# Patient Record
Sex: Female | Born: 1940
Health system: Southern US, Community
[De-identification: ages and names within clinical notes are randomized; demographics above are authoritative.]

## PROBLEM LIST (undated history)

## (undated) DIAGNOSIS — D649 Anemia, unspecified: Secondary | ICD-10-CM

## (undated) DIAGNOSIS — H532 Diplopia: Secondary | ICD-10-CM

## (undated) DIAGNOSIS — G20A1 Parkinson's disease without dyskinesia, without mention of fluctuations: Secondary | ICD-10-CM

## (undated) DIAGNOSIS — E785 Hyperlipidemia, unspecified: Secondary | ICD-10-CM

## (undated) DIAGNOSIS — D472 Monoclonal gammopathy: Secondary | ICD-10-CM

## (undated) DIAGNOSIS — C4490 Unspecified malignant neoplasm of skin, unspecified: Secondary | ICD-10-CM

## (undated) DIAGNOSIS — I341 Nonrheumatic mitral (valve) prolapse: Secondary | ICD-10-CM

## (undated) DIAGNOSIS — I720 Aneurysm of carotid artery: Secondary | ICD-10-CM

## (undated) DIAGNOSIS — T7840XA Allergy, unspecified, initial encounter: Secondary | ICD-10-CM

## (undated) DIAGNOSIS — M1990 Unspecified osteoarthritis, unspecified site: Secondary | ICD-10-CM

## (undated) DIAGNOSIS — C50919 Malignant neoplasm of unspecified site of unspecified female breast: Secondary | ICD-10-CM

## (undated) DIAGNOSIS — H547 Unspecified visual loss: Secondary | ICD-10-CM

## (undated) DIAGNOSIS — G20 Parkinson's disease: Secondary | ICD-10-CM

## (undated) HISTORY — DX: Unspecified osteoarthritis, unspecified site: M19.90

## (undated) HISTORY — DX: Anemia, unspecified: D64.9

## (undated) HISTORY — DX: Diplopia: H53.2

## (undated) HISTORY — DX: Malignant neoplasm of unspecified site of unspecified female breast: C50.919

## (undated) HISTORY — DX: Monoclonal gammopathy: D47.2

## (undated) HISTORY — DX: Aneurysm of carotid artery: I72.0

## (undated) HISTORY — PX: UPPER GASTROINTESTINAL ENDOSCOPY: SHX188

## (undated) HISTORY — PX: COLONOSCOPY: SHX174

## (undated) HISTORY — DX: Allergy, unspecified, initial encounter: T78.40XA

## (undated) HISTORY — DX: Parkinson's disease: G20

## (undated) HISTORY — DX: Parkinson's disease without dyskinesia, without mention of fluctuations: G20.A1

## (undated) HISTORY — DX: Nonrheumatic mitral (valve) prolapse: I34.1

## (undated) HISTORY — DX: Hyperlipidemia, unspecified: E78.5

## (undated) HISTORY — PX: HEMORRHOID SURGERY: SHX153

## (undated) HISTORY — DX: Unspecified malignant neoplasm of skin, unspecified: C44.90

## (undated) HISTORY — DX: Unspecified visual loss: H54.7

## (undated) HISTORY — PX: TOTAL ABDOMINAL HYSTERECTOMY W/ BILATERAL SALPINGOOPHORECTOMY: SHX83

## (undated) HISTORY — PX: SHOULDER SURGERY: SHX246

---

## 1997-06-07 ENCOUNTER — Ambulatory Visit (HOSPITAL_COMMUNITY): Admission: RE | Admit: 1997-06-07 | Discharge: 1997-06-07 | Payer: Self-pay | Admitting: Neurosurgery

## 1999-05-20 ENCOUNTER — Encounter: Payer: Self-pay | Admitting: Gastroenterology

## 1999-05-20 ENCOUNTER — Ambulatory Visit (HOSPITAL_COMMUNITY): Admission: RE | Admit: 1999-05-20 | Discharge: 1999-05-20 | Payer: Self-pay | Admitting: Obstetrics & Gynecology

## 1999-06-24 ENCOUNTER — Other Ambulatory Visit: Admission: RE | Admit: 1999-06-24 | Discharge: 1999-06-24 | Payer: Self-pay | Admitting: Obstetrics and Gynecology

## 1999-07-01 ENCOUNTER — Encounter: Payer: Self-pay | Admitting: Gastroenterology

## 1999-07-01 ENCOUNTER — Other Ambulatory Visit: Admission: RE | Admit: 1999-07-01 | Discharge: 1999-07-01 | Payer: Self-pay | Admitting: Gastroenterology

## 1999-07-01 ENCOUNTER — Encounter (INDEPENDENT_AMBULATORY_CARE_PROVIDER_SITE_OTHER): Payer: Self-pay | Admitting: Specialist

## 1999-08-03 ENCOUNTER — Encounter: Payer: Self-pay | Admitting: Obstetrics and Gynecology

## 1999-08-03 ENCOUNTER — Ambulatory Visit (HOSPITAL_COMMUNITY): Admission: RE | Admit: 1999-08-03 | Discharge: 1999-08-03 | Payer: Self-pay | Admitting: Obstetrics and Gynecology

## 1999-10-12 ENCOUNTER — Ambulatory Visit (HOSPITAL_COMMUNITY): Admission: RE | Admit: 1999-10-12 | Discharge: 1999-10-12 | Payer: Self-pay | Admitting: Obstetrics and Gynecology

## 1999-10-12 ENCOUNTER — Encounter: Payer: Self-pay | Admitting: Obstetrics and Gynecology

## 1999-11-27 ENCOUNTER — Encounter: Payer: Self-pay | Admitting: Obstetrics and Gynecology

## 1999-12-02 ENCOUNTER — Inpatient Hospital Stay (HOSPITAL_COMMUNITY): Admission: RE | Admit: 1999-12-02 | Discharge: 1999-12-06 | Payer: Self-pay | Admitting: Obstetrics and Gynecology

## 1999-12-02 ENCOUNTER — Encounter (INDEPENDENT_AMBULATORY_CARE_PROVIDER_SITE_OTHER): Payer: Self-pay | Admitting: Specialist

## 2000-01-26 HISTORY — PX: APPENDECTOMY: SHX54

## 2000-01-26 HISTORY — PX: SALPINGOOPHORECTOMY: SHX82

## 2003-01-26 HISTORY — PX: CARDIAC CATHETERIZATION: SHX172

## 2003-01-29 ENCOUNTER — Inpatient Hospital Stay (HOSPITAL_BASED_OUTPATIENT_CLINIC_OR_DEPARTMENT_OTHER): Admission: RE | Admit: 2003-01-29 | Discharge: 2003-01-29 | Payer: Self-pay | Admitting: *Deleted

## 2004-07-07 ENCOUNTER — Ambulatory Visit: Payer: Self-pay | Admitting: Internal Medicine

## 2004-07-14 ENCOUNTER — Encounter: Payer: Self-pay | Admitting: Internal Medicine

## 2004-07-14 ENCOUNTER — Ambulatory Visit: Payer: Self-pay | Admitting: Internal Medicine

## 2004-08-28 ENCOUNTER — Ambulatory Visit: Payer: Self-pay | Admitting: Internal Medicine

## 2004-08-31 ENCOUNTER — Ambulatory Visit: Payer: Self-pay | Admitting: Internal Medicine

## 2004-09-15 ENCOUNTER — Ambulatory Visit: Payer: Self-pay | Admitting: Internal Medicine

## 2004-09-24 ENCOUNTER — Ambulatory Visit: Payer: Self-pay | Admitting: Internal Medicine

## 2004-10-08 ENCOUNTER — Ambulatory Visit (HOSPITAL_COMMUNITY): Admission: RE | Admit: 2004-10-08 | Discharge: 2004-10-08 | Payer: Self-pay | Admitting: Internal Medicine

## 2004-11-13 ENCOUNTER — Encounter (INDEPENDENT_AMBULATORY_CARE_PROVIDER_SITE_OTHER): Payer: Self-pay | Admitting: Specialist

## 2004-11-13 ENCOUNTER — Ambulatory Visit (HOSPITAL_COMMUNITY): Admission: RE | Admit: 2004-11-13 | Discharge: 2004-11-13 | Payer: Self-pay | Admitting: Internal Medicine

## 2004-11-13 ENCOUNTER — Ambulatory Visit: Payer: Self-pay | Admitting: Internal Medicine

## 2004-11-17 ENCOUNTER — Ambulatory Visit: Payer: Self-pay | Admitting: Internal Medicine

## 2004-11-25 ENCOUNTER — Ambulatory Visit: Payer: Self-pay | Admitting: Internal Medicine

## 2005-02-16 ENCOUNTER — Ambulatory Visit: Payer: Self-pay | Admitting: Internal Medicine

## 2005-06-11 ENCOUNTER — Ambulatory Visit: Payer: Self-pay | Admitting: Internal Medicine

## 2005-06-15 LAB — CBC WITH DIFFERENTIAL/PLATELET
BASO%: 0.6 % (ref 0.0–2.0)
Basophils Absolute: 0 10*3/uL (ref 0.0–0.1)
EOS%: 5.8 % (ref 0.0–7.0)
Eosinophils Absolute: 0.4 10*3/uL (ref 0.0–0.5)
HCT: 37.4 % (ref 34.8–46.6)
HGB: 12.7 g/dL (ref 11.6–15.9)
LYMPH%: 41.6 % (ref 14.0–48.0)
MCH: 33.3 pg (ref 26.0–34.0)
MCHC: 34.1 g/dL (ref 32.0–36.0)
MCV: 97.8 fL (ref 81.0–101.0)
MONO#: 0.3 10*3/uL (ref 0.1–0.9)
MONO%: 4.1 % (ref 0.0–13.0)
NEUT#: 3.4 10*3/uL (ref 1.5–6.5)
NEUT%: 47.9 % (ref 39.6–76.8)
Platelets: 230 10*3/uL (ref 145–400)
RBC: 3.82 10*6/uL (ref 3.70–5.32)
RDW: 13.7 % (ref 11.3–14.5)
WBC: 7.1 10*3/uL (ref 3.9–10.0)
lymph#: 3 10*3/uL (ref 0.9–3.3)

## 2005-06-16 LAB — COMPREHENSIVE METABOLIC PANEL
ALT: 11 U/L (ref 0–40)
AST: 24 U/L (ref 0–37)
Albumin: 4.2 g/dL (ref 3.5–5.2)
Alkaline Phosphatase: 68 U/L (ref 39–117)
BUN: 9 mg/dL (ref 6–23)
CO2: 28 mEq/L (ref 19–32)
Calcium: 9.6 mg/dL (ref 8.4–10.5)
Chloride: 103 mEq/L (ref 96–112)
Creatinine, Ser: 0.6 mg/dL (ref 0.4–1.2)
Glucose, Bld: 90 mg/dL (ref 70–99)
Potassium: 3.9 mEq/L (ref 3.5–5.3)
Sodium: 139 mEq/L (ref 135–145)
Total Bilirubin: 0.5 mg/dL (ref 0.3–1.2)
Total Protein: 7.8 g/dL (ref 6.0–8.3)

## 2005-06-16 LAB — IGG, IGA, IGM
IgA: 52 mg/dL — ABNORMAL LOW (ref 68–378)
IgG (Immunoglobin G), Serum: 1940 mg/dL — ABNORMAL HIGH (ref 694–1618)
IgM, Serum: 64 mg/dL (ref 60–263)

## 2005-06-16 LAB — BETA 2 MICROGLOBULIN, SERUM: Beta-2 Microglobulin: 1.69 mg/L (ref 1.01–1.73)

## 2005-06-16 LAB — LACTATE DEHYDROGENASE: LDH: 134 U/L (ref 94–250)

## 2005-09-09 ENCOUNTER — Ambulatory Visit: Payer: Self-pay | Admitting: Internal Medicine

## 2005-09-14 LAB — CBC WITH DIFFERENTIAL/PLATELET
BASO%: 1.5 % (ref 0.0–2.0)
Basophils Absolute: 0.1 10*3/uL (ref 0.0–0.1)
EOS%: 7.6 % — ABNORMAL HIGH (ref 0.0–7.0)
Eosinophils Absolute: 0.5 10*3/uL (ref 0.0–0.5)
HCT: 35.7 % (ref 34.8–46.6)
HGB: 12.5 g/dL (ref 11.6–15.9)
LYMPH%: 41.3 % (ref 14.0–48.0)
MCH: 33.7 pg (ref 26.0–34.0)
MCHC: 34.9 g/dL (ref 32.0–36.0)
MCV: 96.5 fL (ref 81.0–101.0)
MONO#: 0.4 10*3/uL (ref 0.1–0.9)
MONO%: 6.1 % (ref 0.0–13.0)
NEUT#: 2.7 10*3/uL (ref 1.5–6.5)
NEUT%: 43.5 % (ref 39.6–76.8)
Platelets: 233 10*3/uL (ref 145–400)
RBC: 3.7 10*6/uL (ref 3.70–5.32)
RDW: 11.4 % (ref 11.3–14.5)
WBC: 6.3 10*3/uL (ref 3.9–10.0)
lymph#: 2.6 10*3/uL (ref 0.9–3.3)

## 2005-09-15 LAB — COMPREHENSIVE METABOLIC PANEL
ALT: 13 U/L (ref 0–40)
AST: 21 U/L (ref 0–37)
Albumin: 3.9 g/dL (ref 3.5–5.2)
Alkaline Phosphatase: 66 U/L (ref 39–117)
BUN: 7 mg/dL (ref 6–23)
CO2: 28 mEq/L (ref 19–32)
Calcium: 9.6 mg/dL (ref 8.4–10.5)
Chloride: 107 mEq/L (ref 96–112)
Creatinine, Ser: 0.64 mg/dL (ref 0.40–1.20)
Glucose, Bld: 93 mg/dL (ref 70–99)
Potassium: 3.7 mEq/L (ref 3.5–5.3)
Sodium: 139 mEq/L (ref 135–145)
Total Bilirubin: 0.4 mg/dL (ref 0.3–1.2)
Total Protein: 7.5 g/dL (ref 6.0–8.3)

## 2005-09-15 LAB — BETA 2 MICROGLOBULIN, SERUM: Beta-2 Microglobulin: 1.16 mg/L (ref 1.01–1.73)

## 2005-09-15 LAB — IGG, IGA, IGM
IgA: 51 mg/dL — ABNORMAL LOW (ref 68–378)
IgG (Immunoglobin G), Serum: 1840 mg/dL — ABNORMAL HIGH (ref 694–1618)
IgM, Serum: 55 mg/dL — ABNORMAL LOW (ref 60–263)

## 2005-09-15 LAB — LACTATE DEHYDROGENASE: LDH: 134 U/L (ref 94–250)

## 2006-03-14 ENCOUNTER — Ambulatory Visit: Payer: Self-pay | Admitting: Internal Medicine

## 2006-03-17 LAB — CBC WITH DIFFERENTIAL/PLATELET
BASO%: 1.2 % (ref 0.0–2.0)
Basophils Absolute: 0.1 10*3/uL (ref 0.0–0.1)
EOS%: 6 % (ref 0.0–7.0)
Eosinophils Absolute: 0.4 10*3/uL (ref 0.0–0.5)
HCT: 39.9 % (ref 34.8–46.6)
HGB: 13.7 g/dL (ref 11.6–15.9)
LYMPH%: 42.9 % (ref 14.0–48.0)
MCH: 33 pg (ref 26.0–34.0)
MCHC: 34.3 g/dL (ref 32.0–36.0)
MCV: 96.3 fL (ref 81.0–101.0)
MONO#: 0.4 10*3/uL (ref 0.1–0.9)
MONO%: 5.8 % (ref 0.0–13.0)
NEUT#: 2.7 10*3/uL (ref 1.5–6.5)
NEUT%: 44.1 % (ref 39.6–76.8)
Platelets: 260 10*3/uL (ref 145–400)
RBC: 4.14 10*6/uL (ref 3.70–5.32)
RDW: 13.4 % (ref 11.3–14.5)
WBC: 6.2 10*3/uL (ref 3.9–10.0)
lymph#: 2.7 10*3/uL (ref 0.9–3.3)

## 2006-03-18 LAB — COMPREHENSIVE METABOLIC PANEL
ALT: 11 U/L (ref 0–35)
AST: 22 U/L (ref 0–37)
Albumin: 4.4 g/dL (ref 3.5–5.2)
Alkaline Phosphatase: 72 U/L (ref 39–117)
BUN: 6 mg/dL (ref 6–23)
CO2: 28 mEq/L (ref 19–32)
Calcium: 9.7 mg/dL (ref 8.4–10.5)
Chloride: 101 mEq/L (ref 96–112)
Creatinine, Ser: 0.8 mg/dL (ref 0.40–1.20)
Glucose, Bld: 91 mg/dL (ref 70–99)
Potassium: 3.9 mEq/L (ref 3.5–5.3)
Sodium: 140 mEq/L (ref 135–145)
Total Bilirubin: 0.6 mg/dL (ref 0.3–1.2)
Total Protein: 8.2 g/dL (ref 6.0–8.3)

## 2006-03-18 LAB — IGG, IGA, IGM
IgA: 68 mg/dL (ref 68–378)
IgG (Immunoglobin G), Serum: 2260 mg/dL — ABNORMAL HIGH (ref 694–1618)
IgM, Serum: 66 mg/dL (ref 60–263)

## 2006-03-18 LAB — LACTATE DEHYDROGENASE: LDH: 136 U/L (ref 94–250)

## 2006-03-18 LAB — BETA 2 MICROGLOBULIN, SERUM: Beta-2 Microglobulin: 1.55 mg/L (ref 1.01–1.73)

## 2006-04-22 ENCOUNTER — Ambulatory Visit: Payer: Self-pay | Admitting: Internal Medicine

## 2006-04-28 ENCOUNTER — Emergency Department (HOSPITAL_COMMUNITY): Admission: EM | Admit: 2006-04-28 | Discharge: 2006-04-28 | Payer: Self-pay | Admitting: Emergency Medicine

## 2006-05-01 ENCOUNTER — Emergency Department (HOSPITAL_COMMUNITY): Admission: EM | Admit: 2006-05-01 | Discharge: 2006-05-01 | Payer: Self-pay | Admitting: Emergency Medicine

## 2006-05-11 ENCOUNTER — Ambulatory Visit: Payer: Self-pay | Admitting: Internal Medicine

## 2006-05-11 LAB — CONVERTED CEMR LAB
Hgb A1c MFr Bld: 6 % (ref 4.6–6.0)
TSH: 1.46 microintl units/mL (ref 0.35–5.50)

## 2006-07-11 ENCOUNTER — Ambulatory Visit: Payer: Self-pay | Admitting: Internal Medicine

## 2006-07-13 LAB — CBC WITH DIFFERENTIAL/PLATELET
BASO%: 1.1 % (ref 0.0–2.0)
Basophils Absolute: 0.1 10*3/uL (ref 0.0–0.1)
EOS%: 6.3 % (ref 0.0–7.0)
Eosinophils Absolute: 0.4 10*3/uL (ref 0.0–0.5)
HCT: 36.5 % (ref 34.8–46.6)
HGB: 12.7 g/dL (ref 11.6–15.9)
LYMPH%: 42.8 % (ref 14.0–48.0)
MCH: 33.4 pg (ref 26.0–34.0)
MCHC: 34.8 g/dL (ref 32.0–36.0)
MCV: 96.1 fL (ref 81.0–101.0)
MONO#: 0.3 10*3/uL (ref 0.1–0.9)
MONO%: 5.1 % (ref 0.0–13.0)
NEUT#: 2.8 10*3/uL (ref 1.5–6.5)
NEUT%: 44.7 % (ref 39.6–76.8)
Platelets: 251 10*3/uL (ref 145–400)
RBC: 3.8 10*6/uL (ref 3.70–5.32)
RDW: 14.1 % (ref 11.3–14.5)
WBC: 6.2 10*3/uL (ref 3.9–10.0)
lymph#: 2.6 10*3/uL (ref 0.9–3.3)

## 2006-07-14 LAB — IGG, IGA, IGM
IgA: 65 mg/dL — ABNORMAL LOW (ref 68–378)
IgG (Immunoglobin G), Serum: 2010 mg/dL — ABNORMAL HIGH (ref 694–1618)
IgM, Serum: 59 mg/dL — ABNORMAL LOW (ref 60–263)

## 2006-07-14 LAB — COMPREHENSIVE METABOLIC PANEL
ALT: 15 U/L (ref 0–35)
AST: 22 U/L (ref 0–37)
Albumin: 4.1 g/dL (ref 3.5–5.2)
Alkaline Phosphatase: 82 U/L (ref 39–117)
BUN: 8 mg/dL (ref 6–23)
CO2: 25 mEq/L (ref 19–32)
Calcium: 9.5 mg/dL (ref 8.4–10.5)
Chloride: 104 mEq/L (ref 96–112)
Creatinine, Ser: 0.71 mg/dL (ref 0.40–1.20)
Glucose, Bld: 87 mg/dL (ref 70–99)
Potassium: 4.1 mEq/L (ref 3.5–5.3)
Sodium: 141 mEq/L (ref 135–145)
Total Bilirubin: 0.5 mg/dL (ref 0.3–1.2)
Total Protein: 7.8 g/dL (ref 6.0–8.3)

## 2006-07-14 LAB — LACTATE DEHYDROGENASE: LDH: 142 U/L (ref 94–250)

## 2006-07-14 LAB — BETA 2 MICROGLOBULIN, SERUM: Beta-2 Microglobulin: 1.21 mg/L (ref 1.01–1.73)

## 2006-07-20 ENCOUNTER — Ambulatory Visit (HOSPITAL_COMMUNITY): Admission: RE | Admit: 2006-07-20 | Discharge: 2006-07-20 | Payer: Self-pay | Admitting: Internal Medicine

## 2006-08-26 ENCOUNTER — Ambulatory Visit: Payer: Self-pay | Admitting: Internal Medicine

## 2006-08-26 LAB — CONVERTED CEMR LAB
BUN: 4 mg/dL — ABNORMAL LOW (ref 6–23)
Creatinine, Ser: 0.7 mg/dL (ref 0.4–1.2)

## 2006-09-02 ENCOUNTER — Ambulatory Visit: Payer: Self-pay | Admitting: Cardiology

## 2006-09-08 ENCOUNTER — Encounter (INDEPENDENT_AMBULATORY_CARE_PROVIDER_SITE_OTHER): Payer: Self-pay | Admitting: *Deleted

## 2006-09-23 ENCOUNTER — Ambulatory Visit: Payer: Self-pay | Admitting: Internal Medicine

## 2006-09-23 ENCOUNTER — Telehealth (INDEPENDENT_AMBULATORY_CARE_PROVIDER_SITE_OTHER): Payer: Self-pay | Admitting: *Deleted

## 2006-10-11 ENCOUNTER — Ambulatory Visit: Payer: Self-pay | Admitting: Internal Medicine

## 2006-10-13 LAB — CBC WITH DIFFERENTIAL/PLATELET
BASO%: 1.3 % (ref 0.0–2.0)
Basophils Absolute: 0.1 10*3/uL (ref 0.0–0.1)
EOS%: 2.7 % (ref 0.0–7.0)
Eosinophils Absolute: 0.1 10*3/uL (ref 0.0–0.5)
HCT: 33.5 % — ABNORMAL LOW (ref 34.8–46.6)
HGB: 11.5 g/dL — ABNORMAL LOW (ref 11.6–15.9)
LYMPH%: 47.7 % (ref 14.0–48.0)
MCH: 32.9 pg (ref 26.0–34.0)
MCHC: 34.2 g/dL (ref 32.0–36.0)
MCV: 96.3 fL (ref 81.0–101.0)
MONO#: 0.2 10*3/uL (ref 0.1–0.9)
MONO%: 4.5 % (ref 0.0–13.0)
NEUT#: 1.9 10*3/uL (ref 1.5–6.5)
NEUT%: 43.8 % (ref 39.6–76.8)
Platelets: 272 10*3/uL (ref 145–400)
RBC: 3.48 10*6/uL — ABNORMAL LOW (ref 3.70–5.32)
RDW: 14.2 % (ref 11.3–14.5)
WBC: 4.4 10*3/uL (ref 3.9–10.0)
lymph#: 2.1 10*3/uL (ref 0.9–3.3)

## 2006-10-18 LAB — COMPREHENSIVE METABOLIC PANEL
ALT: 11 U/L (ref 0–35)
AST: 26 U/L (ref 0–37)
Albumin: 3.8 g/dL (ref 3.5–5.2)
Alkaline Phosphatase: 81 U/L (ref 39–117)
BUN: 4 mg/dL — ABNORMAL LOW (ref 6–23)
CO2: 25 mEq/L (ref 19–32)
Calcium: 8.9 mg/dL (ref 8.4–10.5)
Chloride: 106 mEq/L (ref 96–112)
Creatinine, Ser: 0.67 mg/dL (ref 0.40–1.20)
Glucose, Bld: 107 mg/dL — ABNORMAL HIGH (ref 70–99)
Potassium: 3.7 mEq/L (ref 3.5–5.3)
Sodium: 140 mEq/L (ref 135–145)
Total Bilirubin: 0.5 mg/dL (ref 0.3–1.2)
Total Protein: 7.3 g/dL (ref 6.0–8.3)

## 2006-10-18 LAB — LACTATE DEHYDROGENASE: LDH: 149 U/L (ref 94–250)

## 2006-10-18 LAB — IGG, IGA, IGM
IgA: 55 mg/dL — ABNORMAL LOW (ref 68–378)
IgG (Immunoglobin G), Serum: 2000 mg/dL — ABNORMAL HIGH (ref 694–1618)
IgM, Serum: 60 mg/dL (ref 60–263)

## 2006-10-18 LAB — KAPPA/LAMBDA LIGHT CHAINS
Kappa free light chain: 3.28 mg/dL — ABNORMAL HIGH (ref 0.33–1.94)
Lambda Free Lght Chn: <0.8 mg/dL (ref 0.57–2.63)

## 2006-10-18 LAB — BETA 2 MICROGLOBULIN, SERUM: Beta-2 Microglobulin: 1.33 mg/L (ref 1.01–1.73)

## 2006-10-20 ENCOUNTER — Encounter: Payer: Self-pay | Admitting: Internal Medicine

## 2007-01-09 ENCOUNTER — Telehealth: Payer: Self-pay | Admitting: Internal Medicine

## 2007-01-10 ENCOUNTER — Ambulatory Visit: Payer: Self-pay | Admitting: Internal Medicine

## 2007-01-10 LAB — CONVERTED CEMR LAB
Bilirubin Urine: NEGATIVE
Glucose, Urine, Semiquant: NEGATIVE
Ketones, urine, test strip: NEGATIVE
Nitrite: NEGATIVE
Protein, U semiquant: NEGATIVE
Specific Gravity, Urine: 1.02
Urobilinogen, UA: NEGATIVE
WBC Urine, dipstick: NEGATIVE
pH: 6.5

## 2007-01-12 ENCOUNTER — Encounter: Payer: Self-pay | Admitting: Internal Medicine

## 2007-01-13 ENCOUNTER — Ambulatory Visit: Payer: Self-pay | Admitting: Internal Medicine

## 2007-01-17 ENCOUNTER — Telehealth (INDEPENDENT_AMBULATORY_CARE_PROVIDER_SITE_OTHER): Payer: Self-pay | Admitting: *Deleted

## 2007-01-31 LAB — CBC WITH DIFFERENTIAL/PLATELET
BASO%: 1.4 % (ref 0.0–2.0)
Basophils Absolute: 0.1 10*3/uL (ref 0.0–0.1)
EOS%: 4.7 % (ref 0.0–7.0)
Eosinophils Absolute: 0.3 10*3/uL (ref 0.0–0.5)
HCT: 36.1 % (ref 34.8–46.6)
HGB: 12.4 g/dL (ref 11.6–15.9)
LYMPH%: 46.2 % (ref 14.0–48.0)
MCH: 32.7 pg (ref 26.0–34.0)
MCHC: 34.2 g/dL (ref 32.0–36.0)
MCV: 95.5 fL (ref 81.0–101.0)
MONO#: 0.3 10*3/uL (ref 0.1–0.9)
MONO%: 5.7 % (ref 0.0–13.0)
NEUT#: 2.2 10*3/uL (ref 1.5–6.5)
NEUT%: 42 % (ref 39.6–76.8)
Platelets: 268 10*3/uL (ref 145–400)
RBC: 3.79 10*6/uL (ref 3.70–5.32)
RDW: 14.1 % (ref 11.3–14.5)
WBC: 5.3 10*3/uL (ref 3.9–10.0)
lymph#: 2.5 10*3/uL (ref 0.9–3.3)

## 2007-02-02 LAB — COMPREHENSIVE METABOLIC PANEL
ALT: 13 U/L (ref 0–35)
AST: 21 U/L (ref 0–37)
Albumin: 4.1 g/dL (ref 3.5–5.2)
Alkaline Phosphatase: 87 U/L (ref 39–117)
BUN: 10 mg/dL (ref 6–23)
CO2: 27 mEq/L (ref 19–32)
Calcium: 9.4 mg/dL (ref 8.4–10.5)
Chloride: 104 mEq/L (ref 96–112)
Creatinine, Ser: 0.71 mg/dL (ref 0.40–1.20)
Glucose, Bld: 95 mg/dL (ref 70–99)
Potassium: 4.1 mEq/L (ref 3.5–5.3)
Sodium: 143 mEq/L (ref 135–145)
Total Bilirubin: 0.5 mg/dL (ref 0.3–1.2)
Total Protein: 7.9 g/dL (ref 6.0–8.3)

## 2007-02-02 LAB — KAPPA/LAMBDA LIGHT CHAINS
Kappa free light chain: 2.88 mg/dL — ABNORMAL HIGH (ref 0.33–1.94)
Kappa:Lambda Ratio: 2.36 — ABNORMAL HIGH (ref 0.26–1.65)
Lambda Free Lght Chn: 1.22 mg/dL (ref 0.57–2.63)

## 2007-02-02 LAB — IMMUNOFIXATION ELECTROPHORESIS
IgA: 59 mg/dL — ABNORMAL LOW (ref 68–378)
IgG (Immunoglobin G), Serum: 2130 mg/dL — ABNORMAL HIGH (ref 694–1618)
IgM, Serum: 65 mg/dL (ref 60–263)
Total Protein, Serum Electrophoresis: 7.9 g/dL (ref 6.0–8.3)

## 2007-02-02 LAB — BETA 2 MICROGLOBULIN, SERUM: Beta-2 Microglobulin: 1.4 mg/L (ref 1.01–1.73)

## 2007-02-02 LAB — LACTATE DEHYDROGENASE: LDH: 149 U/L (ref 94–250)

## 2007-02-07 ENCOUNTER — Ambulatory Visit (HOSPITAL_COMMUNITY): Admission: RE | Admit: 2007-02-07 | Discharge: 2007-02-07 | Payer: Self-pay | Admitting: Internal Medicine

## 2007-04-27 ENCOUNTER — Ambulatory Visit: Payer: Self-pay | Admitting: Internal Medicine

## 2007-05-01 LAB — CBC WITH DIFFERENTIAL/PLATELET
BASO%: 0.3 % (ref 0.0–2.0)
Basophils Absolute: 0 10*3/uL (ref 0.0–0.1)
EOS%: 3.9 % (ref 0.0–7.0)
Eosinophils Absolute: 0.2 10*3/uL (ref 0.0–0.5)
HCT: 36.5 % (ref 34.8–46.6)
HGB: 12.8 g/dL (ref 11.6–15.9)
LYMPH%: 41.4 % (ref 14.0–48.0)
MCH: 33.4 pg (ref 26.0–34.0)
MCHC: 35 g/dL (ref 32.0–36.0)
MCV: 95.2 fL (ref 81.0–101.0)
MONO#: 0.2 10*3/uL (ref 0.1–0.9)
MONO%: 4.8 % (ref 0.0–13.0)
NEUT#: 2.6 10*3/uL (ref 1.5–6.5)
NEUT%: 49.6 % (ref 39.6–76.8)
Platelets: 224 10*3/uL (ref 145–400)
RBC: 3.84 10*6/uL (ref 3.70–5.32)
RDW: 13.9 % (ref 11.3–14.5)
WBC: 5.2 10*3/uL (ref 3.9–10.0)
lymph#: 2.1 10*3/uL (ref 0.9–3.3)

## 2007-05-02 LAB — COMPREHENSIVE METABOLIC PANEL
ALT: 8 U/L (ref 0–35)
AST: 16 U/L (ref 0–37)
Albumin: 4.3 g/dL (ref 3.5–5.2)
Alkaline Phosphatase: 61 U/L (ref 39–117)
BUN: 9 mg/dL (ref 6–23)
CO2: 25 mEq/L (ref 19–32)
Calcium: 9.1 mg/dL (ref 8.4–10.5)
Chloride: 106 mEq/L (ref 96–112)
Creatinine, Ser: 0.74 mg/dL (ref 0.40–1.20)
Glucose, Bld: 89 mg/dL (ref 70–99)
Potassium: 3.6 mEq/L (ref 3.5–5.3)
Sodium: 143 mEq/L (ref 135–145)
Total Bilirubin: 0.5 mg/dL (ref 0.3–1.2)
Total Protein: 7.5 g/dL (ref 6.0–8.3)

## 2007-05-02 LAB — KAPPA/LAMBDA LIGHT CHAINS
Kappa free light chain: 3.07 mg/dL — ABNORMAL HIGH (ref 0.33–1.94)
Kappa:Lambda Ratio: 2.77 — ABNORMAL HIGH (ref 0.26–1.65)
Lambda Free Lght Chn: 1.11 mg/dL (ref 0.57–2.63)

## 2007-05-02 LAB — LACTATE DEHYDROGENASE: LDH: 133 U/L (ref 94–250)

## 2007-05-02 LAB — IGG, IGA, IGM
IgA: 48 mg/dL — ABNORMAL LOW (ref 68–378)
IgG (Immunoglobin G), Serum: 2080 mg/dL — ABNORMAL HIGH (ref 694–1618)
IgM, Serum: 57 mg/dL — ABNORMAL LOW (ref 60–263)

## 2007-05-02 LAB — BETA 2 MICROGLOBULIN, SERUM: Beta-2 Microglobulin: 1.37 mg/L (ref 1.01–1.73)

## 2007-07-10 ENCOUNTER — Ambulatory Visit: Payer: Self-pay | Admitting: Internal Medicine

## 2007-07-10 DIAGNOSIS — D472 Monoclonal gammopathy: Secondary | ICD-10-CM | POA: Insufficient documentation

## 2007-07-10 DIAGNOSIS — R5383 Other fatigue: Secondary | ICD-10-CM

## 2007-07-10 DIAGNOSIS — R5381 Other malaise: Secondary | ICD-10-CM | POA: Insufficient documentation

## 2007-07-10 DIAGNOSIS — E049 Nontoxic goiter, unspecified: Secondary | ICD-10-CM | POA: Insufficient documentation

## 2007-07-10 DIAGNOSIS — E785 Hyperlipidemia, unspecified: Secondary | ICD-10-CM | POA: Insufficient documentation

## 2007-07-10 DIAGNOSIS — D649 Anemia, unspecified: Secondary | ICD-10-CM | POA: Insufficient documentation

## 2007-07-10 DIAGNOSIS — J069 Acute upper respiratory infection, unspecified: Secondary | ICD-10-CM | POA: Insufficient documentation

## 2007-07-12 ENCOUNTER — Ambulatory Visit: Payer: Self-pay | Admitting: Internal Medicine

## 2007-07-13 ENCOUNTER — Encounter: Admission: RE | Admit: 2007-07-13 | Discharge: 2007-07-13 | Payer: Self-pay | Admitting: Internal Medicine

## 2007-07-25 ENCOUNTER — Encounter (INDEPENDENT_AMBULATORY_CARE_PROVIDER_SITE_OTHER): Payer: Self-pay | Admitting: *Deleted

## 2007-07-25 ENCOUNTER — Telehealth (INDEPENDENT_AMBULATORY_CARE_PROVIDER_SITE_OTHER): Payer: Self-pay | Admitting: *Deleted

## 2007-07-25 LAB — CONVERTED CEMR LAB
ALT: 17 units/L (ref 0–35)
AST: 25 units/L (ref 0–37)
Albumin: 3.6 g/dL (ref 3.5–5.2)
Alkaline Phosphatase: 67 units/L (ref 39–117)
BUN: 7 mg/dL (ref 6–23)
Basophils Absolute: 0 10*3/uL (ref 0.0–0.1)
Basophils Relative: 0.6 % (ref 0.0–1.0)
Bilirubin, Direct: 0.1 mg/dL (ref 0.0–0.3)
CO2: 32 meq/L (ref 19–32)
Calcium: 9 mg/dL (ref 8.4–10.5)
Chloride: 108 meq/L (ref 96–112)
Cholesterol: 196 mg/dL (ref 0–200)
Creatinine, Ser: 0.7 mg/dL (ref 0.4–1.2)
Eosinophils Absolute: 0.3 10*3/uL (ref 0.0–0.7)
Eosinophils Relative: 4.9 % (ref 0.0–5.0)
Free T4: 0.7 ng/dL (ref 0.6–1.6)
GFR calc Af Amer: 108 mL/min
GFR calc non Af Amer: 89 mL/min
Glucose, Bld: 98 mg/dL (ref 70–99)
HCT: 38.1 % (ref 36.0–46.0)
HDL: 69.2 mg/dL (ref >39.0)
Hemoglobin: 13 g/dL (ref 12.0–15.0)
LDL Cholesterol: 113 mg/dL — ABNORMAL HIGH (ref 0–99)
Lymphocytes Relative: 33.8 % (ref 12.0–46.0)
MCHC: 34.2 g/dL (ref 30.0–36.0)
MCV: 99.4 fL (ref 78.0–100.0)
Monocytes Absolute: 0.3 10*3/uL (ref 0.1–1.0)
Monocytes Relative: 5 % (ref 3.0–12.0)
Neutro Abs: 3.3 10*3/uL (ref 1.4–7.7)
Neutrophils Relative %: 55.7 % (ref 43.0–77.0)
Platelets: 271 10*3/uL (ref 150–400)
Potassium: 4.1 meq/L (ref 3.5–5.1)
RBC: 3.83 M/uL — ABNORMAL LOW (ref 3.87–5.11)
RDW: 13.1 % (ref 11.5–14.6)
Sodium: 143 meq/L (ref 135–145)
TSH: 6.98 microintl units/mL — ABNORMAL HIGH (ref 0.35–5.50)
Total Bilirubin: 0.8 mg/dL (ref 0.3–1.2)
Total CHOL/HDL Ratio: 2.8
Total Protein: 7.2 g/dL (ref 6.0–8.3)
Triglycerides: 70 mg/dL (ref 0–149)
VLDL: 14 mg/dL (ref 0–40)
WBC: 5.9 10*3/uL (ref 4.5–10.5)

## 2007-07-26 ENCOUNTER — Ambulatory Visit: Payer: Self-pay | Admitting: Internal Medicine

## 2007-07-31 LAB — CBC WITH DIFFERENTIAL/PLATELET
BASO%: 1.8 % (ref 0.0–2.0)
Basophils Absolute: 0.1 10*3/uL (ref 0.0–0.1)
EOS%: 3.8 % (ref 0.0–7.0)
Eosinophils Absolute: 0.3 10*3/uL (ref 0.0–0.5)
HCT: 37.1 % (ref 34.8–46.6)
HGB: 12.8 g/dL (ref 11.6–15.9)
LYMPH%: 37.2 % (ref 14.0–48.0)
MCH: 33.3 pg (ref 26.0–34.0)
MCHC: 34.4 g/dL (ref 32.0–36.0)
MCV: 96.9 fL (ref 81.0–101.0)
MONO#: 0.4 10*3/uL (ref 0.1–0.9)
MONO%: 5.3 % (ref 0.0–13.0)
NEUT#: 3.5 10*3/uL (ref 1.5–6.5)
NEUT%: 51.9 % (ref 39.6–76.8)
Platelets: 254 10*3/uL (ref 145–400)
RBC: 3.83 10*6/uL (ref 3.70–5.32)
RDW: 12.8 % (ref 11.3–14.5)
WBC: 6.8 10*3/uL (ref 3.9–10.0)
lymph#: 2.5 10*3/uL (ref 0.9–3.3)

## 2007-08-01 LAB — COMPREHENSIVE METABOLIC PANEL
ALT: 12 U/L (ref 0–35)
AST: 18 U/L (ref 0–37)
Albumin: 4.2 g/dL (ref 3.5–5.2)
Alkaline Phosphatase: 75 U/L (ref 39–117)
BUN: 7 mg/dL (ref 6–23)
CO2: 26 mEq/L (ref 19–32)
Calcium: 9.3 mg/dL (ref 8.4–10.5)
Chloride: 106 mEq/L (ref 96–112)
Creatinine, Ser: 0.67 mg/dL (ref 0.40–1.20)
Glucose, Bld: 106 mg/dL — ABNORMAL HIGH (ref 70–99)
Potassium: 3.7 mEq/L (ref 3.5–5.3)
Sodium: 142 mEq/L (ref 135–145)
Total Bilirubin: 0.5 mg/dL (ref 0.3–1.2)
Total Protein: 7.6 g/dL (ref 6.0–8.3)

## 2007-08-01 LAB — KAPPA/LAMBDA LIGHT CHAINS
Kappa free light chain: 2.96 mg/dL — ABNORMAL HIGH (ref 0.33–1.94)
Kappa:Lambda Ratio: 3.4 — ABNORMAL HIGH (ref 0.26–1.65)
Lambda Free Lght Chn: 0.87 mg/dL (ref 0.57–2.63)

## 2007-08-01 LAB — IGG, IGA, IGM
IgA: 55 mg/dL — ABNORMAL LOW (ref 68–378)
IgG (Immunoglobin G), Serum: 2120 mg/dL — ABNORMAL HIGH (ref 694–1618)
IgM, Serum: 65 mg/dL (ref 60–263)

## 2007-08-01 LAB — BETA 2 MICROGLOBULIN, SERUM: Beta-2 Microglobulin: 1.39 mg/L (ref 1.01–1.73)

## 2007-08-07 ENCOUNTER — Encounter: Payer: Self-pay | Admitting: Internal Medicine

## 2007-12-06 ENCOUNTER — Ambulatory Visit: Payer: Self-pay | Admitting: Internal Medicine

## 2007-12-08 LAB — CBC WITH DIFFERENTIAL/PLATELET
BASO%: 0.7 % (ref 0.0–2.0)
Basophils Absolute: 0 10*3/uL (ref 0.0–0.1)
EOS%: 4.6 % (ref 0.0–7.0)
Eosinophils Absolute: 0.2 10*3/uL (ref 0.0–0.5)
HCT: 37.9 % (ref 34.8–46.6)
HGB: 12.9 g/dL (ref 11.6–15.9)
LYMPH%: 38.7 % (ref 14.0–48.0)
MCH: 33.1 pg (ref 26.0–34.0)
MCHC: 34 g/dL (ref 32.0–36.0)
MCV: 97.5 fL (ref 81.0–101.0)
MONO#: 0.3 10*3/uL (ref 0.1–0.9)
MONO%: 6.2 % (ref 0.0–13.0)
NEUT#: 2.4 10*3/uL (ref 1.5–6.5)
NEUT%: 49.8 % (ref 39.6–76.8)
Platelets: 238 10*3/uL (ref 145–400)
RBC: 3.89 10*6/uL (ref 3.70–5.32)
RDW: 13.7 % (ref 11.3–14.5)
WBC: 4.9 10*3/uL (ref 3.9–10.0)
lymph#: 1.9 10*3/uL (ref 0.9–3.3)

## 2007-12-11 LAB — LACTATE DEHYDROGENASE: LDH: 139 U/L (ref 94–250)

## 2007-12-11 LAB — COMPREHENSIVE METABOLIC PANEL
ALT: 10 U/L (ref 0–35)
AST: 19 U/L (ref 0–37)
Albumin: 4.1 g/dL (ref 3.5–5.2)
Alkaline Phosphatase: 70 U/L (ref 39–117)
BUN: 10 mg/dL (ref 6–23)
CO2: 20 mEq/L (ref 19–32)
Calcium: 9.3 mg/dL (ref 8.4–10.5)
Chloride: 106 mEq/L (ref 96–112)
Creatinine, Ser: 0.59 mg/dL (ref 0.40–1.20)
Glucose, Bld: 100 mg/dL — ABNORMAL HIGH (ref 70–99)
Potassium: 4 mEq/L (ref 3.5–5.3)
Sodium: 141 mEq/L (ref 135–145)
Total Bilirubin: 0.6 mg/dL (ref 0.3–1.2)
Total Protein: 7.2 g/dL (ref 6.0–8.3)

## 2007-12-11 LAB — IGG, IGA, IGM
IgA: 58 mg/dL — ABNORMAL LOW (ref 68–378)
IgG (Immunoglobin G), Serum: 1920 mg/dL — ABNORMAL HIGH (ref 694–1618)
IgM, Serum: 55 mg/dL — ABNORMAL LOW (ref 60–263)

## 2007-12-11 LAB — KAPPA/LAMBDA LIGHT CHAINS
Kappa free light chain: 6.32 mg/dL — ABNORMAL HIGH (ref 0.33–1.94)
Kappa:Lambda Ratio: 6.38 — ABNORMAL HIGH (ref 0.26–1.65)
Lambda Free Lght Chn: 0.99 mg/dL (ref 0.57–2.63)

## 2007-12-11 LAB — BETA 2 MICROGLOBULIN, SERUM: Beta-2 Microglobulin: 1.14 mg/L (ref 1.01–1.73)

## 2007-12-15 ENCOUNTER — Encounter: Payer: Self-pay | Admitting: Internal Medicine

## 2008-04-01 ENCOUNTER — Ambulatory Visit: Payer: Self-pay | Admitting: Internal Medicine

## 2008-04-05 LAB — CBC WITH DIFFERENTIAL/PLATELET
BASO%: 0.6 % (ref 0.0–2.0)
Basophils Absolute: 0 10*3/uL (ref 0.0–0.1)
EOS%: 2.4 % (ref 0.0–7.0)
Eosinophils Absolute: 0.1 10*3/uL (ref 0.0–0.5)
HCT: 38.7 % (ref 34.8–46.6)
HGB: 13 g/dL (ref 11.6–15.9)
LYMPH%: 49.4 % (ref 14.0–49.7)
MCH: 32.9 pg (ref 25.1–34.0)
MCHC: 33.7 g/dL (ref 31.5–36.0)
MCV: 97.5 fL (ref 79.5–101.0)
MONO#: 0.4 10*3/uL (ref 0.1–0.9)
MONO%: 10.3 % (ref 0.0–14.0)
NEUT#: 1.5 10*3/uL (ref 1.5–6.5)
NEUT%: 37.3 % — ABNORMAL LOW (ref 38.4–76.8)
Platelets: 288 10*3/uL (ref 145–400)
RBC: 3.96 10*6/uL (ref 3.70–5.45)
RDW: 14.3 % (ref 11.2–14.5)
WBC: 4.1 10*3/uL (ref 3.9–10.3)
lymph#: 2 10*3/uL (ref 0.9–3.3)

## 2008-04-08 LAB — KAPPA/LAMBDA LIGHT CHAINS
Kappa free light chain: 4.66 mg/dL — ABNORMAL HIGH (ref 0.33–1.94)
Kappa:Lambda Ratio: 5.97 — ABNORMAL HIGH (ref 0.26–1.65)
Lambda Free Lght Chn: 0.78 mg/dL (ref 0.57–2.63)

## 2008-04-08 LAB — COMPREHENSIVE METABOLIC PANEL
ALT: 8 U/L (ref 0–35)
AST: 14 U/L (ref 0–37)
Albumin: 3.9 g/dL (ref 3.5–5.2)
Alkaline Phosphatase: 95 U/L (ref 39–117)
BUN: 7 mg/dL (ref 6–23)
CO2: 27 mEq/L (ref 19–32)
Calcium: 9.1 mg/dL (ref 8.4–10.5)
Chloride: 107 mEq/L (ref 96–112)
Creatinine, Ser: 0.73 mg/dL (ref 0.40–1.20)
Glucose, Bld: 88 mg/dL (ref 70–99)
Potassium: 4.6 mEq/L (ref 3.5–5.3)
Sodium: 142 mEq/L (ref 135–145)
Total Bilirubin: 0.3 mg/dL (ref 0.3–1.2)
Total Protein: 7.5 g/dL (ref 6.0–8.3)

## 2008-04-08 LAB — IGG, IGA, IGM
IgA: 60 mg/dL — ABNORMAL LOW (ref 68–378)
IgG (Immunoglobin G), Serum: 1880 mg/dL — ABNORMAL HIGH (ref 694–1618)
IgM, Serum: 61 mg/dL (ref 60–263)

## 2008-04-08 LAB — BETA 2 MICROGLOBULIN, SERUM: Beta-2 Microglobulin: 2.27 mg/L — ABNORMAL HIGH (ref 1.01–1.73)

## 2008-04-08 LAB — LACTATE DEHYDROGENASE: LDH: 120 U/L (ref 94–250)

## 2008-05-01 ENCOUNTER — Encounter: Payer: Self-pay | Admitting: Internal Medicine

## 2008-06-28 ENCOUNTER — Ambulatory Visit: Payer: Self-pay | Admitting: Internal Medicine

## 2008-06-28 DIAGNOSIS — R1319 Other dysphagia: Secondary | ICD-10-CM | POA: Insufficient documentation

## 2008-06-28 DIAGNOSIS — R143 Flatulence: Secondary | ICD-10-CM

## 2008-06-28 DIAGNOSIS — R142 Eructation: Secondary | ICD-10-CM

## 2008-06-28 DIAGNOSIS — R350 Frequency of micturition: Secondary | ICD-10-CM | POA: Insufficient documentation

## 2008-06-28 DIAGNOSIS — I951 Orthostatic hypotension: Secondary | ICD-10-CM | POA: Insufficient documentation

## 2008-06-28 DIAGNOSIS — R141 Gas pain: Secondary | ICD-10-CM | POA: Insufficient documentation

## 2008-06-28 DIAGNOSIS — R42 Dizziness and giddiness: Secondary | ICD-10-CM | POA: Insufficient documentation

## 2008-06-28 DIAGNOSIS — R1031 Right lower quadrant pain: Secondary | ICD-10-CM | POA: Insufficient documentation

## 2008-07-02 ENCOUNTER — Telehealth (INDEPENDENT_AMBULATORY_CARE_PROVIDER_SITE_OTHER): Payer: Self-pay | Admitting: *Deleted

## 2008-07-03 LAB — CONVERTED CEMR LAB
Basophils Absolute: 0 10*3/uL (ref 0.0–0.1)
Basophils Relative: 0.6 % (ref 0.0–3.0)
Eosinophils Absolute: 0.3 10*3/uL (ref 0.0–0.7)
Eosinophils Relative: 4.4 % (ref 0.0–5.0)
HCT: 37.6 % (ref 36.0–46.0)
Hemoglobin: 12.9 g/dL (ref 12.0–15.0)
Lymphocytes Relative: 42.1 % (ref 12.0–46.0)
Lymphs Abs: 2.5 10*3/uL (ref 0.7–4.0)
MCHC: 34.2 g/dL (ref 30.0–36.0)
MCV: 98.1 fL (ref 78.0–100.0)
Monocytes Absolute: 0.3 10*3/uL (ref 0.1–1.0)
Monocytes Relative: 5.2 % (ref 3.0–12.0)
Neutro Abs: 2.8 10*3/uL (ref 1.4–7.7)
Neutrophils Relative %: 47.7 % (ref 43.0–77.0)
Platelets: 237 10*3/uL (ref 150.0–400.0)
RBC: 3.84 M/uL — ABNORMAL LOW (ref 3.87–5.11)
RDW: 13.4 % (ref 11.5–14.6)
WBC: 5.9 10*3/uL (ref 4.5–10.5)

## 2008-07-04 ENCOUNTER — Encounter: Payer: Self-pay | Admitting: Internal Medicine

## 2008-07-09 ENCOUNTER — Ambulatory Visit: Payer: Self-pay | Admitting: Internal Medicine

## 2008-07-09 LAB — CONVERTED CEMR LAB
OCCULT 1: NEGATIVE
OCCULT 2: NEGATIVE
OCCULT 3: NEGATIVE

## 2008-08-01 ENCOUNTER — Ambulatory Visit: Payer: Self-pay | Admitting: Gastroenterology

## 2008-08-01 DIAGNOSIS — K219 Gastro-esophageal reflux disease without esophagitis: Secondary | ICD-10-CM | POA: Insufficient documentation

## 2008-08-01 DIAGNOSIS — R1310 Dysphagia, unspecified: Secondary | ICD-10-CM | POA: Insufficient documentation

## 2008-08-08 ENCOUNTER — Ambulatory Visit: Payer: Self-pay | Admitting: Gastroenterology

## 2008-09-13 ENCOUNTER — Ambulatory Visit: Payer: Self-pay | Admitting: Gastroenterology

## 2008-10-23 ENCOUNTER — Ambulatory Visit: Payer: Self-pay | Admitting: Internal Medicine

## 2008-10-25 LAB — CBC WITH DIFFERENTIAL/PLATELET
BASO%: 0.6 % (ref 0.0–2.0)
Basophils Absolute: 0 10*3/uL (ref 0.0–0.1)
EOS%: 4.7 % (ref 0.0–7.0)
Eosinophils Absolute: 0.3 10*3/uL (ref 0.0–0.5)
HCT: 36.7 % (ref 34.8–46.6)
HGB: 12.5 g/dL (ref 11.6–15.9)
LYMPH%: 46.6 % (ref 14.0–49.7)
MCH: 33.5 pg (ref 25.1–34.0)
MCHC: 34 g/dL (ref 31.5–36.0)
MCV: 98.4 fL (ref 79.5–101.0)
MONO#: 0.3 10*3/uL (ref 0.1–0.9)
MONO%: 5.3 % (ref 0.0–14.0)
NEUT#: 2.4 10*3/uL (ref 1.5–6.5)
NEUT%: 42.8 % (ref 38.4–76.8)
Platelets: 210 10*3/uL (ref 145–400)
RBC: 3.73 10*6/uL (ref 3.70–5.45)
RDW: 14.2 % (ref 11.2–14.5)
WBC: 5.7 10*3/uL (ref 3.9–10.3)
lymph#: 2.6 10*3/uL (ref 0.9–3.3)

## 2008-10-28 LAB — IGG, IGA, IGM
IgA: 53 mg/dL — ABNORMAL LOW (ref 68–378)
IgG (Immunoglobin G), Serum: 1810 mg/dL — ABNORMAL HIGH (ref 694–1618)
IgM, Serum: 51 mg/dL — ABNORMAL LOW (ref 60–263)

## 2008-10-28 LAB — COMPREHENSIVE METABOLIC PANEL
ALT: 10 U/L (ref 0–35)
AST: 17 U/L (ref 0–37)
Albumin: 3.8 g/dL (ref 3.5–5.2)
Alkaline Phosphatase: 67 U/L (ref 39–117)
BUN: 8 mg/dL (ref 6–23)
CO2: 25 mEq/L (ref 19–32)
Calcium: 8.9 mg/dL (ref 8.4–10.5)
Chloride: 107 mEq/L (ref 96–112)
Creatinine, Ser: 0.74 mg/dL (ref 0.40–1.20)
Glucose, Bld: 94 mg/dL (ref 70–99)
Potassium: 4.4 mEq/L (ref 3.5–5.3)
Sodium: 141 mEq/L (ref 135–145)
Total Bilirubin: 0.5 mg/dL (ref 0.3–1.2)
Total Protein: 6.9 g/dL (ref 6.0–8.3)

## 2008-10-28 LAB — LACTATE DEHYDROGENASE: LDH: 130 U/L (ref 94–250)

## 2008-10-28 LAB — KAPPA/LAMBDA LIGHT CHAINS
Kappa free light chain: 2.54 mg/dL — ABNORMAL HIGH (ref 0.33–1.94)
Kappa:Lambda Ratio: 4.98 — ABNORMAL HIGH (ref 0.26–1.65)
Lambda Free Lght Chn: 0.51 mg/dL — ABNORMAL LOW (ref 0.57–2.63)

## 2008-10-28 LAB — BETA 2 MICROGLOBULIN, SERUM: Beta-2 Microglobulin: 1.36 mg/L (ref 1.01–1.73)

## 2008-10-30 ENCOUNTER — Encounter: Payer: Self-pay | Admitting: Internal Medicine

## 2009-02-21 ENCOUNTER — Ambulatory Visit: Payer: Self-pay | Admitting: Internal Medicine

## 2009-04-25 ENCOUNTER — Ambulatory Visit: Payer: Self-pay | Admitting: Internal Medicine

## 2009-04-25 LAB — CBC WITH DIFFERENTIAL/PLATELET
BASO%: 0.6 % (ref 0.0–2.0)
Basophils Absolute: 0 10*3/uL (ref 0.0–0.1)
EOS%: 4.6 % (ref 0.0–7.0)
Eosinophils Absolute: 0.3 10*3/uL (ref 0.0–0.5)
HCT: 36.6 % (ref 34.8–46.6)
HGB: 12.5 g/dL (ref 11.6–15.9)
LYMPH%: 31.2 % (ref 14.0–49.7)
MCH: 33.9 pg (ref 25.1–34.0)
MCHC: 34 g/dL (ref 31.5–36.0)
MCV: 99.7 fL (ref 79.5–101.0)
MONO#: 0.4 10*3/uL (ref 0.1–0.9)
MONO%: 5.8 % (ref 0.0–14.0)
NEUT#: 4.2 10*3/uL (ref 1.5–6.5)
NEUT%: 57.8 % (ref 38.4–76.8)
Platelets: 248 10*3/uL (ref 145–400)
RBC: 3.68 10*6/uL — ABNORMAL LOW (ref 3.70–5.45)
RDW: 14.6 % — ABNORMAL HIGH (ref 11.2–14.5)
WBC: 7.2 10*3/uL (ref 3.9–10.3)
lymph#: 2.2 10*3/uL (ref 0.9–3.3)

## 2009-04-28 LAB — BETA 2 MICROGLOBULIN, SERUM: Beta-2 Microglobulin: 1.29 mg/L (ref 1.01–1.73)

## 2009-04-28 LAB — COMPREHENSIVE METABOLIC PANEL
ALT: 27 U/L (ref 0–35)
AST: 41 U/L — ABNORMAL HIGH (ref 0–37)
Albumin: 3.9 g/dL (ref 3.5–5.2)
Alkaline Phosphatase: 65 U/L (ref 39–117)
BUN: 5 mg/dL — ABNORMAL LOW (ref 6–23)
CO2: 22 mEq/L (ref 19–32)
Calcium: 9.3 mg/dL (ref 8.4–10.5)
Chloride: 106 mEq/L (ref 96–112)
Creatinine, Ser: 0.69 mg/dL (ref 0.40–1.20)
Glucose, Bld: 98 mg/dL (ref 70–99)
Potassium: 4.1 mEq/L (ref 3.5–5.3)
Sodium: 140 mEq/L (ref 135–145)
Total Bilirubin: 0.4 mg/dL (ref 0.3–1.2)
Total Protein: 7.1 g/dL (ref 6.0–8.3)

## 2009-04-28 LAB — IGG, IGA, IGM
IgA: 44 mg/dL — ABNORMAL LOW (ref 68–378)
IgG (Immunoglobin G), Serum: 1600 mg/dL (ref 694–1618)
IgM, Serum: 48 mg/dL — ABNORMAL LOW (ref 60–263)

## 2009-04-28 LAB — KAPPA/LAMBDA LIGHT CHAINS
Kappa free light chain: 3.22 mg/dL — ABNORMAL HIGH (ref 0.33–1.94)
Kappa:Lambda Ratio: 2.95 — ABNORMAL HIGH (ref 0.26–1.65)
Lambda Free Lght Chn: 1.09 mg/dL (ref 0.57–2.63)

## 2009-04-28 LAB — LACTATE DEHYDROGENASE: LDH: 152 U/L (ref 94–250)

## 2009-04-30 ENCOUNTER — Encounter: Payer: Self-pay | Admitting: Internal Medicine

## 2009-09-08 ENCOUNTER — Ambulatory Visit: Payer: Self-pay | Admitting: Internal Medicine

## 2009-09-08 DIAGNOSIS — R0609 Other forms of dyspnea: Secondary | ICD-10-CM | POA: Insufficient documentation

## 2009-09-08 DIAGNOSIS — R0989 Other specified symptoms and signs involving the circulatory and respiratory systems: Secondary | ICD-10-CM

## 2009-09-10 LAB — CONVERTED CEMR LAB
Basophils Absolute: 0 10*3/uL (ref 0.0–0.1)
Basophils Relative: 0.8 % (ref 0.0–3.0)
Eosinophils Absolute: 0.1 10*3/uL (ref 0.0–0.7)
Eosinophils Relative: 3.2 % (ref 0.0–5.0)
HCT: 37.2 % (ref 36.0–46.0)
Hemoglobin: 12.4 g/dL (ref 12.0–15.0)
Lymphocytes Relative: 55.3 % — ABNORMAL HIGH (ref 12.0–46.0)
Lymphs Abs: 2.4 10*3/uL (ref 0.7–4.0)
MCHC: 33.5 g/dL (ref 30.0–36.0)
MCV: 100 fL (ref 78.0–100.0)
Monocytes Absolute: 0.2 10*3/uL (ref 0.1–1.0)
Monocytes Relative: 4.4 % (ref 3.0–12.0)
Neutro Abs: 1.6 10*3/uL (ref 1.4–7.7)
Neutrophils Relative %: 36.3 % — ABNORMAL LOW (ref 43.0–77.0)
Platelets: 239 10*3/uL (ref 150.0–400.0)
RBC: 3.72 M/uL — ABNORMAL LOW (ref 3.87–5.11)
RDW: 14.6 % (ref 11.5–14.6)
WBC: 4.4 10*3/uL — ABNORMAL LOW (ref 4.5–10.5)

## 2009-09-18 ENCOUNTER — Encounter (INDEPENDENT_AMBULATORY_CARE_PROVIDER_SITE_OTHER): Payer: Self-pay | Admitting: *Deleted

## 2009-09-18 ENCOUNTER — Ambulatory Visit (HOSPITAL_COMMUNITY): Admission: RE | Admit: 2009-09-18 | Discharge: 2009-09-18 | Payer: Self-pay | Admitting: Internal Medicine

## 2009-10-01 ENCOUNTER — Encounter (INDEPENDENT_AMBULATORY_CARE_PROVIDER_SITE_OTHER): Payer: Self-pay | Admitting: *Deleted

## 2009-10-01 ENCOUNTER — Telehealth: Payer: Self-pay | Admitting: Gastroenterology

## 2009-10-02 ENCOUNTER — Ambulatory Visit: Payer: Self-pay | Admitting: Gastroenterology

## 2009-10-07 ENCOUNTER — Ambulatory Visit: Payer: Self-pay | Admitting: Gastroenterology

## 2009-10-15 ENCOUNTER — Telehealth (INDEPENDENT_AMBULATORY_CARE_PROVIDER_SITE_OTHER): Payer: Self-pay | Admitting: *Deleted

## 2009-10-22 ENCOUNTER — Ambulatory Visit: Payer: Self-pay | Admitting: Internal Medicine

## 2009-10-22 ENCOUNTER — Ambulatory Visit: Payer: Self-pay | Admitting: Gastroenterology

## 2009-10-22 LAB — CBC WITH DIFFERENTIAL/PLATELET
BASO%: 0.7 % (ref 0.0–2.0)
Basophils Absolute: 0 10*3/uL (ref 0.0–0.1)
EOS%: 3.8 % (ref 0.0–7.0)
Eosinophils Absolute: 0.2 10*3/uL (ref 0.0–0.5)
HCT: 36.4 % (ref 34.8–46.6)
HGB: 12.5 g/dL (ref 11.6–15.9)
LYMPH%: 40.2 % (ref 14.0–49.7)
MCH: 33.7 pg (ref 25.1–34.0)
MCHC: 34.3 g/dL (ref 31.5–36.0)
MCV: 98.3 fL (ref 79.5–101.0)
MONO#: 0.3 10*3/uL (ref 0.1–0.9)
MONO%: 5.9 % (ref 0.0–14.0)
NEUT#: 2.8 10*3/uL (ref 1.5–6.5)
NEUT%: 49.4 % (ref 38.4–76.8)
Platelets: 261 10*3/uL (ref 145–400)
RBC: 3.7 10*6/uL (ref 3.70–5.45)
RDW: 13.7 % (ref 11.2–14.5)
WBC: 5.7 10*3/uL (ref 3.9–10.3)
lymph#: 2.3 10*3/uL (ref 0.9–3.3)

## 2009-10-23 LAB — COMPREHENSIVE METABOLIC PANEL
ALT: 13 U/L (ref 0–35)
AST: 24 U/L (ref 0–37)
Albumin: 3.8 g/dL (ref 3.5–5.2)
Alkaline Phosphatase: 58 U/L (ref 39–117)
BUN: 7 mg/dL (ref 6–23)
CO2: 27 mEq/L (ref 19–32)
Calcium: 9.1 mg/dL (ref 8.4–10.5)
Chloride: 103 mEq/L (ref 96–112)
Creatinine, Ser: 0.84 mg/dL (ref 0.40–1.20)
Glucose, Bld: 89 mg/dL (ref 70–99)
Potassium: 4.5 mEq/L (ref 3.5–5.3)
Sodium: 139 mEq/L (ref 135–145)
Total Bilirubin: 0.4 mg/dL (ref 0.3–1.2)
Total Protein: 6.8 g/dL (ref 6.0–8.3)

## 2009-10-23 LAB — BETA 2 MICROGLOBULIN, SERUM: Beta-2 Microglobulin: 1.53 mg/L (ref 1.01–1.73)

## 2009-10-23 LAB — LACTATE DEHYDROGENASE: LDH: 124 U/L (ref 94–250)

## 2009-10-23 LAB — KAPPA/LAMBDA LIGHT CHAINS
Kappa free light chain: 3.25 mg/dL — ABNORMAL HIGH (ref 0.33–1.94)
Kappa:Lambda Ratio: 4.17 — ABNORMAL HIGH (ref 0.26–1.65)
Lambda Free Lght Chn: 0.78 mg/dL (ref 0.57–2.63)

## 2009-10-23 LAB — IGG, IGA, IGM
IgA: 44 mg/dL — ABNORMAL LOW (ref 68–378)
IgG (Immunoglobin G), Serum: 1520 mg/dL (ref 694–1618)
IgM, Serum: 45 mg/dL — ABNORMAL LOW (ref 60–263)

## 2009-10-24 ENCOUNTER — Telehealth: Payer: Self-pay | Admitting: Gastroenterology

## 2009-10-29 ENCOUNTER — Encounter: Payer: Self-pay | Admitting: Internal Medicine

## 2009-11-03 ENCOUNTER — Ambulatory Visit: Payer: Self-pay | Admitting: Gastroenterology

## 2010-01-14 ENCOUNTER — Encounter: Payer: Self-pay | Admitting: Internal Medicine

## 2010-01-28 ENCOUNTER — Encounter: Payer: Self-pay | Admitting: Internal Medicine

## 2010-02-09 ENCOUNTER — Encounter: Payer: Self-pay | Admitting: Internal Medicine

## 2010-02-11 ENCOUNTER — Other Ambulatory Visit: Payer: Self-pay

## 2010-02-11 ENCOUNTER — Encounter: Payer: Self-pay | Admitting: Internal Medicine

## 2010-02-19 ENCOUNTER — Encounter: Payer: Self-pay | Admitting: Internal Medicine

## 2010-02-20 ENCOUNTER — Encounter
Admission: RE | Admit: 2010-02-20 | Discharge: 2010-02-20 | Payer: Self-pay | Source: Home / Self Care | Attending: Radiology | Admitting: Radiology

## 2010-02-24 ENCOUNTER — Other Ambulatory Visit (HOSPITAL_COMMUNITY): Payer: Self-pay | Admitting: *Deleted

## 2010-02-24 ENCOUNTER — Other Ambulatory Visit (HOSPITAL_COMMUNITY): Payer: Self-pay | Admitting: General Surgery

## 2010-02-24 DIAGNOSIS — C50919 Malignant neoplasm of unspecified site of unspecified female breast: Secondary | ICD-10-CM

## 2010-02-24 NOTE — Letter (Signed)
Summary: [facility] Instructions  [facility]  [address] [place], [place] [address]   Phone: [phone]  Fax: [phone]       [name]    05-07-1940    MRN: [id number]        Procedure Day /Date:MONDAY 11/03/2009     Arrival Time:2:30PM     Procedure Time:3:30PM     Location of Procedure:                    X  [facility] (4th Floor)                       PREPARATION FOR COLONOSCOPY WITH MOVIPREP   Starting 5 days prior to your procedure 10/29/2009 do not eat nuts, seeds, popcorn, corn, beans, peas,  salads, or any raw vegetables.  Do not take any fiber supplements (e.g. Metamucil, Citrucel, and Benefiber).  THE DAY BEFORE YOUR PROCEDURE         DATE: 11/02/2009  DAY: SUNDAY  1.  Drink clear liquids the entire day-NO SOLID FOOD  2.  Do not drink anything colored red or purple.  Avoid juices with pulp.  No orange juice.  3.  Drink at least 64 oz. (8 glasses) of fluid/clear liquids during the day to prevent dehydration and help the prep work efficiently.  CLEAR LIQUIDS INCLUDE: Water Jello Ice Popsicles Tea (sugar ok, no milk/cream) Powdered fruit flavored drinks Coffee (sugar ok, no milk/cream) Gatorade Juice: apple, white grape, white cranberry  Lemonade Clear bullion, consomm, broth Carbonated beverages (any kind) Strained chicken noodle soup Hard Candy                             4.  In the morning, mix first dose of MoviPrep solution:    Empty 1 Pouch A and 1 Pouch B into the disposable container    Add lukewarm drinking water to the top line of the container. Mix to dissolve    Refrigerate (mixed solution should be used within 24 hrs)  5.  Begin drinking the prep at 5:00 p.m. The MoviPrep container is divided by 4 marks.   Every 15 minutes drink the solution down to the next mark (approximately 8 oz) until the full liter is complete.   6.  Follow completed prep with 16 oz of clear liquid of your choice (Nothing red  or purple).  Continue to drink clear liquids until bedtime.  7.  Before going to bed, mix second dose of MoviPrep solution:    Empty 1 Pouch A and 1 Pouch B into the disposable container    Add lukewarm drinking water to the top line of the container. Mix to dissolve    Refrigerate  THE DAY OF YOUR PROCEDURE      DATE:11/03/2009 DAY: MONDAY  Beginning at10:30AM (5 hours before procedure):         1. Every 15 minutes, drink the solution down to the next mark (approx 8 oz) until the full liter is complete.  2. Follow completed prep with 16 oz. of clear liquid of your choice.    3. You may drink clear liquids until 1:30PM(2 HOURS BEFORE PROCEDURE).   MEDICATION INSTRUCTIONS  Unless otherwise instructed, you should take regular prescription medications with a small sip of water   as early as possible the morning of your procedure.        OTHER INSTRUCTIONS  You will need a responsible adult at least 69 years of age to accompany you and drive you home.   This person must remain in the waiting room during your procedure.  Wear loose fitting clothing that is easily removed.  Leave jewelry and other valuables at home.  However, you may wish to bring a book to read or  an iPod/MP3 player to listen to music as you wait for your procedure to start.  Remove all body piercing jewelry and leave at home.  Total time from sign-in until discharge is approximately 2-3 hours.  You should go home directly after your procedure and rest.  You can resume normal activities the  day after your procedure.  The day of your procedure you should not:   Drive   Make legal decisions   Operate machinery   Drink alcohol   Return to work  You will receive specific instructions about eating, activities and medications before you leave.    The above instructions have been reviewed and explained to me by   _______________________    I fully understand and can verbalize these  instructions _____________________________ Date _________

## 2010-02-24 NOTE — Letter (Signed)
[facility]   Imported By: Lanelle Bal 05/23/2009 11:44:11  _____________________________________________________________________  External Attachment:    Type:   Image     Comment:   External Document

## 2010-02-24 NOTE — Letter (Signed)
Summary: [facility]  [facility]   Imported By: Lanelle Bal 11/15/2008 10:57:04  _____________________________________________________________________  External Attachment:    Type:   Image     Comment:   External Document

## 2010-02-24 NOTE — Progress Notes (Signed)
Summary: [facility]  Phone Note Outgoing Call   Call placed by: Doristine Devoid,  January 17, 2007 2:47 PM Call placed to: Patient Details for Reason: DISCUSS URINE CX Summary of Call: INFORMED PT URINE CULTURE NEG. BUT IF HAVING SX MED CAN BE CALLED IN TO PHARMACY  Follow-up for Phone Call        pt uses [facility], [phone] ..................................................................Marland KitchenCharolette Child  January 17, 2007 4:20 PM  Additional Follow-up for Phone Call Additional follow up Details #1::        dr. hopper  scripited nitrofurantoin monohyd macro 100mg   faxed over the rx to the pharmacy ..................................................................Marland KitchenCharolette Child  January 17, 2007 4:22 PM

## 2010-02-24 NOTE — Progress Notes (Signed)
Summary: congested,unable to urinate-hopper-left msg to call  Phone Note Call from Patient Call back at [phone]   Caller: Patient Summary of Call: patient had a temp last 3 nights 102, 100,101 - congested - also unable to urinate   Initial call taken by: Okey Regal Spring,  January 09, 2007 9:44 AM  Follow-up for Phone Call        left message on machine if unable to urinate at needs to go to urgent care  Follow-up by: Doristine Devoid,  January 09, 2007 10:42 AM  Additional Follow-up for Phone Call Additional follow up Details #1::        SPOKE WITH PATIENT, HIGHLY RECOMMENDED UC, PATIENT SAID SHE WORKS NEAR [address] AND SHE WILL GO TO URGENT CARE TODAY. I DID APOLOGIZE THAT WE HAVE NO AVALIABLE APPOINTMENTS TODAY WITH NONE OF THE DR.'S HERE.  Additional Follow-up by: Shonna Chock,  January 09, 2007 11:30 AM    Additional Follow-up for Phone Call Additional follow up Details #2::    noted; please follow up as she has myeloma Follow-up by: Marga Melnick MD,  January 09, 2007 1:56 PM  Additional Follow-up for Phone Call Additional follow up Details #3:: Details for Additional Follow-up Action Taken: CALLED PATIENT TO MAKE SURE SHE WENT TO URGENT CARE, PATIENT DID NOT GO, SHE DIDNT FEEL LIKE GOING THERE AND WAITING FOREVER. PATIENT WOULD LIKE TO BE SEEN TOMORROW. PLACED PATIENT IN A 4PM SLOT, PATIENT AWARE ANY CHANGE IN SX (SMALL OR BIG) EMERGENCY ROOM. Additional Follow-up by: Shonna Chock,  January 09, 2007 4:54 PM

## 2010-02-24 NOTE — Miscellaneous (Signed)
Summary: [facility] previsit  Clinical Lists Changes 

## 2010-02-24 NOTE — Procedures (Signed)
Summary: Upper Endoscopy  Patient: [name] Note: All result statuses are Final unless otherwise noted.  Tests: (1) Upper Endoscopy (EGD)   EGD Upper Endoscopy       DONE     [facility]     [address]     [place], [place]  [address]           ENDOSCOPY PROCEDURE REPORT           PATIENT:  [name], [name]  MR#:  [id number]     BIRTHDATE:  1940/02/05, 69 yrs. old  GENDER:  female           ENDOSCOPIST:  Barbette Hair. Arlyce Dice, MD     Referred by:           PROCEDURE DATE:  10/07/2009     PROCEDURE:  EGD, diagnostic, Maloney Dilation of Esophagus     ASA CLASS:  Class II     INDICATIONS:  dysphagia           MEDICATIONS:   Fentanyl 25 mcg IV, Versed 4 mg IV, glycopyrrolate     (Robinal) 0.2 mg IV, 0.6cc simethancone 0.6 cc PO     TOPICAL ANESTHETIC:  Exactacain Spray           DESCRIPTION OF PROCEDURE:   After the risks benefits and     alternatives of the procedure were thoroughly explained, informed     consent was obtained.  The LB GIF-H180 [id number] endoscope was     introduced through the mouth and advanced to the third portion of     the duodenum, without limitations.  The instrument was slowly     withdrawn as the mucosa was fully examined.     <<PROCEDUREIMAGES>>           A stricture was found at the gastroesophageal junction (see     image1). Early esophageal stricture Dilation with maloney dilator     18mm Minimal resistance; no heme  Otherwise the examination was     normal.    Retroflexed views revealed no abnormalities.    The     scope was then withdrawn from the patient and the procedure     completed.           COMPLICATIONS:  None           ENDOSCOPIC IMPRESSION:     1) Stricture at the gastroesophageal junction     2) Otherwise normal examination     RECOMMENDATIONS:     1) dilatations PRN     2) Call office next 2-3 days to schedule an office appointment     for 1 month           REPEAT EXAM:  No        ______________________________     Barbette Hair. Arlyce Dice, MD           CC:  Pecola Lawless, MD           n.     Rosalie Doctor:   Barbette Hair. Torrian Canion at 10/07/2009 01:59 PM           Carolan Clines, [id number]  Note: An exclamation mark (!) indicates a result that was not dispersed into the flowsheet. Document Creation Date: 10/07/2009 1:59 PM _______________________________________________________________________  (1) Order result status: Final Collection or observation date-time: 10/07/2009 13:53 Requested date-time:  Receipt date-time:  Reported date-time:  Referring Physician:   Ordering Physician: Melvia Heaps [id number]) Specimen Source:  Source: EndoProS  Filler Order Number: [id number] Lab site:

## 2010-02-24 NOTE — Consult Note (Signed)
Summary: [facility]   Imported By: Lanelle Bal 07/11/2008 15:09:28  _____________________________________________________________________  External Attachment:    Type:   Image     Comment:   External Document

## 2010-02-24 NOTE — Assessment & Plan Note (Signed)
Summary: SINUS PAIN,TEETH HURTS/CDJ   Vital Signs:  Patient Profile:   70 Years Old Female Weight:      125 pounds Temp:     97.6 degrees F oral Pulse rate:   72 / minute Pulse rhythm:   regular BP sitting:   120 / 70  (left arm)  Pt. in pain?   yes    Location:   head    Intensity:   10    Type:       aching  Vitals Entered By: Wendall Stade (September 23, 2006 5:25 PM)                  Chief Complaint:  sinus pain and pressure.  History of Present Illness: head congestion, teeth feel like they hurt, throat better today no fever, chills or sweats, but has sinus pain and pressure for two weeks  Current Allergies (reviewed today): ! PCN Updated/Current Medications (including changes made in today's visit):  TRAMADOL HCL 50 MG  TABS (TRAMADOL HCL) 1-2 q 4-6 hrs prn BACTRIM DS 800-160 MG  TABS (SULFAMETHOXAZOLE-TRIMETHOPRIM) 1 two times a day with 8 0z water      Review of Systems  General      Denies chills, fever, sweats, and weight loss.      always tired & cold  Eyes      Denies discharge, eye irritation, eye pain, red eye, and vision loss-1 eye.  ENT      See HPI      Complains of earache, hoarseness, nasal congestion, and sinus pressure.      Denies difficulty swallowing and ear discharge.      ST better with Chloraseptic  Resp      Complains of cough.      Denies shortness of breath, sputum productive, and wheezing.   Physical Exam  General:     alert, well-developed, and well-nourished.   Eyes:     Asymmetry with EOM(chronic)pupils equal, pupils round, pupils reactive to light, corneas and lenses clear, and no injection.   Ears:     External ear exam shows no significant lesions or deformities.  Otoscopic examination reveals clear canals, tympanic membranes are intact bilaterally without bulging, retraction, inflammation or discharge. Hearing is grossly normal bilaterally. Nose:     Boggy bilat; hyponasal speech Mouth:     Oral mucosa and  oropharynx without lesions or exudates.  Teeth in good repair. Lungs:     Normal respiratory effort, chest expands symmetrically. Lungs are clear to auscultation, no crackles or wheezes. Cervical Nodes:     No lymphadenopathy noted Axillary Nodes:     No palpable lymphadenopathy    Impression & Recommendations:  Problem # 1:  FRONTAL SINUSITIS (ICD-473.1)  Her updated medication list for this problem includes:    Bactrim Ds 800-160 Mg Tabs (Sulfamethoxazole-trimethoprim) .[name]... 1 two times a day with 8 0z water   Complete Medication List: 1)  Tramadol Hcl 50 Mg Tabs (Tramadol hcl) .[name].. 1-2 q 4-6 hrs prn 2)  Bactrim Ds 800-160 Mg Tabs (Sulfamethoxazole-trimethoprim) .[name].. 1 two times a day with 8 0z water   Patient Instructions: 1)  Drink as much fluid as you can tolerate for the next few days. Neti pot once daily as needed  & plain Mucinex.    Prescriptions: BACTRIM DS 800-160 MG  TABS (SULFAMETHOXAZOLE-TRIMETHOPRIM) 1 two times a day with 8 0z water  #20 x 0   Entered and Authorized by:   Marga Melnick MD  Signed by:   Marga Melnick MD on 09/23/2006   Method used:   Print then Give to Patient   RxID:   [id number]

## 2010-02-24 NOTE — Letter (Signed)
Summary: EGD Instructions  [facility]  [address] [place], [place] [address]   Phone: [phone]  Fax: [phone]       [name]    06-29-1940    MRN: [id number]       Procedure Day [name]:  [name]  10/07/09     Arrival Time:  12:30PM     Procedure Time:  1:30PM     Location of Procedure:                    [facility] (4th Floor)    PREPARATION FOR ENDOSCOPY   On 10/07/09 THE DAY OF THE PROCEDURE:  1.   No solid foods, milk or milk products are allowed after midnight the night before your procedure.  2.   Do not drink anything colored red or purple.  Avoid juices with pulp.  No orange juice.  3.  You may drink clear liquids until 11:30AM, which is 2 hours before your procedure.                                                                                                CLEAR LIQUIDS INCLUDE: Water Jello Ice Popsicles Tea (sugar ok, no milk/cream) Powdered fruit flavored drinks Coffee (sugar ok, no milk/cream) Gatorade Juice: apple, white grape, white cranberry  Lemonade Clear bullion, consomm, broth Carbonated beverages (any kind) Strained chicken noodle soup Hard Candy   MEDICATION INSTRUCTIONS  Unless otherwise instructed, you should take regular prescription medications with a small sip of water as early as possible the morning of your procedure.            OTHER INSTRUCTIONS  You will need a responsible adult at least 69 years of age to accompany you and drive you home.   This person must remain in the waiting room during your procedure.  Wear loose fitting clothing that is easily removed.  Leave jewelry and other valuables at home.  However, you may wish to bring a book to read or an iPod/MP3 player to listen to music as you wait for your procedure to start.  Remove all body piercing jewelry and leave at home.  Total time from sign-in until discharge is approximately 2-3 hours.  You should go  home directly after your procedure and rest.  You can resume normal activities the day after your procedure.  The day of your procedure you should not:   Drive   Make legal decisions   Operate machinery   Drink alcohol   Return to work  You will receive specific instructions about eating, activities and medications before you leave.    The above instructions have been reviewed and explained to me by   Karl Bales RN  October 02, 2009 3:33 PM    I fully understand and can verbalize these instructions _____________________________ Date _________

## 2010-02-24 NOTE — Procedures (Signed)
Summary: Colonoscopy  Patient: [name] Note: All result statuses are Final unless otherwise noted.  Tests: (1) Colonoscopy (COL)   COL Colonoscopy           DONE     [facility]     [address]     [place], [place]  [address]           COLONOSCOPY PROCEDURE REPORT           PATIENT:  [name], [name]  MR#:  [id number]     BIRTHDATE:  July 11, 1940, 69 yrs. old  GENDER:  female           ENDOSCOPIST:  Megan Hair. Arlyce Dice, MD     Referred by:           PROCEDURE DATE:  11/03/2009     PROCEDURE:  Diagnostic Colonoscopy     ASA CLASS:  Class II     INDICATIONS:  1) Routine Risk Screening           MEDICATIONS:   Fentanyl 75 mcg IV, Versed 7 mg IV           DESCRIPTION OF PROCEDURE:   After the risks benefits and     alternatives of the procedure were thoroughly explained, informed     consent was obtained.  Digital rectal exam was performed and     revealed no abnormalities.   The LB 180AL [id number] endoscope was     introduced through the anus and advanced to the cecum, which was     identified by the ileocecal valve, without limitations.  The     quality of the prep was adequate, using MiraLax.  The instrument     was then slowly withdrawn as the colon was fully examined.     <<PROCEDUREIMAGES>>           FINDINGS:  A normal appearing cecum, ileocecal valve, and     appendiceal orifice were identified. The ascending, hepatic     flexure, transverse, splenic flexure, descending, sigmoid colon,     and rectum appeared unremarkable (see image2, image3, image5,     image6, image7, image10, image12, image14, image15, and image16).     Retroflexed views in the rectum revealed no abnormalities.    The     time to cecum =  3.25  minutes. The scope was then withdrawn (time     =  7.0  min) from the patient and the procedure completed.           COMPLICATIONS:  None           ENDOSCOPIC IMPRESSION:     1) Normal colon     RECOMMENDATIONS:     1) Continue current  colorectal screening recommendations for     "routine risk" patients with a repeat colonoscopy in 10 years.           REPEAT EXAM:  In 10 year(s) for Colonoscopy.           ______________________________     Megan Hair. Arlyce Dice, MD           CC: Megan Lawless, MD           n.     [name]:   [name] at 11/03/2009 04:33 PM           [name], [id number]  Note: An exclamation mark (!) indicates a result that was not dispersed into the flowsheet. Document Creation Date: 11/03/2009 4:33 PM  _______________________________________________________________________  (1) Order result status: Final Collection or observation date-time: 11/03/2009 16:28 Requested date-time:  Receipt date-time:  Reported date-time:  Referring Physician:   Ordering Physician: [name] [phone]) Specimen Source:  Source: Launa Grill Order Number: [id number] Lab site:   Appended Document: Colonoscopy    Clinical Lists Changes  Observations: Added new observation of COLONNXTDUE: 10/2019 (11/03/2009 16:45)

## 2010-02-24 NOTE — Progress Notes (Signed)
Summary: result of lab & [place]- dr hopper  Phone Note Call from Patient Call back at Home Phone [phone] Call back at [phone] cell   Caller: Patient Summary of Call: Patient had labs & [place] would like result Initial call taken by: Okey Regal Spring,  July 25, 2007 9:17 AM  Follow-up for Phone Call        spoke with patient aware of labs and per hop at this time no prescription will be sent of tsh is not resolved in 3 months........Marland KitchenDoristine Devoid  July 25, 2007 12:01 PM

## 2010-02-24 NOTE — Procedures (Signed)
Summary: [facility]   Imported By: Sherian Rein 08/09/2008 07:10:22  _____________________________________________________________________  External Attachment:    Type:   Image     Comment:   External Document

## 2010-02-24 NOTE — Procedures (Signed)
Summary: WITH DILATION/RS DX DYSPHAGIA-rs--NO PCR   EGD  Procedure date:  08/08/2008  Findings:      Location: [facility]   ENDOSCOPY PROCEDURE REPORT  PATIENT:  [name], [name]  MR#:  [id number] BIRTHDATE:   1941/01/24, 67 yrs. old   GENDER:   female  ENDOSCOPIST:   Barbette Hair. Arlyce Dice, MD Referred by: Marga Melnick, M.D.  PROCEDURE DATE:  08/08/2008 PROCEDURE:  EGD, diagnostic, Maloney Dilation of Esophagus ASA CLASS:   Class II INDICATIONS: dysphagia   MEDICATIONS:    Fentanyl 50 mcg IV, Versed 6 mg IV, glycopyrrolate (Robinal) 0.2 mg IV, 0.6cc simethancone 0.6 cc PO TOPICAL ANESTHETIC:   Exactacain Spray  DESCRIPTION OF PROCEDURE:   After the risks benefits and alternatives of the procedure were thoroughly explained, informed consent was obtained.  The LB GIF-H180 [id number] endoscope was introduced through the mouth and advanced to the third portion of the duodenum, without limitations.  The instrument was slowly withdrawn as the mucosa was fully examined. <<PROCEDUREIMAGES>>  <<OLD IMAGES>>  A stricture was found at the gastroesophageal junction (see image3 and image1). Dilation with maloney dilator 18mm Moderate resistance; no heme  Otherwise the examination was normal.    Retroflexed views revealed no abnormalities.    The scope was then withdrawn from the patient and the procedure completed.  COMPLICATIONS:   None  ENDOSCOPIC IMPRESSION:  1) Stricture at the gastroesophageal junction - s/p dilitation  2) Otherwise normal examination RECOMMENDATIONS:  1) Call office next 2-3 days to schedule an office appointment for 1 month  REPEAT EXAM:   No   _______________________________ Barbette Hair. Arlyce Dice, MD    CC:

## 2010-02-24 NOTE — Progress Notes (Signed)
Summary: REV Scheduled  ---- Converted from flag ---- ---- 09/29/2009 4:12 PM, Louis Meckel MD wrote:  i will get her scheduled for an OV.  I don't see the report. Would you kindly sendnit to me? ---- 09/19/2009 4:06 PM, Marga Melnick MD wrote: Please review OV & swallowing study. "Esophageal w/u " recommended by Speech Therapy. Can you get her scheduled to see you after you return 09/30/2009? Thanks, Hopp ------------------------------  Phone Note Outgoing Call   Call placed by: Laureen Ochs LPN,  October 01, 2009 10:15 AM Call placed to: Patient Summary of Call: Pt. will  see Dr.Kasra Melvin on 10-06-09 at 9:30am. Pt. instructed to call back as needed.  Initial call taken by: Laureen Ochs LPN,  October 01, 2009 10:19 AM     Appended Document: REV Scheduled Update from Dr.Averill Pons-Schedule for an Endo/Dil. then an REV.  Message left for patient to callback.  Appended Document: REV Scheduled Pt. is scheduled for a pre-visit on 10-02-09 at 4pm, Endo/Dil. in [facility] on 10-07-09 at 1:30pm and an REV with Dr.Amberleigh Gerken on 10-22-09 at 2:30pm. Her husband is in the hospital, so pt. may callback to reschedule these appointments. Pt. instructed to call back as needed.

## 2010-02-24 NOTE — Progress Notes (Signed)
Summary: PT IS HAVING SINUS  Phone Note Call from Patient Call back at Work Phone [phone] Call back at [phone]   Caller: Patient Reason for Call: Acute Illness, Talk to Nurse Summary of Call: DR HOPPER.Marland KitchenPT IS HAVING ALOT OF SINUS,RUNNY NOSE,PRESSURE IN HEAD AND HER CHEEKS AND TEETH ARE HURTING. WHAT CAN PT TAKE OVER THE COUNTER Initial call taken by: Job Founds,  September 23, 2006 11:11 AM  Follow-up for Phone Call        CALL PT MADE RECOMMENDATIONS SOME SHE HAD ALREADY TRIED AS WELL AS THE NETTI POT AND NOTHING IS HELPING HAS BEEN DEALING WITH SINCE LAST WEEK Follow-up by: Doristine Devoid,  September 23, 2006 11:44 AM

## 2010-02-24 NOTE — Letter (Signed)
Summary: Results Follow up Letter  [facility] [place]  [address] [place], [place] [address]   Phone: [phone]  Fax: [phone]    09/08/2006 MRN: [id number]  ALLICE GARRO [address] [place], [place]  [address]  Dear [name],  The following are the results of your recent test(s):  Test         Result    Pap Smear:        Normal _____  Not Normal _____ Comments: ______________________________________________________ Cholesterol: LDL(Bad cholesterol):         Your goal is less than:         HDL (Good cholesterol):       Your goal is more than: Comments:  ______________________________________________________ Mammogram:        Normal _____  Not Normal _____ Comments:  ___________________________________________________________________ Hemoccult:        Normal _____  Not normal _______ Comments:    _____________________________________________________________________ Other Tests:  Please see attached results and comments   We routinely do not discuss normal results over the telephone.  If you desire a copy of the results, or you have any questions about this information we can discuss them at your next office visit.   Sincerely,

## 2010-02-24 NOTE — Letter (Signed)
Summary: [facility]  [facility]   Imported By: Lanelle Bal 11/27/2009 12:45:46  _____________________________________________________________________  External Attachment:    Type:   Image     Comment:   External Document

## 2010-02-24 NOTE — Letter (Signed)
Summary: [facility]  [facility]   Imported By: Lanelle Bal 05/29/2008 11:02:57  _____________________________________________________________________  External Attachment:    Type:   Image     Comment:   External Document
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
## 2010-02-24 NOTE — Assessment & Plan Note (Signed)
Summary: throat issue/kn   Vital Signs:  Patient profile:   70 year old female Weight:      131 pounds BMI:     21.88 O2 Sat:      99 % on Room air Temp:     98.6 degrees F oral Pulse rate:   84 / minute Resp:     16 per minute BP sitting:   122 / 80  (left arm) Cuff size:   large  Vitals Entered By: Shonna Chock CMA (September 08, 2009 2:51 PM)  O2 Flow:  Room air CC: Breathing issues x 3 weeks, shortness of breath   Primary Care Provider:  Marga Melnick, MD  CC:  Breathing issues x 3 weeks and shortness of breath.  History of Present Illness:       This is a 70 year old female who presents with "I can't breathe" manifested by fullness in her throat.  There is no history of COPD, asthma, ASCVD, CHF, valvular heart disease, arrhythmias, renal failure, and thyroid disease.  She has PMH of dysphagia;S/P esophagus dilation( record 07/2008 , Dr Arlyce Dice reviewed) .Now  " some foods like  apples & bread  & all  drinks except water cause (sensation) a balloon blowing up". When she lies down " a door (in my throat) closes off". The patient notes no  other precipitating events or environmental factors. Specially exercise will not cause these symptoms.  Patient notes dyspnea with  thses  throat sensations. Cough is  minimal  & described as non-productive .   Current Medications (verified): 1)  Tramadol Hcl 50 Mg  Tabs (Tramadol Hcl) .[name].. 1-2 Q 4-6 Hrs Prn 2)  Hyomax-Sl 0.125 Mg Subl (Hyoscyamine Sulfate) .... Take 2 Tablets Sublingual Every 4 Hours As Needed  Allergies: 1)  ! Pcn  Past History:  Past Surgical History: Hysterectomy & BSO for pain,prolapse ; G3 P2 BSO for benign growths Hemorrhoidectomy Appendectomy Cath 2005:neg Colonoscopy negative   Review of Systems ENT:  Complains of hoarseness. CV:  Denies difficulty breathing at night, difficulty breathing while lying down, and swelling of hands; Occasional pedal edema. GI:  Denies bloody stools, dark tarry stools, and  indigestion. Allergy:  Denies itching eyes and sneezing.  Physical Exam  General:  in no acute distress; alert,appropriate and cooperative throughout examination Eyes:  No corneal or conjunctival inflammation notedNo icterus Mouth:  Oral mucosa and oropharynx without lesions or exudates.  Teeth in good repair. No pharyngeal erythema.   Neck:  No deformities, masses, or tenderness noted.No UAO with hyperventilation Lungs:  Normal respiratory effort, chest expands symmetrically. Lungs are clear to auscultation, no crackles or wheezes. Heart:  normal rate, regular rhythm, no gallop, no rub, no JVD, no HJR, and grade 1 /6 systolic murmur.   Abdomen:  Bowel sounds positive,abdomen soft and non-tender without masses, organomegaly or hernias noted. Pulses:  R and L carotid pulses are full and equal bilaterally Extremities:  No clubbing, cyanosis, edema. Neurologic:  alert & oriented X3.   Skin:  Intact without suspicious lesions or rashes. No jaundice Cervical Nodes:  No lymphadenopathy noted Axillary Nodes:  No palpable lymphadenopathy Psych:  memory intact for recent and remote, normally interactive, and good eye contact.     Impression & Recommendations:  Problem # 1:  DYSPHAGIA UNSPECIFIED (ICD-787.20)  Orders: Venipuncture (81191) TLB-CBC Platelet - w/Differential (85025-CBCD)  Problem # 2:  DYSPNEA/SHORTNESS OF BREATH (ICD-786.09)  Pseudo dyspnea due to Globus sensation  Orders: Venipuncture (47829) TLB-CBC Platelet - w/Differential (  85025-CBCD) Radiology Referral (Radiology)  Complete Medication List: 1)  Tramadol Hcl 50 Mg Tabs (Tramadol hcl) .[name].. 1-2 q 4-6 hrs prn 2)  Hyomax-sl 0.125 Mg Subl (Hyoscyamine sulfate) .... Take 2 tablets sublingual every 4 hours as needed 3)  Omeprazole 20 Mg Cpdr (Omeprazole) .[name].. 1 two times a day pre meals  Patient Instructions: 1)  Avoid foods high in acid (tomatoes, citrus juices, spicy foods). Avoid eating within two hours of lying down  or before exercising. Do not over eat; try smaller more frequent meals. Elevate head of bed twelve inches when sleeping.  Complete stool cards Prescriptions: OMEPRAZOLE 20 MG CPDR (OMEPRAZOLE) 1 two times a day pre meals  #60 x 1   Entered and Authorized by:   Marga Melnick MD   Signed by:   Marga Melnick MD on 09/08/2009   Method used:   Print then Give to Patient   RxID:   [id number]   Appended Document: throat issue/kn

## 2010-02-24 NOTE — Letter (Signed)
Summary: [facility]  [facility]   Imported By: Lanelle Bal 02/19/2008 10:23:03  _____________________________________________________________________  External Attachment:    Type:   Image     Comment:   External Document

## 2010-02-24 NOTE — Consult Note (Signed)
Summary: [facility]CER CENTER/PROGRESS NOTE  CANCER CENTER/PROGRESS NOTE   Imported By: Freddy Jaksch 12/08/2006 14:35:15  _____________________________________________________________________  External Attachment:    Type:   Image     Comment:   INTER

## 2010-02-24 NOTE — Progress Notes (Signed)
Summary: **Lab Results**  Phone Note Outgoing Call Call back at [facility] Phone [phone]   Call placed by: Shonna Chock,  July 02, 2008 10:25 AM Summary of Call: Left message on machine for patient to return call when avaliable, Reason for call:  Blood counts are normal ;no anemia present. Please see Dr Ambrose Mantle because of the abdominal pain , bloating & frequent urination. If all ovarian tissue has been removed; a  Gastroenterolgy evaluation of the abdominal pain will be needed. Don't forget to do the calf exercises before getting out of bed as your BP drops when you stand up.   PATIENT CALLED BACK, PATIENT OK'D ALL INFORMATION. PATIENT WILL SEE DR.HENLEY ON THURSDAY AND THEN F/U WITH [place] TO DECIED ON GI REFERRAL. PATIENT SAID SHE IS ABOUT 99% SURE ALL OVARIAN TISSUE REMOVED BUT WILL F/U AFTER SEEING DR.HENLEY./Chrae Malloy  July 02, 2008 11:17 AM

## 2010-02-24 NOTE — Assessment & Plan Note (Signed)
Summary: rlq pain, dysphagia.[name]   History of Present Illness Visit Type: consult  Primary GI MD: Melvia Heaps MD [facility] Primary Provider: Marga Melnick, MD Requesting Provider: Marga Melnick, MD Chief Complaint: Pt states that she was having RLQ pain but its better. Pt states that she is just having dysphagia with both solids and liquids with nausea [name] is a pleasant 70 year old white female referred the request of Dr. Alwyn Ren for evaluation of dysphagia.  She was complaining of intermittent sharp right lower quadrant pain at the time of this visit.  Painhas subsided.  She occasionally gets similar pain in her upper abdomen and on the left side.  They last up to 10 minutes.  She suffers from chronic constipation and requires a laxative to have a bowel movement.  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  She denies melena or hematochezia.  She does complain of dysphagia to both solids and liquids though she denies pyrosis.  Weight has been stable.  She is also complaining of postprandial bloating and frequent nausea.   GI Review of Systems    Reports bloating, chest pain, dysphagia with liquids, dysphagia with solids, and  nausea.      Denies abdominal pain, acid reflux, belching, heartburn, loss of appetite, vomiting, vomiting blood, weight loss, and  weight gain.      Reports constipation and  irritable bowel syndrome.     Denies anal fissure, black tarry stools, change in bowel habit, diarrhea, diverticulosis, fecal incontinence, heme positive stool, hemorrhoids, jaundice, light color stool, liver problems, rectal bleeding, and  rectal pain.    Current Medications (verified): 1)  Tramadol Hcl 50 Mg  Tabs (Tramadol Hcl) .Megan Kitchen.. 1-2 Q 4-6 Hrs Prn  Allergies (verified): 1)  ! Pcn  Past History:  Past Medical History: Reviewed history from 07/10/2007 and no changes required. Hyperlipidemia ; Vertical diplopia ; MVP Anemia-NOS, PMH,  Dr Arbutus Ped Monoclonal gammopathy;Dr "  Past Surgical History: Reviewed history from 07/10/2007 and no changes required. Hysterectomy & BSO for pain,prolapse ; G3 P2 BSO for benign growths Hemorrhoidectomy Appendectomy Cath 2005:neg Colonoscopy neg  Family History: Family History of Heart Disease: Mother and brother Family History of Diabetes: Mother and brother  No FH of Colon Cancer:  Social History: Occupation: [employer] Married Patient has never smoked.  Alcohol Use - no Illicit Drug Use - no Patient does not get regular exercise.  Daily Caffeine Use: 3 cups of coffee daily  Smoking Status:  never Drug Use:  no Does Patient Exercise:  no  Review of Systems       The patient complains of allergy/sinus, arthritis/joint pain, back pain, cough, muscle pains/cramps, shortness of breath, sleeping problems, and sore throat.  The patient denies anemia, anxiety-new, blood in urine, breast changes/lumps, change in vision, confusion, coughing up blood, depression-new, fainting, fatigue, fever, headaches-new, hearing problems, heart murmur, heart rhythm changes, itching, menstrual pain, night sweats, nosebleeds, pregnancy symptoms, skin rash, swelling of feet/legs, thirst - excessive , urination - excessive , urination changes/pain, urine leakage, vision changes, and voice change.         All other systems were reviewed and were negative   Vital Signs:  Patient profile:   70 year old female Height:      65 inches Weight:      125 pounds BMI:     20.88 BSA:     1.62 Pulse rate:   76 / minute Pulse rhythm:   regular BP sitting:  112 / 72  (left arm) Cuff size:   regular  Vitals Entered By: Ok Anis CMA (August 01, 2008 4:17 PM)  Physical Exam  Additional Exam:  She is a healthy-appearing female  skin: anicteric HEENT: normocephalic; PEERLA; no nasal or pharyngeal abnormalities neck: supple nodes: no cervical lymphadenopathy chest: clear to ausculatation and  percussion heart: no murmurs, gallops, or rubs abd: soft, nontender; BS normoactive; no abdominal masses, tenderness, organomegaly rectal: deferred ext: no cynanosis, clubbing, edema skeletal: no deformities neuro: oriented x 3; no focal abnormalities    Impression & Recommendations:  Problem # 1:  ABDOMINAL PAIN, RIGHT LOWER QUADRANT (ICD-789.03) Assessment Improved Symptoms are nonspecific and probably represent spasm.  Recommendations #1 hyomax p.r.n. #2 followup colonoscopy' one year (last colo 2001)  Patient was instructed to contact me if abdominal pain recurs and worsens  Problem # 2:  DYSPHAGIA UNSPECIFIED (ICD-787.20)  rule out esophageal stricture vs. motility disorder  Recommendations #1 upper endoscopy with dilatation as indicated #2 begin AcipHex 20 mg daily  Orders: EGD (EGD)  Problem # 3:  DYSPEPSIA&OTHER SPEC DISORDERS FUNCTION STOMACH (ICD-536.8) The patient likely has nonulcer dyspepsia which could be responsible for her nausea and bloating . Esophageal  reflux may also be an active factor.  Medications #1 begin AcipHex 20 mg daily  Patient Instructions: 1)  Conscious Sedation brochure given.  2)  Upper Endoscopy with Dilatation brochure given.  3)  Pick up your new rx's from your pharmacy today 4)  The medication list was reviewed and reconciled.  All changed / newly prescribed medications were explained.  A complete medication list was provided to the patient / caregiver. Prescriptions: HYOMAX-SL 0.125 MG SUBL (HYOSCYAMINE SULFATE) take 2 tablets sublingual every 4 hours as needed  #15 x 2   Entered and Authorized by:   Louis Meckel MD   Signed by:   Louis Meckel MD on 08/01/2008   Method used:   Electronically to        [facility] * (retail)       [address]       [place], [place]  [address]       Ph: [phone]       Fax: [phone]   RxID:   [id number] ACIPHEX 20 MG TBEC (RABEPRAZOLE SODIUM) take one tab daily one  half hour before records  #30 x 2   Entered and Authorized by:   Louis Meckel MD   Signed by:   Louis Meckel MD on 08/01/2008   Method used:   Electronically to        [facility] [address] * (retail)       [address]       [place], [place]  [address]       Ph: [phone]       Fax: [phone]   RxID:   [id number]

## 2010-02-24 NOTE — Assessment & Plan Note (Signed)
Summary: POSSIBLE UTI, TEMP AND CONGESTION/SCM   Vital Signs:  Patient Profile:   70 Years Old Female Weight:      124.50 pounds Temp:     97.6 degrees F oral Pulse rate:   72 / minute Pulse rhythm:   regular Resp:     17 per minute BP sitting:   120 / 72  (left arm) Cuff size:   large  Pt. in pain?   no  Vitals Entered By: Wendall Stade (January 10, 2007 4:56 PM)                  Chief Complaint:  sick for one month and URI symptoms.  History of Present Illness: sinus problems and infection for one month, then last week started coughing, larygitis, and having hesitancy , elevated temp, and headache  URI Symptoms      This is a 70 year old woman who presents with URI symptoms.  Diffuse headache.  The patient reports nasal congestion, sore throat, dry cough, and earache, but denies clear nasal discharge, purulent nasal discharge, productive cough, and sick contacts.  Associated symptoms include fever, fever of 100.5-103 degrees, use of an antipyretic, and response to antipyretic.  The patient denies stiff neck, dyspnea, wheezing, rash, vomiting, and diarrhea.  The patient also reports sneezing and headache.  The patient denies itchy watery eyes, itchy throat, seasonal symptoms, response to antihistamine, muscle aches, and severe fatigue.  The patient denies the following risk factors for Strep sinusitis: double sickening, tooth pain, Strep exposure, and tender adenopathy.    Current Allergies (reviewed today): ! PCN     Review of Systems  Eyes      Denies discharge, eye pain, red eye, and vision loss-1 eye.  ENT      Bilat facial pain ; nasal purulence  GU      Complains of urinary hesitancy.      Took Nyquil ; unsure whether this caused hesitancy   Physical Exam  General:     Well-developed,well-nourished,in no acute distress; alert,appropriate and cooperative throughout examination;uncomfortable-appearing.   Eyes:     No corneal or conjunctival inflammation  noted. EOMI. Perrla.  Vision grossly decreased OD (chronic) Ears:     L TM dull Nose:     Setum dislocated Mouth:     Mild erythema Lungs:     Normal respiratory effort, chest expands symmetrically. Lungs : minimal rhonchi; dry cough Cervical Nodes:     No lymphadenopathy noted Axillary Nodes:     No palpable lymphadenopathy    Impression & Recommendations:  Problem # 1:  BRONCHITIS-ACUTE (ICD-466.0)  The following medications were removed from the medication list:    Bactrim Ds 800-160 Mg Tabs (Sulfamethoxazole-trimethoprim) .[name]... 1 two times a day with 8 0z water  Her updated medication list for this problem includes:    Promethazine-codeine 6.25-10 Mg/16ml Syrp (Promethazine-codeine) .[name]... 1 -2 tsp q 4-6 hrs prn    Clarithromycin 500 Mg Tb24 (Clarithromycin) .[name]... 2 once daily with food   Complete Medication List: 1)  Tramadol Hcl 50 Mg Tabs (Tramadol hcl) .[name].. 1-2 q 4-6 hrs prn 2)  Promethazine-codeine 6.25-10 Mg/51ml Syrp (Promethazine-codeine) .[name].. 1 -2 tsp q 4-6 hrs prn 3)  Clarithromycin 500 Mg Tb24 (Clarithromycin) .... 2 once daily with food  Other Orders: T-Culture, Urine ([id number]) UA Dipstick w/o Micro ([address]11)   Patient Instructions: 1)  Drink as much fluid as you can tolerate for the next few days.    Prescriptions: CLARITHROMYCIN 500 MG  TB24 (CLARITHROMYCIN) 2 once daily with food  #20 x 0   Entered and Authorized by:   Marga Melnick MD   Signed by:   Marga Melnick MD on 01/10/2007   Method used:   Print then Give to Patient   RxID:   [id number] PROMETHAZINE-CODEINE 6.25-10 MG/5ML  SYRP (PROMETHAZINE-CODEINE) 1 -2 tsp q 4-6 hrs prn  #240cc x 0   Entered and Authorized by:   Marga Melnick MD   Signed by:   Marga Melnick MD on 01/10/2007   Method used:   Print then Give to Patient   RxID:   [id number]  ] Laboratory Results   Urine Tests  Date/Time Recieved: January 10, 2007 5:11 PM   Routine Urinalysis   Color: lt.  yellow Glucose: negative   (Normal Range: Negative) Bilirubin: negative   (Normal Range: Negative) Ketone: negative   (Normal Range: Negative) Spec. Gravity: 1.020   (Normal Range: 1.003-1.035) Blood: moderate   (Normal Range: Negative) pH: 6.5   (Normal Range: 5.0-8.0) Protein: negative   (Normal Range: Negative) Urobilinogen: negative   (Normal Range: 0-1) Nitrite: negative   (Normal Range: Negative) Leukocyte Esterace: negative   (Normal Range: Negative)    Comments: sent for culture 599.0

## 2010-02-24 NOTE — Assessment & Plan Note (Signed)
Summary: fatigue/cbs   Vital Signs:  Patient Profile:   70 Years Old Female Weight:      131.75 pounds Pulse rate:   60 / minute Resp:     14 per minute BP sitting:   120 / 70  Vitals Entered By: Kandice Hams (July 10, 2007 4:20 PM)                 Chief Complaint:  pt c/o fatigued has sinus issues 3 wks ago now just tired gives out easily took otc meds which she thought cleared it up.  History of Present Illness: URI symptoms essentially; no facial apin, purulence, or fever. Fatigue for 3 weeks , same period as URI but not improving. PMH reviewed : monoclonal gammopathy followed by Dr Arbutus Ped; ? significance. See pertinent ROS.    Current Allergies: ! PCN  Past Medical History:    Hyperlipidemia ; Vertical diplopia ; MVP    Anemia-NOS, PMH, Dr Arbutus Ped    Monoclonal gammopathy;Dr "  Past Surgical History:    Hysterectomy & BSO for pain,prolapse ; G3 P2    BSO for benign growths    Hemorrhoidectomy    Appendectomy    Cath 2005:neg    Colonoscopy neg     Review of Systems  General      See HPI      Denies chills, fever, sweats, and weight loss.  Eyes      Denies discharge, eye pain, and red eye.      Chronic diplopoia; seen @ [facility]      Denies ear discharge, earache, nasal congestion, and sinus pressure.  CV      Occa palpitations  Resp      Denies cough, shortness of breath, and sputum productive.  GI      Denies bloody stools, constipation, dark tarry stools, and diarrhea.  GU      Denies discharge, dysuria, and hematuria.  MS      Denies joint pain, joint redness, and joint swelling.  Derm      Denies dryness and hair loss.      Nails fragile  Psych      Denies anxiety, depression, easily angered, easily tearful, and irritability.  Endo      Denies cold intolerance, excessive hunger, excessive thirst, excessive urination, heat intolerance, polyuria, and weight change.   Physical Exam  General:  Well-developed,well-nourished,in no acute distress; alert,appropriate and cooperative throughout examination Ears:     External ear exam shows no significant lesions or deformities.  Otoscopic examination reveals clear canals, tympanic membranes are intact bilaterally without bulging, retraction, inflammation or discharge. Hearing is grossly normal bilaterally. Nose:     External nasal examination shows no deformity or inflammation. Nasal mucosa are pink and moist without lesions or exudates. Mouth:     Oral mucosa and oropharynx without lesions or exudates.  Teeth in good repair. Neck:     No deformities, masses, or tenderness noted.Thyroid surface  irregular ; R lobe > L Lungs:     Normal respiratory effort, chest expands symmetrically. Lungs are clear to auscultation, no crackles or wheezes. Heart:     normal rate, regular rhythm, no gallop, no rub, no JVD, no HJR, and grade 1 /6 systolic murmur.   Abdomen:     Bowel sounds positive,abdomen soft and non-tender without masses, organomegaly or hernias noted. Skin:     Intact without suspicious lesions or rashes Cervical Nodes:     No lymphadenopathy noted Axillary  Nodes:     No palpable lymphadenopathy Psych:     memory intact for recent and remote, normally interactive, good eye contact, not anxious appearing, and not depressed appearing.      Impression & Recommendations:  Problem # 1:  FATIGUE (ICD-780.79)  Problem # 2:  URI (ICD-465.9) Resolving Her updated medication list for this problem includes:    Promethazine-codeine 6.25-10 Mg/75ml Syrp (Promethazine-codeine) .[name]... 1 -2 tsp q 4-6 hrs prn   Problem # 3:  GOITER, UNSPECIFIED (ICD-240.9)  Orders: Radiology Referral (Radiology)   Problem # 4:  HYPERLIPIDEMIA (ICD-272.4)  Problem # 5:  MONOCLONAL GAMMOPATHY (ICD-273.1)  Complete Medication List: 1)  Tramadol Hcl 50 Mg Tabs (Tramadol hcl) .[name].. 1-2 q 4-6 hrs prn 2)  Promethazine-codeine 6.25-10 Mg/26ml Syrp  (Promethazine-codeine) .[name].. 1 -2 tsp q 4-6 hrs prn 3)  Clarithromycin 500 Mg Tb24 (Clarithromycin) .... 2 once daily with food 4)  Nitrofurantoin Monohyd Macro 100 Mg Caps (Nitrofurantoin monohyd macro) .[name].. 1 bid   Patient Instructions: 1)   Codes for labs : 780.79, 272.4, 240.9 @ [address] 07/12/07.[name] Complete stool cards.BMP prior to visit, ICD-9: 2)  Hepatic Panel prior to visit, ICD-9: 3)  TSH, free T4 prior to visit, ICD-9: 4)  CBC w/ Diff prior to visit, ICD-9: 5)  Lipid Panel prior to visit, ICD-9:   ]

## 2010-02-24 NOTE — Letter (Signed)
Summary: Results Follow up Letter  [facility] [place]  [address] [place], [place] [address]   Phone: [phone]  Fax: [phone]    07/25/2007 MRN: [id number]  [name] [address] [place], [place]  [address]  Dear [name],  The following are the results of your recent test(s):  Test         Result    Pap Smear:        Normal _____  Not Normal _____ Comments: ______________________________________________________ Cholesterol: LDL(Bad cholesterol):         Your goal is less than:         HDL (Good cholesterol):       Your goal is more than: Comments:  ______________________________________________________ Mammogram:        Normal _____  Not Normal _____ Comments:  ___________________________________________________________________ Hemoccult:        Normal _____  Not normal _______ Comments:    _____________________________________________________________________ Other Tests:    We routinely do not discuss normal results over the telephone.  If you desire a copy of the results, or you have any questions about this information we can discuss them at your next office visit. See labs and results  Sincerely,

## 2010-02-24 NOTE — Assessment & Plan Note (Signed)
Summary: EGD F/U.Marland KitchenMarland KitchenAS.   History of Present Illness Visit Type: Follow-up Visit Primary GI MD: Melvia Heaps MD [facility] Primary Provider: Marga Melnick, MD Requesting Provider: Marga Melnick, MD Chief Complaint: follow up after EGD, pt states she is feeling well, she has no GI complaints History of Present Illness:   Ms. [name] has returned following upper endoscopy with dilatation.  She no longer has dysphagia.  She is able to sleep in a recumbent position without the sensation of chest or throat tightness.  Her rare lower bowel pain has responded well to hyomax.   GI Review of Systems      Denies abdominal pain, acid reflux, belching, bloating, chest pain, dysphagia with liquids, dysphagia with solids, heartburn, loss of appetite, nausea, vomiting, vomiting blood, weight loss, and  weight gain.        Denies anal fissure, black tarry stools, change in bowel habit, constipation, diarrhea, diverticulosis, fecal incontinence, heme positive stool, hemorrhoids, irritable bowel syndrome, jaundice, light color stool, liver problems, rectal bleeding, and  rectal pain.    Current Medications (verified): 1)  Tramadol Hcl 50 Mg  Tabs (Tramadol Hcl) .[name].. 1-2 Q 4-6 Hrs Prn 2)  Hyomax-Sl 0.125 Mg Subl (Hyoscyamine Sulfate) .... Take 2 Tablets Sublingual Every 4 Hours As Needed  Allergies (verified): 1)  ! Pcn  Past History:  Past Medical History: Reviewed history from 07/10/2007 and no changes required. Hyperlipidemia ; Vertical diplopia ; MVP Anemia-NOS, PMH, Dr Arbutus Ped Monoclonal gammopathy;Dr "  Past Surgical History: Reviewed history from 07/10/2007 and no changes required. Hysterectomy & BSO for pain,prolapse ; G3 P2 BSO for benign growths Hemorrhoidectomy Appendectomy Cath 2005:neg Colonoscopy neg  Family History: Reviewed history from 08/01/2008 and no changes required. Family History of Heart Disease: Mother and brother Family History of Diabetes: Mother and brother  No FH  of Colon Cancer:  Social History: Reviewed history from 08/01/2008 and no changes required. 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  Alcohol Use - no Illicit Drug Use - no Patient does not get regular exercise.  Daily Caffeine Use: 3 cups of coffee daily   Review of Systems  The patient denies allergy/sinus, anemia, anxiety-new, arthritis/joint pain, back pain, blood in urine, breast changes/lumps, confusion, cough, coughing up blood, depression-new, fainting, fatigue, fever, headaches-new, hearing problems, heart murmur, heart rhythm changes, itching, menstrual pain, muscle pains/cramps, night sweats, nosebleeds, pregnancy symptoms, shortness of breath, skin rash, sleeping problems, sore throat, swelling of feet/legs, swollen lymph glands, thirst - excessive, urination - excessive, urination changes/pain, urine leakage, vision changes, and voice change.    Vital Signs:  Patient profile:   70 year old female Height:      65 inches Weight:      127 pounds BMI:     21.21 Pulse rate:   80 / minute Pulse rhythm:   regular BP sitting:   110 / 70  (left arm) Cuff size:   regular  Vitals Entered By: Francee Piccolo CMA [facility]) (September 13, 2008 3:22 PM)   Impression & Recommendations:  Problem # 1:  DYSPHAGIA UNSPECIFIED (ICD-787.20) Assessment Improved Plan repeat dilatation p.r.n.  Problem # 2:  ABDOMINAL PAIN, RIGHT LOWER QUADRANT (ICD-789.03) Assessment: Improved plan to continue hyomax p.r.n.

## 2010-02-24 NOTE — Progress Notes (Signed)
Summary: Moviprep sent to [name]  Phone Note Call from Patient Call back at [facility] Phone [phone]   Call For: Dr Arlyce Dice Reason for Call: Refill Medication Summary of Call: Says she was not asked which pharmacy she uses and has not used [facility] in a long time. Would like her Moviprep sent to [facility] in [place]. Initial call taken by: Leanor Kail [facility],  October 24, 2009 10:50 AM  Follow-up for Phone Call        Sent to [facility].Marland KitchenMarland KitchenMarland KitchenSee office note Follow-up by: Merri Ray CMA [facility]),  October 24, 2009 10:54 AM

## 2010-02-24 NOTE — Assessment & Plan Note (Signed)
Summary: DYSPHAGIA, ABNORMAL SWALLOWING STUDY   F/U ENDO/DIL.        D...   History of Present Illness Visit Type: Follow-up Visit Primary GI MD: Melvia Heaps MD [facility] Primary Provider: Marga Melnick, MD Requesting Provider: n/a Chief Complaint: f/u EGD, abn swallowing study, dysphagia-- swallowing is better sill feels like "something in throat" History of Present Illness:   [name] has returned following upper endoscopy with dilatation of an esophageal stricture.  She reports a sensation  of a  foreign object in the back of her throat with swallowing  but this has not interfered with her swallowing.   GI Review of Systems      Denies abdominal pain, acid reflux, belching, bloating, chest pain, dysphagia with liquids, dysphagia with solids, heartburn, loss of appetite, nausea, vomiting, vomiting blood, weight loss, and  weight gain.        Denies anal fissure, black tarry stools, change in bowel habit, constipation, diarrhea, diverticulosis, fecal incontinence, heme positive stool, hemorrhoids, irritable bowel syndrome, jaundice, light color stool, liver problems, rectal bleeding, and  rectal pain.    Current Medications (verified): 1)  Tramadol Hcl 50 Mg  Tabs (Tramadol Hcl) .[name].. 1-2 Q 4-6 Hrs Prn 2)  Hyomax-Sl 0.125 Mg Subl (Hyoscyamine Sulfate) .... Take 2 Tablets Sublingual Every 4 Hours As Needed 3)  Omeprazole 20 Mg Cpdr (Omeprazole) .[name].. 1 Two Times A Day Pre Meals 4)  Nitrostat 0.3 Mg Subl (Nitroglycerin) .... Prn 5)  Aspirin 81 Mg Chew (Aspirin)  Allergies (verified): 1)  ! Pcn  Past History:  Past Medical History: Hyperlipidemia ; Vertical diplopia ; MVP Anemia-NOS, PMH, Dr Arbutus Ped Monoclonal gammopathy;Dr " Esophageal Stricture  Past Surgical History: Reviewed history from 09/08/2009 and no changes required. Hysterectomy & BSO for pain,prolapse ; G3 P2 BSO for benign growths Hemorrhoidectomy Appendectomy Cath 2005:neg Colonoscopy negative   Family  History: Reviewed history from 08/01/2008 and no changes required. Family History of Heart Disease: Mother and brother Family History of Diabetes: Mother and brother  No FH of Colon Cancer:  Social History: Reviewed history from 08/01/2008 and no changes required. 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  Alcohol Use - no Illicit Drug Use - no Patient does not get regular exercise.  Daily Caffeine Use: 3 cups of coffee daily   Review of Systems  The patient denies allergy/sinus, anemia, anxiety-new, arthritis/joint pain, back pain, blood in urine, breast changes/lumps, change in vision, confusion, cough, coughing up blood, depression-new, fainting, fatigue, fever, headaches-new, hearing problems, heart murmur, heart rhythm changes, itching, menstrual pain, muscle pains/cramps, night sweats, nosebleeds, pregnancy symptoms, shortness of breath, skin rash, sleeping problems, sore throat, swelling of feet/legs, swollen lymph glands, thirst - excessive , urination - excessive , urination changes/pain, urine leakage, vision changes, and voice change.    Vital Signs:  Patient profile:   70 year old female Height:      65 inches Weight:      133 pounds BMI:     22.21 Pulse rate:   78 / minute Pulse rhythm:   regular BP sitting:   94 / 54  (left arm)  Vitals Entered By: Chales Abrahams CMA [facility] Dull) (October 22, 2009 2:34 PM)   Impression & Recommendations:  Problem # 1:  DYSPHAGIA UNSPECIFIED (ICD-787.20) Assessment Improved  Plan repeat dilatation as needed  Orders: Colonoscopy (Colon)  Problem # 2:  SCREENING, COLON CANCER (ICD-V76.51)  Last colonoscopy was 2001.  She is due for a followup screening exam.  Orders: Colonoscopy (Colon)  Patient Instructions: 1)  Copy sent to : Marga Melnick, MD 2)  Colonoscopy and Flexible Sigmoidoscopy brochure given.  3)  Conscious Sedation brochure given.  4)  Your Colonoscopy is scheduled on 11/03/2009 at 3:30pm 5)  The  medication list was reviewed and reconciled.  All changed / newly prescribed medications were explained.  A complete medication list was provided to the patient / caregiver. Prescriptions: MOVIPREP 100 GM  SOLR (PEG-KCL-NACL-NASULF-NA ASC-C) As per prep instructions.  #1 x 0   Entered by:   Merri Ray CMA (AAMA)   Authorized by:   Louis Meckel MD   Signed by:   Merri Ray CMA (AAMA) on 10/24/2009   Method used:   Electronically to        [id number]* (retail)       [address]       [place], [place]  [address]       Ph: [phone]       Fax: [phone]   RxID:   [id number] MOVIPREP 100 GM  SOLR (PEG-KCL-NACL-NASULF-NA ASC-C) As per prep instructions.  #1 x 0   Entered by:   Merri Ray CMA (AAMA)   Authorized by:   Louis Meckel MD   Signed by:   Merri Ray CMA (AAMA) on 10/22/2009   Method used:   Electronically to        [facility] [address] * (retail)       [address]       [place], [place]  [address]       Ph: [phone]       Fax: [phone]   RxID:   [id number]  One on [name] sent in Error
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
## 2010-02-24 NOTE — Progress Notes (Signed)
Summary: refill  Phone Note Refill Request Message from:  Fax from Pharmacy  Refills Requested: Medication #1:  TRAMADOL HCL 50 MG  TABS 1-2 q 4-6 hrs prn sams Octavio Manns - [phone]  Initial call taken by: Okey Regal [place],  October 15, 2009 1:26 PM  Follow-up for Phone Call        Called in rx to the pharmacy Follow-up by: Shonna Chock CMA,  October 15, 2009 2:52 PM

## 2010-02-25 HISTORY — PX: BREAST LUMPECTOMY: SHX2

## 2010-02-26 NOTE — Op Note (Signed)
Summary: Order for Breast Biopsy  Order for Breast Biopsy   Imported By: Maryln Gottron 02/18/2010 14:32:34  _____________________________________________________________________  External Attachment:    Type:   Image     Comment:   External Document

## 2010-02-26 NOTE — Op Note (Signed)
Summary: US Guided Core Biopsy & Digital Mammogram/[facility]  US Guided Core Biopsy & Digital Mammogram/[facility]   Imported By: Lanelle Bal 02/20/2010 14:24:13  _____________________________________________________________________  External Attachment:    Type:   Image     Comment:   External Document

## 2010-02-27 NOTE — Letter (Signed)
Summary: [facility]  [facility]   Imported By: Lanelle Bal 09/20/2007 09:53:20  _____________________________________________________________________  External Attachment:    Type:   Image     Comment:   External Document

## 2010-03-03 ENCOUNTER — Encounter (HOSPITAL_COMMUNITY)
Admission: RE | Admit: 2010-03-03 | Discharge: 2010-03-03 | Disposition: A | Discharge status: Home / Self Care | Payer: Medicare Other | Source: Ambulatory Visit | Attending: General Surgery | Admitting: General Surgery

## 2010-03-03 ENCOUNTER — Other Ambulatory Visit (HOSPITAL_COMMUNITY): Payer: Self-pay | Admitting: General Surgery

## 2010-03-03 ENCOUNTER — Ambulatory Visit (HOSPITAL_COMMUNITY)
Admission: RE | Admit: 2010-03-03 | Discharge: 2010-03-03 | Disposition: A | Discharge status: Home / Self Care | Payer: Medicare Other | Source: Ambulatory Visit | Attending: General Surgery | Admitting: General Surgery

## 2010-03-03 DIAGNOSIS — C50911 Malignant neoplasm of unspecified site of right female breast: Secondary | ICD-10-CM

## 2010-03-03 DIAGNOSIS — Z01818 Encounter for other preprocedural examination: Secondary | ICD-10-CM | POA: Insufficient documentation

## 2010-03-03 LAB — SURGICAL PCR SCREEN: MRSA, PCR: NEGATIVE

## 2010-03-03 LAB — COMPREHENSIVE METABOLIC PANEL
ALT: 17 U/L (ref 0–35)
AST: 30 U/L (ref 0–37)
Albumin: 4.2 g/dL (ref 3.5–5.2)
Alkaline Phosphatase: 57 U/L (ref 39–117)
BUN: 8 mg/dL (ref 6–23)
CO2: 30 mEq/L (ref 19–32)
Calcium: 9.9 mg/dL (ref 8.4–10.5)
Chloride: 104 mEq/L (ref 96–112)
Creatinine, Ser: 0.83 mg/dL (ref 0.4–1.2)
GFR calc non Af Amer: >60 mL/min (ref >60)
Glucose, Bld: 88 mg/dL (ref 70–99)
Potassium: 4.1 mEq/L (ref 3.5–5.1)
Sodium: 142 mEq/L (ref 135–145)
Total Bilirubin: 0.7 mg/dL (ref 0.3–1.2)
Total Protein: 7.8 g/dL (ref 6.0–8.3)

## 2010-03-03 LAB — DIFFERENTIAL
Basophils Absolute: 0.1 10*3/uL (ref 0.0–0.1)
Basophils Relative: 1 % (ref 0–1)
Eosinophils Absolute: 0.5 10*3/uL (ref 0.0–0.7)
Eosinophils Relative: 7 % — ABNORMAL HIGH (ref 0–5)
Lymphocytes Relative: 38 % (ref 12–46)
Lymphs Abs: 3 10*3/uL (ref 0.7–4.0)
Monocytes Absolute: 0.5 10*3/uL (ref 0.1–1.0)
Monocytes Relative: 6 % (ref 3–12)
Neutro Abs: 3.8 10*3/uL (ref 1.7–7.7)
Neutrophils Relative %: 48 % (ref 43–77)

## 2010-03-03 LAB — CBC
HCT: 40.5 % (ref 36.0–46.0)
Hemoglobin: 13.4 g/dL (ref 12.0–15.0)
MCH: 32.2 pg (ref 26.0–34.0)
MCHC: 33.1 g/dL (ref 30.0–36.0)
MCV: 97.4 fL (ref 78.0–100.0)
Platelets: 263 10*3/uL (ref 150–400)
RBC: 4.16 MIL/uL (ref 3.87–5.11)
RDW: 13.9 % (ref 11.5–15.5)
WBC: 8 10*3/uL (ref 4.0–10.5)

## 2010-03-03 LAB — CANCER ANTIGEN 27.29: CA 27.29: 19 U/mL (ref 0–39)

## 2010-03-04 NOTE — Op Note (Signed)
Summary: Addendum to US Guided Biopsy & Digital Mammogram/[facility]  Addendum to US Guided Biopsy & Digital Mammogram/[facility]   Imported By: Lanelle Bal 02/25/2010 10:55:46  _____________________________________________________________________  External Attachment:    Type:   Image     Comment:   External Document

## 2010-03-05 ENCOUNTER — Ambulatory Visit (HOSPITAL_COMMUNITY)
Admission: RE | Admit: 2010-03-05 | Discharge: 2010-03-05 | Disposition: A | Discharge status: Home / Self Care | Payer: Medicare Other | Source: Ambulatory Visit | Attending: General Surgery | Admitting: General Surgery

## 2010-03-05 ENCOUNTER — Observation Stay (HOSPITAL_COMMUNITY)
Admission: RE | Admit: 2010-03-05 | Discharge: 2010-03-05 | Disposition: A | Discharge status: Home / Self Care | Payer: Medicare Other | Source: Ambulatory Visit | Attending: General Surgery | Admitting: General Surgery

## 2010-03-05 ENCOUNTER — Other Ambulatory Visit: Payer: Self-pay | Admitting: General Surgery

## 2010-03-05 ENCOUNTER — Encounter: Payer: Self-pay | Admitting: Internal Medicine

## 2010-03-05 DIAGNOSIS — Z880 Allergy status to penicillin: Secondary | ICD-10-CM | POA: Insufficient documentation

## 2010-03-05 DIAGNOSIS — C50919 Malignant neoplasm of unspecified site of unspecified female breast: Secondary | ICD-10-CM

## 2010-03-05 DIAGNOSIS — Z01810 Encounter for preprocedural cardiovascular examination: Secondary | ICD-10-CM | POA: Insufficient documentation

## 2010-03-05 DIAGNOSIS — Z01818 Encounter for other preprocedural examination: Secondary | ICD-10-CM | POA: Insufficient documentation

## 2010-03-05 MED ORDER — TECHNETIUM TC 99M SULFUR COLLOID FILTERED
1.0000 | Freq: Once | INTRAVENOUS | Status: AC | PRN
  Administered 2010-03-05: 1 via INTRADERMAL

## 2010-03-08 NOTE — Op Note (Signed)
NAMEJESSIC, [name]             ACCOUNT NO.:  [id number]  MEDICAL RECORD NO.:  [id number]           PATIENT TYPE:  I  LOCATION:  2550                         FACILITY:  [facility]  PHYSICIAN:  Juanetta Gosling, MDDATE OF BIRTH:  1940/05/12  DATE OF PROCEDURE:  03/05/2010 DATE OF DISCHARGE:                              OPERATIVE REPORT   PREOPERATIVE DIAGNOSIS:  Clinical stage I right breast cancer.  POSTOPERATIVE DIAGNOSIS:  Clinical stage I right breast cancer.  PROCEDURE: 1. Right breast wire-guided lumpectomy. 2. Injection of methylene blue dye for sentinel node identification. 3. Right axillary sentinel node biopsy.  SURGEON:  Juanetta Gosling, MD  ASSISTANT:  None.  ANESTHESIA:  General.  SUPERVISING ANESTHESIOLOGIST:  Quita Skye. Krista Blue, MD  SPECIMENS: 1. Right breast tissue marked with paint kit. 2. Right axillary sentinel node x1.  SPECIMEN:  There were two nodes in this packet that counts for 683.  Disposition of specimens to Pathology.  ESTIMATED BLOOD LOSS:  Minimal.  DRAINS:  None.  COMPLICATIONS:  None.  DISPOSITION:  To recovery room in stable condition.  INDICATIONS:  Ms. [name] is a 70 year old female who underwent a screening mammogram in December with an abnormality noted in the right breast with the mass measuring about 8 mm.  This showed up on ultrasound as well as an MRI looking like it was about 1 cm in size with no other abnormalities.  Pathology showed this to be invasive ductal carcinoma, ER/PR positive and HER2 was not amplified.  We discussed all of her options, and we decided on performing breast conservation therapy with a sentinel node biopsy.  PROCEDURE:  After informed consent was obtained, the patient was first taken to the breast center where she had a wire placed by the mammograms available for my review when she came for her operation.  She was injected with technetium in the standard periareolar fashion. Sequential  compression devices were placed on lower extremities prior to induction of anesthesia.  She was administered 1 g of intravenous vancomycin prior to beginning due to a PENICILLIN allergy.  She was then placed and general anesthesia without complication.  Her right breast and axilla were then prepped and draped in standard sterile surgical fashion.  Surgical time-out was then performed.  I made a curvilinear incision in the upper midportion of her breast.  I used cautery to remove the wire and the surrounding tissue all the way down to the pectoralis muscle.  This included the pectoralis fascia. Hemostasis was obtained.  I placed three clips deep and one clip in each cardinal position around this area, and packed this with a sponge.  A Faxitron mammogram was taken.  It appeared the mass and the clip were right in the middle of the specimen.  This was then sent to Radiology who confirmed this as well.  I then made an incision in her axilla and located two hot and blue nodes with the counts being 683, the background count was 5, and there were no other blue dye noted.  I passed these off the table as a specimen.  Hemostasis was observed.  I closed the axillary  fascia with 2-0 Vicryl, the dermis with 3-0 Vicryl, skin with the 4-0 Monocryl in subcuticular fashion.  I closed the breast tissue, and I mobilized a little bit and brought the deep tissue together with a 2-0 Vicryl, the subcutaneous tissue and dermis with 3-0 Vicryl sutures, and the skin with 4-0 Monocryl.  I placed Dermabond overlying her incision due to her difficulty with Steri-Strips in the past.  A breast binder was also placed.  She tolerated this well, was extubated in the operating room, and transferred to the recovery room in stable condition.     Juanetta Gosling, MD     MCW/MEDQ  D:  03/05/2010  T:  03/06/2010  Job:  [id number]  cc:   Titus Dubin. Alwyn Ren, MD,FACP,FCCP Lajuana Matte, MD  Electronically Signed  by Emelia Loron MD on 03/07/2010 03:12:30 PM

## 2010-03-11 ENCOUNTER — Ambulatory Visit: Payer: Medicare Other | Attending: Radiation Oncology | Admitting: Radiation Oncology

## 2010-03-11 ENCOUNTER — Encounter: Payer: Self-pay | Admitting: Internal Medicine

## 2010-03-11 DIAGNOSIS — C50919 Malignant neoplasm of unspecified site of unspecified female breast: Secondary | ICD-10-CM | POA: Insufficient documentation

## 2010-03-11 DIAGNOSIS — Z9079 Acquired absence of other genital organ(s): Secondary | ICD-10-CM | POA: Insufficient documentation

## 2010-03-11 DIAGNOSIS — R5383 Other fatigue: Secondary | ICD-10-CM | POA: Insufficient documentation

## 2010-03-11 DIAGNOSIS — Y842 Radiological procedure and radiotherapy as the cause of abnormal reaction of the patient, or of later complication, without mention of misadventure at the time of the procedure: Secondary | ICD-10-CM | POA: Insufficient documentation

## 2010-03-11 DIAGNOSIS — Z170 Estrogen receptor positive status [ER+]: Secondary | ICD-10-CM | POA: Insufficient documentation

## 2010-03-11 DIAGNOSIS — Z510 Encounter for antineoplastic radiation therapy: Secondary | ICD-10-CM | POA: Insufficient documentation

## 2010-03-11 DIAGNOSIS — L589 Radiodermatitis, unspecified: Secondary | ICD-10-CM | POA: Insufficient documentation

## 2010-03-11 DIAGNOSIS — Z79899 Other long term (current) drug therapy: Secondary | ICD-10-CM | POA: Insufficient documentation

## 2010-03-11 DIAGNOSIS — D472 Monoclonal gammopathy: Secondary | ICD-10-CM | POA: Insufficient documentation

## 2010-03-11 DIAGNOSIS — Z90710 Acquired absence of both cervix and uterus: Secondary | ICD-10-CM | POA: Insufficient documentation

## 2010-03-11 DIAGNOSIS — R5381 Other malaise: Secondary | ICD-10-CM | POA: Insufficient documentation

## 2010-03-12 NOTE — Letter (Signed)
Summary: [facility]   Imported By: Maryln Gottron 03/06/2010 10:17:38  _____________________________________________________________________  External Attachment:    Type:   Image     Comment:   External Document

## 2010-03-18 NOTE — Op Note (Signed)
Summary: Wire Localization and biopsy/[facility]  Wire Localization and biopsy/[facility]   Imported By: Maryln Gottron 03/11/2010 14:56:40  _____________________________________________________________________  External Attachment:    Type:   Image     Comment:   External Document

## 2010-03-24 ENCOUNTER — Encounter: Payer: Self-pay | Admitting: Internal Medicine

## 2010-03-24 NOTE — Letter (Signed)
Summary: [facility]  [facility]   Imported By: Maryln Gottron 03/17/2010 15:33:50  _____________________________________________________________________  External Attachment:    Type:   Image     Comment:   External Document

## 2010-03-30 ENCOUNTER — Other Ambulatory Visit: Payer: Self-pay | Admitting: Oncology

## 2010-03-30 ENCOUNTER — Encounter (HOSPITAL_BASED_OUTPATIENT_CLINIC_OR_DEPARTMENT_OTHER): Payer: Medicare Other | Admitting: Oncology

## 2010-03-30 DIAGNOSIS — C50419 Malignant neoplasm of upper-outer quadrant of unspecified female breast: Secondary | ICD-10-CM

## 2010-03-30 DIAGNOSIS — C50919 Malignant neoplasm of unspecified site of unspecified female breast: Secondary | ICD-10-CM

## 2010-03-30 DIAGNOSIS — D472 Monoclonal gammopathy: Secondary | ICD-10-CM

## 2010-03-30 LAB — COMPREHENSIVE METABOLIC PANEL
ALT: 15 U/L (ref 0–35)
AST: 23 U/L (ref 0–37)
Albumin: 3.8 g/dL (ref 3.5–5.2)
Alkaline Phosphatase: 81 U/L (ref 39–117)
BUN: 6 mg/dL (ref 6–23)
CO2: 29 mEq/L (ref 19–32)
Calcium: 9.3 mg/dL (ref 8.4–10.5)
Chloride: 102 mEq/L (ref 96–112)
Creatinine, Ser: 0.68 mg/dL (ref 0.40–1.20)
Glucose, Bld: 94 mg/dL (ref 70–99)
Potassium: 3.6 mEq/L (ref 3.5–5.3)
Sodium: 140 mEq/L (ref 135–145)
Total Bilirubin: 1 mg/dL (ref 0.3–1.2)
Total Protein: 8 g/dL (ref 6.0–8.3)

## 2010-03-30 LAB — CBC WITH DIFFERENTIAL/PLATELET
BASO%: 0.4 % (ref 0.0–2.0)
Basophils Absolute: 0 10*3/uL (ref 0.0–0.1)
EOS%: 7.1 % — ABNORMAL HIGH (ref 0.0–7.0)
Eosinophils Absolute: 0.5 10*3/uL (ref 0.0–0.5)
HCT: 38.1 % (ref 34.8–46.6)
HGB: 12.8 g/dL (ref 11.6–15.9)
LYMPH%: 26.1 % (ref 14.0–49.7)
MCH: 33 pg (ref 25.1–34.0)
MCHC: 33.7 g/dL (ref 31.5–36.0)
MCV: 97.8 fL (ref 79.5–101.0)
MONO#: 0.2 10*3/uL (ref 0.1–0.9)
MONO%: 3.6 % (ref 0.0–14.0)
NEUT#: 4.3 10*3/uL (ref 1.5–6.5)
NEUT%: 62.8 % (ref 38.4–76.8)
Platelets: 345 10*3/uL (ref 145–400)
RBC: 3.89 10*6/uL (ref 3.70–5.45)
RDW: 13.9 % (ref 11.2–14.5)
WBC: 6.8 10*3/uL (ref 3.9–10.3)
lymph#: 1.8 10*3/uL (ref 0.9–3.3)

## 2010-03-30 LAB — VITAMIN D 25 HYDROXY (VIT D DEFICIENCY, FRACTURES): Vit D, 25-Hydroxy: 37 ng/mL (ref 30–89)

## 2010-03-30 LAB — CANCER ANTIGEN 27.29: CA 27.29: 17 U/mL (ref 0–39)

## 2010-03-30 LAB — LACTATE DEHYDROGENASE: LDH: 134 U/L (ref 94–250)

## 2010-03-31 ENCOUNTER — Other Ambulatory Visit: Payer: Self-pay | Admitting: Oncology

## 2010-03-31 DIAGNOSIS — C50919 Malignant neoplasm of unspecified site of unspecified female breast: Secondary | ICD-10-CM

## 2010-04-03 ENCOUNTER — Encounter: Payer: Self-pay | Admitting: Oncology

## 2010-04-08 ENCOUNTER — Other Ambulatory Visit: Payer: Medicare Other

## 2010-04-08 ENCOUNTER — Encounter: Payer: Medicare Other | Admitting: Internal Medicine

## 2010-04-10 ENCOUNTER — Ambulatory Visit
Admission: RE | Admit: 2010-04-10 | Discharge: 2010-04-10 | Disposition: A | Discharge status: Home / Self Care | Payer: Medicare Other | Source: Ambulatory Visit | Attending: Oncology | Admitting: Oncology

## 2010-04-10 DIAGNOSIS — C50919 Malignant neoplasm of unspecified site of unspecified female breast: Secondary | ICD-10-CM

## 2010-04-14 NOTE — Letter (Signed)
Summary: Dr. Dwain Sarna visit note  Dr. Dwain Sarna visit note   Imported By: Kassie Mends 04/09/2010 10:00:53  _____________________________________________________________________  External Attachment:    Type:   Image     Comment:   External Document

## 2010-04-17 ENCOUNTER — Encounter: Payer: Self-pay | Admitting: Cardiovascular Disease

## 2010-04-28 ENCOUNTER — Encounter: Payer: Self-pay | Admitting: Cardiovascular Disease

## 2010-04-28 ENCOUNTER — Ambulatory Visit (INDEPENDENT_AMBULATORY_CARE_PROVIDER_SITE_OTHER): Payer: Medicare Other | Admitting: Cardiovascular Disease

## 2010-04-28 VITALS — BP 115/70 | HR 78 | Resp 14 | Ht 64.0 in | Wt 126.0 lb

## 2010-04-28 DIAGNOSIS — R0989 Other specified symptoms and signs involving the circulatory and respiratory systems: Secondary | ICD-10-CM

## 2010-04-28 DIAGNOSIS — R0600 Dyspnea, unspecified: Secondary | ICD-10-CM

## 2010-04-28 DIAGNOSIS — D472 Monoclonal gammopathy: Secondary | ICD-10-CM

## 2010-04-28 DIAGNOSIS — E785 Hyperlipidemia, unspecified: Secondary | ICD-10-CM

## 2010-04-28 DIAGNOSIS — R0609 Other forms of dyspnea: Secondary | ICD-10-CM

## 2010-04-28 NOTE — Assessment & Plan Note (Signed)
F/U Dr Donnie Coffin  SPEP, UPEP and bone survey per protocol

## 2010-04-28 NOTE — Patient Instructions (Signed)
Your physician recommends that you schedule a follow-up appointment as needed with Dr. Nishan  Your physician has requested that you have an echocardiogram. Echocardiography is a painless test that uses sound waves to create images of your heart. It provides your doctor with information about the size and shape of your heart and how well your heart's chambers and valves are working. This procedure takes approximately one hour. There are no restrictions for this procedure.    

## 2010-04-28 NOTE — Assessment & Plan Note (Signed)
Doubt cardiac etiology.  Check echo.  Normal cardiopulmonary exam

## 2010-04-28 NOTE — Progress Notes (Signed)
70 yo history of multiple myeloma and recent breast CA with right mastectomy.  Dyspnea for over a year.  Sees Dr Alwyn Ren and has history of MVP.  Previous echo's during cancer Rx normal EF.  Previous cath many years ago per patient normal.  Does not recall who she saw.  Has some increased secretions at night and trouble sleeping.  ? PND.  No edema.  Atypical muscular chest pain. Nonexertional intermitant and fleeting.  Currently undergoing XRT Rx for breast CA.  No palpitations, syncope edema.  Nonsmoker with no history of chronic lung disease.  Compliant with meds  ROS: Denies fever, malais, weight loss, blurry vision, decreased visual acuity, cough, sputum, SOB, hemoptysis, pleuritic pain, palpitaitons, heartburn, abdominal pain, melena, lower extremity edema, claudication, or rash.   General: Affect appropriate Healthy:  appears stated age HEENT: normal Neck supple with no adenopathy JVP normal no bruits no thyromegaly Lungs clear with no wheezing and good diaphragmatic motion Heart:  S1/S2 no murmur,rub, gallop or click PMI normal Abdomen: benighn, BS positve, no tenderness, no AAA no bruit.  No HSM or HJR Distal pulses intact with no bruits No edema Neuro non-focal Skin warm and dry No muscular weakness S/P right mastectomy  Current Outpatient Prescriptions  Medication Sig Dispense Refill  . Ascorbic Acid (VITAMIN C) 1000 MG tablet Take 1,000 mg by mouth daily.        [name] aspirin 81 MG tablet Take 81 mg by mouth daily.        [name] BIOTIN 5000 PO 1 tab po qd       . Calcium 1500 MG tablet Take 1,500 mg by mouth.        [name] ibuprofen (ADVIL,MOTRIN) 200 MG tablet Take 200 mg by mouth every 6 (six) hours as needed.        . Magnesium 500 MG CAPS 1 tab po qd       . Multiple Vitamin (MULTIVITAMIN) capsule 2 tabs po qd       . nitroGLYCERIN (NITROSTAT) 0.3 MG SL tablet Place 0.3 mg under the tongue every 5 (five) minutes as needed.        . Omega-3 Fatty Acids (OMEGA 3 PO) 1 tab po qd         . traMADol (ULTRAM) 50 MG tablet Take 50 mg by mouth. Take 1-2 tablets every 4-6 hours as needed       . VITAMIN K PO 1 tab po qd       . DISCONTD: hyoscyamine (LEVSIN SL) 0.125 MG SL tablet Place 0.125 mg under the tongue. Take 2 Tablets Sublingual Every 4 Hours As Needed       . DISCONTD: omeprazole (PRILOSEC) 20 MG capsule Take 20 mg by mouth 2 (two) times daily before a meal.          Allergies  Penicillins  Electrocardiogram:  03/03/10  NSR 75 nonspecific ST/T wave changes  Assessment and Plan

## 2010-04-28 NOTE — Assessment & Plan Note (Signed)
Diet Rx no history of vascular disease

## 2010-05-06 ENCOUNTER — Other Ambulatory Visit: Payer: Self-pay | Admitting: Oncology

## 2010-05-06 ENCOUNTER — Encounter: Payer: Medicare Other | Admitting: Oncology

## 2010-05-06 LAB — RESEARCH LABS

## 2010-05-07 ENCOUNTER — Other Ambulatory Visit (HOSPITAL_COMMUNITY): Payer: Self-pay | Admitting: Cardiovascular Disease

## 2010-05-07 ENCOUNTER — Ambulatory Visit (HOSPITAL_COMMUNITY): Payer: Medicare Other | Attending: Cardiovascular Disease | Admitting: Radiology

## 2010-05-07 DIAGNOSIS — R0600 Dyspnea, unspecified: Secondary | ICD-10-CM

## 2010-05-07 DIAGNOSIS — R0609 Other forms of dyspnea: Secondary | ICD-10-CM | POA: Insufficient documentation

## 2010-05-07 DIAGNOSIS — R0989 Other specified symptoms and signs involving the circulatory and respiratory systems: Secondary | ICD-10-CM | POA: Insufficient documentation

## 2010-05-13 ENCOUNTER — Encounter (HOSPITAL_COMMUNITY)
Admission: RE | Admit: 2010-05-13 | Discharge: 2010-05-13 | Disposition: A | Discharge status: Home / Self Care | Payer: Medicare Other | Source: Ambulatory Visit | Attending: Oncology | Admitting: Oncology

## 2010-05-13 ENCOUNTER — Encounter (HOSPITAL_COMMUNITY): Payer: Self-pay

## 2010-05-13 DIAGNOSIS — C50919 Malignant neoplasm of unspecified site of unspecified female breast: Secondary | ICD-10-CM

## 2010-05-13 MED ORDER — TECHNETIUM TC 99M MEDRONATE IV KIT: 22.4000 | PACK | Freq: Once | INTRAVENOUS | Status: DC | PRN

## 2010-05-14 ENCOUNTER — Ambulatory Visit (HOSPITAL_COMMUNITY): Payer: Medicare Other

## 2010-05-14 ENCOUNTER — Encounter (HOSPITAL_COMMUNITY): Payer: Medicare Other

## 2010-05-21 NOTE — Discharge Summary (Signed)
  NAMEGRIZEL, VESELY             ACCOUNT NO.:  [id number]  MEDICAL RECORD NO.:  [id number]          PATIENT TYPE:  LOCATION:                                 FACILITY:  PHYSICIAN:  Juanetta Gosling, MDDATE OF BIRTH:  Jun 22, 1940  DATE OF ADMISSION: DATE OF DISCHARGE:                              DISCHARGE SUMMARY   ADMISSION DIAGNOSES: 1. Monoclonal gammopathy of undetermined significance. 2. Newly diagnosed breast cancer.  DISCHARGE DIAGNOSES: 1. Monoclonal gammopathy of undetermined significance. 2. Newly diagnosed breast cancer.  PROCEDURES PERFORMED:  On March 05, 2010, right breast wire-guided lumpectomy, injection of blue dye for sentinel node identification, right axillary sentinel node biopsy.  CONSULTS:  None.  HISTORY AND HOSPITAL COURSE:  Ms. [name] is a 69 year old female who underwent a screening mammogram in 2011 with a right breast abnormality. This was biopsied and noted to be an invasive ductal carcinoma.  We discussed all of her options and we elected to proceed with breast conservation therapy.  She underwent a lumpectomy and sentinel node biopsy on March 05, 2010.  She was maintained overnight and was discharged home the following day doing well without any complaints. Her pathology report showed her to have a stage I breast cancer that is a T1b N0, ER/PR positive with a Ki67 of 6%.  There were two sentinel nodes that both were negative.  Her discharge instructions were to follow up with [facility] 2 weeks after surgery.  Her medications upon discharge were Correctol, biotin, vitamin D, vitamin K, vitamin C, magnesium, aspirin 325 mg, calcium carbonate, omega-3, multivitamins, nitroglycerin as needed, tramadol, as well as Percocet as needed.     Juanetta Gosling, MD     MCW/MEDQ  D:  05/19/2010  T:  05/19/2010  Job:  [id number]  Electronically Signed by Emelia Loron MD on 05/21/2010 07:46:09 AM

## 2010-05-28 ENCOUNTER — Encounter (HOSPITAL_BASED_OUTPATIENT_CLINIC_OR_DEPARTMENT_OTHER): Payer: Medicare Other | Admitting: Oncology

## 2010-05-28 ENCOUNTER — Other Ambulatory Visit: Payer: Self-pay | Admitting: Oncology

## 2010-05-28 DIAGNOSIS — D472 Monoclonal gammopathy: Secondary | ICD-10-CM

## 2010-05-28 DIAGNOSIS — E559 Vitamin D deficiency, unspecified: Secondary | ICD-10-CM

## 2010-05-28 LAB — CBC WITH DIFFERENTIAL/PLATELET
BASO%: 0.3 % (ref 0.0–2.0)
Basophils Absolute: 0 10*3/uL (ref 0.0–0.1)
EOS%: 4.6 % (ref 0.0–7.0)
Eosinophils Absolute: 0.2 10*3/uL (ref 0.0–0.5)
HCT: 38.4 % (ref 34.8–46.6)
HGB: 13.1 g/dL (ref 11.6–15.9)
LYMPH%: 22.9 % (ref 14.0–49.7)
MCH: 33.7 pg (ref 25.1–34.0)
MCHC: 34.1 g/dL (ref 31.5–36.0)
MCV: 98.9 fL (ref 79.5–101.0)
MONO#: 0.3 10*3/uL (ref 0.1–0.9)
MONO%: 5.4 % (ref 0.0–14.0)
NEUT#: 3.5 10*3/uL (ref 1.5–6.5)
NEUT%: 66.8 % (ref 38.4–76.8)
Platelets: 207 10*3/uL (ref 145–400)
RBC: 3.88 10*6/uL (ref 3.70–5.45)
RDW: 13.5 % (ref 11.2–14.5)
WBC: 5.3 10*3/uL (ref 3.9–10.3)
lymph#: 1.2 10*3/uL (ref 0.9–3.3)

## 2010-05-29 LAB — COMPREHENSIVE METABOLIC PANEL
ALT: 22 U/L (ref 0–35)
AST: 31 U/L (ref 0–37)
Albumin: 4.2 g/dL (ref 3.5–5.2)
Alkaline Phosphatase: 69 U/L (ref 39–117)
BUN: 7 mg/dL (ref 6–23)
CO2: 26 mEq/L (ref 19–32)
Calcium: 9 mg/dL (ref 8.4–10.5)
Chloride: 106 mEq/L (ref 96–112)
Creatinine, Ser: 0.65 mg/dL (ref 0.40–1.20)
Glucose, Bld: 92 mg/dL (ref 70–99)
Potassium: 4.2 mEq/L (ref 3.5–5.3)
Sodium: 142 mEq/L (ref 135–145)
Total Bilirubin: 0.6 mg/dL (ref 0.3–1.2)
Total Protein: 7.4 g/dL (ref 6.0–8.3)

## 2010-05-29 LAB — VITAMIN D 25 HYDROXY (VIT D DEFICIENCY, FRACTURES): Vit D, 25-Hydroxy: 29 ng/mL — ABNORMAL LOW (ref 30–89)

## 2010-06-05 ENCOUNTER — Encounter (HOSPITAL_BASED_OUTPATIENT_CLINIC_OR_DEPARTMENT_OTHER): Payer: Medicare Other | Admitting: Oncology

## 2010-06-05 ENCOUNTER — Other Ambulatory Visit: Payer: Self-pay | Admitting: Oncology

## 2010-06-05 DIAGNOSIS — D472 Monoclonal gammopathy: Secondary | ICD-10-CM

## 2010-06-05 DIAGNOSIS — E559 Vitamin D deficiency, unspecified: Secondary | ICD-10-CM

## 2010-06-05 DIAGNOSIS — C50419 Malignant neoplasm of upper-outer quadrant of unspecified female breast: Secondary | ICD-10-CM

## 2010-06-08 ENCOUNTER — Ambulatory Visit: Payer: Medicare Other | Attending: Radiation Oncology | Admitting: Radiation Oncology

## 2010-06-08 ENCOUNTER — Encounter (HOSPITAL_BASED_OUTPATIENT_CLINIC_OR_DEPARTMENT_OTHER): Payer: Medicare Other | Admitting: Oncology

## 2010-06-08 DIAGNOSIS — M810 Age-related osteoporosis without current pathological fracture: Secondary | ICD-10-CM

## 2010-06-08 DIAGNOSIS — C50419 Malignant neoplasm of upper-outer quadrant of unspecified female breast: Secondary | ICD-10-CM

## 2010-06-12 NOTE — Procedures (Signed)
[place]  Patient:    [name], [name]                    MRN: [id number] Proc. Date: 05/20/99 Adm. Date:  [id number] Attending:  Judeth Cornfield CC:         Titus Dubin. Alwyn Ren, M.D. [facility]                           Procedure Report  PROCEDURE:  Upper endoscopy.  ENDOSCOPIST:  Barbette Hair. Arlyce Dice, M.D.  INDICATIONS:  The patient is a 69 year old female complaining of intermittent chest pain, described as a knot-like sensation in her chest when she eats.  This occurs with solid foods, and is quite uncomfortable.  It occurs intermittently and not  with every meal.  This test is performed to rule out intrinsic and extrinsic abnormalities of the esophagus.  INFORMED CONSENT:  The patient provided a consent after the risks, benefits, and alternatives were explained.  MEDICATIONS:  Versed 4 mg, fentanyl 50 mcg IV, and cetacaine spray.  DESCRIPTION OF PROCEDURE:  The patient was placed in the left lateral decubitus  position and administered continuous low-flow oxygen and was placed on pulse oximetry.  The Olympus video gastroscope was inserted under direct vision into he oropharynx and esophagus.  FINDINGS: 1. There were no mucosal abnormalities in the esophagus.  There was questionable    minimal narrowing at the GE junction. 2. Normal stomach and duodenum.  PROCEDURE:  With a scope in the distal stomach, a guide wire was placed through the scope and the scope was withdrawn.  Under fluoroscopic guidance 17.0 mm and 18.0 mm Savary dilators were passed, with minimal resistance.  IMPRESSION: 1. Questionable early esophageal stricturing. 2. History of dysphagia.  RECOMMENDATIONS:  A barium swallow if the symptoms continue, with consideration of obtaining a CT of the chest as well. DD:  05/20/99 TD:  05/20/99 Job: [id number]

## 2010-06-12 NOTE — Cardiovascular Report (Signed)
NAME:  [name], [name]                   ACCOUNT NO.:  [id number]   MEDICAL RECORD NO.:  [id number]                   PATIENT TYPE:  OIB   LOCATION:  6501                                 FACILITY:  [facility]   PHYSICIAN:  Carole Binning, M.D. [facility]         DATE OF BIRTH:  1940/08/10   DATE OF PROCEDURE:  01/29/2003  DATE OF DISCHARGE:                              CARDIAC CATHETERIZATION   PROCEDURE PERFORMED:  Left heart catheterization with coronary angiography  and left ventriculography.   INDICATIONS:  [name] is a 70 year old woman with symptoms of exertional  chest pain.  She underwent a stress Cardiolite scan which was notable for  reproduction of her symptoms.  She had nondiagnostic EKG changes.  Perfusion  images were interpreted as revealing atrial thinning, but no definite  ischemia.  Despite this she has continued to have exertional chest pain on a  daily basis.  We therefore opted to proceed with cardiac catheterization as  a definitive study to rule out coronary artery disease.   PROCEDURAL NOTE:  A 4-French sheath was placed in the right femoral artery.  Coronary angiography was performed with 4-French JL4 and JR4 catheters.  Left ventriculography was performed with an angled pigtail catheter.  Contrast was Omnipaque.  There were no complications.   RESULTS:  HEMODYNAMICS:  Left ventricular pressure 118/10.  Aortic pressure 118/62.  There is no  aortic valve gradient.   LEFT VENTRICULOGRAM:  Wall motion is normal.  Ejection fraction estimated at greater than or equal  to 60%.  There is no mitral regurgitation.   CORONARY ARTERIOGRAPHY:  (Codominant)  Left main is normal.   Left anterior descending artery gives rise to a normal sized diagonal  branch.  The LAD is a relatively large vessel curling the apex.  The LAD is  angiographically normal.   Left circumflex is a large, codominant vessel.  It gives rise to a normal  sized obtuse marginal branch and a  normal sized posterolateral branch.  The  left circumflex is angiographically normal.   Right coronary artery is a relatively small codominant vessel.  The right  coronary artery gives rise to a small to normal sized posterior descending  artery.  The right coronary artery is angiographically normal.   IMPRESSION:  1. Normal left ventricular systolic function.  2. Normal coronary arteries.   PLAN:  The patient will be managed medically.  Her chest pain appears to be  noncardiac in etiology.                                               Carole Binning, M.D. [facility]    MWP/MEDQ  D:  01/29/2003  T:  01/29/2003  Job:  [id number]   cc:   Cath Lab   Titus Dubin. Alwyn Ren, M.D. [facility]

## 2010-06-12 NOTE — Assessment & Plan Note (Signed)
[facility]                                 ON-CALL NOTE   BEA, DUREN                      MRN:          [id number]  DATE:05/01/2006                            DOB:          07-Jun-1940    TELEPHONE NUMBER:  [phone].   PRIMARY CARE PHYSICIAN:  Dr. Alwyn Ren.   SUBJECTIVE:  The patient was seen approximately one week by Dr. Alwyn Ren  and was diagnosed with a sinus infection. She was given an antibiotic  and Mucinex. Several days following, she began having some tongue  swelling and shortness of breath. On Thursday, she was seen in the  emergency room where they did x-rays of her neck and was told that  nothing is closing down and she was given a new antibiotic and  instructed to stop the old antibiotic. She was also given a course of  steroids. She states that her difficulty swallowing and shortness of  breath has continued to worsen and is definitely worse at night. She  feels as though something is stuck in her throat. She is speaking in  complete sentences at this point in time and does not sound as though  she is in respiratory distress.   ASSESSMENT AND PLAN:  Recommended going immediately to the emergency  room. She will go via ambulance if she has more shortness of breath, but  will go there straight away.     Kerby Nora, MD  Electronically Signed    AB/MedQ  DD: 05/01/2006  DT: 05/02/2006  Job #: [id number]

## 2010-06-12 NOTE — Procedures (Signed)
[facility]  Patient:    [name], [name]                MRN: [id number] Proc. Date: 12/02/99 Adm. Date:  [id number] Attending:  Malon Kindle CC:         Malachi Pro. Ambrose Mantle, M.D.                           Procedure Report  INDICATIONS FOR PROCEDURE:  Meckels diverticulum.  PROCEDURE:  Small bowel resection and appendectomy.  SURGEON:  Dr. Orson Slick.  DESCRIPTION OF PROCEDURE:  Dr. Ambrose Mantle asked me to come in because he had found an abnormality of the small intestine and thought it was Meckels diverticulum. I scrubbed in and identified diverticulum about 2 feet from the ileocecal valve and agreed that it was likely a Meckels diverticulum. The patient was symptomatic of chronic abdominal pain and I felt it might have something to do with it even though it did not appear inflamed. I made small holes in the mesentery and bowel proximal and distal to the diverticulum and divided the bowel at those locations with cutting stapler and then divided intervening mesentery between clamps and ligated the vessels with 2-0 silks. I approximated the ends of the bowel and performed a side to side anastomosis at the end using the cutting stapler. Hemostasis was good. I closed the remaining defect with a linear stapler but left a generous anastomosis. I closed the mesenteric defect with 2-0 silk. The bowel looked healthy and hemostasis was good. The appendix was unremarkable but because of the patients chronic pain, it was felt reasonable to take it out as well. I ligated the mesoappendix with 2-0 silk and divided it and then ligated the base of the appendix with #0 Vicryl and amputated the appendix. I cauterized the mucosa with electrocautery. I did a pursestring suture in the cecum with 3-0 silk and inverted the appendiceal stump into the cecum. Dr. Ambrose Mantle then continued the operation. DD:  12/02/99 TD:  12/03/99 Job: [id number]

## 2010-06-12 NOTE — Op Note (Signed)
[facility]  Patient:    [name], [name]                MRN: [id number] Proc. Date: 12/02/99 Adm. Date:  [id number] Attending:  Malon Kindle CC:         Malachi Pro. Ambrose Mantle, M.D.   Operative Report  PREOPERATIVE DIAGNOSIS:  Suprapubic pain, rule out interstitial cystitis.  POSTOPERATIVE DIAGNOSIS:  Suprapubic pain, rule out interstitial cystitis.  PROCEDURE:  Cystoscopy, hydraulic distention of bladder, and cold cup bladder biopsy and fulguration.  SURGEON:  Maretta Bees. Vonita Moss, M.D.  ANESTHESIA:  General.  INDICATIONS:  This 70 year old white female is brought to the operating room by Dr. Ambrose Mantle for oophorectomy.  I had seen her preoperatively for evaluation of suprapubic and lower abdominal pain since for about 6-8 months now.  She says it hurts around-the-clock; it does not vary much.  She said it hurts a little bit more when her bladder gets full, but she has no real frequency and only nocturia x 0-1.  She is brought to the OR today for Dr. Andreas Newport surgery and for me to evaluate her bladder.  DESCRIPTION OF PROCEDURE:  The patient is brought to the operating room and placed in lithotomy position, and she was cystoscoped, and the bladder was totally normal.  There were no stones, tumors, or inflammatory agents.  She was filled to 1000 cc and looking back in, she had widespread submucosal petechiae and hemorrhage in all four quadrants of the bladder.  I took bladder biopsies from the base in three areas that were representative hemorrhagic areas.  The biopsy sites were fulgurated with the Bugbee electrode.  The scope was removed and a Foley catheter was inserted and connected to closed drainage.  Dr. Ambrose Mantle then proceeded with his oophorectomy. DD:  12/02/99 TD:  12/02/99 Job: [id number]

## 2010-06-12 NOTE — H&P (Signed)
[facility]  Patient:    [name], [name]                MRN: [id number] Adm. Date:  [id number] Attending:  Malon Kindle                         History and Physical  PRESENT ILLNESS:  [age]-year-old white married female, para 2-0-1-2, admitted to the hospital because of persistent left adnexal mass and persistent lower abdominal pain of uncertain etiology, although probably not related to the ovarian mass.  This patient had an acute intestinal illness in February of 2001, at which time she had nausea, vomiting, diarrhea and blood in her stool.  She had a CT scan and then an ultrasound that showed a cyst in the left ovary.  The entire ovarian mass measured about 4 cm.  Since then, she has had repeat ultrasounds on at least three or four occasions, all showing a similar-size cyst in the left ovary that appears to be a simple cyst.  The patient has continued to have lower abdominal pain.  She has seen Dr. Barbette Hair. Arlyce Dice, who has done colonoscopy and upper endoscopy. Apparently, a colonoscopy at the time of the illness showed a healing proctosigmoiditis but now apparently the bowel study is negative.  The patient took Levbid for a while and did not see any improvement.  CA125 has been normal but because of persistent mass and persistent lower abdominal pain, she was advised to see Dr. Maretta Bees. Vonita Moss, who thought it was possible that she might have interstitial cystitis and he will do a cystoscopy under anesthesia at the time of the surgery.  The patient is admitted now for laparotomy and removal of both tubes and ovaries as well as cystoscopy.  She does not have dyspareunia.  ALLERGIES:  No known drug allergies.  MEDICATIONS:  Lopressor; she also takes a medication for dizziness and one for swelling.  OPERATIONS:  Vaginal hysterectomy in 1978, tubal ligation in 1972, TMJ surgery and hemorrhoidectomy.  ILLNESSES:  The patient  has been in quite good health most of her life.  She has no known heart problems except for mitral valve prolapse.  SOCIAL HISTORY:  Alcohol:  None.  Tobacco:  None.  REVIEW OF SYSTEMS:  The patient does have some dizziness; she also hears a sound in her right ear but has been seen by an ear specialist, Dr. Kristine Garbe. Newman, and no abnormality was found.  FAMILY HISTORY:  Mother died at approximately 24 of heart disease and diabetes.  Father died about 35 of hepatitis.  Three sisters, one with diabetes, and four brothers, one with diabetes.  PHYSICAL EXAMINATION  GENERAL:  Physical examination reveals a well-developed, well-nourished white female weighing 131 pounds.  VITAL SIGNS:  Blood pressure 144/84, pulse of 80.  HEENT:  No cranial abnormalities.  Extraocular movements are intact.  The right eye turns inward.  The pharynx is clear.  NECK:  Supple without thyromegaly.  HEART:  Normal size and sounds.  No murmurs.  LUNGS:  Clear to P&A.  BREASTS:  Soft, without masses.  ABDOMEN:  Soft, flat and nontender.  PELVIC:  The vulva and vagina are clear.  The cuff is clean.  The right adnexa is clear.  There is a 3- to 4-cm cystic mass in the left adnexa.  RECTAL:  Exam has been negative.  ADMITTING IMPRESSION:  Persistent left adnexal mass in a postmenopausal female that  appears to be a simple cyst on ultrasound; however, the patient continues to have lower abdominal pain.  She has been evaluated from a gastrointestinal point of view and there have been no significant gastrointestinal findings recently and she has failed to respond to antispasmodics.  She is to undergo cystoscopy with hydrodistention of the bladder, laparotomy, bilateral salpingo-oophorectomy.  She understands that this surgery may well not have any significant impact on her pain, that the surgery is being done to remove the persistent enlarged left ovary, in spite of the fact that it has a low chance  of malignancy.  She understands there are risks of surgery including, but not limited to, heart attack, stroke, pulmonary embolus, wound disruption, hemorrhage with need for reoperation and/or transfusion, fistula formation, nerve injury, intestinal obstruction; she understands and agrees to proceed. DD:  12/01/99 TD:  12/02/99 Job: [id number]

## 2010-06-12 NOTE — Discharge Summary (Signed)
[facility]  Patient:    [name], [name]                MRN: [id number] Adm. Date:  [id number] Disc. Date: 12/06/99 Attending:  Malon Kindle                           Discharge Summary  DISCHARGE DIAGNOSES: 1. Persistent left ovarian cyst. 2. Abdominal pain. 3. Meckels diverticulum discovered at the time of surgery. 4. Status post bilateral salpingo-oophorectomy. 5. Status post small bowel resection. 6. Status post appendectomy. 7. Status post hydrodistention of the bladder with cystoscopy and bladder    biopsies.  DISCHARGE MEDICATIONS: 1. Motrin 600 mg p.o. every 6 hours p.r.n. 2. Percocet 1-2 tablets p.o. every 4 hours p.r.n.  DISCHARGE FOLLOW-UP:  The patient is to follow up in our office in two weeks for an incision check and with the general surgeons as instructed by them.  HISTORY:  The patient is a 70 year old married female, G3, P2-0-1-2, who is admitted because of persistent left adnexal mass and persistent lower abdominal pain.  The patient had undergone work-up prior to surgery for her abdominal pain from gastroenterologist which was negative as well as by urology to rule out interstitial cystitis.  At the time of laparotomy, the plan was to perform diagnostic cystoscopy as well.  The patient has no known drug allergies.  MEDICATIONS:  Lopressor only.  PAST SURGICAL HISTORY: 1. Vaginal hysterectomy in 1978. 2. Tubal ligation in 1972. 3. TMJ surgery and hemorrhoidectomy.  PAST MEDICAL HISTORY:  Mitral valve prolapse.  SOCIAL HISTORY:  Nonsignificant.  HOSPITAL COURSE:  On admission, blood pressure was normal at 144/84, and she then underwent an exploratory laparotomy bilateral salpingo-oophorectomy for persistent left ovarian cyst which appeared simple in nature at the time of surgery.  Urology also evaluated the patient with cystoscopy and bladder biopsies with hydrodistention for possible treatment of  interstitial cystitis. Incidentally, at the time of surgery, a Meckels diverticulum was discovered. Therefore, general surgery consult was obtained intraoperatively with Dr. Cathey Endow who performed small bowel resection to remove the Meckels diverticulum as well as an appendectomy.  The patient was then admitted for postoperative care and did very well.  She remained afebrile except for some low-grade temperatures to 100 and healed well.  On postoperative day #4, the patient was tolerating a full liquid diet and passing flatus with no nausea and vomiting. She was afebrile with stable vital signs.  Her abdomen was soft and nontender. Her incision was well approximated with Steri-Strips.  Therefore, as cleared by surgery, the patient was discharged home to follow up in two weeks in the office with Dr. Ambrose Mantle and was to remain on a soft diet until further instruction by general surgery. DD:  12/06/99 TD:  12/06/99 Job: [id number]

## 2010-06-12 NOTE — Op Note (Signed)
[facility]  Patient:    [name], [name]                MRN: [id number] Proc. Date: 12/02/99 Adm. Date:  [id number] Attending:  Malon Kindle CC:         Barbette Hair. Arlyce Dice, M.D. [facility]  Zigmund Daniel, M.D.  Maretta Bees. Vonita Moss, M.D.   Operative Report  PREOPERATIVE DIAGNOSES: 1. Persistent abdominal pain. 2. Persistent left ovarian cyst. 3. Possible interstitial cystitis.  POSTOPERATIVE DIAGNOSES: 1. Persistent abdominal pain. 2. Persistent left ovarian cyst. 3. Possible interstitial cystitis. 4. Meckels diverticulum.  OPERATION:  Cystoscopy with hydraulic distention of the bladder, cold cut bladder biopsies by Dr. Vonita Moss, bilateral salpingo-oophorectomy by Dr. Ambrose Mantle, removal of Meckels diverticulum by small bowel resection and appendectomy by Dr. Orson Slick.  SURGEON:  Malachi Pro. Ambrose Mantle, M.D., Maretta Bees. Vonita Moss, M.D., and Zigmund Daniel, M.D.  ASSISTANT:  Alvino Chapel, M.D.  ANESTHESIA:  General.  DESCRIPTION OF PROCEDURE:  The patient was brought to the operating room, placed under satisfactory general anesthesia, and Dr. Vonita Moss did his cystoscopy, hydraulic distention of the bladder, and cold cut bladder biopsies which he dictated separately.  The patient was then placed supine with a Foley catheter in place.  The abdomen was prepped with Betadine solution and draped as a sterile field.  A transverse incision was made and carried in layers through the skin and subcutaneous tissue and fascia.  The fascia was separated from the rectus muscle superiorly and inferiorly.  The rectus muscle was split in the midline, and peritoneum was opened vertically.  I could not get a real good feel of the upper abdomen because of the small incision, but the liver edge felt smooth. both kidneys felt normal.  I did not feel the gallbladder.  Exploration of the pelvis revealed a 4 to 4.5 cm left ovarian cyst that was not  adherent.  The right ovary was small.  Both tubes had been previously ligated.  The rest of the pelvis looked normal.  On entering the abdomen, I did notice a Meckels diverticulum.  I did pelvic washings and saved them or analysis.  I then proceeded to prepare the operative field, and I asked Dr. Orson Slick to come to the operating room.  I went ahead and did a bilateral salpingo-oophorectomy, doubly suture ligating both infundibular pelvic ligaments, clamping, cutting, and suture ligating the remainder of the mesosalpinx and mesovarium bilaterally and doubly suture ligated both of these clamps.  The left ovary was sent for frozen section and was reported as benign.  The washings were discarded.  Dr. Orson Slick entered the operating room, confirmed the Meckels diverticulum advised removal.   He scrubbed in and did a small-bowel resection which he will dictate separately as well as an appendectomy.  After he had confirmed hemostasis, I reconfirmed hemostasis in the pelvis, removed all packs and retractors, closed the abdominal wall using running suture of 0 Vicryl in the peritoneum, interrupted 0 Vicryl in the rectus muscle, running sutures of 0 Vicryl in the rectus fascia, running 3-0 Vicryl in the subcutaneous tissue, and staples on the skin.  The patient seemed to tolerate the procedure well.  Blood loss was no more than 50 cc.  Sponge and needle counts were correct.  The urine was bloody from the time Dr. Vonita Moss did his bladder biopsies, and it would continue to be slightly pink at the end of the procedure.  The patient was returned to recovery in satisfactory condition.  DD:  12/02/99 TD:  12/02/99 Job: [id number]

## 2010-06-12 NOTE — Letter (Signed)
May 11, 2006    Lajuana Matte, MD  [phone] [address]  [place], [place] [address]   RE:  [name], [name]  MRN:  [id number]  /  DOB:  [birth year]   Dear Dr. Arbutus Ped:   Thank you for the detailed reports and assessments you have sent me  concerning [name].   She was seen May 11, 2006 for a physical exam.  In reviewing the  history it became obvious extensive lab testing would be duplicative as  she is scheduled to have labs performed by you in May.   Of concern is history of possible reaction to Augmentin, manifested as  subjective airway compromise, for which she was seen in the ER April  6,2008  She had been seen on April 22, 2006 and placed on Augmentin for  URI.  She had no history of drug allergies prior to this event.   She does have some seasonal rhinoconjunctivitis.  She has no history of  extensive asthma, and she has never smoked.   She was treated aggressively with 10-day course of steroids but  continued to have symptoms of paroxymal nocturnal dyspnea until  approximately April 13.   At this time she continues to have some sore throat.  She is on no  medication.   Her weight is stable at 124; she was afebrile at 97.8, pulse 78 and  regular, respiratory rate 18, blood pressure 124/72.  Nares and otic  canals were patent.  There was no nasal purulence.  There was mild  erythema of the oropharynx.  She had no lymphadenopathy about the head,  neck or axilla. Thyroid is asymmetric without nodules.  She has full  extraocular motion and no evidence of conjunctivitis.  Her chest is  clear with no wheezing or airway compromise.   An S4 versus a click is noted without significant murmur.   All pulses are intact.  She has no edema.  Homan's sign was negative.   She can lie supine without respiratory compromise.  At this time she has  no neck vein distention.   EKG revealed a short PR but no ischemic change.   I reviewed the labs you had scheduled & will only check  lipids and TSH.  These were not part of the panel you have ordered.   I will prescribe Magic Mouthwash for the sore throat which I feel is  subclinical candida related to 10 days of steroids.  She should wear  MedicAlert bracelet or necklace stating that she is allergic to  Augmentin because of the angioedema type picture she had.  She will be  encouraged to keep Benadryl on hand.  Should she have any subsequent  recurrences consideration will be given to an Epipen.    Sincerely,      Titus Dubin. Alwyn Ren, MD,FACP,FCCP  Electronically Signed    [facility]/Med[facility]  DD: 05/11/2006  DT: 05/11/2006  Job #: [id number]

## 2010-06-25 ENCOUNTER — Encounter: Payer: Self-pay | Admitting: Internal Medicine

## 2010-06-29 ENCOUNTER — Encounter (INDEPENDENT_AMBULATORY_CARE_PROVIDER_SITE_OTHER): Payer: Self-pay | Admitting: General Surgery

## 2010-07-13 ENCOUNTER — Ambulatory Visit
Admission: RE | Admit: 2010-07-13 | Discharge: 2010-07-13 | Disposition: A | Discharge status: Home / Self Care | Payer: Medicare Other | Source: Ambulatory Visit | Attending: Radiation Oncology | Admitting: Radiation Oncology

## 2010-09-05 ENCOUNTER — Other Ambulatory Visit: Payer: Self-pay | Admitting: Internal Medicine

## 2010-09-11 ENCOUNTER — Other Ambulatory Visit: Payer: Self-pay | Admitting: Internal Medicine

## 2010-09-11 NOTE — Telephone Encounter (Signed)
Last seen 1 year ago, patient needs to schedule CPX

## 2010-10-21 ENCOUNTER — Encounter (HOSPITAL_BASED_OUTPATIENT_CLINIC_OR_DEPARTMENT_OTHER): Payer: Medicare Other | Admitting: Oncology

## 2010-10-21 ENCOUNTER — Other Ambulatory Visit: Payer: Self-pay | Admitting: Internal Medicine

## 2010-10-21 DIAGNOSIS — D472 Monoclonal gammopathy: Secondary | ICD-10-CM

## 2010-10-21 LAB — CBC WITH DIFFERENTIAL/PLATELET
BASO%: 0.5 % (ref 0.0–2.0)
Basophils Absolute: 0 10*3/uL (ref 0.0–0.1)
EOS%: 4.9 % (ref 0.0–7.0)
Eosinophils Absolute: 0.3 10*3/uL (ref 0.0–0.5)
HCT: 35.7 % (ref 34.8–46.6)
HGB: 12.2 g/dL (ref 11.6–15.9)
LYMPH%: 30.5 % (ref 14.0–49.7)
MCH: 33.5 pg (ref 25.1–34.0)
MCHC: 34.3 g/dL (ref 31.5–36.0)
MCV: 97.8 fL (ref 79.5–101.0)
MONO#: 0.5 10*3/uL (ref 0.1–0.9)
MONO%: 6.7 % (ref 0.0–14.0)
NEUT#: 3.9 10*3/uL (ref 1.5–6.5)
NEUT%: 57.4 % (ref 38.4–76.8)
Platelets: 200 10*3/uL (ref 145–400)
RBC: 3.65 10*6/uL — ABNORMAL LOW (ref 3.70–5.45)
RDW: 14.7 % — ABNORMAL HIGH (ref 11.2–14.5)
WBC: 6.7 10*3/uL (ref 3.9–10.3)
lymph#: 2.1 10*3/uL (ref 0.9–3.3)

## 2010-10-22 LAB — KAPPA/LAMBDA LIGHT CHAINS
Kappa free light chain: 8.78 mg/dL — ABNORMAL HIGH (ref 0.33–1.94)
Kappa:Lambda Ratio: 9.24 — ABNORMAL HIGH (ref 0.26–1.65)
Lambda Free Lght Chn: 0.95 mg/dL (ref 0.57–2.63)

## 2010-10-22 LAB — COMPREHENSIVE METABOLIC PANEL WITH GFR
ALT: 15 U/L (ref 0–35)
AST: 25 U/L (ref 0–37)
Albumin: 3.9 g/dL (ref 3.5–5.2)
Alkaline Phosphatase: 39 U/L (ref 39–117)
BUN: 8 mg/dL (ref 6–23)
CO2: 26 meq/L (ref 19–32)
Calcium: 8.5 mg/dL (ref 8.4–10.5)
Chloride: 101 meq/L (ref 96–112)
Creatinine, Ser: 0.82 mg/dL (ref 0.50–1.10)
Glucose, Bld: 88 mg/dL (ref 70–99)
Potassium: 4.5 meq/L (ref 3.5–5.3)
Sodium: 137 meq/L (ref 135–145)
Total Bilirubin: 0.3 mg/dL (ref 0.3–1.2)
Total Protein: 6.4 g/dL (ref 6.0–8.3)

## 2010-10-22 LAB — IGG, IGA, IGM
IgA: 39 mg/dL — ABNORMAL LOW (ref 69–380)
IgG (Immunoglobin G), Serum: 1570 mg/dL (ref 690–1700)
IgM, Serum: 32 mg/dL — ABNORMAL LOW (ref 52–322)

## 2010-10-22 LAB — BETA 2 MICROGLOBULIN, SERUM: Beta-2 Microglobulin: 1.34 mg/L (ref 1.01–1.73)

## 2010-10-22 LAB — LACTATE DEHYDROGENASE: LDH: 137 U/L (ref 94–250)

## 2010-10-28 ENCOUNTER — Encounter (HOSPITAL_BASED_OUTPATIENT_CLINIC_OR_DEPARTMENT_OTHER): Payer: Medicare Other | Admitting: Oncology

## 2010-10-28 DIAGNOSIS — M810 Age-related osteoporosis without current pathological fracture: Secondary | ICD-10-CM

## 2010-10-28 DIAGNOSIS — E559 Vitamin D deficiency, unspecified: Secondary | ICD-10-CM

## 2010-10-28 DIAGNOSIS — D472 Monoclonal gammopathy: Secondary | ICD-10-CM

## 2010-10-28 DIAGNOSIS — C50419 Malignant neoplasm of upper-outer quadrant of unspecified female breast: Secondary | ICD-10-CM

## 2010-11-02 ENCOUNTER — Other Ambulatory Visit: Payer: Self-pay | Admitting: Oncology

## 2010-11-02 ENCOUNTER — Ambulatory Visit
Admission: RE | Admit: 2010-11-02 | Discharge: 2010-11-02 | Disposition: A | Discharge status: Home / Self Care | Payer: Medicare Other | Source: Ambulatory Visit | Attending: Radiation Oncology | Admitting: Radiation Oncology

## 2010-11-04 LAB — UIFE/LIGHT CHAINS/TP QN, 24-HR UR
Albumin, U: DETECTED
Alpha 1, Urine: DETECTED — AB
Alpha 2, Urine: DETECTED — AB
Beta, Urine: DETECTED — AB
Free Kappa Lt Chains,Ur: 3.68 mg/dL — ABNORMAL HIGH (ref 0.14–2.42)
Free Kappa/Lambda Ratio: 92 ratio — ABNORMAL HIGH (ref 2.04–10.37)
Free Lambda Excretion/Day: 0.8 mg/d
Free Lambda Lt Chains,Ur: 0.04 mg/dL (ref 0.02–0.67)
Free Lt Chn Excr Rate: 73.6 mg/d
Gamma Globulin, Urine: DETECTED — AB
Time: 24 hours
Total Protein, Urine-Ur/day: 84 mg/d (ref 10–140)
Total Protein, Urine: 4.2 mg/dL
Volume, Urine: 2000 mL

## 2010-11-04 LAB — IMMUNOFIXATION ELECTROPHORESIS
IgA: 43 mg/dL — ABNORMAL LOW (ref 69–380)
IgG (Immunoglobin G), Serum: 1600 mg/dL (ref 690–1700)
IgM, Serum: 36 mg/dL — ABNORMAL LOW (ref 52–322)
Total Protein, Serum Electrophoresis: 7.2 g/dL (ref 6.0–8.3)

## 2010-11-06 ENCOUNTER — Other Ambulatory Visit (HOSPITAL_COMMUNITY)
Admission: RE | Admit: 2010-11-06 | Discharge: 2010-11-06 | Disposition: A | Discharge status: Home / Self Care | Payer: Medicare Other | Source: Ambulatory Visit | Attending: Oncology | Admitting: Oncology

## 2010-11-06 ENCOUNTER — Encounter (HOSPITAL_BASED_OUTPATIENT_CLINIC_OR_DEPARTMENT_OTHER): Payer: Medicare Other | Admitting: Oncology

## 2010-11-06 ENCOUNTER — Other Ambulatory Visit: Payer: Self-pay | Admitting: Oncology

## 2010-11-06 DIAGNOSIS — D472 Monoclonal gammopathy: Secondary | ICD-10-CM | POA: Insufficient documentation

## 2010-11-06 DIAGNOSIS — C50419 Malignant neoplasm of upper-outer quadrant of unspecified female breast: Secondary | ICD-10-CM

## 2010-11-06 HISTORY — PX: BONE BIOPSY: SHX375

## 2010-11-06 LAB — CBC
HCT: 40.9 % (ref 36.0–46.0)
Hemoglobin: 13.7 g/dL (ref 12.0–15.0)
MCH: 33.1 pg (ref 26.0–34.0)
MCHC: 33.5 g/dL (ref 30.0–36.0)
MCV: 98.8 fL (ref 78.0–100.0)
Platelets: 264 10*3/uL (ref 150–400)
RBC: 4.14 MIL/uL (ref 3.87–5.11)
RDW: 14 % (ref 11.5–15.5)
WBC: 5.5 10*3/uL (ref 4.0–10.5)

## 2010-11-06 LAB — DIFFERENTIAL
Basophils Absolute: 0 10*3/uL (ref 0.0–0.1)
Basophils Relative: 1 % (ref 0–1)
Eosinophils Absolute: 0.3 10*3/uL (ref 0.0–0.7)
Eosinophils Relative: 5 % (ref 0–5)
Lymphocytes Relative: 40 % (ref 12–46)
Lymphs Abs: 2.2 10*3/uL (ref 0.7–4.0)
Monocytes Absolute: 0.4 10*3/uL (ref 0.1–1.0)
Monocytes Relative: 7 % (ref 3–12)
Neutro Abs: 2.6 10*3/uL (ref 1.7–7.7)
Neutrophils Relative %: 47 % (ref 43–77)

## 2010-11-19 ENCOUNTER — Encounter: Payer: Self-pay | Admitting: *Deleted

## 2010-11-19 DIAGNOSIS — C50919 Malignant neoplasm of unspecified site of unspecified female breast: Secondary | ICD-10-CM

## 2010-11-24 ENCOUNTER — Ambulatory Visit (INDEPENDENT_AMBULATORY_CARE_PROVIDER_SITE_OTHER): Payer: Medicare Other | Admitting: General Surgery

## 2010-11-24 ENCOUNTER — Encounter (INDEPENDENT_AMBULATORY_CARE_PROVIDER_SITE_OTHER): Payer: Self-pay | Admitting: General Surgery

## 2010-11-24 VITALS — BP 110/64 | HR 72 | Temp 97.5°F | Resp 16 | Ht 63.0 in | Wt 132.0 lb

## 2010-11-24 DIAGNOSIS — Z853 Personal history of malignant neoplasm of breast: Secondary | ICD-10-CM

## 2010-11-24 NOTE — Progress Notes (Signed)
Subjective:     Patient ID: [name], female   DOB: 1940/02/01, 70 y.o.   MRN: [id number]  HPI This is a 70 year old female who was treated with breast conservation therapy for a stage I right breast cancer in 2012. Her pathology was an invasive ductal carcinoma that was hormone receptor positive. 2 sentinel nodes were negative. Following surgery she underwent radiation therapy and is now on tamoxifen which she is tolerating well. She had a small fluid collection in her axilla then underwent ultrasound and made it just showed a seroma. She is otherwise without any real complaints and has no evidence of any breast masses, tenderness, or discharge. She comes in today just for a check. She has had apparently a bone aspirate also.  She does have some right arm soreness and tenderness in her axilla.  Review of Systems  HENT: Positive for sore throat.   All other systems reviewed and are negative.       Objective:   Physical Exam  Constitutional: She appears well-developed and well-nourished.  Neck: Neck supple.  Pulmonary/Chest: Right breast exhibits no inverted nipple, no mass, no nipple discharge, no skin change and no tenderness. Left breast exhibits no inverted nipple, no mass, no nipple discharge, no skin change and no tenderness. Breasts are symmetrical.    Lymphadenopathy:    She has no cervical adenopathy.    She has no axillary adenopathy.       Right: No supraclavicular adenopathy present.       Left: No supraclavicular adenopathy present.       Assessment:     History stage I right breast cancer s/p lumpectomy, sentinel node biopsy, xrt now on tamoxifen    Plan:     She is doing well from the standpoint of her breast cancer. She is going to continue her only exams every month. I will plan on seeing her back anteriorly for an exam. She is due to follow up with Dr. Donnie Coffin in December. She is also due to get a mammogram in December which we will followup also. She has  some soreness in her axilla and I recommend physical therapy to her and I gave her prescription to go to the after breast cancer physical therapy class.

## 2010-11-24 NOTE — Patient Instructions (Signed)
Breast Problems and Self-Exam Completing monthly breast exams may pick up problems early and save lives. There can be numerous causes of swelling, tenderness or lumps in the breasts. Some of these causes are:   Fibrocystic breast syndrome (noncancerous lumps). This is the most common cause of lumps in the breast.   Fibroadenoma breast tumors of unknown cause. These are noncancerous (benign) lumps.   Benign fatty tumors (lipomas).   Cancer of the breast.  By doing monthly breast exams, you get to know how your breasts feel and how they can change from month to month. This allows you to notice changes early. It can also offer you some reassurance that your breast health is good.  BREAST SELF-EXAM There are a few points to follow when doing a thorough breast exam. The best time to examine your breasts is 5 to 7 days after your menstrual period is over. During menstruation, the breasts are lumpier, and it may be more difficult to pick up changes. If you do not menstruate, have reached menopause, or had a hysterectomy (uterus removal), examine your breasts the first day of every month. After 3 to 4 months, you will become more familiar with the variations of your breasts and more comfortable with the exam.  Perform your breast exam monthly. Keep a written record with breast changes or normal findings for each breast. This makes it easier to be sure of changes, so you do not need to depend only on memory for size, tenderness, or location. Try to do the exam at the same time each month, and write down where you are in your menstrual cycle, if you are still menstruating.   Look at your breasts. Stand in front of a mirror with your hands clasped behind your head. Tighten your chest muscles and look for asymmetry. This means a difference in shape or contour from one breast to the other, such as puckers, dips or bumps. Also, look for skin changes.   Lean forward with your hands on your hips. Again, look for  symmetry and skin changes.   While showering, soap the breasts. Then, carefully feel the breasts with your fingertips, while holding the other arm (on the side of the breast you are examining) over your head. Do this with each breast, carefully feeling for lumps or changes. Typically, a circular motion with moderate fingertip pressure should be used.   Repeat this exam while lying on your back. Put your arm behind your head and a pillow under your shoulders. Again, use your fingertips to examine both breasts, feeling for lumps and thickening. Begin at the top of your breast, and go clockwise around the whole breast.   At the end of your exam, gently squeeze each nipple to see if there is any drainage of fluids. Look for nipple changes, dimpling, or redness.   Lastly, examine the upper chest and collarbone (clavicle) areas, and in your armpits.  It is not necessary to be alarmed if you find a breast lump. Most of them are not cancerous. However, it is necessary to see your caregiver if a lump is found, in order to have it looked at. Document Released: 01/11/2005 Document Revised: 09/23/2010 Document Reviewed: 04/30/2008 [employer] Patient Information 2012 [employer]. 

## 2010-12-09 ENCOUNTER — Ambulatory Visit (HOSPITAL_BASED_OUTPATIENT_CLINIC_OR_DEPARTMENT_OTHER): Payer: Medicare Other

## 2010-12-09 ENCOUNTER — Other Ambulatory Visit: Payer: Self-pay | Admitting: Oncology

## 2010-12-09 VITALS — BP 105/68 | HR 77 | Temp 97.7°F

## 2010-12-09 DIAGNOSIS — M810 Age-related osteoporosis without current pathological fracture: Secondary | ICD-10-CM

## 2010-12-09 DIAGNOSIS — C50419 Malignant neoplasm of upper-outer quadrant of unspecified female breast: Secondary | ICD-10-CM

## 2010-12-09 DIAGNOSIS — C50919 Malignant neoplasm of unspecified site of unspecified female breast: Secondary | ICD-10-CM

## 2010-12-09 MED ORDER — DENOSUMAB 60 MG/ML ~~LOC~~ SOLN
60.0000 mg | Freq: Once | SUBCUTANEOUS | Status: AC
  Administered 2010-12-09: 60 mg via SUBCUTANEOUS
  Filled 2010-12-09: qty 1

## 2010-12-23 ENCOUNTER — Ambulatory Visit (HOSPITAL_BASED_OUTPATIENT_CLINIC_OR_DEPARTMENT_OTHER): Payer: Medicare Other | Admitting: Oncology

## 2010-12-23 ENCOUNTER — Telehealth: Payer: Self-pay | Admitting: Oncology

## 2010-12-23 VITALS — BP 116/69 | HR 84 | Temp 97.8°F | Ht 65.0 in | Wt 130.3 lb

## 2010-12-23 DIAGNOSIS — E559 Vitamin D deficiency, unspecified: Secondary | ICD-10-CM

## 2010-12-23 DIAGNOSIS — D472 Monoclonal gammopathy: Secondary | ICD-10-CM

## 2010-12-23 DIAGNOSIS — C50919 Malignant neoplasm of unspecified site of unspecified female breast: Secondary | ICD-10-CM

## 2010-12-23 DIAGNOSIS — C50419 Malignant neoplasm of upper-outer quadrant of unspecified female breast: Secondary | ICD-10-CM

## 2010-12-23 DIAGNOSIS — Z1231 Encounter for screening mammogram for malignant neoplasm of breast: Secondary | ICD-10-CM

## 2010-12-23 DIAGNOSIS — M810 Age-related osteoporosis without current pathological fracture: Secondary | ICD-10-CM

## 2010-12-23 NOTE — Progress Notes (Signed)
Hematology and Oncology Follow Up Visit  Anokhi Shannon [id number] 02-24-1940 70 y.o. 12/23/2010 7:09 PM   Principle Diagnosis: Pleasant 70 year old woman history of node-negative breast cancer status post completion of radiation 05/06/2010 currently on tamoxifen as well as prolia. History of monoclonal gammopathy of unknown significance on observation  Interim History:  Shakeela is doing well. She is tolerating tamoxifen without any significant side effects. We did do a followup bone marrow biopsy on her a few months ago which showed 8% plasma cells. This was not significantly different than it has been on this checked about 5 years ago. He is otherwise doing well. She has a followup mammogram next spring  Medications: I have reviewed the patient's current medications.  Allergies:  Allergies  Allergen Reactions  . Penicillins Shortness Of Breath and Itching    Past Medical History, Surgical history, Social history, and Family History were reviewed and updated.  Review of Systems: Constitutional:  Negative for fever, chills, night sweats, anorexia, weight loss, pain. Cardiovascular: no chest pain or dyspnea on exertion Respiratory: no cough, shortness of breath, or wheezing Neurological: no TIA or stroke symptoms Dermatological: negative ENT: negative Skin Gastrointestinal: no abdominal pain, change in bowel habits, or black or bloody stools Genito-Urinary: no dysuria, trouble voiding, or hematuria Hematological and Lymphatic: negative Breast: negative for breast lumps Musculoskeletal: negative Remaining ROS negative.  Physical Exam: Blood pressure 116/69, pulse 84, temperature 97.8 F (36.6 C), height 5\' 5"  (1.651 m), weight 130 lb 4.8 oz (59.104 kg). ECOG: 0 General appearance: alert, cooperative and appears stated age Head: Normocephalic, without obvious abnormality, atraumatic Neck: no adenopathy, no carotid bruit, no JVD, supple, symmetrical, trachea midline and  thyroid not enlarged, symmetric, no tenderness/mass/nodules Lymph nodes: Cervical, supraclavicular, and axillary nodes normal. Cardiac : Normal heart sounds Pulmonary: Normal breath sounds Breasts: Right and left breasts are free of any masses, no nipple retraction or skin changes Abdomen: Soft nontender, no organomegaly or masses Extremities normal Neuro: Normal  Lab Results: Lab Results  Component Value Date   WBC 5.5 11/06/2010   HGB 13.7 11/06/2010   HCT 40.9 11/06/2010   MCV 98.8 11/06/2010   PLT 264 11/06/2010     Chemistry      Component Value Date/Time   NA 137 10/21/2010 1308   NA 137 10/21/2010 1308   NA 137 10/21/2010 1308   NA 137 10/21/2010 1308   K 4.5 10/21/2010 1308   K 4.5 10/21/2010 1308   K 4.5 10/21/2010 1308   K 4.5 10/21/2010 1308   CL 101 10/21/2010 1308   CL 101 10/21/2010 1308   CL 101 10/21/2010 1308   CL 101 10/21/2010 1308   CO2 26 10/21/2010 1308   CO2 26 10/21/2010 1308   CO2 26 10/21/2010 1308   CO2 26 10/21/2010 1308   BUN 8 10/21/2010 1308   BUN 8 10/21/2010 1308   BUN 8 10/21/2010 1308   BUN 8 10/21/2010 1308   CREATININE 0.82 10/21/2010 1308   CREATININE 0.82 10/21/2010 1308   CREATININE 0.82 10/21/2010 1308   CREATININE 0.82 10/21/2010 1308      Component Value Date/Time   CALCIUM 8.5 10/21/2010 1308   CALCIUM 8.5 10/21/2010 1308   CALCIUM 8.5 10/21/2010 1308   CALCIUM 8.5 10/21/2010 1308   ALKPHOS 39 10/21/2010 1308   ALKPHOS 39 10/21/2010 1308   ALKPHOS 39 10/21/2010 1308   ALKPHOS 39 10/21/2010 1308   AST 25 10/21/2010 1308   AST 25 10/21/2010 1308   AST  25 10/21/2010 1308   AST 25 10/21/2010 1308   ALT 15 10/21/2010 1308   ALT 15 10/21/2010 1308   ALT 15 10/21/2010 1308   ALT 15 10/21/2010 1308   BILITOT 0.3 10/21/2010 1308   BILITOT 0.3 10/21/2010 1308   BILITOT 0.3 10/21/2010 1308   BILITOT 0.3 10/21/2010 1308       Radiological Studies: chest X-ray n/a Mammogram pening Bone density n/a  Impression and Plan: Doesn't post menopausal woman  with early stage breast cancer status post lumpectomy, radiation and on tamoxifen secondary to having fairly severe osteoporosis. This is being managed concurrently with prolia. I will see her again in followup in 6 months time.  More than 50% of the visit was spent in patient-related counselling   Pierce Crane, MD 11/28/20127:09 PM

## 2010-12-23 NOTE — Telephone Encounter (Signed)
scheduled pt for mammogram on 01/20/2011 @ [facility]. Gv pt appt for [id number]

## 2010-12-24 NOTE — Progress Notes (Signed)
CC:   Megan Gosling, MD Titus Dubin. Alwyn Ren, MD,FACP,FCCP Billie Lade, Ph.D., M.D.  PROBLEMS: 1. History of monoclonal gammopathy of unknown significance on regular     followup. 2. History of breast cancer, node negative, on tamoxifen. [name] returns for followup, overall doing pretty well.  She was started on tamoxifen a few months ago for management of her breast cancer.  She continues taking her other medications.  These are unchanged.  She is on Prolia her osteoporosis.  She is otherwise doing well.  Of note is that she did complete her irradiation in April. Followup mammogram will be planned for next month.  PHYSICAL EXAMINATION:  General:  Pleasant alert woman, looking stated age.  Vital Signs:  Blood pressure is 122/71, temperature 98.9, pulse 82, respiratory rate 20, weight is 133.5.  Head and Neck Exam: Unremarkable.  There is no palpable adenopathy in the head and neck area.  Trachea midline.  No thyromegaly.  Breasts:  Right and left breasts are free of any masses.  Lumpectomy site is normal.  Both axillae are negative.  Abdomen:  No palpable hepatosplenomegaly.  No inguinal adenopathy.  Extremities:  No peripheral edema.  LABORATORIES: Labs from 10/21/2010 were within normal limits.  Restaging protein studies showed monoclonal IgG heavy chain with associated monoclonal free kappa light chains.  Free kappa light chains are 3.68  I discussed this with [name].  I indicated that it might be appropriate to do a followup bone marrow biopsy, as it has not been done in a number of years.  We will make those arrangements.  I will plan to see her again in followup in 6 months regardless.    ______________________________ Pierce Crane, M.D., F.R.C.P.C. PR/MEDQ  D:  12/24/2010  T:  12/24/2010  Job:  [id number]

## 2011-01-06 ENCOUNTER — Ambulatory Visit (INDEPENDENT_AMBULATORY_CARE_PROVIDER_SITE_OTHER): Payer: Medicare Other

## 2011-01-06 DIAGNOSIS — Z23 Encounter for immunization: Secondary | ICD-10-CM

## 2011-01-15 ENCOUNTER — Other Ambulatory Visit: Payer: Self-pay | Admitting: Internal Medicine

## 2011-01-15 NOTE — Telephone Encounter (Signed)
Please advise      KP 

## 2011-01-17 NOTE — Telephone Encounter (Signed)
OK X1 

## 2011-01-18 NOTE — Telephone Encounter (Signed)
RX sent

## 2011-02-01 ENCOUNTER — Encounter: Payer: Self-pay | Admitting: Internal Medicine

## 2011-03-04 ENCOUNTER — Ambulatory Visit (INDEPENDENT_AMBULATORY_CARE_PROVIDER_SITE_OTHER): Payer: Medicare Other | Admitting: Family Medicine

## 2011-03-04 ENCOUNTER — Encounter: Payer: Self-pay | Admitting: Family Medicine

## 2011-03-04 VITALS — BP 122/80 | HR 90 | Temp 98.2°F | Ht 65.0 in | Wt 133.0 lb

## 2011-03-04 DIAGNOSIS — D472 Monoclonal gammopathy: Secondary | ICD-10-CM

## 2011-03-04 DIAGNOSIS — R220 Localized swelling, mass and lump, head: Secondary | ICD-10-CM

## 2011-03-04 DIAGNOSIS — M810 Age-related osteoporosis without current pathological fracture: Secondary | ICD-10-CM | POA: Diagnosis not present

## 2011-03-04 DIAGNOSIS — Z23 Encounter for immunization: Secondary | ICD-10-CM

## 2011-03-04 DIAGNOSIS — R221 Localized swelling, mass and lump, neck: Secondary | ICD-10-CM | POA: Diagnosis not present

## 2011-03-04 DIAGNOSIS — R1310 Dysphagia, unspecified: Secondary | ICD-10-CM

## 2011-03-04 DIAGNOSIS — C50919 Malignant neoplasm of unspecified site of unspecified female breast: Secondary | ICD-10-CM | POA: Diagnosis not present

## 2011-03-04 NOTE — Patient Instructions (Signed)
Don't change your meds. See Shirlee Limerick about your referral before you leave today. Take care. Glad to see you.

## 2011-03-04 NOTE — Progress Notes (Signed)
Dysphagia.  Prev with stricture, prev improved with dilation.  Now with return feeling like "things get stuck"  L neck mass.  U/s pending.  Not ttp. H/o breast CA.  No erythema.  No trauma.    H/o R breast cancer, s/p lumpectomy and radiation, no chemo.  Mammogram 12/12.  Followed at cancer clinic.  No new complaints.   H/o MGUS, no FCNAVD. Due for PNA shot.    PMH and SH reviewed  ROS: See HPI, otherwise noncontributory.  Meds, vitals, and allergies reviewed.   GEN: nad, alert and oriented HEENT: mucous membranes moist NECK: supple w/o LA but small puffy mass on L side of neck, near clavicle. Nonpulsatile   CV: rrr. PULM: ctab, no inc wob ABD: soft, +bs EXT: no edema SKIN: no acute rash

## 2011-03-05 ENCOUNTER — Ambulatory Visit
Admission: RE | Admit: 2011-03-05 | Discharge: 2011-03-05 | Disposition: A | Discharge status: Home / Self Care | Payer: Medicare Other | Source: Ambulatory Visit | Attending: Family Medicine | Admitting: Family Medicine

## 2011-03-05 DIAGNOSIS — R221 Localized swelling, mass and lump, neck: Secondary | ICD-10-CM | POA: Diagnosis not present

## 2011-03-05 DIAGNOSIS — R220 Localized swelling, mass and lump, head: Secondary | ICD-10-CM | POA: Diagnosis not present

## 2011-03-08 ENCOUNTER — Encounter: Payer: Self-pay | Admitting: Family Medicine

## 2011-03-08 DIAGNOSIS — M810 Age-related osteoporosis without current pathological fracture: Secondary | ICD-10-CM | POA: Insufficient documentation

## 2011-03-08 DIAGNOSIS — R221 Localized swelling, mass and lump, neck: Secondary | ICD-10-CM | POA: Insufficient documentation

## 2011-03-08 NOTE — Assessment & Plan Note (Signed)
Per CA center

## 2011-03-08 NOTE — Assessment & Plan Note (Signed)
Up to date on mammogram, per cancer clinic.

## 2011-03-08 NOTE — Assessment & Plan Note (Signed)
Negative imaging on u/s, fortunately just prominent fatty tissue. Pt notified

## 2011-03-08 NOTE — Assessment & Plan Note (Signed)
Refer to GI 

## 2011-03-15 ENCOUNTER — Other Ambulatory Visit: Payer: Self-pay | Admitting: Internal Medicine

## 2011-03-15 NOTE — Telephone Encounter (Signed)
Patient is no longer a patient of Dr.Hopper's, will forward to Dr.Duncan

## 2011-03-16 NOTE — Telephone Encounter (Signed)
Sent!

## 2011-03-22 ENCOUNTER — Ambulatory Visit (INDEPENDENT_AMBULATORY_CARE_PROVIDER_SITE_OTHER): Payer: Medicare Other | Admitting: Gastroenterology

## 2011-03-22 ENCOUNTER — Encounter: Payer: Self-pay | Admitting: Gastroenterology

## 2011-03-22 VITALS — BP 104/64 | HR 80 | Ht 65.0 in | Wt 132.4 lb

## 2011-03-22 DIAGNOSIS — R1310 Dysphagia, unspecified: Secondary | ICD-10-CM

## 2011-03-22 NOTE — Patient Instructions (Signed)
Your Endoscopy is scheduled on 03/25/2011 at 11:30am on the 4 th floor  Upper GI Endoscopy Upper GI endoscopy means using a flexible scope to look at the esophagus, stomach, and upper small bowel. This is done to make a diagnosis in people with heartburn, abdominal pain, or abnormal bleeding. Sometimes an endoscope is needed to remove foreign bodies or food that become stuck in the esophagus; it can also be used to take biopsy samples. For the best results, do not eat or drink for 8 hours before having your upper endoscopy.  To perform the endoscopy, you will probably be sedated and your throat will be numbed with a special spray. The endoscope is then slowly passed down your throat (this will not interfere with your breathing). An endoscopy exam takes 15 to 30 minutes to complete and there is no real pain. Patients rarely remember much about the procedure. The results of the test may take several days if a biopsy or other test is taken.  You may have a sore throat after an endoscopy exam. Serious complications are very rare. Stick to liquids and soft foods until your pain is better. Do not drive a car or operate any dangerous equipment for at least 24 hours after being sedated. SEEK IMMEDIATE MEDICAL CARE IF:   You have severe throat pain.   You have shortness of breath.   You have bleeding problems.   You have a fever.   You have difficulty recovering from your sedation.  Document Released: 02/19/2004 Document Revised: 09/23/2010 Document Reviewed: 01/14/2008 [facility] Patient Information 2012 [place], [place].

## 2011-03-22 NOTE — Progress Notes (Signed)
History of Present Illness:  [name] has returned again complaining of dysphagia. She has a history of an esophageal stricture for which he has undergone dilatation in the past.  Last endoscopy with dilatation was September, 2011. She also has a sensation of globus.    Review of Systems: Pertinent positive and negative review of systems were noted in the above HPI section. All other review of systems were otherwise negative.    Current Medications, Allergies, Past Medical History, Past Surgical History, Family History and Social History were reviewed in [employer] electronic medical record  Vital signs were reviewed in today's medical record. Physical Exam: General: Well developed , well nourished, no acute distress Head: Normocephalic and atraumatic Eyes:  sclerae anicteric, EOMI Ears: Normal auditory acuity Mouth: No deformity or lesions Lungs: Clear throughout to auscultation Heart: Regular rate and rhythm; no murmurs, rubs or bruits Abdomen: Soft, non tender and non distended. No masses, hepatosplenomegaly or hernias noted. Normal Bowel sounds Rectal:deferred Musculoskeletal: Symmetrical with no gross deformities  Pulses:  Normal pulses noted Extremities: No clubbing, cyanosis, edema or deformities noted Neurological: Alert oriented x 4, grossly nonfocal Psychological:  Alert and cooperative. Normal mood and affect

## 2011-03-22 NOTE — Assessment & Plan Note (Signed)
She has recurring dysphagia likely secondary to a peptic stricture.  Recommendations #1 upper endoscopy with dilatation as indicated

## 2011-03-25 ENCOUNTER — Ambulatory Visit (AMBULATORY_SURGERY_CENTER): Payer: Medicare Other | Admitting: Gastroenterology

## 2011-03-25 ENCOUNTER — Encounter: Payer: Self-pay | Admitting: Gastroenterology

## 2011-03-25 DIAGNOSIS — F411 Generalized anxiety disorder: Secondary | ICD-10-CM | POA: Diagnosis not present

## 2011-03-25 DIAGNOSIS — K222 Esophageal obstruction: Secondary | ICD-10-CM | POA: Diagnosis not present

## 2011-03-25 DIAGNOSIS — R1310 Dysphagia, unspecified: Secondary | ICD-10-CM | POA: Diagnosis not present

## 2011-03-25 MED ORDER — SODIUM CHLORIDE 0.9 % IV SOLN: 500.0000 mL | INTRAVENOUS | Status: DC

## 2011-03-25 NOTE — Patient Instructions (Signed)
Discharge instructions given with verbal understanding. Handouts on stricture and a dilatation diet given. Resume previous medications. YOU HAD AN ENDOSCOPIC PROCEDURE TODAY AT THE [facility]: Refer to the procedure report that was given to you for any specific questions about what was found during the examination.  If the procedure report does not answer your questions, please call your gastroenterologist to clarify.  If you requested that your care partner not be given the details of your procedure findings, then the procedure report has been included in a sealed envelope for you to review at your convenience later.  YOU SHOULD EXPECT: Some feelings of bloating in the abdomen. Passage of more gas than usual.  Walking can help get rid of the air that was put into your GI tract during the procedure and reduce the bloating. If you had a lower endoscopy (such as a colonoscopy or flexible sigmoidoscopy) you may notice spotting of blood in your stool or on the toilet paper. If you underwent a bowel prep for your procedure, then you may not have a normal bowel movement for a few days.  DIET: Your first meal following the procedure should be a light meal and then it is ok to progress to your normal diet.  A half-sandwich or bowl of soup is an example of a good first meal.  Heavy or fried foods are harder to digest and may make you feel nauseous or bloated.  Likewise meals heavy in dairy and vegetables can cause extra gas to form and this can also increase the bloating.  Drink plenty of fluids but you should avoid alcoholic beverages for 24 hours.  ACTIVITY: Your care partner should take you home directly after the procedure.  You should plan to take it easy, moving slowly for the rest of the day.  You can resume normal activity the day after the procedure however you should NOT DRIVE or use heavy machinery for 24 hours (because of the sedation medicines used during the test).    SYMPTOMS TO REPORT  IMMEDIATELY: A gastroenterologist can be reached at any hour.  During normal business hours, 8:30 AM to 5:00 PM Monday through Friday, call [phone].  After hours and on weekends, please call the GI answering service at [phone] who will take a message and have the physician on call contact you.   Following upper endoscopy (EGD)  Vomiting of blood or coffee ground material  New chest pain or pain under the shoulder blades  Painful or persistently difficult swallowing  New shortness of breath  Fever of 100F or higher  Black, tarry-looking stools  FOLLOW UP: If any biopsies were taken you will be contacted by phone or by letter within the next 1-3 weeks.  Call your gastroenterologist if you have not heard about the biopsies in 3 weeks.  Our staff will call the home number listed on your records the next business day following your procedure to check on you and address any questions or concerns that you may have at that time regarding the information given to you following your procedure. This is a courtesy call and so if there is no answer at the home number and we have not heard from you through the emergency physician on call, we will assume that you have returned to your regular daily activities without incident.  SIGNATURES/CONFIDENTIALITY: You and/or your care partner have signed paperwork which will be entered into your electronic medical record.  These signatures attest to the fact that that   the information above on your After Visit Summary has been reviewed and is understood.  Full responsibility of the confidentiality of this discharge information lies with you and/or your care-partner. 

## 2011-03-25 NOTE — Op Note (Signed)
[facility] [address] [place], [place]  [address]  ENDOSCOPY PROCEDURE REPORT  PATIENT:  [name], [name]  MR#:  [id number] BIRTHDATE:  Jun 21, 1940, 70 yrs. old  GENDER:  female  ENDOSCOPIST:  Barbette Hair. Arlyce Dice, MD Referred by:  Crawford Givens, M.D.  PROCEDURE DATE:  03/25/2011 PROCEDURE:  EGD, diagnostic 43235, Maloney Dilation of Esophagus ASA CLASS:  Class II INDICATIONS:  dysphagia  MEDICATIONS:   MAC sedation, administered by CRNA propofol 80mg IV TOPICAL ANESTHETIC:  DESCRIPTION OF PROCEDURE:   After the risks and benefits of the procedure were explained, informed consent was obtained.  The LB GIF-H180 [id number] endoscope was introduced through the mouth and advanced to the third portion of the duodenum.  The instrument was slowly withdrawn as the mucosa was fully examined. <<PROCEDUREIMAGES>>  A stricture was found at the gastroesophageal junction (see image6). EArly stricture Dilation with maloney dilator 18mm Mild resistance; no heme  Otherwise the examination was normal (see image3 and image4).    Retroflexed views revealed no abnormalities.    The scope was then withdrawn from the patient and the procedure completed.  COMPLICATIONS:  None  ENDOSCOPIC IMPRESSION: 1) Stricture at the gastroesophageal junction - s/p maloney dilitation 2) Otherwise normal examination RECOMMENDATIONS: 1) dilatations PRN  ______________________________ Barbette Hair. Arlyce Dice, MD  CC:  n. eSIGNED:   Barbette Hair. Myrtice Lowdermilk at 03/25/2011 11:43 AM  Carolan Clines, [phone]
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
## 2011-03-26 ENCOUNTER — Telehealth: Payer: Self-pay | Admitting: *Deleted

## 2011-03-26 ENCOUNTER — Telehealth: Payer: Self-pay | Admitting: Family Medicine

## 2011-03-26 NOTE — Telephone Encounter (Signed)
  Follow up Call-  Call back number 03/25/2011  Post procedure Call Back phone  # [phone]  Permission to leave phone message Yes   Pt was asleep, spoke with her husband Patient questions:  Do you have a fever, pain , or abdominal swelling? no Pain Score  0 *  Have you tolerated food without any problems? yes  Have you been able to return to your normal activities? yes  Do you have any questions about your discharge instructions: Diet   no Medications  no Follow up visit  no  Do you have questions or concerns about your Care? no  Actions: * If pain score is 4 or above: No action needed, pain <4.

## 2011-03-26 NOTE — Telephone Encounter (Signed)
Spoke with pt and set her up with Dr .Patsy Lager on Monday 03/29/11
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
## 2011-03-26 NOTE — Telephone Encounter (Signed)
I would rec Dr. Patsy Lager.

## 2011-03-29 ENCOUNTER — Encounter: Payer: Self-pay | Admitting: Family Medicine

## 2011-03-29 ENCOUNTER — Ambulatory Visit (INDEPENDENT_AMBULATORY_CARE_PROVIDER_SITE_OTHER): Payer: Medicare Other | Admitting: Family Medicine

## 2011-03-29 VITALS — BP 120/72 | HR 84 | Temp 97.0°F | Ht 65.0 in | Wt 134.4 lb

## 2011-03-29 DIAGNOSIS — B351 Tinea unguium: Secondary | ICD-10-CM | POA: Diagnosis not present

## 2011-03-29 NOTE — Progress Notes (Signed)
  Patient Name: [name] Date of Birth: 12-29-1940 Age: 71 y.o. Medical Record Number: [id number] Gender: female Date of Encounter: 03/29/2011  History of Present Illness:  [name] is a 71 y.o. very pleasant female patient who presents with the following:  Had ? If r medial toenail ingrown. Had been painful, not it is not. No longer red, and not hurting.  Long term nail changes great toes  Past Medical History, Surgical History, Social History, Family History, Problem List, Medications, and Allergies have been reviewed and updated if relevant.  Review of Systems: ROS: GEN: Acute illness details above GI: Tolerating PO intake GU: maintaining adequate hydration and urination Pulm: No SOB Interactive and getting along well at home.  Otherwise, ROS is as per the HPI.   Physical Examination: Filed Vitals:   03/29/11 1519  BP: 120/72  Pulse: 84  Temp: 97 F (36.1 C)  TempSrc: Oral  Height: 5\' 5"  (1.651 m)  Weight: 134 lb 6.4 oz (60.963 kg)  SpO2: 100%    Body mass index is 22.37 kg/(m^2).   GEN: WDWN, NAD, Non-toxic, Alert & Oriented x 3 HEENT: Atraumatic, Normocephalic.  Ears and Nose: No external deformity. EXTR: No clubbing/cyanosis/edema. Fungus, great toes. NT NEURO: Normal gait.  PSYCH: Normally interactive. Conversant. Not depressed or anxious appearing.  Calm demeanor.    Assessment and Plan: 1. Onychomycosis     Not ingrown now or resolving. Soak and work on medial edge  Would not treat toenail fungus - can try vicks vaporub

## 2011-05-10 ENCOUNTER — Encounter: Payer: Self-pay | Admitting: *Deleted

## 2011-05-10 ENCOUNTER — Ambulatory Visit
Admission: RE | Admit: 2011-05-10 | Discharge: 2011-05-10 | Disposition: A | Discharge status: Home / Self Care | Payer: Medicare Other | Source: Ambulatory Visit | Attending: Radiation Oncology | Admitting: Radiation Oncology

## 2011-05-10 ENCOUNTER — Encounter: Payer: Self-pay | Admitting: Radiation Oncology

## 2011-05-10 VITALS — BP 108/68 | HR 85 | Temp 98.0°F | Wt 134.5 lb

## 2011-05-10 DIAGNOSIS — C50919 Malignant neoplasm of unspecified site of unspecified female breast: Secondary | ICD-10-CM

## 2011-05-10 NOTE — Progress Notes (Signed)
05/10/11- [id number] [id number] 12 months post RT visit- The pt was into the cancer center this am to see Dr. Roselind Messier for her 12 months post RT appt.  The pt completed her questionnaires today and then her photos were taken per protocol.  The pt was seen and examined today by Dr. Roselind Messier.  She states that she is continuing to take tamoxifen with minimal side effects.  She reports that she had a basal cell carcinoma removed from her neck.  She said that she will see her dermatologist in 6 months for follow-up.  She states that she only uses [employer] Therapy lotion on her affected breast now.  She is continuing to see Dr. Donnie Coffin and Dr. Dwain Sarna for follow-up regarding her breast cancer.  Dr. Roselind Messier said that he will not have to see her back unless she has any questions or concerns.  The pt was thanked for her participation on this study.  She said that she would be willing to participate in more studies if she is eligible.

## 2011-05-10 NOTE — Progress Notes (Signed)
CC:   Megan Gosling, MD Pierce Crane, M.D., F.R.C.P.C.  DIAGNOSIS:  Stage I right breast cancer.  INTERVAL SINCE RADIATION THERAPY:  1 year.  NARRATIVE:  [name] comes in today for routine followup.  She clinically seems to be doing well at this time.  The patient continues on tamoxifen and seems to be tolerating this reasonably well.  She occasionally will have some mild hot flashes.  The patient denies any consistent pain in the breast area, nipple discharge, or bleeding.  EXAMINATION:  Vital Signs:  The patient's temperature is 98.0, pulse 85, blood pressure is 108/68.  Weight is 134 pounds.  Examination of the neck and supraclavicular region reveals no evidence of adenopathy.  The axillary areas are free of adenopathy.  Examination of the lungs reveals them to be clear.  The heart has a regular rhythm and rate.  Examination of the left breast reveals no mass or nipple discharge.  Examination of the right breast area reveals some induration at the lumpectomy site. There are some mild hyperpigmentation changes and mild edema in the breast.  There is no dominant mass appreciated in the breast, nipple discharge, or bleeding.  IMPRESSION AND PLAN:  Clinically no evidence of disease.  In light of the patient's close followup with Dr. Donnie Coffin and Dr. Dwain Sarna, I have not scheduled [name] for formal followup appointment but would be glad to see her at any time.    ______________________________ Billie Lade, Ph.D., M.D. JDK/MEDQ  D:  05/10/2011  T:  05/10/2011  Job:  [id number]

## 2011-05-10 NOTE — Progress Notes (Signed)
Here for routine follow post completion of radiation of right breast in April of 2012. Patient has some soreness of right breast only on touch. Takes tamoxifen and prolia. Patient currently completing survey per research protocol. Patient had excision of basal cell of left neck since last follow up here.

## 2011-06-15 ENCOUNTER — Ambulatory Visit (HOSPITAL_BASED_OUTPATIENT_CLINIC_OR_DEPARTMENT_OTHER): Payer: Medicare Other | Admitting: Oncology

## 2011-06-15 ENCOUNTER — Other Ambulatory Visit (HOSPITAL_BASED_OUTPATIENT_CLINIC_OR_DEPARTMENT_OTHER): Payer: Medicare Other

## 2011-06-15 VITALS — BP 112/70 | HR 71 | Temp 98.0°F | Ht 65.0 in | Wt 129.8 lb

## 2011-06-15 DIAGNOSIS — Z1231 Encounter for screening mammogram for malignant neoplasm of breast: Secondary | ICD-10-CM

## 2011-06-15 DIAGNOSIS — D472 Monoclonal gammopathy: Secondary | ICD-10-CM | POA: Diagnosis not present

## 2011-06-15 DIAGNOSIS — E559 Vitamin D deficiency, unspecified: Secondary | ICD-10-CM

## 2011-06-15 DIAGNOSIS — C50919 Malignant neoplasm of unspecified site of unspecified female breast: Secondary | ICD-10-CM

## 2011-06-15 DIAGNOSIS — M810 Age-related osteoporosis without current pathological fracture: Secondary | ICD-10-CM | POA: Diagnosis not present

## 2011-06-15 LAB — CBC WITH DIFFERENTIAL/PLATELET
BASO%: 0.5 % (ref 0.0–2.0)
Basophils Absolute: 0 10*3/uL (ref 0.0–0.1)
EOS%: 7.7 % — ABNORMAL HIGH (ref 0.0–7.0)
Eosinophils Absolute: 0.4 10*3/uL (ref 0.0–0.5)
HCT: 37.7 % (ref 34.8–46.6)
HGB: 12.5 g/dL (ref 11.6–15.9)
LYMPH%: 33.5 % (ref 14.0–49.7)
MCH: 33 pg (ref 25.1–34.0)
MCHC: 33.1 g/dL (ref 31.5–36.0)
MCV: 99.8 fL (ref 79.5–101.0)
MONO#: 0.3 10*3/uL (ref 0.1–0.9)
MONO%: 5.4 % (ref 0.0–14.0)
NEUT#: 2.9 10*3/uL (ref 1.5–6.5)
NEUT%: 52.9 % (ref 38.4–76.8)
Platelets: 212 10*3/uL (ref 145–400)
RBC: 3.78 10*6/uL (ref 3.70–5.45)
RDW: 13.8 % (ref 11.2–14.5)
WBC: 5.6 10*3/uL (ref 3.9–10.3)
lymph#: 1.9 10*3/uL (ref 0.9–3.3)
nRBC: 0 % (ref 0–0)

## 2011-06-15 MED ORDER — DENOSUMAB 60 MG/ML ~~LOC~~ SOLN
60.0000 mg | Freq: Once | SUBCUTANEOUS | Status: DC
  Filled 2011-06-15: qty 1

## 2011-06-15 NOTE — Progress Notes (Signed)
Hematology and Oncology Follow Up Visit  [name] [id number] [birth year] 71 y.o. 06/15/2011 5:51 PM   DIAGNOSIS:   Encounter Diagnoses  Name Primary?  . Breast cancer 03/05/10 T1bN0 er/pr+ Yes  . MGUS (monoclonal gammopathy of unknown significance) 2006  . Unspecified vitamin D deficiency   osteoperosis  PAST THERAPY:  Lumpectomy 2/12 xrt 6/12 Tamoxifen  Interim History:  [name] returns for f/u; she is doing well, without specifc complaints. She has had no intercurrent illness or hospitilizations.   Medications: I have reviewed the patient's current medications.  Allergies:  Allergies  Allergen Reactions  . Penicillins Shortness Of Breath and Itching    Past Medical History, Surgical history, Social history, and Family History were reviewed and updated.  Review of Systems: Constitutional:  Negative for fever, chills, night sweats, anorexia, weight loss, pain. Cardiovascular: negative Respiratory: negative Neurological: negative Dermatological: negative ENT: negative Skin Gastrointestinal: no abdominal pain, change in bowel habits, or black or bloody stools Genito-Urinary: negative Hematological and Lymphatic: negative Breast: negative Musculoskeletal: negative Remaining ROS negative.  Physical Exam:  Blood pressure 112/70, pulse 71, temperature 98 F (36.7 C), height 5\' 5"  (1.651 m), weight 129 lb 12.8 oz (58.877 kg).  ECOG: 0   HEENT:  Sclerae anicteric, conjunctivae pink.  Oropharynx clear.  No mucositis or candidiasis.  Nodes:  No cervical, supraclavicular, or axillary lymphadenopathy palpated.  Breast Exam:  Right breast is benign.  No masses, discharge, skin change, or nipple inversion.  Left breast is benign.  No masses, discharge, skin change, or nipple inversion..  Lungs:  Clear to auscultation bilaterally.  No crackles, rhonchi, or wheezes.  Heart:  Regular rate and rhythm.  Abdomen:  Soft, nontender.  Positive bowel sounds.  No organomegaly  or masses palpated.  Musculoskeletal:  No focal spinal tenderness to palpation.  Extremities:  Benign.  No peripheral edema or cyanosis.  Skin:  Benign.  Neuro:  Nonfocal.    Lab Results: Lab Results  Component Value Date   WBC 5.6 06/15/2011   HGB 12.5 06/15/2011   HCT 37.7 06/15/2011   MCV 99.8 06/15/2011   PLT 212 06/15/2011     Chemistry      Component Value Date/Time   NA 142 06/15/2011 1327   K 4.2 06/15/2011 1327   CL 108 06/15/2011 1327   CO2 26 06/15/2011 1327   BUN 9 06/15/2011 1327   CREATININE 0.85 06/15/2011 1327      Component Value Date/Time   CALCIUM 8.9 06/15/2011 1327   ALKPHOS 44 06/15/2011 1327   AST 25 06/15/2011 1327   ALT 14 06/15/2011 1327   BILITOT 0.3 06/15/2011 1327       Radiological Studies:  No results found.   IMPRESSIONS AND PLAN: A 71 y.o. female with   Hx of MGUS, mspike 1.13, IgG 1780, kappa light chains 3.19 Hx of node negative breast cancer and osteoperosis on tamoxifen and prolia.   We will continue to see her in 6 month intervals; she appears to be doing well.  Spent more than half the time coordinating care.    [name] 5/21/20135:51 PM

## 2011-06-17 LAB — COMPREHENSIVE METABOLIC PANEL
ALT: 14 U/L (ref 0–35)
AST: 25 U/L (ref 0–37)
Albumin: 3.7 g/dL (ref 3.5–5.2)
Alkaline Phosphatase: 44 U/L (ref 39–117)
BUN: 9 mg/dL (ref 6–23)
CO2: 26 mEq/L (ref 19–32)
Calcium: 8.9 mg/dL (ref 8.4–10.5)
Chloride: 108 mEq/L (ref 96–112)
Creatinine, Ser: 0.85 mg/dL (ref 0.50–1.10)
Glucose, Bld: 132 mg/dL — ABNORMAL HIGH (ref 70–99)
Potassium: 4.2 mEq/L (ref 3.5–5.3)
Sodium: 142 mEq/L (ref 135–145)
Total Bilirubin: 0.3 mg/dL (ref 0.3–1.2)
Total Protein: 6.7 g/dL (ref 6.0–8.3)

## 2011-06-17 LAB — KAPPA/LAMBDA LIGHT CHAINS
Kappa free light chain: 3.19 mg/dL — ABNORMAL HIGH (ref 0.33–1.94)
Kappa:Lambda Ratio: 2.7 — ABNORMAL HIGH (ref 0.26–1.65)
Lambda Free Lght Chn: 1.18 mg/dL (ref 0.57–2.63)

## 2011-06-17 LAB — PROTEIN ELECTROPHORESIS, SERUM, WITH REFLEX
Albumin ELP: 55 % — ABNORMAL LOW (ref 55.8–66.1)
Alpha-1-Globulin: 4.1 % (ref 2.9–4.9)
Alpha-2-Globulin: 8.3 % (ref 7.1–11.8)
Beta 2: 21.2 % — ABNORMAL HIGH (ref 3.2–6.5)
Beta Globulin: 5.2 % (ref 4.7–7.2)
Gamma Globulin: 6.2 % — ABNORMAL LOW (ref 11.1–18.8)
M-Spike, %: 1.13 g/dL
Total Protein, Serum Electrophoresis: 6.7 g/dL (ref 6.0–8.3)

## 2011-06-17 LAB — IGG, IGA, IGM
IgA: 42 mg/dL — ABNORMAL LOW (ref 69–380)
IgG (Immunoglobin G), Serum: 1780 mg/dL — ABNORMAL HIGH (ref 690–1700)
IgM, Serum: 34 mg/dL — ABNORMAL LOW (ref 52–322)

## 2011-06-17 LAB — IFE INTERPRETATION

## 2011-06-17 LAB — VITAMIN D 25 HYDROXY (VIT D DEFICIENCY, FRACTURES): Vit D, 25-Hydroxy: 35 ng/mL (ref 30–89)

## 2011-06-17 LAB — BETA 2 MICROGLOBULIN, SERUM: Beta-2 Microglobulin: 1.46 mg/L (ref 1.01–1.73)

## 2011-06-25 ENCOUNTER — Other Ambulatory Visit: Payer: Self-pay | Admitting: Oncology

## 2011-06-25 DIAGNOSIS — C50919 Malignant neoplasm of unspecified site of unspecified female breast: Secondary | ICD-10-CM

## 2011-07-14 DIAGNOSIS — Z85828 Personal history of other malignant neoplasm of skin: Secondary | ICD-10-CM | POA: Diagnosis not present

## 2011-07-14 DIAGNOSIS — D235 Other benign neoplasm of skin of trunk: Secondary | ICD-10-CM | POA: Diagnosis not present

## 2011-07-14 DIAGNOSIS — L538 Other specified erythematous conditions: Secondary | ICD-10-CM | POA: Diagnosis not present

## 2011-07-15 ENCOUNTER — Ambulatory Visit (HOSPITAL_BASED_OUTPATIENT_CLINIC_OR_DEPARTMENT_OTHER): Payer: Medicare Other | Admitting: Oncology

## 2011-07-15 VITALS — BP 100/67 | HR 76 | Temp 97.8°F | Ht 65.0 in | Wt 127.0 lb

## 2011-07-15 DIAGNOSIS — C50919 Malignant neoplasm of unspecified site of unspecified female breast: Secondary | ICD-10-CM

## 2011-07-15 DIAGNOSIS — N6459 Other signs and symptoms in breast: Secondary | ICD-10-CM

## 2011-07-15 NOTE — Progress Notes (Signed)
Hematology and Oncology Follow Up Visit  [name] [id number] Jan 11, 1941 71 y.o. 07/15/2011 10:38 AM   Principle Diagnosis: Pleasant 71 year old woman history of node-negative breast cancer status post completion of radiation 05/06/2010 currently on tamoxifen as well as prolia. History of monoclonal gammopathy of unknown significance on observation  Interim History: Patient returns for followup unscheduled visit. She noted diffuse redness of her right breast yesterday. He was somewhat hot. She did not feel fevers. She was at her dermatologist office who is treating her for a rash on her abdomen. She recommended that she see [place] at today. Medications: I have reviewed the patient's current medications.  Allergies:  Allergies  Allergen Reactions  . Penicillins Shortness Of Breath and Itching    Past Medical History, Surgical history, Social history, and Family History were reviewed and updated.  Review of Systems: Constitutional:  Negative for fever, chills, night sweats, anorexia, weight loss, pain. Cardiovascular: no chest pain or dyspnea on exertion Respiratory: no cough, shortness of breath, or wheezing Neurological: no TIA or stroke symptoms Dermatological: negative ENT: negative Skin Gastrointestinal: no abdominal pain, change in bowel habits, or black or bloody stools Genito-Urinary: no dysuria, trouble voiding, or hematuria Hematological and Lymphatic: negative Breast: negative for breast lumps Musculoskeletal: negative Remaining ROS negative.  Physical Exam: Blood pressure 100/67, pulse 76, temperature 97.8 F (36.6 C), height 5\' 5"  (1.651 m), weight 127 lb (57.607 kg). ECOG: 0 General appearance: alert, cooperative and appears stated age Head: Normocephalic, without obvious abnormality, atraumatic Neck: no adenopathy, no carotid bruit, no JVD, supple, symmetrical, trachea midline and thyroid not enlarged, symmetric, no tenderness/mass/nodules Lymph nodes:  Cervical, supraclavicular, and axillary nodes normal. Cardiac : Normal heart sounds Pulmonary: Normal breath sounds Breasts: Right breast has some mild erythema to it and some blanching. There is no tenderness masses or other abnormalities noted. This seems to be dependent as she seems to improve if she lies down. Abdomen: Soft nontender, no organomegaly or masses Extremities normal Neuro: Normal  Lab Results: Lab Results  Component Value Date   WBC 5.6 06/15/2011   HGB 12.5 06/15/2011   HCT 37.7 06/15/2011   MCV 99.8 06/15/2011   PLT 212 06/15/2011     Chemistry      Component Value Date/Time   NA 142 06/15/2011 1327   K 4.2 06/15/2011 1327   CL 108 06/15/2011 1327   CO2 26 06/15/2011 1327   BUN 9 06/15/2011 1327   CREATININE 0.85 06/15/2011 1327      Component Value Date/Time   CALCIUM 8.9 06/15/2011 1327   ALKPHOS 44 06/15/2011 1327   AST 25 06/15/2011 1327   ALT 14 06/15/2011 1327   BILITOT 0.3 06/15/2011 1327       Radiological Studies: chest X-ray n/a Mammogram pening Bone density n/a  Impression and Plan: I'm not fairly suspicious that she has a recurrence of cancer or even cellulitis. I've recommended that she calls next week if this is not improving..  More than 50% of the visit was spent in patient-related counselling   Pierce Crane, MD 6/20/201310:38 AM

## 2011-11-22 ENCOUNTER — Ambulatory Visit (INDEPENDENT_AMBULATORY_CARE_PROVIDER_SITE_OTHER): Payer: Medicare Other | Admitting: General Surgery

## 2011-11-25 ENCOUNTER — Ambulatory Visit (INDEPENDENT_AMBULATORY_CARE_PROVIDER_SITE_OTHER): Payer: Medicare Other | Admitting: General Surgery

## 2011-11-29 ENCOUNTER — Ambulatory Visit (INDEPENDENT_AMBULATORY_CARE_PROVIDER_SITE_OTHER): Payer: Medicare Other | Admitting: General Surgery

## 2011-11-29 ENCOUNTER — Encounter (INDEPENDENT_AMBULATORY_CARE_PROVIDER_SITE_OTHER): Payer: Self-pay | Admitting: General Surgery

## 2011-11-29 VITALS — BP 118/64 | HR 72 | Temp 98.5°F | Resp 16 | Ht 65.0 in | Wt 129.6 lb

## 2011-11-29 DIAGNOSIS — Z853 Personal history of malignant neoplasm of breast: Secondary | ICD-10-CM

## 2011-11-29 NOTE — Progress Notes (Signed)
Subjective:     Patient ID: [name], female   DOB: July 26, 1940, 71 y.o.   MRN: [id number]  HPI This is a 71 year old female who had a stage I right breast cancer that she underwent a lumpectomy and a right axillary sentinel lymph node biopsy. This was hormone receptor positive. She underwent radiation therapy and has since been on tamoxifen. She's been tolerating her tamoxifen well. She has no real complaints referable to her breast right now. Her husband has been ill of late she is open with him. She had a mammogram in December of 2012 that was normal and is due to get the next one in December of 2013.she had some redness of her breast and was seen before I medical oncology. This appears to all of resolved and she does not complain of that right now.  Review of Systems     Objective:   Physical Exam  Vitals reviewed. Constitutional: She appears well-developed and well-nourished.  Pulmonary/Chest: Right breast exhibits no inverted nipple, no mass, no nipple discharge, no skin change and no tenderness. Left breast exhibits no inverted nipple, no mass, no nipple discharge, no skin change and no tenderness. Breasts are symmetrical.    Lymphadenopathy:    She has no cervical adenopathy.    She has no axillary adenopathy.       Right: No supraclavicular adenopathy present.       Left: No supraclavicular adenopathy present.       Assessment:     Stage I right breast cancer status post lumpectomy, sentinel node biopsy, radiation therapy, and now on tamoxifen    Plan:     She has no clinical evidence of recurrence. She is going to get her mammogram in December. I encouraged her monthly self exams. I told her that I would happy to see her annual for an exam significantly she can be seen by medical oncology.

## 2011-11-29 NOTE — Patient Instructions (Signed)

## 2011-12-01 ENCOUNTER — Ambulatory Visit (INDEPENDENT_AMBULATORY_CARE_PROVIDER_SITE_OTHER): Payer: Medicare Other

## 2011-12-01 DIAGNOSIS — Z23 Encounter for immunization: Secondary | ICD-10-CM | POA: Diagnosis not present

## 2011-12-14 ENCOUNTER — Telehealth: Payer: Self-pay | Admitting: *Deleted

## 2011-12-14 NOTE — Telephone Encounter (Signed)
Patient called back and confirmed over the phone the new date and time 

## 2011-12-14 NOTE — Telephone Encounter (Signed)
per the md going to be on vac moved patient appointment to 12-20-2011 starting at 12:30  Left voice message to inform the  Patient of the new date and time

## 2011-12-15 ENCOUNTER — Telehealth: Payer: Self-pay | Admitting: *Deleted

## 2011-12-15 NOTE — Telephone Encounter (Signed)
Patient called confirmed over the phone the new date and time

## 2011-12-16 ENCOUNTER — Ambulatory Visit: Payer: Medicare Other | Admitting: Oncology

## 2011-12-16 ENCOUNTER — Other Ambulatory Visit (HOSPITAL_BASED_OUTPATIENT_CLINIC_OR_DEPARTMENT_OTHER): Payer: Medicare Other | Admitting: Lab

## 2011-12-16 DIAGNOSIS — D472 Monoclonal gammopathy: Secondary | ICD-10-CM | POA: Diagnosis not present

## 2011-12-16 DIAGNOSIS — E559 Vitamin D deficiency, unspecified: Secondary | ICD-10-CM

## 2011-12-16 DIAGNOSIS — C50919 Malignant neoplasm of unspecified site of unspecified female breast: Secondary | ICD-10-CM

## 2011-12-16 DIAGNOSIS — M810 Age-related osteoporosis without current pathological fracture: Secondary | ICD-10-CM | POA: Diagnosis not present

## 2011-12-16 LAB — CBC WITH DIFFERENTIAL/PLATELET
BASO%: 0.5 % (ref 0.0–2.0)
Basophils Absolute: 0 10*3/uL (ref 0.0–0.1)
EOS%: 3.7 % (ref 0.0–7.0)
Eosinophils Absolute: 0.2 10*3/uL (ref 0.0–0.5)
HCT: 35.4 % (ref 34.8–46.6)
HGB: 12.1 g/dL (ref 11.6–15.9)
LYMPH%: 41.3 % (ref 14.0–49.7)
MCH: 34.3 pg — ABNORMAL HIGH (ref 25.1–34.0)
MCHC: 34.1 g/dL (ref 31.5–36.0)
MCV: 100.7 fL (ref 79.5–101.0)
MONO#: 0.3 10*3/uL (ref 0.1–0.9)
MONO%: 6 % (ref 0.0–14.0)
NEUT#: 2.7 10*3/uL (ref 1.5–6.5)
NEUT%: 48.5 % (ref 38.4–76.8)
Platelets: 180 10*3/uL (ref 145–400)
RBC: 3.51 10*6/uL — ABNORMAL LOW (ref 3.70–5.45)
RDW: 13.7 % (ref 11.2–14.5)
WBC: 5.6 10*3/uL (ref 3.9–10.3)
lymph#: 2.3 10*3/uL (ref 0.9–3.3)

## 2011-12-16 LAB — COMPREHENSIVE METABOLIC PANEL (CC13)
ALT: 17 U/L (ref 0–55)
AST: 27 U/L (ref 5–34)
Albumin: 3.5 g/dL (ref 3.5–5.0)
Alkaline Phosphatase: 51 U/L (ref 40–150)
BUN: 10 mg/dL (ref 7.0–26.0)
CO2: 31 mEq/L — ABNORMAL HIGH (ref 22–29)
Calcium: 10.3 mg/dL (ref 8.4–10.4)
Chloride: 106 mEq/L (ref 98–107)
Creatinine: 0.8 mg/dL (ref 0.6–1.1)
Glucose: 84 mg/dl (ref 70–99)
Potassium: 3.8 mEq/L (ref 3.5–5.1)
Sodium: 142 mEq/L (ref 136–145)
Total Bilirubin: 0.29 mg/dL (ref 0.20–1.20)
Total Protein: 7.1 g/dL (ref 6.4–8.3)

## 2011-12-16 LAB — LACTATE DEHYDROGENASE (CC13): LDH: 161 U/L (ref 125–245)

## 2011-12-17 ENCOUNTER — Other Ambulatory Visit: Payer: Medicare Other | Admitting: Lab

## 2011-12-21 LAB — IMMUNOFIXATION ELECTROPHORESIS
IgA: 47 mg/dL — ABNORMAL LOW (ref 69–380)
IgG (Immunoglobin G), Serum: 2100 mg/dL — ABNORMAL HIGH (ref 690–1700)
IgM, Serum: 42 mg/dL — ABNORMAL LOW (ref 52–322)
Total Protein, Serum Electrophoresis: 7.3 g/dL (ref 6.0–8.3)

## 2011-12-21 LAB — KAPPA/LAMBDA LIGHT CHAINS
Kappa free light chain: 15.7 mg/dL — ABNORMAL HIGH (ref 0.33–1.94)
Kappa:Lambda Ratio: 3.95 — ABNORMAL HIGH (ref 0.26–1.65)
Lambda Free Lght Chn: 3.97 mg/dL — ABNORMAL HIGH (ref 0.57–2.63)

## 2011-12-21 LAB — VITAMIN D 25 HYDROXY (VIT D DEFICIENCY, FRACTURES): Vit D, 25-Hydroxy: 59 ng/mL (ref 30–89)

## 2011-12-22 ENCOUNTER — Ambulatory Visit (HOSPITAL_BASED_OUTPATIENT_CLINIC_OR_DEPARTMENT_OTHER): Payer: Medicare Other | Admitting: Oncology

## 2011-12-22 VITALS — BP 101/64 | HR 80 | Temp 98.3°F | Resp 20 | Ht 65.0 in | Wt 126.8 lb

## 2011-12-22 DIAGNOSIS — M810 Age-related osteoporosis without current pathological fracture: Secondary | ICD-10-CM

## 2011-12-22 DIAGNOSIS — R1310 Dysphagia, unspecified: Secondary | ICD-10-CM

## 2011-12-22 DIAGNOSIS — M8580 Other specified disorders of bone density and structure, unspecified site: Secondary | ICD-10-CM

## 2011-12-22 DIAGNOSIS — I951 Orthostatic hypotension: Secondary | ICD-10-CM

## 2011-12-22 DIAGNOSIS — D649 Anemia, unspecified: Secondary | ICD-10-CM

## 2011-12-22 DIAGNOSIS — R221 Localized swelling, mass and lump, neck: Secondary | ICD-10-CM

## 2011-12-22 DIAGNOSIS — E049 Nontoxic goiter, unspecified: Secondary | ICD-10-CM

## 2011-12-22 DIAGNOSIS — D472 Monoclonal gammopathy: Secondary | ICD-10-CM

## 2011-12-22 DIAGNOSIS — K3189 Other diseases of stomach and duodenum: Secondary | ICD-10-CM

## 2011-12-22 DIAGNOSIS — R5383 Other fatigue: Secondary | ICD-10-CM

## 2011-12-22 DIAGNOSIS — C50919 Malignant neoplasm of unspecified site of unspecified female breast: Secondary | ICD-10-CM

## 2011-12-22 DIAGNOSIS — E785 Hyperlipidemia, unspecified: Secondary | ICD-10-CM

## 2011-12-22 DIAGNOSIS — R5381 Other malaise: Secondary | ICD-10-CM

## 2011-12-22 MED ORDER — DENOSUMAB 60 MG/ML ~~LOC~~ SOLN
60.0000 mg | Freq: Once | SUBCUTANEOUS | Status: AC
  Administered 2011-12-22: 60 mg via SUBCUTANEOUS
  Filled 2011-12-22: qty 1

## 2011-12-22 MED ORDER — SODIUM CHLORIDE 0.9 % IV SOLN: Freq: Once | INTRAVENOUS | Status: DC

## 2011-12-22 NOTE — Progress Notes (Signed)
Hematology and Oncology Follow Up Visit  [name] [id number] [birth year] 71 y.o. 12/22/2011 2:59 PM   Principle Diagnosis: Pleasant 72 year old woman history of node-negative breast cancer status post completion of radiation 05/06/2010 currently on tamoxifen as well as prolia. History of monoclonal gammopathy of unknown significance on observation since 2007  Interim History: Patient returns for followup she is doing fairly well. She recently had a protein studies done. She is due for mammogram in December. She has no other complaints. I believe she is due for prolia  today as well.   Medications: I have reviewed the patient's current medications.  Allergies:  Allergies  Allergen Reactions  . Penicillins Shortness Of Breath and Itching    Past Medical History, Surgical history, Social history, and Family History were reviewed and updated.  Review of Systems: Constitutional:  Negative for fever, chills, night sweats, anorexia, weight loss, pain. Cardiovascular: no chest pain or dyspnea on exertion Respiratory: no cough, shortness of breath, or wheezing Neurological: no TIA or stroke symptoms Dermatological: negative ENT: negative Skin Gastrointestinal: no abdominal pain, change in bowel habits, or black or bloody stools Genito-Urinary: no dysuria, trouble voiding, or hematuria Hematological and Lymphatic: negative Breast: negative for breast lumps Musculoskeletal: negative Remaining ROS negative.  Physical Exam: Blood pressure 101/64, pulse 80, temperature 98.3 F (36.8 C), resp. rate 20, height 5\' 5"  (1.651 m), weight 126 lb 12.8 oz (57.516 kg). ECOG: 0 General appearance: alert, cooperative and appears stated age Head: Normocephalic, without obvious abnormality, atraumatic Neck: no adenopathy, no carotid bruit, no JVD, supple, symmetrical, trachea midline and thyroid not enlarged, symmetric, no tenderness/mass/nodules Lymph nodes: Cervical, supraclavicular, and  axillary nodes normal. Cardiac : Normal heart sounds Pulmonary: Normal breath sounds Breasts: Right breast has some mild erythema to it and some blanching. There is no tenderness masses or other abnormalities noted. This seems to be dependent as she seems to improve if she lies down. Abdomen: Soft nontender, no organomegaly or masses Extremities normal Neuro: Normal  Lab Results: Lab Results  Component Value Date   WBC 5.6 12/16/2011   HGB 12.1 12/16/2011   HCT 35.4 12/16/2011   MCV 100.7 12/16/2011   PLT 180 12/16/2011     Chemistry      Component Value Date/Time   NA 142 12/16/2011 1345   NA 142 06/15/2011 1327   K 3.8 12/16/2011 1345   K 4.2 06/15/2011 1327   CL 106 12/16/2011 1345   CL 108 06/15/2011 1327   CO2 31* 12/16/2011 1345   CO2 26 06/15/2011 1327   BUN 10.0 12/16/2011 1345   BUN 9 06/15/2011 1327   CREATININE 0.8 12/16/2011 1345   CREATININE 0.85 06/15/2011 1327      Component Value Date/Time   CALCIUM 10.3 12/16/2011 1345   CALCIUM 8.9 06/15/2011 1327   ALKPHOS 51 12/16/2011 1345   ALKPHOS 44 06/15/2011 1327   AST 27 12/16/2011 1345   AST 25 06/15/2011 1327   ALT 17 12/16/2011 1345   ALT 14 06/15/2011 1327   BILITOT 0.29 12/16/2011 1345   BILITOT 0.3 06/15/2011 1327       Radiological Studies: chest X-ray n/a Mammogram To December 2013 Bone density Due 3/14  Impression and Plan:  Stage I breast cancer due for mammogram December 2013 History of osteoporosis on Prolene due for a repeat bone density March 2014 History of MGUS- . We'll followup on light chain studies in 3 months as her latest value show some increase as compared to her baseline.  More than 50% of the visit was spent in patient-related counselling   Pierce Crane, MD 11/27/20132:59 PM

## 2011-12-27 ENCOUNTER — Telehealth: Payer: Self-pay | Admitting: Oncology

## 2011-12-27 NOTE — Telephone Encounter (Signed)
S/w the pt and she is aware of her [facility] density appt [facility] in [facility] 2014. Pt is already scheduled for her mammogram at the [facility] center in dec

## 2012-01-24 DIAGNOSIS — Z853 Personal history of malignant neoplasm of breast: Secondary | ICD-10-CM | POA: Diagnosis not present

## 2012-02-25 ENCOUNTER — Encounter: Payer: Self-pay | Admitting: Oncology

## 2012-02-25 ENCOUNTER — Telehealth: Payer: Self-pay | Admitting: *Deleted

## 2012-02-25 NOTE — Telephone Encounter (Signed)
Called and spoke with patient to reschedule her appt.  Confirmed appt. With Ramond Craver, NP on 03/21/12 at 145.  Then will become Dr. Welton Flakes.

## 2012-03-21 ENCOUNTER — Telehealth: Payer: Self-pay | Admitting: Oncology

## 2012-03-21 ENCOUNTER — Ambulatory Visit: Payer: Medicare Other | Admitting: Lab

## 2012-03-21 ENCOUNTER — Ambulatory Visit (HOSPITAL_BASED_OUTPATIENT_CLINIC_OR_DEPARTMENT_OTHER): Payer: Medicare Other | Admitting: Nurse Practitioner

## 2012-03-21 VITALS — BP 117/71 | HR 83 | Temp 98.3°F | Resp 20 | Ht 65.0 in | Wt 128.0 lb

## 2012-03-21 DIAGNOSIS — C50911 Malignant neoplasm of unspecified site of right female breast: Secondary | ICD-10-CM

## 2012-03-21 DIAGNOSIS — R1310 Dysphagia, unspecified: Secondary | ICD-10-CM

## 2012-03-21 DIAGNOSIS — D472 Monoclonal gammopathy: Secondary | ICD-10-CM

## 2012-03-21 DIAGNOSIS — R5381 Other malaise: Secondary | ICD-10-CM

## 2012-03-21 DIAGNOSIS — C50919 Malignant neoplasm of unspecified site of unspecified female breast: Secondary | ICD-10-CM

## 2012-03-21 DIAGNOSIS — E049 Nontoxic goiter, unspecified: Secondary | ICD-10-CM

## 2012-03-21 DIAGNOSIS — K3189 Other diseases of stomach and duodenum: Secondary | ICD-10-CM

## 2012-03-21 DIAGNOSIS — Z170 Estrogen receptor positive status [ER+]: Secondary | ICD-10-CM

## 2012-03-21 DIAGNOSIS — M8580 Other specified disorders of bone density and structure, unspecified site: Secondary | ICD-10-CM

## 2012-03-21 DIAGNOSIS — E785 Hyperlipidemia, unspecified: Secondary | ICD-10-CM

## 2012-03-21 DIAGNOSIS — R221 Localized swelling, mass and lump, neck: Secondary | ICD-10-CM

## 2012-03-21 DIAGNOSIS — D649 Anemia, unspecified: Secondary | ICD-10-CM

## 2012-03-21 DIAGNOSIS — I951 Orthostatic hypotension: Secondary | ICD-10-CM

## 2012-03-21 LAB — CBC WITH DIFFERENTIAL/PLATELET
BASO%: 0.8 % (ref 0.0–2.0)
Basophils Absolute: 0 10*3/uL (ref 0.0–0.1)
EOS%: 6.7 % (ref 0.0–7.0)
Eosinophils Absolute: 0.4 10*3/uL (ref 0.0–0.5)
HCT: 36.6 % (ref 34.8–46.6)
HGB: 12.4 g/dL (ref 11.6–15.9)
LYMPH%: 37.7 % (ref 14.0–49.7)
MCH: 33.5 pg (ref 25.1–34.0)
MCHC: 33.8 g/dL (ref 31.5–36.0)
MCV: 99.1 fL (ref 79.5–101.0)
MONO#: 0.3 10*3/uL (ref 0.1–0.9)
MONO%: 5.5 % (ref 0.0–14.0)
NEUT#: 3.1 10*3/uL (ref 1.5–6.5)
NEUT%: 49.3 % (ref 38.4–76.8)
Platelets: 183 10*3/uL (ref 145–400)
RBC: 3.7 10*6/uL (ref 3.70–5.45)
RDW: 14.3 % (ref 11.2–14.5)
WBC: 6.2 10*3/uL (ref 3.9–10.3)
lymph#: 2.3 10*3/uL (ref 0.9–3.3)

## 2012-03-21 LAB — COMPREHENSIVE METABOLIC PANEL (CC13)
ALT: 19 U/L (ref 0–55)
AST: 26 U/L (ref 5–34)
Albumin: 3.3 g/dL — ABNORMAL LOW (ref 3.5–5.0)
Alkaline Phosphatase: 38 U/L — ABNORMAL LOW (ref 40–150)
BUN: 8.8 mg/dL (ref 7.0–26.0)
CO2: 28 mEq/L (ref 22–29)
Calcium: 8.9 mg/dL (ref 8.4–10.4)
Chloride: 107 mEq/L (ref 98–107)
Creatinine: 0.8 mg/dL (ref 0.6–1.1)
Glucose: 91 mg/dl (ref 70–99)
Potassium: 4.1 mEq/L (ref 3.5–5.1)
Sodium: 142 mEq/L (ref 136–145)
Total Bilirubin: 0.3 mg/dL (ref 0.20–1.20)
Total Protein: 6.9 g/dL (ref 6.4–8.3)

## 2012-03-21 NOTE — Telephone Encounter (Signed)
Former [facility] pt reassigned to [facility] and seen by Ramond Craver today. Per 2/25 pof from [facility] pt has been seen by MM for MGUS in the past and needs a f/u appt with him in a month due to rising values. gv pt appt schedule for March w/MM. lmonvm for MM's desk nurse re above.

## 2012-03-21 NOTE — Progress Notes (Signed)
[facility] OFFICE PROGRESS NOTE  Crawford Givens, MD  DIAGNOSIS: hx of monoclonal gammopathy, hx of node-negative Right breast cancer  PAST THERAPY: 1. Biopsy - 02/01/2010. Invasive ductal ca, ER+, PR+, Her2 non amplifed at 1.03 2. RIGHT lumpectomy Feb 2012 with negative sentinel lymph nodes, invasive ductal with DCIS, 0.9cm, ER 99%, PR 100%, Ki67 - 6%, and HER2 neu 1.03 non amplified 3. Status post completion of radiation 05/06/2010 4. Currently on tamoxifen since April 2012 as well as prolia   CURRENT THERAPY: on Tamoxifen & prolia, will remain on Tamoxifen for total of 5 years through 2017  INTERVAL HISTORY: [name] 72 y.o. female returns for routine follow up of her breast cancer. She is doing fine overall. She expressed concern that she would have to "break in another new doctor" after having been under both Dr. Shirline Frees and Dr. Renelda Loma care. She requested that she be allowed to return to seeing Dr. Shirline Frees rather than Dr. Darnelle Catalan or Dr. Welton Flakes. We certainly are willing to accommodate this request.   MEDICAL HISTORY: Past Medical History  Diagnosis Date  . Esophageal Stricture   . Monoclonal gammopathy   . Anemia     Anemia-NOS / PMH, Dr Arbutus Ped  . Hyperlipidemia   . Vertical diplopia   . MVP (mitral valve prolapse)   . Cancer     stage I right breast  . Skin cancer     basal cell L neck  . Allergy     SEASONAL  . Arthritis   . Cataract     BILATERAL  . Osteoporosis     SURGICAL HISTORY:  Past Surgical History  Procedure Laterality Date  . Total abdominal hysterectomy w/ bilateral salpingoophorectomy  72 yrs old    for pain,prolapse ; G3 P2  . Salpingoophorectomy  2002    for benign growths  . Hemorrhoid surgery  1970's  . Appendectomy  2002  . Cardiac catheterization  2005    neg  . Colonoscopy      negative   . Bone biopsy  11/06/10  . Breast lumpectomy  02/2010    right breast lumpectomy and sentinel node biopsy  . Upper  gastrointestinal endoscopy      MEDICATIONS: Current Outpatient Prescriptions  Medication Sig Dispense Refill  . aspirin 81 MG tablet Take 81 mg by mouth daily.        . Calcium 1500 MG tablet Take 1,500 mg by mouth.        . cholecalciferol (VITAMIN D) 1000 UNITS tablet Take 5,000 Units by mouth daily.        . Denosumab (PROLIA [place]) Inject into the skin every 6 (six) months.      [name] ibuprofen (ADVIL,MOTRIN) 200 MG tablet Take 200 mg by mouth every 6 (six) hours as needed.        . Multiple Vitamin (MULTIVITAMIN) capsule 2 tabs po qd       . nitroGLYCERIN (NITROSTAT) 0.3 MG SL tablet Place 0.3 mg under the tongue every 5 (five) minutes as needed.        . Omega-3 Fatty Acids (OMEGA 3 PO) 1 tab po qd       . tamoxifen (NOLVADEX) 20 MG tablet TAKE ONE TABLET BY MOUTH EVERY DAY  30 tablet  5   No current facility-administered medications for this visit.    ALLERGIES:  is allergic to penicillins.  REVIEW OF SYSTEMS: General: denies unexplained weight loss, fatigue, night sweats, fevers, chills HEENT: denies headaches, blurred  vision, dizziness, loss of balance - has bad vision due for treatment of cataracts Cardiac: denies chest pain, or palpitations, occasionally has some chest pressure that is transient Lungs: denies wheezing, shortness of breath or productive cough Abd: denies nausea, vomiting, constipation, diarrhea, blood in stool or urine -admits to chronic indigestion Extremities: denies numbness, tingling, swelling or pain  The rest of the 14-point review of system was negative.   Filed Vitals:   03/21/12 1346  BP: 117/71  Pulse: 83  Temp: 98.3 F (36.8 C)  Resp: 20   Wt Readings from Last 3 Encounters:  03/21/12 128 lb (58.06 kg)  12/22/11 126 lb 12.8 oz (57.516 kg)  11/29/11 129 lb 9.6 oz (58.786 kg)   ECOG Performance status:   PHYSICAL EXAMINATION: 72 yr old white female who appears her stated age  General:  well-nourished in no acute distress.   Eyes:  no  scleral icterus.   ENT:  There were no oropharyngeal lesions.  Neck was without thyromegaly.  Lymphatics:  Negative cervical, supraclavicular, axillary, or inguinal adenopathy.  Respiratory: lungs were clear bilaterally without wheezing or crackles.  Cardiovascular:  Regular rate and rhythm, S1/S2, without murmur, rub or gallop.  There was no pedal edema.   Breast: no nipple retraction, no nipple discharge, no unusual masses or thickening, negative for any palpable abnormality GI:  abdomen was soft, flat, nontender, nondistended, without organomegaly.  Musculoskeletal:  no spinal tenderness of palpation of vertebral spine.   Skin exam was without echymosis, petichae.   Neuro exam was nonfocal.  Cranial nerves II- XII grossly intact Patient was able to get on and off exam table without assistance.  Gait was normal.  Patient was alerted and oriented.  Attention was good.   Language was appropriate.  Mood was normal without depression.  Speech was not pressured.  Thought content was not tangential.         LABORATORY/RADIOLOGY DATA:  Lab Results  Component Value Date   WBC 5.6 12/16/2011   HGB 12.1 12/16/2011   HCT 35.4 12/16/2011   PLT 180 12/16/2011   GLUCOSE 84 12/16/2011   CHOL 196 07/12/2007   TRIG 70 07/12/2007   HDL 69.2 07/12/2007   LDLCALC 113* 07/12/2007   ALT 17 12/16/2011   AST 27 12/16/2011   NA 142 12/16/2011   K 3.8 12/16/2011   CL 106 12/16/2011   CREATININE 0.8 12/16/2011   BUN 10.0 12/16/2011   CO2 31* 12/16/2011   TSH 6.98* 07/12/2007   HGBA1C 6.0 05/11/2006     ASSESSMENT AND PLAN: 1. Biopsy - 02/01/2010. Invasive ductal ca, ER+, PR+, Her2 non amplifed at 1.03 2. RIGHT lumpectomy Feb 2012 with negative sentinel lymph nodes, invasive ductal with DCIS, 0.9cm, ER 99%, PR 100%, Ki67 - 6%, and HER2 neu 1.03 non amplified 3. Status post completion of radiation 05/06/2010 4. Currently on tamoxifen since April 2012 as well as prolia 5. Hx of MGUS, with gradually rising  serum light chains noted.   Stage I right breast cancer status post lumpectomy, sentinel node biopsy, radiation therapy, and now on tamoxifen. She will continue with annual mammograms and complete 5 years of Tamoxifen in 2017.  Per patient's request her care will be transferred back to Dr. Shirline Frees. Her serum light chains have been slowly rising and we have rechecked them today. It is far more likely she will need treatment for this rather than for her breast cancer. She is tolerating the Tamoxifen without difficulty. This patient's case was discussed  with Dr. Darnelle Catalan who agreed with this plan of care. She will be seen back in 1 month by Dr. Shirline Frees to address her MGUS.        Bobbe Medico, AOCNP, NP-C

## 2012-03-23 ENCOUNTER — Other Ambulatory Visit: Payer: Medicare Other | Admitting: Lab

## 2012-03-23 ENCOUNTER — Ambulatory Visit: Payer: Medicare Other | Admitting: Oncology

## 2012-03-23 LAB — PROTEIN ELECTROPHORESIS, SERUM
Albumin ELP: 56.1 % (ref 55.8–66.1)
Alpha-1-Globulin: 3.9 % (ref 2.9–4.9)
Alpha-2-Globulin: 8 % (ref 7.1–11.8)
Beta 2: 20.5 % — ABNORMAL HIGH (ref 3.2–6.5)
Beta Globulin: 5.6 % (ref 4.7–7.2)
Gamma Globulin: 5.9 % — ABNORMAL LOW (ref 11.1–18.8)
M-Spike, %: 1.23 g/dL
Total Protein, Serum Electrophoresis: 6.7 g/dL (ref 6.0–8.3)

## 2012-03-23 LAB — KAPPA/LAMBDA LIGHT CHAINS
Kappa free light chain: 8.46 mg/dL — ABNORMAL HIGH (ref 0.33–1.94)
Kappa:Lambda Ratio: 10.85 — ABNORMAL HIGH (ref 0.26–1.65)
Lambda Free Lght Chn: 0.78 mg/dL (ref 0.57–2.63)

## 2012-04-17 ENCOUNTER — Telehealth: Payer: Self-pay | Admitting: Internal Medicine

## 2012-04-18 ENCOUNTER — Other Ambulatory Visit: Payer: Medicare Other | Admitting: Lab

## 2012-04-18 ENCOUNTER — Ambulatory Visit: Payer: Medicare Other | Admitting: Internal Medicine

## 2012-04-20 ENCOUNTER — Ambulatory Visit
Admission: RE | Admit: 2012-04-20 | Discharge: 2012-04-20 | Disposition: A | Discharge status: Home / Self Care | Payer: Medicare Other | Source: Ambulatory Visit | Attending: Oncology | Admitting: Oncology

## 2012-04-20 ENCOUNTER — Telehealth: Payer: Self-pay | Admitting: Internal Medicine

## 2012-04-20 ENCOUNTER — Ambulatory Visit (HOSPITAL_BASED_OUTPATIENT_CLINIC_OR_DEPARTMENT_OTHER): Payer: Medicare Other | Admitting: Internal Medicine

## 2012-04-20 ENCOUNTER — Encounter: Payer: Self-pay | Admitting: Internal Medicine

## 2012-04-20 ENCOUNTER — Other Ambulatory Visit: Payer: Medicare Other | Admitting: Lab

## 2012-04-20 VITALS — BP 113/68 | HR 77 | Temp 97.6°F | Resp 20 | Ht 65.0 in | Wt 121.5 lb

## 2012-04-20 DIAGNOSIS — D472 Monoclonal gammopathy: Secondary | ICD-10-CM

## 2012-04-20 DIAGNOSIS — R5381 Other malaise: Secondary | ICD-10-CM

## 2012-04-20 DIAGNOSIS — I951 Orthostatic hypotension: Secondary | ICD-10-CM

## 2012-04-20 DIAGNOSIS — C50919 Malignant neoplasm of unspecified site of unspecified female breast: Secondary | ICD-10-CM

## 2012-04-20 DIAGNOSIS — M899 Disorder of bone, unspecified: Secondary | ICD-10-CM | POA: Diagnosis not present

## 2012-04-20 DIAGNOSIS — R221 Localized swelling, mass and lump, neck: Secondary | ICD-10-CM

## 2012-04-20 DIAGNOSIS — M810 Age-related osteoporosis without current pathological fracture: Secondary | ICD-10-CM | POA: Diagnosis not present

## 2012-04-20 DIAGNOSIS — Z170 Estrogen receptor positive status [ER+]: Secondary | ICD-10-CM | POA: Diagnosis not present

## 2012-04-20 DIAGNOSIS — K3189 Other diseases of stomach and duodenum: Secondary | ICD-10-CM

## 2012-04-20 DIAGNOSIS — E049 Nontoxic goiter, unspecified: Secondary | ICD-10-CM

## 2012-04-20 DIAGNOSIS — M8580 Other specified disorders of bone density and structure, unspecified site: Secondary | ICD-10-CM

## 2012-04-20 DIAGNOSIS — M949 Disorder of cartilage, unspecified: Secondary | ICD-10-CM | POA: Diagnosis not present

## 2012-04-20 DIAGNOSIS — E785 Hyperlipidemia, unspecified: Secondary | ICD-10-CM

## 2012-04-20 DIAGNOSIS — C50911 Malignant neoplasm of unspecified site of right female breast: Secondary | ICD-10-CM

## 2012-04-20 DIAGNOSIS — R1310 Dysphagia, unspecified: Secondary | ICD-10-CM

## 2012-04-20 DIAGNOSIS — D649 Anemia, unspecified: Secondary | ICD-10-CM

## 2012-04-20 NOTE — Progress Notes (Signed)
[facility] [phone]   [phone]  OFFICE PROGRESS NOTE  Crawford Givens, MD [address] [place] [address]  DIAGNOSIS:  1) Node-negative breast cancer status post completion of radiation 05/06/2010 currently on tamoxifen as well as prolia. 2) Monoclonal gammopathy of unknown significance on observation since 2007  PRIOR THERAPY: 1) status post right lumpectomy on 03/05/2010 and it showed invasive ductal carcinoma 0.9 CM, Positive for ER/PR and negative for HER-2, with ductal carcinoma in situ present and negative sentinel lymph node biopsies. 2) status post adjuvant radiotherapy completed in April of 2012  CURRENT THERAPY: 1) tamoxifen 20 mg by mouth daily started in 2012. 2) Prolia subcutaneous injection every 6 months  INTERVAL HISTORY: [name] 72 y.o. female returns to the clinic today for followup visit and to reestablish care with me after Dr. Donnie Coffin left the practice. The patient is feeling fine today with no specific complaints. She denied having any significant fatigue or weakness. She has no chest pain, shortness of breath, cough or hemoptysis. She had repeat myeloma panel performed recently and she is here for evaluation and discussion of her lab results. She is tolerating her treatment with tamoxifen fairly well.   MEDICAL HISTORY: Past Medical History  Diagnosis Date  . Esophageal Stricture   . Monoclonal gammopathy   . Anemia     Anemia-NOS / PMH, Dr Arbutus Ped  . Hyperlipidemia   . Vertical diplopia   . MVP (mitral valve prolapse)   . Cancer     stage I right breast  . Skin cancer     basal cell L neck  . Allergy     SEASONAL  . Arthritis   . Cataract     BILATERAL  . Osteoporosis     ALLERGIES:  is allergic to penicillins.  MEDICATIONS:  Current Outpatient Prescriptions  Medication Sig Dispense Refill  . aspirin 81 MG tablet Take 81 mg by mouth daily.        . Calcium 1500 MG tablet Take  1,500 mg by mouth.        . cholecalciferol (VITAMIN D) 1000 UNITS tablet Take 5,000 Units by mouth daily.        . Denosumab (PROLIA [place]) Inject into the skin every 6 (six) months.      [name] ibuprofen (ADVIL,MOTRIN) 200 MG tablet Take 200 mg by mouth every 6 (six) hours as needed.        . Multiple Vitamin (MULTIVITAMIN) capsule 2 tabs po qd       . nitroGLYCERIN (NITROSTAT) 0.3 MG SL tablet Place 0.3 mg under the tongue every 5 (five) minutes as needed.        . Omega-3 Fatty Acids (OMEGA 3 PO) 1 tab po qd       . tamoxifen (NOLVADEX) 20 MG tablet TAKE ONE TABLET BY MOUTH EVERY DAY  30 tablet  5   No current facility-administered medications for this visit.    SURGICAL HISTORY:  Past Surgical History  Procedure Laterality Date  . Total abdominal hysterectomy w/ bilateral salpingoophorectomy  72 yrs old    for pain,prolapse ; G3 P2  . Salpingoophorectomy  2002    for benign growths  . Hemorrhoid surgery  1970's  . Appendectomy  2002  . Cardiac catheterization  2005    neg  . Colonoscopy      negative   . Bone biopsy  11/06/10  . Breast lumpectomy  02/2010  right breast lumpectomy and sentinel node biopsy  . Upper gastrointestinal endoscopy      REVIEW OF SYSTEMS:  A comprehensive review of systems was negative.   PHYSICAL EXAMINATION: General appearance: alert, cooperative and no distress Head: Normocephalic, without obvious abnormality, atraumatic Neck: no adenopathy Resp: clear to auscultation bilaterally Cardio: regular rate and rhythm, S1, S2 normal, no murmur, click, rub or gallop GI: soft, non-tender; bowel sounds normal; no masses,  no organomegaly Extremities: extremities normal, atraumatic, no cyanosis or edema  ECOG PERFORMANCE STATUS: 1 - Symptomatic but completely ambulatory  Blood pressure 113/68, pulse 77, temperature 97.6 F (36.4 C), temperature source Oral, resp. rate 20, height 5\' 5"  (1.651 m), weight 121 lb 8 oz (55.112 kg).  LABORATORY DATA: Lab  Results  Component Value Date   WBC 6.2 03/21/2012   HGB 12.4 03/21/2012   HCT 36.6 03/21/2012   MCV 99.1 03/21/2012   PLT 183 03/21/2012      Chemistry      Component Value Date/Time   NA 142 03/21/2012 1501   NA 142 06/15/2011 1327   K 4.1 03/21/2012 1501   K 4.2 06/15/2011 1327   CL 107 03/21/2012 1501   CL 108 06/15/2011 1327   CO2 28 03/21/2012 1501   CO2 26 06/15/2011 1327   BUN 8.8 03/21/2012 1501   BUN 9 06/15/2011 1327   CREATININE 0.8 03/21/2012 1501   CREATININE 0.85 06/15/2011 1327      Component Value Date/Time   CALCIUM 8.9 03/21/2012 1501   CALCIUM 8.9 06/15/2011 1327   ALKPHOS 38* 03/21/2012 1501   ALKPHOS 44 06/15/2011 1327   AST 26 03/21/2012 1501   AST 25 06/15/2011 1327   ALT 19 03/21/2012 1501   ALT 14 06/15/2011 1327   BILITOT 0.30 03/21/2012 1501   BILITOT 0.3 06/15/2011 1327       RADIOGRAPHIC STUDIES: No results found.  ASSESSMENT: This is a very pleasant 72 years old white female with history of MGUS in addition to the right breast carcinoma currently on tamoxifen. She is doing fine and her lab results showed no evidence for disease progression.   PLAN: I discussed the lab result with the patient today. I recommended for her to continue on observation for the MGUS in addition to tamoxifen 20 mg by mouth daily for the history of right breast carcinoma. She'll also continue on Prolia every 6 months for the osteoporosis. She will come back for followup visit in 6 months with repeat myeloma panel in addition to CA 27.29. She was advised to call immediately if she has any concerning symptoms in the interval.  All questions were answered. The patient knows to call the clinic with any problems, questions or concerns. We can certainly see the patient much sooner if necessary.  I spent 15 minutes counseling the patient face to face. The total time spent in the appointment was 25 minutes.

## 2012-04-20 NOTE — Telephone Encounter (Signed)
Gave appt for September 2014 lab and MD

## 2012-04-21 ENCOUNTER — Other Ambulatory Visit: Payer: Self-pay | Admitting: Oncology

## 2012-04-21 ENCOUNTER — Ambulatory Visit: Payer: Medicare Other | Admitting: Family Medicine

## 2012-04-22 NOTE — Patient Instructions (Signed)
No evidence for disease progression on his recent bloodwork. Followup in 6 months with repeat myeloma

## 2012-04-23 ENCOUNTER — Other Ambulatory Visit: Payer: Self-pay | Admitting: Family Medicine

## 2012-04-23 DIAGNOSIS — E785 Hyperlipidemia, unspecified: Secondary | ICD-10-CM

## 2012-04-23 DIAGNOSIS — E049 Nontoxic goiter, unspecified: Secondary | ICD-10-CM

## 2012-04-24 ENCOUNTER — Other Ambulatory Visit (INDEPENDENT_AMBULATORY_CARE_PROVIDER_SITE_OTHER): Payer: Medicare Other

## 2012-04-24 DIAGNOSIS — E049 Nontoxic goiter, unspecified: Secondary | ICD-10-CM | POA: Diagnosis not present

## 2012-04-24 DIAGNOSIS — E785 Hyperlipidemia, unspecified: Secondary | ICD-10-CM | POA: Diagnosis not present

## 2012-04-24 LAB — TSH: TSH: 2.98 u[IU]/mL (ref 0.35–5.50)

## 2012-04-24 LAB — LIPID PANEL
Cholesterol: 144 mg/dL (ref 0–200)
HDL: 65.9 mg/dL (ref >39.00)
LDL Cholesterol: 71 mg/dL (ref 0–99)
Total CHOL/HDL Ratio: 2
Triglycerides: 34 mg/dL (ref 0.0–149.0)
VLDL: 6.8 mg/dL (ref 0.0–40.0)

## 2012-04-26 DIAGNOSIS — H532 Diplopia: Secondary | ICD-10-CM | POA: Diagnosis not present

## 2012-04-26 DIAGNOSIS — H43399 Other vitreous opacities, unspecified eye: Secondary | ICD-10-CM | POA: Diagnosis not present

## 2012-04-26 DIAGNOSIS — H2510 Age-related nuclear cataract, unspecified eye: Secondary | ICD-10-CM | POA: Diagnosis not present

## 2012-04-26 DIAGNOSIS — H53009 Unspecified amblyopia, unspecified eye: Secondary | ICD-10-CM | POA: Diagnosis not present

## 2012-05-01 ENCOUNTER — Ambulatory Visit (INDEPENDENT_AMBULATORY_CARE_PROVIDER_SITE_OTHER): Payer: Medicare Other | Admitting: Family Medicine

## 2012-05-01 ENCOUNTER — Encounter: Payer: Self-pay | Admitting: Family Medicine

## 2012-05-01 VITALS — BP 122/68 | HR 85 | Temp 98.3°F | Ht 65.0 in | Wt 123.8 lb

## 2012-05-01 DIAGNOSIS — M542 Cervicalgia: Secondary | ICD-10-CM | POA: Diagnosis not present

## 2012-05-01 DIAGNOSIS — Z1331 Encounter for screening for depression: Secondary | ICD-10-CM

## 2012-05-01 DIAGNOSIS — Z0000 Encounter for general adult medical examination without abnormal findings: Secondary | ICD-10-CM | POA: Diagnosis not present

## 2012-05-01 DIAGNOSIS — D472 Monoclonal gammopathy: Secondary | ICD-10-CM

## 2012-05-01 DIAGNOSIS — E049 Nontoxic goiter, unspecified: Secondary | ICD-10-CM

## 2012-05-01 DIAGNOSIS — C50919 Malignant neoplasm of unspecified site of unspecified female breast: Secondary | ICD-10-CM

## 2012-05-01 MED ORDER — TRAMADOL HCL 50 MG PO TABS: 50.0000 mg | ORAL_TABLET | Freq: Every evening | ORAL | Status: DC | PRN
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
## 2012-05-01 NOTE — Patient Instructions (Signed)
Take the tramadol for pain as needed at night and use a heating pad.  Take care.  Glad to see you.

## 2012-05-02 DIAGNOSIS — M542 Cervicalgia: Secondary | ICD-10-CM | POA: Insufficient documentation

## 2012-05-02 DIAGNOSIS — Z0000 Encounter for general adult medical examination without abnormal findings: Secondary | ICD-10-CM | POA: Insufficient documentation

## 2012-05-02 NOTE — Assessment & Plan Note (Signed)
tsh wnl

## 2012-05-02 NOTE — Assessment & Plan Note (Signed)
Longstanding, no need to image today.  Would restart tramadol and stretch, use heat. Fu prn. She agrees.  No weakness in arms, no change in sensation in arms.

## 2012-05-02 NOTE — Assessment & Plan Note (Signed)
Per onc clinic.  

## 2012-05-02 NOTE — Assessment & Plan Note (Signed)
See scanned forms.  Routine anticipatory guidance given to patient.  See health maintenance. Flu encouraged Shingles can be done if okayed by oncology   PNA 2011 Tetanus 2010 Colon 2011 Breast cancer screening up to date Advance directive d/w pt. Would have husband [name] designated if incapacitated.  Cognitive function addressed- see scanned forms- and if abnormal then additional documentation follows.  Cholesterol levels are normal and TSH wnl.

## 2012-05-02 NOTE — Assessment & Plan Note (Signed)
Per onc clinic.

## 2012-06-27 ENCOUNTER — Other Ambulatory Visit: Payer: Self-pay | Admitting: *Deleted

## 2012-06-28 ENCOUNTER — Telehealth: Payer: Self-pay | Admitting: Internal Medicine

## 2012-06-28 NOTE — Telephone Encounter (Signed)
[name] and sched inj for 6.5.14.Marland KitchenMarland Kitchenpt ok and aware

## 2012-06-29 ENCOUNTER — Other Ambulatory Visit: Payer: Self-pay | Admitting: Internal Medicine

## 2012-06-29 ENCOUNTER — Ambulatory Visit (HOSPITAL_BASED_OUTPATIENT_CLINIC_OR_DEPARTMENT_OTHER): Payer: Medicare Other

## 2012-06-29 VITALS — BP 123/73 | HR 78 | Temp 98.0°F

## 2012-06-29 DIAGNOSIS — M810 Age-related osteoporosis without current pathological fracture: Secondary | ICD-10-CM | POA: Diagnosis not present

## 2012-06-29 DIAGNOSIS — C50919 Malignant neoplasm of unspecified site of unspecified female breast: Secondary | ICD-10-CM

## 2012-06-29 MED ORDER — DENOSUMAB 60 MG/ML ~~LOC~~ SOLN
60.0000 mg | Freq: Once | SUBCUTANEOUS | Status: AC
  Administered 2012-06-29: 60 mg via SUBCUTANEOUS
  Filled 2012-06-29: qty 1

## 2012-10-11 ENCOUNTER — Other Ambulatory Visit: Payer: Self-pay | Admitting: *Deleted

## 2012-10-11 MED ORDER — TRAMADOL HCL 50 MG PO TABS: 50.0000 mg | ORAL_TABLET | Freq: Every evening | ORAL | Status: DC | PRN

## 2012-10-11 NOTE — Telephone Encounter (Signed)
Last filled 08/31/12

## 2012-10-11 NOTE — Telephone Encounter (Signed)
Please call in

## 2012-10-12 NOTE — Telephone Encounter (Signed)
Medication phoned to pharmacy.  

## 2012-10-20 ENCOUNTER — Other Ambulatory Visit (HOSPITAL_BASED_OUTPATIENT_CLINIC_OR_DEPARTMENT_OTHER): Payer: Medicare Other

## 2012-10-20 DIAGNOSIS — C50911 Malignant neoplasm of unspecified site of right female breast: Secondary | ICD-10-CM

## 2012-10-20 DIAGNOSIS — D472 Monoclonal gammopathy: Secondary | ICD-10-CM | POA: Diagnosis not present

## 2012-10-20 DIAGNOSIS — C50919 Malignant neoplasm of unspecified site of unspecified female breast: Secondary | ICD-10-CM | POA: Diagnosis not present

## 2012-10-20 LAB — CBC WITH DIFFERENTIAL/PLATELET
BASO%: 0.7 % (ref 0.0–2.0)
Basophils Absolute: 0.1 10*3/uL (ref 0.0–0.1)
EOS%: 3.3 % (ref 0.0–7.0)
Eosinophils Absolute: 0.2 10*3/uL (ref 0.0–0.5)
HCT: 38.4 % (ref 34.8–46.6)
HGB: 13 g/dL (ref 11.6–15.9)
LYMPH%: 20.5 % (ref 14.0–49.7)
MCH: 33.2 pg (ref 25.1–34.0)
MCHC: 33.9 g/dL (ref 31.5–36.0)
MCV: 97.9 fL (ref 79.5–101.0)
MONO#: 0.3 10*3/uL (ref 0.1–0.9)
MONO%: 3.8 % (ref 0.0–14.0)
NEUT#: 5.3 10*3/uL (ref 1.5–6.5)
NEUT%: 71.7 % (ref 38.4–76.8)
Platelets: 195 10*3/uL (ref 145–400)
RBC: 3.92 10*6/uL (ref 3.70–5.45)
RDW: 13.5 % (ref 11.2–14.5)
WBC: 7.4 10*3/uL (ref 3.9–10.3)
lymph#: 1.5 10*3/uL (ref 0.9–3.3)

## 2012-10-20 LAB — LACTATE DEHYDROGENASE (CC13): LDH: 165 U/L (ref 125–245)

## 2012-10-20 LAB — COMPREHENSIVE METABOLIC PANEL (CC13)
ALT: 18 U/L (ref 0–55)
AST: 27 U/L (ref 5–34)
Albumin: 3.4 g/dL — ABNORMAL LOW (ref 3.5–5.0)
Alkaline Phosphatase: 47 U/L (ref 40–150)
BUN: 8.9 mg/dL (ref 7.0–26.0)
CO2: 23 mEq/L (ref 22–29)
Calcium: 9.1 mg/dL (ref 8.4–10.4)
Chloride: 110 mEq/L — ABNORMAL HIGH (ref 98–109)
Creatinine: 0.8 mg/dL (ref 0.6–1.1)
Glucose: 108 mg/dl (ref 70–140)
Potassium: 3.9 mEq/L (ref 3.5–5.1)
Sodium: 144 mEq/L (ref 136–145)
Total Bilirubin: 0.6 mg/dL (ref 0.20–1.20)
Total Protein: 7.5 g/dL (ref 6.4–8.3)

## 2012-10-23 ENCOUNTER — Ambulatory Visit (HOSPITAL_BASED_OUTPATIENT_CLINIC_OR_DEPARTMENT_OTHER): Payer: Medicare Other | Admitting: Internal Medicine

## 2012-10-23 ENCOUNTER — Telehealth: Payer: Self-pay | Admitting: Internal Medicine

## 2012-10-23 ENCOUNTER — Encounter: Payer: Self-pay | Admitting: Internal Medicine

## 2012-10-23 ENCOUNTER — Other Ambulatory Visit: Payer: Medicare Other | Admitting: Lab

## 2012-10-23 VITALS — BP 134/65 | HR 86 | Temp 98.2°F | Resp 20 | Ht 65.0 in | Wt 128.4 lb

## 2012-10-23 DIAGNOSIS — D472 Monoclonal gammopathy: Secondary | ICD-10-CM | POA: Diagnosis not present

## 2012-10-23 DIAGNOSIS — C50911 Malignant neoplasm of unspecified site of right female breast: Secondary | ICD-10-CM

## 2012-10-23 DIAGNOSIS — C50919 Malignant neoplasm of unspecified site of unspecified female breast: Secondary | ICD-10-CM

## 2012-10-23 LAB — IGG, IGA, IGM
IgA: 39 mg/dL — ABNORMAL LOW (ref 69–380)
IgG (Immunoglobin G), Serum: 1690 mg/dL (ref 690–1700)
IgM, Serum: 29 mg/dL — ABNORMAL LOW (ref 52–322)

## 2012-10-23 LAB — CANCER ANTIGEN 27.29: CA 27.29: 22 U/mL (ref 0–39)

## 2012-10-23 LAB — KAPPA/LAMBDA LIGHT CHAINS
Kappa free light chain: 11.5 mg/dL — ABNORMAL HIGH (ref 0.33–1.94)
Kappa:Lambda Ratio: 13.37 — ABNORMAL HIGH (ref 0.26–1.65)
Lambda Free Lght Chn: 0.86 mg/dL (ref 0.57–2.63)

## 2012-10-23 LAB — BETA 2 MICROGLOBULIN, SERUM: Beta-2 Microglobulin: 1.73 mg/L (ref 1.01–1.73)

## 2012-10-23 NOTE — Patient Instructions (Signed)
--   Follow up in 6 months with repeat blood work

## 2012-10-23 NOTE — Progress Notes (Signed)
[facility] [phone]   [phone]  OFFICE PROGRESS NOTE  Crawford Givens, MD [address] [place] [address]  DIAGNOSIS:  1) Node-negative breast cancer status post completion of radiation 05/06/2010 currently on tamoxifen as well as prolia.  2) Monoclonal gammopathy of unknown significance on observation since 2007   PRIOR THERAPY:  1) status post right lumpectomy on 03/05/2010 and it showed invasive ductal carcinoma 0.9 CM, Positive for ER/PR and negative for HER-2, with ductal carcinoma in situ present and negative sentinel lymph node biopsies.  2) status post adjuvant radiotherapy completed in April of 2012   CURRENT THERAPY:  1) tamoxifen 20 mg by mouth daily started in 2012.  2) Prolia subcutaneous injection every 6 months  INTERVAL HISTORY: [name] 72 y.o. female returns to the clinic today for three-month followup visit. The patient is feeling fine today with no specific complaints. She had repeat myeloma panel performed recently and she is here for evaluation and discussion of her lab results. She denied having any significant weight loss or night sweats. The patient has no nausea or vomiting. She denied having any significant chest pain, shortness breath, cough or hemoptysis. She is tolerating her treatment with tamoxifen fairly well.  MEDICAL HISTORY: Past Medical History  Diagnosis Date  . Esophageal Stricture   . Monoclonal gammopathy   . Anemia     Anemia-NOS / PMH, Dr Arbutus Ped  . Hyperlipidemia   . Vertical diplopia   . MVP (mitral valve prolapse)   . Cancer     stage I right breast  . Skin cancer     basal cell L neck  . Allergy     SEASONAL  . Arthritis   . Cataract     BILATERAL  . Osteoporosis     ALLERGIES:  is allergic to penicillins.  MEDICATIONS:  Current Outpatient Prescriptions  Medication Sig Dispense Refill  . aspirin 81 MG tablet Take 81 mg by mouth daily.        [name] b complex  vitamins tablet Take 1 tablet by mouth daily.      . Calcium 1500 MG tablet Take 1,500 mg by mouth.        . cholecalciferol (VITAMIN D) 1000 UNITS tablet Take 5,000 Units by mouth daily.        . Denosumab (PROLIA [place]) Inject into the skin every 6 (six) months.      . Multiple Vitamin (MULTIVITAMIN) capsule 2 tabs po qd       . Omega-3 Fatty Acids (OMEGA 3 PO) 1 tab po qd       . tamoxifen (NOLVADEX) 20 MG tablet TAKE ONE TABLET BY MOUTH EVERY DAY  30 tablet  5  . traMADol (ULTRAM) 50 MG tablet Take 1-2 tablets (50-100 mg total) by mouth at bedtime as needed for pain.  60 tablet  5  . vitamin B-12 (CYANOCOBALAMIN) 500 MCG tablet Take 2500 mcg daily.      [name] ibuprofen (ADVIL,MOTRIN) 200 MG tablet Take 200 mg by mouth every 6 (six) hours as needed.        . nitroGLYCERIN (NITROSTAT) 0.3 MG SL tablet Place 0.3 mg under the tongue every 5 (five) minutes as needed.         No current facility-administered medications for this visit.    SURGICAL HISTORY:  Past Surgical History  Procedure Laterality Date  . Total abdominal hysterectomy w/ bilateral salpingoophorectomy  72 yrs old  for pain,prolapse ; G3 P2  . Salpingoophorectomy  2002    for benign growths  . Hemorrhoid surgery  1970's  . Appendectomy  2002  . Cardiac catheterization  2005    neg  . Colonoscopy      negative   . Bone biopsy  11/06/10  . Breast lumpectomy  02/2010    right breast lumpectomy and sentinel node biopsy  . Upper gastrointestinal endoscopy      REVIEW OF SYSTEMS:  A comprehensive review of systems was negative.   PHYSICAL EXAMINATION: General appearance: alert, cooperative and no distress Head: Normocephalic, without obvious abnormality, atraumatic Neck: no adenopathy, no JVD, supple, symmetrical, trachea midline and thyroid not enlarged, symmetric, no tenderness/mass/nodules Lymph nodes: Cervical, supraclavicular, and axillary nodes normal. Resp: clear to auscultation bilaterally Cardio: regular rate and  rhythm, S1, S2 normal, no murmur, click, rub or gallop GI: soft, non-tender; bowel sounds normal; no masses,  no organomegaly Extremities: extremities normal, atraumatic, no cyanosis or edema  ECOG PERFORMANCE STATUS: 0 - Asymptomatic  Blood pressure 134/65, pulse 86, temperature 98.2 F (36.8 C), temperature source Oral, resp. rate 20, height 5\' 5"  (1.651 m), weight 128 lb 6.4 oz (58.242 kg).  LABORATORY DATA: Lab Results  Component Value Date   WBC 7.4 10/20/2012   HGB 13.0 10/20/2012   HCT 38.4 10/20/2012   MCV 97.9 10/20/2012   PLT 195 10/20/2012      Chemistry      Component Value Date/Time   NA 144 10/20/2012 0936   NA 142 06/15/2011 1327   K 3.9 10/20/2012 0936   K 4.2 06/15/2011 1327   CL 107 03/21/2012 1501   CL 108 06/15/2011 1327   CO2 23 10/20/2012 0936   CO2 26 06/15/2011 1327   BUN 8.9 10/20/2012 0936   BUN 9 06/15/2011 1327   CREATININE 0.8 10/20/2012 0936   CREATININE 0.85 06/15/2011 1327      Component Value Date/Time   CALCIUM 9.1 10/20/2012 0936   CALCIUM 8.9 06/15/2011 1327   ALKPHOS 47 10/20/2012 0936   ALKPHOS 44 06/15/2011 1327   AST 27 10/20/2012 0936   AST 25 06/15/2011 1327   ALT 18 10/20/2012 0936   ALT 14 06/15/2011 1327   BILITOT 0.60 10/20/2012 0936   BILITOT 0.3 06/15/2011 1327       RADIOGRAPHIC STUDIES: No results found.  ASSESSMENT AND PLAN: This is a very pleasant 72 years old white female with history of monoclonal gammopathy of undetermined significance and has been observation since 2007 in addition to diagnosis of node-negative breast cancer in 2012 and currently on treatment with tamoxifen as well as Prolia. The patient is doing fine and she has no evidence for disease progression on his recent bloodwork. I recommended for her to continue on observation for the MGUS in addition to tamoxifen and Prolia for the breast cancer. She would come back for followup visit in 6 months with repeat myeloma panel as well as CA 27.29 and chest x-ray. She was  advised to call immediately if she has any concerning symptoms in the interval.  The patient voices understanding of current disease status and treatment options and is in agreement with the current care plan.  All questions were answered. The patient knows to call the clinic with any problems, questions or concerns. We can certainly see the patient much sooner if necessary.

## 2012-10-23 NOTE — Telephone Encounter (Signed)
gv and printed appt sched and avs for pt for Dec and March 2015  °

## 2012-10-24 ENCOUNTER — Telehealth: Payer: Self-pay

## 2012-10-24 MED ORDER — ZOSTER VACCINE LIVE 19400 UNT/0.65ML ~~LOC~~ SOLR: 0.6500 mL | Freq: Once | SUBCUTANEOUS | Status: DC

## 2012-10-24 NOTE — Telephone Encounter (Signed)
Let me get input from Dr. Arbutus Ped and we'll go from there.  I didn't print the rx for now.  Thanks.

## 2012-10-24 NOTE — Telephone Encounter (Signed)
Patient advised.  Rx left at front desk for pick up. 

## 2012-10-24 NOTE — Telephone Encounter (Signed)
Discussed with Dr. Arbutus Ped.  Okay to proceed with zostavax.  Printed.

## 2012-10-24 NOTE — Telephone Encounter (Signed)
Pt left v/m requesting shingles vaccine order printed for pt to pick up;  Pt wants to get at [facility]. Pt advised she has not gotten permission from oncologist to get shingles vaccine because pt thought Dr Para March would discuss directly with Dr Shirline Frees if OK for pt to get shingles vaccine.(When pt saw Dr Shirline Frees she mentioned the shingles shot to Dr Shirline Frees but did not get confirmation OK to take vaccine.Please advise.

## 2012-10-31 ENCOUNTER — Ambulatory Visit (INDEPENDENT_AMBULATORY_CARE_PROVIDER_SITE_OTHER): Payer: Medicare Other

## 2012-10-31 DIAGNOSIS — Z23 Encounter for immunization: Secondary | ICD-10-CM

## 2012-12-01 ENCOUNTER — Ambulatory Visit (INDEPENDENT_AMBULATORY_CARE_PROVIDER_SITE_OTHER): Payer: Medicare Other | Admitting: General Surgery

## 2012-12-01 ENCOUNTER — Encounter (INDEPENDENT_AMBULATORY_CARE_PROVIDER_SITE_OTHER): Payer: Self-pay | Admitting: General Surgery

## 2012-12-01 VITALS — BP 138/80 | HR 78 | Temp 97.5°F | Resp 18 | Ht 64.0 in | Wt 122.0 lb

## 2012-12-01 DIAGNOSIS — Z853 Personal history of malignant neoplasm of breast: Secondary | ICD-10-CM

## 2012-12-01 NOTE — Progress Notes (Signed)
Subjective:     Patient ID: [name], female   DOB: 01-Oct-1940, 72 y.o.   MRN: [id number]  HPI This is a 72 year old female who had a stage I right breast cancer that she underwent a lumpectomy and a right axillary sentinel lymph node biopsy. This was hormone receptor positive. She underwent radiation therapy and has since been on tamoxifen. She's been tolerating her tamoxifen well. She has no real complaints referable to her breast right now. She had a BI-RADS 2 mammogram last December is due to get her next mammogram in December of 2014. She comes back in today for her annual exam. She has no symptoms referable to her right arm in terms of any lymphedema. She has some soreness at her surgical sites but is able to range this arm very well.   Review of Systems     Objective:   Physical Exam  Constitutional: She appears well-developed.  Pulmonary/Chest: Right breast exhibits no inverted nipple, no mass, no nipple discharge, no skin change and no tenderness. Left breast exhibits no inverted nipple, no mass, no nipple discharge, no skin change and no tenderness.    Lymphadenopathy:    She has no cervical adenopathy.    She has no axillary adenopathy.       Right: No supraclavicular adenopathy present.       Left: No supraclavicular adenopathy present.       Assessment:     History stage I right breast cancer     Plan:     She has no clinical evidence of recurrence. She is going to continue her tamoxifen. She will get her mammogram in December as scheduled. She is going to continue her own self exams. She will follow with medical oncology as planned I will see her back in one year for another exam.

## 2012-12-06 ENCOUNTER — Encounter (INDEPENDENT_AMBULATORY_CARE_PROVIDER_SITE_OTHER): Payer: Self-pay

## 2012-12-29 ENCOUNTER — Ambulatory Visit (HOSPITAL_BASED_OUTPATIENT_CLINIC_OR_DEPARTMENT_OTHER): Payer: Medicare Other

## 2012-12-29 VITALS — BP 116/61 | HR 79 | Temp 98.3°F

## 2012-12-29 DIAGNOSIS — M810 Age-related osteoporosis without current pathological fracture: Secondary | ICD-10-CM

## 2012-12-29 DIAGNOSIS — C50919 Malignant neoplasm of unspecified site of unspecified female breast: Secondary | ICD-10-CM

## 2012-12-29 MED ORDER — DENOSUMAB 60 MG/ML ~~LOC~~ SOLN
60.0000 mg | Freq: Once | SUBCUTANEOUS | Status: AC
  Administered 2012-12-29: 60 mg via SUBCUTANEOUS
  Filled 2012-12-29: qty 1

## 2012-12-29 NOTE — Patient Instructions (Signed)
Denosumab injection  What is this medicine?  DENOSUMAB (den oh sue mab) slows bone breakdown. Prolia is used to treat osteoporosis in women after menopause and in men. Xgeva is used to prevent bone fractures and other bone problems caused by cancer bone metastases. Xgeva is also used to treat giant cell tumor of the bone.  This medicine may be used for other purposes; ask your health care provider or pharmacist if you have questions.  COMMON BRAND NAME(S): Prolia, XGEVA  What should I tell my health care provider before I take this medicine?  They need to know if you have any of these conditions:  -dental disease  -eczema  -infection or history of infections  -kidney disease or on dialysis  -low blood calcium or vitamin D  -malabsorption syndrome  -scheduled to have surgery or tooth extraction  -taking medicine that contains denosumab  -thyroid or parathyroid disease  -an unusual reaction to denosumab, other medicines, foods, dyes, or preservatives  -pregnant or trying to get pregnant  -breast-feeding  How should I use this medicine?  This medicine is for injection under the skin. It is given by a health care professional in a hospital or clinic setting.  If you are getting Prolia, a special MedGuide will be given to you by the pharmacist with each prescription and refill. Be sure to read this information carefully each time.  For Prolia, talk to your pediatrician regarding the use of this medicine in children. Special care may be needed. For Xgeva, talk to your pediatrician regarding the use of this medicine in children. While this drug may be prescribed for children as young as 13 years for selected conditions, precautions do apply.  Overdosage: If you think you've taken too much of this medicine contact a poison control center or emergency room at once.  Overdosage: If you think you have taken too much of this medicine contact a poison control center or emergency room at once.  NOTE: This medicine is only for  you. Do not share this medicine with others.  What if I miss a dose?  It is important not to miss your dose. Call your doctor or health care professional if you are unable to keep an appointment.  What may interact with this medicine?  Do not take this medicine with any of the following medications:  -other medicines containing denosumab  This medicine may also interact with the following medications:  -medicines that suppress the immune system  -medicines that treat cancer  -steroid medicines like prednisone or cortisone  This list may not describe all possible interactions. Give your health care provider a list of all the medicines, herbs, non-prescription drugs, or dietary supplements you use. Also tell them if you smoke, drink alcohol, or use illegal drugs. Some items may interact with your medicine.  What should I watch for while using this medicine?  Visit your doctor or health care professional for regular checks on your progress. Your doctor or health care professional may order blood tests and other tests to see how you are doing.  Call your doctor or health care professional if you get a cold or other infection while receiving this medicine. Do not treat yourself. This medicine may decrease your body's ability to fight infection.  You should make sure you get enough calcium and vitamin D while you are taking this medicine, unless your doctor tells you not to. Discuss the foods you eat and the vitamins you take with your health care professional.    See your dentist regularly. Brush and floss your teeth as directed. Before you have any dental work done, tell your dentist you are receiving this medicine.  Do not become pregnant while taking this medicine or for 5 months after stopping it. Women should inform their doctor if they wish to become pregnant or think they might be pregnant. There is a potential for serious side effects to an unborn child. Talk to your health care professional or pharmacist for more  information.  What side effects may I notice from receiving this medicine?  Side effects that you should report to your doctor or health care professional as soon as possible:  -allergic reactions like skin rash, itching or hives, swelling of the face, lips, or tongue  -breathing problems  -chest pain  -fast, irregular heartbeat  -feeling faint or lightheaded, falls  -fever, chills, or any other sign of infection  -muscle spasms, tightening, or twitches  -numbness or tingling  -skin blisters or bumps, or is dry, peels, or red  -slow healing or unexplained pain in the mouth or jaw  -unusual bleeding or bruising  Side effects that usually do not require medical attention (Report these to your doctor or health care professional if they continue or are bothersome.):  -muscle pain  -stomach upset, gas  This list may not describe all possible side effects. Call your doctor for medical advice about side effects. You may report side effects to FDA at 1-800-FDA-1088.  Where should I keep my medicine?  This medicine is only given in a clinic, doctor's office, or other health care setting and will not be stored at home.  NOTE: This sheet is a summary. It may not cover all possible information. If you have questions about this medicine, talk to your doctor, pharmacist, or health care provider.  © 2014, [employer]. (2011-07-12 12:37:47)

## 2013-01-24 ENCOUNTER — Other Ambulatory Visit: Payer: Self-pay | Admitting: Internal Medicine

## 2013-01-24 DIAGNOSIS — Z853 Personal history of malignant neoplasm of breast: Secondary | ICD-10-CM | POA: Diagnosis not present

## 2013-04-19 ENCOUNTER — Other Ambulatory Visit (HOSPITAL_BASED_OUTPATIENT_CLINIC_OR_DEPARTMENT_OTHER): Payer: Medicare Other

## 2013-04-19 ENCOUNTER — Ambulatory Visit (HOSPITAL_COMMUNITY)
Admission: RE | Admit: 2013-04-19 | Discharge: 2013-04-19 | Disposition: A | Discharge status: Home / Self Care | Payer: Medicare Other | Source: Ambulatory Visit | Attending: Internal Medicine | Admitting: Internal Medicine

## 2013-04-19 DIAGNOSIS — I517 Cardiomegaly: Secondary | ICD-10-CM | POA: Diagnosis not present

## 2013-04-19 DIAGNOSIS — C50911 Malignant neoplasm of unspecified site of right female breast: Secondary | ICD-10-CM

## 2013-04-19 DIAGNOSIS — C50919 Malignant neoplasm of unspecified site of unspecified female breast: Secondary | ICD-10-CM

## 2013-04-19 LAB — CBC WITH DIFFERENTIAL/PLATELET
BASO%: 1 % (ref 0.0–2.0)
Basophils Absolute: 0 10*3/uL (ref 0.0–0.1)
EOS%: 5.6 % (ref 0.0–7.0)
Eosinophils Absolute: 0.3 10*3/uL (ref 0.0–0.5)
HCT: 38.3 % (ref 34.8–46.6)
HGB: 12.6 g/dL (ref 11.6–15.9)
LYMPH%: 28.2 % (ref 14.0–49.7)
MCH: 33.1 pg (ref 25.1–34.0)
MCHC: 32.9 g/dL (ref 31.5–36.0)
MCV: 100.6 fL (ref 79.5–101.0)
MONO#: 0.3 10*3/uL (ref 0.1–0.9)
MONO%: 5.6 % (ref 0.0–14.0)
NEUT#: 3 10*3/uL (ref 1.5–6.5)
NEUT%: 59.6 % (ref 38.4–76.8)
Platelets: 190 10*3/uL (ref 145–400)
RBC: 3.8 10*6/uL (ref 3.70–5.45)
RDW: 13.5 % (ref 11.2–14.5)
WBC: 5 10*3/uL (ref 3.9–10.3)
lymph#: 1.4 10*3/uL (ref 0.9–3.3)

## 2013-04-19 LAB — COMPREHENSIVE METABOLIC PANEL (CC13)
ALT: 14 U/L (ref 0–55)
AST: 23 U/L (ref 5–34)
Albumin: 3.6 g/dL (ref 3.5–5.0)
Alkaline Phosphatase: 43 U/L (ref 40–150)
Anion Gap: 10 mEq/L (ref 3–11)
BUN: 9.7 mg/dL (ref 7.0–26.0)
CO2: 26 mEq/L (ref 22–29)
Calcium: 9.2 mg/dL (ref 8.4–10.4)
Chloride: 109 mEq/L (ref 98–109)
Creatinine: 0.8 mg/dL (ref 0.6–1.1)
Glucose: 97 mg/dl (ref 70–140)
Potassium: 4 mEq/L (ref 3.5–5.1)
Sodium: 145 mEq/L (ref 136–145)
Total Bilirubin: 0.22 mg/dL (ref 0.20–1.20)
Total Protein: 7.1 g/dL (ref 6.4–8.3)

## 2013-04-19 LAB — LACTATE DEHYDROGENASE (CC13): LDH: 176 U/L (ref 125–245)

## 2013-04-23 ENCOUNTER — Ambulatory Visit (HOSPITAL_BASED_OUTPATIENT_CLINIC_OR_DEPARTMENT_OTHER): Payer: Medicare Other

## 2013-04-23 ENCOUNTER — Ambulatory Visit (HOSPITAL_BASED_OUTPATIENT_CLINIC_OR_DEPARTMENT_OTHER): Payer: Medicare Other | Admitting: Internal Medicine

## 2013-04-23 ENCOUNTER — Encounter: Payer: Self-pay | Admitting: Internal Medicine

## 2013-04-23 ENCOUNTER — Telehealth: Payer: Self-pay | Admitting: Internal Medicine

## 2013-04-23 VITALS — BP 114/59 | HR 91 | Temp 98.0°F | Resp 20 | Ht 64.0 in | Wt 123.5 lb

## 2013-04-23 DIAGNOSIS — C50919 Malignant neoplasm of unspecified site of unspecified female breast: Secondary | ICD-10-CM | POA: Diagnosis not present

## 2013-04-23 DIAGNOSIS — Z171 Estrogen receptor negative status [ER-]: Secondary | ICD-10-CM

## 2013-04-23 DIAGNOSIS — M810 Age-related osteoporosis without current pathological fracture: Secondary | ICD-10-CM

## 2013-04-23 DIAGNOSIS — D472 Monoclonal gammopathy: Secondary | ICD-10-CM | POA: Diagnosis not present

## 2013-04-23 LAB — BETA 2 MICROGLOBULIN, SERUM: Beta-2 Microglobulin: 1.69 mg/L (ref <=2.51)

## 2013-04-23 LAB — CANCER ANTIGEN 27.29: CA 27.29: 20 U/mL (ref 0–39)

## 2013-04-23 LAB — IGG, IGA, IGM
IgA: 40 mg/dL — ABNORMAL LOW (ref 69–380)
IgG (Immunoglobin G), Serum: 1680 mg/dL (ref 690–1700)
IgM, Serum: 27 mg/dL — ABNORMAL LOW (ref 52–322)

## 2013-04-23 LAB — KAPPA/LAMBDA LIGHT CHAINS
Kappa free light chain: 12.9 mg/dL — ABNORMAL HIGH (ref 0.33–1.94)
Kappa:Lambda Ratio: 11.83 — ABNORMAL HIGH (ref 0.26–1.65)
Lambda Free Lght Chn: 1.09 mg/dL (ref 0.57–2.63)

## 2013-04-23 MED ORDER — DENOSUMAB 60 MG/ML ~~LOC~~ SOLN
60.0000 mg | Freq: Once | SUBCUTANEOUS | Status: AC
  Administered 2013-04-23: 60 mg via SUBCUTANEOUS
  Filled 2013-04-23: qty 1

## 2013-04-23 NOTE — Progress Notes (Signed)
[facility] [phone]   [phone]  OFFICE PROGRESS NOTE  [name], MD [name] [address]  DIAGNOSIS:  1) Node-negative breast cancer status post completion of radiation 05/06/2010 currently on tamoxifen as well as prolia.  2) Monoclonal gammopathy of unknown significance on observation since 2007   PRIOR THERAPY:  1) status post right lumpectomy on 03/05/2010 and it showed invasive ductal carcinoma 0.9 CM, Positive for ER/PR and negative for HER-2, with ductal carcinoma in situ present and negative sentinel lymph node biopsies.  2) status post adjuvant radiotherapy completed in April of 2012   CURRENT THERAPY:  1) tamoxifen 20 mg by mouth daily started in 2012.  2) Prolia subcutaneous injection every 6 months  INTERVAL HISTORY: [name] 73 y.o. female returns to the clinic today for three-month followup visit. The patient is feeling fine today with no specific complaints. She had repeat myeloma panel performed as well as CA 27.29 recently and she is here for evaluation and discussion of her lab results. She denied having any significant weight loss or night sweats. The patient has no nausea or vomiting. She denied having any significant chest pain, shortness of breath, cough or hemoptysis. She is tolerating her treatment with tamoxifen fairly well.  MEDICAL HISTORY: Past Medical History  Diagnosis Date  . Esophageal Stricture   . Monoclonal gammopathy   . Anemia     Anemia-NOS / PMH, Dr Julien Nordmann  . Hyperlipidemia   . Vertical diplopia   . MVP (mitral valve prolapse)   . Cancer     stage I right breast  . Skin cancer     basal cell L neck  . Allergy     SEASONAL  . Arthritis   . Cataract     BILATERAL  . Osteoporosis     ALLERGIES:  is allergic to penicillins.  MEDICATIONS:  Current Outpatient Prescriptions  Medication Sig Dispense Refill  . aspirin 81 MG tablet Take 81 mg by mouth  daily.        [name] b complex vitamins tablet Take 1 tablet by mouth daily.      . Calcium 1500 MG tablet Take 1,500 mg by mouth.        . cholecalciferol (VITAMIN D) 1000 UNITS tablet Take 5,000 Units by mouth daily.        . Denosumab (PROLIA [place]) Inject into the skin every 6 (six) months.      . Multiple Vitamin (MULTIVITAMIN) capsule 2 tabs po qd       . nitroGLYCERIN (NITROSTAT) 0.3 MG SL tablet Place 0.3 mg under the tongue every 5 (five) minutes as needed.        . Omega-3 Fatty Acids (OMEGA 3 PO) 1 tab po qd       . tamoxifen (NOLVADEX) 20 MG tablet TAKE ONE TABLET BY MOUTH ONCE DAILY  30 tablet  2  . traMADol (ULTRAM) 50 MG tablet Take 1-2 tablets (50-100 mg total) by mouth at bedtime as needed for pain.  60 tablet  5  . vitamin B-12 (CYANOCOBALAMIN) 500 MCG tablet Take 2500 mcg daily.      [name] zoster vaccine live, PF, (ZOSTAVAX) 04888 UNT/0.65ML injection Inject 19,400 Units into the skin once.  1 each  0   No current facility-administered medications for this visit.    SURGICAL HISTORY:  Past Surgical History  Procedure Laterality Date  . Total abdominal hysterectomy w/ bilateral salpingoophorectomy  73 yrs old    for pain,prolapse ; G3 P2  . Salpingoophorectomy  2002    for benign growths  . Hemorrhoid surgery  1970's  . Appendectomy  2002  . Cardiac catheterization  2005    neg  . Colonoscopy      negative   . Bone biopsy  11/06/10  . Breast lumpectomy  02/2010    right breast lumpectomy and sentinel node biopsy  . Upper gastrointestinal endoscopy      REVIEW OF SYSTEMS:  A comprehensive review of systems was negative.   PHYSICAL EXAMINATION: General appearance: alert, cooperative and no distress Head: Normocephalic, without obvious abnormality, atraumatic Neck: no adenopathy, no JVD, supple, symmetrical, trachea midline and thyroid not enlarged, symmetric, no tenderness/mass/nodules Lymph nodes: Cervical, supraclavicular, and axillary nodes normal. Resp: clear to  auscultation bilaterally Cardio: regular rate and rhythm, S1, S2 normal, no murmur, click, rub or gallop GI: soft, non-tender; bowel sounds normal; no masses,  no organomegaly Extremities: extremities normal, atraumatic, no cyanosis or edema  ECOG PERFORMANCE STATUS: 0 - Asymptomatic  Blood pressure 114/59, pulse 91, temperature 98 F (36.7 C), temperature source Oral, resp. rate 20, height 5' 4"  (1.626 m), weight 123 lb 8 oz (56.019 kg), SpO2 97.00%.  LABORATORY DATA: Lab Results  Component Value Date   WBC 5.0 04/19/2013   HGB 12.6 04/19/2013   HCT 38.3 04/19/2013   MCV 100.6 04/19/2013   PLT 190 04/19/2013      Chemistry      Component Value Date/Time   NA 145 04/19/2013 1009   NA 142 06/15/2011 1327   K 4.0 04/19/2013 1009   K 4.2 06/15/2011 1327   CL 107 03/21/2012 1501   CL 108 06/15/2011 1327   CO2 26 04/19/2013 1009   CO2 26 06/15/2011 1327   BUN 9.7 04/19/2013 1009   BUN 9 06/15/2011 1327   CREATININE 0.8 04/19/2013 1009   CREATININE 0.85 06/15/2011 1327      Component Value Date/Time   CALCIUM 9.2 04/19/2013 1009   CALCIUM 8.9 06/15/2011 1327   ALKPHOS 43 04/19/2013 1009   ALKPHOS 44 06/15/2011 1327   AST 23 04/19/2013 1009   AST 25 06/15/2011 1327   ALT 14 04/19/2013 1009   ALT 14 06/15/2011 1327   BILITOT 0.22 04/19/2013 1009   BILITOT 0.3 06/15/2011 1327       RADIOGRAPHIC STUDIES: No results found.  ASSESSMENT AND PLAN: This is a very pleasant 73 years old white female with history of monoclonal gammopathy of undetermined significance and has been observation since 2007 in addition to diagnosis of node-negative breast cancer in 2012 and currently on treatment with tamoxifen as well as Prolia. The patient is doing fine and she has no evidence for disease progression on his recent bloodwork. I recommended for her to continue on observation for the MGUS in addition to tamoxifen and Prolia for the breast cancer. She would come back for followup visit in 6 months with repeat  myeloma panel as well as CA 27.29 and chest x-ray. She was advised to call immediately if she has any concerning symptoms in the interval.  The patient voices understanding of current disease status and treatment options and is in agreement with the current care plan.  All questions were answered. The patient knows to call the clinic with any problems, questions or concerns. We can certainly see the patient much sooner if necessary.  Disclaimer: This note was dictated with voice recognition software. Similar sounding words can  inadvertently be transcribed and may not be corrected upon review.

## 2013-04-23 NOTE — Telephone Encounter (Signed)
gv and printed appt schedand avs for pt for Sept °

## 2013-05-17 ENCOUNTER — Encounter: Payer: Self-pay | Admitting: Family Medicine

## 2013-05-17 ENCOUNTER — Ambulatory Visit (INDEPENDENT_AMBULATORY_CARE_PROVIDER_SITE_OTHER): Payer: Medicare Other | Admitting: Family Medicine

## 2013-05-17 VITALS — BP 116/66 | HR 88 | Temp 97.8°F | Wt 127.8 lb

## 2013-05-17 DIAGNOSIS — R259 Unspecified abnormal involuntary movements: Secondary | ICD-10-CM

## 2013-05-17 DIAGNOSIS — H938X9 Other specified disorders of ear, unspecified ear: Secondary | ICD-10-CM | POA: Diagnosis not present

## 2013-05-17 DIAGNOSIS — R251 Tremor, unspecified: Secondary | ICD-10-CM

## 2013-05-17 NOTE — Patient Instructions (Signed)
Let me check with neurology and we'll be in touch in the meantime.  I would use a little hydrocortisone on your right ear if you have more itching.

## 2013-05-17 NOTE — Progress Notes (Signed)
Pre visit review using our clinic review tool, if applicable. No additional management support is needed unless otherwise documented below in the visit note.  L arm and hand/thumb (but not 2nd-5th digit) movement.  Episoidic.  Not on R side. No in BLE. Going on about 50% of her waking time, unclear if happens when she is asleep. No pain. She can make it stop, ie with gripping. No weakness. Normal sensation in the hands. No trouble swallowing. No voice changes. No CP, not SOB.   R ear itchy in the canal. Feels wet. No L sided sx.   Cousin with parkinson's.   Meds, vitals, and allergies reviewed.   ROS: See HPI.  Otherwise, noncontributory.  GEN: nad, alert and oriented HEENT: mucous membranes moist, TM and canal wnl B NECK: supple w/o LA CV: rrr.  no murmur PULM: ctab, no inc wob ABD: soft, +bs EXT: no edema SKIN: no acute rash CN 2-12 wnl B, S/S/DTR wnl x4 except for slight pronation/supination tremor on L hand at rest and with cerebellar testing. Not noted on the R hand.

## 2013-05-18 DIAGNOSIS — R251 Tremor, unspecified: Secondary | ICD-10-CM | POA: Insufficient documentation

## 2013-05-18 DIAGNOSIS — H938X9 Other specified disorders of ear, unspecified ear: Secondary | ICD-10-CM | POA: Insufficient documentation

## 2013-05-18 NOTE — Assessment & Plan Note (Signed)
I'd like to check with neuro and then notify pt.  She has intermittent but progressive sx.  She agrees, will await return notice from our clinic.

## 2013-05-18 NOTE — Assessment & Plan Note (Signed)
Topical hydrocortisone prn and f/u prn.  Benign exam.

## 2013-05-22 ENCOUNTER — Telehealth: Payer: Self-pay | Admitting: Family Medicine

## 2013-05-22 DIAGNOSIS — R251 Tremor, unspecified: Secondary | ICD-10-CM

## 2013-05-22 NOTE — Telephone Encounter (Signed)
Patient notified

## 2013-05-22 NOTE — Telephone Encounter (Signed)
Please call pt.  I talked to Dr. Carles Collet and she advised getting MRI with and without contrast and then seeing her.  I put in the referral and the orders.  Thanks.

## 2013-06-01 ENCOUNTER — Telehealth: Payer: Self-pay | Admitting: Family Medicine

## 2013-06-01 ENCOUNTER — Ambulatory Visit: Admission: RE | Admit: 2013-06-01 | Payer: Medicare Other | Source: Ambulatory Visit

## 2013-06-01 NOTE — Telephone Encounter (Signed)
Patient was supposed to have the MRI Brain today and while there she was extremely claustrophobic and couldn't go through with the MRI. Please call her with what she do now. Appt with Dr Tat is next Tuesday. Please call patient with what she should do.

## 2013-06-04 ENCOUNTER — Other Ambulatory Visit: Payer: Self-pay | Admitting: Family Medicine

## 2013-06-04 NOTE — Telephone Encounter (Signed)
Last office visit 05/17/2013.  Last refilled on 10/11/2012 for #60 with 5 refills.  Ok to refill?

## 2013-06-05 ENCOUNTER — Encounter: Payer: Self-pay | Admitting: Neurology

## 2013-06-05 ENCOUNTER — Ambulatory Visit (INDEPENDENT_AMBULATORY_CARE_PROVIDER_SITE_OTHER): Payer: Medicare Other | Admitting: Neurology

## 2013-06-05 VITALS — BP 128/70 | HR 80 | Resp 18 | Ht 65.0 in | Wt 128.0 lb

## 2013-06-05 DIAGNOSIS — G20 Parkinson's disease: Secondary | ICD-10-CM | POA: Insufficient documentation

## 2013-06-05 MED ORDER — CARBIDOPA-LEVODOPA 25-100 MG PO TABS: 1.0000 | ORAL_TABLET | Freq: Three times a day (TID) | ORAL | Status: DC

## 2013-06-05 NOTE — Telephone Encounter (Signed)
Rx called to pharmacy

## 2013-06-05 NOTE — Telephone Encounter (Signed)
Please call in.  Thanks.   
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
## 2013-06-05 NOTE — Patient Instructions (Signed)
1. Start Carbidopa Levodopa as follows: 1/2 tab three times a day before meals x 1 wk, then 1/2 in am & noon & 1 in evening for a week, then 1/2 in am &1 at noon &one in evening for a week, then 1 tablet three times a day before meals. 2. You have been referred to [facility]. They will call you directly to schedule an appointment.  Please call [phone] if you do not hear from them.  3. Follow up 21/2 months.

## 2013-06-15 DIAGNOSIS — Z0000 Encounter for general adult medical examination without abnormal findings: Secondary | ICD-10-CM | POA: Diagnosis not present

## 2013-06-15 DIAGNOSIS — Z01419 Encounter for gynecological examination (general) (routine) without abnormal findings: Secondary | ICD-10-CM | POA: Diagnosis not present

## 2013-06-25 ENCOUNTER — Ambulatory Visit: Payer: Medicare Other | Attending: Family Medicine | Admitting: Physical Therapy

## 2013-06-25 DIAGNOSIS — G20A1 Parkinson's disease without dyskinesia, without mention of fluctuations: Secondary | ICD-10-CM | POA: Diagnosis not present

## 2013-06-25 DIAGNOSIS — G20 Parkinson's disease: Secondary | ICD-10-CM | POA: Insufficient documentation

## 2013-06-25 DIAGNOSIS — R269 Unspecified abnormalities of gait and mobility: Secondary | ICD-10-CM | POA: Insufficient documentation

## 2013-06-25 DIAGNOSIS — R293 Abnormal posture: Secondary | ICD-10-CM | POA: Insufficient documentation

## 2013-06-25 DIAGNOSIS — IMO0001 Reserved for inherently not codable concepts without codable children: Secondary | ICD-10-CM | POA: Diagnosis not present

## 2013-06-28 ENCOUNTER — Ambulatory Visit: Payer: Medicare Other | Admitting: Physical Therapy

## 2013-06-28 DIAGNOSIS — IMO0001 Reserved for inherently not codable concepts without codable children: Secondary | ICD-10-CM | POA: Diagnosis not present

## 2013-07-02 ENCOUNTER — Ambulatory Visit: Payer: Medicare Other | Admitting: Physical Therapy

## 2013-07-02 DIAGNOSIS — IMO0001 Reserved for inherently not codable concepts without codable children: Secondary | ICD-10-CM | POA: Diagnosis not present

## 2013-07-04 ENCOUNTER — Ambulatory Visit: Payer: Medicare Other | Admitting: Physical Therapy

## 2013-07-04 DIAGNOSIS — IMO0001 Reserved for inherently not codable concepts without codable children: Secondary | ICD-10-CM | POA: Diagnosis not present

## 2013-07-10 ENCOUNTER — Ambulatory Visit: Payer: Medicare Other | Admitting: Physical Therapy

## 2013-07-10 DIAGNOSIS — IMO0001 Reserved for inherently not codable concepts without codable children: Secondary | ICD-10-CM | POA: Diagnosis not present

## 2013-07-12 ENCOUNTER — Ambulatory Visit: Payer: Medicare Other | Admitting: Physical Therapy

## 2013-07-12 DIAGNOSIS — IMO0001 Reserved for inherently not codable concepts without codable children: Secondary | ICD-10-CM | POA: Diagnosis not present

## 2013-07-16 ENCOUNTER — Ambulatory Visit: Payer: Medicare Other | Admitting: Physical Therapy

## 2013-07-16 DIAGNOSIS — IMO0001 Reserved for inherently not codable concepts without codable children: Secondary | ICD-10-CM | POA: Diagnosis not present

## 2013-07-20 ENCOUNTER — Ambulatory Visit: Payer: Medicare Other | Admitting: Physical Therapy

## 2013-07-20 DIAGNOSIS — IMO0001 Reserved for inherently not codable concepts without codable children: Secondary | ICD-10-CM | POA: Diagnosis not present

## 2013-07-21 ENCOUNTER — Other Ambulatory Visit: Payer: Self-pay | Admitting: Internal Medicine

## 2013-08-17 ENCOUNTER — Encounter: Payer: Self-pay | Admitting: Neurology

## 2013-08-17 ENCOUNTER — Ambulatory Visit (INDEPENDENT_AMBULATORY_CARE_PROVIDER_SITE_OTHER): Payer: Medicare Other | Admitting: Neurology

## 2013-08-17 VITALS — BP 94/52 | HR 80 | Resp 14 | Ht 64.0 in | Wt 129.0 lb

## 2013-08-17 DIAGNOSIS — G20 Parkinson's disease: Secondary | ICD-10-CM

## 2013-08-17 DIAGNOSIS — G20A1 Parkinson's disease without dyskinesia, without mention of fluctuations: Secondary | ICD-10-CM

## 2013-08-17 NOTE — Progress Notes (Signed)
[name] was seen today in the movement disorders clinic for neurologic consultation at the request of [name], MD.  The consultation is for the evaluation of tremor.  The records that were made available to me were reviewed.  No one accompanies the patient.   The first symptom(s) the patient noticed was unilateral hand tremor on the L and this was 4-5 months ago.  She is R hand dominant.  She states that it was intermittent when it started but now it is more constant.  No tremor elsewhere.    Only living relative with PD is a cousin, who is currently being treated at [facility].  08/17/13 update:  Pt states that tremor is improved on levodopa but not sure if it is otherwise helping (but may be).  Takes med at 8am/1pm/5-7pm.  No SE.  Blood pressure has been low but states that it is always low.  No lightheadedness.  Balance is not good, but no falls.  She completed therapy.  Is exercising at home.  She walks an hr a day 6 days per week and when she goes to the gym, she will use the treadmill.  Has signed up for PD exercise class.    Neuroimaging has not previously been performed.  It was supposed to be done 06/01/13 but pt cancelled as she was claustrophobic and couldn't do the scan even though it was in an open unit.   PREVIOUS MEDICATIONS: none to date  ALLERGIES:   Allergies  Allergen Reactions  . Penicillins Shortness Of Breath and Itching    CURRENT MEDICATIONS:  Current Outpatient Prescriptions on File Prior to Visit  Medication Sig Dispense Refill  . aspirin 81 MG tablet Take 81 mg by mouth daily.        [name] b complex vitamins tablet Take 1 tablet by mouth daily.      . Calcium 1500 MG tablet Take 1,500 mg by mouth.        . carbidopa-levodopa (SINEMET IR) 25-100 MG per tablet Take 1 tablet by mouth 3 (three) times daily.  90 tablet  5  . cholecalciferol (VITAMIN D) 1000 UNITS tablet Take 5,000 Units by mouth daily.        . Denosumab (PROLIA [place]) Inject into the skin every 6  (six) months.      . Multiple Vitamin (MULTIVITAMIN) capsule 2 tabs po qd       . Omega-3 Fatty Acids (OMEGA 3 PO) 1 tab po qd       . tamoxifen (NOLVADEX) 20 MG tablet TAKE ONE TABLET BY MOUTH ONCE DAILY  30 tablet  0  . traMADol (ULTRAM) 50 MG tablet TAKE ONE TO TWO TABLETS BY MOUTH AT BEDTIME AS NEEDED FOR PAIN  60 tablet  5  . vitamin B-12 (CYANOCOBALAMIN) 500 MCG tablet Take 2500 mcg daily.      . nitroGLYCERIN (NITROSTAT) 0.3 MG SL tablet Place 0.3 mg under the tongue every 5 (five) minutes as needed.         No current facility-administered medications on file prior to visit.    PAST MEDICAL HISTORY:   Past Medical History  Diagnosis Date  . Esophageal Stricture   . Monoclonal gammopathy   . Anemia     Anemia-NOS / PMH, Dr Julien Nordmann  . Hyperlipidemia   . Vertical diplopia   . MVP (mitral valve prolapse)   . Cancer     stage I right breast  . Skin cancer  basal cell L neck  . Allergy     SEASONAL  . Arthritis   . Cataract     BILATERAL  . Osteoporosis     PAST SURGICAL HISTORY:   Past Surgical History  Procedure Laterality Date  . Total abdominal hysterectomy w/ bilateral salpingoophorectomy  73 yrs old    for pain,prolapse ; G3 P2  . Salpingoophorectomy  2002    for benign growths  . Hemorrhoid surgery  1970's  . Appendectomy  2002  . Cardiac catheterization  2005    neg  . Colonoscopy      negative   . Bone biopsy  11/06/10  . Breast lumpectomy  02/2010    right breast lumpectomy and sentinel node biopsy  . Upper gastrointestinal endoscopy      SOCIAL HISTORY:   History   Social History  . Marital Status: Married    Spouse Name: N/A    Number of Children: 2  . Years of Education: N/A   Occupational History  . Retired    Social History Main Topics  . Smoking status: Never Smoker   . Smokeless tobacco: Never Used  . Alcohol Use: No  . Drug Use: No  . Sexual Activity: Not on file   Other Topics Concern  . Not on file   Social History  Narrative   Occupation: Textile   Married, [phone]   Patient has never smoked.    Alcohol Use - no   Illicit Drug Use - no   Patient does not get regular exercise.    Daily Caffeine Use: 3 cups of coffee daily    FAMILY HISTORY:   Family Status  Relation Status Death Age  . Mother Deceased     heart disease  . Brother Alive     diabetes  . Brother Deceased     heart disease, renal failure  . Father Deceased     hepatitis  . Sister Alive     diabetes  . Brother Deceased     drowned as a teenager  . Brother Alive     diabetes  . Brother Alive     diabetes  . Sister Alive     diabetes, liver disease (alcohol)  . Sister Alive     healthy  . Son Alive     sarcoidosis  . Daughter Alive     healthy  . Cousin Alive     Parkinsons disease    ROS:  A complete 10 system review of systems was obtained and was unremarkable apart from what is mentioned above.  PHYSICAL EXAMINATION:    VITALS:   Filed Vitals:   08/17/13 1043  BP: 94/52  Pulse: 80  Resp: 14  Height: 5\' 4"  (1.626 m)  Weight: 129 lb (58.514 kg)    GEN:  The patient appears stated age and is in NAD. HEENT:  Normocephalic, atraumatic.  The mucous membranes are moist. The superficial temporal arteries are without ropiness or tenderness.   Neurological examination:  Orientation: The patient is alert and oriented x3.  Cranial nerves: There is good facial symmetry. There is facial hypomimia. The speech is fluent and clear. Soft palate rises symmetrically and there is no tongue deviation. Hearing is intact to conversational tone. Sensation: Sensation is intact to light touch throughout Motor: Strength is 5/5 in the bilateral upper and lower extremities.   Shoulder shrug is equal and symmetric.  There is no pronator drift. Deep tendon reflexes: Deep tendon reflexes are 3/4 at  the bilateral biceps, triceps, brachioradialis, patella and achilles. Plantar responses are downgoing bilaterally.  Movement  examination: Tone: There is normal tone in the bilateral upper extremities (improved) Abnormal movements: There is very minor tremor on the L with distraction only Coordination:  There is minor decrease in RAM's on the L with finger taps Gait and Station: The patient has no difficulty arising out of a deep-seated chair without the use of the hands.  The patient's stride length is normal.  The patient has a negative pull test.      ASSESSMENT/PLAN:  1.  Idiopathic Parkinson's disease.  She was dx in May, 2015.    -She is doing well on carbidopa/levodopa 25/100 tid.    -Does have low BP but is asymptommatic.  Will monitor.    -encouraged continue to exercise.  Pt education provided.   2.  Return in about 3 months (around 11/17/2013).

## 2013-08-27 DIAGNOSIS — D235 Other benign neoplasm of skin of trunk: Secondary | ICD-10-CM | POA: Diagnosis not present

## 2013-08-27 DIAGNOSIS — L259 Unspecified contact dermatitis, unspecified cause: Secondary | ICD-10-CM | POA: Diagnosis not present

## 2013-08-27 DIAGNOSIS — Z85828 Personal history of other malignant neoplasm of skin: Secondary | ICD-10-CM | POA: Diagnosis not present

## 2013-08-27 DIAGNOSIS — D485 Neoplasm of uncertain behavior of skin: Secondary | ICD-10-CM | POA: Diagnosis not present

## 2013-08-31 ENCOUNTER — Other Ambulatory Visit: Payer: Self-pay | Admitting: Internal Medicine

## 2013-08-31 DIAGNOSIS — C50919 Malignant neoplasm of unspecified site of unspecified female breast: Secondary | ICD-10-CM

## 2013-09-17 DIAGNOSIS — D485 Neoplasm of uncertain behavior of skin: Secondary | ICD-10-CM | POA: Diagnosis not present

## 2013-09-22 ENCOUNTER — Encounter: Payer: Self-pay | Admitting: Gastroenterology

## 2013-10-04 ENCOUNTER — Other Ambulatory Visit: Payer: Self-pay | Admitting: Internal Medicine

## 2013-10-06 ENCOUNTER — Other Ambulatory Visit: Payer: Self-pay | Admitting: Internal Medicine

## 2013-10-08 ENCOUNTER — Other Ambulatory Visit (HOSPITAL_BASED_OUTPATIENT_CLINIC_OR_DEPARTMENT_OTHER): Payer: Medicare Other

## 2013-10-08 ENCOUNTER — Ambulatory Visit (HOSPITAL_BASED_OUTPATIENT_CLINIC_OR_DEPARTMENT_OTHER): Payer: Medicare Other

## 2013-10-08 VITALS — BP 122/58 | HR 84 | Temp 98.1°F

## 2013-10-08 DIAGNOSIS — C50919 Malignant neoplasm of unspecified site of unspecified female breast: Secondary | ICD-10-CM

## 2013-10-08 DIAGNOSIS — M810 Age-related osteoporosis without current pathological fracture: Secondary | ICD-10-CM | POA: Diagnosis not present

## 2013-10-08 DIAGNOSIS — Z23 Encounter for immunization: Secondary | ICD-10-CM | POA: Diagnosis not present

## 2013-10-08 DIAGNOSIS — D472 Monoclonal gammopathy: Secondary | ICD-10-CM

## 2013-10-08 LAB — CBC WITH DIFFERENTIAL/PLATELET
BASO%: 0.5 % (ref 0.0–2.0)
Basophils Absolute: 0 10*3/uL (ref 0.0–0.1)
EOS%: 3.1 % (ref 0.0–7.0)
Eosinophils Absolute: 0.2 10*3/uL (ref 0.0–0.5)
HCT: 39.5 % (ref 34.8–46.6)
HGB: 12.7 g/dL (ref 11.6–15.9)
LYMPH%: 24.7 % (ref 14.0–49.7)
MCH: 32.7 pg (ref 25.1–34.0)
MCHC: 32.2 g/dL (ref 31.5–36.0)
MCV: 101.5 fL — ABNORMAL HIGH (ref 79.5–101.0)
MONO#: 0.3 10*3/uL (ref 0.1–0.9)
MONO%: 4.3 % (ref 0.0–14.0)
NEUT#: 5 10*3/uL (ref 1.5–6.5)
NEUT%: 67.4 % (ref 38.4–76.8)
Platelets: 246 10*3/uL (ref 145–400)
RBC: 3.89 10*6/uL (ref 3.70–5.45)
RDW: 13.6 % (ref 11.2–14.5)
WBC: 7.5 10*3/uL (ref 3.9–10.3)
lymph#: 1.8 10*3/uL (ref 0.9–3.3)

## 2013-10-08 LAB — COMPREHENSIVE METABOLIC PANEL (CC13)
ALT: 21 U/L (ref 0–55)
AST: 24 U/L (ref 5–34)
Albumin: 3.4 g/dL — ABNORMAL LOW (ref 3.5–5.0)
Alkaline Phosphatase: 45 U/L (ref 40–150)
Anion Gap: 8 mEq/L (ref 3–11)
BUN: 12.1 mg/dL (ref 7.0–26.0)
CO2: 27 mEq/L (ref 22–29)
Calcium: 9.1 mg/dL (ref 8.4–10.4)
Chloride: 108 mEq/L (ref 98–109)
Creatinine: 0.8 mg/dL (ref 0.6–1.1)
Glucose: 99 mg/dl (ref 70–140)
Potassium: 3.7 mEq/L (ref 3.5–5.1)
Sodium: 144 mEq/L (ref 136–145)
Total Bilirubin: 0.39 mg/dL (ref 0.20–1.20)
Total Protein: 7.2 g/dL (ref 6.4–8.3)

## 2013-10-08 LAB — LACTATE DEHYDROGENASE (CC13): LDH: 188 U/L (ref 125–245)

## 2013-10-08 MED ORDER — DENOSUMAB 60 MG/ML ~~LOC~~ SOLN
60.0000 mg | Freq: Once | SUBCUTANEOUS | Status: AC
  Administered 2013-10-08: 60 mg via SUBCUTANEOUS
  Filled 2013-10-08: qty 1

## 2013-10-08 MED ORDER — INFLUENZA VAC SPLIT QUAD 0.5 ML IM SUSY
0.5000 mL | PREFILLED_SYRINGE | Freq: Once | INTRAMUSCULAR | Status: AC
  Administered 2013-10-08: 0.5 mL via INTRAMUSCULAR
  Filled 2013-10-08: qty 0.5

## 2013-10-10 LAB — CANCER ANTIGEN 27.29: CA 27.29: 20 U/mL (ref 0–39)

## 2013-10-10 LAB — KAPPA/LAMBDA LIGHT CHAINS
Kappa free light chain: 9.23 mg/dL — ABNORMAL HIGH (ref 0.33–1.94)
Kappa:Lambda Ratio: 13.98 — ABNORMAL HIGH (ref 0.26–1.65)
Lambda Free Lght Chn: 0.66 mg/dL (ref 0.57–2.63)

## 2013-10-10 LAB — IGG, IGA, IGM
IgA: 41 mg/dL — ABNORMAL LOW (ref 69–380)
IgG (Immunoglobin G), Serum: 1570 mg/dL (ref 690–1700)
IgM, Serum: 25 mg/dL — ABNORMAL LOW (ref 52–322)

## 2013-10-10 LAB — BETA 2 MICROGLOBULIN, SERUM: Beta-2 Microglobulin: 1.88 mg/L (ref <=2.51)

## 2013-10-15 ENCOUNTER — Encounter: Payer: Self-pay | Admitting: Internal Medicine

## 2013-10-15 ENCOUNTER — Ambulatory Visit (HOSPITAL_BASED_OUTPATIENT_CLINIC_OR_DEPARTMENT_OTHER): Payer: Medicare Other | Admitting: Internal Medicine

## 2013-10-15 ENCOUNTER — Telehealth: Payer: Self-pay | Admitting: Internal Medicine

## 2013-10-15 VITALS — BP 120/62 | HR 78 | Temp 98.4°F | Resp 18 | Ht 64.0 in | Wt 130.4 lb

## 2013-10-15 DIAGNOSIS — D472 Monoclonal gammopathy: Secondary | ICD-10-CM | POA: Diagnosis not present

## 2013-10-15 DIAGNOSIS — C50911 Malignant neoplasm of unspecified site of right female breast: Secondary | ICD-10-CM

## 2013-10-15 DIAGNOSIS — C50919 Malignant neoplasm of unspecified site of unspecified female breast: Secondary | ICD-10-CM

## 2013-10-15 NOTE — Progress Notes (Signed)
[place] Telephone:([phone]) [phone]   [phone]  OFFICE PROGRESS NOTE  [name], MD [name] [address]  DIAGNOSIS:  1) Node-negative breast cancer status post completion of radiation 05/06/2010 currently on tamoxifen as well as prolia.  2) Monoclonal gammopathy of unknown significance on observation since 2007   PRIOR THERAPY:  1) status post right lumpectomy on 03/05/2010 and it showed invasive ductal carcinoma 0.9 CM, Positive for ER/PR and negative for HER-2, with ductal carcinoma in situ present and negative sentinel lymph node biopsies.  2) status post adjuvant radiotherapy completed in April of 2012   CURRENT THERAPY:  1) tamoxifen 20 mg by mouth daily started in 2012.  2) Prolia subcutaneous injection every 6 months  INTERVAL HISTORY: [name] 73 y.o. female returns to the clinic today for 6 months followup visit. She is rating her treatment with tamoxifen fairly well. The patient is feeling fine today with no specific complaints. She had repeat myeloma panel performed as well as CA 27.29 recently and she is here for evaluation and discussion of her lab results. She denied having any significant weight loss or night sweats. The patient has no nausea or vomiting. She denied having any significant chest pain, shortness of breath, cough or hemoptysis. She is tolerating her treatment with tamoxifen fairly well.  MEDICAL HISTORY: Past Medical History  Diagnosis Date  . Esophageal Stricture   . Monoclonal gammopathy   . Anemia     Anemia-NOS / PMH, Dr Julien Nordmann  . Hyperlipidemia   . Vertical diplopia   . MVP (mitral valve prolapse)   . Cancer     stage I right breast  . Skin cancer     basal cell L neck  . Allergy     SEASONAL  . Arthritis   . Cataract     BILATERAL  . Osteoporosis     ALLERGIES:  is allergic to penicillins.  MEDICATIONS:  Current Outpatient Prescriptions  Medication Sig Dispense  Refill  . aspirin 81 MG tablet Take 81 mg by mouth daily.        [name] b complex vitamins tablet Take 1 tablet by mouth daily.      . Calcium 1500 MG tablet Take 1,500 mg by mouth.        . carbidopa-levodopa (SINEMET IR) 25-100 MG per tablet Take 1 tablet by mouth 3 (three) times daily.  90 tablet  5  . cholecalciferol (VITAMIN D) 1000 UNITS tablet Take 5,000 Units by mouth daily.        . Denosumab (PROLIA [place]) Inject into the skin every 6 (six) months.      . Multiple Vitamin (MULTIVITAMIN) capsule 2 tabs po qd       . nitroGLYCERIN (NITROSTAT) 0.3 MG SL tablet Place 0.3 mg under the tongue every 5 (five) minutes as needed.        . Omega-3 Fatty Acids (OMEGA 3 PO) 1 tab po qd       . tamoxifen (NOLVADEX) 20 MG tablet TAKE ONE TABLET BY MOUTH ONCE DAILY  30 tablet  0  . traMADol (ULTRAM) 50 MG tablet TAKE ONE TO TWO TABLETS BY MOUTH AT BEDTIME AS NEEDED FOR PAIN  60 tablet  5  . vitamin B-12 (CYANOCOBALAMIN) 500 MCG tablet Take 2500 mcg daily.       No current facility-administered medications for this visit.    SURGICAL HISTORY:  Past Surgical History  Procedure Laterality  Date  . Total abdominal hysterectomy w/ bilateral salpingoophorectomy  73 yrs old    for pain,prolapse ; G3 P2  . Salpingoophorectomy  2002    for benign growths  . Hemorrhoid surgery  1970's  . Appendectomy  2002  . Cardiac catheterization  2005    neg  . Colonoscopy      negative   . Bone biopsy  11/06/10  . Breast lumpectomy  02/2010    right breast lumpectomy and sentinel node biopsy  . Upper gastrointestinal endoscopy      REVIEW OF SYSTEMS:  A comprehensive review of systems was negative.   PHYSICAL EXAMINATION: General appearance: alert, cooperative and no distress Head: Normocephalic, without obvious abnormality, atraumatic Neck: no adenopathy, no JVD, supple, symmetrical, trachea midline and thyroid not enlarged, symmetric, no tenderness/mass/nodules Lymph nodes: Cervical, supraclavicular, and  axillary nodes normal. Resp: clear to auscultation bilaterally Cardio: regular rate and rhythm, S1, S2 normal, no murmur, click, rub or gallop GI: soft, non-tender; bowel sounds normal; no masses,  no organomegaly Extremities: extremities normal, atraumatic, no cyanosis or edema Breast exam showed no palpable masses in the breast or axilla bilaterally.  ECOG PERFORMANCE STATUS: 0 - Asymptomatic  Blood pressure 120/62, pulse 78, temperature 98.4 F (36.9 C), temperature source Oral, resp. rate 18, height 5' 4"  (1.626 m), weight 130 lb 6.4 oz (59.149 kg), SpO2 98.00%.  LABORATORY DATA: Lab Results  Component Value Date   WBC 7.5 10/08/2013   HGB 12.7 10/08/2013   HCT 39.5 10/08/2013   MCV 101.5* 10/08/2013   PLT 246 10/08/2013      Chemistry      Component Value Date/Time   NA 144 10/08/2013 1050   NA 142 06/15/2011 1327   K 3.7 10/08/2013 1050   K 4.2 06/15/2011 1327   CL 107 03/21/2012 1501   CL 108 06/15/2011 1327   CO2 27 10/08/2013 1050   CO2 26 06/15/2011 1327   BUN 12.1 10/08/2013 1050   BUN 9 06/15/2011 1327   CREATININE 0.8 10/08/2013 1050   CREATININE 0.85 06/15/2011 1327      Component Value Date/Time   CALCIUM 9.1 10/08/2013 1050   CALCIUM 8.9 06/15/2011 1327   ALKPHOS 45 10/08/2013 1050   ALKPHOS 44 06/15/2011 1327   AST 24 10/08/2013 1050   AST 25 06/15/2011 1327   ALT 21 10/08/2013 1050   ALT 14 06/15/2011 1327   BILITOT 0.39 10/08/2013 1050   BILITOT 0.3 06/15/2011 1327       RADIOGRAPHIC STUDIES: No results found.  ASSESSMENT AND PLAN: This is a very pleasant 73 years old white female with history of monoclonal gammopathy of undetermined significance and has been observation since 2007 in addition to diagnosis of node-negative breast cancer in 2012 and currently on treatment with tamoxifen as well as Prolia. The patient is doing fine and she has no evidence for disease progression on his recent bloodwork. I recommended for her to continue on observation for the MGUS in  addition to tamoxifen and Prolia for the breast cancer. She will complete 5 years of treatment with tamoxifen before consideration of switch to aromatase inhibitor. Her next mammogram is scheduled for December of 2015. She would come back for followup visit in 6 months with repeat myeloma panel as well as CA 27.29. She was advised to call immediately if she has any concerning symptoms in the interval.  The patient voices understanding of current disease status and treatment options and is in agreement with the current  care plan.  All questions were answered. The patient knows to call the clinic with any problems, questions or concerns. We can certainly see the patient much sooner if necessary.  Disclaimer: This note was dictated with voice recognition software. Similar sounding words can inadvertently be transcribed and may not be corrected upon review.

## 2013-10-15 NOTE — Telephone Encounter (Signed)
gv adn printed appt sched and avs for pt fro March 2016

## 2013-11-06 ENCOUNTER — Other Ambulatory Visit: Payer: Self-pay | Admitting: Internal Medicine

## 2013-11-06 DIAGNOSIS — C50919 Malignant neoplasm of unspecified site of unspecified female breast: Secondary | ICD-10-CM

## 2013-11-20 ENCOUNTER — Ambulatory Visit (INDEPENDENT_AMBULATORY_CARE_PROVIDER_SITE_OTHER): Payer: Medicare Other | Admitting: Neurology

## 2013-11-20 ENCOUNTER — Encounter: Payer: Self-pay | Admitting: Neurology

## 2013-11-20 VITALS — BP 110/58 | HR 88 | Ht 64.0 in | Wt 132.0 lb

## 2013-11-20 DIAGNOSIS — G20 Parkinson's disease: Secondary | ICD-10-CM | POA: Diagnosis not present

## 2013-11-20 DIAGNOSIS — M25511 Pain in right shoulder: Secondary | ICD-10-CM | POA: Diagnosis not present

## 2013-11-20 DIAGNOSIS — G20A1 Parkinson's disease without dyskinesia, without mention of fluctuations: Secondary | ICD-10-CM

## 2013-11-20 NOTE — Progress Notes (Signed)
[name] was seen today in the movement disorders clinic for neurologic consultation at the request of [name], MD.  The consultation is for the evaluation of tremor.  The records that were made available to me were reviewed.  No one accompanies the patient.   The first symptom(s) the patient noticed was unilateral hand tremor on the L and this was 4-5 months ago.  She is R hand dominant.  She states that it was intermittent when it started but now it is more constant.  No tremor elsewhere.    Only living relative with PD is a cousin, who is currently being treated at [facility].  08/17/13 update:  Pt states that tremor is improved on levodopa but not sure if it is otherwise helping (but may be).  Takes med at 8am/1pm/5-7pm.  No SE.  Blood pressure has been low but states that it is always low.  No lightheadedness.  Balance is not good, but no falls.  She completed therapy.  Is exercising at home.  She walks an hr a day 6 days per week and when she goes to the gym, she will use the treadmill.  Has signed up for PD exercise class.   11/20/13 update:  Pt f/u re: PD.  Is on carbidopa/levodopa 25/100 tid.  Is doing well.  No falls.  No lightheadedness.  No hallucinations.  Exercising faithfully.   Is having an aching pain in the shoulder and in the thumb region on the R side but PD primarily affects her on the L.  Some occasional tremor on the L.    Neuroimaging has not previously been performed.  It was supposed to be done 06/01/13 but pt cancelled as she was claustrophobic and couldn't do the scan even though it was in an open unit.   PREVIOUS MEDICATIONS: none to date  ALLERGIES:   Allergies  Allergen Reactions  . Penicillins Shortness Of Breath and Itching    CURRENT MEDICATIONS:  Current Outpatient Prescriptions on File Prior to Visit  Medication Sig Dispense Refill  . aspirin 81 MG tablet Take 81 mg by mouth daily.        [name] b complex vitamins tablet Take 1 tablet by mouth daily.       . Calcium 1500 MG tablet Take 1,500 mg by mouth.        . carbidopa-levodopa (SINEMET IR) 25-100 MG per tablet Take 1 tablet by mouth 3 (three) times daily.  90 tablet  5  . cholecalciferol (VITAMIN D) 1000 UNITS tablet Take 5,000 Units by mouth daily.        . Denosumab (PROLIA [place]) Inject into the skin every 6 (six) months.      . Multiple Vitamin (MULTIVITAMIN) capsule 2 tabs po qd       . nitroGLYCERIN (NITROSTAT) 0.3 MG SL tablet Place 0.3 mg under the tongue every 5 (five) minutes as needed.        . Omega-3 Fatty Acids (OMEGA 3 PO) 1 tab po qd       . tamoxifen (NOLVADEX) 20 MG tablet TAKE ONE TABLET BY MOUTH ONCE DAILY  30 tablet  5  . traMADol (ULTRAM) 50 MG tablet TAKE ONE TO TWO TABLETS BY MOUTH AT BEDTIME AS NEEDED FOR PAIN  60 tablet  5  . vitamin B-12 (CYANOCOBALAMIN) 500 MCG tablet Take 2500 mcg daily.       No current facility-administered medications on file prior to visit.    PAST MEDICAL HISTORY:  Past Medical History  Diagnosis Date  . Esophageal Stricture   . Monoclonal gammopathy   . Anemia     Anemia-NOS / PMH, Dr Julien Nordmann  . Hyperlipidemia   . Vertical diplopia   . MVP (mitral valve prolapse)   . Cancer     stage I right breast  . Skin cancer     basal cell L neck  . Allergy     SEASONAL  . Arthritis   . Cataract     BILATERAL  . Osteoporosis     PAST SURGICAL HISTORY:   Past Surgical History  Procedure Laterality Date  . Total abdominal hysterectomy w/ bilateral salpingoophorectomy  73 yrs old    for pain,prolapse ; G3 P2  . Salpingoophorectomy  2002    for benign growths  . Hemorrhoid surgery  1970's  . Appendectomy  2002  . Cardiac catheterization  2005    neg  . Colonoscopy      negative   . Bone biopsy  11/06/10  . Breast lumpectomy  02/2010    right breast lumpectomy and sentinel node biopsy  . Upper gastrointestinal endoscopy      SOCIAL HISTORY:   History   Social History  . Marital Status: Married    Spouse Name: N/A     Number of Children: 2  . Years of Education: N/A   Occupational History  . Retired    Social History Main Topics  . Smoking status: Never Smoker   . Smokeless tobacco: Never Used  . Alcohol Use: No  . Drug Use: No  . Sexual Activity: Not on file   Other Topics Concern  . Not on file   Social History Narrative   Occupation: Textile   Married, [phone]   Patient has never smoked.    Alcohol Use - no   Illicit Drug Use - no   Patient does not get regular exercise.    Daily Caffeine Use: 3 cups of coffee daily    FAMILY HISTORY:   Family Status  Relation Status Death Age  . Mother Deceased     heart disease  . Brother Alive     diabetes  . Brother Deceased     heart disease, renal failure  . Father Deceased     hepatitis  . Sister Alive     diabetes  . Brother Deceased     drowned as a teenager  . Brother Alive     diabetes  . Brother Alive     diabetes  . Sister Alive     diabetes, liver disease (alcohol)  . Sister Alive     healthy  . Son Alive     sarcoidosis  . Daughter Alive     healthy  . Cousin Alive     Parkinsons disease    ROS:  A complete 10 system review of systems was obtained and was unremarkable apart from what is mentioned above.  PHYSICAL EXAMINATION:    VITALS:   Filed Vitals:   11/20/13 1022  BP: 110/58  Pulse: 88  Height: 5\' 4"  (1.626 m)  Weight: 132 lb (59.875 kg)    GEN:  The patient appears stated age and is in NAD. HEENT:  Normocephalic, atraumatic.  The mucous membranes are moist. The superficial temporal arteries are without ropiness or tenderness.   Neurological examination:  Orientation: The patient is alert and oriented x3.  Cranial nerves: There is good facial symmetry. There is facial hypomimia. The speech is fluent  and clear. Soft palate rises symmetrically and there is no tongue deviation. Hearing is intact to conversational tone. Sensation: Sensation is intact to light touch throughout Motor: Strength is 5/5 in  the bilateral upper and lower extremities.   Shoulder shrug is equal and symmetric.  There is no pronator drift. Deep tendon reflexes: Deep tendon reflexes are 3/4 at the bilateral biceps, triceps, brachioradialis, patella and achilles. Plantar responses are downgoing bilaterally.  Movement examination: Tone: There is normal tone in the bilateral upper extremities (improved) Abnormal movements: There is no tremor noted today Coordination:  There is minor decrease in RAM's on the L with finger taps Gait and Station: The patient has no difficulty arising out of a deep-seated chair without the use of the hands.  The patient's stride length is normal.  The patient has a negative pull test.      ASSESSMENT/PLAN:  1.  Idiopathic Parkinson's disease.  She was dx in May, 2015.    -She is doing well on carbidopa/levodopa 25/100 tid.    -encouraged continue to exercise.  Pt education provided.   2.  R shoulder pain, wrist and thumb pain  -sounds more arthritic and less radicular in nature.  Asked pt to f/u PCP. 3.  Return in about 4 months (around 03/23/2014).

## 2013-12-29 ENCOUNTER — Other Ambulatory Visit: Payer: Self-pay | Admitting: Neurology

## 2013-12-31 NOTE — Telephone Encounter (Signed)
Carbidopa Levodopa 25/100 refill requested. Per last office note- patient to remain on medication. Refill approved and sent to patient's pharmacy.   

## 2014-01-04 ENCOUNTER — Other Ambulatory Visit: Payer: Self-pay | Admitting: *Deleted

## 2014-01-04 NOTE — Telephone Encounter (Signed)
Electronic refill request. Last Filled:    60 tablet 5RF  On Jun 05, 2013  Please advise.

## 2014-01-06 MED ORDER — TRAMADOL HCL 50 MG PO TABS: ORAL_TABLET | ORAL | Status: DC

## 2014-01-06 NOTE — Telephone Encounter (Signed)
please call in.  Thanks.   

## 2014-01-07 NOTE — Telephone Encounter (Signed)
Medication phoned to pharmacy.  

## 2014-01-21 DIAGNOSIS — L820 Inflamed seborrheic keratosis: Secondary | ICD-10-CM | POA: Diagnosis not present

## 2014-01-21 DIAGNOSIS — Z09 Encounter for follow-up examination after completed treatment for conditions other than malignant neoplasm: Secondary | ICD-10-CM | POA: Diagnosis not present

## 2014-01-21 DIAGNOSIS — R233 Spontaneous ecchymoses: Secondary | ICD-10-CM | POA: Diagnosis not present

## 2014-01-21 DIAGNOSIS — Z872 Personal history of diseases of the skin and subcutaneous tissue: Secondary | ICD-10-CM | POA: Diagnosis not present

## 2014-01-24 DIAGNOSIS — Z853 Personal history of malignant neoplasm of breast: Secondary | ICD-10-CM | POA: Diagnosis not present

## 2014-01-28 ENCOUNTER — Encounter: Payer: Self-pay | Admitting: Family Medicine

## 2014-01-29 ENCOUNTER — Encounter: Payer: Self-pay | Admitting: *Deleted

## 2014-02-07 ENCOUNTER — Ambulatory Visit: Payer: Medicare Other

## 2014-02-07 ENCOUNTER — Ambulatory Visit: Payer: Medicare Other | Admitting: Physical Therapy

## 2014-02-07 ENCOUNTER — Ambulatory Visit: Payer: Medicare Other | Admitting: Occupational Therapy

## 2014-03-04 DIAGNOSIS — C50911 Malignant neoplasm of unspecified site of right female breast: Secondary | ICD-10-CM | POA: Diagnosis not present

## 2014-03-25 ENCOUNTER — Ambulatory Visit: Payer: Medicare Other | Admitting: Neurology

## 2014-03-26 ENCOUNTER — Ambulatory Visit: Payer: Medicare Other | Admitting: Neurology

## 2014-04-04 ENCOUNTER — Ambulatory Visit: Payer: Medicare Other | Admitting: Physical Therapy

## 2014-04-08 ENCOUNTER — Other Ambulatory Visit (HOSPITAL_BASED_OUTPATIENT_CLINIC_OR_DEPARTMENT_OTHER): Payer: Medicare Other

## 2014-04-08 ENCOUNTER — Ambulatory Visit (HOSPITAL_BASED_OUTPATIENT_CLINIC_OR_DEPARTMENT_OTHER): Payer: Medicare Other

## 2014-04-08 ENCOUNTER — Other Ambulatory Visit: Payer: Self-pay | Admitting: Internal Medicine

## 2014-04-08 ENCOUNTER — Ambulatory Visit: Payer: Medicare Other

## 2014-04-08 DIAGNOSIS — D472 Monoclonal gammopathy: Secondary | ICD-10-CM

## 2014-04-08 DIAGNOSIS — C50819 Malignant neoplasm of overlapping sites of unspecified female breast: Secondary | ICD-10-CM

## 2014-04-08 DIAGNOSIS — M810 Age-related osteoporosis without current pathological fracture: Secondary | ICD-10-CM | POA: Diagnosis not present

## 2014-04-08 DIAGNOSIS — C50911 Malignant neoplasm of unspecified site of right female breast: Secondary | ICD-10-CM

## 2014-04-08 LAB — COMPREHENSIVE METABOLIC PANEL (CC13)
ALT: 12 U/L (ref 0–55)
AST: 24 U/L (ref 5–34)
Albumin: 3.4 g/dL — ABNORMAL LOW (ref 3.5–5.0)
Alkaline Phosphatase: 33 U/L — ABNORMAL LOW (ref 40–150)
Anion Gap: 11 mEq/L (ref 3–11)
BUN: 8.6 mg/dL (ref 7.0–26.0)
CO2: 27 mEq/L (ref 22–29)
Calcium: 8.8 mg/dL (ref 8.4–10.4)
Chloride: 107 mEq/L (ref 98–109)
Creatinine: 0.8 mg/dL (ref 0.6–1.1)
EGFR: 79 mL/min/{1.73_m2} — ABNORMAL LOW (ref >90)
Glucose: 91 mg/dl (ref 70–140)
Potassium: 3.7 mEq/L (ref 3.5–5.1)
Sodium: 145 mEq/L (ref 136–145)
Total Bilirubin: 0.6 mg/dL (ref 0.20–1.20)
Total Protein: 6.4 g/dL (ref 6.4–8.3)

## 2014-04-08 LAB — CBC WITH DIFFERENTIAL/PLATELET
BASO%: 0.6 % (ref 0.0–2.0)
Basophils Absolute: 0 10*3/uL (ref 0.0–0.1)
EOS%: 4.3 % (ref 0.0–7.0)
Eosinophils Absolute: 0.3 10*3/uL (ref 0.0–0.5)
HCT: 37.4 % (ref 34.8–46.6)
HGB: 12.3 g/dL (ref 11.6–15.9)
LYMPH%: 27.1 % (ref 14.0–49.7)
MCH: 32.9 pg (ref 25.1–34.0)
MCHC: 32.9 g/dL (ref 31.5–36.0)
MCV: 100 fL (ref 79.5–101.0)
MONO#: 0.3 10*3/uL (ref 0.1–0.9)
MONO%: 4.9 % (ref 0.0–14.0)
NEUT#: 4.4 10*3/uL (ref 1.5–6.5)
NEUT%: 63.1 % (ref 38.4–76.8)
Platelets: 154 10*3/uL (ref 145–400)
RBC: 3.74 10*6/uL (ref 3.70–5.45)
RDW: 14.1 % (ref 11.2–14.5)
WBC: 7 10*3/uL (ref 3.9–10.3)
lymph#: 1.9 10*3/uL (ref 0.9–3.3)

## 2014-04-08 LAB — LACTATE DEHYDROGENASE (CC13): LDH: 183 U/L (ref 125–245)

## 2014-04-08 MED ORDER — DENOSUMAB 60 MG/ML ~~LOC~~ SOLN
60.0000 mg | Freq: Once | SUBCUTANEOUS | Status: AC
  Administered 2014-04-08: 60 mg via SUBCUTANEOUS
  Filled 2014-04-08: qty 1

## 2014-04-08 NOTE — Progress Notes (Signed)
[name] husband passed on 03/31/14.

## 2014-04-09 ENCOUNTER — Ambulatory Visit: Payer: Medicare Other | Attending: General Surgery | Admitting: Physical Therapy

## 2014-04-09 DIAGNOSIS — M25611 Stiffness of right shoulder, not elsewhere classified: Secondary | ICD-10-CM | POA: Insufficient documentation

## 2014-04-09 DIAGNOSIS — R52 Pain, unspecified: Secondary | ICD-10-CM | POA: Insufficient documentation

## 2014-04-09 DIAGNOSIS — R29898 Other symptoms and signs involving the musculoskeletal system: Secondary | ICD-10-CM | POA: Insufficient documentation

## 2014-04-09 NOTE — Therapy (Signed)
[place], [name], [address] Phone: [phone]   Fax:  [phone]  Physical Therapy Evaluation  Patient Details  Name: [name] MRN: [id number] Date of Birth: Jun 04, 1940 Referring Provider:  Rolm Bookbinder, MD  Encounter Date: 04/09/2014      PT End of Session - 04/09/14 1751    Visit Number 1   Number of Visits 9   Date for PT Re-Evaluation 05/09/14   PT Start Time 8144   PT Stop Time 1349   PT Time Calculation (min) 44 min   Activity Tolerance Patient tolerated treatment well   Behavior During Therapy --  Patient became tearful at one point; her husband passed away just 9 days ago.      Past Medical History  Diagnosis Date  . Esophageal Stricture   . Monoclonal gammopathy   . Anemia     Anemia-NOS / PMH, Dr Julien Nordmann  . Hyperlipidemia   . Vertical diplopia   . MVP (mitral valve prolapse)   . Cancer     stage I right breast  . Skin cancer     basal cell L neck  . Allergy     SEASONAL  . Arthritis   . Cataract     BILATERAL  . Osteoporosis     Past Surgical History  Procedure Laterality Date  . Total abdominal hysterectomy w/ bilateral salpingoophorectomy  74 yrs old    for pain,prolapse ; G3 P2  . Salpingoophorectomy  2002    for benign growths  . Hemorrhoid surgery  1970's  . Appendectomy  2002  . Cardiac catheterization  2005    neg  . Colonoscopy      negative   . Bone biopsy  11/06/10  . Breast lumpectomy  02/2010    right breast lumpectomy and sentinel node biopsy  . Upper gastrointestinal endoscopy      There were no vitals filed for this visit.  Visit Diagnosis:  Stiffness of joint, shoulder region, right - Plan: PT plan of care cert/re-cert  Weakness of right arm - Plan: PT plan of care cert/re-cert  Pain aggravated by changing postions - Plan: PT plan of care cert/re-cert      Subjective Assessment - 04/09/14 1310    Symptoms Can't use the  right arm like I should; can't hold a cup of coffee, can't raise arm up overhead for sleeping (hurts and wakes her up).  Limited with doing aerobics with right arm movements.    Pertinent History Right breast cancer diagnosed in 2012 with lumpectomy in early 2013; had three lymph nodes removed; had radiation treatment that finished summer 2013.  On tamoxifen.  Reports she was encouraged to come to therapy in 2013 but declined at that time due to the location of therapy clinic.  Diagnosed with Parkinson's last year and did therapy for that at our [facility] and classes..                                                     Patient Stated Goals Have some of the pain go away and have more use of the right arm.   Currently in Pain? Yes  not at rest, but with movement of arm   Pain Score 10-Worst pain ever   Pain Location Axilla   Pain Orientation  Right   Pain Descriptors / Indicators Aching   Pain Onset More than a month ago   Aggravating Factors  at night, with holding something   Pain Relieving Factors rest   Effect of Pain on Daily Activities affects sleep, can't lift things, can't raise arm overhead            [facility] PT Assessment - 04/09/14 0001    Assessment   Medical Diagnosis right breast cancer s/p lumpectomy   Precautions   Precautions Other (comment)  cancer precautions   Restrictions   Weight Bearing Restrictions No   Balance Screen   Has the patient fallen in the past 6 months No   Has the patient had a decrease in activity level because of a fear of falling?  No   Is the patient reluctant to leave their home because of a fear of falling?  No   Home Environment   Living Enviornment Private residence   Living Arrangements Alone  husband just died   Type of [place] to enter   Home Layout One level   Prior [place] Retired   Leisure Used to go to [employer] 5-6 days a week, but not since December until yesterday.   Observation/Other  Assessments   Observations Pt. reports Dr. Donne Hazel noted swelling at right side inferior to axilla   Skin Integrity intact; well-healed incisions at upper breast and right axilla   Other Surveys  Select   ROM / Strength   AROM / PROM / Strength AROM;PROM;Strength   AROM   AROM Assessment Site Shoulder   Right/Left Shoulder Right;Left   Right Shoulder Flexion 118 Degrees  pain   Right Shoulder ABduction 70 Degrees  pain   Right Shoulder Internal Rotation 80 Degrees  in supine   Right Shoulder External Rotation 60 Degrees  pain; in supine   Left Shoulder Flexion 139 Degrees   Left Shoulder ABduction 183 Degrees   Left Shoulder Internal Rotation 79 Degrees   Left Shoulder External Rotation 85 Degrees   PROM   PROM Assessment Site Shoulder   Right/Left Shoulder Right   Right Shoulder Flexion 140 Degrees   Right Shoulder ABduction 89 Degrees   Strength   Overall Strength Deficits   Overall Strength Comments Right shoulder grossly 3-/5, left grossly 5-/5; other UE strength grossly 5/5 bilat.           LYMPHEDEMA/ONCOLOGY QUESTIONNAIRE - 04/09/14 1338    Right Upper Extremity Lymphedema   15 cm Proximal to Olecranon Process 27.6 cm   10 cm Proximal to Olecranon Process 26.5 cm   Olecranon Process 21.7 cm   10 cm Proximal to Ulnar Styloid Process 18.8 cm   Just Proximal to Ulnar Styloid Process 14.7 cm   Across Hand at [employer] 17.1 cm   At [place] of 2nd Digit 5.7 cm   Left Upper Extremity Lymphedema   15 cm Proximal to Olecranon Process 27.2 cm   10 cm Proximal to Olecranon Process 27 cm   Olecranon Process 24.2 cm   10 cm Proximal to Ulnar Styloid Process 19 cm   Just Proximal to Ulnar Styloid Process 15.4 cm   Across Hand at [employer] 17.5 cm   At [place] of 2nd Digit 5.5 cm           Quick Dash - 04/09/14 0001    Open a tight or new jar Severe difficulty   Carry a shopping  bag or briefcase Moderate difficulty   Wash your back Severe difficulty   Use  a knife to cut food No difficulty   Recreational activities in which you take some force or impact through your arm, shoulder, or hand (golf, hammering, tennis) Severe difficulty   During the past week, to what extent has your arm, shoulder or hand problem interfered with your normal social activities with family, friends, neighbors, or groups? Modererately   During the past week, to what extent has your arm, shoulder or hand problem limited your work or other regular daily activities Modererately   Arm, shoulder, or hand pain. Moderate   Tingling (pins and needles) in your arm, shoulder, or hand None   Difficulty Sleeping Moderate difficulty   DASH Score 40.91 %                             Long Term Clinic Goals - April 11, 2014 1757    CC Long Term Goal  #1   Title patient will be independent in home exercise program for right shoulder ROM and UE strengthening   Time 4   Period Weeks   Status New   CC Long Term Goal  #2   Title Right shoulder active flexion to at least 135 degrees for improved overhead reach.   Time 4   Period Weeks   Status New   CC Long Term Goal  #3   Title Right shoulder active abduction to at least 140 degrees for improved ADLs.   Time 4   Period Weeks   Status New   CC Long Term Goal  #4   Title Pt. will report decrease in right shoulder pain with movement of at least 60%.   Time 4   Period Weeks   Status New            Plan - Apr 11, 2014 1754    Clinical Impression Statement Patient two years s/p lumpectomy for right breast cancer with significant limitation in right shoulder ROM, strength, and function, and with pain on movement.   Pt will benefit from skilled therapeutic intervention in order to improve on the following deficits Pain;Decreased range of motion;Impaired UE functional use;Decreased strength   Rehab Potential Excellent   PT Frequency 2x / week   PT Duration 4 weeks   PT Treatment/Interventions Manual techniques;Passive  range of motion;Therapeutic exercise;Patient/family education   PT Next Visit Plan Begin home exercise program instruction and PROM for right shoulder; progress to strengthening as able; myofascial release.   Consulted and Agree with Plan of Care Patient          G-Codes - April 11, 2014 1800    Functional Assessment Tool Used quick dash   Functional Limitation Carrying, moving and handling objects   Carrying, Moving and Handling Objects Current Status [phone]) At least 40 percent but less than 60 percent impaired, limited or restricted   Carrying, Moving and Handling Objects Goal Status ([id number]) At least 1 percent but less than 20 percent impaired, limited or restricted       Problem List Patient Active Problem List   Diagnosis Date Noted  . PD (Parkinson's disease) 06/05/2013  . Tremor 05/18/2013  . Irritation of ear 05/18/2013  . Medicare annual wellness visit, initial 05/02/2012  . Neck pain 05/02/2012  . Osteoporosis 03/08/2011  . Neck mass 03/08/2011  . Breast cancer 11/19/2010  . DYSPEPSIA&OTHER SPEC DISORDERS FUNCTION STOMACH 08/01/2008  . DYSPHAGIA UNSPECIFIED 08/01/2008  .  ORTHOSTATIC HYPOTENSION 06/28/2008  . GOITER, UNSPECIFIED 07/10/2007  . HYPERLIPIDEMIA 07/10/2007  . MONOCLONAL GAMMOPATHY 07/10/2007  . ANEMIA-NOS 07/10/2007  . FATIGUE 07/10/2007    Satoru Milich 04/09/2014, 6:02 PM  [place], [name], [address] Phone: [phone]   Fax:  [place], PT 04/09/2014 6:03 PM

## 2014-04-10 ENCOUNTER — Telehealth: Payer: Self-pay | Admitting: Family Medicine

## 2014-04-10 ENCOUNTER — Encounter: Payer: Self-pay | Admitting: Family Medicine

## 2014-04-10 ENCOUNTER — Encounter: Payer: Self-pay | Admitting: Neurology

## 2014-04-10 ENCOUNTER — Ambulatory Visit (INDEPENDENT_AMBULATORY_CARE_PROVIDER_SITE_OTHER): Payer: Medicare Other | Admitting: Neurology

## 2014-04-10 VITALS — BP 108/62 | HR 92 | Ht 65.0 in | Wt 124.0 lb

## 2014-04-10 DIAGNOSIS — G20 Parkinson's disease: Secondary | ICD-10-CM | POA: Diagnosis not present

## 2014-04-10 LAB — SPEP & IFE WITH QIG
Albumin ELP: 54.7 % — ABNORMAL LOW (ref 55.8–66.1)
Alpha-1-Globulin: 4.2 % (ref 2.9–4.9)
Alpha-2-Globulin: 8.1 % (ref 7.1–11.8)
Beta 2: 20.4 % — ABNORMAL HIGH (ref 3.2–6.5)
Beta Globulin: 5.6 % (ref 4.7–7.2)
Gamma Globulin: 7 % — ABNORMAL LOW (ref 11.1–18.8)
IgA: 34 mg/dL — ABNORMAL LOW (ref 69–380)
IgG (Immunoglobin G), Serum: 1640 mg/dL (ref 690–1700)
IgM, Serum: 25 mg/dL — ABNORMAL LOW (ref 52–322)
M-Spike, %: 0.96 g/dL
Total Protein, Serum Electrophoresis: 6.8 g/dL (ref 6.0–8.3)

## 2014-04-10 LAB — KAPPA/LAMBDA LIGHT CHAINS
Kappa free light chain: 6.87 mg/dL — ABNORMAL HIGH (ref 0.33–1.94)
Kappa:Lambda Ratio: 8.81 — ABNORMAL HIGH (ref 0.26–1.65)
Lambda Free Lght Chn: 0.78 mg/dL (ref 0.57–2.63)

## 2014-04-10 LAB — CANCER ANTIGEN 27.29: CA 27.29: 13 U/mL (ref 0–39)

## 2014-04-10 LAB — BETA 2 MICROGLOBULIN, SERUM: Beta-2 Microglobulin: 1.88 mg/L (ref <=2.51)

## 2014-04-10 NOTE — Progress Notes (Signed)
[name] was seen today in the movement disorders clinic for neurologic consultation at the request of [name], MD.  The consultation is for the evaluation of tremor.  The records that were made available to me were reviewed.  No one accompanies the patient.   The first symptom(s) the patient noticed was unilateral hand tremor on the L and this was 4-5 months ago.  She is R hand dominant.  She states that it was intermittent when it started but now it is more constant.  No tremor elsewhere.    Only living relative with PD is a cousin, who is currently being treated at [facility].  08/17/13 update:  Pt states that tremor is improved on levodopa but not sure if it is otherwise helping (but may be).  Takes med at 8am/1pm/5-7pm.  No SE.  Blood pressure has been low but states that it is always low.  No lightheadedness.  Balance is not good, but no falls.  She completed therapy.  Is exercising at home.  She walks an hr a day 6 days per week and when she goes to the gym, she will use the treadmill.  Has signed up for PD exercise class.   11/20/13 update:  Pt f/u re: PD.  Is on carbidopa/levodopa 25/100 tid.  Is doing well.  No falls.  No lightheadedness.  No hallucinations.  Exercising faithfully.   Is having an aching pain in the shoulder and in the thumb region on the R side but PD primarily affects her on the L.  Some occasional tremor on the L.    04/09/13 update:  Pt is on carbidopa/levodopa 25/100 tid.  She is stable in regards to PD but unfortunately her husband just died 73 days ago.  She just started back exercising with her sister in law.  No SI/HI.  No falls.  No lightheadeness.  Some tremor but no more than usual.  Neuroimaging has not previously been performed.  It was supposed to be done 06/01/13 but pt cancelled as she was claustrophobic and couldn't do the scan even though it was in an open unit.   PREVIOUS MEDICATIONS: none to date  ALLERGIES:   Allergies  Allergen Reactions    . Penicillins Shortness Of Breath and Itching    CURRENT MEDICATIONS:  Current Outpatient Prescriptions on File Prior to Visit  Medication Sig Dispense Refill  . aspirin 81 MG tablet Take 81 mg by mouth daily.      [name] b complex vitamins tablet Take 1 tablet by mouth daily.    . Calcium 1500 MG tablet Take 1,500 mg by mouth.      . carbidopa-levodopa (SINEMET IR) 25-100 MG per tablet TAKE ONE TABLET BY MOUTH THREE TIMES DAILY 90 tablet 5  . cholecalciferol (VITAMIN D) 1000 UNITS tablet Take 5,000 Units by mouth daily.      . Denosumab (PROLIA [place]) Inject into the skin every 6 (six) months.    . Multiple Vitamin (MULTIVITAMIN) capsule 2 tabs po qd     . Omega-3 Fatty Acids (OMEGA 3 PO) 1 tab po qd     . tamoxifen (NOLVADEX) 20 MG tablet TAKE ONE TABLET BY MOUTH ONCE DAILY 30 tablet 5  . traMADol (ULTRAM) 50 MG tablet TAKE ONE TO TWO TABLETS BY MOUTH AT BEDTIME AS NEEDED FOR PAIN 60 tablet 5  . vitamin B-12 (CYANOCOBALAMIN) 500 MCG tablet Take 2500 mcg daily.    . nitroGLYCERIN (NITROSTAT) 0.3 MG SL tablet Place 0.3  mg under the tongue every 5 (five) minutes as needed.       No current facility-administered medications on file prior to visit.    PAST MEDICAL HISTORY:   Past Medical History  Diagnosis Date  . Esophageal Stricture   . Monoclonal gammopathy   . Anemia     Anemia-NOS / PMH, Dr Julien Nordmann  . Hyperlipidemia   . Vertical diplopia   . MVP (mitral valve prolapse)   . Cancer     stage I right breast  . Skin cancer     basal cell L neck  . Allergy     SEASONAL  . Arthritis   . Cataract     BILATERAL  . Osteoporosis     PAST SURGICAL HISTORY:   Past Surgical History  Procedure Laterality Date  . Total abdominal hysterectomy w/ bilateral salpingoophorectomy  74 yrs old    for pain,prolapse ; G3 P2  . Salpingoophorectomy  2002    for benign growths  . Hemorrhoid surgery  1970's  . Appendectomy  2002  . Cardiac catheterization  2005    neg  . Colonoscopy       negative   . Bone biopsy  11/06/10  . Breast lumpectomy  02/2010    right breast lumpectomy and sentinel node biopsy  . Upper gastrointestinal endoscopy      SOCIAL HISTORY:   History   Social History  . Marital Status: Married    Spouse Name: N/A  . Number of Children: 2  . Years of Education: N/A   Occupational History  . Retired    Social History Main Topics  . Smoking status: Never Smoker   . Smokeless tobacco: Never Used  . Alcohol Use: No  . Drug Use: No  . Sexual Activity: Not on file   Other Topics Concern  . Not on file   Social History Narrative   Occupation: Textile   Married, [phone]   Patient has never smoked.    Alcohol Use - no   Illicit Drug Use - no   Patient does not get regular exercise.    Daily Caffeine Use: 3 cups of coffee daily    FAMILY HISTORY:   Family Status  Relation Status Death Age  . Mother Deceased     heart disease  . Brother Alive     diabetes  . Brother Deceased     heart disease, renal failure  . Father Deceased     hepatitis  . Sister Alive     diabetes  . Brother Deceased     drowned as a teenager  . Brother Alive     diabetes  . Brother Alive     diabetes  . Sister Alive     diabetes, liver disease (alcohol)  . Sister Alive     healthy  . Son Alive     sarcoidosis  . Daughter Alive     healthy  . Cousin Alive     Parkinsons disease    ROS:  A complete 10 system review of systems was obtained and was unremarkable apart from what is mentioned above.  PHYSICAL EXAMINATION:    VITALS:   Filed Vitals:   04/10/14 1017  BP: 108/62  Pulse: 92  Height: 5\' 5"  (1.651 m)  Weight: 124 lb (56.246 kg)    GEN:  The patient appears stated age and is in NAD.  Tearful at times HEENT:  Normocephalic, atraumatic.  The mucous membranes are moist. The superficial  temporal arteries are without ropiness or tenderness. CV:  RRR Lungs:  CTAB Neck:  No bruits   Neurological examination:  Orientation: The patient is  alert and oriented x3.  Cranial nerves: There is good facial symmetry. There is facial hypomimia. The speech is fluent and clear. Soft palate rises symmetrically and there is no tongue deviation. Hearing is intact to conversational tone. Sensation: Sensation is intact to light touch throughout Motor: Strength is 5/5 in the bilateral upper and lower extremities.   Shoulder shrug is equal and symmetric.  There is no pronator drift. Deep tendon reflexes: Deep tendon reflexes are 3/4 at the bilateral biceps, triceps, brachioradialis, patella and achilles. Plantar responses are downgoing bilaterally.  Movement examination: Tone: There is normal tone in the bilateral upper extremities (improved) Abnormal movements: There is no tremor noted today Coordination:  There is minor decrease in RAM's on the L with finger taps Gait and Station: The patient has no difficulty arising out of a deep-seated chair without the use of the hands.  The patient's stride length is normal.  The patient has a negative pull test.      ASSESSMENT/PLAN:  1.  Idiopathic Parkinson's disease.  She was dx in May, 2015.    -She is doing well on carbidopa/levodopa 25/100 tid.    -encouraged continue to exercise.  Pt education provided.   2.  Situational depression  -husband just died 10 days ago.  Doesn't want meds and doing really well.  Has family that is supportive in the area. 3.  Return in about 4 months (around 08/10/2014).

## 2014-04-10 NOTE — Telephone Encounter (Signed)
Called and LMOVM for patient.  Her husband had died.  I left message with my condolences.

## 2014-04-11 ENCOUNTER — Ambulatory Visit: Payer: Medicare Other

## 2014-04-11 DIAGNOSIS — R29898 Other symptoms and signs involving the musculoskeletal system: Secondary | ICD-10-CM | POA: Diagnosis not present

## 2014-04-11 DIAGNOSIS — R52 Pain, unspecified: Secondary | ICD-10-CM | POA: Diagnosis not present

## 2014-04-11 DIAGNOSIS — M25611 Stiffness of right shoulder, not elsewhere classified: Secondary | ICD-10-CM | POA: Diagnosis not present

## 2014-04-11 NOTE — Therapy (Signed)
[place], [name], [address] Phone: [phone]   Fax:  [phone]  Physical Therapy Treatment  Patient Details  Name: [name] MRN: [id number] Date of Birth: 12/04/1940 Referring Provider:  Tonia Ghent, MD  Encounter Date: 04/11/2014      PT End of Session - 04/11/14 1432    Visit Number 2   Number of Visits 9   Date for PT Re-Evaluation 05/09/14   PT Start Time 1346   PT Stop Time 1431   PT Time Calculation (min) 45 min      Past Medical History  Diagnosis Date  . Esophageal Stricture   . Monoclonal gammopathy   . Anemia     Anemia-NOS / PMH, Dr Julien Nordmann  . Hyperlipidemia   . Vertical diplopia   . MVP (mitral valve prolapse)   . Cancer     stage I right breast  . Skin cancer     basal cell L neck  . Allergy     SEASONAL  . Arthritis   . Cataract     BILATERAL  . Osteoporosis     Past Surgical History  Procedure Laterality Date  . Total abdominal hysterectomy w/ bilateral salpingoophorectomy  74 yrs old    for pain,prolapse ; G3 P2  . Salpingoophorectomy  2002    for benign growths  . Hemorrhoid surgery  1970's  . Appendectomy  2002  . Cardiac catheterization  2005    neg  . Colonoscopy      negative   . Bone biopsy  11/06/10  . Breast lumpectomy  02/2010    right breast lumpectomy and sentinel node biopsy  . Upper gastrointestinal endoscopy      There were no vitals filed for this visit.  Visit Diagnosis:  Stiffness of joint, shoulder region, right  Weakness of right arm  Pain aggravated by changing postions      Subjective Assessment - 04/11/14 1349    Symptoms My Rt shoulder hurts, havent been doing as much with that arm.   Currently in Pain? Yes   Pain Score 7    Pain Location Shoulder   Pain Orientation Right   Pain Descriptors / Indicators Aching                       [facility] Adult PT Treatment/Exercise - 04/11/14 0001    Shoulder Exercises: ROM/Strengthening   Other ROM/Strengthening Exercises Standing cane exercises into Rt shoulder flexion, abduction, and external rotation all to pts tolernace. 5 reps for 5 second holds.   Manual Therapy   Myofascial Release In supine to Rt chest wall with cross hands technique horizontally and diagnolly during PROM   Passive ROM In Supine to Rt shoulder into flexion, abduction, and external rotation all to pts tolernace.                PT Education - 04/11/14 1407    Education provided Yes   Education Details Cane exercises   Person(s) Educated Patient   Methods Explanation;Demonstration;Verbal cues;Handout   Comprehension Verbalized understanding;Returned demonstration;Need further instruction                [facility] Goals - 04/09/14 1757    CC Long Term Goal  #1   Title patient will be independent in home exercise program for right shoulder ROM and UE strengthening   Time 4   Period Weeks   Status New  CC Long Term Goal  #2   Title Right shoulder active flexion to at least 135 degrees for improved overhead reach.   Time 4   Period Weeks   Status New   CC Long Term Goal  #3   Title Right shoulder active abduction to at least 140 degrees for improved ADLs.   Time 4   Period Weeks   Status New   CC Long Term Goal  #4   Title Pt. will report decrease in right shoulder pain with movement of at least 60%.   Time 4   Period Weeks   Status New            Plan - 04/11/14 1432    Clinical Impression Statement Pt tolerated treatemnt very well today. She had some pain with standing cane exercises but was able to perform with correct technique and verbalized understanding to let pain be her guide. She did well with PROM with no increase in pain. Discussed possibility of pt transferring to [facility] as this is closer to her but for now she would like to stay at this clinic.   Pt will benefit from skilled therapeutic intervention in  order to improve on the following deficits Pain;Decreased range of motion;Impaired UE functional use;Decreased strength   Rehab Potential Excellent   PT Frequency 2x / week   PT Duration 4 weeks   PT Treatment/Interventions Manual techniques;Passive range of motion;Therapeutic exercise;Patient/family education   PT Next Visit Plan Review home exercise program instruction, continue PROM for right shoulder; progress to strengthening as able; myofascial release.   Consulted and Agree with Plan of Care Patient        Problem List Patient Active Problem List   Diagnosis Date Noted  . PD (Parkinson's disease) 06/05/2013  . Tremor 05/18/2013  . Irritation of ear 05/18/2013  . Medicare annual wellness visit, initial 05/02/2012  . Neck pain 05/02/2012  . Osteoporosis 03/08/2011  . Neck mass 03/08/2011  . Breast cancer 11/19/2010  . DYSPEPSIA&OTHER SPEC DISORDERS FUNCTION STOMACH 08/01/2008  . DYSPHAGIA UNSPECIFIED 08/01/2008  . ORTHOSTATIC HYPOTENSION 06/28/2008  . GOITER, UNSPECIFIED 07/10/2007  . HYPERLIPIDEMIA 07/10/2007  . MONOCLONAL GAMMOPATHY 07/10/2007  . ANEMIA-NOS 07/10/2007  . FATIGUE 07/10/2007    Otelia Limes, PTA 04/11/2014, 2:36 PM  [place], [name], [address] Phone: [phone]   Fax:  [phone]

## 2014-04-11 NOTE — Patient Instructions (Signed)
Flexion (Eccentric) - Active-Assist (Cane)   Use unaffected arm to push affected arm forward. Avoid hiking shoulder. Keep palm relaxed. Hold for 5 seconds. Let pain be your guide!! _5-8__ reps per set, _2-3__ sets per day.  Copyright  VHI. All rights reserved.  Cane Exercise: Abduction   Hold cane with right hand over end, palm-up, with other hand palm-down. Move arm out from side and up by pushing with other arm.Do not hike shoulder. Hold __5__ seconds. Repeat __5-8__ times. Do _2-3___ sessions per day.  http://gt2.exer.us/82   Copyright  VHI. All rights reserved.  SHOULDER: External Rotation - Supine (Cane)   Hold cane with both hands. Rotate arm away from body. Keep elbow next to body. _5-8__ reps per set, _2-3__ sets per day. Can add towel to keep elbow at side.  Copyright  VHI. All rights reserved.

## 2014-04-15 ENCOUNTER — Telehealth: Payer: Self-pay | Admitting: Internal Medicine

## 2014-04-15 ENCOUNTER — Ambulatory Visit (HOSPITAL_BASED_OUTPATIENT_CLINIC_OR_DEPARTMENT_OTHER): Payer: Medicare Other | Admitting: Internal Medicine

## 2014-04-15 ENCOUNTER — Encounter: Payer: Self-pay | Admitting: Internal Medicine

## 2014-04-15 ENCOUNTER — Encounter: Payer: Self-pay | Admitting: Physical Therapy

## 2014-04-15 ENCOUNTER — Ambulatory Visit: Payer: Medicare Other | Admitting: Physical Therapy

## 2014-04-15 VITALS — BP 145/69 | HR 84 | Temp 98.4°F | Resp 18 | Ht 65.0 in | Wt 123.6 lb

## 2014-04-15 DIAGNOSIS — C50911 Malignant neoplasm of unspecified site of right female breast: Secondary | ICD-10-CM

## 2014-04-15 DIAGNOSIS — M25611 Stiffness of right shoulder, not elsewhere classified: Secondary | ICD-10-CM | POA: Diagnosis not present

## 2014-04-15 DIAGNOSIS — D472 Monoclonal gammopathy: Secondary | ICD-10-CM | POA: Diagnosis not present

## 2014-04-15 DIAGNOSIS — R29898 Other symptoms and signs involving the musculoskeletal system: Secondary | ICD-10-CM | POA: Diagnosis not present

## 2014-04-15 DIAGNOSIS — R52 Pain, unspecified: Secondary | ICD-10-CM | POA: Diagnosis not present

## 2014-04-15 NOTE — Therapy (Signed)
[place], [name], [address] Phone: [phone]   Fax:  [phone]  Physical Therapy Treatment  Patient Details  Name: [name] MRN: [id number] Date of Birth: Apr 08, 1940 Referring Provider:  Tonia Ghent, MD  Encounter Date: 04/15/2014      PT End of Session - 04/15/14 1515    Visit Number 3   Number of Visits 9   Date for PT Re-Evaluation 05/09/14   PT Start Time 3662   PT Stop Time 1515   PT Time Calculation (min) 43 min   Activity Tolerance Patient tolerated treatment well   Behavior During Therapy Flat affect;WFL for tasks assessed/performed  Due to Parkinson's; also tearful talking about recent loss of her husband      Past Medical History  Diagnosis Date  . Esophageal Stricture   . Monoclonal gammopathy   . Anemia     Anemia-NOS / PMH, Dr Julien Nordmann  . Hyperlipidemia   . Vertical diplopia   . MVP (mitral valve prolapse)   . Cancer     stage I right breast  . Skin cancer     basal cell L neck  . Allergy     SEASONAL  . Arthritis   . Cataract     BILATERAL  . Osteoporosis     Past Surgical History  Procedure Laterality Date  . Total abdominal hysterectomy w/ bilateral salpingoophorectomy  74 yrs old    for pain,prolapse ; G3 P2  . Salpingoophorectomy  2002    for benign growths  . Hemorrhoid surgery  1970's  . Appendectomy  2002  . Cardiac catheterization  2005    neg  . Colonoscopy      negative   . Bone biopsy  11/06/10  . Breast lumpectomy  02/2010    right breast lumpectomy and sentinel node biopsy  . Upper gastrointestinal endoscopy      There were no vitals filed for this visit.  Visit Diagnosis:  Stiffness of joint, shoulder region, right  Pain aggravated by changing postions      Subjective Assessment - 04/15/14 1439    Symptoms My right shoulder hurts every day.  It seems like I can raise my arm a little higer since starting therapy.   Currently in Pain? Yes   Pain Score 4    Pain Location Shoulder   Pain Orientation Right   Pain Descriptors / Indicators Aching   Pain Type Chronic pain                       [facility] Adult PT Treatment/Exercise - 04/15/14 0001    Shoulder Exercises: Pulleys   Flexion 3 minutes   ABduction 3 minutes   ABduction Limitations Limited by pain   Shoulder Exercises: Therapy Ball   Flexion 10 reps   ABduction 10 reps   Shoulder Exercises: ROM/Strengthening   "W" Arms Standing against wall 3x10 second holds to tolerance   Manual Therapy   Myofascial Release In supine, upper medial arm.   Passive ROM In Supine to Rt shoulder into flexion, abduction, and external rotation all to pts tolernace.                PT Education - 04/15/14 1514    Education provided Yes   Education Details Shoulder abduction on wall   Person(s) Educated Patient   Methods Explanation;Demonstration;Handout   Comprehension Verbalized understanding;Returned demonstration  [facility] Goals - 04/09/14 1757    CC Long Term Goal  #1   Title patient will be independent in home exercise program for right shoulder ROM and UE strengthening   Time 4   Period Weeks   Status New   CC Long Term Goal  #2   Title Right shoulder active flexion to at least 135 degrees for improved overhead reach.   Time 4   Period Weeks   Status New   CC Long Term Goal  #3   Title Right shoulder active abduction to at least 140 degrees for improved ADLs.   Time 4   Period Weeks   Status New   CC Long Term Goal  #4   Title Pt. will report decrease in right shoulder pain with movement of at least 60%.   Time 4   Period Weeks   Status New            Plan - 04/15/14 1516    Clinical Impression Statement Pt with good shoulder ROM by end of session.  Pain limited her initially doing exercises but then as ROM increased, pain lessened.   Pt will benefit from skilled therapeutic  intervention in order to improve on the following deficits Pain;Decreased range of motion;Impaired UE functional use;Decreased strength   Rehab Potential Excellent   PT Frequency 2x / week   PT Duration 4 weeks   PT Treatment/Interventions Manual techniques;Passive range of motion;Therapeutic exercise;Patient/family education   PT Next Visit Plan Continue with ROM exercises and PROM.  Remeasure shoulder ROM.   Consulted and Agree with Plan of Care Patient        Problem List Patient Active Problem List   Diagnosis Date Noted  . PD (Parkinson's disease) 06/05/2013  . Tremor 05/18/2013  . Irritation of ear 05/18/2013  . Medicare annual wellness visit, initial 05/02/2012  . Neck pain 05/02/2012  . Osteoporosis 03/08/2011  . Neck mass 03/08/2011  . Breast cancer 11/19/2010  . DYSPEPSIA&OTHER SPEC DISORDERS FUNCTION STOMACH 08/01/2008  . DYSPHAGIA UNSPECIFIED 08/01/2008  . ORTHOSTATIC HYPOTENSION 06/28/2008  . GOITER, UNSPECIFIED 07/10/2007  . HYPERLIPIDEMIA 07/10/2007  . MONOCLONAL GAMMOPATHY 07/10/2007  . ANEMIA-NOS 07/10/2007  . FATIGUE 07/10/2007    Annia Friendly, PT 04/15/2014, 3:19 PM  [place], [name], [address] Phone: [phone]   Fax:  [phone]

## 2014-04-15 NOTE — Patient Instructions (Signed)
Closed Chain: Shoulder Abduction / Adduction - on Wall   One hand on wall, step to side and return. Stepping causes shoulder to abduct and adduct. Step _5__ times, hold 5 seconds, __3_ times per day.  http://ss.exer.us/267   Copyright  VHI. All rights reserved.

## 2014-04-15 NOTE — Telephone Encounter (Signed)
gv and printed appts sched and avs for pt for SEpt.Marland KitchenMarland Kitchen

## 2014-04-15 NOTE — Progress Notes (Signed)
[place] Telephone:([phone]   Fax:(336) [phone]  OFFICE PROGRESS NOTE  [name], MD [name] [address]  DIAGNOSIS:  1) Node-negative breast cancer status post completion of radiation 05/06/2010 currently on tamoxifen as well as prolia.  2) Monoclonal gammopathy of unknown significance on observation since 2007   PRIOR THERAPY:  1) status post right lumpectomy on 03/05/2010 and it showed invasive ductal carcinoma 0.9 CM, Positive for ER/PR and negative for HER-2, with ductal carcinoma in situ present and negative sentinel lymph node biopsies.  2) status post adjuvant radiotherapy completed in April of 2012   CURRENT THERAPY:  1) tamoxifen 20 mg by mouth daily started in 2012.  2) Prolia subcutaneous injection every 6 months.  INTERVAL HISTORY: [name] 74 y.o. female returns to the clinic today for 6 months followup visit. She unfortunately lost her husband recently to her lung cancer. She is tolerating her treatment with tamoxifen fairly well. The patient is feeling fine today with no specific complaints except for right shoulder pain with crackles in that area with movement. She denied having any significant weight loss or night sweats. The patient has no nausea or vomiting. She denied having any significant chest pain, shortness of breath, cough or hemoptysis.  She had repeat myeloma panel performed as well as CA 27.29 recently and she is here for evaluation and discussion of her lab results.   MEDICAL HISTORY: Past Medical History  Diagnosis Date  . Esophageal Stricture   . Monoclonal gammopathy   . Anemia     Anemia-NOS / PMH, Dr Julien Nordmann  . Hyperlipidemia   . Vertical diplopia   . MVP (mitral valve prolapse)   . Cancer     stage I right breast  . Skin cancer     basal cell L neck  . Allergy     SEASONAL  . Arthritis   . Cataract     BILATERAL  . Osteoporosis     ALLERGIES:  is allergic to  penicillins.  MEDICATIONS:  Current Outpatient Prescriptions  Medication Sig Dispense Refill  . aspirin 81 MG tablet Take 81 mg by mouth daily.      [name] b complex vitamins tablet Take 1 tablet by mouth daily.    . Calcium 1500 MG tablet Take 1,500 mg by mouth.      . carbidopa-levodopa (SINEMET IR) 25-100 MG per tablet TAKE ONE TABLET BY MOUTH THREE TIMES DAILY 90 tablet 5  . cholecalciferol (VITAMIN D) 1000 UNITS tablet Take 5,000 Units by mouth daily.      . Denosumab (PROLIA [place]) Inject into the skin every 6 (six) months.    . Multiple Vitamin (MULTIVITAMIN) capsule 2 tabs po qd     . Omega-3 Fatty Acids (OMEGA 3 PO) 1 tab po qd     . tamoxifen (NOLVADEX) 20 MG tablet TAKE ONE TABLET BY MOUTH ONCE DAILY 30 tablet 5  . traMADol (ULTRAM) 50 MG tablet TAKE ONE TO TWO TABLETS BY MOUTH AT BEDTIME AS NEEDED FOR PAIN 60 tablet 5  . vitamin B-12 (CYANOCOBALAMIN) 500 MCG tablet Take 2500 mcg daily.    . nitroGLYCERIN (NITROSTAT) 0.3 MG SL tablet Place 0.3 mg under the tongue every 5 (five) minutes as needed.       No current facility-administered medications for this visit.    SURGICAL HISTORY:  Past Surgical History  Procedure Laterality Date  . Total abdominal hysterectomy w/ bilateral salpingoophorectomy  74 yrs old    for pain,prolapse ; G3 P2  . Salpingoophorectomy  2002    for benign growths  . Hemorrhoid surgery  1970's  . Appendectomy  2002  . Cardiac catheterization  2005    neg  . Colonoscopy      negative   . Bone biopsy  11/06/10  . Breast lumpectomy  02/2010    right breast lumpectomy and sentinel node biopsy  . Upper gastrointestinal endoscopy      REVIEW OF SYSTEMS:  A comprehensive review of systems was negative except for: Musculoskeletal: positive for arthralgias   PHYSICAL EXAMINATION: General appearance: alert, cooperative and no distress Head: Normocephalic, without obvious abnormality, atraumatic Neck: no adenopathy, no JVD, supple, symmetrical, trachea midline  and thyroid not enlarged, symmetric, no tenderness/mass/nodules Lymph nodes: Cervical, supraclavicular, and axillary nodes normal. Resp: clear to auscultation bilaterally Cardio: regular rate and rhythm, S1, S2 normal, no murmur, click, rub or gallop GI: soft, non-tender; bowel sounds normal; no masses,  no organomegaly Extremities: extremities normal, atraumatic, no cyanosis or edema Breast exam showed no palpable masses in the breast or axilla bilaterally.  ECOG PERFORMANCE STATUS: 0 - Asymptomatic  Blood pressure 145/69, pulse 84, temperature 98.4 F (36.9 C), temperature source Oral, resp. rate 18, height 5' 5" (1.651 m), weight 123 lb 9.6 oz (56.065 kg), SpO2 100 %.  LABORATORY DATA: Lab Results  Component Value Date   WBC 7.0 04/08/2014   HGB 12.3 04/08/2014   HCT 37.4 04/08/2014   MCV 100.0 04/08/2014   PLT 154 04/08/2014      Chemistry      Component Value Date/Time   NA 145 04/08/2014 1048   NA 142 06/15/2011 1327   K 3.7 04/08/2014 1048   K 4.2 06/15/2011 1327   CL 107 03/21/2012 1501   CL 108 06/15/2011 1327   CO2 27 04/08/2014 1048   CO2 26 06/15/2011 1327   BUN 8.6 04/08/2014 1048   BUN 9 06/15/2011 1327   CREATININE 0.8 04/08/2014 1048   CREATININE 0.85 06/15/2011 1327      Component Value Date/Time   CALCIUM 8.8 04/08/2014 1048   CALCIUM 8.9 06/15/2011 1327   ALKPHOS 33* 04/08/2014 1048   ALKPHOS 44 06/15/2011 1327   AST 24 04/08/2014 1048   AST 25 06/15/2011 1327   ALT 12 04/08/2014 1048   ALT 14 06/15/2011 1327   BILITOT 0.60 04/08/2014 1048   BILITOT 0.3 06/15/2011 1327       RADIOGRAPHIC STUDIES: No results found.  ASSESSMENT AND PLAN: This is a very pleasant 74 years old white female with history of monoclonal gammopathy of undetermined significance and has been observation since 2007 in addition to diagnosis of node-negative breast cancer in 2012 and currently on treatment with tamoxifen as well as Prolia every 6 months. The patient is  doing fine and she has no evidence for disease progression on his recent bloodwork. For the MGUS, I recommended for her to continue on observation with repeat myeloma panel in 6 months. For the breast cancer, She will complete 5 years of treatment with tamoxifen before consideration of switch to aromatase inhibitor. She would have repeat CA 27.29 in 6 months. The patient will continue with her routine screening mammogram as a scheduled. She was advised to call immediately if she has any concerning symptoms in the interval.  The patient voices understanding of current disease status and treatment options and is in agreement with the current care plan.  All questions were  answered. The patient knows to call the clinic with any problems, questions or concerns. We can certainly see the patient much sooner if necessary.  Disclaimer: This note was dictated with voice recognition software. Similar sounding words can inadvertently be transcribed and may not be corrected upon review.

## 2014-04-16 ENCOUNTER — Ambulatory Visit (INDEPENDENT_AMBULATORY_CARE_PROVIDER_SITE_OTHER)
Admission: RE | Admit: 2014-04-16 | Discharge: 2014-04-16 | Disposition: A | Discharge status: Home / Self Care | Payer: Medicare Other | Source: Ambulatory Visit | Attending: Family Medicine | Admitting: Family Medicine

## 2014-04-16 ENCOUNTER — Ambulatory Visit (INDEPENDENT_AMBULATORY_CARE_PROVIDER_SITE_OTHER): Payer: Medicare Other | Admitting: Family Medicine

## 2014-04-16 ENCOUNTER — Encounter: Payer: Self-pay | Admitting: Family Medicine

## 2014-04-16 VITALS — BP 142/74 | HR 89 | Temp 98.0°F | Wt 123.5 lb

## 2014-04-16 DIAGNOSIS — M25511 Pain in right shoulder: Secondary | ICD-10-CM

## 2014-04-16 NOTE — Patient Instructions (Signed)
Go to the lab on the way out.  We'll contact you with your xray report. Rosaria Ferries will call about your referral. Take tramadol for pain in the meantime.   Work on arm swings to keep your shoulder loose and use the back exercises to work on your posture.  Take care.  Glad to see you.

## 2014-04-16 NOTE — Progress Notes (Signed)
Pre visit review using our clinic review tool, if applicable. No additional management support is needed unless otherwise documented below in the visit note.  Longstanding R shoulder pain.  Pain with ext rotation, limited ROM.  Already in PT for her shoulder, per Dr. Donne Hazel.  Pain reaching up.  No specific initial injury.  No L shoulder sx.   Taking tramadol for pain, with a little relief.    Recently widowed, discussed.  She is getting by.  I offered my condolences and she thanked me.   Meds, vitals, and allergies reviewed.   ROS: See HPI.  Otherwise, noncontributory.  nad Tearful discussing her husband's death, but regains composure.  R shoulder with pain with ROM above lateral. No arm drop.  Pain with ext>int rotation.  Normal grip and sensation.  R scap assist noted, with relief of pain on ext rotation.

## 2014-04-17 ENCOUNTER — Ambulatory Visit: Payer: Medicare Other

## 2014-04-17 DIAGNOSIS — M25611 Stiffness of right shoulder, not elsewhere classified: Secondary | ICD-10-CM

## 2014-04-17 DIAGNOSIS — R52 Pain, unspecified: Secondary | ICD-10-CM

## 2014-04-17 DIAGNOSIS — R29898 Other symptoms and signs involving the musculoskeletal system: Secondary | ICD-10-CM | POA: Diagnosis not present

## 2014-04-17 DIAGNOSIS — M25519 Pain in unspecified shoulder: Secondary | ICD-10-CM | POA: Insufficient documentation

## 2014-04-17 NOTE — Assessment & Plan Note (Signed)
Refer to ortho, d/w pt about anatomy.  She'll work on strengthening exercises to move her scapula posteriorly.  That may help some.  Continue with arm swings to prev frozen shoulder.  D/w pt.  She agrees. See plain films from today.

## 2014-04-17 NOTE — Therapy (Signed)
[place], [name], [address] Phone: [phone]   Fax:  [phone]  Physical Therapy Treatment  Patient Details  Name: [name] MRN: [id number] Date of Birth: 1940/03/22 Referring Provider:  Tonia Ghent, MD  Encounter Date: 04/17/2014      PT End of Session - 04/17/14 1516    Visit Number 4   Number of Visits 9   Date for PT Re-Evaluation 05/09/14   PT Start Time 2035   PT Stop Time 5974   PT Time Calculation (min) 46 min      Past Medical History  Diagnosis Date  . Esophageal Stricture   . Monoclonal gammopathy   . Anemia     Anemia-NOS / PMH, Dr Julien Nordmann  . Hyperlipidemia   . Vertical diplopia   . MVP (mitral valve prolapse)   . Cancer     stage I right breast  . Skin cancer     basal cell L neck  . Allergy     SEASONAL  . Arthritis   . Cataract     BILATERAL  . Osteoporosis     Past Surgical History  Procedure Laterality Date  . Total abdominal hysterectomy w/ bilateral salpingoophorectomy  74 yrs old    for pain,prolapse ; G3 P2  . Salpingoophorectomy  2002    for benign growths  . Hemorrhoid surgery  1970's  . Appendectomy  2002  . Cardiac catheterization  2005    neg  . Colonoscopy      negative   . Bone biopsy  11/06/10  . Breast lumpectomy  02/2010    right breast lumpectomy and sentinel node biopsy  . Upper gastrointestinal endoscopy      There were no vitals filed for this visit.  Visit Diagnosis:  Stiffness of joint, shoulder region, right  Pain aggravated by changing postions  Weakness of right arm      Subjective Assessment - 04/17/14 1430    Symptoms My arm felt good after being stretched last time, not quite as sore today as last time. Saw my primary yesterday, Dr. [name], and he is sending me to an orthopedist ([name]). He thinks something might be wrong with my rotator cuff.    Currently in Pain? Yes   Pain Location Shoulder   Pain Descriptors / Indicators Aching            [facility] PT Assessment - 04/17/14 0001    AROM   Right Shoulder Flexion 126 Degrees  No pain with this today   Right Shoulder ABduction 74 Degrees  Pain                   [facility] Adult PT Treatment/Exercise - 04/17/14 0001    Shoulder Exercises: Standing   External Rotation AROM;Strengthening;Right   Theraband Level (Shoulder External Rotation) Level 2 (Red)   External Rotation Limitations 1 rep, had pain so stopped   Internal Rotation AROM;Strengthening;Right;10 reps   Theraband Level (Shoulder Internal Rotation) Level 2 (Red)   Flexion AROM;Strengthening;Right;10 reps   Theraband Level (Shoulder Flexion) Level 2 (Red)   Extension AROM;Strengthening;Right;10 reps   Theraband Level (Shoulder Extension) Level 2 (Red)   Retraction AROM;Strengthening;Both;10 reps   Theraband Level (Shoulder Retraction) Level 2 (Red)  2 sets   Shoulder Exercises: Pulleys   Flexion 3 minutes   ABduction 2 minutes   ABduction Limitations Pain was improved with this today.    Shoulder Exercises: Therapy Diona Foley  Flexion 10 reps   ABduction 10 reps                PT Education - 04/17/14 1451    Education provided Yes   Education Details Postural and Rt shoulder strength with red theraband   Person(s) Educated Patient   Methods Explanation;Demonstration;Handout   Comprehension Verbalized understanding;Returned demonstration                [facility] Goals - 04/17/14 1518    CC Long Term Goal  #1   Title patient will be independent in home exercise program for right shoulder ROM and UE strengthening   Status On-going   CC Long Term Goal  #2   Title Right shoulder active flexion to at least 135 degrees for improved overhead reach.  116 degrees attained 04/17/14   Status On-going   CC Long Term Goal  #3   Title Right shoulder active abduction to at least 140 degrees for improved ADLs.  Small improvement of 74  degrees 04/17/14   Status On-going   CC Long Term Goal  #4   Title Pt. will report decrease in right shoulder pain with movement of at least 60%.   Status On-going            Plan - 04/17/14 1456    Clinical Impression Statement Pt did well with new exercises for HEP and verbalized understanding not to push into pain if she has any. AROM measurements are improved, flexion>abduction and today pt has been having less pain with PROM.   Pt will benefit from skilled therapeutic intervention in order to improve on the following deficits Pain;Decreased range of motion;Impaired UE functional use;Decreased strength   Rehab Potential Excellent   PT Frequency 2x / week   PT Duration 4 weeks   PT Treatment/Interventions Manual techniques;Passive range of motion;Therapeutic exercise;Patient/family education   PT Next Visit Plan Continue with ROM exercises and PROM.          Problem List Patient Active Problem List   Diagnosis Date Noted  . Shoulder pain 04/17/2014  . PD (Parkinson's disease) 06/05/2013  . Tremor 05/18/2013  . Irritation of ear 05/18/2013  . Medicare annual wellness visit, initial 05/02/2012  . Neck pain 05/02/2012  . Osteoporosis 03/08/2011  . Neck mass 03/08/2011  . Breast cancer 11/19/2010  . DYSPEPSIA&OTHER SPEC DISORDERS FUNCTION STOMACH 08/01/2008  . DYSPHAGIA UNSPECIFIED 08/01/2008  . ORTHOSTATIC HYPOTENSION 06/28/2008  . GOITER, UNSPECIFIED 07/10/2007  . HYPERLIPIDEMIA 07/10/2007  . MONOCLONAL GAMMOPATHY 07/10/2007  . ANEMIA-NOS 07/10/2007  . FATIGUE 07/10/2007    Otelia Limes, PTA 04/17/2014, 3:19 PM  [place], [name], [address] Phone: [phone]   Fax:  [phone]

## 2014-04-17 NOTE — Patient Instructions (Signed)
Scapular Retraction: Bilateral   Facing anchor, pull arms back, bringing shoulder blades together. Repeat _10___ times per set. Do _2-3___ sets per session. Do __2__ sessions per day.   Copyright  VHI. All rights reserved.  Strengthening: Resisted Internal Rotation   Hold tubing in right hand, elbow at side and forearm out. Rotate forearm in across body. Repeat __10__ times per set. Do __1__ sets per session. Do __2-3__ sessions per day    Strengthening: Resisted Flexion   Hold tubing with right arm at side. Pull forward and up. Move shoulder through pain-free range of motion with elbow straight. Repeat __10__ times per set. Do __1__ sets per session. Do __2-3__ sessions per day.  http://orth.exer.us/824   Copyright  VHI. All rights reserved.  Strengthening: Resisted Extension   Hold tubing in right hand, arm forward. Pull arm back, elbow straight. Repeat __10__ times per set. Do _1___ sets per session. Do __2-3__ sessions per day.  http://orth.exer.us/832   Copyright  VHI. All rights reserved.   Also do wall push ups trying to keep elbows close to sides. 10 reps, 2 sets, 2-3 times a day.

## 2014-04-22 ENCOUNTER — Ambulatory Visit: Payer: Medicare Other | Admitting: Physical Therapy

## 2014-04-22 ENCOUNTER — Encounter: Payer: Self-pay | Admitting: Physical Therapy

## 2014-04-22 DIAGNOSIS — M25611 Stiffness of right shoulder, not elsewhere classified: Secondary | ICD-10-CM | POA: Diagnosis not present

## 2014-04-22 DIAGNOSIS — R29898 Other symptoms and signs involving the musculoskeletal system: Secondary | ICD-10-CM | POA: Diagnosis not present

## 2014-04-22 DIAGNOSIS — M25511 Pain in right shoulder: Secondary | ICD-10-CM

## 2014-04-22 DIAGNOSIS — R52 Pain, unspecified: Secondary | ICD-10-CM | POA: Diagnosis not present

## 2014-04-22 NOTE — Therapy (Signed)
[place], [name], [address] Phone: [phone]   Fax:  [phone]  Physical Therapy Treatment  Patient Details  Name: [name] MRN: [id number] Date of Birth: 07/08/1940 Referring Provider:  Tonia Ghent, MD  Encounter Date: 04/22/2014      PT End of Session - 04/22/14 1400    Visit Number 5   Number of Visits 9   Date for PT Re-Evaluation 05/09/14   PT Start Time 1346   PT Stop Time 1430   PT Time Calculation (min) 44 min   Activity Tolerance Patient tolerated treatment well   Behavior During Therapy [facility] for tasks assessed/performed      Past Medical History  Diagnosis Date  . Esophageal Stricture   . Monoclonal gammopathy   . Anemia     Anemia-NOS / PMH, Dr Julien Nordmann  . Hyperlipidemia   . Vertical diplopia   . MVP (mitral valve prolapse)   . Cancer     stage I right breast  . Skin cancer     basal cell L neck  . Allergy     SEASONAL  . Arthritis   . Cataract     BILATERAL  . Osteoporosis     Past Surgical History  Procedure Laterality Date  . Total abdominal hysterectomy w/ bilateral salpingoophorectomy  74 yrs old    for pain,prolapse ; G3 P2  . Salpingoophorectomy  2002    for benign growths  . Hemorrhoid surgery  1970's  . Appendectomy  2002  . Cardiac catheterization  2005    neg  . Colonoscopy      negative   . Bone biopsy  11/06/10  . Breast lumpectomy  02/2010    right breast lumpectomy and sentinel node biopsy  . Upper gastrointestinal endoscopy      There were no vitals filed for this visit.  Visit Diagnosis:  Stiffness of joint, shoulder region, right  Weakness of right arm  Right shoulder pain      Subjective Assessment - 04/22/14 1350    Symptoms Some discomfort in my right (anterior) shoulder but otherwise doing ok   Currently in Pain? Yes   Pain Score 2    Pain Location Shoulder   Pain Orientation Right   Pain Descriptors /  Indicators Aching   Pain Type Chronic pain   Pain Onset More than a month ago                       [facility] Adult PT Treatment/Exercise - 04/22/14 0001    Shoulder Exercises: Pulleys   Flexion 3 minutes   ABduction 3 minutes   ABduction Limitations No c/o pain today   Shoulder Exercises: Therapy Ball   Flexion 10 reps   ABduction 10 reps   Shoulder Exercises: ROM/Strengthening   "W" Arms Standing against wall 3x10 second holds to tolerance   Other ROM/Strengthening Exercises Finger ladder abduction x5 with verbal cues to face forward and avoid compensation   Manual Therapy   Manual Therapy Other (comment)   Myofascial Release In supine, upper medial arm.   Passive ROM In Supine to Rt shoulder into flexion, abduction, and external rotation all to pts tolerance.   Other Manual Therapy Soft tissue work with Biotone to right anterior shoulder in supine to reduce pain                        Long Term  Clinic Goals - 04/17/14 1518    CC Long Term Goal  #1   Title patient will be independent in home exercise program for right shoulder ROM and UE strengthening   Status On-going   CC Long Term Goal  #2   Title Right shoulder active flexion to at least 135 degrees for improved overhead reach.  116 degrees attained 04/17/14   Status On-going   CC Long Term Goal  #3   Title Right shoulder active abduction to at least 140 degrees for improved ADLs.  Small improvement of 74 degrees 04/17/14   Status On-going   CC Long Term Goal  #4   Title Pt. will report decrease in right shoulder pain with movement of at least 60%.   Status On-going            Plan - 04/22/14 1448    Clinical Impression Statement ROM appears to be much improved.  Her pain occurs at various times during ROM but had no complaints today.  She reports feeling like things are improving and questioned the need to see an orthopedist.   Pt will benefit from skilled therapeutic intervention in  order to improve on the following deficits Pain;Decreased range of motion;Impaired UE functional use;Decreased strength   Rehab Potential Excellent   PT Frequency 2x / week   PT Duration 4 weeks   PT Treatment/Interventions Manual techniques;Passive range of motion;Therapeutic exercise;Patient/family education   PT Next Visit Plan Continue with ROM exercises and PROM.  Measure AROM.   Consulted and Agree with Plan of Care Patient        Problem List Patient Active Problem List   Diagnosis Date Noted  . Shoulder pain 04/17/2014  . PD (Parkinson's disease) 06/05/2013  . Tremor 05/18/2013  . Irritation of ear 05/18/2013  . Medicare annual wellness visit, initial 05/02/2012  . Neck pain 05/02/2012  . Osteoporosis 03/08/2011  . Neck mass 03/08/2011  . Breast cancer 11/19/2010  . DYSPEPSIA&OTHER SPEC DISORDERS FUNCTION STOMACH 08/01/2008  . DYSPHAGIA UNSPECIFIED 08/01/2008  . ORTHOSTATIC HYPOTENSION 06/28/2008  . GOITER, UNSPECIFIED 07/10/2007  . HYPERLIPIDEMIA 07/10/2007  . MONOCLONAL GAMMOPATHY 07/10/2007  . ANEMIA-NOS 07/10/2007  . FATIGUE 07/10/2007    Annia Friendly, PT 04/22/2014, 2:50 PM  [place], [name], [address] Phone: [phone]   Fax:  [phone]

## 2014-04-25 ENCOUNTER — Ambulatory Visit: Payer: Medicare Other | Admitting: Physical Therapy

## 2014-04-25 ENCOUNTER — Ambulatory Visit: Payer: Medicare Other

## 2014-04-25 ENCOUNTER — Telehealth: Payer: Self-pay | Admitting: Neurology

## 2014-04-25 ENCOUNTER — Ambulatory Visit: Payer: Medicare Other | Admitting: Occupational Therapy

## 2014-04-25 DIAGNOSIS — R293 Abnormal posture: Secondary | ICD-10-CM

## 2014-04-25 DIAGNOSIS — R490 Dysphonia: Secondary | ICD-10-CM

## 2014-04-25 DIAGNOSIS — G20 Parkinson's disease: Secondary | ICD-10-CM

## 2014-04-25 DIAGNOSIS — R29898 Other symptoms and signs involving the musculoskeletal system: Secondary | ICD-10-CM

## 2014-04-25 DIAGNOSIS — M25511 Pain in right shoulder: Secondary | ICD-10-CM

## 2014-04-25 DIAGNOSIS — R279 Unspecified lack of coordination: Secondary | ICD-10-CM

## 2014-04-25 DIAGNOSIS — R52 Pain, unspecified: Secondary | ICD-10-CM

## 2014-04-25 DIAGNOSIS — M25611 Stiffness of right shoulder, not elsewhere classified: Secondary | ICD-10-CM | POA: Diagnosis not present

## 2014-04-25 NOTE — Therapy (Signed)
[facility] [address] [place], [name], [address] Phone: [phone]   Fax:  [phone]  Patient Details  Name: [name] MRN: [id number] Date of Birth: May 26, 1940 Referring Provider:  Alonza Bogus, D.O.  Encounter Date: 04/25/2014    Speech Therapy Parkinson's Disease Screen  Date: 04-25-14    Decibel Level today: 68dB  (WNL=70-72 dB) with sound level meter 30cm away from pt's mouth.  Pt would benefit from Speech-language eval for dysarthria to address lower than WNL conversational volume. If ordered, this would be scheduled following her cancer rehab in approx 1-2 weeks.  Pt has not experienced difficulty in swallowing warranting objective evaluation.  [facility] 04/25/2014, 10:51 AM  [facility] [address] [place], [name], [address] Phone: [phone]   Fax:  [phone]

## 2014-04-25 NOTE — Telephone Encounter (Signed)
-----   Message from [place], OT sent at 04/25/2014 12:19 PM EDT ----- Regarding: request OT and ST evaluations Dr. Carles Collet,  Pt was seen for OT, PT, ST screens today.  We recommended OT and ST due to decreased voice volume and coordination deficits/difficulty with ADLs.  Please send referrals via epic if you agree.    Thank you!

## 2014-04-25 NOTE — Therapy (Signed)
[facility] [address] [place], [name], [address] Phone: [phone]   Fax:  [phone]  Patient Details  Name: [name] MRN: [id number] Date of Birth: 1940-02-03 Referring Provider:  Tonia Ghent, MD  Encounter Date: 04/25/2014   Occupational Therapy Parkinson's Disease Screen  Physical Performance Test item #4 (donning/doffing jacket):  10.47sec  9-hole peg test:    RUE  25.88sec        LUE  27.31sec  Box & Blocks Test:   RUE  56 blocks        LUE  48 blocks  Change in ability to perform ADLs/IADLs:  difficulty donning bra with difficulty pulling down in the back.  Other Comments:  Seeing CA rehab at [facility]. due to R shoulder pain with hx of breast CA (lumpectomy and radiation) and will be seeing ortho MD soon.  Pt would benefit from occupational therapy evaluation due to  coordination deficits/slowing noted with LUE, difficulty with ADLs, coordination deficits, and to establish PD-specific HEP.  Pt reports that she would like to pursue occupational therapy evaluation once she finishes with CA rehab.  [facility] 04/25/2014, 10:23 AM  [facility] [address] [place], [name], [address] Phone: [phone]   Fax:  [phone]

## 2014-04-25 NOTE — Therapy (Signed)
[facility] [address] [place], [name], [address] Phone: [phone]   Fax:  [phone]  Patient Details  Name: [name] MRN: [id number] Date of Birth: 1940/09/11 Referring Provider:  Tonia Ghent, MD  Encounter Date: 04/25/2014 Physical Therapy Parkinson's Disease Screen   Timed Up and Go test:9.56 sec (9.88 ar previous PT d/c)  10 meter walk test: 3.49 ft/sec (3.7 ft/sec at previous PT d/c)  5 time sit to stand test:15.40 sec (10.66 sec at d/c)   Patient does not require Physical Therapy services at this time.  Recommend Physical Therapy screen in 6-8 months.  PT is not recommending eval at this time, as she has been out of exercise routine, in cancer rehab, and her husband recently passed away.  Pt is transitioning back into exercise routine at this time.  Occupational therapy is recommending evaluation and treatment, so this PT will informally touch base with patient for any PT-specific needs; otherwise, recommend PT screen again in 6-8 months.    Daneisha Surges W. 04/25/2014, 10:36 AM  Mady Haagensen, PT 04/25/2014 10:52 AM Phone: [phone] Fax: [facility] [place] [address] [place], [name], [address] Phone: [phone]   Fax:  [phone]

## 2014-04-25 NOTE — Therapy (Signed)
[place], [name], [address] Phone: [phone]   Fax:  [phone]  Physical Therapy Treatment  Patient Details  Name: [name] MRN: [id number] Date of Birth: 1941/01/23 Referring Provider:  Tonia Ghent, MD  Encounter Date: 04/25/2014      PT End of Session - 04/25/14 1523    Visit Number 6   Number of Visits 9   Date for PT Re-Evaluation 05/09/14   PT Start Time 3094   PT Stop Time 1524   PT Time Calculation (min) 45 min      Past Medical History  Diagnosis Date  . Esophageal Stricture   . Monoclonal gammopathy   . Anemia     Anemia-NOS / PMH, Dr Julien Nordmann  . Hyperlipidemia   . Vertical diplopia   . MVP (mitral valve prolapse)   . Cancer     stage I right breast  . Skin cancer     basal cell L neck  . Allergy     SEASONAL  . Arthritis   . Cataract     BILATERAL  . Osteoporosis     Past Surgical History  Procedure Laterality Date  . Total abdominal hysterectomy w/ bilateral salpingoophorectomy  74 yrs old    for pain,prolapse ; G3 P2  . Salpingoophorectomy  2002    for benign growths  . Hemorrhoid surgery  1970's  . Appendectomy  2002  . Cardiac catheterization  2005    neg  . Colonoscopy      negative   . Bone biopsy  11/06/10  . Breast lumpectomy  02/2010    right breast lumpectomy and sentinel node biopsy  . Upper gastrointestinal endoscopy      There were no vitals filed for this visit.  Visit Diagnosis:  Hypokinetic Parkinsonian dysphonia  Abnormal posture  Lack of coordination  Stiffness of joint, shoulder region, right  Weakness of right arm  Right shoulder pain  Pain aggravated by changing postions      Subjective Assessment - 04/25/14 1441    Symptoms Doing good today, got my husbands death certificate yesterday so thats something I dont have to worry about anymore. My Rt shoulder only hurts a little today on the top and in my axilla.  Saw Amy Gerrit Friends this morning for my Parkinsons screening since seeing her for PT last summer and she said I've only declined with my Parkinsons a little, partly because I stopped working out for [employer] when my husband got sick.She (Amy) also thinks that my shoulder problem could have been coming from Parkinsons, not an injury.     Currently in Pain? Yes   Pain Score 2    Pain Location Shoulder   Pain Orientation Right   Pain Descriptors / Indicators Aching            [facility] PT Assessment - 04/25/14 0001    AROM   Right Shoulder Flexion 149 Degrees   Right Shoulder ABduction 178 Degrees                   [facility] Adult PT Treatment/Exercise - 04/25/14 0001    Shoulder Exercises: Pulleys   Flexion 3 minutes   ABduction 3 minutes   Shoulder Exercises: Therapy Ball   Flexion 10 reps   ABduction 10 reps   Shoulder Exercises: Stretch   Corner Stretch 4 reps;20 seconds   Manual Therapy   Myofascial Release In supine, upper medial arm.   Passive  ROM In Supine to Rt shoulder into flexion, abduction, and external rotation all to pts tolerance.   Other Manual Therapy Soft tissue work with Biotone to right anterior shoulder in supine to reduce pain                        Long Term Clinic Goals - 04/25/14 1529    CC Long Term Goal  #1   Title patient will be independent in home exercise program for right shoulder ROM and UE strengthening   Status On-going   CC Long Term Goal  #2   Title Right shoulder active flexion to at least 135 degrees for improved overhead reach.  149 degrees attained 04/25/14   Status Achieved   CC Long Term Goal  #3   Title Right shoulder active abduction to at least 140 degrees for improved ADLs.  178 degrees attained 04/25/14   Status Achieved   CC Long Term Goal  #4   Title Pt. will report decrease in right shoulder pain with movement of at least 60%.  60-75% reported improvement   Status Achieved            Plan - 04/25/14  1525    Clinical Impression Statement Pts AROMis much improved having met both ROM goals today and her pain goal. Now just need to work on finalizing pts HEP as pain is less of an issue with ROM. Pt was in a much more positive mood today as well. Got her husbands death certificate yesterday and might be resuming PT for Parkinsons at [facility].   Pt will benefit from skilled therapeutic intervention in order to improve on the following deficits Pain;Decreased range of motion;Impaired UE functional use;Decreased strength   Rehab Potential Excellent   PT Frequency 2x / week   PT Treatment/Interventions Manual techniques;Passive range of motion;Therapeutic exercise;Patient/family education   PT Next Visit Plan Continue with ROM exercises and PROM.  Progress HEP and possibly work towards discharge in next few visits as pt is making great progress with goals and will seemingly be resuming her Parkinson PT including OT and [facility]  as well this time.   Consulted and Agree with Plan of Care Patient        Problem List Patient Active Problem List   Diagnosis Date Noted  . Shoulder pain 04/17/2014  . PD (Parkinson's disease) 06/05/2013  . Tremor 05/18/2013  . Irritation of ear 05/18/2013  . Medicare annual wellness visit, initial 05/02/2012  . Neck pain 05/02/2012  . Osteoporosis 03/08/2011  . Neck mass 03/08/2011  . Breast cancer 11/19/2010  . DYSPEPSIA&OTHER SPEC DISORDERS FUNCTION STOMACH 08/01/2008  . DYSPHAGIA UNSPECIFIED 08/01/2008  . ORTHOSTATIC HYPOTENSION 06/28/2008  . GOITER, UNSPECIFIED 07/10/2007  . HYPERLIPIDEMIA 07/10/2007  . MONOCLONAL GAMMOPATHY 07/10/2007  . ANEMIA-NOS 07/10/2007  . FATIGUE 07/10/2007    Otelia Limes, PTA 04/25/2014, 3:32 PM  [place], [name], [address] Phone: [phone]   Fax:  [phone]

## 2014-04-25 NOTE — Telephone Encounter (Signed)
Order sent.

## 2014-04-29 ENCOUNTER — Encounter: Payer: Self-pay | Admitting: Physical Therapy

## 2014-04-29 ENCOUNTER — Ambulatory Visit: Payer: Medicare Other | Attending: General Surgery | Admitting: Physical Therapy

## 2014-04-29 DIAGNOSIS — R29898 Other symptoms and signs involving the musculoskeletal system: Secondary | ICD-10-CM | POA: Diagnosis not present

## 2014-04-29 DIAGNOSIS — R293 Abnormal posture: Secondary | ICD-10-CM

## 2014-04-29 DIAGNOSIS — M25511 Pain in right shoulder: Secondary | ICD-10-CM

## 2014-04-29 DIAGNOSIS — R52 Pain, unspecified: Secondary | ICD-10-CM | POA: Insufficient documentation

## 2014-04-29 DIAGNOSIS — M25611 Stiffness of right shoulder, not elsewhere classified: Secondary | ICD-10-CM

## 2014-04-29 NOTE — Therapy (Signed)
[place], [name], [address] Phone: [phone]   Fax:  [phone]  Physical Therapy Treatment  Patient Details  Name: [name] MRN: [id number] Date of Birth: [birth year] Referring Provider:  Tonia Ghent, MD  Encounter Date: 04/29/2014      PT End of Session - 04/29/14 1319    Visit Number 7   Number of Visits 9   Date for PT Re-Evaluation 05/09/14   PT Start Time 1300   PT Stop Time 1345   PT Time Calculation (min) 45 min   Activity Tolerance Patient tolerated treatment well   Behavior During Therapy [facility] for tasks assessed/performed      Past Medical History  Diagnosis Date  . Esophageal Stricture   . Monoclonal gammopathy   . Anemia     Anemia-NOS / PMH, Dr Julien Nordmann  . Hyperlipidemia   . Vertical diplopia   . MVP (mitral valve prolapse)   . Cancer     stage I right breast  . Skin cancer     basal cell L neck  . Allergy     SEASONAL  . Arthritis   . Cataract     BILATERAL  . Osteoporosis     Past Surgical History  Procedure Laterality Date  . Total abdominal hysterectomy w/ bilateral salpingoophorectomy  74 yrs old    for pain,prolapse ; G3 P2  . Salpingoophorectomy  2002    for benign growths  . Hemorrhoid surgery  1970's  . Appendectomy  2002  . Cardiac catheterization  2005    neg  . Colonoscopy      negative   . Bone biopsy  11/06/10  . Breast lumpectomy  02/2010    right breast lumpectomy and sentinel node biopsy  . Upper gastrointestinal endoscopy      There were no vitals filed for this visit.  Visit Diagnosis:  Abnormal posture  Stiffness of joint, shoulder region, right  Weakness of right arm  Right shoulder pain  Pain aggravated by changing postions      Subjective Assessment - 04/29/14 1304    Subjective Feeling pretty good today.  I had pain in my arm when I woke up this morning but no pain right now.   Currently in Pain? No/denies           [facility] Adult PT Treatment/Exercise - 04/29/14 0001    Shoulder Exercises: Pulleys   Flexion 3 minutes   ABduction 3 minutes   Shoulder Exercises: Therapy Ball   Flexion 10 reps   ABduction 10 reps   Shoulder Exercises: ROM/Strengthening   "W" Arms Standing against wall 3x10 second holds to tolerance   Other ROM/Strengthening Exercises Finger ladder abduction x5 with verbal cues to face forward and avoid compensation   Shoulder Exercises: Stretch   Corner Stretch 4 reps;20 seconds   Manual Therapy   Passive ROM In Supine to Rt shoulder into flexion, abduction, and external rotation all to pts tolerance.   Other Manual Therapy Soft tissue work with Biotone to right anterior shoulder in supine to reduce pain           Long Term Clinic Goals - 04/25/14 1529    CC Long Term Goal  #1   Title patient will be independent in home exercise program for right shoulder ROM and UE strengthening   Status On-going   CC Long Term Goal  #2   Title Right shoulder active flexion to at least 135  degrees for improved overhead reach.  149 degrees attained 04/25/14   Status Achieved   CC Long Term Goal  #3   Title Right shoulder active abduction to at least 140 degrees for improved ADLs.  178 degrees attained 04/25/14   Status Achieved   CC Long Term Goal  #4   Title Pt. will report decrease in right shoulder pain with movement of at least 60%.  60-75% reported improvement   Status Achieved            Plan - 04/29/14 1320    Clinical Impression Statement Patinent continues to make progress with her ROM and pain reduction.  She sees [name] 04/30/14 for a shoulder assessment.  Will continue depending of his findings.   Pt will benefit from skilled therapeutic intervention in order to improve on the following deficits Pain;Decreased range of motion;Impaired UE functional use;Decreased strength   Rehab Potential Excellent   PT Frequency 2x / week   PT Duration 4 weeks   PT  Treatment/Interventions Manual techniques;Passive range of motion;Therapeutic exercise;Patient/family education   PT Next Visit Plan Continue ROM and begin gentle strengthening for right shoulder   Consulted and Agree with Plan of Care Patient        Problem List Patient Active Problem List   Diagnosis Date Noted  . Shoulder pain 04/17/2014  . PD (Parkinson's disease) 06/05/2013  . Tremor 05/18/2013  . Irritation of ear 05/18/2013  . Medicare annual wellness visit, initial 05/02/2012  . Neck pain 05/02/2012  . Osteoporosis 03/08/2011  . Neck mass 03/08/2011  . Breast cancer 11/19/2010  . DYSPEPSIA&OTHER SPEC DISORDERS FUNCTION STOMACH 08/01/2008  . DYSPHAGIA UNSPECIFIED 08/01/2008  . ORTHOSTATIC HYPOTENSION 06/28/2008  . GOITER, UNSPECIFIED 07/10/2007  . HYPERLIPIDEMIA 07/10/2007  . MONOCLONAL GAMMOPATHY 07/10/2007  . ANEMIA-NOS 07/10/2007  . FATIGUE 07/10/2007    Annia Friendly, PT 04/29/2014, 4:39 PM  [place], [name], [address] Phone: [phone]   Fax:  [phone]

## 2014-04-30 DIAGNOSIS — M7541 Impingement syndrome of right shoulder: Secondary | ICD-10-CM | POA: Diagnosis not present

## 2014-05-02 ENCOUNTER — Ambulatory Visit: Payer: Medicare Other | Admitting: Physical Therapy

## 2014-05-02 ENCOUNTER — Encounter: Payer: Self-pay | Admitting: Physical Therapy

## 2014-05-02 DIAGNOSIS — R29898 Other symptoms and signs involving the musculoskeletal system: Secondary | ICD-10-CM | POA: Diagnosis not present

## 2014-05-02 DIAGNOSIS — M25611 Stiffness of right shoulder, not elsewhere classified: Secondary | ICD-10-CM

## 2014-05-02 DIAGNOSIS — R52 Pain, unspecified: Secondary | ICD-10-CM | POA: Diagnosis not present

## 2014-05-02 DIAGNOSIS — M25511 Pain in right shoulder: Secondary | ICD-10-CM

## 2014-05-02 NOTE — Therapy (Signed)
[place], [name], [address] Phone: [phone]   Fax:  [phone]  Physical Therapy Treatment  Patient Details  Name: [name] MRN: [id number] Date of Birth: 21-Dec-1940 Referring Provider:  Dr. Rolm Bookbinder  Encounter Date: 05/02/2014      PT End of Session - 05/02/14 1151    Visit Number 8   Number of Visits 20   Date for PT Re-Evaluation 05/31/14   PT Start Time 1102   PT Stop Time 1150   PT Time Calculation (min) 48 min   Activity Tolerance Patient tolerated treatment well   Behavior During Therapy [facility] for tasks assessed/performed      Past Medical History  Diagnosis Date  . Esophageal Stricture   . Monoclonal gammopathy   . Anemia     Anemia-NOS / PMH, Dr Julien Nordmann  . Hyperlipidemia   . Vertical diplopia   . MVP (mitral valve prolapse)   . Cancer     stage I right breast  . Skin cancer     basal cell L neck  . Allergy     SEASONAL  . Arthritis   . Cataract     BILATERAL  . Osteoporosis     Past Surgical History  Procedure Laterality Date  . Total abdominal hysterectomy w/ bilateral salpingoophorectomy  74 yrs old    for pain,prolapse ; G3 P2  . Salpingoophorectomy  2002    for benign growths  . Hemorrhoid surgery  1970's  . Appendectomy  2002  . Cardiac catheterization  2005    neg  . Colonoscopy      negative   . Bone biopsy  11/06/10  . Breast lumpectomy  02/2010    right breast lumpectomy and sentinel node biopsy  . Upper gastrointestinal endoscopy      There were no vitals filed for this visit.  Visit Diagnosis:  Stiffness of joint, shoulder region, right - Plan: PT plan of care cert/re-cert  Weakness of right arm - Plan: PT plan of care cert/re-cert  Right shoulder pain - Plan: PT plan of care cert/re-cert      Subjective Assessment - 05/02/14 1104    Subjective Saw an orthopedic PA yesterday Olivia Mackie Shuford) at Dr. Susie Cassette office and she  gave me a cortisone shot on top of my (right) shoulder.  It helped my pain that day and today my pain is 2/10.   Pertinent History PA ordered strengthening exercises for right shoulder impingement.   Currently in Pain? Yes   Pain Score 2    Pain Location Shoulder   Pain Orientation Right   Pain Descriptors / Indicators Sharp   Pain Type Chronic pain   Pain Onset More than a month ago   Pain Frequency Intermittent   Aggravating Factors  reaching overhead   Pain Relieving Factors rest                  Quick Dash - 05/02/14 0001    Open a tight or new jar Moderate difficulty   Do heavy household chores (wash walls, wash floors) Mild difficulty   Carry a shopping bag or briefcase Mild difficulty   Wash your back Mild difficulty   Use a knife to cut food No difficulty   Recreational activities in which you take some force or impact through your arm, shoulder, or hand (golf, hammering, tennis) Mild difficulty   During the past week, to what extent has your arm, shoulder or hand  problem interfered with your normal social activities with family, friends, neighbors, or groups? Modererately   During the past week, to what extent has your arm, shoulder or hand problem limited your work or other regular daily activities Modererately   Arm, shoulder, or hand pain. None   Tingling (pins and needles) in your arm, shoulder, or hand Mild   DASH Score 22.73 %             [facility] Adult PT Treatment/Exercise - 05/02/14 0001    Shoulder Exercises: Sidelying   External Rotation Weight (lbs) 1# 2x10 with tactile cues for proper technique   ABduction Weight (lbs) 1# 2x10 monitoring pain   ABduction Limitations     Shoulder Exercises: Standing   Theraband Level (Shoulder External Rotation) Level 1 (Yellow)  Rockwood with verbal cues for technique x10   Theraband Level (Shoulder Internal Rotation) Level 1 (Yellow)  [place]wood with verbal cues for technique x10   Theraband Level (Shoulder  Flexion) Level 1 (Yellow)  [place] with verbal cues for technique x10   Shoulder Flexion Weight (lbs) 1# to 90 degrees x10 with rt UE   Shoulder ABduction Weight (lbs) 1# to 90 degrees x10 right UE   Theraband Level (Shoulder Extension) Level 1 (Yellow)  [place] with verbal cues for technique x10   Shoulder Exercises: Pulleys   Flexion 3 minutes   ABduction 3 minutes   Manual Therapy   Passive ROM In Supine to Rt shoulder into flexion, abduction, and external rotation all to pts tolerance.   Other Manual Therapy Soft tissue work with Biotone to right anterior shoulder in supine to reduce pain                PT Education - 05/02/14 1150    Education Details Rockwood exercises with yellow theraband   Person(s) Educated Patient   Methods Explanation;Demonstration;Handout   Comprehension Verbalized understanding;Returned demonstration                [facility] Goals - 05/02/14 1156    [place] Term Goal  #1   Title patient will be independent in home exercise program for right shoulder ROM and UE strengthening   Time 4   Period Weeks   Status Achieved   CC Long Term Goal  #2   Title Right shoulder active flexion to at least 135 degrees for improved overhead reach.   Time 4   Period Weeks   Status On-going   CC Long Term Goal  #3   Title Right shoulder active abduction to at least 140 degrees for improved ADLs.   Time 4   Period Weeks   Status On-going   CC Long Term Goal  #4   Title Pt. will report decrease in right shoulder pain with movement of at least 60%.   Time 4   Period Weeks   Status On-going            Plan - 05/02/14 1152    Clinical Impression Statement Patient saw an orthopedic PA on 04/30/14 and reports she requested 2x/wk for 4 weeks focused on rotator cuff strengthening in addition to what we are currently doing (order scanned in today).  Pt tolerated rockwood strengthening today as well as weights without c/o increased pain.  She  will benefit from continued PT focused on strengthening and reducing pain.  Renewal completed today and G-code completed.   Pt will benefit from skilled therapeutic intervention in order to improve on the following deficits Decreased  strength;Pain;Impaired UE functional use;Decreased range of motion   Rehab Potential Excellent   Clinical Impairments Affecting Rehab Potential none   PT Frequency 2x / week   PT Duration 4 weeks   PT Treatment/Interventions Manual techniques;Therapeutic exercise;Other (comment);Patient/family education;Electrical Stimulation  Iontophoresis with 4mg /mL dexamethasone   PT Next Visit Plan Continue ROM and begin gentle strengthening for right shoulder and begin ionto if MD signs recert   Consulted and Agree with Plan of Care Patient          G-Codes - May 28, 2014 20-May-1200    Functional Assessment Tool Used quick dash   Functional Limitation Carrying, moving and handling objects   Carrying, Moving and Handling Objects Current Status [phone]) At least 20 percent but less than 40 percent impaired, limited or restricted   Carrying, Moving and Handling Objects Goal Status ([id number]) At least 1 percent but less than 20 percent impaired, limited or restricted      Problem List Patient Active Problem List   Diagnosis Date Noted  . Shoulder pain 04/17/2014  . PD (Parkinson's disease) 06/05/2013  . Tremor 05/18/2013  . Irritation of ear 05/18/2013  . Medicare annual wellness visit, initial 05/02/2012  . Neck pain 05/02/2012  . Osteoporosis 03/08/2011  . Neck mass 03/08/2011  . Breast cancer 11/19/2010  . DYSPEPSIA&OTHER SPEC DISORDERS FUNCTION STOMACH 08/01/2008  . DYSPHAGIA UNSPECIFIED 08/01/2008  . ORTHOSTATIC HYPOTENSION 06/28/2008  . GOITER, UNSPECIFIED 07/10/2007  . HYPERLIPIDEMIA 07/10/2007  . MONOCLONAL GAMMOPATHY 07/10/2007  . ANEMIA-NOS 07/10/2007  . FATIGUE 07/10/2007    Annia Friendly, PT 2014/05/28, 12:11 PM  [place], [name], [address] Phone: [phone]   Fax:  [phone]

## 2014-05-02 NOTE — Patient Instructions (Signed)
   Strengthening: Resisted Internal Rotation   Hold tubing in left hand, elbow at side and forearm out. Rotate forearm in across body. Repeat __10__ times per set. Do __1__ sets per session. Do __1__ sessions per day.  http://orth.exer.us/830   Copyright  VHI. All rights reserved.  Strengthening: Resisted External Rotation   Hold tubing in right hand, elbow at side and forearm across body. Rotate forearm out. Repeat _10___ times per set. Do __1__ sets per session. Do __1__ sessions per day.  http://orth.exer.us/828   Copyright  VHI. All rights reserved.  Strengthening: Resisted Flexion   Hold tubing with left arm at side. Pull forward and up. Move shoulder through pain-free range of motion. Repeat _10___ times per set. Do __1__ sets per session. Do _1___ sessions per day.  http://orth.exer.us/824   Copyright  VHI. All rights reserved.  Strengthening: Resisted Extension   Hold tubing in right hand, arm forward. Pull arm back, elbow straight. Repeat _10___ times per set. Do __1__ sets per session. Do __1__ sessions per day.  http://orth.exer.us/832   Copyright  VHI. All rights reserved.

## 2014-05-07 ENCOUNTER — Ambulatory Visit: Payer: Medicare Other | Admitting: Physical Therapy

## 2014-05-07 DIAGNOSIS — M25611 Stiffness of right shoulder, not elsewhere classified: Secondary | ICD-10-CM

## 2014-05-07 DIAGNOSIS — R29898 Other symptoms and signs involving the musculoskeletal system: Secondary | ICD-10-CM | POA: Diagnosis not present

## 2014-05-07 DIAGNOSIS — M25511 Pain in right shoulder: Secondary | ICD-10-CM

## 2014-05-07 DIAGNOSIS — R52 Pain, unspecified: Secondary | ICD-10-CM | POA: Diagnosis not present

## 2014-05-07 NOTE — Therapy (Signed)
[place], [name], [address] Phone: [phone]   Fax:  [phone]  Physical Therapy Treatment  Patient Details  Name: [name] MRN: [id number] Date of Birth: September 06, 1940 Referring Provider:  Tonia Ghent, MD  Encounter Date: 05/07/2014      PT End of Session - 05/07/14 1655    Visit Number 9   Number of Visits 20   Date for PT Re-Evaluation 05/31/14   PT Start Time 1510   PT Stop Time 1558   PT Time Calculation (min) 48 min   Activity Tolerance Patient limited by pain   Behavior During Therapy [facility] for tasks assessed/performed      Past Medical History  Diagnosis Date  . Esophageal Stricture   . Monoclonal gammopathy   . Anemia     Anemia-NOS / PMH, Dr Julien Nordmann  . Hyperlipidemia   . Vertical diplopia   . MVP (mitral valve prolapse)   . Cancer     stage I right breast  . Skin cancer     basal cell L neck  . Allergy     SEASONAL  . Arthritis   . Cataract     BILATERAL  . Osteoporosis     Past Surgical History  Procedure Laterality Date  . Total abdominal hysterectomy w/ bilateral salpingoophorectomy  74 yrs old    for pain,prolapse ; G3 P2  . Salpingoophorectomy  2002    for benign growths  . Hemorrhoid surgery  1970's  . Appendectomy  2002  . Cardiac catheterization  2005    neg  . Colonoscopy      negative   . Bone biopsy  11/06/10  . Breast lumpectomy  02/2010    right breast lumpectomy and sentinel node biopsy  . Upper gastrointestinal endoscopy      There were no vitals filed for this visit.  Visit Diagnosis:  Stiffness of joint, shoulder region, right  Weakness of right arm  Right shoulder pain      Subjective Assessment - 05/07/14 1513    Subjective shoulder pain increased, but not as bad as it was   Currently in Pain? Yes   Pain Score 4    Pain Location Shoulder   Pain Orientation Right   Pain Descriptors / Indicators Aching  like a  headache   Aggravating Factors  using it   Pain Relieving Factors resting it                       [facility] Adult PT Treatment/Exercise - 05/07/14 0001    Shoulder Exercises: Sidelying   External Rotation Weight (lbs) 2# 2x10 with tactile cues for proper technique   ABduction Weight (lbs) 2# 2x10 monitoring pain   Shoulder Exercises: Standing   Theraband Level (Shoulder External Rotation) Level 1 (Yellow)  [place] with verbal cues for technique x10   Theraband Level (Shoulder Internal Rotation) Level 1 (Yellow)  Rockwood with verbal cues for technique x10   Theraband Level (Shoulder Flexion) Level 1 (Yellow)  [place]wood with verbal cues for technique x10   Shoulder Flexion Weight (lbs) 2# to 90 degrees x10 with rt UE   Shoulder ABduction Weight (lbs) 2# to 60 degrees x10 right UE  limited ROM so not painful   Theraband Level (Shoulder Extension) Level 1 (Yellow)  [place] with verbal cues for technique x10   Shoulder Exercises: ROM/Strengthening   Prot/Ret//Elev/Dep supine protraction 2# x 10 x 2   Manual  Therapy   Myofascial Release crosshands technique in horizontal, vertical, and diagonal directions   Passive ROM In Supine to Rt shoulder into flexion, abduction, and external rotation all to pts tolerance.   Other Manual Therapy supine with arms outstretched, therapist supporting right arm above neutral horizontal abduction to provide stretch without pain for right chest. Soft tissue work to right shoulder for relief of pain.                         [facility] Goals - 05/02/14 1156    CC Long Term Goal  #1   Title patient will be independent in home exercise program for right shoulder ROM and UE strengthening   Time 4   Period Weeks   Status Achieved   CC Long Term Goal  #2   Title Right shoulder active flexion to at least 135 degrees for improved overhead reach.   Time 4   Period Weeks   Status On-going   CC Long Term Goal  #3   Title  Right shoulder active abduction to at least 140 degrees for improved ADLs.   Time 4   Period Weeks   Status On-going   CC Long Term Goal  #4   Title Pt. will report decrease in right shoulder pain with movement of at least 60%.   Time 4   Period Weeks   Status On-going            Plan - 05/07/14 1656    Clinical Impression Statement Continued strengthening today with slight increases; cued patient to avoid exercise that aggravates pain.     Pt will benefit from skilled therapeutic intervention in order to improve on the following deficits Decreased strength;Pain;Impaired UE functional use;Decreased range of motion   PT Treatment/Interventions Manual techniques;Therapeutic exercise   PT Next Visit Plan Check goals.  Continue ROM and progress gentle strengthening for right shoulder and begin ionto if MD signs recert   Consulted and Agree with Plan of Care Patient        Problem List Patient Active Problem List   Diagnosis Date Noted  . Shoulder pain 04/17/2014  . PD (Parkinson's disease) 06/05/2013  . Tremor 05/18/2013  . Irritation of ear 05/18/2013  . Medicare annual wellness visit, initial 05/02/2012  . Neck pain 05/02/2012  . Osteoporosis 03/08/2011  . Neck mass 03/08/2011  . Breast cancer 11/19/2010  . DYSPEPSIA&OTHER SPEC DISORDERS FUNCTION STOMACH 08/01/2008  . DYSPHAGIA UNSPECIFIED 08/01/2008  . ORTHOSTATIC HYPOTENSION 06/28/2008  . GOITER, UNSPECIFIED 07/10/2007  . HYPERLIPIDEMIA 07/10/2007  . MONOCLONAL GAMMOPATHY 07/10/2007  . ANEMIA-NOS 07/10/2007  . FATIGUE 07/10/2007    SALISBURY,DONNA 05/07/2014, 5:00 PM  [place], [name], [address] Phone: [phone]   Fax:  [place], PT 05/07/2014 5:00 PM

## 2014-05-09 ENCOUNTER — Ambulatory Visit: Payer: Medicare Other

## 2014-05-09 DIAGNOSIS — R293 Abnormal posture: Secondary | ICD-10-CM

## 2014-05-09 DIAGNOSIS — M25611 Stiffness of right shoulder, not elsewhere classified: Secondary | ICD-10-CM | POA: Diagnosis not present

## 2014-05-09 DIAGNOSIS — R29898 Other symptoms and signs involving the musculoskeletal system: Secondary | ICD-10-CM

## 2014-05-09 DIAGNOSIS — R52 Pain, unspecified: Secondary | ICD-10-CM

## 2014-05-09 DIAGNOSIS — M25511 Pain in right shoulder: Secondary | ICD-10-CM

## 2014-05-09 DIAGNOSIS — R490 Dysphonia: Secondary | ICD-10-CM

## 2014-05-09 DIAGNOSIS — R279 Unspecified lack of coordination: Secondary | ICD-10-CM

## 2014-05-09 DIAGNOSIS — G20 Parkinson's disease: Secondary | ICD-10-CM

## 2014-05-09 NOTE — Therapy (Signed)
[place], [name], [address] Phone: [phone]   Fax:  [phone]  Physical Therapy Treatment  Patient Details  Name: [name] MRN: [id number] Date of Birth: Dec 12, 1940 Referring Provider:  Rolm Bookbinder, MD  Encounter Date: 05/09/2014      PT End of Session - 05/09/14 1016    Visit Number 10   Number of Visits 20   Date for PT Re-Evaluation 05/31/14   PT Start Time 0936   PT Stop Time 1017   PT Time Calculation (min) 41 min      Past Medical History  Diagnosis Date  . Esophageal Stricture   . Monoclonal gammopathy   . Anemia     Anemia-NOS / PMH, Dr Julien Nordmann  . Hyperlipidemia   . Vertical diplopia   . MVP (mitral valve prolapse)   . Cancer     stage I right breast  . Skin cancer     basal cell L neck  . Allergy     SEASONAL  . Arthritis   . Cataract     BILATERAL  . Osteoporosis     Past Surgical History  Procedure Laterality Date  . Total abdominal hysterectomy w/ bilateral salpingoophorectomy  74 yrs old    for pain,prolapse ; G3 P2  . Salpingoophorectomy  2002    for benign growths  . Hemorrhoid surgery  1970's  . Appendectomy  2002  . Cardiac catheterization  2005    neg  . Colonoscopy      negative   . Bone biopsy  11/06/10  . Breast lumpectomy  02/2010    right breast lumpectomy and sentinel node biopsy  . Upper gastrointestinal endoscopy      There were no vitals filed for this visit.  Visit Diagnosis:  Stiffness of joint, shoulder region, right  Weakness of right arm  Right shoulder pain  Abnormal posture  Pain aggravated by changing postions  Hypokinetic Parkinsonian dysphonia  Lack of coordination      Subjective Assessment - 05/09/14 0942    Subjective My Lt shoulder is a 2/10, always just feels a little achy.   Currently in Pain? Yes   Pain Score 2    Pain Location Shoulder   Pain Orientation Right   Pain Descriptors /  Indicators Aching                       [facility] Adult PT Treatment/Exercise - 05/09/14 0001    Shoulder Exercises: Standing   Shoulder Flexion Weight (lbs) 2# to 90 degrees x10 with bil UE's   Shoulder ABduction Weight (lbs) 2# to 60 degrees x10 right UE  still within pain free ROM   Other Standing Exercises Scaption to 60 degrees with 2 lbs 10 reps, and using chair for Rt UE rows with 2 lbs, demo and tactile cues for technique, no pain reported.   Other Standing Exercises Wall push ups 10 reps, returned correct demo; leaning against wall for bil bicep curls 2 lbs, 2 sets of 10 reps   Iontophoresis   Type of Iontophoresis Dexamethasone   Location Lateral shoulder   Dose 1 mL   Time 4-6 hr wear  Pt instructed regarding precautions and removal   Manual Therapy   Myofascial Release crosshands technique in horizontal, vertical, and diagonal directions   Passive ROM In Supine to Rt shoulder into flexion, abduction, and external rotation all to pts tolerance.   Other Manual Therapy Soft  tissue work to shoulder without lotion today due to ionto patch placement this treatment.                         [facility] Goals - 05/02/14 1156    CC Long Term Goal  #1   Title patient will be independent in home exercise program for right shoulder ROM and UE strengthening   Time 4   Period Weeks   Status Achieved   CC Long Term Goal  #2   Title Right shoulder active flexion to at least 135 degrees for improved overhead reach.   Time 4   Period Weeks   Status On-going   CC Long Term Goal  #3   Title Right shoulder active abduction to at least 140 degrees for improved ADLs.   Time 4   Period Weeks   Status On-going   CC Long Term Goal  #4   Title Pt. will report decrease in right shoulder pain with movement of at least 60%.   Time 4   Period Weeks   Status On-going            Plan - 05/09/14 1017    Clinical Impression Statement Pt has made great  improvements overall with ROM, did not measure today but her PROM has improved steadily since beginning of care and she reports less pain and more ROM with ADLs. Continued and progressed strengthening today continuing to work in pain free ROM. First trial of iontophoresis today, pt instructed in and verbalized understanding or precautions and when to doff patch.   Pt will benefit from skilled therapeutic intervention in order to improve on the following deficits Decreased strength;Pain;Impaired UE functional use;Decreased range of motion   Rehab Potential Excellent   Clinical Impairments Affecting Rehab Potential none   PT Frequency 2x / week   PT Duration 4 weeks   PT Treatment/Interventions Manual techniques;Therapeutic exercise   PT Next Visit Plan Check goals.  Continue ROM and progress gentle strengthening for right shoulder and assess ionto tolerance.   Consulted and Agree with Plan of Care Patient        Problem List Patient Active Problem List   Diagnosis Date Noted  . Shoulder pain 04/17/2014  . PD (Parkinson's disease) 06/05/2013  . Tremor 05/18/2013  . Irritation of ear 05/18/2013  . Medicare annual wellness visit, initial 05/02/2012  . Neck pain 05/02/2012  . Osteoporosis 03/08/2011  . Neck mass 03/08/2011  . Breast cancer 11/19/2010  . DYSPEPSIA&OTHER SPEC DISORDERS FUNCTION STOMACH 08/01/2008  . DYSPHAGIA UNSPECIFIED 08/01/2008  . ORTHOSTATIC HYPOTENSION 06/28/2008  . GOITER, UNSPECIFIED 07/10/2007  . HYPERLIPIDEMIA 07/10/2007  . MONOCLONAL GAMMOPATHY 07/10/2007  . ANEMIA-NOS 07/10/2007  . FATIGUE 07/10/2007    Otelia Limes, PTA 05/09/2014, 10:27 AM  [place], [name], [address] Phone: [phone]   Fax:  [phone]

## 2014-05-14 ENCOUNTER — Ambulatory Visit: Payer: Medicare Other | Admitting: Physical Therapy

## 2014-05-14 DIAGNOSIS — M25611 Stiffness of right shoulder, not elsewhere classified: Secondary | ICD-10-CM

## 2014-05-14 DIAGNOSIS — R52 Pain, unspecified: Secondary | ICD-10-CM | POA: Diagnosis not present

## 2014-05-14 DIAGNOSIS — R29898 Other symptoms and signs involving the musculoskeletal system: Secondary | ICD-10-CM | POA: Diagnosis not present

## 2014-05-14 DIAGNOSIS — M25511 Pain in right shoulder: Secondary | ICD-10-CM

## 2014-05-14 NOTE — Therapy (Signed)
[place], [name], [address] Phone: [phone]   Fax:  [phone]  Physical Therapy Treatment  Patient Details  Name: [name] MRN: [id number] Date of Birth: Aug 13, 1940 Referring Provider:  Tonia Ghent, MD  Encounter Date: 05/14/2014      PT End of Session - 05/14/14 1728    Visit Number 11   Number of Visits 20   Date for PT Re-Evaluation 05/31/14   PT Start Time 4709   PT Stop Time 1437   PT Time Calculation (min) 42 min   Behavior During Therapy [facility] for tasks assessed/performed      Past Medical History  Diagnosis Date  . Esophageal Stricture   . Monoclonal gammopathy   . Anemia     Anemia-NOS / PMH, Dr Julien Nordmann  . Hyperlipidemia   . Vertical diplopia   . MVP (mitral valve prolapse)   . Cancer     stage I right breast  . Skin cancer     basal cell L neck  . Allergy     SEASONAL  . Arthritis   . Cataract     BILATERAL  . Osteoporosis     Past Surgical History  Procedure Laterality Date  . Total abdominal hysterectomy w/ bilateral salpingoophorectomy  74 yrs old    for pain,prolapse ; G3 P2  . Salpingoophorectomy  2002    for benign growths  . Hemorrhoid surgery  1970's  . Appendectomy  2002  . Cardiac catheterization  2005    neg  . Colonoscopy      negative   . Bone biopsy  11/06/10  . Breast lumpectomy  02/2010    right breast lumpectomy and sentinel node biopsy  . Upper gastrointestinal endoscopy      There were no vitals filed for this visit.  Visit Diagnosis:  Stiffness of joint, shoulder region, right  Right shoulder pain      Subjective Assessment - 05/14/14 1356    Subjective Did okay with the iontophoresis; felt some tingling, then felt like it was a little better.  No skin problems.   Currently in Pain? Yes   Pain Score 3    Pain Location Shoulder   Pain Orientation Right   Aggravating Factors  using it   Pain Relieving Factors rest;  ionto?            [facility] PT Assessment - 05/14/14 0001    AROM   Right Shoulder Flexion 129 Degrees  prior to stretching   Right Shoulder ABduction 91 Degrees  prior to stretching   PROM   Right Shoulder Flexion 170 Degrees   Right Shoulder ABduction 155 Degrees                     [facility] Adult PT Treatment/Exercise - 05/14/14 0001    Iontophoresis   Type of Iontophoresis Dexamethasone   Location right subacromial space   Dose 1 ml   Time 4-6 hr wear  Pt instructed regarding precautions and removal   Manual Therapy   Myofascial Release crosshands technique in horizontal, vertical, and diagonal directions; right UE myofascial pulling   Passive ROM In Supine to Rt shoulder into flexion, abduction, and external rotation all to pts tolerance.  Pect minor stretches.                        [facility] Goals - 05/14/14 1403    CC  Long Term Goal  #2   Status On-going   CC Long Term Goal  #3   Status On-going   CC Long Term Goal  #4   Baseline At least 20-30% improved as of 05/14/14.   Status On-going            Plan - 05/14/14 1729    Clinical Impression Statement Patient making gradual gains; good right shoulder PROM today, whereas AROM was less than when last measured two weeks ago (though better than on initial eval).   Pt will benefit from skilled therapeutic intervention in order to improve on the following deficits Decreased strength;Pain;Impaired UE functional use;Decreased range of motion   Rehab Potential Good   PT Frequency 2x / week   PT Duration 4 weeks   PT Treatment/Interventions Manual techniques;Therapeutic exercise;Other (comment)  iontophoresis with 4 mg/ml dexamethasone   PT Next Visit Plan Continue manual therapy and therapeutic exercise; continue iontophoresis.   Consulted and Agree with Plan of Care Patient        Problem List Patient Active Problem List   Diagnosis Date Noted  . Shoulder pain 04/17/2014  .  PD (Parkinson's disease) 06/05/2013  . Tremor 05/18/2013  . Irritation of ear 05/18/2013  . Medicare annual wellness visit, initial 05/02/2012  . Neck pain 05/02/2012  . Osteoporosis 03/08/2011  . Neck mass 03/08/2011  . Breast cancer 11/19/2010  . DYSPEPSIA&OTHER SPEC DISORDERS FUNCTION STOMACH 08/01/2008  . DYSPHAGIA UNSPECIFIED 08/01/2008  . ORTHOSTATIC HYPOTENSION 06/28/2008  . GOITER, UNSPECIFIED 07/10/2007  . HYPERLIPIDEMIA 07/10/2007  . MONOCLONAL GAMMOPATHY 07/10/2007  . ANEMIA-NOS 07/10/2007  . FATIGUE 07/10/2007    SALISBURY,DONNA 05/14/2014, 5:32 PM  [place], [name], [address] Phone: [phone]   Fax:  [place], PT 05/14/2014 5:32 PM

## 2014-05-14 NOTE — Patient Instructions (Signed)

## 2014-05-16 ENCOUNTER — Ambulatory Visit: Payer: Medicare Other | Admitting: Physical Therapy

## 2014-05-16 DIAGNOSIS — R29898 Other symptoms and signs involving the musculoskeletal system: Secondary | ICD-10-CM

## 2014-05-16 DIAGNOSIS — M25611 Stiffness of right shoulder, not elsewhere classified: Secondary | ICD-10-CM | POA: Diagnosis not present

## 2014-05-16 DIAGNOSIS — R490 Dysphonia: Secondary | ICD-10-CM

## 2014-05-16 DIAGNOSIS — R52 Pain, unspecified: Secondary | ICD-10-CM | POA: Diagnosis not present

## 2014-05-16 DIAGNOSIS — G20 Parkinson's disease: Secondary | ICD-10-CM

## 2014-05-16 DIAGNOSIS — R293 Abnormal posture: Secondary | ICD-10-CM

## 2014-05-16 DIAGNOSIS — M25511 Pain in right shoulder: Secondary | ICD-10-CM

## 2014-05-16 NOTE — Therapy (Signed)
[place], [name], [address] Phone: [phone]   Fax:  [phone]  Physical Therapy Treatment  Patient Details  Name: [name] MRN: [id number] Date of Birth: 1940-09-22 Referring Provider:  Tonia Ghent, MD  Encounter Date: 05/16/2014      PT End of Session - 05/16/14 1734    Visit Number 12   Number of Visits 20   Date for PT Re-Evaluation 05/31/14   PT Start Time 3491   PT Stop Time 1600   PT Time Calculation (min) 45 min      Past Medical History  Diagnosis Date  . Esophageal Stricture   . Monoclonal gammopathy   . Anemia     Anemia-NOS / PMH, Dr Julien Nordmann  . Hyperlipidemia   . Vertical diplopia   . MVP (mitral valve prolapse)   . Cancer     stage I right breast  . Skin cancer     basal cell L neck  . Allergy     SEASONAL  . Arthritis   . Cataract     BILATERAL  . Osteoporosis     Past Surgical History  Procedure Laterality Date  . Total abdominal hysterectomy w/ bilateral salpingoophorectomy  74 yrs old    for pain,prolapse ; G3 P2  . Salpingoophorectomy  2002    for benign growths  . Hemorrhoid surgery  1970's  . Appendectomy  2002  . Cardiac catheterization  2005    neg  . Colonoscopy      negative   . Bone biopsy  11/06/10  . Breast lumpectomy  02/2010    right breast lumpectomy and sentinel node biopsy  . Upper gastrointestinal endoscopy      There were no vitals filed for this visit.  Visit Diagnosis:  Stiffness of joint, shoulder region, right  Right shoulder pain  Weakness of right arm  Abnormal posture  Hypokinetic Parkinsonian dysphonia      Subjective Assessment - 05/16/14 1522    Subjective "it still hurts"  c/o pain in right shoulder Iontophoreses is helping a little bit.  "Under arm is better"    Currently in Pain? (p) Yes   Pain Score (p) 3    Pain Location (p) Shoulder   Pain Orientation (p) Right   Pain Descriptors /  Indicators (p) Aching                         [facility] Adult PT Treatment/Exercise - 05/16/14 0001    Lumbar Exercises: Supine   Other Supine Lumbar Exercises meeks spinal decompression exericise   Shoulder Exercises: Supine   Other Supine Exercises attempted theraband flexion and abduction, ext rotation, but pt too painful in shoudler   Shoulder Exercises: Sidelying   External Rotation Weight (lbs) 2# isometrics x 10 reps   Other Sidelying Exercises scapular retractin   Shoulder Exercises: Pulleys   Flexion 3 minutes   ABduction 3 minutes   Iontophoresis   Type of Iontophoresis Dexamethasone   Location right subacromial space   Dose 1 ml   Time 4-6 hr wear  Pt instructed regarding precautions and removal   Manual Therapy   Myofascial Release in left sidelying to lateral chest at area of discomfort   Passive ROM In Supine to Rt shoulder into flexion, abduction, and external rotation all to pts tolerance.  Pect minor stretches.  [facility] Goals - 05/14/14 1403    CC Long Term Goal  #2   Status On-going   CC Long Term Goal  #3   Status On-going   CC Long Term Goal  #4   Baseline At least 20-30% improved as of 05/14/14.   Status On-going            Plan - 05/16/14 1734    Clinical Impression Statement focus on postual and scapular strenthening today  as pt continues to have pain with shoudler AROM coninues with ionophoresis for local inflammation relief   PT Next Visit Plan Continue manual therapy and therapeutic exercise; continue iontophoresis.  empahsis scapular control and retraction and try closed chain strengtheniung for arm        Problem List Patient Active Problem List   Diagnosis Date Noted  . Shoulder pain 04/17/2014  . PD (Parkinson's disease) 06/05/2013  . Tremor 05/18/2013  . Irritation of ear 05/18/2013  . Medicare annual wellness visit, initial 05/02/2012  . Neck pain 05/02/2012  .  Osteoporosis 03/08/2011  . Neck mass 03/08/2011  . Breast cancer 11/19/2010  . DYSPEPSIA&OTHER SPEC DISORDERS FUNCTION STOMACH 08/01/2008  . DYSPHAGIA UNSPECIFIED 08/01/2008  . ORTHOSTATIC HYPOTENSION 06/28/2008  . GOITER, UNSPECIFIED 07/10/2007  . HYPERLIPIDEMIA 07/10/2007  . MONOCLONAL GAMMOPATHY 07/10/2007  . ANEMIA-NOS 07/10/2007  . FATIGUE 07/10/2007   Donato Heinz. Owens Shark, PT    05/16/2014, 5:38 PM  [place], [name], [address] Phone: [phone]   Fax:  [phone]

## 2014-05-21 ENCOUNTER — Ambulatory Visit: Payer: Medicare Other | Admitting: Physical Therapy

## 2014-05-21 DIAGNOSIS — M25611 Stiffness of right shoulder, not elsewhere classified: Secondary | ICD-10-CM | POA: Diagnosis not present

## 2014-05-21 DIAGNOSIS — R29898 Other symptoms and signs involving the musculoskeletal system: Secondary | ICD-10-CM | POA: Diagnosis not present

## 2014-05-21 DIAGNOSIS — M25511 Pain in right shoulder: Secondary | ICD-10-CM

## 2014-05-21 DIAGNOSIS — R52 Pain, unspecified: Secondary | ICD-10-CM | POA: Diagnosis not present

## 2014-05-21 NOTE — Therapy (Signed)
[place], [name], [address] Phone: [phone]   Fax:  [phone]  Physical Therapy Treatment  Patient Details  Name: [name] MRN: [id number] Date of Birth: 03-28-1940 Referring Provider:  Tonia Ghent, MD  Encounter Date: 05/21/2014      PT End of Session - 05/21/14 1754    Visit Number 13   Number of Visits 20   Date for PT Re-Evaluation 05/31/14   PT Start Time 1300   PT Stop Time 1344   PT Time Calculation (min) 44 min   Activity Tolerance Patient tolerated treatment well   Behavior During Therapy [facility] for tasks assessed/performed      Past Medical History  Diagnosis Date  . Esophageal Stricture   . Monoclonal gammopathy   . Anemia     Anemia-NOS / PMH, Dr Julien Nordmann  . Hyperlipidemia   . Vertical diplopia   . MVP (mitral valve prolapse)   . Cancer     stage I right breast  . Skin cancer     basal cell L neck  . Allergy     SEASONAL  . Arthritis   . Cataract     BILATERAL  . Osteoporosis     Past Surgical History  Procedure Laterality Date  . Total abdominal hysterectomy w/ bilateral salpingoophorectomy  74 yrs old    for pain,prolapse ; G3 P2  . Salpingoophorectomy  2002    for benign growths  . Hemorrhoid surgery  1970's  . Appendectomy  2002  . Cardiac catheterization  2005    neg  . Colonoscopy      negative   . Bone biopsy  11/06/10  . Breast lumpectomy  02/2010    right breast lumpectomy and sentinel node biopsy  . Upper gastrointestinal endoscopy      There were no vitals filed for this visit.  Visit Diagnosis:  Stiffness of joint, shoulder region, right  Right shoulder pain      Subjective Assessment - 05/21/14 1305    Subjective Has been dealing with transferring titles out of her late husband's name; nothing new with her health.   Currently in Pain? Yes   Pain Score 2    Pain Location Shoulder   Pain Orientation Right   Pain  Descriptors / Indicators Other (Comment)  "not too bad; just a little hurtin'"   Aggravating Factors  taking right arm up and back, like with putting a towel around to dry off   Pain Relieving Factors intophoresis or something                         [facility] Adult PT Treatment/Exercise - 05/21/14 0001    Shoulder Exercises: Supine   Other Supine Exercises supine over towel roll, active bilat. horizontal abduction x 5, slowly   Other Supine Exercises supine hooklying horizontal abduction prolonged stretch x 1 minute   Shoulder Exercises: Sidelying   External Rotation AROM;Right;10 reps   External Rotation Weight (lbs) 2# limited ROM (no pain)   ABduction AROM;10 reps  partial, painfree ROM only   Iontophoresis   Type of Iontophoresis Dexamethasone   Location right subacromial space   Dose 1 ml   Time 4-6 hr wear  Pt instructed regarding precautions and removal   Manual Therapy   Myofascial Release In left sidelying, crosshands technique to right side in vertical and diagonal there; soft tissue mobilization of left side.  Passive ROM Supine over towel roll, guided bilat. shoulder horizontal abduction stretch with stroking of right pect insertion   Other Manual Therapy Gentle right scapular mobilization in left sidelying, with emphasis on protraction and depression.                        [facility] Goals - 05/14/14 1403    CC Long Term Goal  #2   Status On-going   CC Long Term Goal  #3   Status On-going   CC Long Term Goal  #4   Baseline At least 20-30% improved as of 05/14/14.   Status On-going            Plan - 05/21/14 1755    Clinical Impression Statement Did well today and seems to be making gradual progress with decreased pain.   Pt will benefit from skilled therapeutic intervention in order to improve on the following deficits Decreased strength;Pain;Impaired UE functional use;Decreased range of motion   Rehab Potential Good    PT Frequency 2x / week   PT Duration 4 weeks   PT Treatment/Interventions Manual techniques;Therapeutic exercise;Other (comment)   PT Next Visit Plan Reasses ROM and goals.  Continue iontophoresis, manual therapy and therapeutic exercise.   Consulted and Agree with Plan of Care Patient        Problem List Patient Active Problem List   Diagnosis Date Noted  . Shoulder pain 04/17/2014  . PD (Parkinson's disease) 06/05/2013  . Tremor 05/18/2013  . Irritation of ear 05/18/2013  . Medicare annual wellness visit, initial 05/02/2012  . Neck pain 05/02/2012  . Osteoporosis 03/08/2011  . Neck mass 03/08/2011  . Breast cancer 11/19/2010  . DYSPEPSIA&OTHER SPEC DISORDERS FUNCTION STOMACH 08/01/2008  . DYSPHAGIA UNSPECIFIED 08/01/2008  . ORTHOSTATIC HYPOTENSION 06/28/2008  . GOITER, UNSPECIFIED 07/10/2007  . HYPERLIPIDEMIA 07/10/2007  . MONOCLONAL GAMMOPATHY 07/10/2007  . ANEMIA-NOS 07/10/2007  . FATIGUE 07/10/2007    SALISBURY,DONNA 05/21/2014, 5:58 PM  [place], [name], [address] Phone: [phone]   Fax:  [place], PT 05/21/2014 5:59 PM

## 2014-05-23 ENCOUNTER — Ambulatory Visit: Payer: Medicare Other | Admitting: Physical Therapy

## 2014-05-23 DIAGNOSIS — M25511 Pain in right shoulder: Secondary | ICD-10-CM

## 2014-05-23 DIAGNOSIS — R52 Pain, unspecified: Secondary | ICD-10-CM | POA: Diagnosis not present

## 2014-05-23 DIAGNOSIS — R29898 Other symptoms and signs involving the musculoskeletal system: Secondary | ICD-10-CM | POA: Diagnosis not present

## 2014-05-23 DIAGNOSIS — R279 Unspecified lack of coordination: Secondary | ICD-10-CM

## 2014-05-23 DIAGNOSIS — M25611 Stiffness of right shoulder, not elsewhere classified: Secondary | ICD-10-CM | POA: Diagnosis not present

## 2014-05-23 DIAGNOSIS — G20 Parkinson's disease: Secondary | ICD-10-CM

## 2014-05-23 DIAGNOSIS — R490 Dysphonia: Secondary | ICD-10-CM

## 2014-05-23 DIAGNOSIS — R293 Abnormal posture: Secondary | ICD-10-CM

## 2014-05-23 NOTE — Therapy (Signed)
[place], [name], [address] Phone: [phone]   Fax:  [phone]  Physical Therapy Treatment  Patient Details  Name: [name] MRN: [id number] Date of Birth: 05-20-1940 Referring Provider:  Rolm Bookbinder, MD  Encounter Date: 05/23/2014    Past Medical History  Diagnosis Date  . Esophageal Stricture   . Monoclonal gammopathy   . Anemia     Anemia-NOS / PMH, Dr Julien Nordmann  . Hyperlipidemia   . Vertical diplopia   . MVP (mitral valve prolapse)   . Cancer     stage I right breast  . Skin cancer     basal cell L neck  . Allergy     SEASONAL  . Arthritis   . Cataract     BILATERAL  . Osteoporosis     Past Surgical History  Procedure Laterality Date  . Total abdominal hysterectomy w/ bilateral salpingoophorectomy  74 yrs old    for pain,prolapse ; G3 P2  . Salpingoophorectomy  2002    for benign growths  . Hemorrhoid surgery  1970's  . Appendectomy  2002  . Cardiac catheterization  2005    neg  . Colonoscopy      negative   . Bone biopsy  11/06/10  . Breast lumpectomy  02/2010    right breast lumpectomy and sentinel node biopsy  . Upper gastrointestinal endoscopy      There were no vitals filed for this visit.  Visit Diagnosis:  Stiffness of joint, shoulder region, right  Right shoulder pain  Abnormal posture  Hypokinetic Parkinsonian dysphonia  Pain aggravated by changing postions  Lack of coordination      Subjective Assessment - 05/23/14 1301    Subjective "It's better today"     Currently in Pain? Yes   Pain Score 1    Pain Location Shoulder   Pain Orientation Right   Pain Descriptors / Indicators Aching   Pain Type Chronic pain   Pain Onset More than a month ago   Pain Frequency Intermittent   Aggravating Factors  putting hand in small of the back    Pain Relieving Factors somethings helping.  she takes tramadol  everynight for her neck pain    Effect of Pain on Daily Activities cant' put her bra on, can't use her towel to dry her back. had difficulty opening tight jars                         [facility] Adult PT Treatment/Exercise - 05/23/14 0001    Lumbar Exercises: Aerobic   Stationary Bike 5 mn  HrR 92 O2 sat 94   Lumbar Exercises: Seated   Other Seated Lumbar Exercises seated chop and lift 10 reps in each dirction    Lumbar Exercises: Supine   Other Supine Lumbar Exercises meeks spinal decompression exericise   Other Supine Lumbar Exercises lower trunk rotation    Knee/Hip Exercises: Standing   Lateral Step Up Left;10 reps;3 sets;Step Height: 6"   Wall Squat 10 reps  wall slides    Shoulder Exercises: Supine   External Rotation Strengthening;10 reps;Theraband   Theraband Level (Shoulder External Rotation) Level 3 (Green)   Flexion Strengthening;Both;Theraband;10 reps  narrow and wide grip   Theraband Level (Shoulder Flexion) Level 3 (Green)   ABduction Strengthening;10 reps;Theraband   Theraband Level (Shoulder ABduction) Level 3 (Green)   Shoulder Exercises: Standing   Extension 5 reps;Right  3 sets of 5 with  left hip extension   Retraction Both;Strengthening;10 reps;Theraband   Theraband Level (Shoulder Retraction) Level 2 (Red)   Shoulder Exercises: Therapy Ball   Other Therapy Ball Exercises closed chain weight bearring with small circles within painless range   Iontophoresis   Type of Iontophoresis Dexamethasone   Location right subacromial space   Dose 1 ml   Time 4-6 hr wear  Pt instructed regarding precautions and removal                        [facility] Goals - 05/23/14 1353    CC Long Term Goal  #2   Title Right shoulder active flexion to at least 135 degrees for improved overhead reach.   Status On-going   CC Long Term Goal  #3   Title Right shoulder active abduction to at least 140 degrees for improved ADLs.   Status On-going   CC Long Term Goal  #4   Title  Pt. will report decrease in right shoulder pain with movement of at least 60%.   Status On-going            Plan - 05/23/14 1702    Clinical Impression Statement Making progess with less pain and increased ability to do exercise today.  upgraded program to standing arm and core work and added recumbant bike   PT Next Visit Plan Reasses ROM and goals.  Continue iontophoresis, manual therapy and therapeutic exercise         Problem List Patient Active Problem List   Diagnosis Date Noted  . Shoulder pain 04/17/2014  . PD (Parkinson's disease) 06/05/2013  . Tremor 05/18/2013  . Irritation of ear 05/18/2013  . Medicare annual wellness visit, initial 05/02/2012  . Neck pain 05/02/2012  . Osteoporosis 03/08/2011  . Neck mass 03/08/2011  . Breast cancer 11/19/2010  . DYSPEPSIA&OTHER SPEC DISORDERS FUNCTION STOMACH 08/01/2008  . DYSPHAGIA UNSPECIFIED 08/01/2008  . ORTHOSTATIC HYPOTENSION 06/28/2008  . GOITER, UNSPECIFIED 07/10/2007  . HYPERLIPIDEMIA 07/10/2007  . MONOCLONAL GAMMOPATHY 07/10/2007  . ANEMIA-NOS 07/10/2007  . FATIGUE 07/10/2007   Donato Heinz. Owens Shark, PT  05/23/2014, 5:05 PM  [place], [name], [address] Phone: [phone]   Fax:  [phone]

## 2014-05-23 NOTE — Patient Instructions (Signed)
Over Head Pull: Narrow Grip     [place] [phone]   On back, knees bent, feet flat, band across thighs, elbows straight but relaxed. Pull hands apart (start). Keeping elbows straight, bring arms up and over head, hands toward floor. Keep pull steady on band. Hold momentarily. Return slowly, keeping pull steady, back to start. Repeat _10__ times. Band color _green_____   Side Pull: Double Arm   On back, knees bent, feet flat. Arms perpendicular to body, shoulder level, elbows straight but relaxed. Pull arms out to sides, elbows straight. Resistance band comes across collarbones, hands toward floor. Hold momentarily. Slowly return to starting position. Repeat 10___ times. Band color _green____   Sash   On back, knees bent, feet flat, left hand on left hip, right hand above left. Pull right arm DIAGONALLY (hip to shoulder) across chest. Bring right arm along head toward floor. Hold momentarily. Slowly return to starting position. Repeat _10__ times. Do with left arm. Band color _green_____   Shoulder Rotation: Double Arm   On back, knees bent, feet flat, elbows tucked at sides, bent 90, hands palms up. Pull hands apart and down toward floor, keeping elbows near sides. Hold momentarily. Slowly return to starting position. Repeat _10__ times. Band color _green_____

## 2014-05-28 ENCOUNTER — Ambulatory Visit: Payer: Medicare Other | Attending: General Surgery

## 2014-05-28 ENCOUNTER — Ambulatory Visit: Payer: Medicare Other | Admitting: Physical Therapy

## 2014-05-28 DIAGNOSIS — R279 Unspecified lack of coordination: Secondary | ICD-10-CM

## 2014-05-28 DIAGNOSIS — M25611 Stiffness of right shoulder, not elsewhere classified: Secondary | ICD-10-CM | POA: Insufficient documentation

## 2014-05-28 DIAGNOSIS — R29898 Other symptoms and signs involving the musculoskeletal system: Secondary | ICD-10-CM | POA: Diagnosis not present

## 2014-05-28 DIAGNOSIS — R52 Pain, unspecified: Secondary | ICD-10-CM | POA: Diagnosis not present

## 2014-05-28 DIAGNOSIS — G20 Parkinson's disease: Secondary | ICD-10-CM

## 2014-05-28 DIAGNOSIS — R293 Abnormal posture: Secondary | ICD-10-CM

## 2014-05-28 DIAGNOSIS — R490 Dysphonia: Secondary | ICD-10-CM

## 2014-05-28 DIAGNOSIS — M25511 Pain in right shoulder: Secondary | ICD-10-CM

## 2014-05-28 NOTE — Therapy (Signed)
[place], [name], [address] Phone: [phone]   Fax:  [phone]  Physical Therapy Treatment  Patient Details  Name: [name] MRN: [id number] Date of Birth: May 29, 1940 Referring Provider:  Tonia Ghent, MD  Encounter Date: 05/28/2014      PT End of Session - 05/28/14 1010    Visit Number 14   Number of Visits 20   Date for PT Re-Evaluation 05/31/14   PT Start Time 0937   PT Stop Time 1017   PT Time Calculation (min) 40 min      Past Medical History  Diagnosis Date  . Esophageal Stricture   . Monoclonal gammopathy   . Anemia     Anemia-NOS / PMH, Dr Julien Nordmann  . Hyperlipidemia   . Vertical diplopia   . MVP (mitral valve prolapse)   . Cancer     stage I right breast  . Skin cancer     basal cell L neck  . Allergy     SEASONAL  . Arthritis   . Cataract     BILATERAL  . Osteoporosis     Past Surgical History  Procedure Laterality Date  . Total abdominal hysterectomy w/ bilateral salpingoophorectomy  74 yrs old    for pain,prolapse ; G3 P2  . Salpingoophorectomy  2002    for benign growths  . Hemorrhoid surgery  1970's  . Appendectomy  2002  . Cardiac catheterization  2005    neg  . Colonoscopy      negative   . Bone biopsy  11/06/10  . Breast lumpectomy  02/2010    right breast lumpectomy and sentinel node biopsy  . Upper gastrointestinal endoscopy      There were no vitals filed for this visit.  Visit Diagnosis:  Stiffness of joint, shoulder region, right  Right shoulder pain  Abnormal posture  Hypokinetic Parkinsonian dysphonia  Pain aggravated by changing postions  Lack of coordination  Weakness of right arm      Subjective Assessment - 05/28/14 0940    Subjective My Rt shoulder is doing good, just feels tired today, doesnt hurt. The pain hasnt woken me up for several nights now. Mowed my lawn with my riding mower this weekend and it went fine  except I lost mo balance getting off and landed on the back of my Rt shoulder, but it doesnt hurt.   Currently in Pain? No/denies            [facility] PT Assessment - 05/28/14 0001    AROM   Right Shoulder Flexion 141 Degrees   Right Shoulder ABduction 126 Degrees                     [facility] Adult PT Treatment/Exercise - 05/28/14 0001    Lumbar Exercises: Aerobic   Stationary Bike Level 1, 5 mins  HR 90, SpO2 99% after   Lumbar Exercises: Supine   Other Supine Lumbar Exercises meeks spinal decompression exercises all 5 reps, 5 second holds   Other Supine Lumbar Exercises lower trunk rotation    Shoulder Exercises: Standing   Retraction Both;Strengthening;10 reps;Theraband  2 sets   Theraband Level (Shoulder Retraction) Level 2 (Red)   Other Standing Exercises Bil UE 2 way raises with 1 lb (flexion and scaptio, to 90 degrees) 10 reps each, (had pain with abduction so didnt do) no c/o pain.   Iontophoresis   Type of Iontophoresis Dexamethasone   Location right  subacromial space   Dose 1 ml   Time 4-6 hr wear  Pt instructed regarding removal time   Manual Therapy   Myofascial Release UE pulling throughout PROM   Passive ROM Supine to Rt shoulder into flexion, abduction and D2                        [facility] Goals - 05/28/14 1020    CC Long Term Goal  #2   Title Right shoulder active flexion to at least 135 degrees for improved overhead reach.  141 degrees acheived today 05/28/14   Status Achieved   CC Long Term Goal  #3   Title Right shoulder active abduction to at least 140 degrees for improved ADLs.  126 degrees achieved today 05/28/14   Status On-going            Plan - 05/28/14 1018    Clinical Impression Statement Pt continues with improved pain and has been able to sleep through the night past few days without pain waking her. Pt tolerated increased exercises today well and continues to show gains with ROM each week.    Pt will  benefit from skilled therapeutic intervention in order to improve on the following deficits Decreased strength;Pain;Impaired UE functional use;Decreased range of motion   Rehab Potential Good   Clinical Impairments Affecting Rehab Potential none   PT Frequency 2x / week   PT Duration 4 weeks   PT Treatment/Interventions Manual techniques;Therapeutic exercise;Other (comment)   PT Next Visit Plan Pt will need renewal or discharge next visit. Continue iontophoresis, manual therapy and therapeutic exercise    Consulted and Agree with Plan of Care Patient        Problem List Patient Active Problem List   Diagnosis Date Noted  . Shoulder pain 04/17/2014  . PD (Parkinson's disease) 06/05/2013  . Tremor 05/18/2013  . Irritation of ear 05/18/2013  . Medicare annual wellness visit, initial 05/02/2012  . Neck pain 05/02/2012  . Osteoporosis 03/08/2011  . Neck mass 03/08/2011  . Breast cancer 11/19/2010  . DYSPEPSIA&OTHER SPEC DISORDERS FUNCTION STOMACH 08/01/2008  . DYSPHAGIA UNSPECIFIED 08/01/2008  . ORTHOSTATIC HYPOTENSION 06/28/2008  . GOITER, UNSPECIFIED 07/10/2007  . HYPERLIPIDEMIA 07/10/2007  . MONOCLONAL GAMMOPATHY 07/10/2007  . ANEMIA-NOS 07/10/2007  . FATIGUE 07/10/2007    Otelia Limes, PTA 05/28/2014, 10:23 AM  [place], [name], [address] Phone: [phone]   Fax:  [phone]

## 2014-05-30 ENCOUNTER — Ambulatory Visit: Payer: Medicare Other

## 2014-05-30 DIAGNOSIS — R279 Unspecified lack of coordination: Secondary | ICD-10-CM

## 2014-05-30 DIAGNOSIS — R52 Pain, unspecified: Secondary | ICD-10-CM

## 2014-05-30 DIAGNOSIS — G20 Parkinson's disease: Secondary | ICD-10-CM

## 2014-05-30 DIAGNOSIS — M25611 Stiffness of right shoulder, not elsewhere classified: Secondary | ICD-10-CM | POA: Diagnosis not present

## 2014-05-30 DIAGNOSIS — R29898 Other symptoms and signs involving the musculoskeletal system: Secondary | ICD-10-CM

## 2014-05-30 DIAGNOSIS — M25511 Pain in right shoulder: Secondary | ICD-10-CM

## 2014-05-30 DIAGNOSIS — R293 Abnormal posture: Secondary | ICD-10-CM

## 2014-05-30 DIAGNOSIS — R490 Dysphonia: Secondary | ICD-10-CM

## 2014-05-30 NOTE — Therapy (Signed)
[facility] [address] [place], [place], [address] Phone: [phone]   Fax:  [phone]  Physical Therapy Treatment  Patient Details  Name: [name] MRN: [id number] Date of Birth: 03/01/1940 Referring Provider:  Duncan, Graham S, MD  Encounter Date: 05/30/2014      PT End of Session - 05/30/14 1351    Visit Number 15   Number of Visits 20   Date for PT Re-Evaluation 05/31/14   PT Start Time 1300   PT Stop Time 1350   PT Time Calculation (min) 50 min      Past Medical History  Diagnosis Date  . Esophageal Stricture   . Monoclonal gammopathy   . Anemia     Anemia-NOS / PMH, Dr Mohamed  . Hyperlipidemia   . Vertical diplopia   . MVP (mitral valve prolapse)   . Cancer     stage I right breast  . Skin cancer     basal cell L neck  . Allergy     SEASONAL  . Arthritis   . Cataract     BILATERAL  . Osteoporosis     Past Surgical History  Procedure Laterality Date  . Total abdominal hysterectomy w/ bilateral salpingoophorectomy  74 yrs old    for pain,prolapse ; G3 P2  . Salpingoophorectomy  74    for benign growths  . Hemorrhoid surgery  1970's  . Appendectomy  2002  . Cardiac catheterization  2005    neg  . Colonoscopy      negative   . Bone biopsy  11/06/10  . Breast lumpectomy  02/2010    right breast lumpectomy and sentinel node biopsy  . Upper gastrointestinal endoscopy      There were no vitals filed for this visit.  Visit Diagnosis:  Stiffness of joint, shoulder region, right  Right shoulder pain  Hypokinetic Parkinsonian dysphonia  Abnormal posture  Pain aggravated by changing postions  Lack of coordination  Weakness of right arm      Subjective Assessment - 05/30/14 1307    Subjective Started the lawnmower yesterday and havent been able to do that in over a year. Had no pain with it and it feels fine today, "In fact not even hurting now."   Currently in Pain?  No/denies                         [facility] Adult PT Treatment/Exercise - 05/30/14 0001    Shoulder Exercises: Standing   Retraction Both;Strengthening;Theraband;20 reps   Theraband Level (Shoulder Retraction) Level 2 (Red)   Other Standing Exercises Bil UE 2 way raises with 1 lb (flexion and scaptio, to 90 degrees) 10 reps each   Shoulder Exercises: Pulleys   Flexion 2 minutes   ABduction 2 minutes   Iontophoresis   Type of Iontophoresis Dexamethasone   Location right subacromial space   Dose 1 ml   Time 4-6 hr wear  Pt instructed regarding removal time.   Manual Therapy   Myofascial Release UE pulling throughout PROM   Passive ROM Supine to Rt shoulder into flexion, abduction and D2                PT Education - 05/30/14 1345    Education Details Issued green theraband for self progression of HEP after discharge   Person(s) Educated Patient   Methods Explanation   Comprehension Verbalized understanding                  Long Term Clinic Goals - 05/30/14 1347    CC Long Term Goal  #3   Title Right shoulder active abduction to at least 140 degrees for improved ADLs.  Rt shoulder abduction to 177 degrees (WNLs)   Status Achieved   CC Long Term Goal  #4   Title Pt. will report decrease in right shoulder pain with movement of at least 60%.  95-98% improved   Status Achieved            Plan - 05/30/14 1351    Clinical Impression Statement Pt has done fantastic with therapy and has met all her goals. Is compliant with HEP and has resumed working out at her gym, plans to go through the Parkinsons therapy at our [facility] next. Pt is ready for discharge.   Pt will benefit from skilled therapeutic intervention in order to improve on the following deficits Decreased strength;Pain;Impaired UE functional use;Decreased range of motion   Rehab Potential Good   Clinical Impairments Affecting Rehab Potential none   PT Frequency 2x / week   PT  Duration 4 weeks   PT Treatment/Interventions Manual techniques;Therapeutic exercise;Other (comment)   PT Next Visit Plan Discharge visit.   Consulted and Agree with Plan of Care Patient        Problem List Patient Active Problem List   Diagnosis Date Noted  . Shoulder pain 04/17/2014  . PD (Parkinson's disease) 06/05/2013  . Tremor 05/18/2013  . Irritation of ear 05/18/2013  . Medicare annual wellness visit, initial 05/02/2012  . Neck pain 05/02/2012  . Osteoporosis 03/08/2011  . Neck mass 03/08/2011  . Breast cancer 11/19/2010  . DYSPEPSIA&OTHER SPEC DISORDERS FUNCTION STOMACH 08/01/2008  . DYSPHAGIA UNSPECIFIED 08/01/2008  . ORTHOSTATIC HYPOTENSION 06/28/2008  . GOITER, UNSPECIFIED 07/10/2007  . HYPERLIPIDEMIA 07/10/2007  . MONOCLONAL GAMMOPATHY 07/10/2007  . ANEMIA-NOS 07/10/2007  . FATIGUE 07/10/2007    Rosenberger, Valerie Ann, PTA 05/30/2014, 1:54 PM   PHYSICAL THERAPY DISCHARGE SUMMARY  Visits from Start of Care: 15  Current functional level related to goals / functional outcomes: Independently managing self care   Remaining deficits: General deconditioning, occasional pain, postural dysfunction   Education / Equipment: Home exericise Plan: Patient agrees to discharge.  Patient goals were met. Patient is being discharged due to meeting the stated rehab goals.  ?????       Teresa K. Brown, PT  05/31/2014, 12:55 pm   [facility] [address] , [place], [address] Phone: [phone]   Fax:  [phone]      

## 2014-06-03 ENCOUNTER — Other Ambulatory Visit: Payer: Self-pay | Admitting: Internal Medicine

## 2014-06-04 ENCOUNTER — Encounter: Payer: Medicare Other | Admitting: Physical Therapy

## 2014-06-06 ENCOUNTER — Ambulatory Visit: Payer: Medicare Other | Admitting: Physical Therapy

## 2014-06-07 ENCOUNTER — Ambulatory Visit: Payer: Medicare Other | Admitting: Physical Therapy

## 2014-06-17 ENCOUNTER — Encounter: Payer: Self-pay | Admitting: Occupational Therapy

## 2014-06-17 ENCOUNTER — Ambulatory Visit: Payer: Medicare Other

## 2014-06-17 ENCOUNTER — Ambulatory Visit: Payer: Medicare Other | Admitting: Occupational Therapy

## 2014-06-17 DIAGNOSIS — M25611 Stiffness of right shoulder, not elsewhere classified: Secondary | ICD-10-CM | POA: Diagnosis not present

## 2014-06-17 DIAGNOSIS — R279 Unspecified lack of coordination: Secondary | ICD-10-CM

## 2014-06-17 DIAGNOSIS — G20 Parkinson's disease: Secondary | ICD-10-CM

## 2014-06-17 DIAGNOSIS — R258 Other abnormal involuntary movements: Secondary | ICD-10-CM

## 2014-06-17 DIAGNOSIS — R490 Dysphonia: Secondary | ICD-10-CM

## 2014-06-17 DIAGNOSIS — R29898 Other symptoms and signs involving the musculoskeletal system: Secondary | ICD-10-CM | POA: Diagnosis not present

## 2014-06-17 DIAGNOSIS — R52 Pain, unspecified: Secondary | ICD-10-CM | POA: Diagnosis not present

## 2014-06-17 NOTE — Therapy (Signed)
[facility] [address] [place], [name], [address] Phone: [phone]   Fax:  [phone]  Occupational Therapy Evaluation  Patient Details  Name: [name] MRN: [id number] Date of Birth: 11-30-1940 Referring Provider:  Tonia Ghent, MD  Encounter Date: 06/17/2014      OT End of Session - 06/17/14 1442    Visit Number 1   Number of Visits 13   Date for OT Re-Evaluation 07/29/14   Authorization Type Medicare/BCBS, G-code needed, FOTO   Authorization Time Period cert period 8/84/16-06/30/28   Authorization - Visit Number 1   Authorization - Number of Visits 10   OT Start Time 1147   OT Stop Time 1230   OT Time Calculation (min) 43 min   Activity Tolerance Patient tolerated treatment well   Behavior During Therapy [facility] for tasks assessed/performed      Past Medical History  Diagnosis Date  . Esophageal Stricture   . Monoclonal gammopathy   . Anemia     Anemia-NOS / PMH, Dr Julien Nordmann  . Hyperlipidemia   . Vertical diplopia   . MVP (mitral valve prolapse)   . Cancer     stage I right breast  . Skin cancer     basal cell L neck  . Allergy     SEASONAL  . Arthritis   . Cataract     BILATERAL  . Osteoporosis     Past Surgical History  Procedure Laterality Date  . Total abdominal hysterectomy w/ bilateral salpingoophorectomy  74 yrs old    for pain,prolapse ; G3 P2  . Salpingoophorectomy  2002    for benign growths  . Hemorrhoid surgery  1970's  . Appendectomy  2002  . Cardiac catheterization  2005    neg  . Colonoscopy      negative   . Bone biopsy  11/06/10  . Breast lumpectomy  02/2010    right breast lumpectomy and sentinel node biopsy  . Upper gastrointestinal endoscopy      There were no vitals filed for this visit.  Visit Diagnosis:  Bradykinesia  Lack of coordination      Subjective Assessment - 06/17/14 1152    Subjective  "I didn't do exercises at all last week, except  walking"   Pertinent History PD dx 2015, hx of breast CA with surgery 2012 (R side)   Patient Stated Goals improve ADLs   Currently in Pain? No/denies   Pain Score 4   0-9/10   Pain Location Shoulder   Pain Orientation Right   Pain Descriptors / Indicators Aching   Pain Type Chronic pain   Aggravating Factors  not doing stretching   Pain Relieving Factors stretching/exercises  OT will only address pain through HEP and monitor as pt just d/c'd from CA rehab which was addressing pain           [facility] OT Assessment - 06/17/14 0001    Assessment   Diagnosis Parkinson's disease   Onset Date --  2015 diagnosed   Prior Therapy has had PT for balance approx <1 year ago, finished CA rehab for RUE approx 1 week ago   Precautions   Precautions Fall   Balance Screen   Has the patient fallen in the past 6 months Yes   How many times? 3-4  but not to the floor/stumbled over   Has the patient had a decrease in activity level because of a fear of falling?  No   Is  the patient reluctant to leave their home because of a fear of falling?  No   Home  Environment   Family/patient expects to be discharged to: Private residence   Lives With [place] with basic ADLs;Independent with homemaking with ambulation   ADL   Eating/Feeding Modified independent  min difficulty with opening packages/containers   Grooming Modified independent  difficulty brushing back of hair   Upper Body Bathing Modified independent  has long handled bath brush, difficulty drying back   Lower Body Bathing Modified independent   Upper Body Dressing Increased time  difficulty pulling shirt down in back, buttons, jacket   Lower Body Dressing Increased time   Toilet Tranfer Modified independent   Toileting - Clothing Manipulation Modified independent   Toileting -  Hygiene Modified Independent   Tub/Shower Transfer Modified independent  difficulty stepping over tub, no  grab bar or seat   IADL   Shopping Takes care of all shopping needs independently   Light Housekeeping Maintains house alone or with occasional assistance  assist for yardwork, difficulty reaching in cabinet   Meal Prep --  mod I simple prep   Community Mobility Drives own vehicle   Medication Management Is responsible for taking medication in correct dosages at correct time   Physiological scientist financial matters independently (budgets, writes checks, pays rent, bills goes to bank), collects and keeps track of income   Mobility   Mobility Status Independent;History of falls  decr balance   Written Expression   Dominant Hand Right   Handwriting 100% legible   Vision - History   Baseline Vision Wears glasses all the time  hx of long term visual deficits R eye   Visual History Cataracts   Additional Comments avoids driving at night   Activity Tolerance   Activity Tolerance Tolerate 30+ min activity without fatigue  able to do activity for approx 1hr.   Cognition   Overall Cognitive Status Within Functional Limits for tasks assessed   Observation/Other Assessments   Standing Functional Reach Test 9.5 inches bilaterally   Other Surveys  Select   Physical Performance Test   Yes   Simulated Eating Time (seconds) 11.87   Donning Doffing Jacket Time (seconds) 13.87   Sensation   Light Touch Appears Intact  per pt report   Coordination   9 Hole Peg Test Right;Left   Right 9 Hole Peg Test 23.66   Left 9 Hole Peg Test 27.0   Box and Blocks R-56 blocks, L-49blocks   Other FOTO= 38.3   Tone   Assessment Location --  ? very minimal with AROM as described below   ROM / Strength   AROM / PROM / Strength AROM   AROM   Overall AROM  Within functional limits for tasks performed   Overall AROM Comments mild decr elbow extension bilaterally with full shoulder flex, unless cued, needed cues for wrist ext with finger ext with R hand                            OT Short Term Goals - 06/17/14 1542    OT SHORT TERM GOAL #1   Title Pt will be independent with PD-specific HEP.--check STGs 07/17/14   Time 4   Period Weeks   Status New   OT SHORT TERM GOAL #2   Title Pt will demo overhead reaching with full elbow extension without cues  in 10/10 trials with each UE.   Time 4   Period Weeks   Status New   OT SHORT TERM GOAL #3   Title Pt will verbalize understanding of ways to prevent future complications related to PD.   Time 4   Period Weeks   Status New           OT Long Term Goals - 2014/06/27 1543    OT LONG TERM GOAL #1   Title Pt will verbalize understanding of strategies to increase ease, safety, and independence with ADLs/IADLs prn.--check LTGs 08/01/14   Time 8   Period Weeks   Status New   OT LONG TERM GOAL #2   Title Pt will improve LUE functional reaching/coordination as shown by improving time on box and blocks test by at least 6.   Baseline R-56 blocks, L-49 blocks   Time 8   Period Weeks   Status New   OT LONG TERM GOAL #3   Title Pt will improve balance for ADLs/IADLs as shown by improving standing functional reach test by 1 inch bilaterally.   Baseline 9.5 bilaterally   Time 8   Period Weeks   Status New   OT LONG TERM GOAL #4   Title Pt will verbalize understanding of appropriate community resources prn.   Time 8   Period Weeks   Status New           Long Term Clinic Goals - 05/30/14 1347    CC Long Term Goal  #3   Title Right shoulder active abduction to at least 140 degrees for improved ADLs.  Rt shoulder abduction to 177 degrees (WNLs)   Status Achieved   CC Long Term Goal  #4   Title Pt. will report decrease in right shoulder pain with movement of at least 60%.  95-98% improved   Status Achieved            Plan - 27-Jun-2014 1537    Clinical Impression Statement Pt reports PD diagnosis in 2015 and presents today with mild bradykinesia, decreased coordination, and decreased balance for ADLs.  Pt  would benefit from occupational therapy to address deficits, for instruction in strategies for ADLs and PD-specific HEP, and prevent future complications.   Pt will benefit from skilled therapeutic intervention in order to improve on the following deficits (Retired) Decreased balance;Impaired perceived functional ability;Impaired UE functional use;Decreased activity tolerance;Decreased range of motion;Decreased strength;Impaired tone;Decreased coordination;Decreased knowledge of use of DME  bradykinesia/timing of movement   Rehab Potential Good   OT Frequency 2x / week   OT Duration 6 weeks  +eval   OT Treatment/Interventions Self-care/ADL training;Cryotherapy;Ultrasound;DME and/or AE instruction;Manual Therapy;Passive range of motion;Therapeutic activities;Balance training;Patient/family education;Therapeutic exercises;Functional Mobility Training;Fluidtherapy;Moist Heat;Neuromuscular education;Therapeutic exercise   Plan PWR! moves in sitting and quadraped if able   Recommended Other Services receiving speech therapy   Consulted and Agree with Plan of Care Patient          G-Codes - Jun 27, 2014 1547    Functional Assessment Tool Used needs cues for full elbow extension with overhead reaching, L box and blocks test:  49 blocks, standing functional reach test:  9.5 inches bilaterally, needs PD education   Functional Limitation Self care   Self Care Current Status (Z5638) At least 20 percent but less than 40 percent impaired, limited or restricted   Self Care Goal Status (V5643) At least 1 percent but less than 20 percent impaired, limited or restricted      Problem List Patient  Active Problem List   Diagnosis Date Noted  . Shoulder pain 04/17/2014  . PD (Parkinson's disease) 06/05/2013  . Tremor 05/18/2013  . Irritation of ear 05/18/2013  . Medicare annual wellness visit, initial 05/02/2012  . Neck pain 05/02/2012  . Osteoporosis 03/08/2011  . Neck mass 03/08/2011  . Breast cancer  11/19/2010  . DYSPEPSIA&OTHER SPEC DISORDERS FUNCTION STOMACH 08/01/2008  . DYSPHAGIA UNSPECIFIED 08/01/2008  . ORTHOSTATIC HYPOTENSION 06/28/2008  . GOITER, UNSPECIFIED 07/10/2007  . HYPERLIPIDEMIA 07/10/2007  . MONOCLONAL GAMMOPATHY 07/10/2007  . ANEMIA-NOS 07/10/2007  . FATIGUE 07/10/2007    [facility] 06/17/2014, 3:56 PM  [facility] [address] [place], [name], [address] Phone: [phone]   Fax:  [place], OTR/L 06/17/2014 3:56 PM

## 2014-06-17 NOTE — Therapy (Signed)
[facility] [address] [place], [name], [address] Phone: [phone]   Fax:  [phone]  Speech Language Pathology Evaluation  Patient Details  Name: [name] MRN: [id number] Date of Birth: 1940/07/06 Referring Provider:  Ludwig Clarks, DO  Encounter Date: 06/17/2014      End of Session - 06/17/14 1705    Visit Number 1   Number of Visits 16   Date for SLP Re-Evaluation 08/16/14   SLP Start Time 49   SLP Stop Time  2   SLP Time Calculation (min) 42 min   Activity Tolerance Patient tolerated treatment well      Past Medical History  Diagnosis Date  . Esophageal Stricture   . Monoclonal gammopathy   . Anemia     Anemia-NOS / PMH, Dr Julien Nordmann  . Hyperlipidemia   . Vertical diplopia   . MVP (mitral valve prolapse)   . Cancer     stage I right breast  . Skin cancer     basal cell L neck  . Allergy     SEASONAL  . Arthritis   . Cataract     BILATERAL  . Osteoporosis     Past Surgical History  Procedure Laterality Date  . Total abdominal hysterectomy w/ bilateral salpingoophorectomy  74 yrs old    for pain,prolapse ; G3 P2  . Salpingoophorectomy  2002    for benign growths  . Hemorrhoid surgery  1970's  . Appendectomy  2002  . Cardiac catheterization  2005    neg  . Colonoscopy      negative   . Bone biopsy  11/06/10  . Breast lumpectomy  02/2010    right breast lumpectomy and sentinel node biopsy  . Upper gastrointestinal endoscopy      There were no vitals filed for this visit.  Visit Diagnosis: Hypokinetic Parkinsonian dysphonia      Subjective Assessment - 06/17/14 1701    Subjective Pt lost her husbanc in March 2016 and reports he asked her to repeat, however pt does not report others ask her to repeat. No overt s/s dysphagia reported today.       10 minutes of conversational speech was reduced today, at average 68dB (WNL= average 70-72dB) with range of 62-70dB, when  a sound level meter was placed 30 cm away from pt's mouth. Overall intelligibility for this listener in a quiet environment was approx 100%. Production of loud /a/ averaged 83dB (range of 78 to 84) and usual mod cues needed for loudness. Pt rated effort level at 9/10 for production of loud /a/ (10=maximal effort).  Oral motor assessment revealed WNL lingual ROM and WNL lingual strength. Labial ROM was WNL and strength was WNL. Velar ROM appeared WNL. Lingual movement was incoordinated in alternative motion tasks but labial movement appeared WNL in those tasks.   After eval tasks, in 5 minutes simple conversation, pt was asked to use the same amount of effort as with loud /a/. Loudness average with this increased effort was 70dB (range of 68 to 75) with min verbal cues occasionally for loudness. Pt would benefit from skilled ST in order to improve speech intelligibility and pt's QOL.      SLP Evaluation [facility] - 06/17/14 1702    SLP Visit Information   SLP Received On 06/17/14   Medical Diagnosis Parkinson's Disease   Pain Assessment   Pain Score 4    Pain Location Shoulder   Pain Orientation Right   Pain  Type Chronic pain   Pain Relieving Factors "stretching it"   Cognition   Overall Cognitive Status Within Functional Limits for tasks assessed   Auditory Comprehension   Overall Auditory Comprehension Appears within functional limits for tasks assessed   Verbal Expression   Overall Verbal Expression Appears within functional limits for tasks assessed              SLP Education - 07/08/2014 1704    Education provided Yes   Education Details loud /a/, nature of Parkinson's on speech volume   Person(s) Educated Patient   Methods Explanation   Comprehension Verbalized understanding;Returned demonstration;Verbal cues required          SLP Short Term Goals - July 08, 2014 1707    SLP SHORT TERM GOAL #1   Title pt will produce loud /a/ at average 85dB over 3 therapy sessions   Time 4    Period Weeks   Status New   SLP SHORT TERM GOAL #2   Title pt will increase conversational loudness to average 70dB in 5 minutes simple conversation   Time 4   Period Weeks   Status New   SLP SHORT TERM GOAL #3   Title pt will improve loudness in 19/20 sentences to average 70dB   Time 4   Period Weeks   Status New          SLP Long Term Goals - 07/08/14 1709    SLP LONG TERM GOAL #1   Title pt will improve loudness in mod complex conversation of 5 minutes average 70dB, over 4 sessions   Time 8   Period Weeks   Status New   SLP LONG TERM GOAL #2   Title pt will improve loudenss in 7 minutes mod complex-complex conversation over 3 sessions, average 70dB, over 3 sessions in mod noisy environment   Time 8   Period Weeks   Status New   SLP LONG TERM GOAL #3   Title pt will improve breath support to adequate, in 7 minutes mod complex/complex conversation   Time 8   Period Weeks   Status New          Plan - 08-Jul-2014 1706    Clinical Impression Statement Pt presents with reduced speech volume due to Parkinson's Disease c/b reduced breath support. Skilled ST needed to improve breath support and incr pt's speech volume, most notably in louder environments   Speech Therapy Frequency 2x / week   Duration --  8 weeks   Treatment/Interventions Environmental controls;Compensatory techniques;Internal/external aids;SLP instruction and feedback;Functional tasks;Patient/family education   Potential to Achieve Goals Good          G-Codes - 2014/07/08 1711    Functional Assessment Tool Used noms   Functional Limitations Motor speech   Motor Speech Current Status [phone]) At least 20 percent but less than 40 percent impaired, limited or restricted   Motor Speech Goal Status (T0569) At least 1 percent but less than 20 percent impaired, limited or restricted      Problem List Patient Active Problem List   Diagnosis Date Noted  . Shoulder pain 04/17/2014  . PD (Parkinson's disease)  06/05/2013  . Tremor 05/18/2013  . Irritation of ear 05/18/2013  . Medicare annual wellness visit, initial 05/02/2012  . Neck pain 05/02/2012  . Osteoporosis 03/08/2011  . Neck mass 03/08/2011  . Breast cancer 11/19/2010  . DYSPEPSIA&OTHER SPEC DISORDERS FUNCTION STOMACH 08/01/2008  . DYSPHAGIA UNSPECIFIED 08/01/2008  . ORTHOSTATIC HYPOTENSION 06/28/2008  . GOITER, UNSPECIFIED  07/10/2007  . HYPERLIPIDEMIA 07/10/2007  . MONOCLONAL GAMMOPATHY 07/10/2007  . ANEMIA-NOS 07/10/2007  . FATIGUE 07/10/2007    SCHINKE,CARL , MS, CCC-SLP  06/17/2014, 5:14 PM  [facility] [address] [place], [name], [address] Phone: [phone]   Fax:  [phone]

## 2014-06-25 ENCOUNTER — Ambulatory Visit: Payer: Medicare Other | Admitting: Speech Pathology

## 2014-06-25 DIAGNOSIS — R29898 Other symptoms and signs involving the musculoskeletal system: Secondary | ICD-10-CM | POA: Diagnosis not present

## 2014-06-25 DIAGNOSIS — G20 Parkinson's disease: Secondary | ICD-10-CM

## 2014-06-25 DIAGNOSIS — R490 Dysphonia: Secondary | ICD-10-CM

## 2014-06-25 DIAGNOSIS — R52 Pain, unspecified: Secondary | ICD-10-CM | POA: Diagnosis not present

## 2014-06-25 DIAGNOSIS — M25611 Stiffness of right shoulder, not elsewhere classified: Secondary | ICD-10-CM | POA: Diagnosis not present

## 2014-06-25 NOTE — Patient Instructions (Signed)
Continue loud /a/, During phone conversation focus on breathing deep and being loud

## 2014-06-25 NOTE — Therapy (Signed)
[facility] [address] [place], [name], [address] Phone: [phone]   Fax:  [phone]  Speech Language Pathology Treatment  Patient Details  Name: [name] MRN: [id number] Date of Birth: 1940/04/16 Referring Provider:  Tonia Ghent, MD  Encounter Date: 06/25/2014      End of Session - 06/25/14 1144    Visit Number 2   Number of Visits 16   Date for SLP Re-Evaluation 08/16/14   SLP Start Time 1103   SLP Stop Time  1145   SLP Time Calculation (min) 42 min   Activity Tolerance Patient tolerated treatment well      Past Medical History  Diagnosis Date  . Esophageal Stricture   . Monoclonal gammopathy   . Anemia     Anemia-NOS / PMH, Dr Julien Nordmann  . Hyperlipidemia   . Vertical diplopia   . MVP (mitral valve prolapse)   . Cancer     stage I right breast  . Skin cancer     basal cell L neck  . Allergy     SEASONAL  . Arthritis   . Cataract     BILATERAL  . Osteoporosis     Past Surgical History  Procedure Laterality Date  . Total abdominal hysterectomy w/ bilateral salpingoophorectomy  74 yrs old    for pain,prolapse ; G3 P2  . Salpingoophorectomy  2002    for benign growths  . Hemorrhoid surgery  1970's  . Appendectomy  2002  . Cardiac catheterization  2005    neg  . Colonoscopy      negative   . Bone biopsy  11/06/10  . Breast lumpectomy  02/2010    right breast lumpectomy and sentinel node biopsy  . Upper gastrointestinal endoscopy      There were no vitals filed for this visit.  Visit Diagnosis: Hypokinetic Parkinsonian dysphonia      Subjective Assessment - 06/25/14 1111    Subjective "I'm not that bad, but he wants me to practice so it don't get worse"   Currently in Pain? No/denies               ADULT SLP TREATMENT - 06/25/14 1112    General Information   Behavior/Cognition Alert;Cooperative;Pleasant mood   Treatment Provided   Treatment provided  Cognitive-Linquistic   Cognitive-Linquistic Treatment   Treatment focused on Dysarthria   Skilled Treatment Sustained /a/ average of 85dB. Facilitated loud voice and pausing for big breaths during strcutured tasks with usual min A to project and get loud well as visual cues- cupping ear. Structured speech tasks average of 72dB. Simple conversation  average of 70dB with rare min A.   Assessment / Recommendations / Plan   Plan Continue with current plan of care   Progression Toward Goals   Progression toward goals Progressing toward goals          SLP Education - 06/25/14 1141    Education provided Yes   Education Details compensations for dysarthria   Person(s) Educated Patient   Methods Explanation   Comprehension Verbalized understanding;Returned demonstration;Verbal cues required          SLP Short Term Goals - 06/25/14 1144    SLP SHORT TERM GOAL #1   Title pt will produce loud /a/ at average 85dB over 3 therapy sessions   Time 3   Period Weeks   Status On-going   SLP SHORT TERM GOAL #2   Title pt will increase conversational loudness to  average 70dB in 5 minutes simple conversation   Time 3   Period Weeks   Status On-going   SLP SHORT TERM GOAL #3   Title pt will improve loudness in 19/20 sentences to average 70dB   Time 3   Period Weeks   Status On-going          SLP Long Term Goals - 06/25/14 1144    SLP LONG TERM GOAL #1   Title pt will improve loudness in mod complex conversation of 5 minutes average 70dB, over 4 sessions   Time 7   Period Weeks   Status On-going   SLP LONG TERM GOAL #2   Title pt will improve loudenss in 7 minutes mod complex-complex conversation over 3 sessions, average 70dB, over 3 sessions in mod noisy environment   Time 7   Period Weeks   Status On-going   SLP LONG TERM GOAL #3   Title pt will improve breath support to adequate, in 7 minutes mod complex/complex conversation   Time 7   Period Weeks   Status On-going           Plan - 06/25/14 1143    Clinical Impression Statement Continue skilled ST to maximize carryover of loud voice and breath support outside of therapy for improved itelligibility   Speech Therapy Frequency 2x / week   Treatment/Interventions Environmental controls;Compensatory techniques;Internal/external aids;SLP instruction and feedback;Functional tasks;Patient/family education   Potential to Achieve Goals Good        Problem List Patient Active Problem List   Diagnosis Date Noted  . Shoulder pain 04/17/2014  . PD (Parkinson's disease) 06/05/2013  . Tremor 05/18/2013  . Irritation of ear 05/18/2013  . Medicare annual wellness visit, initial 05/02/2012  . Neck pain 05/02/2012  . Osteoporosis 03/08/2011  . Neck mass 03/08/2011  . Breast cancer 11/19/2010  . DYSPEPSIA&OTHER SPEC DISORDERS FUNCTION STOMACH 08/01/2008  . DYSPHAGIA UNSPECIFIED 08/01/2008  . ORTHOSTATIC HYPOTENSION 06/28/2008  . GOITER, UNSPECIFIED 07/10/2007  . HYPERLIPIDEMIA 07/10/2007  . MONOCLONAL GAMMOPATHY 07/10/2007  . ANEMIA-NOS 07/10/2007  . FATIGUE 07/10/2007    Lovvorn, Annye Rusk MS, CCC-SLP 06/25/2014, 11:45 AM  [facility] [address] [place], [name], [address] Phone: [phone]   Fax:  [phone]

## 2014-06-28 ENCOUNTER — Ambulatory Visit: Payer: Medicare Other | Attending: General Surgery

## 2014-06-28 DIAGNOSIS — R490 Dysphonia: Secondary | ICD-10-CM | POA: Diagnosis not present

## 2014-06-28 DIAGNOSIS — M25511 Pain in right shoulder: Secondary | ICD-10-CM | POA: Insufficient documentation

## 2014-06-28 DIAGNOSIS — G20 Parkinson's disease: Secondary | ICD-10-CM

## 2014-06-28 DIAGNOSIS — R293 Abnormal posture: Secondary | ICD-10-CM | POA: Diagnosis not present

## 2014-06-28 DIAGNOSIS — M25611 Stiffness of right shoulder, not elsewhere classified: Secondary | ICD-10-CM | POA: Diagnosis not present

## 2014-06-28 DIAGNOSIS — R279 Unspecified lack of coordination: Secondary | ICD-10-CM | POA: Diagnosis not present

## 2014-06-28 DIAGNOSIS — R29898 Other symptoms and signs involving the musculoskeletal system: Secondary | ICD-10-CM | POA: Insufficient documentation

## 2014-06-28 DIAGNOSIS — R258 Other abnormal involuntary movements: Secondary | ICD-10-CM | POA: Diagnosis not present

## 2014-06-28 NOTE — Therapy (Signed)
[facility] [address] [place], [name], [address] Phone: [phone]   Fax:  [phone]  Speech Language Pathology Treatment  Patient Details  Name: [name] MRN: [id number] Date of Birth: 1940/09/30 Referring Provider:  Tonia Ghent, MD  Encounter Date: 06/28/2014      End of Session - 06/28/14 1416    Visit Number 3   Number of Visits 16   Date for SLP Re-Evaluation 08/16/14   SLP Start Time 1149   SLP Stop Time  1228   SLP Time Calculation (min) 39 min   Activity Tolerance Patient tolerated treatment well      Past Medical History  Diagnosis Date  . Esophageal Stricture   . Monoclonal gammopathy   . Anemia     Anemia-NOS / PMH, Dr Julien Nordmann  . Hyperlipidemia   . Vertical diplopia   . MVP (mitral valve prolapse)   . Cancer     stage I right breast  . Skin cancer     basal cell L neck  . Allergy     SEASONAL  . Arthritis   . Cataract     BILATERAL  . Osteoporosis     Past Surgical History  Procedure Laterality Date  . Total abdominal hysterectomy w/ bilateral salpingoophorectomy  74 yrs old    for pain,prolapse ; G3 P2  . Salpingoophorectomy  2002    for benign growths  . Hemorrhoid surgery  1970's  . Appendectomy  2002  . Cardiac catheterization  2005    neg  . Colonoscopy      negative   . Bone biopsy  11/06/10  . Breast lumpectomy  02/2010    right breast lumpectomy and sentinel node biopsy  . Upper gastrointestinal endoscopy      There were no vitals filed for this visit.  Visit Diagnosis: Hypokinetic Parkinsonian dysphonia             ADULT SLP TREATMENT - 06/28/14 1155    General Information   Behavior/Cognition Alert;Cooperative;Pleasant mood   Treatment Provided   Treatment provided Cognitive-Linquistic   Cognitive-Linquistic Treatment   Treatment focused on Dysarthria   Skilled Treatment SLP facilitated pt's loud /a/ with rare verbal cues initially,  fading to indepndent.  In conversation tasks (simple tomod complex) pt maintained speech WNL 78% of the time. In walking around clinic, SLP did not have any difficulty hearing pt and she was assumed to exhibit WNL volume in 7 minutes speech. Formal speech tasks (sentences) pt maintained 70dB or above 80% of the time. SLP discussed with pt external cues for her phone, in order to A pt incr loudness in that setting as she told SLP it was harder for her to improve loudness on the phone.   Assessment / Recommendations / Plan   Plan Continue with current plan of care   Progression Toward Goals   Progression toward goals Progressing toward goals          SLP Education - 06/28/14 1415    Education provided Yes   Education Details external cues (on phone) for improved loudness   Person(s) Educated Patient   Methods Explanation   Comprehension Verbalized understanding          SLP Short Term Goals - 06/28/14 1419    SLP SHORT TERM GOAL #1   Title pt will produce loud /a/ at average 85dB over 3 therapy sessions   Time 3   Period Weeks   Status On-going  SLP SHORT TERM GOAL #2   Title pt will increase conversational loudness to average 70dB in 5 minutes simple conversation   Status Achieved   SLP SHORT TERM GOAL #3   Title pt will improve loudness in 19/20 sentences to average 70dB   Time 3   Period Weeks   Status On-going          SLP Long Term Goals - 06/28/14 1419    SLP LONG TERM GOAL #1   Title (p) pt will improve loudness in mod complex conversation of 5 minutes average 70dB, over 4 sessions   Time (p) 7   Period (p) Weeks   Status (p) On-going   SLP LONG TERM GOAL #2   Title (p) pt will improve loudenss in 7 minutes mod complex-complex conversation over 3 sessions, average 70dB, over 3 sessions in mod noisy environment   Time (p) 7   Period (p) Weeks   Status (p) On-going   SLP LONG TERM GOAL #3   Title (p) pt will improve breath support to adequate, in 7 minutes mod  complex/complex conversation   Time (p) 7   Period (p) Weeks   Status (p) On-going          Plan - 06/28/14 1417    Clinical Impression Statement Pt with excellent success today i nconversation. Pt will need to work outside tx room most of the next session. If she is able to maintain adequate loudness in conversation, d/c should be considered in next 1-2 visits (to ensure consistency).   Speech Therapy Frequency 2x / week   Duration --  8 weeks   Treatment/Interventions Environmental controls;Compensatory techniques;Internal/external aids;SLP instruction and feedback;Functional tasks;Patient/family education   Potential to Achieve Goals Good        Problem List Patient Active Problem List   Diagnosis Date Noted  . Shoulder pain 04/17/2014  . PD (Parkinson's disease) 06/05/2013  . Tremor 05/18/2013  . Irritation of ear 05/18/2013  . Medicare annual wellness visit, initial 05/02/2012  . Neck pain 05/02/2012  . Osteoporosis 03/08/2011  . Neck mass 03/08/2011  . Breast cancer 11/19/2010  . DYSPEPSIA&OTHER SPEC DISORDERS FUNCTION STOMACH 08/01/2008  . DYSPHAGIA UNSPECIFIED 08/01/2008  . ORTHOSTATIC HYPOTENSION 06/28/2008  . GOITER, UNSPECIFIED 07/10/2007  . HYPERLIPIDEMIA 07/10/2007  . MONOCLONAL GAMMOPATHY 07/10/2007  . ANEMIA-NOS 07/10/2007  . FATIGUE 07/10/2007    Plumer Mittelstaedt , [place], [place]  06/28/2014, 2:35 PM  [facility] [address] [place], [name], [address] Phone: [phone]   Fax:  [phone]

## 2014-07-01 ENCOUNTER — Other Ambulatory Visit: Payer: Self-pay | Admitting: Family Medicine

## 2014-07-01 DIAGNOSIS — E785 Hyperlipidemia, unspecified: Secondary | ICD-10-CM

## 2014-07-01 DIAGNOSIS — E049 Nontoxic goiter, unspecified: Secondary | ICD-10-CM

## 2014-07-01 DIAGNOSIS — M810 Age-related osteoporosis without current pathological fracture: Secondary | ICD-10-CM

## 2014-07-02 ENCOUNTER — Ambulatory Visit: Payer: Medicare Other | Admitting: Speech Pathology

## 2014-07-02 DIAGNOSIS — R490 Dysphonia: Secondary | ICD-10-CM | POA: Diagnosis not present

## 2014-07-02 DIAGNOSIS — R258 Other abnormal involuntary movements: Secondary | ICD-10-CM | POA: Diagnosis not present

## 2014-07-02 DIAGNOSIS — M25611 Stiffness of right shoulder, not elsewhere classified: Secondary | ICD-10-CM | POA: Diagnosis not present

## 2014-07-02 DIAGNOSIS — R279 Unspecified lack of coordination: Secondary | ICD-10-CM | POA: Diagnosis not present

## 2014-07-02 DIAGNOSIS — R29898 Other symptoms and signs involving the musculoskeletal system: Secondary | ICD-10-CM | POA: Diagnosis not present

## 2014-07-02 DIAGNOSIS — M25511 Pain in right shoulder: Secondary | ICD-10-CM | POA: Diagnosis not present

## 2014-07-02 DIAGNOSIS — G20 Parkinson's disease: Secondary | ICD-10-CM

## 2014-07-02 NOTE — Therapy (Signed)
[facility] [address] [place], [name], [address] Phone: [phone]   Fax:  [phone]  Speech Language Pathology Treatment  Patient Details  Name: [name] MRN: [id number] Date of Birth: 01-Jan-1941 Referring Provider:  Tonia Ghent, MD  Encounter Date: 07/02/2014      End of Session - 07/02/14 0849    Visit Number 4   Number of Visits 16   Date for SLP Re-Evaluation 08/16/14      Past Medical History  Diagnosis Date  . Esophageal Stricture   . Monoclonal gammopathy   . Anemia     Anemia-NOS / PMH, Dr Julien Nordmann  . Hyperlipidemia   . Vertical diplopia   . MVP (mitral valve prolapse)   . Cancer     stage I right breast  . Skin cancer     basal cell L neck  . Allergy     SEASONAL  . Arthritis   . Cataract     BILATERAL  . Osteoporosis     Past Surgical History  Procedure Laterality Date  . Total abdominal hysterectomy w/ bilateral salpingoophorectomy  74 yrs old    for pain,prolapse ; G3 P2  . Salpingoophorectomy  2002    for benign growths  . Hemorrhoid surgery  1970's  . Appendectomy  2002  . Cardiac catheterization  2005    neg  . Colonoscopy      negative   . Bone biopsy  11/06/10  . Breast lumpectomy  02/2010    right breast lumpectomy and sentinel node biopsy  . Upper gastrointestinal endoscopy      There were no vitals filed for this visit.  Visit Diagnosis: Hypokinetic Parkinsonian dysphonia      Subjective Assessment - 07/02/14 0800    Subjective "since my husbands been gone it's kind of a nothing life"               ADULT SLP TREATMENT - 07/02/14 0801    General Information   Behavior/Cognition Alert;Cooperative;Pleasant mood   Pain Assessment   Pain Assessment 0-10   Pain Score 9    Pain Location Back of head   Pain Descriptors / Indicators Aching   Pain Intervention(s) RN gave pain meds during session;Monitored during session   Cognitive-Linquistic Treatment   Treatment focused on Dysarthria   Skilled Treatment Loud /a/ facilitated with rare min verbal instructions average of 86dB. Sentence generation with average of 76dB with rare min A to feel like she is talking  a little  too loud and feel like she is making effort to be loud. Conversation average of  75dB with min visual cues.  Facilitated  loud voice at conversation level walking around to the gym. Pt required occassional min A to maintain loudness during walking. Pt intellgibile the entire walk, although volume did decrease from clinic room.   Assessment / Recommendations / Plan   Plan Continue with current plan of care   Progression Toward Goals   Progression toward goals Progressing toward goals            SLP Short Term Goals - 07/02/14 0848    SLP SHORT TERM GOAL #1   Title pt will produce loud /a/ at average 85dB over 3 therapy sessions   Time 2   Period Weeks   Status Achieved   SLP SHORT TERM GOAL #2   Title pt will increase conversational loudness to average 70dB in 5 minutes simple conversation   Status  Achieved   SLP SHORT TERM GOAL #3   Title pt will improve loudness in 19/20 sentences to average 70dB   Time 3   Period Weeks   Status Achieved          SLP Long Term Goals - 07/02/14 0848    SLP LONG TERM GOAL #1   Title pt will improve loudness in mod complex conversation of 5 minutes average 70dB, over 4 sessions   Time 6   Period Weeks   Status On-going   SLP LONG TERM GOAL #2   Title pt will improve loudenss in 7 minutes mod complex-complex conversation over 3 sessions, average 70dB, over 3 sessions in mod noisy environment   Time 6   Period Weeks   Status On-going   SLP LONG TERM GOAL #3   Title pt will improve breath support to adequate, in 7 minutes mod complex/complex conversation   Time 6   Period Weeks   Status Achieved          Plan - 07/02/14 6606    Clinical Impression Statement Pt with good volume today,  requiring the most cues for loudness during walking and outside of therapy. Continue 1 more session to maximize carryover of loudness outside of therapy room.    Speech Therapy Frequency 2x / week   Treatment/Interventions Environmental controls;Compensatory techniques;Internal/external aids;SLP instruction and feedback;Functional tasks;Patient/family education   Potential to Achieve Goals Good        Problem List Patient Active Problem List   Diagnosis Date Noted  . Shoulder pain 04/17/2014  . PD (Parkinson's disease) 06/05/2013  . Tremor 05/18/2013  . Irritation of ear 05/18/2013  . Medicare annual wellness visit, initial 05/02/2012  . Neck pain 05/02/2012  . Osteoporosis 03/08/2011  . Neck mass 03/08/2011  . Breast cancer 11/19/2010  . DYSPEPSIA&OTHER SPEC DISORDERS FUNCTION STOMACH 08/01/2008  . DYSPHAGIA UNSPECIFIED 08/01/2008  . ORTHOSTATIC HYPOTENSION 06/28/2008  . Goiter 07/10/2007  . HLD (hyperlipidemia) 07/10/2007  . MONOCLONAL GAMMOPATHY 07/10/2007  . ANEMIA-NOS 07/10/2007  . FATIGUE 07/10/2007    Lovvorn, Annye Rusk  MS, CCC-SLP  07/02/2014, 8:49 AM  [facility] [address] [place], [name], [address] Phone: [phone]   Fax:  [phone]

## 2014-07-02 NOTE — Patient Instructions (Signed)
Continue loud /a/  Use external aid for loudness on the phone  Try to practice focused loud conversation

## 2014-07-03 ENCOUNTER — Ambulatory Visit: Payer: Medicare Other | Admitting: Occupational Therapy

## 2014-07-03 DIAGNOSIS — R490 Dysphonia: Secondary | ICD-10-CM | POA: Diagnosis not present

## 2014-07-03 DIAGNOSIS — R279 Unspecified lack of coordination: Secondary | ICD-10-CM

## 2014-07-03 DIAGNOSIS — R258 Other abnormal involuntary movements: Secondary | ICD-10-CM | POA: Diagnosis not present

## 2014-07-03 DIAGNOSIS — R29898 Other symptoms and signs involving the musculoskeletal system: Secondary | ICD-10-CM

## 2014-07-03 DIAGNOSIS — M25611 Stiffness of right shoulder, not elsewhere classified: Secondary | ICD-10-CM

## 2014-07-03 DIAGNOSIS — M25511 Pain in right shoulder: Secondary | ICD-10-CM | POA: Diagnosis not present

## 2014-07-03 NOTE — Therapy (Signed)
[facility] [address] [place], [name], [address] Phone: [phone]   Fax:  [phone]  Occupational Therapy Treatment  Patient Details  Name: [name] MRN: [id number] Date of Birth: 07/24/1940 Referring Provider:  Tonia Ghent, MD  Encounter Date: 07/03/2014      OT End of Session - 07/03/14 1041    Visit Number 2   Number of Visits 13   Date for OT Re-Evaluation 07/29/14   Authorization Type Medicare/BCBS, G-code needed, FOTO   Authorization Time Period cert period 5/42/70-07/18/74, week 1/6   Authorization - Visit Number 2   Authorization - Number of Visits 10   OT Start Time 1019   OT Stop Time 1100   OT Time Calculation (min) 41 min   Activity Tolerance Patient tolerated treatment well   Behavior During Therapy [facility] for tasks assessed/performed      Past Medical History  Diagnosis Date  . Esophageal Stricture   . Monoclonal gammopathy   . Anemia     Anemia-NOS / PMH, Dr Julien Nordmann  . Hyperlipidemia   . Vertical diplopia   . MVP (mitral valve prolapse)   . Cancer     stage I right breast  . Skin cancer     basal cell L neck  . Allergy     SEASONAL  . Arthritis   . Cataract     BILATERAL  . Osteoporosis     Past Surgical History  Procedure Laterality Date  . Total abdominal hysterectomy w/ bilateral salpingoophorectomy  74 yrs old    for pain,prolapse ; G3 P2  . Salpingoophorectomy  2002    for benign growths  . Hemorrhoid surgery  1970's  . Appendectomy  2002  . Cardiac catheterization  2005    neg  . Colonoscopy      negative   . Bone biopsy  11/06/10  . Breast lumpectomy  02/2010    right breast lumpectomy and sentinel node biopsy  . Upper gastrointestinal endoscopy      There were no vitals filed for this visit.  Visit Diagnosis:  Lack of coordination  Stiffness of joint, shoulder region, right  Right shoulder pain  Weakness of right arm      Subjective  Assessment - 07/03/14 1022    Subjective  Denies pain   Pertinent History PD dx 2015, hx of breast CA with surgery 2012 (R side)   Patient Stated Goals improve ADLs   Currently in Pain? No/denies                      OT Treatments/Exercises (OP) - 07/03/14 0001    Fine Motor Coordination   Fine Motor Coordination Flipping cards;Dealing card with thumb;Picking up coins;Manipulating coins;Stacking coins;Tossing ball  min v.c. for big movements bilaterally   Functional Reaching Activities   Mid Level Dynamic step and reach with bilateral UE's, to toss scarves to targets, min -mod v.c. for large amplitude movements and PWR! Hands, Ambulating while tossing scarves between hands, min v.c. for performance           PWR [facility]) - 07/03/14 1030    PWR! exercises Moves in sitting;Moves in [place]   [place]! Up 10   PWR! Rock 10   PWR! Twist 10   PWR! Step 10   Comments min v.c.   PWR! Up 10   PWR! Rock 10   PWR! Twist 10   PWR! Step 10   Comments min v.c.  OT Short Term Goals - 06/17/14 1542    OT SHORT TERM GOAL #1   Title Pt will be independent with PD-specific HEP.--check STGs 07/17/14   Time 4   Period Weeks   Status New   OT SHORT TERM GOAL #2   Title Pt will demo overhead reaching with full elbow extension without cues in 10/10 trials with each UE.   Time 4   Period Weeks   Status New   OT SHORT TERM GOAL #3   Title Pt will verbalize understanding of ways to prevent future complications related to PD.   Time 4   Period Weeks   Status New           OT Long Term Goals - 06/17/14 1543    OT LONG TERM GOAL #1   Title Pt will verbalize understanding of strategies to increase ease, safety, and independence with ADLs/IADLs prn.--check LTGs 08/01/14   Time 8   Period Weeks   Status New   OT LONG TERM GOAL #2   Title Pt will improve LUE functional reaching/coordination as shown by improving time on box and blocks test by at least 6.    Baseline R-56 blocks, L-49 blocks   Time 8   Period Weeks   Status New   OT LONG TERM GOAL #3   Title Pt will improve balance for ADLs/IADLs as shown by improving standing functional reach test by 1 inch bilaterally.   Baseline 9.5 bilaterally   Time 8   Period Weeks   Status New   OT LONG TERM GOAL #4   Title Pt will verbalize understanding of appropriate community resources prn.   Time 8   Period Weeks   Status New           Long Term Clinic Goals - 05/30/14 1347    CC Long Term Goal  #3   Title Right shoulder active abduction to at least 140 degrees for improved ADLs.  Rt shoulder abduction to 177 degrees (WNLs)   Status Achieved   CC Long Term Goal  #4   Title Pt. will report decrease in right shoulder pain with movement of at least 60%.  95-98% improved   Status Achieved            Plan - 07/03/14 1143    Clinical Impression Statement Pt is progressing towards goals. She demonstrates good understanding of PWR! moves in seated/quadraped.   Pt will benefit from skilled therapeutic intervention in order to improve on the following deficits (Retired) Decreased balance;Impaired perceived functional ability;Impaired UE functional use;Decreased activity tolerance;Decreased range of motion;Decreased strength;Impaired tone;Decreased coordination;Decreased knowledge of use of DME   Rehab Potential Good   OT Frequency 2x / week   OT Duration 6 weeks   OT Treatment/Interventions Self-care/ADL training;Cryotherapy;Ultrasound;DME and/or AE instruction;Manual Therapy;Passive range of motion;Therapeutic activities;Balance training;Patient/family education;Therapeutic exercises;Functional Mobility Training;Fluidtherapy;Moist Heat;Neuromuscular education;Therapeutic exercise   Plan Big movements with ADLs, Reinforce/ progress HEP.   OT Home Exercise Plan issued : PWR! moves in seated, and quadraped   Consulted and Agree with Plan of Care Patient        Problem List Patient  Active Problem List   Diagnosis Date Noted  . Shoulder pain 04/17/2014  . PD (Parkinson's disease) 06/05/2013  . Tremor 05/18/2013  . Irritation of ear 05/18/2013  . Medicare annual wellness visit, initial 05/02/2012  . Neck pain 05/02/2012  . Osteoporosis 03/08/2011  . Neck mass 03/08/2011  . Breast cancer 11/19/2010  . DYSPEPSIA&OTHER SPEC  DISORDERS FUNCTION STOMACH 08/01/2008  . DYSPHAGIA UNSPECIFIED 08/01/2008  . ORTHOSTATIC HYPOTENSION 06/28/2008  . Goiter 07/10/2007  . HLD (hyperlipidemia) 07/10/2007  . MONOCLONAL GAMMOPATHY 07/10/2007  . ANEMIA-NOS 07/10/2007  . FATIGUE 07/10/2007    RINE,KATHRYN 07/03/2014, 11:51 AM Theone Murdoch, OTR/L [phone] Phone: [phone] 11:51 AM 07/03/2014 [facility] [address] [place], [name], [address] Phone: [phone]   Fax:  [phone]

## 2014-07-04 ENCOUNTER — Ambulatory Visit: Payer: Medicare Other | Admitting: Occupational Therapy

## 2014-07-04 VITALS — BP 106/58

## 2014-07-04 DIAGNOSIS — R29898 Other symptoms and signs involving the musculoskeletal system: Secondary | ICD-10-CM | POA: Diagnosis not present

## 2014-07-04 DIAGNOSIS — R490 Dysphonia: Secondary | ICD-10-CM | POA: Diagnosis not present

## 2014-07-04 DIAGNOSIS — R279 Unspecified lack of coordination: Secondary | ICD-10-CM | POA: Diagnosis not present

## 2014-07-04 DIAGNOSIS — R293 Abnormal posture: Secondary | ICD-10-CM

## 2014-07-04 DIAGNOSIS — M25511 Pain in right shoulder: Secondary | ICD-10-CM

## 2014-07-04 DIAGNOSIS — M25611 Stiffness of right shoulder, not elsewhere classified: Secondary | ICD-10-CM

## 2014-07-04 DIAGNOSIS — R258 Other abnormal involuntary movements: Secondary | ICD-10-CM | POA: Diagnosis not present

## 2014-07-04 NOTE — Patient Instructions (Addendum)

## 2014-07-04 NOTE — Therapy (Signed)
[facility] [address] [place], [name], [address] Phone: [phone]   Fax:  [phone]  Occupational Therapy Treatment  Patient Details  Name: [name] MRN: [id number] Date of Birth: Oct 29, 1940 Referring Provider:  Tonia Ghent, MD  Encounter Date: 07/04/2014      OT End of Session - 07/04/14 1513    Visit Number 3   Number of Visits 13   Date for OT Re-Evaluation 07/29/14   Authorization Type Medicare/BCBS, G-code needed, FOTO   Authorization Time Period cert period 02/08/70-07/14/33, week 1/6   Authorization - Visit Number 3   Authorization - Number of Visits 10   OT Start Time 1447   OT Stop Time 1530   OT Time Calculation (min) 43 min   Activity Tolerance Patient tolerated treatment well   Behavior During Therapy [facility] for tasks assessed/performed      Past Medical History  Diagnosis Date  . Esophageal Stricture   . Monoclonal gammopathy   . Anemia     Anemia-NOS / PMH, Dr Julien Nordmann  . Hyperlipidemia   . Vertical diplopia   . MVP (mitral valve prolapse)   . Cancer     stage I right breast  . Skin cancer     basal cell L neck  . Allergy     SEASONAL  . Arthritis   . Cataract     BILATERAL  . Osteoporosis     Past Surgical History  Procedure Laterality Date  . Total abdominal hysterectomy w/ bilateral salpingoophorectomy  74 yrs old    for pain,prolapse ; G3 P2  . Salpingoophorectomy  2002    for benign growths  . Hemorrhoid surgery  1970's  . Appendectomy  2002  . Cardiac catheterization  2005    neg  . Colonoscopy      negative   . Bone biopsy  11/06/10  . Breast lumpectomy  02/2010    right breast lumpectomy and sentinel node biopsy  . Upper gastrointestinal endoscopy      Filed Vitals:   07/04/14 1448  BP: 106/58    Visit Diagnosis:  Lack of coordination  Stiffness of joint, shoulder region, right  Right shoulder pain  Weakness of right  arm  Bradykinesia  Abnormal posture      Subjective Assessment - 07/04/14 1448    Pertinent History PD dx 2015, hx of breast CA with surgery 2012 (R side)   Patient Stated Goals improve ADLs   Currently in Pain? Yes   Pain Score 1    Pain Location Head   Pain Orientation Posterior   Pain Type Chronic pain   Pain Onset More than a month ago   Pain Frequency Intermittent   Aggravating Factors  unknown   Pain Relieving Factors unknown                      OT Treatments/Exercises (OP) - 07/04/14 0001    ADLs   Overall ADLs Education provided regarding big movements with ADLs,. Handout provided.   Functional Reaching Activities   Mid Level Dynamic step and reach, to copy small peg design for increased fine motor coordination and cognitive component, bilateral UE/s min v.c. for PWR! hands and large amplitude movements. Removing pegs with LUE, emphasis on in hand manipulation/ translation of pegs.           P[facility]) - 07/04/14 1515    PWR! exercises Moves in sitting;Moves in [place]!  Up 10   PWR! Rock 10   PWR! Twist 10   PWR! Step 10   PWR! Up 10   PWR! Rock 10   PWR! Twist 10   PWR! Step 10             OT Education - 07/04/14 2102    Education provided Yes   Education Details PWR! seated/ quadraped, big movements with ADLs   Person(s) Educated Patient   Methods Explanation;Demonstration;Verbal cues;Handout   Comprehension Verbalized understanding;Returned demonstration          OT Short Term Goals - 06/17/14 1542    OT SHORT TERM GOAL #1   Title Pt will be independent with PD-specific HEP.--check STGs 07/17/14   Time 4   Period Weeks   Status New   OT SHORT TERM GOAL #2   Title Pt will demo overhead reaching with full elbow extension without cues in 10/10 trials with each UE.   Time 4   Period Weeks   Status New   OT SHORT TERM GOAL #3   Title Pt will verbalize understanding of ways to prevent future complications related to  PD.   Time 4   Period Weeks   Status New           OT Long Term Goals - 06/17/14 1543    OT LONG TERM GOAL #1   Title Pt will verbalize understanding of strategies to increase ease, safety, and independence with ADLs/IADLs prn.--check LTGs 08/01/14   Time 8   Period Weeks   Status New   OT LONG TERM GOAL #2   Title Pt will improve LUE functional reaching/coordination as shown by improving time on box and blocks test by at least 6.   Baseline R-56 blocks, L-49 blocks   Time 8   Period Weeks   Status New   OT LONG TERM GOAL #3   Title Pt will improve balance for ADLs/IADLs as shown by improving standing functional reach test by 1 inch bilaterally.   Baseline 9.5 bilaterally   Time 8   Period Weeks   Status New   OT LONG TERM GOAL #4   Title Pt will verbalize understanding of appropriate community resources prn.   Time 8   Period Weeks   Status New           Long Term Clinic Goals - 05/30/14 1347    CC Long Term Goal  #3   Title Right shoulder active abduction to at least 140 degrees for improved ADLs.  Rt shoulder abduction to 177 degrees (WNLs)   Status Achieved   CC Long Term Goal  #4   Title Pt. will report decrease in right shoulder pain with movement of at least 60%.  95-98% improved   Status Achieved            Plan - 07/04/14 1511    Clinical Impression Statement Pt is progressing towards goals. She demonstrates improving carryover of large amplitude movements.   Pt will benefit from skilled therapeutic intervention in order to improve on the following deficits (Retired) Decreased balance;Impaired perceived functional ability;Impaired UE functional use;Decreased activity tolerance;Decreased range of motion;Decreased strength;Impaired tone;Decreased coordination;Decreased knowledge of use of DME   Rehab Potential Good   OT Frequency 2x / week   OT Duration 6 weeks   OT Treatment/Interventions Self-care/ADL training;Cryotherapy;Ultrasound;DME and/or AE  instruction;Manual Therapy;Passive range of motion;Therapeutic activities;Balance training;Patient/family education;Therapeutic exercises;Functional Mobility Training;Fluidtherapy;Moist Heat;Neuromuscular education;Therapeutic exercise   Plan functional activity with large amplitude movements  OT Home Exercise Plan issued : PWR! moves in seated, and quadraped 07/03/14, big movements with ADLs 07/04/14   Consulted and Agree with Plan of Care Patient        Problem List Patient Active Problem List   Diagnosis Date Noted  . Shoulder pain 04/17/2014  . PD (Parkinson's disease) 06/05/2013  . Tremor 05/18/2013  . Irritation of ear 05/18/2013  . Medicare annual wellness visit, initial 05/02/2012  . Neck pain 05/02/2012  . Osteoporosis 03/08/2011  . Neck mass 03/08/2011  . Breast cancer 11/19/2010  . DYSPEPSIA&OTHER SPEC DISORDERS FUNCTION STOMACH 08/01/2008  . DYSPHAGIA UNSPECIFIED 08/01/2008  . ORTHOSTATIC HYPOTENSION 06/28/2008  . Goiter 07/10/2007  . HLD (hyperlipidemia) 07/10/2007  . MONOCLONAL GAMMOPATHY 07/10/2007  . ANEMIA-NOS 07/10/2007  . FATIGUE 07/10/2007    Rocsi Hazelbaker 07/04/2014, 9:08 PM Theone Murdoch, OTR/L [phone] Phone: [phone] 9:08 PM 07/04/2014 [facility] [address] [place], [name], [address] Phone: [phone]   Fax:  [phone]

## 2014-07-06 ENCOUNTER — Other Ambulatory Visit: Payer: Self-pay | Admitting: Internal Medicine

## 2014-07-10 ENCOUNTER — Ambulatory Visit: Payer: Medicare Other

## 2014-07-10 ENCOUNTER — Ambulatory Visit: Payer: Medicare Other | Admitting: Occupational Therapy

## 2014-07-10 DIAGNOSIS — R29898 Other symptoms and signs involving the musculoskeletal system: Secondary | ICD-10-CM | POA: Diagnosis not present

## 2014-07-10 DIAGNOSIS — G20 Parkinson's disease: Secondary | ICD-10-CM

## 2014-07-10 DIAGNOSIS — R490 Dysphonia: Secondary | ICD-10-CM

## 2014-07-10 DIAGNOSIS — M25611 Stiffness of right shoulder, not elsewhere classified: Secondary | ICD-10-CM | POA: Diagnosis not present

## 2014-07-10 DIAGNOSIS — M25511 Pain in right shoulder: Secondary | ICD-10-CM

## 2014-07-10 DIAGNOSIS — R258 Other abnormal involuntary movements: Secondary | ICD-10-CM

## 2014-07-10 DIAGNOSIS — R279 Unspecified lack of coordination: Secondary | ICD-10-CM

## 2014-07-10 NOTE — Therapy (Signed)
[facility] [address] [place], [name], [address] Phone: [phone]   Fax:  [phone]  Occupational Therapy Treatment  Patient Details  Name: [name] MRN: [id number] Date of Birth: 1940/10/08 Referring Provider:  Tonia Ghent, MD  Encounter Date: 07/10/2014      OT End of Session - 07/10/14 1732    Visit Number 4   Number of Visits 13   Date for OT Re-Evaluation 07/29/14   Authorization Type Medicare/BCBS, G-code needed, FOTO   Authorization Time Period cert period 7/41/63-8/45/36, week 1/6   Authorization - Visit Number 4   Authorization - Number of Visits 10   OT Start Time 0804   OT Stop Time 0845   OT Time Calculation (min) 41 min   Activity Tolerance Patient tolerated treatment well   Behavior During Therapy [facility] for tasks assessed/performed      Past Medical History  Diagnosis Date  . Esophageal Stricture   . Monoclonal gammopathy   . Anemia     Anemia-NOS / PMH, Dr Julien Nordmann  . Hyperlipidemia   . Vertical diplopia   . MVP (mitral valve prolapse)   . Cancer     stage I right breast  . Skin cancer     basal cell L neck  . Allergy     SEASONAL  . Arthritis   . Cataract     BILATERAL  . Osteoporosis     Past Surgical History  Procedure Laterality Date  . Total abdominal hysterectomy w/ bilateral salpingoophorectomy  74 yrs old    for pain,prolapse ; G3 P2  . Salpingoophorectomy  2002    for benign growths  . Hemorrhoid surgery  1970's  . Appendectomy  2002  . Cardiac catheterization  2005    neg  . Colonoscopy      negative   . Bone biopsy  11/06/10  . Breast lumpectomy  02/2010    right breast lumpectomy and sentinel node biopsy  . Upper gastrointestinal endoscopy      There were no vitals filed for this visit.  Visit Diagnosis:  Weakness of right arm  Bradykinesia  Stiffness of joint, shoulder region, right  Lack of coordination  Right shoulder pain       Subjective Assessment - 07/10/14 0816    Subjective  mild right chest pain, hx of masectomy   Pertinent History PD dx 2015, hx of breast CA with surgery 2012 (R side)   Patient Stated Goals improve ADLs   Currently in Pain? Yes   Pain Score 4    Pain Location Chest   Pain Orientation Right   Pain Descriptors / Indicators Aching   Pain Type Chronic pain   Pain Onset More than a month ago   Pain Frequency Intermittent   Aggravating Factors  activity   Pain Relieving Factors rest                      OT Treatments/Exercises (OP) - 07/10/14 0001    Fine Motor Coordination   Fine Motor Coordination Flipping cards;Dealing card with thumb;Picking up coins;Manipulating coins;Stacking coins;Tossing ball  issued coordination HEP, min v.c. for PWR! Hands, see pt instructions   Flipping cards bilateral hands   Dealing card with thumb bilateral hands   Picking up coins bilateral hands   Manipulating coins bilateral hands   Stacking coins bilateral hands   Tossing ball bilateral hands  P[facility]) - 07/10/14 1733    PWR! exercises Moves in supine;Moves in standing   PWR! Up 10   PWR! Rock 20   PWR! Twist 20   PWR! Step 20   Comments demonstration, min v.c. for large amplitude movements, finger extension   PWR! Up 10   PWR! Rock 20   PWR! Twist 20   PWR Step 20   Comments min v.c./ demonstration for large amplitude movements             OT Education - 07/10/14 0815    Education provided Yes   Education Details PWR! supine, and standing, coordination HEP   Person(s) Educated Patient   Methods Explanation;Demonstration;Verbal cues;Handout   Comprehension Verbalized understanding;Returned demonstration;Verbal cues required          OT Short Term Goals - 06/17/14 1542    OT SHORT TERM GOAL #1   Title Pt will be independent with PD-specific HEP.--check STGs 07/17/14   Time 4   Period Weeks   Status New   OT SHORT TERM GOAL #2   Title Pt will  demo overhead reaching with full elbow extension without cues in 10/10 trials with each UE.   Time 4   Period Weeks   Status New   OT SHORT TERM GOAL #3   Title Pt will verbalize understanding of ways to prevent future complications related to PD.   Time 4   Period Weeks   Status New           OT Long Term Goals - 06/17/14 1543    OT LONG TERM GOAL #1   Title Pt will verbalize understanding of strategies to increase ease, safety, and independence with ADLs/IADLs prn.--check LTGs 08/01/14   Time 8   Period Weeks   Status New   OT LONG TERM GOAL #2   Title Pt will improve LUE functional reaching/coordination as shown by improving time on box and blocks test by at least 6.   Baseline R-56 blocks, L-49 blocks   Time 8   Period Weeks   Status New   OT LONG TERM GOAL #3   Title Pt will improve balance for ADLs/IADLs as shown by improving standing functional reach test by 1 inch bilaterally.   Baseline 9.5 bilaterally   Time 8   Period Weeks   Status New   OT LONG TERM GOAL #4   Title Pt will verbalize understanding of appropriate community resources prn.   Time 8   Period Weeks   Status New               Plan - 07/10/14 6063    Clinical Impression Statement Pt is progressing towards goals. She demonstrates good understanding of HEP and reponds well to verbal cues for large amplitude movements.   Pt will benefit from skilled therapeutic intervention in order to improve on the following deficits (Retired) Decreased balance;Impaired perceived functional ability;Impaired UE functional use;Decreased activity tolerance;Decreased range of motion;Decreased strength;Impaired tone;Decreased coordination;Decreased knowledge of use of DME   Rehab Potential Good   OT Frequency 2x / week   OT Duration 6 weeks   OT Treatment/Interventions Self-care/ADL training;Cryotherapy;Ultrasound;DME and/or AE instruction;Manual Therapy;Passive range of motion;Therapeutic activities;Balance  training;Patient/family education;Therapeutic exercises;Functional Mobility Training;Fluidtherapy;Moist Heat;Neuromuscular education;Therapeutic exercise   Plan dynamic step and reach/ functional activity with large amplitude movements, fine motor coordination(? grooved pegs)   OT Home Exercise Plan issued : PWR! moves in seated, and quadraped 07/03/14, big movements with ADLs 07/04/14, PWR! standing and supine,  coordination HEP issued 07/10/14   Consulted and Agree with Plan of Care Patient        Problem List Patient Active Problem List   Diagnosis Date Noted  . Shoulder pain 04/17/2014  . PD (Parkinson's disease) 06/05/2013  . Tremor 05/18/2013  . Irritation of ear 05/18/2013  . Medicare annual wellness visit, initial 05/02/2012  . Neck pain 05/02/2012  . Osteoporosis 03/08/2011  . Neck mass 03/08/2011  . Breast cancer 11/19/2010  . DYSPEPSIA&OTHER SPEC DISORDERS FUNCTION STOMACH 08/01/2008  . DYSPHAGIA UNSPECIFIED 08/01/2008  . ORTHOSTATIC HYPOTENSION 06/28/2008  . Goiter 07/10/2007  . HLD (hyperlipidemia) 07/10/2007  . MONOCLONAL GAMMOPATHY 07/10/2007  . ANEMIA-NOS 07/10/2007  . FATIGUE 07/10/2007    Khing Belcher 07/10/2014, 5:37 PM Theone Murdoch, OTR/L [phone] Phone: [phone] 5:37 PM 07/10/2014 [facility] [address] [place], [name], [address] Phone: [phone]   Fax:  [phone]

## 2014-07-10 NOTE — Patient Instructions (Signed)
Do loud /a/ 5 times, twice a day.

## 2014-07-10 NOTE — Therapy (Signed)
[facility] [address] [place], [name], [address] Phone: [phone]   Fax:  [phone]  Speech Language Pathology Treatment  Patient Details  Name: [name] MRN: [id number] Date of Birth: 11-19-1940 Referring Provider:  Tonia Ghent, MD  Encounter Date: 07/10/2014      End of Session - 07/10/14 0933    Visit Number 5   Number of Visits 16   Date for SLP Re-Evaluation 08/16/14   SLP Start Time 0851   SLP Stop Time  0932   SLP Time Calculation (min) 41 min   Activity Tolerance Patient tolerated treatment well      Past Medical History  Diagnosis Date  . Esophageal Stricture   . Monoclonal gammopathy   . Anemia     Anemia-NOS / PMH, Dr Julien Nordmann  . Hyperlipidemia   . Vertical diplopia   . MVP (mitral valve prolapse)   . Cancer     stage I right breast  . Skin cancer     basal cell L neck  . Allergy     SEASONAL  . Arthritis   . Cataract     BILATERAL  . Osteoporosis     Past Surgical History  Procedure Laterality Date  . Total abdominal hysterectomy w/ bilateral salpingoophorectomy  74 yrs old    for pain,prolapse ; G3 P2  . Salpingoophorectomy  2002    for benign growths  . Hemorrhoid surgery  1970's  . Appendectomy  2002  . Cardiac catheterization  2005    neg  . Colonoscopy      negative   . Bone biopsy  11/06/10  . Breast lumpectomy  02/2010    right breast lumpectomy and sentinel node biopsy  . Upper gastrointestinal endoscopy      There were no vitals filed for this visit.  Visit Diagnosis: Hypokinetic Parkinsonian dysphonia             ADULT SLP TREATMENT - 07/10/14 0857    General Information   Behavior/Cognition Alert;Cooperative;Pleasant mood   Treatment Provided   Treatment provided Cognitive-Linquistic   Pain Assessment   Pain Assessment 0-10   Pain Score 2    Pain Location rt shoulder   Pain Descriptors / Indicators Aching   Pain Intervention(s)  Monitored during session   Cognitive-Linquistic Treatment   Treatment focused on Dysarthria   Skilled Treatment Loud /a/ with average 88dB.SLP shaped loud /a/ for more diaphragmatic push than laryngeal effort. Pt successful after approx 15 minutes of SLP shaping. Pt maintained volume at or above 70dB for entire mod complex/complex conversation of 15 minutes.    Assessment / Recommendations / Plan   Plan Continue with current plan of care   Progression Toward Goals   Progression toward goals Goals met, education completed, patient discharged from [facility] - 07/10/14 0933    Education Details loud /a/   Person(s) Educated Patient   Methods Explanation;Demonstration;Verbal cues   Comprehension Verbalized understanding;Returned demonstration;Verbal cues required          SLP Short Term Goals - 07/02/14 0848    SLP SHORT TERM GOAL #1   Title pt will produce loud /a/ at average 85dB over 3 therapy sessions   Time 2   Period Weeks   Status Achieved   SLP SHORT TERM GOAL #2   Title pt will increase conversational loudness to average 70dB in 5 minutes simple conversation  Status Achieved   SLP SHORT TERM GOAL #3   Title pt will improve loudness in 19/20 sentences to average 70dB   Time 3   Period Weeks   Status Achieved          SLP Long Term Goals - 2014-07-15 0934    SLP LONG TERM GOAL #1   Title pt will improve loudness in mod complex conversation of 5 minutes average 70dB, over 3 sessions   Status Achieved   SLP LONG TERM GOAL #2   Title pt will improve loudenss in 7 minutes mod complex-complex conversation over 2 sessions, average 70dB, in mod noisy environment   Status Achieved  revised 07/15/14   SLP LONG TERM GOAL #3   Title pt will improve breath support to adequate, in 7 minutes mod complex/complex conversation   Time 6   Period Weeks   Status Achieved          Plan - 07-15-2014 0933    Clinical Impression Statement Pt met all LTGs today.  Will be discharged from [facility].   Treatment/Interventions Environmental controls;Compensatory techniques;Internal/external aids;SLP instruction and feedback;Functional tasks;Patient/family education   Potential to Achieve Goals Good          G-Codes - 07/15/2014 0935    Functional Assessment Tool Used noms   Functional Limitations Motor speech   Motor Speech Goal Status [phone]) At least 1 percent but less than 20 percent impaired, limited or restricted   Motor Speech Goal Status ([id number]) At least 1 percent but less than 20 percent impaired, limited or restricted     [place]  Visits from Start of Care: 5  Current functional level related to goals / functional outcomes: Pt met all LTGs, modified to meet today, as pt was clearly loud enough in complex conversation for this SLP to be comfortable with d/c, with assurance pt will cont with loud /a/ after d/c, which she reiterated to SLP she would be. See above therapy note for current status with speech skills.   Remaining deficits: None, at this time   Education / Equipment: Compensations for softer speech  Plan: Patient agrees to discharge.  Patient goals were met. Patient is being discharged due to meeting the stated rehab goals.  ?????      Problem List Patient Active Problem List   Diagnosis Date Noted  . Shoulder pain 04/17/2014  . PD (Parkinson's disease) 06/05/2013  . Tremor 05/18/2013  . Irritation of ear 05/18/2013  . Medicare annual wellness visit, initial 05/02/2012  . Neck pain 05/02/2012  . Osteoporosis 03/08/2011  . Neck mass 03/08/2011  . Breast cancer 11/19/2010  . DYSPEPSIA&OTHER SPEC DISORDERS FUNCTION STOMACH 08/01/2008  . DYSPHAGIA UNSPECIFIED 08/01/2008  . ORTHOSTATIC HYPOTENSION 06/28/2008  . Goiter 2007/07/15  . HLD (hyperlipidemia) July 15, 2007  . MONOCLONAL GAMMOPATHY 07/15/2007  . ANEMIA-NOS July 15, 2007  . FATIGUE July 15, 2007    Shigeru Lampert , MS, [place]  15-Jul-2014, 9:36  AM  [facility] [address] [place], [name], [address] Phone: [phone]   Fax:  [phone]

## 2014-07-10 NOTE — Patient Instructions (Signed)
Coordination Exercises  Perform the following exercises for 20 minutes 3x week. Perform with both hand(s). Perform using big movements.   Flipping Cards: Place deck of cards on the table. Flip cards over by opening your hand big to grasp and then turn your palm up big.  Deal cards: Hold 1/2 or whole deck in your hand. Use thumb to push card off top of deck with one big push.  Rotate ball with fingertips: Pick up with fingers/thumb and move as much as you can with each turn/movement (clockwise and counter-clockwise).  Toss ball from one hand to the other: Toss big/high.  Toss ball in the air and catch with the same hand: Toss big/high.  Pick up coins and place in coin bank or container: Pick up with big, intentional movements. Do not drag coin to the edge.  Pick up coins and stack one at a time: Pick up with big, intentional movements. Do not drag coin to the edge. (5-10 in a stack)

## 2014-07-11 ENCOUNTER — Ambulatory Visit: Payer: Medicare Other | Admitting: Occupational Therapy

## 2014-07-11 ENCOUNTER — Ambulatory Visit: Payer: Medicare Other

## 2014-07-11 DIAGNOSIS — R29898 Other symptoms and signs involving the musculoskeletal system: Secondary | ICD-10-CM | POA: Diagnosis not present

## 2014-07-11 DIAGNOSIS — R258 Other abnormal involuntary movements: Secondary | ICD-10-CM

## 2014-07-11 DIAGNOSIS — M25611 Stiffness of right shoulder, not elsewhere classified: Secondary | ICD-10-CM | POA: Diagnosis not present

## 2014-07-11 DIAGNOSIS — R279 Unspecified lack of coordination: Secondary | ICD-10-CM

## 2014-07-11 DIAGNOSIS — M25511 Pain in right shoulder: Secondary | ICD-10-CM

## 2014-07-11 DIAGNOSIS — R490 Dysphonia: Secondary | ICD-10-CM | POA: Diagnosis not present

## 2014-07-11 NOTE — Therapy (Signed)
[facility] [address] [place], [name], [address] Phone: [phone]   Fax:  [phone]  Occupational Therapy Treatment  Patient Details  Name: [name] MRN: [id number] Date of Birth: 08/08/1940 Referring Provider:  Tonia Ghent, MD  Encounter Date: 07/11/2014      OT End of Session - 07/11/14 1011    Visit Number 5   Number of Visits 13   Date for OT Re-Evaluation 07/29/14   Authorization Type Medicare/BCBS, G-code needed, FOTO   Authorization Time Period cert period 9/76/73-05/14/35, week 2/6   Authorization - Visit Number 4   OT Start Time [phone]   OT Stop Time 1015   OT Time Calculation (min) 38 min   Activity Tolerance Patient tolerated treatment well   Behavior During Therapy [facility] for tasks assessed/performed      Past Medical History  Diagnosis Date  . Esophageal Stricture   . Monoclonal gammopathy   . Anemia     Anemia-NOS / PMH, Dr Julien Nordmann  . Hyperlipidemia   . Vertical diplopia   . MVP (mitral valve prolapse)   . Cancer     stage I right breast  . Skin cancer     basal cell L neck  . Allergy     SEASONAL  . Arthritis   . Cataract     BILATERAL  . Osteoporosis     Past Surgical History  Procedure Laterality Date  . Total abdominal hysterectomy w/ bilateral salpingoophorectomy  74 yrs old    for pain,prolapse ; G3 P2  . Salpingoophorectomy  2002    for benign growths  . Hemorrhoid surgery  1970's  . Appendectomy  2002  . Cardiac catheterization  2005    neg  . Colonoscopy      negative   . Bone biopsy  11/06/10  . Breast lumpectomy  02/2010    right breast lumpectomy and sentinel node biopsy  . Upper gastrointestinal endoscopy      There were no vitals filed for this visit.  Visit Diagnosis:  Weakness of right arm  Bradykinesia  Stiffness of joint, shoulder region, right  Lack of coordination  Right shoulder pain      Subjective Assessment - 07/11/14 0941     Pertinent History PD dx 2015, hx of breast CA with surgery 2012 (R side)   Patient Stated Goals improve ADLs   Currently in Pain? Yes   Pain Score 3    Pain Location Arm   Pain Orientation Right   Pain Descriptors / Indicators Aching   Pain Type Chronic pain   Pain Onset More than a month ago   Pain Frequency Intermittent   Aggravating Factors  activity   Pain Relieving Factors rest   Effect of Pain on Daily Activities Pt reports she forgot to take medicine last night   Multiple Pain Sites No                      OT Treatments/Exercises (OP) - 07/11/14 0001    Cognitive Exercises   Other Cognitive Exercises 1 ambulating while tossing a scarf between hands and overhead, while perfoming category generation, min v.c. for dual tasking.   Functional Reaching Activities   Mid Level Using bilateral UE's to toss scarves to targets and to place graded clothespins on antennae, min v.c for large amplitude movements. Pt demonstrated several LOB from which she was able to recover. Therapist discussed postioning to minimize fall risk/ LOB.  PWR [facility]) - 07/11/14 1009    PWR! exercises Moves in standing   PWR! Up 10   PWR! Rock 10   PWR! Twist 10   PWR Step 10   Comments min v.c. for positioning      Pt is going on vacation after next week. Pt to decide how she is feeling about her exercises and therapy and decide whether to wrap up next week or to resume therapy in July after her trip. Pt has been discharged from [facility].       OT Education - 07/10/14 0815    Education provided Yes   Education Details PWR! supine, and standing, coordination HEP   Person(s) Educated Patient   Methods Explanation;Demonstration;Verbal cues;Handout   Comprehension Verbalized understanding;Returned demonstration;Verbal cues required          OT Short Term Goals - 06/17/14 1542    OT SHORT TERM GOAL #1   Title Pt will be independent with PD-specific HEP.--check STGs 07/17/14    Time 4   Period Weeks   Status New   OT SHORT TERM GOAL #2   Title Pt will demo overhead reaching with full elbow extension without cues in 10/10 trials with each UE.   Time 4   Period Weeks   Status New   OT SHORT TERM GOAL #3   Title Pt will verbalize understanding of ways to prevent future complications related to PD.   Time 4   Period Weeks   Status New           OT Long Term Goals - 06/17/14 1543    OT LONG TERM GOAL #1   Title Pt will verbalize understanding of strategies to increase ease, safety, and independence with ADLs/IADLs prn.--check LTGs 08/01/14   Time 8   Period Weeks   Status New   OT LONG TERM GOAL #2   Title Pt will improve LUE functional reaching/coordination as shown by improving time on box and blocks test by at least 6.   Baseline R-56 blocks, L-49 blocks   Time 8   Period Weeks   Status New   OT LONG TERM GOAL #3   Title Pt will improve balance for ADLs/IADLs as shown by improving standing functional reach test by 1 inch bilaterally.   Baseline 9.5 bilaterally   Time 8   Period Weeks   Status New   OT LONG TERM GOAL #4   Title Pt will verbalize understanding of appropriate community resources prn.   Time 8   Period Weeks   Status New           Long Term Clinic Goals - 05/30/14 1347    CC Long Term Goal  #3   Title Right shoulder active abduction to at least 140 degrees for improved ADLs.  Rt shoulder abduction to 177 degrees (WNLs)   Status Achieved   CC Long Term Goal  #4   Title Pt. will report decrease in right shoulder pain with movement of at least 60%.  95-98% improved   Status Achieved            Plan - 07/11/14 0942    Clinical Impression Statement Pt is progressing towards goals and she demonstrates  with good understanding  of HEP.   Pt will benefit from skilled therapeutic intervention in order to improve on the following deficits (Retired) Decreased balance;Impaired perceived functional ability;Impaired UE  functional use;Decreased activity tolerance;Decreased range of motion;Decreased strength;Impaired tone;Decreased coordination;Decreased knowledge of use of DME  Rehab Potential Good   OT Frequency 2x / week   OT Duration 6 weeks   OT Treatment/Interventions Self-care/ADL training;Cryotherapy;Ultrasound;DME and/or AE instruction;Manual Therapy;Passive range of motion;Therapeutic activities;Balance training;Patient/family education;Therapeutic exercises;Functional Mobility Training;Fluidtherapy;Moist Heat;Neuromuscular education;Therapeutic exercise   Plan education how to avoid  PD  complications, large amplitude movements with functional activity Pt is going on vacation after next week. Pt to decide how she is feeling about her exercises/ therapy and decide whether to wrap up next week or to resume therapy in July after her trip. Pt has been discharged from [facility] issued : PWR! moves in seated, and quadraped 07/03/14, big movements with ADLs 07/04/14, PWR! standing and supine, coordination 07/10/14   Consulted and Agree with Plan of Care Patient        Problem List Patient Active Problem List   Diagnosis Date Noted  . Shoulder pain 04/17/2014  . PD (Parkinson's disease) 06/05/2013  . Tremor 05/18/2013  . Irritation of ear 05/18/2013  . Medicare annual wellness visit, initial 05/02/2012  . Neck pain 05/02/2012  . Osteoporosis 03/08/2011  . Neck mass 03/08/2011  . Breast cancer 11/19/2010  . DYSPEPSIA&OTHER SPEC DISORDERS FUNCTION STOMACH 08/01/2008  . DYSPHAGIA UNSPECIFIED 08/01/2008  . ORTHOSTATIC HYPOTENSION 06/28/2008  . Goiter 07/10/2007  . HLD (hyperlipidemia) 07/10/2007  . MONOCLONAL GAMMOPATHY 07/10/2007  . ANEMIA-NOS 07/10/2007  . FATIGUE 07/10/2007    RINE,KATHRYN 07/11/2014, 4:11 PM Theone Murdoch, OTR/L [phone] Phone: [phone] 4:11 PM 07/11/2014   [facility] [address] [place], [name], [address] Phone: [phone]   Fax:  [phone]

## 2014-07-12 ENCOUNTER — Other Ambulatory Visit (INDEPENDENT_AMBULATORY_CARE_PROVIDER_SITE_OTHER): Payer: Medicare Other

## 2014-07-12 DIAGNOSIS — E049 Nontoxic goiter, unspecified: Secondary | ICD-10-CM | POA: Diagnosis not present

## 2014-07-12 DIAGNOSIS — E785 Hyperlipidemia, unspecified: Secondary | ICD-10-CM

## 2014-07-12 DIAGNOSIS — M810 Age-related osteoporosis without current pathological fracture: Secondary | ICD-10-CM | POA: Diagnosis not present

## 2014-07-12 LAB — LIPID PANEL
Cholesterol: 160 mg/dL (ref 0–200)
HDL: 69 mg/dL (ref >39.00)
LDL Cholesterol: 76 mg/dL (ref 0–99)
NonHDL: 91
Total CHOL/HDL Ratio: 2
Triglycerides: 75 mg/dL (ref 0.0–149.0)
VLDL: 15 mg/dL (ref 0.0–40.0)

## 2014-07-12 LAB — VITAMIN D 25 HYDROXY (VIT D DEFICIENCY, FRACTURES): VITD: 31.26 ng/mL (ref 30.00–100.00)

## 2014-07-12 LAB — TSH: TSH: 4.51 u[IU]/mL — ABNORMAL HIGH (ref 0.35–4.50)

## 2014-07-16 ENCOUNTER — Ambulatory Visit: Payer: Medicare Other

## 2014-07-16 ENCOUNTER — Ambulatory Visit: Payer: Medicare Other | Admitting: Occupational Therapy

## 2014-07-16 DIAGNOSIS — M25611 Stiffness of right shoulder, not elsewhere classified: Secondary | ICD-10-CM | POA: Diagnosis not present

## 2014-07-16 DIAGNOSIS — R258 Other abnormal involuntary movements: Secondary | ICD-10-CM | POA: Diagnosis not present

## 2014-07-16 DIAGNOSIS — R29898 Other symptoms and signs involving the musculoskeletal system: Secondary | ICD-10-CM | POA: Diagnosis not present

## 2014-07-16 DIAGNOSIS — R279 Unspecified lack of coordination: Secondary | ICD-10-CM

## 2014-07-16 DIAGNOSIS — M25511 Pain in right shoulder: Secondary | ICD-10-CM | POA: Diagnosis not present

## 2014-07-16 DIAGNOSIS — R490 Dysphonia: Secondary | ICD-10-CM | POA: Diagnosis not present

## 2014-07-16 NOTE — Therapy (Addendum)
[facility] [address] [place], [name], [address] Phone: [phone]   Fax:  [phone]  Occupational Therapy Treatment  Patient Details  Name: [name] MRN: [id number] Date of Birth: 08-Oct-1940 Referring Provider:  Tonia Ghent, MD  Encounter Date: 07/16/2014      OT End of Session - 07/16/14 1629    Visit Number 6   Number of Visits 13   Date for OT Re-Evaluation 07/29/14   Authorization Type Medicare/BCBS, G-code needed, FOTO   Authorization Time Period cert period 6/76/19-06/03/30, week 3/6   Authorization - Visit Number 6   Authorization - Number of Visits 10   OT Start Time 1150   OT Stop Time 1236   OT Time Calculation (min) 46 min   Activity Tolerance Patient tolerated treatment well   Behavior During Therapy [facility] for tasks assessed/performed      Past Medical History  Diagnosis Date  . Esophageal Stricture   . Monoclonal gammopathy   . Anemia     Anemia-NOS / PMH, Dr Julien Nordmann  . Hyperlipidemia   . Vertical diplopia   . MVP (mitral valve prolapse)   . Cancer     stage I right breast  . Skin cancer     basal cell L neck  . Allergy     SEASONAL  . Arthritis   . Cataract     BILATERAL  . Osteoporosis     Past Surgical History  Procedure Laterality Date  . Total abdominal hysterectomy w/ bilateral salpingoophorectomy  74 yrs old    for pain,prolapse ; G3 P2  . Salpingoophorectomy  2002    for benign growths  . Hemorrhoid surgery  1970's  . Appendectomy  2002  . Cardiac catheterization  2005    neg  . Colonoscopy      negative   . Bone biopsy  11/06/10  . Breast lumpectomy  02/2010    right breast lumpectomy and sentinel node biopsy  . Upper gastrointestinal endoscopy      There were no vitals filed for this visit.  Visit Diagnosis:  Bradykinesia  Lack of coordination      Subjective Assessment - 07/16/14 1150    Subjective  exercises going well   Pertinent  History PD dx 2015, hx of breast CA with surgery 2012 (R side)   Patient Stated Goals improve ADLs   Currently in Pain? Yes   Pain Score 2    Pain Location Shoulder   Pain Orientation Right   Pain Descriptors / Indicators Aching   Aggravating Factors  activity   Pain Relieving Factors rest                      OT Treatments/Exercises (OP) - 07/16/14 0001    ADLs   Overall ADLs Pt instructed in appropriate community resources.   ADL Comments Pt instructed in ways to incorporate cognitive activities to challenge thinking skills.           PWR [facility]) - 07/16/14 1618    PWR! exercises Moves in sitting;Moves in [place];Moves in supine;Moves in prone;Moves in standing   PWR! Up 10   PWR! Rock 10   PWR! Twist 10   PWR! Step 10   Comments quadraped, min v.c. for PWR! hands   PWR! Up 10   PWR! Rock 10   PWR! Twist 10   PWR! Step 10   Comments supine, min v.c. for PWR! hands   PWR!  Up 10   PWR! Rock 10   PWR! Twist 10   PWR! Step 10   Comments prone, min v.c.   PWR! Up 10   PWR! Rock 10   PWR! Twist 10   PWR Step 10   Comments standing, min v.c. for PWR! hands, posture   PWR! Up 10   PWR! Rock 10   PWR! Twist 10   PWR! Step 10   Basic 4 Flow Pt instructed in how to perfom flow for incr difficulty (for all positions)   Comments min v.c. for PWR! Hands in sitting             OT Education - 07/16/14 1315    Education Details ways to prevenet future complications, ways to advance HEP, PWR! exercises in [place], ways to incoporate cognitive activiites into daily activties/exercise, community resources ([employer] over [employer], [place]! exercises/circuit classes, webinars)   Person(s) Educated Patient   Methods Explanation;Demonstration;Verbal cues;Handout   Comprehension Verbalized understanding;Returned demonstration          OT Short Term Goals - 07/16/14 1632    OT SHORT TERM GOAL #1   Title Pt will be independent with PD-specific HEP.--check STGs  07/17/14   Time 4   Period Weeks   Status New   OT SHORT TERM GOAL #2   Title Pt will demo overhead reaching with full elbow extension without cues in 10/10 trials with each UE.   Time 4   Period Weeks   Status New   OT SHORT TERM GOAL #3   Title Pt will verbalize understanding of ways to prevent future complications related to PD.   Time 4   Period Weeks   Status Achieved  07/16/14           OT Long Term Goals - 07/16/14 1633    OT LONG TERM GOAL #1   Title Pt will verbalize understanding of strategies to increase ease, safety, and independence with ADLs/IADLs prn.--check LTGs 08/01/14   Time 8   Period Weeks   Status New   OT LONG TERM GOAL #2   Title Pt will improve LUE functional reaching/coordination as shown by improving time on box and blocks test by at least 6.   Baseline R-56 blocks, L-49 blocks   Time 8   Period Weeks   Status New   OT LONG TERM GOAL #3   Title Pt will improve balance for ADLs/IADLs as shown by improving standing functional reach test by 1 inch bilaterally.   Baseline 9.5 bilaterally   Time 8   Period Weeks   Status New   OT LONG TERM GOAL #4   Title Pt will verbalize understanding of appropriate community resources prn.   Time 8   Period Weeks   Status Achieved  07/16/14               Plan - 07/16/14 1630    Clinical Impression Statement Pt progressing towards goals and verbalizes understanding of education provided.   Plan check goals, continue vs. d/c, review pt eduation   OT Home Exercise Plan issued : PWR! moves in seated, and quadraped 07/03/14, big movements with ADLs 07/04/14, PWR! standing and supine, coordination 07/10/14, 07/16/14:  prone PWR!  (has performed previously)   Consulted and Agree with Plan of Care Patient        Problem List Patient Active Problem List   Diagnosis Date Noted  . Shoulder pain 04/17/2014  . PD (Parkinson's disease) 06/05/2013  . Tremor 05/18/2013  .  Irritation of ear 05/18/2013  . Medicare  annual wellness visit, initial 05/02/2012  . Neck pain 05/02/2012  . Osteoporosis 03/08/2011  . Neck mass 03/08/2011  . Breast cancer 11/19/2010  . DYSPEPSIA&OTHER SPEC DISORDERS FUNCTION STOMACH 08/01/2008  . DYSPHAGIA UNSPECIFIED 08/01/2008  . ORTHOSTATIC HYPOTENSION 06/28/2008  . Goiter 07/10/2007  . HLD (hyperlipidemia) 07/10/2007  . MONOCLONAL GAMMOPATHY 07/10/2007  . ANEMIA-NOS 07/10/2007  . FATIGUE 07/10/2007    [facility] 07/16/2014, 4:38 PM  [facility] [address] [place], [name], [address] Phone: [phone]   Fax:  [place], OTR/L 07/16/2014 4:38 PM

## 2014-07-18 ENCOUNTER — Encounter: Payer: Self-pay | Admitting: Occupational Therapy

## 2014-07-18 ENCOUNTER — Ambulatory Visit: Payer: Medicare Other | Admitting: Occupational Therapy

## 2014-07-18 ENCOUNTER — Encounter: Payer: Medicare Other | Admitting: Speech Pathology

## 2014-07-18 ENCOUNTER — Ambulatory Visit (INDEPENDENT_AMBULATORY_CARE_PROVIDER_SITE_OTHER): Payer: Medicare Other | Admitting: Family Medicine

## 2014-07-18 ENCOUNTER — Encounter: Payer: Self-pay | Admitting: Family Medicine

## 2014-07-18 VITALS — BP 110/70 | HR 78 | Temp 98.9°F | Ht 65.0 in | Wt 125.5 lb

## 2014-07-18 DIAGNOSIS — E049 Nontoxic goiter, unspecified: Secondary | ICD-10-CM | POA: Diagnosis not present

## 2014-07-18 DIAGNOSIS — M25511 Pain in right shoulder: Secondary | ICD-10-CM | POA: Diagnosis not present

## 2014-07-18 DIAGNOSIS — Z0000 Encounter for general adult medical examination without abnormal findings: Secondary | ICD-10-CM | POA: Diagnosis not present

## 2014-07-18 DIAGNOSIS — R258 Other abnormal involuntary movements: Secondary | ICD-10-CM | POA: Diagnosis not present

## 2014-07-18 DIAGNOSIS — R490 Dysphonia: Secondary | ICD-10-CM | POA: Diagnosis not present

## 2014-07-18 DIAGNOSIS — M810 Age-related osteoporosis without current pathological fracture: Secondary | ICD-10-CM

## 2014-07-18 DIAGNOSIS — M791 Myalgia, unspecified site: Secondary | ICD-10-CM

## 2014-07-18 DIAGNOSIS — R29898 Other symptoms and signs involving the musculoskeletal system: Secondary | ICD-10-CM | POA: Diagnosis not present

## 2014-07-18 DIAGNOSIS — Z23 Encounter for immunization: Secondary | ICD-10-CM | POA: Diagnosis not present

## 2014-07-18 DIAGNOSIS — M25611 Stiffness of right shoulder, not elsewhere classified: Secondary | ICD-10-CM | POA: Diagnosis not present

## 2014-07-18 DIAGNOSIS — R279 Unspecified lack of coordination: Secondary | ICD-10-CM | POA: Diagnosis not present

## 2014-07-18 NOTE — Patient Instructions (Signed)
Try heat and/or ice on the sore spot and see if that helps.  Don't change your meds for now.  Take care.  Glad to see you.

## 2014-07-18 NOTE — Therapy (Signed)
[facility] [address] [place], [name], [address] Phone: [phone]   Fax:  [phone]  Occupational Therapy Treatment  Patient Details  Name: [name] MRN: [id number] Date of Birth: 1940-09-02 Referring Provider:  Tonia Ghent, MD  Encounter Date: 07/18/2014      OT End of Session - 07/18/14 0851    Visit Number 7   Number of Visits 13   Date for OT Re-Evaluation 07/29/14   Authorization Type Medicare/BCBS, G-code needed, FOTO   Authorization Time Period cert period 0/05/11-0/21/11, week 3/6   Authorization - Visit Number 7   Authorization - Number of Visits 10   OT Start Time [phone]   OT Stop Time 0930   OT Time Calculation (min) 44 min   Activity Tolerance Patient tolerated treatment well   Behavior During Therapy [facility] for tasks assessed/performed      Past Medical History  Diagnosis Date  . Esophageal Stricture   . Monoclonal gammopathy   . Anemia     Anemia-NOS / PMH, Dr Julien Nordmann  . Hyperlipidemia   . Vertical diplopia   . MVP (mitral valve prolapse)   . Cancer     stage I right breast  . Skin cancer     basal cell L neck  . Allergy     SEASONAL  . Arthritis   . Cataract     BILATERAL  . Osteoporosis     Past Surgical History  Procedure Laterality Date  . Total abdominal hysterectomy w/ bilateral salpingoophorectomy  74 yrs old    for pain,prolapse ; G3 P2  . Salpingoophorectomy  2002    for benign growths  . Hemorrhoid surgery  1970's  . Appendectomy  2002  . Cardiac catheterization  2005    neg  . Colonoscopy      negative   . Bone biopsy  11/06/10  . Breast lumpectomy  02/2010    right breast lumpectomy and sentinel node biopsy  . Upper gastrointestinal endoscopy      There were no vitals filed for this visit.  Visit Diagnosis:  Bradykinesia  Lack of coordination      Subjective Assessment - 07/18/14 1005    Subjective  Has physcial MD appt today   Pertinent History PD dx 2015, hx of breast CA with surgery 2012 (R side)   Patient Stated Goals improve ADLs   Currently in Pain? No/denies                      OT Treatments/Exercises (OP) - 07/18/14 0001    ADLs   ADL Comments Began discussing progress.  Pt agrees to continue OT for continued education.   Neurological Re-education Exercises   Reciprocal Movements Arm bike x45min level 1 with min cues to maintain >40rpms, pt maintained 43-44rpms   Functional Reaching Activities   High Level with each UE to remove/replace cylinder objects with focus/min instruction initially for big movements.                OT Education - 07/18/14 1004    Education Details reviewed ways to prevent future complications, reviewed coordination HEP and updated (pt returned demo each)   Person(s) Educated Patient   Methods Explanation;Demonstration;Verbal cues;Handout   Comprehension Verbalized understanding;Returned demonstration          OT Short Term Goals - 07/18/14 0920    OT SHORT TERM GOAL #1   Title Pt will be independent with PD-specific HEP.--check  STGs 07/17/14   Time 4   Period Weeks   Status On-going   OT SHORT TERM GOAL #2   Title Pt will demo overhead reaching with full elbow extension without cues in 10/10 trials with each UE.   Time 4   Period Weeks   Status Achieved  07/18/14   OT SHORT TERM GOAL #3   Title Pt will verbalize understanding of ways to prevent future complications related to PD.   Time 4   Period Weeks   Status Achieved  07/16/14           OT Long Term Goals - 07/18/14 0923    OT LONG TERM GOAL #1   Title Pt will verbalize understanding of strategies to increase ease, safety, and independence with ADLs/IADLs prn.--check LTGs 08/01/14   Time 8   Period Weeks   Status Achieved  07/18/14   OT LONG TERM GOAL #2   Title Pt will improve LUE functional reaching/coordination as shown by improving time on box and blocks test by at least 6.    Baseline R-56 blocks, L-49 blocks   Time 8   Period Weeks   Status New   OT LONG TERM GOAL #3   Title Pt will improve balance for ADLs/IADLs as shown by improving standing functional reach test by 1 inch bilaterally.   Baseline 9.5 bilaterally   Time 8   Period Weeks   Status New   OT LONG TERM GOAL #4   Title Pt will verbalize understanding of appropriate community resources prn.   Time 8   Period Weeks   Status Achieved  07/16/14               Plan - 07/18/14 0903    Clinical Impression Statement Pt continues to progress towards goals, but would benefit from continued education on ways to progress HEP/incr difficulty.   Plan Pt agrees to continue after she returns to town (on vacation next week), continue with instruction on how to progress HEP    OT Home Exercise Plan issued : PWR! moves in seated, and quadraped 07/03/14, big movements with ADLs 07/04/14, PWR! standing and supine, coordination 07/10/14, 07/16/14:  prone PWR!  (has performed previously), 6/23/16updated coordination HEP   Consulted and Agree with Plan of Care Patient        Problem List Patient Active Problem List   Diagnosis Date Noted  . Shoulder pain 04/17/2014  . PD (Parkinson's disease) 06/05/2013  . Tremor 05/18/2013  . Irritation of ear 05/18/2013  . Medicare annual wellness visit, initial 05/02/2012  . Neck pain 05/02/2012  . Osteoporosis 03/08/2011  . Neck mass 03/08/2011  . Breast cancer 11/19/2010  . DYSPEPSIA&OTHER SPEC DISORDERS FUNCTION STOMACH 08/01/2008  . DYSPHAGIA UNSPECIFIED 08/01/2008  . ORTHOSTATIC HYPOTENSION 06/28/2008  . Goiter 07/10/2007  . HLD (hyperlipidemia) 07/10/2007  . MONOCLONAL GAMMOPATHY 07/10/2007  . ANEMIA-NOS 07/10/2007  . FATIGUE 07/10/2007    [facility] 07/18/2014, 10:11 AM  [place] [address] [place], [name], [address] Phone: [phone]   Fax:  [place],  OTR/L 07/18/2014 10:12 AM

## 2014-07-18 NOTE — Patient Instructions (Addendum)
Coordination Exercises  Perform the following exercises for 20 minutes 3x week. Perform with both hand(s). Perform using big movements.  1. Flipping Cards: Place deck of cards on the table. Flip cards over by opening your hand big to grasp and then turn your palm up big. 2. Deal cards: Hold 1/2 or whole deck in your hand. Use thumb to push card off top of deck with one big push. 3. Rotate ball with fingertips: Pick up with fingers/thumb and move as much as you can with each turn/movement (clockwise and counter-clockwise). 4. Toss ball from one hand to the other: Toss big/high. 5. Toss ball in the air and catch with the same hand: Toss big/high. 6. Pick up coins and place in coin bank or container: Pick up with big, intentional movements. Do not drag coin to the edge. 7. Pick up coins and stack one at a time: Pick up with big, intentional movements. Do not drag coin to the edge. (5-10 in a stack)   Flip card between each finger.  Juggle 2 balls: Do not go fast. Pause after each toss.  Rotate 2 golf balls in your hand: Both directions.  Perform "Flicks"/hand stretches (PWR! Hands): Close hands then flick out your fingers with focus on opening hands, pulling wrists back, and extending elbows like you are pushing.  Fasten nuts/bolts or put on bottle caps: Turn as much/as big as you can with each turn.

## 2014-07-18 NOTE — Progress Notes (Signed)
Pre visit review using our clinic review tool, if applicable. No additional management support is needed unless otherwise documented below in the visit note.  I have personally reviewed the Medicare Annual Wellness questionnaire and have noted 1. The patient's medical and social history 2. Their use of alcohol, tobacco or illicit drugs 3. Their current medications and supplements 4. The patient's functional ability including ADL's, fall risks, home safety risks and hearing or visual             impairment. 5. Diet and physical activities 6. Evidence for depression or mood disorders  The patients weight, height, BMI have been recorded in the chart and visual acuity is per eye clinic.  I have made referrals, counseling and provided education to the patient based review of the above and I have provided the pt with a written personalized care plan for preventive services.  Provider list updated- see scanned forms.  Routine anticipatory guidance given to patient.  See health maintenance.  Flu 2015 Shingles 2013 PNA 2013 Tetanus 2011 Colonoscopy 2011 Breast cancer screening 2015 DXA done 2014 Cognitive function addressed- see scanned forms- and if abnormal then additional documentation follows.  She has prolia through oncology.  I'll check with Dr. Julien Nordmann about this.    H/o goiter. Mildly abnormal TSH, reasonable to recheck next year.  No new neck mass or lumps.  No dysphagia.    She is working through the loss of her husband earlier this year.  She is getting by and have support from her family.   She has some intermittent pain at the L iliac crest, worse with certain movements.  Variable sx, it seems like the muscles get sore locally.  No trauma.  No fevers.  No rash.    PMH and SH reviewed  Meds, vitals, and allergies reviewed.   ROS: See HPI.  Otherwise negative.    GEN: nad, alert and oriented HEENT: mucous membranes moist NECK: supple w/o LA, slightly thyroid enlargement but  not ttp CV: rrr. PULM: ctab, no inc wob ABD: soft, +bs EXT: no edema SKIN: no acute rash L iliac area slightly tender but not bruised, no rash.   Midline back not ttp.

## 2014-07-20 DIAGNOSIS — M791 Myalgia, unspecified site: Secondary | ICD-10-CM | POA: Insufficient documentation

## 2014-07-20 NOTE — Assessment & Plan Note (Signed)
Flu 2015 Shingles 2013 PNA 2013 Tetanus 2011 Colonoscopy 2011 Breast cancer screening 2015 DXA done 2014 Cognitive function addressed- see scanned forms- and if abnormal then additional documentation follows.  She has prolia through oncology.  I'll check with Dr. Julien Nordmann about this.

## 2014-07-20 NOTE — Assessment & Plan Note (Signed)
Mildly abnormal TSH, reasonable to recheck next year.  No dysphagia.

## 2014-07-20 NOTE — Assessment & Plan Note (Signed)
Is intermittent, not constant, worse with certain positions.  Likely muscle strain.  See AVS.

## 2014-07-22 ENCOUNTER — Encounter: Payer: Medicare Other | Admitting: Occupational Therapy

## 2014-07-24 ENCOUNTER — Encounter: Payer: Medicare Other | Admitting: Occupational Therapy

## 2014-07-30 ENCOUNTER — Ambulatory Visit: Payer: Medicare Other | Attending: General Surgery | Admitting: Occupational Therapy

## 2014-07-30 ENCOUNTER — Encounter: Payer: Medicare Other | Admitting: Speech Pathology

## 2014-07-30 ENCOUNTER — Encounter: Payer: Self-pay | Admitting: Occupational Therapy

## 2014-07-30 DIAGNOSIS — M25511 Pain in right shoulder: Secondary | ICD-10-CM | POA: Diagnosis not present

## 2014-07-30 DIAGNOSIS — R279 Unspecified lack of coordination: Secondary | ICD-10-CM | POA: Diagnosis not present

## 2014-07-30 DIAGNOSIS — M25611 Stiffness of right shoulder, not elsewhere classified: Secondary | ICD-10-CM | POA: Insufficient documentation

## 2014-07-30 DIAGNOSIS — R293 Abnormal posture: Secondary | ICD-10-CM | POA: Insufficient documentation

## 2014-07-30 DIAGNOSIS — R258 Other abnormal involuntary movements: Secondary | ICD-10-CM | POA: Diagnosis not present

## 2014-07-30 NOTE — Patient Instructions (Signed)
Coordination Exercises  Perform the following exercises for 20 minutes 3x week. Perform with both hand(s). Perform using big movements.  1. Flipping Cards: Place deck of cards on the table. Flip cards over by opening your hand big to grasp and then turn your palm up big. 2. Deal cards: Hold 1/2 or whole deck in your hand. Use thumb to push card off top of deck with one big push. 3. Rotate ball with fingertips: Pick up with fingers/thumb and move as much as you can with each turn/movement (clockwise and counter-clockwise). 4. Toss ball from one hand to the other: Toss big/high. 5. Toss ball in the air and catch with the same hand: Toss big/high. 6. Pick up coins and place in coin bank or container: Pick up with big, intentional movements. Do not drag coin to the edge. 7. Pick up coins and stack one at a time: Pick up with big, intentional movements. Do not drag coin to the edge. (5-10 in a stack)  1. Flip card between each finger. 2. Juggle 2 balls: Do not go fast. Pause after each toss. 3. Rotate 2 golf balls in your hand: Both directions. 4. Fasten nuts/bolts or put on bottle caps: Turn as much/as big as you can with each turn. 5. Turn dice in fingertips to turn dice to designated number 6. Hold small trash bag or produce bag by the end.  Stretch out your fingers to pull bag into palm using big movements.  PWR! Hands  With arms stretched out in front of you (elbows straight), perform the following:  PWR! Rock: Move wrists up and down [employer]! Twist: Twist palms up and down BIG  Then, start with elbows bent and hands closed.  PWR! Step: Touch index finger to thumb while keeping other fingers straight. Flick fingers out BIG (thumb out/straighten fingers). Repeat with other fingers. (Step your thumb to each finger).  PWR! Hands: Push hands out BIG. Elbows straight, wrists up, fingers open and spread apart BIG. (Can also perform by pushing down on table, chair, knees. Push above  head, out to the side, behind you, in front of you.)   ** Make each movement big and deliberate so that you feel the movement.  Perform at least 10 repetitions 1x/day, but perform PWR! hands throughout the day when you are having trouble using your hands (picking up/manipulating small objects, writing, eating, typing, sewing, buttoning, etc.).

## 2014-07-30 NOTE — Therapy (Signed)
[facility] [address] [place], [name], [address] Phone: [phone]   Fax:  [phone]  Occupational Therapy Treatment  Patient Details  Name: [name] MRN: [id number] Date of Birth: [birth year] Referring Provider:  Tonia Ghent, MD  Encounter Date: 07/30/2014      OT End of Session - 07/30/14 1416    Visit Number 8   Number of Visits 13   Date for OT Re-Evaluation 07/29/14   Authorization Type Medicare/BCBS, G-code needed, FOTO   Authorization Time Period cert period 8/36/62-9/47/65, week 4/6   Authorization - Visit Number 8   Authorization - Number of Visits 10   OT Start Time 1318   OT Stop Time 1403   OT Time Calculation (min) 45 min   Activity Tolerance Patient tolerated treatment well   Behavior During Therapy [facility] for tasks assessed/performed      Past Medical History  Diagnosis Date  . Esophageal Stricture   . Monoclonal gammopathy   . Anemia     Anemia-NOS / PMH, Dr Julien Nordmann  . Hyperlipidemia   . Vertical diplopia   . MVP (mitral valve prolapse)   . Cancer     stage I right breast  . Skin cancer     basal cell L neck  . Allergy     SEASONAL  . Arthritis   . Cataract     BILATERAL  . Osteoporosis     Past Surgical History  Procedure Laterality Date  . Total abdominal hysterectomy w/ bilateral salpingoophorectomy  74 yrs old    for pain,prolapse ; G3 P2  . Salpingoophorectomy  2002    for benign growths  . Hemorrhoid surgery  1970's  . Appendectomy  2002  . Cardiac catheterization  2005    neg  . Colonoscopy      negative   . Bone biopsy  11/06/10  . Breast lumpectomy  02/2010    right breast lumpectomy and sentinel node biopsy  . Upper gastrointestinal endoscopy      There were no vitals filed for this visit.  Visit Diagnosis:  Bradykinesia  Lack of coordination                            OT Education - 07/30/14 1420    Education  Details updates to coordination HEP improved and added PWR! hands and additional ex  (see pt instructions)   Person(s) Educated Patient   Methods Explanation;Demonstration;Verbal cues;Handout   Comprehension Verbalized understanding;Returned demonstration          OT Short Term Goals - 07/18/14 0920    OT SHORT TERM GOAL #1   Title Pt will be independent with PD-specific HEP.--check STGs 07/17/14   Time 4   Period Weeks   Status On-going   OT SHORT TERM GOAL #2   Title Pt will demo overhead reaching with full elbow extension without cues in 10/10 trials with each UE.   Time 4   Period Weeks   Status Achieved  07/18/14   OT SHORT TERM GOAL #3   Title Pt will verbalize understanding of ways to prevent future complications related to PD.   Time 4   Period Weeks   Status Achieved  07/16/14           OT Long Term Goals - 07/18/14 0923    OT LONG TERM GOAL #1   Title Pt will verbalize understanding of strategies to increase  ease, safety, and independence with ADLs/IADLs prn.--check LTGs 08/01/14   Time 8   Period Weeks   Status Achieved  07/18/14   OT LONG TERM GOAL #2   Title Pt will improve LUE functional reaching/coordination as shown by improving time on box and blocks test by at least 6.   Baseline R-56 blocks, L-49 blocks   Time 8   Period Weeks   Status New   OT LONG TERM GOAL #3   Title Pt will improve balance for ADLs/IADLs as shown by improving standing functional reach test by 1 inch bilaterally.   Baseline 9.5 bilaterally   Time 8   Period Weeks   Status New   OT LONG TERM GOAL #4   Title Pt will verbalize understanding of appropriate community resources prn.   Time 8   Period Weeks   Status Achieved  07/16/14               Plan - 07/30/14 1418    Clinical Impression Statement Pt continues to progress towards goals and verbalizes understanding of updates to HEP and is able to return demo.   Plan check goals, PWR! moves in flow, multi-directional  step and reach   OT Home Exercise Plan issued : PWR! moves in seated, and quadraped 07/03/14, big movements with ADLs 07/04/14, PWR! standing and supine, coordination 07/10/14, 07/16/14:  prone PWR!  (has performed previously), 6/23/16updated coordination HEP (and 07/30/14 and added PWR! hands basic 4)   Consulted and Agree with Plan of Care Patient        Problem List Patient Active Problem List   Diagnosis Date Noted  . Muscle pain 07/20/2014  . Shoulder pain 04/17/2014  . PD (Parkinson's disease) 06/05/2013  . Tremor 05/18/2013  . Irritation of ear 05/18/2013  . Medicare annual wellness visit, subsequent 05/02/2012  . Neck pain 05/02/2012  . Osteoporosis 03/08/2011  . Neck mass 03/08/2011  . Breast cancer 11/19/2010  . DYSPEPSIA&OTHER SPEC DISORDERS FUNCTION STOMACH 08/01/2008  . DYSPHAGIA UNSPECIFIED 08/01/2008  . ORTHOSTATIC HYPOTENSION 06/28/2008  . Goiter 07/10/2007  . HLD (hyperlipidemia) 07/10/2007  . MONOCLONAL GAMMOPATHY 07/10/2007  . ANEMIA-NOS 07/10/2007  . FATIGUE 07/10/2007    [facility] 07/30/2014, 2:21 PM  [facility] [address] [place], [name], [address] Phone: [phone]   Fax:  [place], OTR/L 07/30/2014 2:21 PM

## 2014-08-01 ENCOUNTER — Encounter: Payer: Medicare Other | Admitting: Speech Pathology

## 2014-08-01 ENCOUNTER — Ambulatory Visit: Payer: Medicare Other | Admitting: Occupational Therapy

## 2014-08-01 DIAGNOSIS — R258 Other abnormal involuntary movements: Secondary | ICD-10-CM | POA: Diagnosis not present

## 2014-08-01 DIAGNOSIS — R279 Unspecified lack of coordination: Secondary | ICD-10-CM | POA: Diagnosis not present

## 2014-08-01 DIAGNOSIS — R293 Abnormal posture: Secondary | ICD-10-CM

## 2014-08-01 DIAGNOSIS — M25611 Stiffness of right shoulder, not elsewhere classified: Secondary | ICD-10-CM | POA: Diagnosis not present

## 2014-08-01 DIAGNOSIS — M25511 Pain in right shoulder: Secondary | ICD-10-CM | POA: Diagnosis not present

## 2014-08-01 NOTE — Therapy (Addendum)
[facility] [address] [place], [name], [address] Phone: [phone]   Fax:  [phone]  Occupational Therapy Treatment  Patient Details  Name: [name] MRN: [id number] Date of Birth: Oct 10, 1940 Referring Provider:  Tonia Ghent, MD  Encounter Date: 08/01/2014      OT End of Session - 08/01/14 2003    Visit Number 9   Number of Visits 13   Date for OT Re-Evaluation 07/29/14   Authorization Type Medicare/BCBS, G-code needed, FOTO   Authorization Time Period cert period 7/34/28-7/68/11, week 4/6   Authorization - Visit Number 9   Authorization - Number of Visits 10   OT Start Time 1321   OT Stop Time 1402   OT Time Calculation (min) 41 min   Activity Tolerance Patient tolerated treatment well   Behavior During Therapy [facility] for tasks assessed/performed      Past Medical History  Diagnosis Date  . Esophageal Stricture   . Monoclonal gammopathy   . Anemia     Anemia-NOS / PMH, Dr Julien Nordmann  . Hyperlipidemia   . Vertical diplopia   . MVP (mitral valve prolapse)   . Cancer     stage I right breast  . Skin cancer     basal cell L neck  . Allergy     SEASONAL  . Arthritis   . Cataract     BILATERAL  . Osteoporosis     Past Surgical History  Procedure Laterality Date  . Total abdominal hysterectomy w/ bilateral salpingoophorectomy  74 yrs old    for pain,prolapse ; G3 P2  . Salpingoophorectomy  2002    for benign growths  . Hemorrhoid surgery  1970's  . Appendectomy  2002  . Cardiac catheterization  2005    neg  . Colonoscopy      negative   . Bone biopsy  11/06/10  . Breast lumpectomy  02/2010    right breast lumpectomy and sentinel node biopsy  . Upper gastrointestinal endoscopy      There were no vitals filed for this visit.  Visit Diagnosis:  Bradykinesia  Lack of coordination  Right shoulder pain  Stiffness of joint, shoulder region, right  Abnormal posture       Subjective Assessment - 08/01/14 1949    Subjective  My R shoulder doesn't even hurt anymore and it has hurt for a long time.    Pertinent History PD dx 2015, hx of breast CA with surgery 2012 (R side)   Patient Stated Goals improve ADLs   Currently in Pain? No/denies                      OT Treatments/Exercises (OP) - 08/01/14 0001    ADLs   Overall ADLs checked remaining goals and discussed process and recommendation/process for follow-up therapy screens in approx 6-60month.  Pt agrees.  Reviewed cArboriculturistand importance of HEP and ways to progress and add cognitive components.  Instructed pt in multi-directional movements (step and reach) in sequence and with cognitive components.  Pt prepared list of directions for home use with good writing legibility and large amplitude size.           PWR ([facility] - 08/01/14 1957    PWR! exercises Moves in sitting;Moves in qWorthingMoves in supine;Moves in prone;Moves in standing;Multi-directional movements with dual tasks   Basic 4 Flow x4 with min v.c.   Comments quadraped   Basic 4 Flow x4  Comments supine with min v.c.   Basic 4 flow x4    Comments prone with min v.c.   Basic 4 Flow x4 with min v.c.    Comments in standing   Basic 4 Flow x4 with min v.c.   Comments PWR! hands   Basic 4 Flow x4   Comments sitting with min v.c.             OT Education - 2014/08/31 1952    Education Details reviewed all PWR Moves in flow (prone, supine, sitting, standing, quadraped), multi-directional step/reach movements (how to progress by adding cognitive components)   Person(s) Educated Patient   Methods Explanation;Demonstration;Verbal cues;Handout   Comprehension Verbalized understanding;Returned demonstration;Verbal cues required          OT Short Term Goals - 08-31-14 1351    OT SHORT TERM GOAL #1   Title Pt will be independent with PD-specific HEP.--check STGs 07/17/14   Time 4   Period Weeks    Status Achieved   OT SHORT TERM GOAL #2   Title Pt will demo overhead reaching with full elbow extension without cues in 10/10 trials with each UE.   Time 4   Period Weeks   Status Achieved  07/18/14   OT SHORT TERM GOAL #3   Title Pt will verbalize understanding of ways to prevent future complications related to PD.   Time 4   Period Weeks   Status Achieved  07/16/14           OT Long Term Goals - 08/31/2014 1354    OT LONG TERM GOAL #1   Title Pt will verbalize understanding of strategies to increase ease, safety, and independence with ADLs/IADLs prn.--check LTGs 08-31-14   Time 8   Period Weeks   Status Achieved  07/18/14   OT LONG TERM GOAL #2   Title Pt will improve LUE functional reaching/coordination as shown by improving time on box and blocks test by at least 6.   Baseline R-56 blocks, L-49 blocks   Time 8   Period Weeks   Status Achieved  L-56 blocks, R-61 blocks   OT LONG TERM GOAL #3   Title Pt will improve balance for ADLs/IADLs as shown by improving standing functional reach test by 1 inch bilaterally.   Baseline 9.5 bilaterally   Time 8   Period Weeks   Status Achieved  R-12inches, L-11inches   OT LONG TERM GOAL #4   Title Pt will verbalize understanding of appropriate community resources prn.   Time 8   Period Weeks   Status Achieved  07/16/14              Plan - 2014/08/31 03-26-2001    Clinical Impression Statement Pt met all goals and demo/verbalizes understanding of ways to prevent future complications and progress HEP.  Pt reports no pain in RUE now.   Plan schedule therapy screens in 6-87month, d/c OT   OT Home Exercise Plan issued : PWR! moves in seated, and quadraped 07/03/14, big movements with ADLs 07/04/14, PWR! standing and supine, coordination 07/10/14, 07/16/14:  prone PWR!  (has performed previously), 6/23/16updated coordination HEP (and 07/30/14 and added PWR! hands basic 4).  708-06-2014  PWR! Moves in Flow and multi-directional movements   Consulted  and Agree with Plan of Care Patient          G-Codes - [id number]   Functional Assessment Tool Used full elbow extension without cues, L box and blocks:  56, standing functional reach  test:  R-12, L-11inches, verbalizes understanding of PD education   Functional Limitation Self care   Self Care Goal Status [phone]) At least 1 percent but less than 20 percent impaired, limited or restricted   Self Care Discharge Status [phone]) At least 1 percent but less than 20 percent impaired, limited or restricted      [place]  Visits from Start of Care: 9  Current functional level related to goals / functional outcomes: See above   Remaining deficits: Bradykinesia, decr coordination, decr ROM, now no R shoulder pain reported, abnormal posture--all improved   Education / Equipment: Pt instructed in PD specific HEP, strategies for ADLs/IADLs, appropriate community resources, and ways to prevent future complications.  Pt verbalized understanding of all education provided.    Plan: Patient agrees to discharge.  Patient goals were met. Patient is being discharged due to meeting the stated rehab goals.  Pt would benefit from re-evaluation/occupational therapy screen in approx 6 months to assess for need for further therapy/functional changes due to progressive nature of diagnosis and pt agrees with plan.  ?????       Problem List Patient Active Problem List   Diagnosis Date Noted  . Muscle pain 07/20/2014  . Shoulder pain 04/17/2014  . PD (Parkinson's disease) 06/05/2013  . Tremor 05/18/2013  . Irritation of ear 05/18/2013  . Medicare annual wellness visit, subsequent 05/02/2012  . Neck pain 05/02/2012  . Osteoporosis 03/08/2011  . Neck mass 03/08/2011  . Breast cancer 11/19/2010  . DYSPEPSIA&OTHER SPEC DISORDERS FUNCTION STOMACH 08/01/2008  . DYSPHAGIA UNSPECIFIED 08/01/2008  . ORTHOSTATIC HYPOTENSION 06/28/2008  . Goiter 07/10/2007  . HLD  (hyperlipidemia) 07/10/2007  . MONOCLONAL GAMMOPATHY 07/10/2007  . ANEMIA-NOS 07/10/2007  . FATIGUE 07/10/2007    [facility] 08/01/2014, 8:09 PM  [facility] [address] [place], [name], [address] Phone: [phone]   Fax:  [place], OTR/L 08/01/2014 8:09 PM   Vianne Bulls, OTR/L 12/09/2014 10:39 AM

## 2014-08-05 ENCOUNTER — Encounter: Payer: Medicare Other | Admitting: Occupational Therapy

## 2014-08-07 ENCOUNTER — Encounter: Payer: Medicare Other | Admitting: Occupational Therapy

## 2014-08-09 ENCOUNTER — Other Ambulatory Visit: Payer: Self-pay | Admitting: Internal Medicine

## 2014-08-09 DIAGNOSIS — C50919 Malignant neoplasm of unspecified site of unspecified female breast: Secondary | ICD-10-CM

## 2014-08-12 ENCOUNTER — Encounter: Payer: Self-pay | Admitting: Neurology

## 2014-08-12 ENCOUNTER — Ambulatory Visit (INDEPENDENT_AMBULATORY_CARE_PROVIDER_SITE_OTHER): Payer: Medicare Other | Admitting: Neurology

## 2014-08-12 VITALS — BP 116/68 | HR 84 | Ht 64.0 in | Wt 124.0 lb

## 2014-08-12 DIAGNOSIS — F482 Pseudobulbar affect: Secondary | ICD-10-CM

## 2014-08-12 DIAGNOSIS — G20 Parkinson's disease: Secondary | ICD-10-CM

## 2014-08-12 DIAGNOSIS — F4321 Adjustment disorder with depressed mood: Secondary | ICD-10-CM

## 2014-08-12 NOTE — Progress Notes (Signed)
[name] was seen today in the movement disorders clinic for neurologic consultation at the request of [name], MD.  The consultation is for the evaluation of tremor.  The records that were made available to me were reviewed.  No one accompanies the patient.   The first symptom(s) the patient noticed was unilateral hand tremor on the L and this was 4-5 months ago.  She is R hand dominant.  She states that it was intermittent when it started but now it is more constant.  No tremor elsewhere.    Only living relative with PD is a cousin, who is currently being treated at [facility].  08/17/13 update:  Pt states that tremor is improved on levodopa but not sure if it is otherwise helping (but may be).  Takes med at 8am/1pm/5-7pm.  No SE.  Blood pressure has been low but states that it is always low.  No lightheadedness.  Balance is not good, but no falls.  She completed therapy.  Is exercising at home.  She walks an hr a day 6 days per week and when she goes to the gym, she will use the treadmill.  Has signed up for PD exercise class.   11/20/13 update:  Pt f/u re: PD.  Is on carbidopa/levodopa 25/100 tid.  Is doing well.  No falls.  No lightheadedness.  No hallucinations.  Exercising faithfully.   Is having an aching pain in the shoulder and in the thumb region on the R side but PD primarily affects her on the L.  Some occasional tremor on the L.    04/10/14 update:  Pt is on carbidopa/levodopa 25/100 tid.  She is stable in regards to PD but unfortunately her husband just died 78 days ago.  She just started back exercising with her sister in law.  No SI/HI.  No falls.  No lightheadeness.  Some tremor but no more than usual.  08/12/14 update:  Pt is on carbidopa/levodopa 25/100 three times per day.  She has done PT/OT/ST since our last visit and just completed her last therapy session on 08/01/14.  She had an injection in her shoulder as well and then had therapy for that and it is feeling better.   No falls since our last visit.  No lightheadedness.  She is exercising faithfully.  She is walking for exercise 6 times a week.  She admits that she is crying a lot and states that it started before her husband died and sometimes it is crying over inconsequential stuff.  Sometimes, she cries over her husbands death but doesn't want medication for that.  She is not SI/HI.  Neuroimaging has not previously been performed.  It was supposed to be done 06/01/13 but pt cancelled as she was claustrophobic and couldn't do the scan even though it was in an open unit.   PREVIOUS MEDICATIONS: none to date  ALLERGIES:   Allergies  Allergen Reactions  . Penicillins Shortness Of Breath and Itching    CURRENT MEDICATIONS:  Current Outpatient Prescriptions on File Prior to Visit  Medication Sig Dispense Refill  . aspirin 81 MG tablet Take 81 mg by mouth daily.      [name] b complex vitamins tablet Take 1 tablet by mouth daily.    . Calcium 1500 MG tablet Take 1,500 mg by mouth.      . carbidopa-levodopa (SINEMET IR) 25-100 MG per tablet TAKE ONE TABLET BY MOUTH THREE TIMES DAILY 90 tablet 5  . cholecalciferol (VITAMIN  D) 1000 UNITS tablet Take 5,000 Units by mouth daily.      . Denosumab (PROLIA Doyle) Inject into the skin every 6 (six) months.    . Multiple Vitamin (MULTIVITAMIN) capsule 2 tabs po qd     . Omega-3 Fatty Acids (OMEGA 3 PO) 1 tab po qd     . tamoxifen (NOLVADEX) 20 MG tablet TAKE ONE TABLET BY MOUTH ONCE DAILY 30 tablet 0  . traMADol (ULTRAM) 50 MG tablet TAKE ONE TO TWO TABLETS BY MOUTH AT BEDTIME AS NEEDED FOR PAIN 60 tablet 5  . vitamin B-12 (CYANOCOBALAMIN) 500 MCG tablet Take 2500 mcg daily.    . nitroGLYCERIN (NITROSTAT) 0.3 MG SL tablet Place 0.3 mg under the tongue every 5 (five) minutes as needed.       No current facility-administered medications on file prior to visit.    PAST MEDICAL HISTORY:   Past Medical History  Diagnosis Date  . Esophageal Stricture   . Monoclonal gammopathy    . Anemia     Anemia-NOS / PMH, Dr Julien Nordmann  . Hyperlipidemia   . Vertical diplopia   . MVP (mitral valve prolapse)   . Cancer     stage I right breast  . Skin cancer     basal cell L neck  . Allergy     SEASONAL  . Arthritis   . Cataract     BILATERAL  . Osteoporosis     PAST SURGICAL HISTORY:   Past Surgical History  Procedure Laterality Date  . Total abdominal hysterectomy w/ bilateral salpingoophorectomy  74 yrs old    for pain,prolapse ; G3 P2  . Salpingoophorectomy  2002    for benign growths  . Hemorrhoid surgery  1970's  . Appendectomy  2002  . Cardiac catheterization  2005    neg  . Colonoscopy      negative   . Bone biopsy  11/06/10  . Breast lumpectomy  02/2010    right breast lumpectomy and sentinel node biopsy  . Upper gastrointestinal endoscopy      SOCIAL HISTORY:   History   Social History  . Marital Status: Widowed    Spouse Name: N/A  . Number of Children: 2  . Years of Education: N/A   Occupational History  . Retired    Social History Main Topics  . Smoking status: Never Smoker   . Smokeless tobacco: Never Used  . Alcohol Use: No  . Drug Use: No  . Sexual Activity: Not on file   Other Topics Concern  . Not on file   Social History Narrative   Occupation: Textile   Widowed 2016, husband had cancer, was married in 1959    Patient has never smoked.    Alcohol Use - no   Illicit Drug Use - no   Patient does not get regular exercise.    Daily Caffeine Use: 3 cups of coffee daily    FAMILY HISTORY:   Family Status  Relation Status Death Age  . Mother Deceased     heart disease  . Brother Alive     diabetes  . Brother Deceased     heart disease, renal failure  . Father Deceased     hepatitis  . Sister Alive     diabetes  . Brother Deceased     drowned as a teenager  . Brother Alive     diabetes  . Brother Alive     diabetes  . Sister Alive  diabetes, liver disease (alcohol)  . Sister Alive     healthy  . Son  Alive     sarcoidosis  . Daughter Alive     healthy  . Cousin Alive     Parkinsons disease    ROS:  A complete 10 system review of systems was obtained and was unremarkable apart from what is mentioned above.  PHYSICAL EXAMINATION:    VITALS:   Filed Vitals:   08/12/14 1012  BP: 116/68  Pulse: 84  Height: 5\' 4"  (1.626 m)  Weight: 124 lb (56.246 kg)    GEN:  The patient appears stated age and is in NAD.   HEENT:  Normocephalic, atraumatic.  The mucous membranes are moist. The superficial temporal arteries are without ropiness or tenderness. CV:  RRR Lungs:  CTAB Neck:  No bruits   Neurological examination:  Orientation: The patient is alert and oriented x3.  Cranial nerves: There is good facial symmetry. There is facial hypomimia. The speech is fluent and clear. Soft palate rises symmetrically and there is no tongue deviation. Hearing is intact to conversational tone. Sensation: Sensation is intact to light touch throughout Motor: Strength is 5/5 in the bilateral upper and lower extremities.   Shoulder shrug is equal and symmetric.  There is no pronator drift. Deep tendon reflexes: Deep tendon reflexes are 3/4 at the bilateral biceps, triceps, brachioradialis, patella and achilles. Plantar responses are downgoing bilaterally.  Movement examination: Tone: There is normal tone in the bilateral upper extremities Abnormal movements: There is no tremor noted today Coordination:  There is no decremation with RAM's Gait and Station: The patient has no difficulty arising out of a deep-seated chair without the use of the hands.  The patient's stride length is normal with slight decrease in arm swing on the L.  The patient has a negative pull test.      ASSESSMENT/PLAN:  1.  Idiopathic Parkinson's disease.  She was dx in May, 2015.    -She is doing well on carbidopa/levodopa 25/100 tid.    -encouraged continue to exercise.  Pt education provided.   2.  Situational depression with  superimposed PBA  -husband just died in early 2022/05/05.  Doesn't want meds and doing really well.  Has family that is supportive in the area. 3.  Follow up is anticipated in the next few months, sooner should new neurologic issues arise.

## 2014-08-21 DIAGNOSIS — Z01419 Encounter for gynecological examination (general) (routine) without abnormal findings: Secondary | ICD-10-CM | POA: Diagnosis not present

## 2014-08-22 ENCOUNTER — Ambulatory Visit: Payer: Medicare Other | Admitting: Occupational Therapy

## 2014-08-22 ENCOUNTER — Ambulatory Visit: Payer: Medicare Other

## 2014-08-22 ENCOUNTER — Ambulatory Visit: Payer: Medicare Other | Admitting: Physical Therapy

## 2014-09-03 ENCOUNTER — Other Ambulatory Visit: Payer: Self-pay | Admitting: Neurology

## 2014-09-03 NOTE — Telephone Encounter (Signed)
cARBIDOPA lEVODOPA refill requested. Per last office note- patient to remain on medication. Refill approved and sent to patient's pharmacy.

## 2014-09-12 ENCOUNTER — Other Ambulatory Visit: Payer: Self-pay | Admitting: Internal Medicine

## 2014-09-26 DIAGNOSIS — H2513 Age-related nuclear cataract, bilateral: Secondary | ICD-10-CM | POA: Diagnosis not present

## 2014-09-26 DIAGNOSIS — H5203 Hypermetropia, bilateral: Secondary | ICD-10-CM | POA: Diagnosis not present

## 2014-09-26 DIAGNOSIS — H53001 Unspecified amblyopia, right eye: Secondary | ICD-10-CM | POA: Diagnosis not present

## 2014-09-28 ENCOUNTER — Other Ambulatory Visit: Payer: Self-pay | Admitting: Family Medicine

## 2014-10-01 ENCOUNTER — Other Ambulatory Visit: Payer: Self-pay | Admitting: Family Medicine

## 2014-10-01 ENCOUNTER — Other Ambulatory Visit (HOSPITAL_BASED_OUTPATIENT_CLINIC_OR_DEPARTMENT_OTHER): Payer: Medicare Other

## 2014-10-01 DIAGNOSIS — C50919 Malignant neoplasm of unspecified site of unspecified female breast: Secondary | ICD-10-CM | POA: Diagnosis not present

## 2014-10-01 DIAGNOSIS — D472 Monoclonal gammopathy: Secondary | ICD-10-CM

## 2014-10-01 DIAGNOSIS — Z853 Personal history of malignant neoplasm of breast: Secondary | ICD-10-CM | POA: Diagnosis not present

## 2014-10-01 DIAGNOSIS — C50911 Malignant neoplasm of unspecified site of right female breast: Secondary | ICD-10-CM

## 2014-10-01 LAB — CBC WITH DIFFERENTIAL/PLATELET
BASO%: 0.9 % (ref 0.0–2.0)
Basophils Absolute: 0 10*3/uL (ref 0.0–0.1)
EOS%: 2.4 % (ref 0.0–7.0)
Eosinophils Absolute: 0.1 10*3/uL (ref 0.0–0.5)
HCT: 38.8 % (ref 34.8–46.6)
HGB: 12.9 g/dL (ref 11.6–15.9)
LYMPH%: 32.2 % (ref 14.0–49.7)
MCH: 32.8 pg (ref 25.1–34.0)
MCHC: 33.1 g/dL (ref 31.5–36.0)
MCV: 98.9 fL (ref 79.5–101.0)
MONO#: 0.2 10*3/uL (ref 0.1–0.9)
MONO%: 3.7 % (ref 0.0–14.0)
NEUT#: 3.3 10*3/uL (ref 1.5–6.5)
NEUT%: 60.8 % (ref 38.4–76.8)
Platelets: 222 10*3/uL (ref 145–400)
RBC: 3.92 10*6/uL (ref 3.70–5.45)
RDW: 14.1 % (ref 11.2–14.5)
WBC: 5.5 10*3/uL (ref 3.9–10.3)
lymph#: 1.8 10*3/uL (ref 0.9–3.3)

## 2014-10-01 LAB — COMPREHENSIVE METABOLIC PANEL (CC13)
ALT: 24 U/L (ref 0–55)
AST: 33 U/L (ref 5–34)
Albumin: 3.5 g/dL (ref 3.5–5.0)
Alkaline Phosphatase: 41 U/L (ref 40–150)
Anion Gap: 8 mEq/L (ref 3–11)
BUN: 10 mg/dL (ref 7.0–26.0)
CO2: 28 mEq/L (ref 22–29)
Calcium: 9 mg/dL (ref 8.4–10.4)
Chloride: 110 mEq/L — ABNORMAL HIGH (ref 98–109)
Creatinine: 0.8 mg/dL (ref 0.6–1.1)
EGFR: 72 mL/min/{1.73_m2} — ABNORMAL LOW (ref >90)
Glucose: 94 mg/dl (ref 70–140)
Potassium: 3.9 mEq/L (ref 3.5–5.1)
Sodium: 146 mEq/L — ABNORMAL HIGH (ref 136–145)
Total Bilirubin: 0.58 mg/dL (ref 0.20–1.20)
Total Protein: 6.8 g/dL (ref 6.4–8.3)

## 2014-10-01 LAB — LACTATE DEHYDROGENASE (CC13): LDH: 161 U/L (ref 125–245)

## 2014-10-01 NOTE — Telephone Encounter (Signed)
Rx called to pharmacy

## 2014-10-01 NOTE — Telephone Encounter (Signed)
Please call in.  Thanks.   

## 2014-10-01 NOTE — Telephone Encounter (Signed)
Received refill request electronically Last office visit 07/18/14 Last refill 01/06/14 #60/5 Is it okay to refill?

## 2014-10-03 LAB — IGG, IGA, IGM
IgA: 39 mg/dL — ABNORMAL LOW (ref 69–380)
IgG (Immunoglobin G), Serum: 1490 mg/dL (ref 690–1700)
IgM, Serum: 24 mg/dL — ABNORMAL LOW (ref 52–322)

## 2014-10-03 LAB — BETA 2 MICROGLOBULIN, SERUM: Beta-2 Microglobulin: 1.45 mg/L (ref <=2.51)

## 2014-10-03 LAB — KAPPA/LAMBDA LIGHT CHAINS
Kappa free light chain: 6.63 mg/dL — ABNORMAL HIGH (ref 0.33–1.94)
Kappa:Lambda Ratio: 9.9 — ABNORMAL HIGH (ref 0.26–1.65)
Lambda Free Lght Chn: 0.67 mg/dL (ref 0.57–2.63)

## 2014-10-03 LAB — CANCER ANTIGEN 27.29: CA 27.29: 17 U/mL (ref 0–39)

## 2014-10-08 ENCOUNTER — Ambulatory Visit (HOSPITAL_BASED_OUTPATIENT_CLINIC_OR_DEPARTMENT_OTHER): Payer: Medicare Other | Admitting: Internal Medicine

## 2014-10-08 ENCOUNTER — Telehealth: Payer: Self-pay | Admitting: Internal Medicine

## 2014-10-08 ENCOUNTER — Ambulatory Visit (HOSPITAL_BASED_OUTPATIENT_CLINIC_OR_DEPARTMENT_OTHER): Payer: Medicare Other

## 2014-10-08 ENCOUNTER — Encounter: Payer: Self-pay | Admitting: Internal Medicine

## 2014-10-08 VITALS — BP 125/54 | HR 80 | Temp 98.5°F | Resp 18 | Ht 64.0 in | Wt 124.7 lb

## 2014-10-08 DIAGNOSIS — C50911 Malignant neoplasm of unspecified site of right female breast: Secondary | ICD-10-CM | POA: Diagnosis not present

## 2014-10-08 DIAGNOSIS — D472 Monoclonal gammopathy: Secondary | ICD-10-CM

## 2014-10-08 DIAGNOSIS — M810 Age-related osteoporosis without current pathological fracture: Secondary | ICD-10-CM

## 2014-10-08 DIAGNOSIS — Z23 Encounter for immunization: Secondary | ICD-10-CM | POA: Diagnosis not present

## 2014-10-08 MED ORDER — INFLUENZA VAC SPLIT QUAD 0.5 ML IM SUSY
0.5000 mL | PREFILLED_SYRINGE | Freq: Once | INTRAMUSCULAR | Status: AC
  Administered 2014-10-08: 0.5 mL via INTRAMUSCULAR
  Filled 2014-10-08: qty 0.5

## 2014-10-08 MED ORDER — DENOSUMAB 60 MG/ML ~~LOC~~ SOLN
60.0000 mg | Freq: Once | SUBCUTANEOUS | Status: AC
  Administered 2014-10-08: 60 mg via SUBCUTANEOUS
  Filled 2014-10-08: qty 1

## 2014-10-08 NOTE — Progress Notes (Signed)
[place] [phone]   [phone]  OFFICE PROGRESS NOTE  Megan Stain, [name] [name] [address]  DIAGNOSIS:  1) Node-negative breast cancer status post completion of radiation 05/06/2010 currently on tamoxifen as well as prolia.  2) Monoclonal gammopathy of unknown significance on observation since 2007   PRIOR THERAPY:  1) status post right lumpectomy on 03/05/2010 and it showed invasive ductal carcinoma 0.9 CM, Positive for ER/PR and negative for HER-2, with ductal carcinoma in situ present and negative sentinel lymph node biopsies.  2) status post adjuvant radiotherapy completed in April of 2012   CURRENT THERAPY:  1) tamoxifen 20 mg by mouth daily started in 2012.  2) Prolia subcutaneous injection every 6 months.  INTERVAL HISTORY: [name] 74 y.o. female returns to the clinic today for 6 months followup visit. The patient has no significant complaints today. She is tolerating her treatment with tamoxifen fairly well. She denied having any significant weight loss or night sweats. The patient has no nausea or vomiting. She denied having any significant chest pain, shortness of breath, cough or hemoptysis. She had repeat myeloma panel performed as well as CA 27.29 recently and she is here for evaluation and discussion of her lab results.   MEDICAL HISTORY: Past Medical History  Diagnosis Date  . Esophageal Stricture   . Monoclonal gammopathy   . Anemia     Anemia-NOS / PMH, Dr Julien Nordmann  . Hyperlipidemia   . Vertical diplopia   . MVP (mitral valve prolapse)   . Cancer     stage I right breast  . Skin cancer     basal cell L neck  . Allergy     SEASONAL  . Arthritis   . Cataract     BILATERAL  . Osteoporosis     ALLERGIES:  is allergic to penicillins.  MEDICATIONS:  Current Outpatient Prescriptions  Medication Sig Dispense Refill  . aspirin 81 MG tablet Take 81 mg by mouth daily.      [name] b  complex vitamins tablet Take 1 tablet by mouth daily.    . Calcium 1500 MG tablet Take 1,500 mg by mouth.      . carbidopa-levodopa (SINEMET IR) 25-100 MG per tablet TAKE ONE TABLET BY MOUTH THREE TIMES DAILY 90 tablet 5  . cholecalciferol (VITAMIN D) 1000 UNITS tablet Take 5,000 Units by mouth daily.      . Denosumab (PROLIA [place]) Inject into the skin every 6 (six) months.    . Multiple Vitamin (MULTIVITAMIN) capsule 2 tabs po qd     . nitroGLYCERIN (NITROSTAT) 0.3 MG SL tablet Place 0.3 mg under the tongue every 5 (five) minutes as needed.      . Omega-3 Fatty Acids (OMEGA 3 PO) 1 tab po qd     . tamoxifen (NOLVADEX) 20 MG tablet TAKE ONE TABLET BY MOUTH ONCE DAILY 30 tablet 0  . traMADol (ULTRAM) 50 MG tablet TAKE ONE TO TWO TABLETS BY MOUTH AT BEDTIME AS NEEDED FOR PAIN 60 tablet 5  . vitamin B-12 (CYANOCOBALAMIN) 500 MCG tablet Take 2500 mcg daily.     No current facility-administered medications for this visit.    SURGICAL HISTORY:  Past Surgical History  Procedure Laterality Date  . Total abdominal hysterectomy w/ bilateral salpingoophorectomy  74 yrs old    for pain,prolapse ; G3 P2  . Salpingoophorectomy  2002    for benign growths  . Hemorrhoid surgery  1970's  . Appendectomy  2002  . Cardiac catheterization  2005    neg  . Colonoscopy      negative   . Bone biopsy  11/06/10  . Breast lumpectomy  02/2010    right breast lumpectomy and sentinel node biopsy  . Upper gastrointestinal endoscopy      REVIEW OF SYSTEMS:  A comprehensive review of systems was negative.   PHYSICAL EXAMINATION: General appearance: alert, cooperative and no distress Head: Normocephalic, without obvious abnormality, atraumatic Neck: no adenopathy, no JVD, supple, symmetrical, trachea midline and thyroid not enlarged, symmetric, no tenderness/mass/nodules Lymph nodes: Cervical, supraclavicular, and axillary nodes normal. Resp: clear to auscultation bilaterally Cardio: regular rate and rhythm, S1,  S2 normal, no murmur, click, rub or gallop GI: soft, non-tender; bowel sounds normal; no masses,  no organomegaly Extremities: extremities normal, atraumatic, no cyanosis or edema Breast exam showed no palpable masses in the breast or axilla bilaterally.  ECOG PERFORMANCE STATUS: 0 - Asymptomatic  Blood pressure 125/54, pulse 80, temperature 98.5 F (36.9 C), temperature source Oral, resp. rate 18, height 5' 4" (1.626 m), weight 124 lb 11.2 oz (56.564 kg), SpO2 100 %.  LABORATORY DATA: Lab Results  Component Value Date   WBC 5.5 10/01/2014   HGB 12.9 10/01/2014   HCT 38.8 10/01/2014   MCV 98.9 10/01/2014   PLT 222 10/01/2014      Chemistry      Component Value Date/Time   NA 146* 10/01/2014 1102   NA 142 06/15/2011 1327   K 3.9 10/01/2014 1102   K 4.2 06/15/2011 1327   CL 107 03/21/2012 1501   CL 108 06/15/2011 1327   CO2 28 10/01/2014 1102   CO2 26 06/15/2011 1327   BUN 10.0 10/01/2014 1102   BUN 9 06/15/2011 1327   CREATININE 0.8 10/01/2014 1102   CREATININE 0.85 06/15/2011 1327      Component Value Date/Time   CALCIUM 9.0 10/01/2014 1102   CALCIUM 8.9 06/15/2011 1327   ALKPHOS 41 10/01/2014 1102   ALKPHOS 44 06/15/2011 1327   AST 33 10/01/2014 1102   AST 25 06/15/2011 1327   ALT 24 10/01/2014 1102   ALT 14 06/15/2011 1327   BILITOT 0.58 10/01/2014 1102   BILITOT 0.3 06/15/2011 1327       RADIOGRAPHIC STUDIES: No results found.  ASSESSMENT AND PLAN: This is a very pleasant 74 years old white female with history of monoclonal gammopathy of undetermined significance and has been observation since 2007 in addition to diagnosis of node-negative breast cancer in 2012 and currently on treatment with tamoxifen as well as Prolia every 6 months. The myeloma panel performed recently showed no evidence for disease progression.  For the MGUS, I recommended for her to continue on observation with repeat myeloma panel in 6 months. For the breast cancer, her tumor marker  is within the normal range. She will complete 5 years of treatment with tamoxifen before consideration of switch to aromatase inhibitor. She would have repeat CA 27.29 in 6 months. The patient will continue with her routine screening mammogram as a scheduled. She was advised to call immediately if she has any concerning symptoms in the interval.  The patient voices understanding of current disease status and treatment options and is in agreement with the current care plan.  All questions were answered. The patient knows to call the clinic with any problems, questions or concerns. We can certainly see the patient much sooner if necessary.  Disclaimer: This note was dictated   with voice recognition software. Similar sounding words can inadvertently be transcribed and may not be corrected upon review.

## 2014-10-08 NOTE — Telephone Encounter (Signed)
per pof to sch pt appt-gave tp copy of avs °

## 2014-10-14 ENCOUNTER — Other Ambulatory Visit: Payer: Self-pay | Admitting: Internal Medicine

## 2014-11-13 ENCOUNTER — Other Ambulatory Visit: Payer: Self-pay | Admitting: Internal Medicine

## 2014-11-13 DIAGNOSIS — C50919 Malignant neoplasm of unspecified site of unspecified female breast: Secondary | ICD-10-CM

## 2014-12-09 ENCOUNTER — Encounter: Payer: Self-pay | Admitting: Family Medicine

## 2014-12-09 ENCOUNTER — Ambulatory Visit (INDEPENDENT_AMBULATORY_CARE_PROVIDER_SITE_OTHER): Payer: Medicare Other | Admitting: Family Medicine

## 2014-12-09 ENCOUNTER — Encounter: Payer: Self-pay | Admitting: Occupational Therapy

## 2014-12-09 VITALS — BP 100/60 | HR 86 | Temp 98.5°F | Wt 118.0 lb

## 2014-12-09 DIAGNOSIS — S51812A Laceration without foreign body of left forearm, initial encounter: Secondary | ICD-10-CM

## 2014-12-09 DIAGNOSIS — L989 Disorder of the skin and subcutaneous tissue, unspecified: Secondary | ICD-10-CM | POA: Diagnosis not present

## 2014-12-09 NOTE — Therapy (Signed)
Encounter created in error

## 2014-12-09 NOTE — Progress Notes (Signed)
Pre visit review using our clinic review tool, if applicable. No additional management support is needed unless otherwise documented below in the visit note.  R 2nd webspace with toes rubbing.  Tender.  No drainage, no redness, no fevers.  Can still bear weight.  Noted in the last few weeks.    Skin tear on L ext wrist.  Triangle shaped tear, 2cm on each side. Happened about 36 hours ago.  She cleaned it and covered the area.  Tetanus 2011.  Not sore. She has bumped her arm on the microwave.  Not from a fall.    Meds, vitals, and allergies reviewed.   ROS: See HPI.  Otherwise, noncontributory.  nad L arm with clean triangle shaped tear, 2cm on each side, recovered.  The flap is bruised and will likely not survive but will likely function as a scab in the meantime.   R foot with irritation on lateral 2nd toe (72mm) and medial 3rd toe (43mm).  No ulceration.  No drainage.

## 2014-12-09 NOTE — Patient Instructions (Signed)
Use a nonstick bandage between the toes and let me know if that doesn't improve.  Take care.

## 2014-12-10 DIAGNOSIS — L989 Disorder of the skin and subcutaneous tissue, unspecified: Secondary | ICD-10-CM | POA: Insufficient documentation

## 2014-12-10 DIAGNOSIS — S51819A Laceration without foreign body of unspecified forearm, initial encounter: Secondary | ICD-10-CM | POA: Insufficient documentation

## 2014-12-10 NOTE — Assessment & Plan Note (Signed)
Not a frank ulcer.  Strip of nonstick bandage cut and placed in the webspace, folded over and under 1st and 2nd toes.  Taped down.  Felt better.  Should dec rubbing and should gradually heal..  Doesn't need debridement or abx at this point.  D/w pt.  She agrees.

## 2014-12-10 NOTE — Assessment & Plan Note (Signed)
Should heal slowly, d/w pt.  Keep clean and covered.  Doesn't appear infected.

## 2014-12-16 ENCOUNTER — Encounter: Payer: Self-pay | Admitting: Neurology

## 2014-12-16 ENCOUNTER — Other Ambulatory Visit: Payer: Self-pay | Admitting: Internal Medicine

## 2014-12-16 ENCOUNTER — Ambulatory Visit (INDEPENDENT_AMBULATORY_CARE_PROVIDER_SITE_OTHER): Payer: Medicare Other | Admitting: Neurology

## 2014-12-16 VITALS — BP 126/62 | HR 96 | Ht 64.0 in | Wt 114.0 lb

## 2014-12-16 DIAGNOSIS — G20 Parkinson's disease: Secondary | ICD-10-CM

## 2014-12-16 DIAGNOSIS — F4321 Adjustment disorder with depressed mood: Secondary | ICD-10-CM | POA: Diagnosis not present

## 2014-12-16 NOTE — Progress Notes (Signed)
[name] was seen today in the movement disorders clinic for neurologic consultation at the request of Megan Stain, MD.  The consultation is for the evaluation of tremor.  The records that were made available to me were reviewed.  No one accompanies the patient.   The first symptom(s) the patient noticed was unilateral hand tremor on the L and this was 4-5 months ago.  She is R hand dominant.  She states that it was intermittent when it started but now it is more constant.  No tremor elsewhere.    Only living relative with PD is a cousin, who is currently being treated at [facility].  08/17/13 update:  Pt states that tremor is improved on levodopa but not sure if it is otherwise helping (but may be).  Takes med at 8am/1pm/5-7pm.  No SE.  Blood pressure has been low but states that it is always low.  No lightheadedness.  Balance is not good, but no falls.  She completed therapy.  Is exercising at home.  She walks an hr a day 6 days per week and when she goes to the gym, she will use the treadmill.  Has signed up for PD exercise class.   11/20/13 update:  Pt f/u re: PD.  Is on carbidopa/levodopa 25/100 tid.  Is doing well.  No falls.  No lightheadedness.  No hallucinations.  Exercising faithfully.   Is having an aching pain in the shoulder and in the thumb region on the R side but PD primarily affects her on the L.  Some occasional tremor on the L.    04/10/14 update:  Pt is on carbidopa/levodopa 25/100 tid.  She is stable in regards to PD but unfortunately her husband just died 70 days ago.  She just started back exercising with her sister in law.  No SI/HI.  No falls.  No lightheadeness.  Some tremor but no more than usual.  08/12/14 update:  Pt is on carbidopa/levodopa 25/100 three times per day.  She has done PT/OT/ST since our last visit and just completed her last therapy session on 08/01/14.  She had an injection in her shoulder as well and then had therapy for that and it is feeling better.   No falls since our last visit.  No lightheadedness.  She is exercising faithfully.  She is walking for exercise 6 times a week.  She admits that she is crying a lot and states that it started before her husband died and sometimes it is crying over inconsequential stuff.  Sometimes, she cries over her husbands death but doesn't want medication for that.  She is not SI/HI.  12/16/14 update:  Pt is following up today and is still on carbidopa/levodopa 25/100 tid.   No significant changes since last visit.  She goes to the gym 6 days a week.  Sometimes, she will go two times a day.   She is going to be spending thanksgiving with her son.    Wearing off:  No.  How long before next dose:  n/a Falls:   No. N/V:  No. (had some nausea this past weekend but thinks related to food she ate) Hallucinations:  No.  visual distortions: No. Lightheaded:  Yes.  , just last few days  Syncope: No. Dyskinesia:  No.   Neuroimaging has not previously been performed.  It was supposed to be done 06/01/13 but pt cancelled as she was claustrophobic and couldn't do the scan even though it was in an  open unit.   PREVIOUS MEDICATIONS: none to date  ALLERGIES:   Allergies  Allergen Reactions  . Penicillins Shortness Of Breath and Itching    CURRENT MEDICATIONS:  Current Outpatient Prescriptions on File Prior to Visit  Medication Sig Dispense Refill  . aspirin 81 MG tablet Take 81 mg by mouth daily.      [name] b complex vitamins tablet Take 1 tablet by mouth daily.    . Calcium 1500 MG tablet Take 1,500 mg by mouth.      . carbidopa-levodopa (SINEMET IR) 25-100 MG per tablet TAKE ONE TABLET BY MOUTH THREE TIMES DAILY 90 tablet 5  . cholecalciferol (VITAMIN D) 1000 UNITS tablet Take 5,000 Units by mouth daily.      . Denosumab (PROLIA [place]) Inject into the skin every 6 (six) months.    . Multiple Vitamin (MULTIVITAMIN) capsule 2 tabs po qd     . Omega-3 Fatty Acids (OMEGA 3 PO) 1 tab po qd     . tamoxifen (NOLVADEX) 20 MG  tablet TAKE ONE TABLET BY MOUTH ONCE DAILY 30 tablet 0  . traMADol (ULTRAM) 50 MG tablet TAKE ONE TO TWO TABLETS BY MOUTH AT BEDTIME AS NEEDED FOR PAIN 60 tablet 5  . vitamin B-12 (CYANOCOBALAMIN) 500 MCG tablet Take 2500 mcg daily.    . nitroGLYCERIN (NITROSTAT) 0.3 MG SL tablet Place 0.3 mg under the tongue every 5 (five) minutes as needed.       No current facility-administered medications on file prior to visit.    PAST MEDICAL HISTORY:   Past Medical History  Diagnosis Date  . Esophageal Stricture   . Monoclonal gammopathy   . Anemia     Anemia-NOS / PMH, Dr Julien Nordmann  . Hyperlipidemia   . Vertical diplopia   . MVP (mitral valve prolapse)   . Cancer (HCC)     stage I right breast  . Skin cancer     basal cell L neck  . Allergy     SEASONAL  . Arthritis   . Cataract     BILATERAL  . Osteoporosis     PAST SURGICAL HISTORY:   Past Surgical History  Procedure Laterality Date  . Total abdominal hysterectomy w/ bilateral salpingoophorectomy  74 yrs old    for pain,prolapse ; G3 P2  . Salpingoophorectomy  2002    for benign growths  . Hemorrhoid surgery  1970's  . Appendectomy  2002  . Cardiac catheterization  2005    neg  . Colonoscopy      negative   . Bone biopsy  11/06/10  . Breast lumpectomy  02/2010    right breast lumpectomy and sentinel node biopsy  . Upper gastrointestinal endoscopy      SOCIAL HISTORY:   Social History   Social History  . Marital Status: Widowed    Spouse Name: N/A  . Number of Children: 2  . Years of Education: N/A   Occupational History  . Retired    Social History Main Topics  . Smoking status: Never Smoker   . Smokeless tobacco: Never Used  . Alcohol Use: No  . Drug Use: No  . Sexual Activity: Not on file   Other Topics Concern  . Not on file   Social History Narrative   Occupation: Textile   Widowed 2016, husband had cancer, was married in 1959    Patient has never smoked.    Alcohol Use - no   Illicit Drug Use -  no  Patient does not get regular exercise.    Daily Caffeine Use: 3 cups of coffee daily    FAMILY HISTORY:   Family Status  Relation Status Death Age  . Mother Deceased     heart disease  . Brother Alive     diabetes  . Brother Deceased     heart disease, renal failure  . Father Deceased     hepatitis  . Sister Alive     diabetes  . Brother Deceased     drowned as a teenager  . Brother Alive     diabetes  . Brother Alive     diabetes  . Sister Alive     diabetes, liver disease (alcohol)  . Sister Alive     healthy  . Son Alive     sarcoidosis  . Daughter Alive     healthy  . Cousin Alive     Parkinsons disease    ROS:  A complete 10 system review of systems was obtained and was unremarkable apart from what is mentioned above.  PHYSICAL EXAMINATION:    VITALS:   Filed Vitals:   12/16/14 1036  BP: 126/62  Pulse: 96  Height: 5\' 4"  (1.626 m)  Weight: 114 lb (51.71 kg)    GEN:  The patient appears stated age and is in NAD.   HEENT:  Normocephalic, atraumatic.  The mucous membranes are moist. The superficial temporal arteries are without ropiness or tenderness. CV:  RRR Lungs:  CTAB Neck:  No bruits   Neurological examination:  Orientation: The patient is alert and oriented x3.  Cranial nerves: There is good facial symmetry. There is facial hypomimia. The speech is fluent and clear. Soft palate rises symmetrically and there is no tongue deviation. Hearing is intact to conversational tone. Sensation: Sensation is intact to light touch throughout Motor: Strength is 5/5 in the bilateral upper and lower extremities.   Shoulder shrug is equal and symmetric.  There is no pronator drift. Deep tendon reflexes: Deep tendon reflexes are 3/4 at the bilateral biceps, triceps, brachioradialis, patella and achilles. Plantar responses are downgoing bilaterally.  Movement examination: Tone: There is normal tone in the bilateral upper extremities Abnormal movements: There  is LUE tremor, mild (hasn't taken medication yet today) Coordination:  There is decremation with finger taps and hand opening/closing on the L (hasn't taken medication today) Gait and Station: The patient has no difficulty arising out of a deep-seated chair without the use of the hands.  The patient's stride length is normal with slight decrease in arm swing on the L.    ASSESSMENT/PLAN:  1.  Idiopathic Parkinson's disease.  She was dx in May, 2015.    -She is doing well on carbidopa/levodopa 25/100 tid.  Has to pick up medication from the pharmacy and   -encouraged continue to exercise.  Pt education provided.   2.  Situational depression with superimposed PBA  -husband just died in early 2022-04-07.  Doesn't want meds and doing really well.  Today would have been 57th wedding anniversary so tearful in room.  Has family that is supportive in the area. 3.  Follow up is anticipated in the next few months, sooner should new neurologic issues arise.

## 2015-01-15 ENCOUNTER — Other Ambulatory Visit: Payer: Self-pay | Admitting: Internal Medicine

## 2015-01-28 DIAGNOSIS — Z853 Personal history of malignant neoplasm of breast: Secondary | ICD-10-CM | POA: Diagnosis not present

## 2015-01-30 ENCOUNTER — Ambulatory Visit (INDEPENDENT_AMBULATORY_CARE_PROVIDER_SITE_OTHER): Payer: Medicare Other | Admitting: Family Medicine

## 2015-01-30 ENCOUNTER — Telehealth: Payer: Self-pay | Admitting: Family Medicine

## 2015-01-30 VITALS — BP 120/70 | HR 89 | Temp 98.8°F | Resp 20 | Ht 65.0 in | Wt 120.0 lb

## 2015-01-30 DIAGNOSIS — R059 Cough, unspecified: Secondary | ICD-10-CM

## 2015-01-30 DIAGNOSIS — R05 Cough: Secondary | ICD-10-CM

## 2015-01-30 DIAGNOSIS — J329 Chronic sinusitis, unspecified: Secondary | ICD-10-CM

## 2015-01-30 DIAGNOSIS — J310 Chronic rhinitis: Secondary | ICD-10-CM

## 2015-01-30 MED ORDER — HYDROCODONE-HOMATROPINE 5-1.5 MG/5ML PO SYRP: 5.0000 mL | ORAL_SOLUTION | ORAL | Status: DC | PRN

## 2015-01-30 MED ORDER — AZITHROMYCIN 250 MG PO TABS: ORAL_TABLET | ORAL | Status: DC

## 2015-01-30 MED ORDER — BENZONATATE 100 MG PO CAPS
100.0000 mg | ORAL_CAPSULE | Freq: Three times a day (TID) | ORAL | Status: DC | PRN
Start: 2015-01-30 — End: 2015-02-24

## 2015-01-30 MED ORDER — ALBUTEROL SULFATE (2.5 MG/3ML) 0.083% IN NEBU: 2.5000 mg | INHALATION_SOLUTION | Freq: Once | RESPIRATORY_TRACT | Status: DC

## 2015-01-30 NOTE — Telephone Encounter (Signed)
Spoke with pt and she thinks she may go to [place] after talking with her son. Pt will cb if needed.

## 2015-01-30 NOTE — Telephone Encounter (Signed)
Patient Name: [name]  DOB: October 10, 1940    Initial Comment Caller states headache; sore throat; she has been taken a medication    Nurse Assessment  Nurse: Orvan Seen, RN, Jacquilin Date/Time (Eastern Time): 01/30/2015 10:10:25 AM  Confirm and document reason for call. If symptomatic, describe symptoms. ---Caller states headache with pain in the face (sinus); sore throat; she has been taken a medication - Pain a 8/10- Symptoms started Sunday  Has the patient traveled out of the country within the last 30 days? ---No  Does the patient have any new or worsening symptoms? ---Yes  Will a triage be completed? ---Yes  Related visit to physician within the last 2 weeks? ---No  Does the PT have any chronic conditions? (i.e. diabetes, asthma, etc.) ---Yes  Is this a behavioral health or substance abuse call? ---No     Guidelines    Guideline Title Affirmed Question Affirmed Notes  Sinus Pain or Congestion [1] SEVERE pain AND [2] not improved 2 hours after pain medicine    Final Disposition User   See Physician within 4 Hours (or PCP triage) Orvan Seen, RN, Jacquilin    Comments  Caller states she will check with the UC and may go in but is not sure.   Referrals  GO TO FACILITY UNDECIDED   Disagree/Comply: Disagree  Disagree/Comply Reason: Disagree with instructions

## 2015-01-30 NOTE — Telephone Encounter (Signed)
Noted. Thanks.

## 2015-01-30 NOTE — Patient Instructions (Addendum)
Drink plenty of fluids and get enough rest  Take azithromycin (Z-Pak) 2 pills initially, then 1 daily for 4 days  Take Hycodan cough syrup 1 teaspoon every 4-6 hours as needed for cough. This is best at nighttime or when it doesn't affect you to be drowsy.  Take benzonatate cough pills one or 2 pills 3 times daily as needed for daytime cough. This will not cause sedation.  Mucinex is good to helps thin the secretions  Take Tylenol (acetaminophen) 1000 mg 3 times daily or Advil (ibuprofen) 2-3 pills 3 times daily as needed for headache  Return if worse or not improving

## 2015-01-30 NOTE — Progress Notes (Signed)
Patient ID: [name], female    DOB: 01/10/1941  Age: 75 y.o. MRN: [id number]  Chief Complaint  Patient presents with  . Sore Throat    today  . Cough  . Headache    Subjective:   75 year old lady who has been sick for about 5 days. It started with upper respiratory problems. She's had head congestion and lots of "snotty" in the back of her throat. She has had some sore throat with this. She is coughing. She does cough somewhat night. She has not been running fevers. She contacted her doctor who could not see her today, and they told her to go in and get checked at an urgent care today. She came over here rather than having to [place] since she lives in between. She is retired. Mostly at home.  Current allergies, medications, problem list, past/family and social histories reviewed.  Objective:  BP 120/70 mmHg  Pulse 89  Temp(Src) 98.8 F (37.1 C) (Oral)  Resp 20  Ht 5\' 5"  (1.651 m)  Wt 120 lb (54.432 kg)  BMI 19.97 kg/m2  SpO2 98%  No major acute distress. TMs normal. Throat not erythematous. She has postnasal drainage and congestion. Neck supple with small nodes on the right side. Chest is clear to auscultation. Heart regular without murmurs.  Assessment & Plan:   Assessment: 1. Rhinosinusitis   2. Cough       Plan: Treat for sinusitis with all the postnasal drainage.    Meds ordered this encounter  Medications  . DISCONTD: albuterol (PROVENTIL) (2.5 MG/3ML) 0.083% nebulizer solution 2.5 mg    Sig:   . azithromycin (ZITHROMAX) 250 MG tablet    Sig: Take 2 tabs PO x 1 dose, then 1 tab PO QD x 4 days    Dispense:  6 tablet    Refill:  0  . benzonatate (TESSALON) 100 MG capsule    Sig: Take 1-2 capsules (100-200 mg total) by mouth 3 (three) times daily as needed.    Dispense:  30 capsule    Refill:  0  . HYDROcodone-homatropine (HYCODAN) 5-1.5 MG/5ML syrup    Sig: Take 5 mLs by mouth every 4 (four) hours as needed.    Dispense:  120 mL    Refill:  0          Patient Instructions  Drink plenty of fluids and get enough rest  Take azithromycin (Z-Pak) 2 pills initially, then 1 daily for 4 days  Take Hycodan cough syrup 1 teaspoon every 4-6 hours as needed for cough. This is best at nighttime or when it doesn't affect you to be drowsy.  Take benzonatate cough pills one or 2 pills 3 times daily as needed for daytime cough. This will not cause sedation.  Mucinex is good to helps thin the secretions  Take Tylenol (acetaminophen) 1000 mg 3 times daily or Advil (ibuprofen) 2-3 pills 3 times daily as needed for headache  Return if worse or not improving     No Follow-up on file.   HOPPER,DAVID, MD 01/30/2015

## 2015-02-03 ENCOUNTER — Encounter: Payer: Self-pay | Admitting: Family Medicine

## 2015-02-04 ENCOUNTER — Encounter: Payer: Self-pay | Admitting: *Deleted

## 2015-02-15 ENCOUNTER — Other Ambulatory Visit: Payer: Self-pay | Admitting: Internal Medicine

## 2015-02-19 DIAGNOSIS — Z1283 Encounter for screening for malignant neoplasm of skin: Secondary | ICD-10-CM | POA: Diagnosis not present

## 2015-02-24 ENCOUNTER — Encounter: Payer: Self-pay | Admitting: Family Medicine

## 2015-02-24 ENCOUNTER — Ambulatory Visit (INDEPENDENT_AMBULATORY_CARE_PROVIDER_SITE_OTHER): Payer: Medicare Other | Admitting: Family Medicine

## 2015-02-24 VITALS — BP 102/64 | HR 93 | Temp 98.2°F | Wt 123.5 lb

## 2015-02-24 DIAGNOSIS — M79671 Pain in right foot: Secondary | ICD-10-CM | POA: Diagnosis not present

## 2015-02-24 NOTE — Progress Notes (Signed)
Pre visit review using our clinic review tool, if applicable. No additional management support is needed unless otherwise documented below in the visit note.  R foot troubles.  She had a sore between the toes but that got better.   Now with pain in the R arch/plantar side when walking, "like I'm walking on a rock."  No pain in the AM before she gets out of bed.  Pain with first step in the AM.  More pain when barefoot.  Better with rest or in shoes.  No trauma.  No falls.  Pain going on for the last few weeks.    Meds, vitals, and allergies reviewed.   ROS: See HPI.  Otherwise, noncontributory.  nad ncat R foot with normal inspection.  No skin breakdown. She does have a likely varicose vein on the bottom of the R foot, but that isn't ttp.  She is ttp on the plantar fascia.   O/w, not ttp on the foot.  Able to bear weight.

## 2015-02-25 DIAGNOSIS — M79673 Pain in unspecified foot: Secondary | ICD-10-CM | POA: Insufficient documentation

## 2015-02-25 NOTE — Assessment & Plan Note (Signed)
Likely plantar fasciitis, d/w pt about stretching, anatomy, exercises.  Handout given to and d/w pt.  She agrees.  She'll use home exercises and update me as needed.  Okay for outpatient fu.  No need for imaging.  She agrees.

## 2015-03-19 ENCOUNTER — Other Ambulatory Visit: Payer: Self-pay | Admitting: Internal Medicine

## 2015-03-25 ENCOUNTER — Ambulatory Visit: Payer: Medicare Other | Attending: Family Medicine | Admitting: Physical Therapy

## 2015-03-25 ENCOUNTER — Ambulatory Visit: Payer: Medicare Other

## 2015-03-25 ENCOUNTER — Ambulatory Visit: Payer: Medicare Other | Admitting: Occupational Therapy

## 2015-03-25 ENCOUNTER — Telehealth: Payer: Self-pay | Admitting: Neurology

## 2015-03-25 DIAGNOSIS — R1310 Dysphagia, unspecified: Secondary | ICD-10-CM

## 2015-03-25 DIAGNOSIS — G20 Parkinson's disease: Secondary | ICD-10-CM

## 2015-03-25 DIAGNOSIS — R258 Other abnormal involuntary movements: Secondary | ICD-10-CM

## 2015-03-25 DIAGNOSIS — R471 Dysarthria and anarthria: Secondary | ICD-10-CM | POA: Insufficient documentation

## 2015-03-25 NOTE — Telephone Encounter (Signed)
Order entered

## 2015-03-25 NOTE — Therapy (Signed)
[facility] [address] [place], [name], [address] Phone: [phone]   Fax:  [phone]  Patient Details  Name: PRIM AMOAH MRN: [id number] Date of Birth: [birth year] Referring Provider:  Tonia Ghent, MD  Encounter Date: 03/25/2015   Physical Therapy Parkinson's Disease Screen   Timed Up and Go test:  7.95 sec  10 meter walk test:  9.16 sec (3.58 ft/sec)  5 time sit to stand test:  10.58 sec  Patient does not appear to require physical therapy services at this time.  Recommend PT screen in 6-9 months.    Frazier Butt., PT      Mady Haagensen W. 03/25/2015, 1:33 PM  [facility] [address] [place], [name], [address] Phone: [phone]   Fax:  [phone]

## 2015-03-25 NOTE — Telephone Encounter (Signed)
-----   Message from [place], OT sent at 03/25/2015  1:59 PM EST ----- Regarding: OT recommendation Dr. Carles Collet,   I screened Ms. [name] today for OT and she is having a lot of R shoulder pain (8-9/10) and recommend OT eval.  She has had this in the past, but she was pain free at last OT d/c.  Please send OT referral via Epic if you agree.  Thanks,   Vianne Bulls, OTR/L [facility] [address] [place], [place]  [address] [phone] phone [phone] 03/25/2015 2:04 PM

## 2015-03-25 NOTE — Therapy (Signed)
[place] [address] [place], [name], [address] Phone: [phone]   Fax:  [phone]  Patient Details  Name: [name] MRN: [id number] Date of Birth: 20-Jun-1940 Referring Provider:  Tonia Ghent, MD  Encounter Date: 03/25/2015   Occupational Therapy Parkinson's Disease Screen  Physical Performance Test item #4 (donning/doffing jacket):  12.56sec  9-hole peg test:    RUE  22.00sec        LUE  24.90sec  Box & Blocks Test:   RUE  62 blocks        LUE  53 blocks  Change in ability to perform ADLs/IADLs:  Pt reports incr R shoulder pain in last month 8-9/10 affecting use.  Pt reports pain with overhead reaching and reaching behind back.  Other Comments:  Pt reports that she has had some injuries which has affected exercise.  Pt would benefit from occupational therapy evaluation due to  Significant R shoulder pain affecting RUE functional use and to prevent complications related to shoulder pain.    [facility] 03/25/2015, 1:05 PM  [place] [address] [place], [name], [address] Phone: [phone]   Fax:  [place], OTR/L [facility] [address] [place], [place]  [address] [phone] phone [phone] 03/25/2015 1:55 PM

## 2015-03-25 NOTE — Therapy (Signed)
[place] [address] [place], [name], [address] Phone: [phone]   Fax:  [phone]  Patient Details  Name: [name] MRN: [id number] Date of Birth: Jun 17, 1940 Referring Provider:  Ludwig Clarks, DO  Encounter Date: 03/25/2015  Speech Therapy Parkinson's Disease Screen   Decibel Level today: 71dB  (WNL=70-72 dB) with sound level meter 30cm away from pt's mouth. Pt's conversational volume has remained WNL since last treatment course.  Pt has experienced difficulty in swallowing but she suspects this is more due to needing to have her "throat stretched." Pt's GI MD retired. She rec'd a letter from his practice saying she has been assigned a new MD. SLP strongly encouraged pt to initiate contact with the new MD taking retiring MD's patient load for assessment. Pt might well benefit from a modified barium swallow eval (MBSS) if GI MD reports upper esophagus is WNL.  Pt does does not require speech therapy services at this time. Recommend ST screen in another 4-6 months. By that time, pt will have initiated care with new GI MD and will have formulated a plan with him.   [facility] ,[place], [place]  03/25/2015, 1:35 PM  [place] [address] [place], [name], [address] Phone: [phone]   Fax:  [phone]

## 2015-03-26 ENCOUNTER — Telehealth: Payer: Self-pay

## 2015-03-26 ENCOUNTER — Other Ambulatory Visit: Payer: Self-pay | Admitting: Neurology

## 2015-03-26 MED ORDER — CARBIDOPA-LEVODOPA 25-100 MG PO TABS: 1.0000 | ORAL_TABLET | Freq: Three times a day (TID) | ORAL | Status: DC

## 2015-03-26 NOTE — Telephone Encounter (Signed)
Carbidopa Levodopa refill requested. Per last office note- patient to remain on medication. Refill approved and sent to patient's pharmacy.   

## 2015-03-26 NOTE — Telephone Encounter (Signed)
Pt left v/m; pt was seen 03/25/15 at neuro rehabilitation; pt was advised by speech and swallowing person that pt needed to have referral to see GI specialist; pt has had esophagus stretched in the past; pt has seen Dr Deatra Ina before but Dr Deatra Ina is not in and pt wants to know what GI doctor Dr [name] would recommend. Pt request cb.

## 2015-03-27 NOTE — Telephone Encounter (Signed)
I would go with [facility] old office, [facility].  I have confidence in all of the docs there. Let me know if she needs a referral.  Thanks.

## 2015-03-27 NOTE — Telephone Encounter (Signed)
Patient advised.

## 2015-03-31 ENCOUNTER — Ambulatory Visit: Payer: Medicare Other

## 2015-03-31 ENCOUNTER — Other Ambulatory Visit (HOSPITAL_BASED_OUTPATIENT_CLINIC_OR_DEPARTMENT_OTHER): Payer: Medicare Other

## 2015-03-31 DIAGNOSIS — D472 Monoclonal gammopathy: Secondary | ICD-10-CM | POA: Diagnosis not present

## 2015-03-31 DIAGNOSIS — C50911 Malignant neoplasm of unspecified site of right female breast: Secondary | ICD-10-CM

## 2015-03-31 LAB — CBC WITH DIFFERENTIAL/PLATELET
BASO%: 1.8 % (ref 0.0–2.0)
Basophils Absolute: 0.1 10*3/uL (ref 0.0–0.1)
EOS%: 6.1 % (ref 0.0–7.0)
Eosinophils Absolute: 0.4 10*3/uL (ref 0.0–0.5)
HCT: 36.3 % (ref 34.8–46.6)
HGB: 12 g/dL (ref 11.6–15.9)
LYMPH%: 35 % (ref 14.0–49.7)
MCH: 33.5 pg (ref 25.1–34.0)
MCHC: 33.1 g/dL (ref 31.5–36.0)
MCV: 101.1 fL — ABNORMAL HIGH (ref 79.5–101.0)
MONO#: 0.3 10*3/uL (ref 0.1–0.9)
MONO%: 4.5 % (ref 0.0–14.0)
NEUT#: 3.1 10*3/uL (ref 1.5–6.5)
NEUT%: 52.6 % (ref 38.4–76.8)
Platelets: 204 10*3/uL (ref 145–400)
RBC: 3.59 10*6/uL — ABNORMAL LOW (ref 3.70–5.45)
RDW: 14.1 % (ref 11.2–14.5)
WBC: 5.9 10*3/uL (ref 3.9–10.3)
lymph#: 2.1 10*3/uL (ref 0.9–3.3)

## 2015-03-31 LAB — COMPREHENSIVE METABOLIC PANEL
ALT: 18 U/L (ref 0–55)
AST: 26 U/L (ref 5–34)
Albumin: 3.5 g/dL (ref 3.5–5.0)
Alkaline Phosphatase: 36 U/L — ABNORMAL LOW (ref 40–150)
Anion Gap: 10 mEq/L (ref 3–11)
BUN: 14.8 mg/dL (ref 7.0–26.0)
CO2: 24 mEq/L (ref 22–29)
Calcium: 8.2 mg/dL — ABNORMAL LOW (ref 8.4–10.4)
Chloride: 109 mEq/L (ref 98–109)
Creatinine: 0.8 mg/dL (ref 0.6–1.1)
EGFR: 74 mL/min/{1.73_m2} — ABNORMAL LOW (ref >90)
Glucose: 118 mg/dl (ref 70–140)
Potassium: 3.6 mEq/L (ref 3.5–5.1)
Sodium: 142 mEq/L (ref 136–145)
Total Bilirubin: <0.3 mg/dL (ref 0.20–1.20)
Total Protein: 6.8 g/dL (ref 6.4–8.3)

## 2015-03-31 LAB — LACTATE DEHYDROGENASE: LDH: 161 U/L (ref 125–245)

## 2015-04-01 LAB — BETA 2 MICROGLOBULIN, SERUM: Beta-2: 1.4 mg/L (ref 0.6–2.4)

## 2015-04-01 LAB — CANCER ANTIGEN 27-29 (PARALLEL TESTING): CA 27.29: 14 U/mL (ref <38)

## 2015-04-01 LAB — KAPPA/LAMBDA LIGHT CHAINS
Ig Kappa Free Light Chain: 49.22 mg/L — ABNORMAL HIGH (ref 3.30–19.40)
Ig Lambda Free Light Chain: 7.7 mg/L (ref 5.71–26.30)
Kappa/Lambda FluidC Ratio: 6.39 — ABNORMAL HIGH (ref 0.26–1.65)

## 2015-04-01 LAB — IGG, IGA, IGM
IgA, Qn, Serum: 33 mg/dL — ABNORMAL LOW (ref 64–422)
IgG, Qn, Serum: 1485 mg/dL (ref 700–1600)
IgM, Qn, Serum: 26 mg/dL (ref 26–217)

## 2015-04-01 LAB — CANCER ANTIGEN 27.29: CA 27.29: 12.9 U/mL (ref 0.0–38.6)

## 2015-04-07 ENCOUNTER — Encounter: Payer: Self-pay | Admitting: Internal Medicine

## 2015-04-07 ENCOUNTER — Ambulatory Visit: Payer: Medicare Other

## 2015-04-07 ENCOUNTER — Telehealth: Payer: Self-pay | Admitting: Internal Medicine

## 2015-04-07 ENCOUNTER — Ambulatory Visit (HOSPITAL_BASED_OUTPATIENT_CLINIC_OR_DEPARTMENT_OTHER): Payer: Medicare Other

## 2015-04-07 ENCOUNTER — Ambulatory Visit (HOSPITAL_BASED_OUTPATIENT_CLINIC_OR_DEPARTMENT_OTHER): Payer: Medicare Other | Admitting: Internal Medicine

## 2015-04-07 VITALS — BP 125/64 | HR 79 | Temp 98.4°F | Resp 18 | Ht 65.0 in | Wt 123.2 lb

## 2015-04-07 DIAGNOSIS — C50911 Malignant neoplasm of unspecified site of right female breast: Secondary | ICD-10-CM

## 2015-04-07 DIAGNOSIS — Z79811 Long term (current) use of aromatase inhibitors: Secondary | ICD-10-CM

## 2015-04-07 DIAGNOSIS — C50011 Malignant neoplasm of nipple and areola, right female breast: Secondary | ICD-10-CM

## 2015-04-07 DIAGNOSIS — D472 Monoclonal gammopathy: Secondary | ICD-10-CM

## 2015-04-07 MED ORDER — DENOSUMAB 60 MG/ML ~~LOC~~ SOLN
60.0000 mg | Freq: Once | SUBCUTANEOUS | Status: AC
  Administered 2015-04-07: 60 mg via SUBCUTANEOUS
  Filled 2015-04-07: qty 1

## 2015-04-07 NOTE — Telephone Encounter (Signed)
per pof to sch pt appt-gave pt copy of avs °

## 2015-04-07 NOTE — Progress Notes (Signed)
[place] [phone]   [phone]  OFFICE PROGRESS NOTE  [name], MD [name] [address]  DIAGNOSIS:  1) Node-negative breast cancer status post completion of radiation 05/06/2010 currently on tamoxifen as well as prolia.  2) Monoclonal gammopathy of unknown significance on observation since 2007   PRIOR THERAPY:  1) status post right lumpectomy on 03/05/2010 and it showed invasive ductal carcinoma 0.9 CM, Positive for ER/PR and negative for HER-2, with ductal carcinoma in situ present and negative sentinel lymph node biopsies.  2) status post adjuvant radiotherapy completed in April of 2012   CURRENT THERAPY:  1) tamoxifen 20 mg by mouth daily started in 2012.  2) Prolia subcutaneous injection every 6 months.  INTERVAL HISTORY: [name] 75 y.o. female returns to the clinic today for 6 months followup visit. The patient has no significant complaints today except for intermittent right shoulder pain. She is scheduled to have physical therapy soon. She is tolerating her treatment with tamoxifen fairly well. She denied having any significant weight loss or night sweats. The patient has no nausea or vomiting. She denied having any significant chest pain, shortness of breath, cough or hemoptysis. She had repeat myeloma panel performed as well as CA 27.29 recently and she is here for evaluation and discussion of her lab results. Her last mammogram in January 2017 showed no evidence for disease recurrence.  MEDICAL HISTORY: Past Medical History  Diagnosis Date  . Esophageal Stricture   . Monoclonal gammopathy   . Anemia     Anemia-NOS / PMH, Dr Julien Nordmann  . Hyperlipidemia   . Vertical diplopia   . MVP (mitral valve prolapse)   . Breast cancer (HCC)     stage I right breast  . Skin cancer     basal cell L neck  . Allergy     SEASONAL  . Arthritis   . Cataract     BILATERAL  . Osteoporosis      ALLERGIES:  is allergic to penicillins.  MEDICATIONS:  Current Outpatient Prescriptions  Medication Sig Dispense Refill  . aspirin 81 MG tablet Take 81 mg by mouth daily.      [name] b complex vitamins tablet Take 1 tablet by mouth daily.    . Calcium 1500 MG tablet Take 1,500 mg by mouth.      . carbidopa-levodopa (SINEMET IR) 25-100 MG tablet Take 1 tablet by mouth 3 (three) times daily. 90 tablet 5  . cholecalciferol (VITAMIN D) 1000 UNITS tablet Take 5,000 Units by mouth daily.      . Denosumab (PROLIA [employer]) Inject into the skin every 6 (six) months.    . Multiple Vitamin (MULTIVITAMIN) capsule 2 tabs po qd     . nitroGLYCERIN (NITROSTAT) 0.3 MG SL tablet Place 0.3 mg under the tongue every 5 (five) minutes as needed.      . Omega-3 Fatty Acids (OMEGA 3 PO) 1 tab po qd     . tamoxifen (NOLVADEX) 20 MG tablet TAKE ONE TABLET BY MOUTH ONCE DAILY 30 tablet 0  . traMADol (ULTRAM) 50 MG tablet TAKE ONE TO TWO TABLETS BY MOUTH AT BEDTIME AS NEEDED FOR PAIN 60 tablet 5  . vitamin B-12 (CYANOCOBALAMIN) 500 MCG tablet Take 2500 mcg daily.     No current facility-administered medications for this visit.    SURGICAL HISTORY:  Past Surgical History  Procedure Laterality Date  . Total abdominal hysterectomy w/ bilateral salpingoophorectomy  75 yrs old    for pain,prolapse ; G3 P2  . Salpingoophorectomy  2002    for benign growths  . Hemorrhoid surgery  1970's  . Appendectomy  2002  . Cardiac catheterization  2005    neg  . Colonoscopy      negative   . Bone biopsy  11/06/10  . Breast lumpectomy  02/2010    right breast lumpectomy and sentinel node biopsy  . Upper gastrointestinal endoscopy      REVIEW OF SYSTEMS:  A comprehensive review of systems was negative except for: Musculoskeletal: positive for arthralgias   PHYSICAL EXAMINATION: General appearance: alert, cooperative and no distress Head: Normocephalic, without obvious abnormality, atraumatic Neck: no adenopathy, no JVD,  supple, symmetrical, trachea midline and thyroid not enlarged, symmetric, no tenderness/mass/nodules Lymph nodes: Cervical, supraclavicular, and axillary nodes normal. Resp: clear to auscultation bilaterally Cardio: regular rate and rhythm, S1, S2 normal, no murmur, click, rub or gallop GI: soft, non-tender; bowel sounds normal; no masses,  no organomegaly Extremities: extremities normal, atraumatic, no cyanosis or edema   ECOG PERFORMANCE STATUS: 0 - Asymptomatic  Blood pressure 125/64, pulse 79, temperature 98.4 F (36.9 C), temperature source Oral, resp. rate 18, height 5' 5"  (1.651 m), weight 123 lb 3.2 oz (55.883 kg), SpO2 99 %.  LABORATORY DATA: Lab Results  Component Value Date   WBC 5.9 03/31/2015   HGB 12.0 03/31/2015   HCT 36.3 03/31/2015   MCV 101.1* 03/31/2015   PLT 204 03/31/2015      Chemistry      Component Value Date/Time   NA 142 03/31/2015 1054   NA 142 06/15/2011 1327   K 3.6 03/31/2015 1054   K 4.2 06/15/2011 1327   CL 107 03/21/2012 1501   CL 108 06/15/2011 1327   CO2 24 03/31/2015 1054   CO2 26 06/15/2011 1327   BUN 14.8 03/31/2015 1054   BUN 9 06/15/2011 1327   CREATININE 0.8 03/31/2015 1054   CREATININE 0.85 06/15/2011 1327      Component Value Date/Time   CALCIUM 8.2* 03/31/2015 1054   CALCIUM 8.9 06/15/2011 1327   ALKPHOS 36* 03/31/2015 1054   ALKPHOS 44 06/15/2011 1327   AST 26 03/31/2015 1054   AST 25 06/15/2011 1327   ALT 18 03/31/2015 1054   ALT 14 06/15/2011 1327   BILITOT <0.30 03/31/2015 1054   BILITOT 0.3 06/15/2011 1327       RADIOGRAPHIC STUDIES: No results found.  ASSESSMENT AND PLAN: This is a very pleasant 75 years old white female with history of monoclonal gammopathy of undetermined significance and has been observation since 2007 in addition to diagnosis of node-negative breast cancer in 2012 and currently on treatment with tamoxifen as well as Prolia every 6 months. The myeloma panel performed recently showed no  evidence for disease progression.  For the MGUS, I recommended for her to continue on observation with repeat myeloma panel in 12 months. For the breast cancer, her tumor marker is within the normal range. I recommended for her to continue her treatment with tamoxifen as the patient is tolerating it well. She would have repeat CA 27.29 in 6 months. The patient will continue with her routine screening mammogram as a scheduled. She was advised to call immediately if she has any concerning symptoms in the interval.  The patient voices understanding of current disease status and treatment options and is in agreement with the current care plan.  All questions were answered. The patient knows to call the  clinic with any problems, questions or concerns. We can certainly see the patient much sooner if necessary.  Disclaimer: This note was dictated with voice recognition software. Similar sounding words can inadvertently be transcribed and may not be corrected upon review.

## 2015-04-08 ENCOUNTER — Ambulatory Visit: Payer: Medicare Other | Attending: Family Medicine | Admitting: Occupational Therapy

## 2015-04-08 DIAGNOSIS — R258 Other abnormal involuntary movements: Secondary | ICD-10-CM | POA: Diagnosis not present

## 2015-04-08 DIAGNOSIS — M25511 Pain in right shoulder: Secondary | ICD-10-CM | POA: Diagnosis not present

## 2015-04-08 DIAGNOSIS — R293 Abnormal posture: Secondary | ICD-10-CM | POA: Diagnosis not present

## 2015-04-08 DIAGNOSIS — M25611 Stiffness of right shoulder, not elsewhere classified: Secondary | ICD-10-CM

## 2015-04-08 DIAGNOSIS — R29898 Other symptoms and signs involving the musculoskeletal system: Secondary | ICD-10-CM | POA: Diagnosis not present

## 2015-04-08 DIAGNOSIS — R279 Unspecified lack of coordination: Secondary | ICD-10-CM | POA: Diagnosis not present

## 2015-04-08 DIAGNOSIS — R278 Other lack of coordination: Secondary | ICD-10-CM

## 2015-04-10 ENCOUNTER — Encounter: Payer: Self-pay | Admitting: Occupational Therapy

## 2015-04-10 NOTE — Therapy (Signed)
[facility] [place] [address] [place], [name], [address] Phone: [phone]   Fax:  [phone]  Occupational Therapy Evaluation  Patient Details  Name: [name] MRN: [id number] Date of Birth: [birth year] Referring Provider: Dr. Wells Guiles Tat  Encounter Date: 04/08/2015      OT End of Session - 04/10/15 0919    Visit Number 1   Number of Visits 17   Date for OT Re-Evaluation 06/07/15   Authorization Type Medicare, BCBS (g-code needed)   Authorization - Visit Number 1   Authorization - Number of Visits 10   OT Start Time 1535   OT Stop Time 1622   OT Time Calculation (min) 47 min   Activity Tolerance Patient tolerated treatment well   Behavior During Therapy [facility] for tasks assessed/performed      Past Medical History  Diagnosis Date  . Esophageal Stricture   . Monoclonal gammopathy   . Anemia     Anemia-NOS / PMH, Dr Julien Nordmann  . Hyperlipidemia   . Vertical diplopia   . MVP (mitral valve prolapse)   . Breast cancer (HCC)     stage I right breast  . Skin cancer     basal cell L neck  . Allergy     SEASONAL  . Arthritis   . Cataract     BILATERAL  . Osteoporosis     Past Surgical History  Procedure Laterality Date  . Total abdominal hysterectomy w/ bilateral salpingoophorectomy  75 yrs old    for pain,prolapse ; G3 P2  . Salpingoophorectomy  2002    for benign growths  . Hemorrhoid surgery  1970's  . Appendectomy  2002  . Cardiac catheterization  2005    neg  . Colonoscopy      negative   . Bone biopsy  11/06/10  . Breast lumpectomy  02/2010    right breast lumpectomy and sentinel node biopsy  . Upper gastrointestinal endoscopy      There were no vitals filed for this visit.  Visit Diagnosis:  Pain in joint of right shoulder  Stiffness of joint, shoulder region, right  Bradykinesia  Rigidity  Decreased coordination  Posture abnormality      Subjective Assessment - 04/10/15 1249     Subjective  Pt reports that pain started a few months ago (about when winter started)   Pertinent History hx of R breast CA with lumpectomy and lymph node removal and radiation 2012; multiple myeloma, osteoporesis   Patient Stated Goals improve R shoulder pain   Currently in Pain? Yes   Pain Score 7   5-8/10   Pain Location Shoulder   Pain Orientation Right;Posterior   Pain Descriptors / Indicators Aching;Sharp   Pain Type Chronic pain   Pain Onset More than a month ago   Pain Frequency Constant   Aggravating Factors  use, lying on it   Pain Relieving Factors rest            [facility] OT Assessment - 04/10/15 0001    Assessment   Diagnosis Parkinsons disease, R shoulder pain   Referring Provider Dr. Wells Guiles Tat   Onset Date --  2015 PD dx; OT screen 03/25/15 R shoulder pain   Assessment OT screen 03/25/15 indicated need for OT   Prior Therapy 08/01/14 d/c from OT   Precautions   Precautions None   Balance Screen   Has the patient fallen in the past 6 months No   Prior Function   Level  of Independence Independent with basic ADLs;Independent with homemaking with ambulation   Leisure Pt goes to gym to walk on track/treadmill 6x/week    ADL   Eating/Feeding Modified independent  difficulty opening containers, bottles, packages   Grooming Modified independent   Upper Body Bathing Modified independent  pain in R shoulder    Lower Body Bathing Modified independent   Upper Body Dressing --  difficulty getting shirt over head, donning bra, pain   Lower Body Dressing Modified independent  sitting down   Toilet Tranfer Modified independent  min difficulty, R shoulder pain when pushing up   Toileting - Clothing Manipulation Modified independent   Toileting -  Hygiene Modified Independent   Tub/Shower Transfer --  min difficulty   Transfers/Ambulation Related to ADL's pt reports difficulty with turns and feeling "drunk"/off balance   ADL comments using LUE more due to R shoulder  pain   IADL   Prior Level of Function Shopping mod I, pain with carrying groceries   Prior Level of Function Light Housekeeping completing with pain, uses wood to heat house    Prior Level of Function Meal Prep Mod I, but doesn't cook much due to living alone   [employer] own vehicle   Medication Management Is responsible for taking medication in correct dosages at correct time   Mobility   Mobility Status Independent  reports difficulty/feeling "drunk" with turns   Written Expression   Dominant Hand Right   Handwriting --  not assessed due to time constraints   Cognition   Overall Cognitive Status Within Functional Limits for tasks assessed  pt reports mild difficulty w/ divided attention-select tasks   Observation/Other Assessments   Observations Posture:  pt with mild forward lean and rounded shoulders   Standing Functional Reach Test R-8" with pain, L-8"    Other Surveys  Select   Physical Performance Test   Yes   Simulated Eating Time (seconds) not assessed due to time constraints   Donning Doffing Jacket Time (seconds) 14.25   Donning Doffing Jacket Comments buttoning/unbuttoning 3 buttons on table in 20.25sec   Coordination   9 Hole Peg Test Right;Left   Right 9 Hole Peg Test 25.78   Left 9 Hole Peg Test 27.44   Box and Blocks R-51 blocks, L-51blocks   Tone   Assessment Location Right Upper Extremity;Left Upper Extremity   ROM / Strength   AROM / PROM / Strength AROM   Palpation   Palpation comment Pt reports mild point tenderness dorsal R shoulder    AROM   Overall AROM  Deficits   Overall AROM Comments grossly WNL for BUEs except R shoulder   AROM Assessment Site Shoulder   Right/Left Shoulder Right   Right Shoulder Flexion 90 Degrees  limited by pain   Right Shoulder ABduction 70 Degrees  limited by pain   Right Shoulder Internal Rotation --  approx 50%   Right Shoulder External Rotation --  approx 50%   RUE Tone   RUE Tone Mild  difficult  to assess shoulder due to pain, ?if incr   LUE Tone   LUE Tone --  no significant rigidity                         OT Education - 04/10/15 0901    Education provided Yes   Education Details Avoid heavy activity with RUE, push intead of pulling to help with sit>stand if needed, use BSC over toilet to  prevent additional stress with UEs to help with transfer, POC, importance of associated movements with UE movement to prevent pain/injuries   Person(s) Educated Patient   Methods Explanation   Comprehension Verbalized understanding          OT Short Term Goals - 04/10/15 0920    OT SHORT TERM GOAL #1   Title Pt will be independent with initial HEP--check STGs 05/08/15   Time 4   Period Weeks   Status New   OT SHORT TERM GOAL #2   Title Pt will demo at least 105* R shoulder flex with 3/10 pain or less.   Baseline 90* with 6/10 pain   Time 4   Period Weeks   Status New   OT SHORT TERM GOAL #3   Title Pt will report RUE pain less than or equal to 5/10 for ADLs.   Time 4   Period Weeks   Status New   OT SHORT TERM GOAL #4   Title Pt will demo at least 85* R shoulder abduction for ADLs.   Baseline 70*   Time 4   Period Weeks   Status New           OT Long Term Goals - 04/10/15 0737    OT LONG TERM GOAL #1   Title Pt will verbalize understanding of updated strategies to increase ease, safety, and independence and decr pain with ADLs/IADLs prn.--check LTGs 06/07/15   Time 8   Period Weeks   Status New   OT LONG TERM GOAL #2   Title Pt will improve LUE functional reaching/coordination as shown by improving time on box and blocks test by at least 4.   Baseline R-51 blocks, L-51 blocks   Status New   OT LONG TERM GOAL #3   Title Pt will improve balance for ADLs/IADLs as shown by improving standing functional reach test by 2 inches bilaterally.   Baseline 8 inches bilaterally   Time 8   Period Weeks   Status New   OT LONG TERM GOAL #4   Title Pt will  improve RUE functional reaching/coordination as shown by improving time on box and blocks test by at least 5.   Baseline R-51 blocks, L-51 blocks   Time 8   Period Weeks   Status New   OT LONG TERM GOAL #5   Title Pt will demo at least 120* R shoulder flex with 3/10 pain or less.   Baseline 90* with 6/10 pain   Time 8   Period Weeks   Status New   Long Term Additional Goals   Additional Long Term Goals Yes   OT LONG TERM GOAL #6   Title Pt will demo at least 100* R shoulder abduction for ADLs.   Baseline 70* with pain   Time 8   Period Weeks   Status New               Plan - 04/10/15 1213    Clinical Impression Statement Pt is a 75 y.o. female with Parkinson's disease and R shoulder pain.  Pt with PMH that includes hx of R breast CA with lymph node removal and radiation, multiple myeloma, and osteoporosis.  Pt presents with R shoulder pain limiting RUE functional use, decr ROM, bradykinesia, abnomal posture, mild rigidity, mild decr coordination.  Pt would benefit from occupational therapy to address these deficits in order to improve RUE functional use, ADL/IADLs performance/ease, and to prevent future complications.     Pt will  benefit from skilled therapeutic intervention in order to improve on the following deficits (Retired) Pain;Impaired UE functional use;Decreased knowledge of use of DME;Decreased activity tolerance;Impaired tone;Decreased mobility;Decreased strength;Decreased coordination;Decreased range of motion;Improper body mechanics  bradykinesia   Rehab Potential Good   Clinical Impairments Affecting Rehab Potential R shoulder pain   OT Frequency 2x / week   OT Duration 8 weeks  +eval   OT Treatment/Interventions Moist Heat;Passive range of motion;Electrical Stimulation;Therapeutic activities;DME and/or AE instruction;Cryotherapy;Therapeutic exercises;Neuromuscular education;Ultrasound;Self-care/ADL training;Therapeutic exercise;Functional Mobility  Training;Patient/family education;Manual Therapy   Plan supine cane ex, focus on posture   Consulted and Agree with Plan of Care Patient        Problem List Patient Active Problem List   Diagnosis Date Noted  . Foot pain 02/25/2015  . Skin tear of forearm without complication 97/02/6376  . Skin lesion 12/10/2014  . Muscle pain 07/20/2014  . Shoulder pain 04/17/2014  . PD (Parkinson's disease) ([place]) 06/05/2013  . Tremor 05/18/2013  . Irritation of ear 05/18/2013  . Medicare annual wellness visit, subsequent 05/02/2012  . Neck pain 05/02/2012  . Osteoporosis 03/08/2011  . Neck mass 03/08/2011  . Breast cancer ([place]) 11/19/2010  . DYSPEPSIA&OTHER SPEC DISORDERS FUNCTION STOMACH 08/01/2008  . DYSPHAGIA UNSPECIFIED 08/01/2008  . ORTHOSTATIC HYPOTENSION 06/28/2008  . Goiter 07/10/2007  . HLD (hyperlipidemia) 07/10/2007  . MONOCLONAL GAMMOPATHY 07/10/2007  . ANEMIA-NOS 07/10/2007  . FATIGUE 07/10/2007    [facility] 04/10/2015, 12:50 PM  [place] [address] [place], [name], [address] Phone: [phone]   Fax:  [phone]  Name: [name] MRN: [id number] Date of Birth: 27-Jan-1940  Vianne Bulls, OTR/L [facility] [address] [place], [place]  [address] [phone] phone [phone] 04/10/2015 12:50 PM

## 2015-04-14 DIAGNOSIS — C50911 Malignant neoplasm of unspecified site of right female breast: Secondary | ICD-10-CM | POA: Diagnosis not present

## 2015-04-15 ENCOUNTER — Ambulatory Visit: Payer: Medicare Other | Admitting: Occupational Therapy

## 2015-04-15 ENCOUNTER — Encounter: Payer: Self-pay | Admitting: Neurology

## 2015-04-15 ENCOUNTER — Ambulatory Visit (INDEPENDENT_AMBULATORY_CARE_PROVIDER_SITE_OTHER): Payer: Medicare Other | Admitting: Neurology

## 2015-04-15 ENCOUNTER — Ambulatory Visit: Payer: Medicare Other | Admitting: Neurology

## 2015-04-15 ENCOUNTER — Other Ambulatory Visit (HOSPITAL_COMMUNITY): Payer: Self-pay | Admitting: Neurology

## 2015-04-15 VITALS — BP 110/70 | HR 84 | Ht 65.0 in | Wt 122.0 lb

## 2015-04-15 DIAGNOSIS — G20 Parkinson's disease: Secondary | ICD-10-CM

## 2015-04-15 DIAGNOSIS — M25611 Stiffness of right shoulder, not elsewhere classified: Secondary | ICD-10-CM | POA: Diagnosis not present

## 2015-04-15 DIAGNOSIS — F458 Other somatoform disorders: Secondary | ICD-10-CM

## 2015-04-15 DIAGNOSIS — R293 Abnormal posture: Secondary | ICD-10-CM

## 2015-04-15 DIAGNOSIS — R278 Other lack of coordination: Secondary | ICD-10-CM

## 2015-04-15 DIAGNOSIS — R258 Other abnormal involuntary movements: Secondary | ICD-10-CM

## 2015-04-15 DIAGNOSIS — R279 Unspecified lack of coordination: Secondary | ICD-10-CM | POA: Diagnosis not present

## 2015-04-15 DIAGNOSIS — M25511 Pain in right shoulder: Secondary | ICD-10-CM

## 2015-04-15 DIAGNOSIS — F482 Pseudobulbar affect: Secondary | ICD-10-CM | POA: Diagnosis not present

## 2015-04-15 DIAGNOSIS — R1319 Other dysphagia: Secondary | ICD-10-CM

## 2015-04-15 DIAGNOSIS — R29898 Other symptoms and signs involving the musculoskeletal system: Secondary | ICD-10-CM

## 2015-04-15 DIAGNOSIS — R1310 Dysphagia, unspecified: Secondary | ICD-10-CM

## 2015-04-15 MED ORDER — DEXTROMETHORPHAN-QUINIDINE 20-10 MG PO CAPS: ORAL_CAPSULE | ORAL | Status: DC

## 2015-04-15 NOTE — Patient Instructions (Addendum)
1. Start Nuedexta - take one tablet daily for 7 days, then take twice daily. Samples given. Let us know how you are doing on the medication and we will send in a prescription if needed.  2. We have scheduled you at [facility] for your modified barium swallow on 04/21/15 at 11:30 am. Please arrive 15 minutes prior and go to 1st floor radiology. If you need to reschedule for any reason please call [phone].

## 2015-04-15 NOTE — Therapy (Signed)
[facility] [address] [place], [name], [address] Phone: [phone]   Fax:  [phone]  Occupational Therapy Treatment  Patient Details  Name: [name] MRN: [id number] Date of Birth: May 06, 1940 Referring Provider: Dr. Wells Guiles Tat  Encounter Date: 04/15/2015      OT End of Session - 04/15/15 1037    Visit Number 2   Number of Visits 17   Date for OT Re-Evaluation 06/07/15   Authorization Type Medicare, BCBS (g-code needed)   Authorization - Visit Number 2   Authorization - Number of Visits 10   OT Start Time 1021   OT Stop Time 1100   OT Time Calculation (min) 39 min   Activity Tolerance Patient tolerated treatment well   Behavior During Therapy [facility] for tasks assessed/performed      Past Medical History  Diagnosis Date  . Esophageal Stricture   . Monoclonal gammopathy   . Anemia     Anemia-NOS / PMH, Dr Julien Nordmann  . Hyperlipidemia   . Vertical diplopia   . MVP (mitral valve prolapse)   . Breast cancer (HCC)     stage I right breast  . Skin cancer     basal cell L neck  . Allergy     SEASONAL  . Arthritis   . Cataract     BILATERAL  . Osteoporosis     Past Surgical History  Procedure Laterality Date  . Total abdominal hysterectomy w/ bilateral salpingoophorectomy  75 yrs old    for pain,prolapse ; G3 P2  . Salpingoophorectomy  2002    for benign growths  . Hemorrhoid surgery  1970's  . Appendectomy  2002  . Cardiac catheterization  2005    neg  . Colonoscopy      negative   . Bone biopsy  11/06/10  . Breast lumpectomy  02/2010    right breast lumpectomy and sentinel node biopsy  . Upper gastrointestinal endoscopy      There were no vitals filed for this visit.  Visit Diagnosis:  Pain in joint of right shoulder  Stiffness of joint, shoulder region, right  Bradykinesia  Rigidity  Decreased coordination  Posture abnormality      Subjective Assessment - 04/15/15 1026     Subjective  Pt reports that she went to her surgeon (that did lumpectomy) and he said no CA, but muscles tight    Pertinent History hx of R breast CA with lumpectomy and lymph node removal and radiation 2012; multiple myeloma, osteoporesis   Patient Stated Goals improve R shoulder pain   Currently in Pain? Yes   Pain Score 3    Pain Location Shoulder   Pain Orientation Right   Pain Descriptors / Indicators Dull;Sore   Pain Type Chronic pain   Pain Onset More than a month ago   Pain Frequency Constant   Aggravating Factors  use   Pain Relieving Factors rest        Treatment:     PWR! Up in supine and sitting, PWR! Twist in supine (low range), PWR! Twist with RUE in prone with min cueing for positioning and large amplitude as able  Shoulder flex and chest press in supine with ball with min cueing  Shoulder flex AAROM in sidelying with min cues  Shoulder flex/horizontal abduction table slides in sitting with min cueing initially  Shoulder flex AAROM with hemiglide as with min cues  Instructed pt in importance of trunk associated movements with reach to decr  risk of injury and decr stiffness.                      OT Education - 04/15/15 1928    Education Details Initial HEP--see pt instructions + PWR! up in supine and seated 2x/day within pt tolerance (goal of 3/10 or less pain); Bed positioning to decr pain with RUE; Good body mechanics for lifting   Person(s) Educated Patient   Methods Explanation;Demonstration;Verbal cues;Handout   Comprehension Verbalized understanding;Returned demonstration          OT Short Term Goals - 04/10/15 0920    OT SHORT TERM GOAL #1   Title Pt will be independent with initial HEP--check STGs 05/08/15   Time 4   Period Weeks   Status New   OT SHORT TERM GOAL #2   Title Pt will demo at least 105* R shoulder flex with 3/10 pain or less.   Baseline 90* with 6/10 pain   Time 4   Period Weeks   Status New   OT SHORT TERM  GOAL #3   Title Pt will report RUE pain less than or equal to 5/10 for ADLs.   Time 4   Period Weeks   Status New   OT SHORT TERM GOAL #4   Title Pt will demo at least 85* R shoulder abduction for ADLs.   Baseline 70*   Time 4   Period Weeks   Status New           OT Long Term Goals - 04/10/15 3875    OT LONG TERM GOAL #1   Title Pt will verbalize understanding of updated strategies to increase ease, safety, and independence and decr pain with ADLs/IADLs prn.--check LTGs 06/07/15   Time 8   Period Weeks   Status New   OT LONG TERM GOAL #2   Title Pt will improve LUE functional reaching/coordination as shown by improving time on box and blocks test by at least 4.   Baseline R-51 blocks, L-51 blocks   Status New   OT LONG TERM GOAL #3   Title Pt will improve balance for ADLs/IADLs as shown by improving standing functional reach test by 2 inches bilaterally.   Baseline 8 inches bilaterally   Time 8   Period Weeks   Status New   OT LONG TERM GOAL #4   Title Pt will improve RUE functional reaching/coordination as shown by improving time on box and blocks test by at least 5.   Baseline R-51 blocks, L-51 blocks   Time 8   Period Weeks   Status New   OT LONG TERM GOAL #5   Title Pt will demo at least 120* R shoulder flex with 3/10 pain or less.   Baseline 90* with 6/10 pain   Time 8   Period Weeks   Status New   Long Term Additional Goals   Additional Long Term Goals Yes   OT LONG TERM GOAL #6   Title Pt will demo at least 100* R shoulder abduction for ADLs.   Baseline 70* with pain   Time 8   Period Weeks   Status New               Plan - 04/15/15 1931    Clinical Impression Statement Pt verbalized understanding of initial HEP.  Pt reports no significant incr in pain during HEP.   Plan continue with ROM and trunk movement/posture as tolerated   Consulted and Agree with Plan  of Care Patient        Problem List Patient Active Problem List   Diagnosis  Date Noted  . Foot pain 02/25/2015  . Skin tear of forearm without complication 65/79/0383  . Skin lesion 12/10/2014  . Muscle pain 07/20/2014  . Shoulder pain 04/17/2014  . PD (Parkinson's disease) ([place]) 06/05/2013  . Tremor 05/18/2013  . Irritation of ear 05/18/2013  . Medicare annual wellness visit, subsequent 05/02/2012  . Neck pain 05/02/2012  . Osteoporosis 03/08/2011  . Neck mass 03/08/2011  . Breast cancer ([place]) 11/19/2010  . DYSPEPSIA&OTHER SPEC DISORDERS FUNCTION STOMACH 08/01/2008  . DYSPHAGIA UNSPECIFIED 08/01/2008  . ORTHOSTATIC HYPOTENSION 06/28/2008  . Goiter 07/10/2007  . HLD (hyperlipidemia) 07/10/2007  . MONOCLONAL GAMMOPATHY 07/10/2007  . ANEMIA-NOS 07/10/2007  . FATIGUE 07/10/2007    [facility] 04/15/2015, 7:40 PM  [facility] [address] [place], [name], [address] Phone: [phone]   Fax:  [phone]  Name: RUPINDER LIVINGSTON MRN: [id number] Date of Birth: 17-Mar-1940  Vianne Bulls, OTR/L [facility] [address] [place], [place]  [address] [phone] phone [phone] 04/15/2015 7:40 PM

## 2015-04-15 NOTE — Patient Instructions (Signed)
   1.  Lay down on your back.  Hold ball/pillow in both hands (hands on the side), slowly raise arms as far as you can keeping elbows straight.  x15  2.  Then hold ball in both hands and push ball to ceiling to straighten elbows.  Then, slowly bring balls toward your chest.  3.  Flexion (Passive)    Sitting upright, slide forearm forward along table, bending from the waist until a stretch is felt. Hold 3-5seconds. Repeat 15times. Do 2 sessions per day.  The stretch arm to the right side and back to middle x15.

## 2015-04-15 NOTE — Progress Notes (Signed)
[name] was seen today in the movement disorders clinic for neurologic consultation at the request of [name], MD.  The consultation is for the evaluation of tremor.  The records that were made available to me were reviewed.  No one accompanies the patient.   The first symptom(s) the patient noticed was unilateral hand tremor on the L and this was 4-5 months ago.  She is R hand dominant.  She states that it was intermittent when it started but now it is more constant.  No tremor elsewhere.    Only living relative with PD is a cousin, who is currently being treated at [facility].  08/17/13 update:  Pt states that tremor is improved on levodopa but not sure if it is otherwise helping (but may be).  Takes med at 8am/1pm/5-7pm.  No SE.  Blood pressure has been low but states that it is always low.  No lightheadedness.  Balance is not good, but no falls.  She completed therapy.  Is exercising at home.  She walks an hr a day 6 days per week and when she goes to the gym, she will use the treadmill.  Has signed up for PD exercise class.   11/20/13 update:  Pt f/u re: PD.  Is on carbidopa/levodopa 25/100 tid.  Is doing well.  No falls.  No lightheadedness.  No hallucinations.  Exercising faithfully.   Is having an aching pain in the shoulder and in the thumb region on the R side but PD primarily affects her on the L.  Some occasional tremor on the L.    04/10/14 update:  Pt is on carbidopa/levodopa 25/100 tid.  She is stable in regards to PD but unfortunately her husband just died 72 days ago.  She just started back exercising with her sister in law.  No SI/HI.  No falls.  No lightheadeness.  Some tremor but no more than usual.  08/12/14 update:  Pt is on carbidopa/levodopa 25/100 three times per day.  She has done PT/OT/ST since our last visit and just completed her last therapy session on 08/01/14.  She had an injection in her shoulder as well and then had therapy for that and it is feeling better.   No falls since our last visit.  No lightheadedness.  She is exercising faithfully.  She is walking for exercise 6 times a week.  She admits that she is crying a lot and states that it started before her husband died and sometimes it is crying over inconsequential stuff.  Sometimes, she cries over her husbands death but doesn't want medication for that.  She is not SI/HI.  12/16/14 update:  Pt is following up today and is still on carbidopa/levodopa 25/100 tid.   No significant changes since last visit.  She goes to the gym 6 days a week.  Sometimes, she will go two times a day.   She is going to be spending thanksgiving with her son.    Wearing off:  No.  How long before next dose:  n/a Falls:   No. N/V:  No. (had some nausea this past weekend but thinks related to food she ate) Hallucinations:  No.  visual distortions: No. Lightheaded:  Yes.  , just last few days  Syncope: No. Dyskinesia:  No.   04/15/15 update:  The patient presents today for follow-up.  She has a history of Parkinson's disease.  She is currently on carbidopa/levodopa 25/100, 3 times per day.  Overall, she reports  that she is doing well.  She denies any falls.  No hallucinations.  No lightheadedness or near syncope.  She is still exercising.  She started occupational therapy for her shoulder pain.  I saw that speech therapy recommended that she see GI because she had her esophagus structure in the past.  She has an appointment with [name] on March 30.  She states that she is having a little problem swallowing but "it isn't bad yet."  States that she is crying for unknown reason (watching the news, etc).  Doesn't think that it is depression.   Neuroimaging has not previously been performed.  It was supposed to be done 06/01/13 but pt cancelled as she was claustrophobic and couldn't do the scan even though it was in an open unit.   PREVIOUS MEDICATIONS: none to date  ALLERGIES:   Allergies  Allergen Reactions  . Penicillins  Shortness Of Breath and Itching    CURRENT MEDICATIONS:  Current Outpatient Prescriptions on File Prior to Visit  Medication Sig Dispense Refill  . aspirin 81 MG tablet Take 81 mg by mouth daily.      [name] b complex vitamins tablet Take 1 tablet by mouth daily.    . Calcium 1500 MG tablet Take 1,500 mg by mouth.      . carbidopa-levodopa (SINEMET IR) 25-100 MG tablet Take 1 tablet by mouth 3 (three) times daily. 90 tablet 5  . cholecalciferol (VITAMIN D) 1000 UNITS tablet Take 5,000 Units by mouth daily.      . Denosumab (PROLIA [place]) Inject into the skin every 6 (six) months.    . Multiple Vitamin (MULTIVITAMIN) capsule 2 tabs po qd     . nitroGLYCERIN (NITROSTAT) 0.3 MG SL tablet Place 0.3 mg under the tongue every 5 (five) minutes as needed. Reported on 04/07/2015    . Omega-3 Fatty Acids (OMEGA 3 PO) 1 tab po qd     . tamoxifen (NOLVADEX) 20 MG tablet TAKE ONE TABLET BY MOUTH ONCE DAILY 30 tablet 0  . traMADol (ULTRAM) 50 MG tablet TAKE ONE TO TWO TABLETS BY MOUTH AT BEDTIME AS NEEDED FOR PAIN 60 tablet 5  . vitamin B-12 (CYANOCOBALAMIN) 500 MCG tablet Take 2500 mcg daily.     No current facility-administered medications on file prior to visit.    PAST MEDICAL HISTORY:   Past Medical History  Diagnosis Date  . Esophageal Stricture   . Monoclonal gammopathy   . Anemia     Anemia-NOS / PMH, Dr Julien Nordmann  . Hyperlipidemia   . Vertical diplopia   . MVP (mitral valve prolapse)   . Breast cancer (HCC)     stage I right breast  . Skin cancer     basal cell L neck  . Allergy     SEASONAL  . Arthritis   . Cataract     BILATERAL  . Osteoporosis     PAST SURGICAL HISTORY:   Past Surgical History  Procedure Laterality Date  . Total abdominal hysterectomy w/ bilateral salpingoophorectomy  75 yrs old    for pain,prolapse ; G3 P2  . Salpingoophorectomy  2002    for benign growths  . Hemorrhoid surgery  1970's  . Appendectomy  2002  . Cardiac catheterization  2005    neg  .  Colonoscopy      negative   . Bone biopsy  11/06/10  . Breast lumpectomy  02/2010    right breast lumpectomy and sentinel node biopsy  . Upper gastrointestinal  endoscopy      SOCIAL HISTORY:   Social History   Social History  . Marital Status: Widowed    Spouse Name: N/A  . Number of Children: 2  . Years of Education: N/A   Occupational History  . Retired    Social History Main Topics  . Smoking status: Never Smoker   . Smokeless tobacco: Never Used  . Alcohol Use: No  . Drug Use: No  . Sexual Activity: Not on file   Other Topics Concern  . Not on file   Social History Narrative   Occupation: Textile   Widowed 2016, husband had cancer, was married in 1959    Patient has never smoked.    Alcohol Use - no   Illicit Drug Use - no   Patient does not get regular exercise.    Daily Caffeine Use: 3 cups of coffee daily    FAMILY HISTORY:   Family Status  Relation Status Death Age  . Mother Deceased     heart disease  . Brother Alive     diabetes  . Brother Deceased     heart disease, renal failure  . Father Deceased     hepatitis  . Sister Alive     diabetes  . Brother Deceased     drowned as a teenager  . Brother Alive     diabetes  . Brother Alive     diabetes  . Sister Alive     diabetes, liver disease (alcohol)  . Sister Alive     healthy  . Son Alive     sarcoidosis  . Daughter Alive     healthy  . Cousin Alive     Parkinsons disease    ROS:  A complete 10 system review of systems was obtained and was unremarkable apart from what is mentioned above.  PHYSICAL EXAMINATION:    VITALS:   Filed Vitals:   04/15/15 1405  BP: 110/70  Pulse: 84  Height: 5\' 5"  (1.651 m)  Weight: 122 lb (55.339 kg)    GEN:  The patient appears stated age and is in NAD.   HEENT:  Normocephalic, atraumatic.  The mucous membranes are moist. The superficial temporal arteries are without ropiness or tenderness. CV:  RRR Lungs:  CTAB Neck:  No  bruits   Neurological examination:  Orientation: The patient is alert and oriented x3.  Cranial nerves: There is good facial symmetry. There is facial hypomimia. The speech is fluent and clear. Soft palate rises symmetrically and there is no tongue deviation. Hearing is intact to conversational tone. Sensation: Sensation is intact to light touch throughout Motor: Strength is 5/5 in the bilateral upper and lower extremities.   Shoulder shrug is equal and symmetric.  There is no pronator drift. Deep tendon reflexes: Deep tendon reflexes are 3/4 at the bilateral biceps, triceps, brachioradialis, patella and achilles. Plantar responses are downgoing bilaterally.  Movement examination: Tone: There is normal tone in the bilateral upper extremities Abnormal movements: There is LUE tremor, that is very rare Coordination:  There is good RAM's today Gait and Station: The patient has no difficulty arising out of a deep-seated chair without the use of the hands.  The patient's stride length is normal with slight decrease in arm swing on the L.    ASSESSMENT/PLAN:  1.  Idiopathic Parkinson's disease.  She was dx in May, 2015.    -She is doing well on carbidopa/levodopa 25/100 tid.    -encouraged continue to exercise.  Pt education provided.   2.  Situational depression with superimposed PBA  -husband just died a year ago and states that no longer feels depressed but feels that this is PBA.  Start neudexta.  Risks, benefits, side effects and alternative therapies were discussed.  The opportunity to ask questions was given and they were answered to the best of my ability.  The patient expressed understanding and willingness to follow the outlined treatment protocols.  Samples given.   3.  Dysphagia  -has appt with [name] on 3/30.  Hx of having esophagus stretched  -will order MBE as commonly a PD issue 4.  Follow up is anticipated in the next few months, sooner should new neurologic issues arise.   Much greater than 50% of this visit was spent in counseling with the patient and the family.  Total face to face time:  25 min

## 2015-04-16 ENCOUNTER — Ambulatory Visit: Payer: Medicare Other | Admitting: Occupational Therapy

## 2015-04-16 DIAGNOSIS — M25511 Pain in right shoulder: Secondary | ICD-10-CM

## 2015-04-16 DIAGNOSIS — R279 Unspecified lack of coordination: Secondary | ICD-10-CM | POA: Diagnosis not present

## 2015-04-16 DIAGNOSIS — M25611 Stiffness of right shoulder, not elsewhere classified: Secondary | ICD-10-CM

## 2015-04-16 DIAGNOSIS — R293 Abnormal posture: Secondary | ICD-10-CM | POA: Diagnosis not present

## 2015-04-16 DIAGNOSIS — R278 Other lack of coordination: Secondary | ICD-10-CM

## 2015-04-16 DIAGNOSIS — R29898 Other symptoms and signs involving the musculoskeletal system: Secondary | ICD-10-CM | POA: Diagnosis not present

## 2015-04-16 DIAGNOSIS — R258 Other abnormal involuntary movements: Secondary | ICD-10-CM

## 2015-04-16 NOTE — Therapy (Signed)
[facility] [address] [place], [name], [address] Phone: [phone]   Fax:  [phone]  Occupational Therapy Treatment  Patient Details  Name: [name] MRN: [id number] Date of Birth: [birth year] Referring Provider: Dr. Wells Guiles Tat  Encounter Date: 04/16/2015      OT End of Session - 04/17/15 1340    Visit Number 3   Number of Visits 17   Date for OT Re-Evaluation 06/07/15   Authorization Type Medicare, BCBS (g-code needed)   Authorization - Visit Number 3   Authorization - Number of Visits 10   OT Start Time 0935   OT Stop Time 1015   OT Time Calculation (min) 40 min   Activity Tolerance Patient tolerated treatment well   Behavior During Therapy [facility] for tasks assessed/performed      Past Medical History  Diagnosis Date  . Esophageal Stricture   . Monoclonal gammopathy   . Anemia     Anemia-NOS / PMH, Dr Julien Nordmann  . Hyperlipidemia   . Vertical diplopia   . MVP (mitral valve prolapse)   . Breast cancer (HCC)     stage I right breast  . Skin cancer     basal cell L neck  . Allergy     SEASONAL  . Arthritis   . Cataract     BILATERAL  . Osteoporosis     Past Surgical History  Procedure Laterality Date  . Total abdominal hysterectomy w/ bilateral salpingoophorectomy  75 yrs old    for pain,prolapse ; G3 P2  . Salpingoophorectomy  2002    for benign growths  . Hemorrhoid surgery  1970's  . Appendectomy  2002  . Cardiac catheterization  2005    neg  . Colonoscopy      negative   . Bone biopsy  11/06/10  . Breast lumpectomy  02/2010    right breast lumpectomy and sentinel node biopsy  . Upper gastrointestinal endoscopy      There were no vitals filed for this visit.  Visit Diagnosis:  Pain in joint of right shoulder  Stiffness of joint, shoulder region, right  Bradykinesia  Rigidity  Decreased coordination  Posture abnormality      Subjective Assessment - 04/16/15 0934     Subjective  Reports shoulder pain   Pertinent History hx of R breast CA with lumpectomy and lymph node removal and radiation 2012; multiple myeloma, osteoporesis   Patient Stated Goals improve R shoulder pain   Currently in Pain? Yes   Pain Score 5    Pain Location Shoulder   Pain Orientation Right   Pain Descriptors / Indicators Aching   Pain Type Chronic pain   Pain Onset More than a month ago   Pain Frequency Constant   Aggravating Factors  use   Pain Relieving Factors rest   Multiple Pain Sites No            Treatment:      PWR! Up in supine and sitting, PWR! Twist in supine (low range), PWR! Rock in supine as tolerated low range  Shoulder flex AAROM in sidelying with min cues  Shoulder flex/horizontal abduction table slides in sitting with min cueing initially  Shoulder flex AAROM with hemiglide as with min cues, and shoulder abduction, however pain with shoulder abduction so this was discontinued  Shoulder flex with cane x 10 reps  Discussed proper shoulder positioning to minimize pain  Trunk lateral flexion/ weight shift with arms in external rotation, then  body rotated to each side for lateral trunk flexion/ extension                       OT Short Term Goals - 04/10/15 0920    OT SHORT TERM GOAL #1   Title Pt will be independent with initial HEP--check STGs 05/08/15   Time 4   Period Weeks   Status New   OT SHORT TERM GOAL #2   Title Pt will demo at least 105* R shoulder flex with 3/10 pain or less.   Baseline 90* with 6/10 pain   Time 4   Period Weeks   Status New   OT SHORT TERM GOAL #3   Title Pt will report RUE pain less than or equal to 5/10 for ADLs.   Time 4   Period Weeks   Status New   OT SHORT TERM GOAL #4   Title Pt will demo at least 85* R shoulder abduction for ADLs.   Baseline 70*   Time 4   Period Weeks   Status New           OT Long Term Goals - 04/10/15 5885    OT LONG TERM GOAL #1   Title Pt will  verbalize understanding of updated strategies to increase ease, safety, and independence and decr pain with ADLs/IADLs prn.--check LTGs 06/07/15   Time 8   Period Weeks   Status New   OT LONG TERM GOAL #2   Title Pt will improve LUE functional reaching/coordination as shown by improving time on box and blocks test by at least 4.   Baseline R-51 blocks, L-51 blocks   Status New   OT LONG TERM GOAL #3   Title Pt will improve balance for ADLs/IADLs as shown by improving standing functional reach test by 2 inches bilaterally.   Baseline 8 inches bilaterally   Time 8   Period Weeks   Status New   OT LONG TERM GOAL #4   Title Pt will improve RUE functional reaching/coordination as shown by improving time on box and blocks test by at least 5.   Baseline R-51 blocks, L-51 blocks   Time 8   Period Weeks   Status New   OT LONG TERM GOAL #5   Title Pt will demo at least 120* R shoulder flex with 3/10 pain or less.   Baseline 90* with 6/10 pain   Time 8   Period Weeks   Status New   Long Term Additional Goals   Additional Long Term Goals Yes   OT LONG TERM GOAL #6   Title Pt will demo at least 100* R shoulder abduction for ADLs.   Baseline 70* with pain   Time 8   Period Weeks   Status New               Plan - 04/17/15 1334    Clinical Impression Statement Pt is progressing towards goals limited by shoulder pain.   Pt will benefit from skilled therapeutic intervention in order to improve on the following deficits (Retired) Pain;Impaired UE functional use;Decreased knowledge of use of DME;Decreased activity tolerance;Impaired tone;Decreased mobility;Decreased strength;Decreased coordination;Decreased range of motion;Improper body mechanics   Rehab Potential Good   Clinical Impairments Affecting Rehab Potential R shoulder pain   OT Frequency 2x / week   OT Duration 8 weeks   OT Treatment/Interventions Moist Heat;Passive range of motion;Electrical Stimulation;Therapeutic  activities;DME and/or AE instruction;Cryotherapy;Therapeutic exercises;Neuromuscular education;Ultrasound;Self-care/ADL training;Therapeutic exercise;Functional Mobility Training;Patient/family education;Manual  Therapy   Plan continue to address ROM and trunk movement/ posture   Consulted and Agree with Plan of Care Patient        Problem List Patient Active Problem List   Diagnosis Date Noted  . Foot pain 02/25/2015  . Skin tear of forearm without complication 56/70/1410  . Skin lesion 12/10/2014  . Muscle pain 07/20/2014  . Shoulder pain 04/17/2014  . PD (Parkinson's disease) ([place]) 06/05/2013  . Tremor 05/18/2013  . Irritation of ear 05/18/2013  . Medicare annual wellness visit, subsequent 05/02/2012  . Neck pain 05/02/2012  . Osteoporosis 03/08/2011  . Neck mass 03/08/2011  . Breast cancer ([place]) 11/19/2010  . DYSPEPSIA&OTHER SPEC DISORDERS FUNCTION STOMACH 08/01/2008  . DYSPHAGIA UNSPECIFIED 08/01/2008  . ORTHOSTATIC HYPOTENSION 06/28/2008  . Goiter 07/10/2007  . HLD (hyperlipidemia) 07/10/2007  . MONOCLONAL GAMMOPATHY 07/10/2007  . ANEMIA-NOS 07/10/2007  . FATIGUE 07/10/2007    [name],Megan 04/17/2015, 1:41 PM Theone Murdoch, OTR/L [phone] Phone: [phone] 1:41 PM 04/17/2015 [facility] [address] [place], [name], [address] Phone: [phone]   Fax:  [phone]  Name: [name] MRN: [id number] Date of Birth: 15-Aug-1940

## 2015-04-21 ENCOUNTER — Ambulatory Visit (HOSPITAL_COMMUNITY)
Admission: RE | Admit: 2015-04-21 | Discharge: 2015-04-21 | Disposition: A | Discharge status: Home / Self Care | Payer: Medicare Other | Source: Ambulatory Visit | Attending: Neurology | Admitting: Neurology

## 2015-04-21 ENCOUNTER — Ambulatory Visit: Payer: Medicare Other | Admitting: Occupational Therapy

## 2015-04-21 DIAGNOSIS — R1310 Dysphagia, unspecified: Secondary | ICD-10-CM

## 2015-04-21 DIAGNOSIS — M25611 Stiffness of right shoulder, not elsewhere classified: Secondary | ICD-10-CM | POA: Diagnosis not present

## 2015-04-21 DIAGNOSIS — R29898 Other symptoms and signs involving the musculoskeletal system: Secondary | ICD-10-CM | POA: Diagnosis not present

## 2015-04-21 DIAGNOSIS — R1319 Other dysphagia: Secondary | ICD-10-CM | POA: Diagnosis not present

## 2015-04-21 DIAGNOSIS — R279 Unspecified lack of coordination: Secondary | ICD-10-CM | POA: Diagnosis not present

## 2015-04-21 DIAGNOSIS — M25511 Pain in right shoulder: Secondary | ICD-10-CM | POA: Diagnosis not present

## 2015-04-21 DIAGNOSIS — R258 Other abnormal involuntary movements: Secondary | ICD-10-CM | POA: Diagnosis not present

## 2015-04-21 DIAGNOSIS — R293 Abnormal posture: Secondary | ICD-10-CM | POA: Diagnosis not present

## 2015-04-21 DIAGNOSIS — G20 Parkinson's disease: Secondary | ICD-10-CM | POA: Diagnosis not present

## 2015-04-21 NOTE — Therapy (Signed)
[facility] [address] [place], [name], [address] Phone: [phone]   Fax:  [phone]  Occupational Therapy Treatment  Patient Details  Name: [name] MRN: [id number] Date of Birth: 1940/09/22 Referring Provider: Dr. Wells Guiles Tat  Encounter Date: 04/21/2015      OT End of Session - 04/21/15 1614    Visit Number 4   Number of Visits 17   Date for OT Re-Evaluation 06/07/15   Authorization Type Medicare, BCBS (g-code needed)   Authorization - Visit Number 4   Authorization - Number of Visits 10   OT Start Time 1537   OT Stop Time 1617   OT Time Calculation (min) 40 min   Activity Tolerance Patient tolerated treatment well   Behavior During Therapy [facility] for tasks assessed/performed      Past Medical History  Diagnosis Date  . Esophageal Stricture   . Monoclonal gammopathy   . Anemia     Anemia-NOS / PMH, Dr Julien Nordmann  . Hyperlipidemia   . Vertical diplopia   . MVP (mitral valve prolapse)   . Breast cancer (HCC)     stage I right breast  . Skin cancer     basal cell L neck  . Allergy     SEASONAL  . Arthritis   . Cataract     BILATERAL  . Osteoporosis     Past Surgical History  Procedure Laterality Date  . Total abdominal hysterectomy w/ bilateral salpingoophorectomy  75 yrs old    for pain,prolapse ; G3 P2  . Salpingoophorectomy  2002    for benign growths  . Hemorrhoid surgery  1970's  . Appendectomy  2002  . Cardiac catheterization  2005    neg  . Colonoscopy      negative   . Bone biopsy  11/06/10  . Breast lumpectomy  02/2010    right breast lumpectomy and sentinel node biopsy  . Upper gastrointestinal endoscopy      There were no vitals filed for this visit.  Visit Diagnosis:  Stiffness of joint, shoulder region, right  Pain in joint of right shoulder  Bradykinesia  Rigidity  Posture abnormality      Subjective Assessment - 04/21/15 1612    Subjective  Pt reports  that shoulder pain was bad yesterday after washing the car on Saturday.  Pt reports that she had a MBSS today.   Pertinent History hx of R breast CA with lumpectomy and lymph node removal and radiation 2012; multiple myeloma, osteoporesis   Patient Stated Goals improve R shoulder pain   Currently in Pain? Yes   Pain Score --  4-6/10   Pain Location Shoulder   Pain Orientation Right   Pain Descriptors / Indicators Aching   Pain Type Chronic pain   Pain Frequency Constant   Aggravating Factors  use   Pain Relieving Factors rest         Treatment:   PWR! Up in supine and sitting, PWR! Twist in supine (low range), PWR! Rock in supine as tolerated low-mid range range for body on arm movements and trunk movement/posture  Shoulder flex AAROM in sidelying with min cues/facilitation.  Then AROM ER as tolerated.  Shoulder abduction table slides in sitting with min cueing initially to move within pain tolerance  Shoulder flex AAROM with hemiglide as with min cues, higher range noted today  Shoulder flex with pool noodle x 10 reps in supine and sitting with min v.c.  Wt. Bearing through  RUE in abduction in sitting with body on arm movements, wt. shift/trunkrotation with min cues.  Arm bike x18mn level 1 for reciprocal movement.  Pt demo approx 110-115* shoulder flex at end of session with 4/10 pain                        OT Short Term Goals - 04/10/15 0920    OT SHORT TERM GOAL #1   Title Pt will be independent with initial HEP--check STGs 05/08/15   Time 4   Period Weeks   Status New   OT SHORT TERM GOAL #2   Title Pt will demo at least 105* R shoulder flex with 3/10 pain or less.   Baseline 90* with 6/10 pain   Time 4   Period Weeks   Status New   OT SHORT TERM GOAL #3   Title Pt will report RUE pain less than or equal to 5/10 for ADLs.   Time 4   Period Weeks   Status New   OT SHORT TERM GOAL #4   Title Pt will demo at least 85* R shoulder abduction  for ADLs.   Baseline 70*   Time 4   Period Weeks   Status New           OT Long Term Goals - 04/10/15 02409   OT LONG TERM GOAL #1   Title Pt will verbalize understanding of updated strategies to increase ease, safety, and independence and decr pain with ADLs/IADLs prn.--check LTGs 06/07/15   Time 8   Period Weeks   Status New   OT LONG TERM GOAL #2   Title Pt will improve LUE functional reaching/coordination as shown by improving time on box and blocks test by at least 4.   Baseline R-51 blocks, L-51 blocks   Status New   OT LONG TERM GOAL #3   Title Pt will improve balance for ADLs/IADLs as shown by improving standing functional reach test by 2 inches bilaterally.   Baseline 8 inches bilaterally   Time 8   Period Weeks   Status New   OT LONG TERM GOAL #4   Title Pt will improve RUE functional reaching/coordination as shown by improving time on box and blocks test by at least 5.   Baseline R-51 blocks, L-51 blocks   Time 8   Period Weeks   Status New   OT LONG TERM GOAL #5   Title Pt will demo at least 120* R shoulder flex with 3/10 pain or less.   Baseline 90* with 6/10 pain   Time 8   Period Weeks   Status New   Long Term Additional Goals   Additional Long Term Goals Yes   OT LONG TERM GOAL #6   Title Pt will demo at least 100* R shoulder abduction for ADLs.   Baseline 70* with pain   Time 8   Period Weeks   Status New               Plan - 04/21/15 1614    Clinical Impression Statement Pt is progressing with incr ROM and decr pain by end of session (4/10).   Plan continue to address ROM and trunk movement/posture   Consulted and Agree with Plan of Care Patient        Problem List Patient Active Problem List   Diagnosis Date Noted  . Foot pain 02/25/2015  . Skin tear of forearm without complication 173/53/2992 .  Skin lesion 12/10/2014  . Muscle pain 07/20/2014  . Shoulder pain 04/17/2014  . PD (Parkinson's disease) ([place]) 06/05/2013  . Tremor  05/18/2013  . Irritation of ear 05/18/2013  . Medicare annual wellness visit, subsequent 05/02/2012  . Neck pain 05/02/2012  . Osteoporosis 03/08/2011  . Neck mass 03/08/2011  . Breast cancer ([place]) 11/19/2010  . DYSPEPSIA&OTHER SPEC DISORDERS FUNCTION STOMACH 08/01/2008  . DYSPHAGIA UNSPECIFIED 08/01/2008  . ORTHOSTATIC HYPOTENSION 06/28/2008  . Goiter 07/10/2007  . HLD (hyperlipidemia) 07/10/2007  . MONOCLONAL GAMMOPATHY 07/10/2007  . ANEMIA-NOS 07/10/2007  . FATIGUE 07/10/2007    [facility] 04/21/2015, 4:26 PM  [facility] [address] [place], [name], [address] Phone: [phone]   Fax:  [phone]  Name: SHUNA TABOR MRN: [id number] Date of Birth: 1940/03/25   Vianne Bulls, OTR/L [facility] [address] [place], [place]  [address] [phone] phone [phone] 04/21/2015 4:26 PM

## 2015-04-22 ENCOUNTER — Telehealth: Payer: Self-pay | Admitting: Neurology

## 2015-04-22 ENCOUNTER — Ambulatory Visit: Payer: Medicare Other | Admitting: Occupational Therapy

## 2015-04-22 ENCOUNTER — Other Ambulatory Visit: Payer: Self-pay | Admitting: Internal Medicine

## 2015-04-22 DIAGNOSIS — R258 Other abnormal involuntary movements: Secondary | ICD-10-CM

## 2015-04-22 DIAGNOSIS — R279 Unspecified lack of coordination: Secondary | ICD-10-CM

## 2015-04-22 DIAGNOSIS — R29898 Other symptoms and signs involving the musculoskeletal system: Secondary | ICD-10-CM

## 2015-04-22 DIAGNOSIS — M25511 Pain in right shoulder: Secondary | ICD-10-CM | POA: Diagnosis not present

## 2015-04-22 DIAGNOSIS — R293 Abnormal posture: Secondary | ICD-10-CM

## 2015-04-22 DIAGNOSIS — M25611 Stiffness of right shoulder, not elsewhere classified: Secondary | ICD-10-CM | POA: Diagnosis not present

## 2015-04-22 DIAGNOSIS — R278 Other lack of coordination: Secondary | ICD-10-CM

## 2015-04-22 MED ORDER — VENLAFAXINE HCL ER 75 MG PO CP24
75.0000 mg | ORAL_CAPSULE | Freq: Every day | ORAL | Status: DC
Start: 2015-04-22 — End: 2015-06-21

## 2015-04-22 NOTE — Telephone Encounter (Signed)
Okay.  Thank you.  Try effexor XR 75 mg daily

## 2015-04-22 NOTE — Therapy (Signed)
[facility] [address] [place], [name], [address] Phone: [phone]   Fax:  [phone]  Occupational Therapy Treatment  Patient Details  Name: [name] MRN: [id number] Date of Birth: 1940/10/31 Referring Provider: Dr. Wells Guiles Tat  Encounter Date: 04/22/2015      OT End of Session - 04/22/15 1540    Visit Number 5   Number of Visits 17   Date for OT Re-Evaluation 06/07/15   Authorization Type Medicare, BCBS (g-code needed)   Authorization - Visit Number 5   Authorization - Number of Visits 10   OT Start Time 0160   OT Stop Time 1615   OT Time Calculation (min) 42 min   Activity Tolerance Patient tolerated treatment well   Behavior During Therapy [facility] for tasks assessed/performed      Past Medical History  Diagnosis Date  . Esophageal Stricture   . Monoclonal gammopathy   . Anemia     Anemia-NOS / PMH, Dr Julien Nordmann  . Hyperlipidemia   . Vertical diplopia   . MVP (mitral valve prolapse)   . Breast cancer (HCC)     stage I right breast  . Skin cancer     basal cell L neck  . Allergy     SEASONAL  . Arthritis   . Cataract     BILATERAL  . Osteoporosis     Past Surgical History  Procedure Laterality Date  . Total abdominal hysterectomy w/ bilateral salpingoophorectomy  74 yrs old    for pain,prolapse ; G3 P2  . Salpingoophorectomy  2002    for benign growths  . Hemorrhoid surgery  1970's  . Appendectomy  2002  . Cardiac catheterization  2005    neg  . Colonoscopy      negative   . Bone biopsy  11/06/10  . Breast lumpectomy  02/2010    right breast lumpectomy and sentinel node biopsy  . Upper gastrointestinal endoscopy      There were no vitals filed for this visit.  Visit Diagnosis:  Pain in joint of right shoulder  Stiffness of joint, shoulder region, right  Bradykinesia  Rigidity  Posture abnormality  Decreased coordination      Subjective Assessment - 04/22/15 1538     Subjective  "It's hurting today"   Pertinent History hx of R breast CA with lumpectomy and lymph node removal and radiation 2012; multiple myeloma, osteoporesis   Patient Stated Goals improve R shoulder pain   Currently in Pain? Yes   Pain Score 5    Pain Location Shoulder   Pain Orientation Right   Pain Descriptors / Indicators Aching   Pain Type Chronic pain   Pain Onset More than a month ago   Pain Frequency Intermittent   Aggravating Factors  use   Pain Relieving Factors rest          Treatment:   PWR! Up in supine, PWR! Twist in supine (low range), PWR! Rock in supine as tolerated low-mid range range for body on arm movements and trunk movement/posture with min v.c. For amplitude and performance/positioning  Shoulder flex AAROM in sidelying with min cues/facilitation.   Shoulder abduction table slides in sitting with min cueing initially to move within pain tolerance  Shoulder flex AAROM with hemiglide as with min cues, higher range noted today  Chest press with pool noodle AAROM in supine with min v.c.  Shoulder flex with pool noodle in supine and sitting with min v.c. For posture/positioning  Wt. Bearing through RUE in abduction in sitting with body on arm movements, wt. Shift/trunk rotation with min cues.  Attempted PWR! Twist in prone, but d/c due to pain  In prone, scapular retraction and depression with min v.c.  Open chain functional reaching (mid-range) with 1/10 pain incorporating low range trunk rotation and ER with min cues for larger amplitude and posture.  Instructed pt in posture for improved RUE ROM and to incr awareness and arm swing with walking.  Pt verbalized understanding.                       OT Education - 04/22/15 1620    Education Details Importance of correcting posture to help with shoulder alignment prior to RUE functional reach   Person(s) Educated Patient   Methods Explanation;Demonstration;Verbal cues;Tactile  cues   Comprehension Verbalized understanding;Verbal cues required;Returned demonstration          OT Short Term Goals - 04/10/15 0920    OT SHORT TERM GOAL #1   Title Pt will be independent with initial HEP--check STGs 05/08/15   Time 4   Period Weeks   Status New   OT SHORT TERM GOAL #2   Title Pt will demo at least 105* R shoulder flex with 3/10 pain or less.   Baseline 90* with 6/10 pain   Time 4   Period Weeks   Status New   OT SHORT TERM GOAL #3   Title Pt will report RUE pain less than or equal to 5/10 for ADLs.   Time 4   Period Weeks   Status New   OT SHORT TERM GOAL #4   Title Pt will demo at least 85* R shoulder abduction for ADLs.   Baseline 70*   Time 4   Period Weeks   Status New           OT Long Term Goals - 04/10/15 7846    OT LONG TERM GOAL #1   Title Pt will verbalize understanding of updated strategies to increase ease, safety, and independence and decr pain with ADLs/IADLs prn.--check LTGs 06/07/15   Time 8   Period Weeks   Status New   OT LONG TERM GOAL #2   Title Pt will improve LUE functional reaching/coordination as shown by improving time on box and blocks test by at least 4.   Baseline R-51 blocks, L-51 blocks   Status New   OT LONG TERM GOAL #3   Title Pt will improve balance for ADLs/IADLs as shown by improving standing functional reach test by 2 inches bilaterally.   Baseline 8 inches bilaterally   Time 8   Period Weeks   Status New   OT LONG TERM GOAL #4   Title Pt will improve RUE functional reaching/coordination as shown by improving time on box and blocks test by at least 5.   Baseline R-51 blocks, L-51 blocks   Time 8   Period Weeks   Status New   OT LONG TERM GOAL #5   Title Pt will demo at least 120* R shoulder flex with 3/10 pain or less.   Baseline 90* with 6/10 pain   Time 8   Period Weeks   Status New   Long Term Additional Goals   Additional Long Term Goals Yes   OT LONG TERM GOAL #6   Title Pt will demo at  least 100* R shoulder abduction for ADLs.   Baseline 70* with pain   Time 8  Period Weeks   Status New               Plan - 04/22/15 1618    Clinical Impression Statement Pt is progressing with incr ROM and decr pain with pain as low as 0-2/10 at times during session.    Plan continue to address ROM and trunk movement/posture   Consulted and Agree with Plan of Care Patient        Problem List Patient Active Problem List   Diagnosis Date Noted  . Foot pain 02/25/2015  . Skin tear of forearm without complication 02/63/7858  . Skin lesion 12/10/2014  . Muscle pain 07/20/2014  . Shoulder pain 04/17/2014  . PD (Parkinson's disease) ([place]) 06/05/2013  . Tremor 05/18/2013  . Irritation of ear 05/18/2013  . Medicare annual wellness visit, subsequent 05/02/2012  . Neck pain 05/02/2012  . Osteoporosis 03/08/2011  . Neck mass 03/08/2011  . Breast cancer ([place]) 11/19/2010  . DYSPEPSIA&OTHER SPEC DISORDERS FUNCTION STOMACH 08/01/2008  . DYSPHAGIA UNSPECIFIED 08/01/2008  . ORTHOSTATIC HYPOTENSION 06/28/2008  . Goiter 07/10/2007  . HLD (hyperlipidemia) 07/10/2007  . MONOCLONAL GAMMOPATHY 07/10/2007  . ANEMIA-NOS 07/10/2007  . FATIGUE 07/10/2007    [facility] 04/22/2015, 4:37 PM  [facility] [address] [place], [name], [address] Phone: [phone]   Fax:  [phone]  Name: [name] MRN: [id number] Date of Birth: Nov 25, 1940  Vianne Bulls, OTR/L [facility] [address] [place], [place]  [address] [phone] phone [phone] 04/22/2015 4:37 PM   Vianne Bulls, OTR/L [facility] [address] [place], [place]  [address] [phone] phone [phone] 04/22/2015 4:37 PM

## 2015-04-22 NOTE — Telephone Encounter (Signed)
Patient called and states she spoke with Dr Rogue Jury who advised her not to take Nuedexa because it will interfere with her Tamoxifen. Please advise of any alternative.

## 2015-04-22 NOTE — Telephone Encounter (Signed)
Patient aware. RX sent to pharmacy. She will let us know how she does on medication.

## 2015-04-22 NOTE — Telephone Encounter (Signed)
PT called and would like a call back in regards to medication/Dawn CB# [phone]

## 2015-04-24 ENCOUNTER — Ambulatory Visit (INDEPENDENT_AMBULATORY_CARE_PROVIDER_SITE_OTHER): Payer: Medicare Other | Admitting: Gastroenterology

## 2015-04-24 VITALS — BP 100/60 | HR 80 | Ht 65.0 in | Wt 121.0 lb

## 2015-04-24 DIAGNOSIS — G20 Parkinson's disease: Secondary | ICD-10-CM

## 2015-04-24 DIAGNOSIS — R198 Other specified symptoms and signs involving the digestive system and abdomen: Secondary | ICD-10-CM | POA: Insufficient documentation

## 2015-04-24 DIAGNOSIS — F458 Other somatoform disorders: Secondary | ICD-10-CM

## 2015-04-24 DIAGNOSIS — R0989 Other specified symptoms and signs involving the circulatory and respiratory systems: Secondary | ICD-10-CM | POA: Insufficient documentation

## 2015-04-24 DIAGNOSIS — R1909 Other intra-abdominal and pelvic swelling, mass and lump: Secondary | ICD-10-CM

## 2015-04-24 NOTE — Patient Instructions (Addendum)
If you are age 74 or older, your body mass index should be between 23-30. Your There is no weight on file to calculate BMI. If this is out of the aforementioned range listed, please consider follow up with your Primary Care Provider.  If you are age 29 or younger, your body mass index should be between 19-25. Your There is no weight on file to calculate BMI. If this is out of the aformentioned range listed, please consider follow up with your Primary Care Provider.   You have been scheduled for a CT scan of the abdomen at [facility] ([address]---this is in the same building as Press [facility]).     You are scheduled on 04-25-2015 at 9:30am. You should arrive 15 minutes prior to your appointment time for registration. Please follow the written instructions below on the day of your exam:  WARNING: IF YOU ARE ALLERGIC TO IODINE/X-RAY DYE, PLEASE NOTIFY RADIOLOGY IMMEDIATELY AT [phone]! YOU WILL BE GIVEN A 13 HOUR PREMEDICATION PREP.  1) Do not eat or drink anything after 7:30am (2 hours prior to your test) 2) You have been given 2 bottles of oral contrast to drink. The solution may taste               better if refrigerated, but do NOT add ice or any other liquid to this solution. Shake             well before drinking.    Drink 1 bottle of contrast @ 8:30am (1 hour prior to your exam)   You may take any medications as prescribed with a small amount of water except for the following: Metformin, Glucophage, Glucovance, Avandamet, Riomet, Fortamet, Actoplus Met, Janumet, Glumetza or Metaglip. The above medications must be held the day of the exam AND 48 hours after the exam.  The purpose of you drinking the oral contrast is to aid in the visualization of your intestinal tract. The contrast solution may cause some diarrhea. Before your exam is started, you will be given a small amount of fluid to drink. Depending on your individual set of symptoms, you may also receive an  intravenous injection of x-ray contrast/dye. Plan on being at [facility] for 30 minutes or longer, depending on the type of exam you are having performed.  This test typically takes 30-45 minutes to complete.  If you have any questions regarding your exam or if you need to reschedule, you may call the CT department at [phone] between the hours of 8:00 am and 5:00 pm, Monday-Friday.  ________________________________________________________________________  Thank you for choosing  GI  Dr Wilfrid Lund III

## 2015-04-24 NOTE — Progress Notes (Signed)
[name] Consult Note:  History: [name] 04/24/2015  Referring physician: [name], MD  Reason for consult/chief complaint: Dysphagia   Subjective HPI:  This patient was sent for consultation regarding dysphagia. In fact, which she describes is a feeling of a lump and discomfort in the neck that is there most of the time, somewhat worse after eating. She denies frank dysphagia, but sometimes coughs with eating. She has had Parkinson's disease for the last few years, thus she underwent modified barium study recently with results noted below. She denies odynophagia nausea vomiting early satiety or weight loss. [name] underwent EGD with Dr. Deatra Ina in February 2013, and there was a small hiatal hernia with a mild Schatzki ring. At [facility] dilator was passed, the patient recalls transient improvement in dysphagia. Her symptoms have not improved with the PPI.  ROS:  Review of Systems  Constitutional: Negative for appetite change and unexpected weight change.  HENT: Negative for mouth sores and voice change.   Eyes: Negative for pain and redness.  Respiratory: Positive for cough. Negative for shortness of breath.   Cardiovascular: Negative for chest pain and palpitations.  Genitourinary: Negative for dysuria and hematuria.  Musculoskeletal: Negative for myalgias and arthralgias.  Skin: Negative for pallor and rash.  Neurological: Positive for tremors. Negative for weakness and headaches.  Hematological: Negative for adenopathy.   The cough is occ'l with eating as noted above  Past Medical History: Past Medical History  Diagnosis Date  . Esophageal Stricture   . Monoclonal gammopathy   . Anemia     Anemia-NOS / PMH, Dr Julien Nordmann  . Hyperlipidemia   . Vertical diplopia   . MVP (mitral valve prolapse)   . Breast cancer (HCC)     stage I right breast  . Skin cancer     basal cell L neck  . Allergy     SEASONAL  . Arthritis   . Cataract      BILATERAL  . Osteoporosis      Past Surgical History: Past Surgical History  Procedure Laterality Date  . Total abdominal hysterectomy w/ bilateral salpingoophorectomy  75 yrs old    for pain,prolapse ; G3 P2  . Salpingoophorectomy  2002    for benign growths  . Hemorrhoid surgery  1970's  . Appendectomy  2002  . Cardiac catheterization  2005    neg  . Colonoscopy      negative   . Bone biopsy  11/06/10  . Breast lumpectomy  02/2010    right breast lumpectomy and sentinel node biopsy  . Upper gastrointestinal endoscopy       Family History: Family History  Problem Relation Age of Onset  . Diabetes Mother   . Heart disease Mother   . Diabetes Brother   . Heart disease Brother   . Hepatitis Father   . Breast cancer Neg Hx   . Colon cancer Neg Hx   . Diabetes Brother   . Diabetes Brother     Social History: Social History   Social History  . Marital Status: Widowed    Spouse Name: N/A  . Number of Children: 2  . Years of Education: N/A   Occupational History  . Retired    Social History Main Topics  . Smoking status: Never Smoker   . Smokeless tobacco: Never Used  . Alcohol Use: No  . Drug Use: No  . Sexual Activity: Not on file   Other Topics Concern  . Not on file  Social History Narrative   Occupation: Textile   Widowed 2016, husband had cancer, was married in 1959    Patient has never smoked.    Alcohol Use - no   Illicit Drug Use - no   Patient does not get regular exercise.    Daily Caffeine Use: 3 cups of coffee daily    Allergies: Allergies  Allergen Reactions  . Penicillins Shortness Of Breath and Itching    Outpatient Meds: Current Outpatient Prescriptions  Medication Sig Dispense Refill  . aspirin 81 MG tablet Take 81 mg by mouth daily.      [name] b complex vitamins tablet Take 1 tablet by mouth daily.    . Calcium 1500 MG tablet Take 1,500 mg by mouth.      . carbidopa-levodopa (SINEMET IR) 25-100 MG tablet Take 1 tablet by  mouth 3 (three) times daily. 90 tablet 5  . cholecalciferol (VITAMIN D) 1000 UNITS tablet Take 5,000 Units by mouth daily.      . Denosumab (PROLIA [place]) Inject into the skin every 6 (six) months.    . Multiple Vitamin (MULTIVITAMIN) capsule 2 tabs po qd     . nitroGLYCERIN (NITROSTAT) 0.3 MG SL tablet Place 0.3 mg under the tongue every 5 (five) minutes as needed. Reported on 04/07/2015    . Omega-3 Fatty Acids (OMEGA 3 PO) 1 tab po qd     . tamoxifen (NOLVADEX) 20 MG tablet TAKE ONE TABLET BY MOUTH ONCE DAILY 30 tablet 0  . traMADol (ULTRAM) 50 MG tablet TAKE ONE TO TWO TABLETS BY MOUTH AT BEDTIME AS NEEDED FOR PAIN 60 tablet 5  . venlafaxine XR (EFFEXOR XR) 75 MG 24 hr capsule Take 1 capsule (75 mg total) by mouth daily with breakfast. 30 capsule 1  . vitamin B-12 (CYANOCOBALAMIN) 500 MCG tablet Take 2500 mcg daily.     No current facility-administered medications for this visit.      ___________________________________________________________________ Objective  Exam:  BP 100/60 mmHg  Pulse 80  Ht 5\' 5"  (1.651 m)  Wt 121 lb (54.885 kg)  BMI 20.14 kg/m2   General: this is a(n) Well-appearing elderly woman, good muscle mass. No dysarthria or hoarseness   Eyes: sclera anicteric, no redness  ENT: oral mucosa moist without lesions, no cervical or supraclavicular lymphadenopathy, good dentition  CV: RRR without murmur, S1/S2, no JVD, no peripheral edema  Resp: clear to auscultation bilaterally, normal RR and effort noted  GI: FULLNESS IN UPPER ABDOMEN,  soft, no tenderness, with active bowel sounds. No guarding or palpable organomegaly noted.  Skin; warm and dry, no rash or jaundice noted  Neuro: awake, alert and oriented x 3. Normal gross motor function and fluent speech  Labs:  None to review  Radiologic Studies:  MBS 04/21/15 - mild OP dysphagia  Assessment: Encounter Diagnoses  Name Primary?  . Globus sensation Yes  . PD (Parkinson's disease) ([place])   . Upper  abdominal mass     Globus sensation from a combination of mild oropharyngeal dysphagia causing pooling in the vallecula, as well as probable element of sinus postnasal drip and mild GERD. This does not have the sound of a mechanical cause such as a ring or stricture.  Plan:  CT abdomen with contrast to evaluate abdominal fullness. I do not feel she needs an upper endoscopy at this time. Chin tuck maneuver for swallowing was advised, and she has neurology follow-up.  Thank you for the courtesy of this consult.  Please call me with any  questions or concerns.  Nelida Meuse III

## 2015-04-25 ENCOUNTER — Ambulatory Visit (INDEPENDENT_AMBULATORY_CARE_PROVIDER_SITE_OTHER)
Admission: RE | Admit: 2015-04-25 | Discharge: 2015-04-25 | Disposition: A | Discharge status: Home / Self Care | Payer: Medicare Other | Source: Ambulatory Visit | Attending: Gastroenterology | Admitting: Gastroenterology

## 2015-04-25 DIAGNOSIS — R0989 Other specified symptoms and signs involving the circulatory and respiratory systems: Secondary | ICD-10-CM

## 2015-04-25 DIAGNOSIS — R1900 Intra-abdominal and pelvic swelling, mass and lump, unspecified site: Secondary | ICD-10-CM | POA: Diagnosis not present

## 2015-04-25 DIAGNOSIS — G20 Parkinson's disease: Secondary | ICD-10-CM

## 2015-04-25 DIAGNOSIS — R1909 Other intra-abdominal and pelvic swelling, mass and lump: Secondary | ICD-10-CM | POA: Diagnosis not present

## 2015-04-25 DIAGNOSIS — R198 Other specified symptoms and signs involving the digestive system and abdomen: Secondary | ICD-10-CM

## 2015-04-25 MED ORDER — IOPAMIDOL (ISOVUE-300) INJECTION 61%
80.0000 mL | Freq: Once | INTRAVENOUS | Status: AC | PRN
  Administered 2015-04-25: 80 mL via INTRAVENOUS

## 2015-04-28 ENCOUNTER — Encounter: Payer: Self-pay | Admitting: Family Medicine

## 2015-04-28 ENCOUNTER — Ambulatory Visit: Payer: Medicare Other | Attending: Family Medicine | Admitting: Occupational Therapy

## 2015-04-28 ENCOUNTER — Encounter: Payer: Self-pay | Admitting: Occupational Therapy

## 2015-04-28 ENCOUNTER — Ambulatory Visit (INDEPENDENT_AMBULATORY_CARE_PROVIDER_SITE_OTHER): Payer: Medicare Other | Admitting: Family Medicine

## 2015-04-28 VITALS — BP 122/72 | HR 80 | Temp 98.5°F | Wt 119.8 lb

## 2015-04-28 DIAGNOSIS — R29898 Other symptoms and signs involving the musculoskeletal system: Secondary | ICD-10-CM | POA: Diagnosis not present

## 2015-04-28 DIAGNOSIS — R293 Abnormal posture: Secondary | ICD-10-CM | POA: Diagnosis not present

## 2015-04-28 DIAGNOSIS — M25511 Pain in right shoulder: Secondary | ICD-10-CM | POA: Diagnosis not present

## 2015-04-28 DIAGNOSIS — R7309 Other abnormal glucose: Secondary | ICD-10-CM | POA: Insufficient documentation

## 2015-04-28 DIAGNOSIS — M25611 Stiffness of right shoulder, not elsewhere classified: Secondary | ICD-10-CM | POA: Insufficient documentation

## 2015-04-28 DIAGNOSIS — R29818 Other symptoms and signs involving the nervous system: Secondary | ICD-10-CM | POA: Diagnosis not present

## 2015-04-28 DIAGNOSIS — K219 Gastro-esophageal reflux disease without esophagitis: Secondary | ICD-10-CM | POA: Diagnosis not present

## 2015-04-28 LAB — HEMOGLOBIN A1C: Hgb A1c MFr Bld: 5.8 % (ref 4.6–6.5)

## 2015-04-28 MED ORDER — NITROGLYCERIN 0.3 MG SL SUBL: 0.3000 mg | SUBLINGUAL_TABLET | SUBLINGUAL | Status: DC | PRN

## 2015-04-28 MED ORDER — OMEPRAZOLE MAGNESIUM 20 MG PO TBEC: 20.0000 mg | DELAYED_RELEASE_TABLET | Freq: Every day | ORAL | Status: DC

## 2015-04-28 NOTE — Assessment & Plan Note (Signed)
I think she likely had GERD with esophageal spasm that is NTG responsive.  Some of the globus sensation could be from GERD.  Given the longstanding nature of her sx and the absence of exertional sx, I think it is unlikely for her to have cardiac sx. In meantime, start PPI and continue prn NTG use. I think she'll have fewer sx and less NTG use/need while on PPI.  D/w pt.  She agrees.   I think she likely has lightheadedness that is unrelated to the GI sx, and I question if this is from the Parkinsons.  I will ask for Dr. Doristine Devoid input on this.   >25 minutes spent in face to face time with patient, >50% spent in counselling or coordination of care.

## 2015-04-28 NOTE — Progress Notes (Signed)
Pre visit review using our clinic review tool, if applicable. No additional management support is needed unless otherwise documented below in the visit note.  Recently started on venlafaxine.  Nausea- longstanding.  Had started before her husband died, clearly before venlafaxine was added.   She didn't know if she was having sx from hyperglycemia.  Minimal elevation in last sugar noted (lab draws), prev normal o/w.  She has been checking sugar at home.  142 two hours after eating.  FH noted- mult with DM2.    She does have NTG responsive CP happening a few times a month.  It can happen at rest.  She can exert w/o having troubles.  It clearly isn't dependent on exertion.  She can have the sensation of food sticking in the chest when she has an episode.  No vomiting.  Nausea can happen with or without eating.  CT abd recently wnl, d/w pt.  MBS with mild aspiration risk, d/w pt.   She has feeling of lightheadedness, like the feeling of standing too quickly, but can happen w/o position change.    PMH and SH reviewed  ROS: See HPI, otherwise noncontributory.  Meds, vitals, and allergies reviewed.   GEN: nad, alert and oriented, she did feel slightly lightheaded on standing- that quickly resolved.   HEENT: mucous membranes moist, OP wnl NECK: supple w/o LA CV: rrr. PULM: ctab, no inc wob ABD: soft, +bs, no masses appreciated.  Not ttp EXT: no edema SKIN: no acute rash No tremor noted.

## 2015-04-28 NOTE — Assessment & Plan Note (Signed)
Check A1c. 

## 2015-04-28 NOTE — Therapy (Signed)
[facility] [address] [place], [name], [address] Phone: [phone]   Fax:  [phone]  Occupational Therapy Treatment  Patient Details  Name: [name] MRN: [id number] Date of Birth: 1941/01/13 Referring Provider: Dr. Wells Guiles Tat  Encounter Date: 04/28/2015      OT End of Session - 04/28/15 1422    Visit Number 6   Number of Visits 17   Date for OT Re-Evaluation 06/07/15   Authorization Type Medicare, BCBS (g-code needed)   Authorization - Visit Number 6   Authorization - Number of Visits 10   OT Start Time 1406   OT Stop Time 1445   OT Time Calculation (min) 39 min   Activity Tolerance Patient tolerated treatment well   Behavior During Therapy [facility] for tasks assessed/performed      Past Medical History  Diagnosis Date  . Esophageal Stricture   . Monoclonal gammopathy   . Anemia     Anemia-NOS / PMH, Dr Julien Nordmann  . Hyperlipidemia   . Vertical diplopia   . MVP (mitral valve prolapse)   . Breast cancer (HCC)     stage I right breast  . Skin cancer     basal cell L neck  . Allergy     SEASONAL  . Arthritis   . Cataract     BILATERAL  . Osteoporosis     Past Surgical History  Procedure Laterality Date  . Total abdominal hysterectomy w/ bilateral salpingoophorectomy  75 yrs old    for pain,prolapse ; G3 P2  . Salpingoophorectomy  2002    for benign growths  . Hemorrhoid surgery  1970's  . Appendectomy  2002  . Cardiac catheterization  2005    neg  . Colonoscopy      negative   . Bone biopsy  11/06/10  . Breast lumpectomy  02/2010    right breast lumpectomy and sentinel node biopsy  . Upper gastrointestinal endoscopy      There were no vitals filed for this visit.  Visit Diagnosis:  Pain in right shoulder  Stiffness of right shoulder, not elsewhere classified  Abnormal posture  Other symptoms and signs involving the nervous system  Other symptoms and signs involving the  musculoskeletal system      Subjective Assessment - 04/28/15 1419    Subjective  Pt reports that she has episodes of lightheadedness and that GI MD is following up with Dr. Carles Collet   Pertinent History hx of R breast CA with lumpectomy and lymph node removal and radiation 2012; multiple myeloma, osteoporesis   Patient Stated Goals improve R shoulder pain   Currently in Pain? Yes   Pain Score 4    Pain Location Shoulder   Pain Orientation Right   Pain Descriptors / Indicators Aching   Pain Type Chronic pain   Pain Onset More than a month ago   Aggravating Factors  use   Pain Relieving Factors rest            Treatment:   PWR! Up in supine, PWR! Rock in supine as tolerated mid range range for body on arm movements and trunk movement/posture with min v.c. For amplitude and performance/positioning  Shoulder abduction table slides in sitting with min cueing initially to move within pain tolerance  Shoulder flex and abduction AAROM with hemiglide as with min cues, higher range noted today without pain  Shoulder flex with pool noodle in supine and sitting with min v.c. For posture/positioning  Cane ex  for shoulder abduction in supine with min v.c. And ER in sitting with min v.c.  Lateral wt. Shifts in sitting  Arm bike x15mn level 1 for reciprocal movement.                     OT Education - 04/28/15 1635    Education Details Added cane ex for shoulder abduction and ER   Person(s) Educated Patient   Methods Explanation;Demonstration;Handout;Verbal cues   Comprehension Verbalized understanding;Returned demonstration;Verbal cues required          OT Short Term Goals - 04/28/15 1423    OT SHORT TERM GOAL #1   Title Pt will be independent with initial HEP--check STGs 05/08/15   Time 4   Period Weeks   Status Achieved  04/28/15    OT SHORT TERM GOAL #2   Title Pt will demo at least 105* R shoulder flex with 3/10 pain or less.   Baseline 90* with 6/10 pain    Time 4   Period Weeks   Status New   OT SHORT TERM GOAL #3   Title Pt will report RUE pain less than or equal to 5/10 for ADLs.   Time 4   Period Weeks   Status New   OT SHORT TERM GOAL #4   Title Pt will demo at least 85* R shoulder abduction for ADLs.   Baseline 70*   Time 4   Period Weeks   Status New           OT Long Term Goals - 04/10/15 05397   OT LONG TERM GOAL #1   Title Pt will verbalize understanding of updated strategies to increase ease, safety, and independence and decr pain with ADLs/IADLs prn.--check LTGs 06/07/15   Time 8   Period Weeks   Status New   OT LONG TERM GOAL #2   Title Pt will improve LUE functional reaching/coordination as shown by improving time on box and blocks test by at least 4.   Baseline R-51 blocks, L-51 blocks   Status New   OT LONG TERM GOAL #3   Title Pt will improve balance for ADLs/IADLs as shown by improving standing functional reach test by 2 inches bilaterally.   Baseline 8 inches bilaterally   Time 8   Period Weeks   Status New   OT LONG TERM GOAL #4   Title Pt will improve RUE functional reaching/coordination as shown by improving time on box and blocks test by at least 5.   Baseline R-51 blocks, L-51 blocks   Time 8   Period Weeks   Status New   OT LONG TERM GOAL #5   Title Pt will demo at least 120* R shoulder flex with 3/10 pain or less.   Baseline 90* with 6/10 pain   Time 8   Period Weeks   Status New   Long Term Additional Goals   Additional Long Term Goals Yes   OT LONG TERM GOAL #6   Title Pt will demo at least 100* R shoulder abduction for ADLs.   Baseline 70* with pain   Time 8   Period Weeks   Status New               Plan - 04/28/15 1447    Clinical Impression Statement Pt continues to progress with incr R shoulder ROM with decr pain.   Plan continue to address ROM and trunk movements/posture; issue PWR! twist in supine/sitting and PWR! rock in  supine   OT Home Exercise Plan education  provided:  initial HEP; 04/28/15 cane ex for shoulder abduction, ER   Consulted and Agree with Plan of Care Patient        Problem List Patient Active Problem List   Diagnosis Date Noted  . Abnormal glucose 04/28/2015  . Globus sensation 04/24/2015  . Foot pain 02/25/2015  . Skin tear of forearm without complication 67/89/3810  . Skin lesion 12/10/2014  . Muscle pain 07/20/2014  . Shoulder pain 04/17/2014  . PD (Parkinson's disease) ([place]) 06/05/2013  . Tremor 05/18/2013  . Irritation of ear 05/18/2013  . Medicare annual wellness visit, subsequent 05/02/2012  . Neck pain 05/02/2012  . Osteoporosis 03/08/2011  . Neck mass 03/08/2011  . Breast cancer ([place]) 11/19/2010  . GERD (gastroesophageal reflux disease) 08/01/2008  . DYSPHAGIA UNSPECIFIED 08/01/2008  . ORTHOSTATIC HYPOTENSION 06/28/2008  . Goiter 07/10/2007  . HLD (hyperlipidemia) 07/10/2007  . MONOCLONAL GAMMOPATHY 07/10/2007  . ANEMIA-NOS 07/10/2007  . FATIGUE 07/10/2007    [facility] 04/28/2015, 4:38 PM  [facility] [address] [place], [name], [address] Phone: [phone]   Fax:  [phone]  Name: [name] MRN: [id number] Date of Birth: 28-Mar-1940  Vianne Bulls, OTR/L [facility] [address] [place], [place]  [address] [phone] phone [phone] 04/28/2015 4:38 PM

## 2015-04-28 NOTE — Patient Instructions (Signed)
Add on prilosec and update me in about 1 week.  I'll talk to Tat in the meantime.   I think the heartburn and the lightheaded feeling are unrelated.  Take care.  Glad to see you.

## 2015-04-28 NOTE — Patient Instructions (Signed)
  ROM: Abduction - Wand   Holding wand with left hand palm up, push wand directly out to side, leading with other hand palm down, until stretch is felt. Hold 5 seconds. Repeat 10 times per set. Do 2 sessions per day. (Lying down)     Active Assistive Shoulder External Rotation    SIT with stick at waist level, right palm up, other palm down. Step to side, push forearm out from body with hand palm down, and keep elbows bent. Hold. Side step and return to start position. Perform 10 reps. 2X DAY

## 2015-04-30 ENCOUNTER — Ambulatory Visit: Payer: Medicare Other | Admitting: Occupational Therapy

## 2015-04-30 DIAGNOSIS — R29898 Other symptoms and signs involving the musculoskeletal system: Secondary | ICD-10-CM | POA: Diagnosis not present

## 2015-04-30 DIAGNOSIS — M25611 Stiffness of right shoulder, not elsewhere classified: Secondary | ICD-10-CM

## 2015-04-30 DIAGNOSIS — M25511 Pain in right shoulder: Secondary | ICD-10-CM

## 2015-04-30 DIAGNOSIS — R29818 Other symptoms and signs involving the nervous system: Secondary | ICD-10-CM | POA: Diagnosis not present

## 2015-04-30 DIAGNOSIS — R293 Abnormal posture: Secondary | ICD-10-CM

## 2015-04-30 NOTE — Therapy (Signed)
[facility] [address] [place], [name], [address] Phone: [phone]   Fax:  [phone]  Occupational Therapy Treatment  Patient Details  Name: [name] MRN: [id number] Date of Birth: 1940/12/07 Referring Provider: Dr. Wells Guiles Tat  Encounter Date: 04/30/2015      OT End of Session - 04/30/15 1559    Visit Number 7   Number of Visits 17   Date for OT Re-Evaluation 06/07/15   Authorization Type Medicare, BCBS (g-code needed)   Authorization - Visit Number 7   Authorization - Number of Visits 10   OT Start Time 1451   OT Stop Time 1530   OT Time Calculation (min) 39 min   Activity Tolerance Patient tolerated treatment well   Behavior During Therapy [facility] for tasks assessed/performed      Past Medical History  Diagnosis Date  . Esophageal Stricture   . Monoclonal gammopathy   . Anemia     Anemia-NOS / PMH, Dr Julien Nordmann  . Hyperlipidemia   . Vertical diplopia   . MVP (mitral valve prolapse)   . Breast cancer (HCC)     stage I right breast  . Skin cancer     basal cell L neck  . Allergy     SEASONAL  . Arthritis   . Cataract     BILATERAL  . Osteoporosis     Past Surgical History  Procedure Laterality Date  . Total abdominal hysterectomy w/ bilateral salpingoophorectomy  75 yrs old    for pain,prolapse ; G3 P2  . Salpingoophorectomy  2002    for benign growths  . Hemorrhoid surgery  1970's  . Appendectomy  2002  . Cardiac catheterization  2005    neg  . Colonoscopy      negative   . Bone biopsy  11/06/10  . Breast lumpectomy  02/2010    right breast lumpectomy and sentinel node biopsy  . Upper gastrointestinal endoscopy      There were no vitals filed for this visit.  Visit Diagnosis:  No diagnosis found.      Subjective Assessment - 04/30/15 1458    Pertinent History hx of R breast CA with lumpectomy and lymph node removal and radiation 2012; multiple myeloma, osteoporesis   Patient Stated Goals improve R shoulder pain   Pain Score 4    Pain Location Shoulder   Pain Orientation Right   Pain Descriptors / Indicators Aching   Pain Type Chronic pain   Pain Onset More than a month ago   Pain Frequency Intermittent   Aggravating Factors  use   Pain Relieving Factors rest   Multiple Pain Sites No        Treatment:   PWR! Up in supine, PWR! Rock in supine, and PWR! twist as tolerated mid range range for body on arm movements and trunk movement/posture with min v.c. For amplitude and performance/positioning. Therapist issued as HEP, pt verbalized understanding. Seated bilateral shoulder flexion to grossly 105-110 x 10 reps with ball. Dynamic step and reach with trunk rotation with bilateral UE's, min v.c. For large amplitude movements. Pt reports she has not been feeling well and her PCP is aware. Pt reports having episodes of lightheadedness and nausea.  Therapist monitored BP, seated 115/65, after standing 1-2 mins BP 104/60, and after standing with dynamic reaching it was 10/64. Pt was instructed to keep a journal of the time of day she is not feeling well and what she was doing at the  time to share with her MD if needed. Pt was instructed that if the lightheadedness and nausea  continues she needs to contact Dr. Carles Collet.                        OT Short Term Goals - 04/28/15 1423    OT SHORT TERM GOAL #1   Title Pt will be independent with initial HEP--check STGs 05/08/15   Time 4   Period Weeks   Status Achieved  04/28/15    OT SHORT TERM GOAL #2   Title Pt will demo at least 105* R shoulder flex with 3/10 pain or less.   Baseline 90* with 6/10 pain   Time 4   Period Weeks   Status New   OT SHORT TERM GOAL #3   Title Pt will report RUE pain less than or equal to 5/10 for ADLs.   Time 4   Period Weeks   Status New   OT SHORT TERM GOAL #4   Title Pt will demo at least 85* R shoulder abduction for ADLs.   Baseline 70*   Time 4   Period  Weeks   Status New           OT Long Term Goals - 04/10/15 0211    OT LONG TERM GOAL #1   Title Pt will verbalize understanding of updated strategies to increase ease, safety, and independence and decr pain with ADLs/IADLs prn.--check LTGs 06/07/15   Time 8   Period Weeks   Status New   OT LONG TERM GOAL #2   Title Pt will improve LUE functional reaching/coordination as shown by improving time on box and blocks test by at least 4.   Baseline R-51 blocks, L-51 blocks   Status New   OT LONG TERM GOAL #3   Title Pt will improve balance for ADLs/IADLs as shown by improving standing functional reach test by 2 inches bilaterally.   Baseline 8 inches bilaterally   Time 8   Period Weeks   Status New   OT LONG TERM GOAL #4   Title Pt will improve RUE functional reaching/coordination as shown by improving time on box and blocks test by at least 5.   Baseline R-51 blocks, L-51 blocks   Time 8   Period Weeks   Status New   OT LONG TERM GOAL #5   Title Pt will demo at least 120* R shoulder flex with 3/10 pain or less.   Baseline 90* with 6/10 pain   Time 8   Period Weeks   Status New   Long Term Additional Goals   Additional Long Term Goals Yes   OT LONG TERM GOAL #6   Title Pt will demo at least 100* R shoulder abduction for ADLs.   Baseline 70* with pain   Time 8   Period Weeks   Status New               Plan - 04/30/15 1459    Clinical Impression Statement Pt demonstrates progress with improved right shoulder A/ROM and decreased pain.   Pt will benefit from skilled therapeutic intervention in order to improve on the following deficits (Retired) Pain;Impaired UE functional use;Decreased knowledge of use of DME;Decreased activity tolerance;Impaired tone;Decreased mobility;Decreased strength;Decreased coordination;Decreased range of motion;Improper body mechanics   Rehab Potential Good   Clinical Impairments Affecting Rehab Potential R shoulder pain   OT Frequency 2x /  week   OT Duration 8 weeks  OT Treatment/Interventions Moist Heat;Passive range of motion;Electrical Stimulation;Therapeutic activities;DME and/or AE instruction;Cryotherapy;Therapeutic exercises;Neuromuscular education;Ultrasound;Self-care/ADL training;Therapeutic exercise;Functional Mobility Training;Patient/family education;Manual Therapy   Plan ROM and trunk movements/ posture, issue PWR! twist in seated   OT Home Exercise Plan education provided:  initial HEP; 04/28/15 cane ex for shoulder abduction, ER, issued PWR! up, twist and rock in supine   Consulted and Agree with Plan of Care Patient        Problem List Patient Active Problem List   Diagnosis Date Noted  . Abnormal glucose 04/28/2015  . Globus sensation 04/24/2015  . Foot pain 02/25/2015  . Skin tear of forearm without complication 99/23/4144  . Skin lesion 12/10/2014  . Muscle pain 07/20/2014  . Shoulder pain 04/17/2014  . PD (Parkinson's disease) ([place]) 06/05/2013  . Tremor 05/18/2013  . Irritation of ear 05/18/2013  . Medicare annual wellness visit, subsequent 05/02/2012  . Neck pain 05/02/2012  . Osteoporosis 03/08/2011  . Neck mass 03/08/2011  . Breast cancer ([facility]) 11/19/2010  . GERD (gastroesophageal reflux disease) 08/01/2008  . DYSPHAGIA UNSPECIFIED 08/01/2008  . ORTHOSTATIC HYPOTENSION 06/28/2008  . Goiter 07/10/2007  . HLD (hyperlipidemia) 07/10/2007  . MONOCLONAL GAMMOPATHY 07/10/2007  . ANEMIA-NOS 07/10/2007  . FATIGUE 07/10/2007    [facility] 04/30/2015, 4:01 PM Theone Murdoch, OTR/L [phone] Phone: [phone] 4:01 PM 04/30/2015 [facility] [address] [place], [name], [address] Phone: [phone]   Fax:  [phone]  Name: DANAISHA CELLI MRN: [id number] Date of Birth: 26-Jul-1940

## 2015-05-05 ENCOUNTER — Ambulatory Visit: Payer: Medicare Other | Admitting: Occupational Therapy

## 2015-05-05 DIAGNOSIS — M25511 Pain in right shoulder: Secondary | ICD-10-CM | POA: Diagnosis not present

## 2015-05-05 DIAGNOSIS — R29818 Other symptoms and signs involving the nervous system: Secondary | ICD-10-CM

## 2015-05-05 DIAGNOSIS — R293 Abnormal posture: Secondary | ICD-10-CM

## 2015-05-05 DIAGNOSIS — R29898 Other symptoms and signs involving the musculoskeletal system: Secondary | ICD-10-CM

## 2015-05-05 DIAGNOSIS — M25611 Stiffness of right shoulder, not elsewhere classified: Secondary | ICD-10-CM

## 2015-05-05 NOTE — Therapy (Signed)
[facility] [address] [place], [name], [address] Phone: [phone]   Fax:  [phone]  Occupational Therapy Treatment  Patient Details  Name: [name] MRN: [id number] Date of Birth: Jun 22, 1940 Referring Provider: Dr. Wells Guiles Tat  Encounter Date: 05/05/2015      OT End of Session - 05/05/15 1504    Visit Number 8   Number of Visits 17   Date for OT Re-Evaluation 06/07/15   Authorization Type Medicare, BCBS (g-code needed)   Authorization - Visit Number 8   Authorization - Number of Visits 10   OT Start Time 1453   OT Stop Time 1532   OT Time Calculation (min) 39 min   Activity Tolerance Patient tolerated treatment well   Behavior During Therapy [facility] for tasks assessed/performed      Past Medical History  Diagnosis Date  . Esophageal Stricture   . Monoclonal gammopathy   . Anemia     Anemia-NOS / PMH, Dr Julien Nordmann  . Hyperlipidemia   . Vertical diplopia   . MVP (mitral valve prolapse)   . Breast cancer (HCC)     stage I right breast  . Skin cancer     basal cell L neck  . Allergy     SEASONAL  . Arthritis   . Cataract     BILATERAL  . Osteoporosis     Past Surgical History  Procedure Laterality Date  . Total abdominal hysterectomy w/ bilateral salpingoophorectomy  75 yrs old    for pain,prolapse ; G3 P2  . Salpingoophorectomy  2002    for benign growths  . Hemorrhoid surgery  1970's  . Appendectomy  2002  . Cardiac catheterization  2005    neg  . Colonoscopy      negative   . Bone biopsy  11/06/10  . Breast lumpectomy  02/2010    right breast lumpectomy and sentinel node biopsy  . Upper gastrointestinal endoscopy      There were no vitals filed for this visit.      Subjective Assessment - 05/05/15 1459    Subjective  Pt reports that the pain is better   Pertinent History hx of R breast CA with lumpectomy and lymph node removal and radiation 2012; multiple myeloma,  osteoporesis   Patient Stated Goals improve R shoulder pain   Currently in Pain? Yes   Pain Score 4   1-4/10   Pain Location Shoulder   Pain Orientation Right   Pain Descriptors / Indicators Aching   Pain Type Chronic pain   Aggravating Factors  use   Pain Relieving Factors rest         Treatment:   Shoulder flex AAROM with hemiglide, higher range noted today without pain  Shoulder flex with ball in supine and sitting with min v.c. For posture/positioning, elbow ext   Cane ex for shoulder abduction in supine and sitting with min v.c.   Arm bike x6 min level 1 for reciprocal movement (forward/backward)   PWR! Moves (up, rock, twist) in sitting, supine, modified quadraped x 10 each with min cues For incr movement amplitude, pain free/lower range, and positioning.   Check STGs and discussed progress--see below.                 OT Education - 05/05/15 1559    Education Details Pt to resume PWR! exercises in sitting, supine, modified quadraped (vs. quadraped) in low range/pain-free range (performed up, rock, twist in each position)  Person(s) Educated Patient   Methods Explanation;Demonstration;Handout;Verbal cues   Comprehension Verbalized understanding;Returned demonstration;Verbal cues required  min cueing for large amplitude movements          OT Short Term Goals - 05/05/15 1521    OT SHORT TERM GOAL #1   Title Pt will be independent with initial HEP--check STGs 05/08/15   Time 4   Period Weeks   Status Achieved  04/28/15    OT SHORT TERM GOAL #2   Title Pt will demo at least 105* R shoulder flex with 3/10 pain or less.   Baseline 90* with 6/10 pain   Time 4   Period Weeks   Status Achieved  05/05/15:  125* with 2/10 pain   OT SHORT TERM GOAL #3   Title Pt will report RUE pain less than or equal to 5/10 for ADLs.   Time 4   Period Weeks   Status Achieved  0-4/10   OT SHORT TERM GOAL #4   Title Pt will demo at least 85* R shoulder abduction  for ADLs.   Baseline 70*   Time 4   Period Weeks   Status Achieved  05/05/15:  90* with 4/10 pain           OT Long Term Goals - 05/05/15 1634    OT LONG TERM GOAL #1   Title Pt will verbalize understanding of updated strategies to increase ease, safety, and independence and decr pain with ADLs/IADLs prn.--check LTGs 06/07/15   Time 8   Period Weeks   Status New   OT LONG TERM GOAL #2   Title Pt will improve LUE functional reaching/coordination as shown by improving time on box and blocks test by at least 4.   Baseline R-51 blocks, L-51 blocks   Status New   OT LONG TERM GOAL #3   Title Pt will improve balance for ADLs/IADLs as shown by improving standing functional reach test by 2 inches bilaterally.   Baseline 8 inches bilaterally   Time 8   Period Weeks   Status New   OT LONG TERM GOAL #4   Title Pt will improve RUE functional reaching/coordination as shown by improving time on box and blocks test by at least 5.   Baseline R-51 blocks, L-51 blocks   Time 8   Period Weeks   Status New   OT LONG TERM GOAL #5   Title Pt will demo at least 135* R shoulder flex with 3/10 pain or less.   Baseline 90* with 6/10 pain   Time 8   Period Weeks   Status Revised  05/05/15 upgraded goal as pt met original goal.   OT LONG TERM GOAL #6   Title Pt will demo at least 100* R shoulder abduction for ADLs.   Baseline 70* with pain   Time 8   Period Weeks   Status New               Plan - 05/05/15 1633    Clinical Impression Statement Pt continues to progress with improved R shoulder ROM and pain.  All STGs met and LTG #5 upgraded.   Plan ROM and trunk movements/posture   OT Home Exercise Plan education provided:  initial HEP; 04/28/15 cane ex for shoulder abduction, ER, issued PWR! up, twist and rock in supine; 05/05/15 pt to resume PWR! moves in supine, sitting, and MODIFIED quadraped 05/05/15   Consulted and Agree with Plan of Care Patient      Patient will  benefit from  skilled therapeutic intervention in order to improve the following deficits and impairments:     Visit Diagnosis: Stiffness of right shoulder, not elsewhere classified  Pain in right shoulder  Abnormal posture  Other symptoms and signs involving the nervous system  Other symptoms and signs involving the musculoskeletal system    Problem List Patient Active Problem List   Diagnosis Date Noted  . Abnormal glucose 04/28/2015  . Globus sensation 04/24/2015  . Foot pain 02/25/2015  . Skin tear of forearm without complication 20/03/7942  . Skin lesion 12/10/2014  . Muscle pain 07/20/2014  . Shoulder pain 04/17/2014  . PD (Parkinson's disease) ([place]) 06/05/2013  . Tremor 05/18/2013  . Irritation of ear 05/18/2013  . Medicare annual wellness visit, subsequent 05/02/2012  . Neck pain 05/02/2012  . Osteoporosis 03/08/2011  . Neck mass 03/08/2011  . Breast cancer ([place]) 11/19/2010  . GERD (gastroesophageal reflux disease) 08/01/2008  . DYSPHAGIA UNSPECIFIED 08/01/2008  . ORTHOSTATIC HYPOTENSION 06/28/2008  . Goiter 07/10/2007  . HLD (hyperlipidemia) 07/10/2007  . MONOCLONAL GAMMOPATHY 07/10/2007  . ANEMIA-NOS 07/10/2007  . FATIGUE 07/10/2007    [facility] 05/05/2015, 4:36 PM  [facility] [address] [place], [name], [address] Phone: [phone]   Fax:  [phone]  Name: [name] MRN: [id number] Date of Birth: 01/26/1940  Vianne Bulls, OTR/L [facility] [address] [place], [place]  [address] [phone] phone [phone] 05/05/2015 4:36 PM

## 2015-05-07 ENCOUNTER — Ambulatory Visit: Payer: Medicare Other | Admitting: Occupational Therapy

## 2015-05-07 ENCOUNTER — Encounter (HOSPITAL_COMMUNITY): Payer: Self-pay | Admitting: Family Medicine

## 2015-05-07 ENCOUNTER — Telehealth: Payer: Self-pay | Admitting: Family Medicine

## 2015-05-07 ENCOUNTER — Emergency Department (HOSPITAL_COMMUNITY)
Admission: EM | Admit: 2015-05-07 | Discharge: 2015-05-07 | Disposition: A | Discharge status: Home / Self Care | Payer: Medicare Other | Attending: Emergency Medicine | Admitting: Emergency Medicine

## 2015-05-07 DIAGNOSIS — R519 Headache, unspecified: Secondary | ICD-10-CM

## 2015-05-07 DIAGNOSIS — D649 Anemia, unspecified: Secondary | ICD-10-CM | POA: Insufficient documentation

## 2015-05-07 DIAGNOSIS — Z853 Personal history of malignant neoplasm of breast: Secondary | ICD-10-CM | POA: Diagnosis not present

## 2015-05-07 DIAGNOSIS — H1132 Conjunctival hemorrhage, left eye: Secondary | ICD-10-CM | POA: Insufficient documentation

## 2015-05-07 DIAGNOSIS — R51 Headache: Secondary | ICD-10-CM | POA: Insufficient documentation

## 2015-05-07 DIAGNOSIS — Z8669 Personal history of other diseases of the nervous system and sense organs: Secondary | ICD-10-CM | POA: Diagnosis not present

## 2015-05-07 DIAGNOSIS — H538 Other visual disturbances: Secondary | ICD-10-CM | POA: Insufficient documentation

## 2015-05-07 DIAGNOSIS — R531 Weakness: Secondary | ICD-10-CM | POA: Diagnosis not present

## 2015-05-07 DIAGNOSIS — Z880 Allergy status to penicillin: Secondary | ICD-10-CM | POA: Insufficient documentation

## 2015-05-07 DIAGNOSIS — Z7982 Long term (current) use of aspirin: Secondary | ICD-10-CM | POA: Diagnosis not present

## 2015-05-07 DIAGNOSIS — Z79899 Other long term (current) drug therapy: Secondary | ICD-10-CM | POA: Insufficient documentation

## 2015-05-07 DIAGNOSIS — Z85828 Personal history of other malignant neoplasm of skin: Secondary | ICD-10-CM | POA: Insufficient documentation

## 2015-05-07 DIAGNOSIS — R4781 Slurred speech: Secondary | ICD-10-CM | POA: Insufficient documentation

## 2015-05-07 DIAGNOSIS — M810 Age-related osteoporosis without current pathological fracture: Secondary | ICD-10-CM | POA: Insufficient documentation

## 2015-05-07 DIAGNOSIS — M1990 Unspecified osteoarthritis, unspecified site: Secondary | ICD-10-CM | POA: Insufficient documentation

## 2015-05-07 MED ORDER — IBUPROFEN 800 MG PO TABS: 800.0000 mg | ORAL_TABLET | Freq: Three times a day (TID) | ORAL | Status: DC

## 2015-05-07 MED ORDER — FLUORESCEIN SODIUM 1 MG OP STRP
1.0000 | ORAL_STRIP | Freq: Once | OPHTHALMIC | Status: AC
  Administered 2015-05-07: 1 via OPHTHALMIC
  Filled 2015-05-07: qty 1

## 2015-05-07 MED ORDER — PROPARACAINE HCL 0.5 % OP SOLN
1.0000 [drp] | Freq: Once | OPHTHALMIC | Status: AC
  Administered 2015-05-07: 1 [drp] via OPHTHALMIC
  Filled 2015-05-07: qty 15

## 2015-05-07 MED ORDER — IBUPROFEN 400 MG PO TABS
800.0000 mg | ORAL_TABLET | Freq: Once | ORAL | Status: AC
  Administered 2015-05-07: 800 mg via ORAL
  Filled 2015-05-07: qty 2

## 2015-05-07 NOTE — Telephone Encounter (Signed)
If this is a 10/10 headache, ie the worse in her life, or if any neuro changes, then she needs eval ASAP, ER or o/w and not to wait until tomorrow. Thanks.

## 2015-05-07 NOTE — ED Notes (Signed)
Per Gerald Stabs, RN per Winfred Leeds, MD patient needs to be seen in eye room; Luellen Pucker, RN notified

## 2015-05-07 NOTE — ED Provider Notes (Signed)
CSN: [id number]     Arrival date & time 05/07/15  1245 History  By signing my name below, I, Randa Evens, attest that this documentation has been prepared under the direction and in the presence of [employer], PA-C. Electronically Signed: Randa Evens, ED Scribe. 05/07/2015. 5:24 PM.     Chief Complaint  Patient presents with  . Eye Pain  . Headache   The history is provided by the patient. No language interpreter was used.   HPI Comments: [name] is a 75 y.o. female who presents to the Emergency Department complaining of left eye redness and headache. She reports 1 day of left eye redness.  No drainage, eye pain, visual disturbance, photophobia.  She states her eye seems a bit watery and causes some blurry vision at times.  She does not wear contacts, but does wear glasses.  No trauma/injury.  No prior treatment. Pt reports waking up with a gradual onset, constant, moderate frontal HA this morning. No trauma/injury. Denies thunderclap.  She reports history of similar headaches. She tried tylenol with mild relief.    Pt denies eye pain, fever, chills, neck stiffness, numbness, weakness, speech difficulty or facial asymmetry. Denies recent head injury or trauma. Pt reports that she has a HX of loss of vision in her right eye. Pt also reports HX of similar HA's.   Past Medical History  Diagnosis Date  . Esophageal Stricture   . Monoclonal gammopathy   . Anemia     Anemia-NOS / PMH, Dr Julien Nordmann  . Hyperlipidemia   . Vertical diplopia   . MVP (mitral valve prolapse)   . Breast cancer (HCC)     stage I right breast  . Skin cancer     basal cell L neck  . Allergy     SEASONAL  . Arthritis   . Cataract     BILATERAL  . Osteoporosis    Past Surgical History  Procedure Laterality Date  . Total abdominal hysterectomy w/ bilateral salpingoophorectomy  75 yrs old    for pain,prolapse ; G3 P2  . Salpingoophorectomy  2002    for benign growths  . Hemorrhoid surgery   1970's  . Appendectomy  2002  . Cardiac catheterization  2005    neg  . Colonoscopy      negative   . Bone biopsy  11/06/10  . Breast lumpectomy  02/2010    right breast lumpectomy and sentinel node biopsy  . Upper gastrointestinal endoscopy     Family History  Problem Relation Age of Onset  . Diabetes Mother   . Heart disease Mother   . Diabetes Brother   . Heart disease Brother   . Hepatitis Father   . Breast cancer Neg Hx   . Colon cancer Neg Hx   . Diabetes Brother   . Diabetes Brother    Social History  Substance Use Topics  . Smoking status: Never Smoker   . Smokeless tobacco: Never Used  . Alcohol Use: No   OB History    No data available      Review of Systems  Constitutional: Negative for fever and chills.  Eyes: Positive for redness. Negative for pain.  Neurological: Positive for headaches. Negative for facial asymmetry, speech difficulty, weakness and numbness.  All other systems reviewed and are negative.    Allergies  Penicillins  Home Medications   Prior to Admission medications   Medication Sig Start Date End Date Taking? Authorizing Provider  aspirin 81 MG  tablet Take 81 mg by mouth daily.      Historical Provider, MD  b complex vitamins tablet Take 1 tablet by mouth daily.    Historical Provider, MD  Calcium 1500 MG tablet Take 1,500 mg by mouth.      Historical Provider, MD  carbidopa-levodopa (SINEMET IR) 25-100 MG tablet Take 1 tablet by mouth 3 (three) times daily. 03/26/15   Eustace Quail Tat, DO  cholecalciferol (VITAMIN D) 1000 UNITS tablet Take 5,000 Units by mouth daily.      Historical Provider, MD  Denosumab (PROLIA [place]) Inject into the skin every 6 (six) months.    Historical Provider, MD  Multiple Vitamin (MULTIVITAMIN) capsule 2 tabs po qd     Historical Provider, MD  nitroGLYCERIN (NITROSTAT) 0.3 MG SL tablet Place 1 tablet (0.3 mg total) under the tongue every 5 (five) minutes as needed (max 3 doses). Reported on 04/07/2015 04/28/15    Tonia Ghent, MD  Omega-3 Fatty Acids (OMEGA 3 PO) 1 tab po qd     Historical Provider, MD  omeprazole (PRILOSEC OTC) 20 MG tablet Take 1 tablet (20 mg total) by mouth daily. 04/28/15   Tonia Ghent, MD  tamoxifen (NOLVADEX) 20 MG tablet TAKE ONE TABLET BY MOUTH ONCE DAILY 04/22/15   Curt Bears, MD  traMADol (ULTRAM) 50 MG tablet TAKE ONE TO TWO TABLETS BY MOUTH AT BEDTIME AS NEEDED FOR PAIN 10/01/14   Tonia Ghent, MD  venlafaxine XR (EFFEXOR XR) 75 MG 24 hr capsule Take 1 capsule (75 mg total) by mouth daily with breakfast. 04/22/15   Eustace Quail Tat, DO  vitamin B-12 (CYANOCOBALAMIN) 500 MCG tablet Take 2500 mcg daily.    Historical Provider, MD   BP 140/68 mmHg  Pulse 80  Temp(Src) 98.6 F (37 C) (Oral)  Resp 18  Ht 5\' 5"  (1.651 m)  Wt 56.926 kg  BMI 20.88 kg/m2  SpO2 99%   Physical Exam  Constitutional: She is oriented to person, place, and time. She appears well-developed and well-nourished.  Non-toxic appearance. She does not have a sickly appearance. She does not appear ill.  HENT:  Head: Normocephalic and atraumatic.  Right Ear: External ear normal.  Left Ear: External ear normal.  Mouth/Throat: Oropharynx is clear and moist.  Eyes: EOM and lids are normal. Pupils are equal, round, and reactive to light. Lids are everted and swept, no foreign bodies found. Right eye exhibits no chemosis, no discharge, no exudate and no hordeolum. No foreign body present in the right eye. Left eye exhibits no chemosis, no discharge, no exudate and no hordeolum. No foreign body present in the left eye. Right conjunctiva is not injected. Right conjunctiva has no hemorrhage. Left conjunctiva is not injected. Left conjunctiva has a hemorrhage (small conjunctival hemorrhage at base of globe). No scleral icterus.    Visual Acuity  Right Eye Distance:  (limited vision since birth ) Left Eye Distance: 20/40 Bilateral Distance: 20/40  IOP OD: 15       Neck: Normal range of motion. Neck  supple. No tracheal deviation present.  No nuchal rigidity. No cervical midline or paraspinal tenderness.   Cardiovascular: Normal rate, regular rhythm and normal heart sounds.   No murmur heard. Pulmonary/Chest: Effort normal and breath sounds normal. No accessory muscle usage or stridor. No respiratory distress. She has no wheezes. She has no rhonchi. She has no rales.  Abdominal: Soft. Bowel sounds are normal. She exhibits no distension. There is no tenderness.  Musculoskeletal: Normal  range of motion.  Lymphadenopathy:    She has no cervical adenopathy.  Neurological: She is alert and oriented to person, place, and time.  Mental Status:   AOx3.  Speech clear without dysarthria. Cranial Nerves:  I-not tested  II-PERRLA  III, IV, VI-EOMs intact  V-temporal and masseter strength intact  VII-symmetrical facial movements intact, no facial droop  VIII-hearing grossly intact bilaterally  IX, X-gag intact  XI-strength of sternomastoid and trapezius muscles 5/5  XII-tongue midline Motor:   Good muscle bulk and tone  Strength 5/5 bilaterally in upper and lower extremities   Cerebellar--intact RAMs, finger to nose intact bilaterally.  Gait normal  No pronator drift Sensory:  Intact in upper and lower extremities   Skin: Skin is warm and dry.  Psychiatric: She has a normal mood and affect. Her behavior is normal.  Nursing note and vitals reviewed.   ED Course  Procedures (including critical care time) DIAGNOSTIC STUDIES: Oxygen Saturation is 99% on RA, normal by my interpretation.    COORDINATION OF CARE: 5:24 PM-Discussed treatment plan with pt at bedside and pt agreed to plan.     Labs Review Labs Reviewed - No data to display  Imaging Review No results found.    EKG Interpretation None      MDM   Final diagnoses:  Conjunctival hemorrhage, left  Nonintractable headache, unspecified chronicity pattern, unspecified headache type   Patient presents with left eye  redness and gradual onset, frontal headache.  Similar to prior headaches. No focal weakness, slurred speech, or disorientation.  No head or eye trauma. No headache red flags. On exam, appears to be conjunctival hemorrhage.  No pain with EOM movement.  No eye pain. Normal vision.  IOP OD 15.   Normal neuro exam.  No nuchal rigidity.  Doubt CVA, SAH, meningitis, intracranial mass, preseptal or orbital cellulitis, acute angle glaucoma.  Patient given ibuprofen in ED with improvement.  Discharged home with ibuprofen.  Follow up with her optometrist and PCP in 2 days.  Discussed return precautions.  Patient agrees and acknowledges the above plan for discharge.   Case has been discussed with Dr. Dayna Barker who agrees with the above plan for discharge.   I personally performed the services described in this documentation, which was scribed in my presence. The recorded information has been reviewed and is accurate.      Gloriann Loan, PA-C 05/07/15 1940  Merrily Pew, MD 05/10/15 1240

## 2015-05-07 NOTE — Telephone Encounter (Signed)
PLEASE NOTE: All timestamps contained within this report are represented as [place] Standard Time. CONFIDENTIALTY NOTICE: This fax transmission is intended only for the addressee. It contains information that is legally privileged, confidential or otherwise protected from use or disclosure. If you are not the intended recipient, you are strictly prohibited from reviewing, disclosing, copying using or disseminating any of this information or taking any action in reliance on or regarding this information. If you have received this fax in error, please notify us immediately by telephone so that we can arrange for its return to [place]. Phone: [phone], Toll-Free: [phone], Fax: [phone] Page: 1 of 1 Call Id: [id number] [place] Patient Name: [name] DOB: [birth year] Initial Comment Caller States the white in her eye is red with darks spots in the corners, hurts and headache Nurse Assessment Nurse: Marcelline Deist, RN, Kermit Balo Date/Time (Eastern Time): 05/07/2015 11:09:37 AM Confirm and document reason for call. If symptomatic, describe symptoms. You must click the next button to save text entered. ---Caller states the white in her left eye is red with darks spots in the corners,. She has a bad headache - 10/10. The headache came on gradually. Has the patient traveled out of the country within the last 30 days? ---Not Applicable Does the patient have any new or worsening symptoms? ---Yes Will a triage be completed? ---Yes Related visit to physician within the last 2 weeks? ---No Does the PT have any chronic conditions? (i.e. diabetes, asthma, etc.) ---Yes List chronic conditions. ---rx for her stomach, breast cancer, Parkinson's Is this a behavioral health or substance abuse call? ---No Guidelines Guideline Title Affirmed Question Affirmed Notes Headache [1] SEVERE headache (e.g.,  excruciating) AND [2] "worst headache" of life Final Disposition User Go to ED Now (or PCP triage) Marcelline Deist, RN, Lynda Comments Caller states she has a therapy appt. this afternoon & lives an hour from the office. If Dr. [name] has cancellations this evening, please call her. Otherwise, nurse scheduled her for tomorrow am. Referrals REFERRED TO PCP OFFICE Disagree/Comply: Comply

## 2015-05-07 NOTE — ED Notes (Signed)
Dr j wants this pt placed in the eye room in fast track.  Triage notified

## 2015-05-07 NOTE — ED Notes (Signed)
Pt here for left eye redness that started yesterday. sts blurred vision in that eye. sts today headache.

## 2015-05-07 NOTE — Telephone Encounter (Signed)
Pt has appt with Dr [name] on 05/08/15 at 10:30; gave cancellation note to Morey Hummingbird if any cancellations for 05/07/15.

## 2015-05-07 NOTE — Discharge Instructions (Signed)
Subconjunctival Hemorrhage Subconjunctival hemorrhage is bleeding that happens between the white part of your eye (sclera) and the clear membrane that covers the outside of your eye (conjunctiva). There are many tiny blood vessels near the surface of your eye. A subconjunctival hemorrhage happens when one or more of these vessels breaks and bleeds, causing a red patch to appear on your eye. This is similar to a bruise. Depending on the amount of bleeding, the red patch may only cover a small area of your eye or it may cover the entire visible part of the sclera. If a lot of blood collects under the conjunctiva, there may also be swelling. Subconjunctival hemorrhages do not affect your vision or cause pain, but your eye may feel irritated if there is swelling. Subconjunctival hemorrhages usually do not require treatment, and they disappear on their own within two weeks. CAUSES This condition may be caused by:  Mild trauma, such as rubbing your eye too hard.  Severe trauma or blunt injuries.  Coughing, sneezing, or vomiting.  Straining, such as when lifting a heavy object.  High blood pressure.  Recent eye surgery.  A history of diabetes.  Certain medicines, especially blood thinners (anticoagulants).  Other conditions, such as eye tumors, bleeding disorders, or blood vessel abnormalities. Subconjunctival hemorrhages can happen without an obvious cause.  SYMPTOMS  Symptoms of this condition include:  A bright red or dark red patch on the white part of the eye.  The red area may spread out to cover a larger area of the eye before it goes away.  The red area may turn brownish-yellow before it goes away.  Swelling.  Mild eye irritation. DIAGNOSIS This condition is diagnosed with a physical exam. If your subconjunctival hemorrhage was caused by trauma, your health care provider may refer you to an eye specialist (ophthalmologist) or another specialist to check for other injuries. You  may have other tests, including:  An eye exam.  A blood pressure check.  Blood tests to check for bleeding disorders. If your subconjunctival hemorrhage was caused by trauma, X-rays or a CT scan may be done to check for other injuries. TREATMENT Usually, no treatment is needed. Your health care provider may recommend eye drops or cold compresses to help with discomfort. HOME CARE INSTRUCTIONS  Take over-the-counter and prescription medicines only as directed by your health care provider.  Use eye drops or cold compresses to help with discomfort as directed by your health care provider.  Avoid activities, things, and environments that may irritate or injure your eye.  Keep all follow-up visits as told by your health care provider. This is important. SEEK MEDICAL CARE IF:  You have pain in your eye.  The bleeding does not go away within 3 weeks.  You keep getting new subconjunctival hemorrhages. SEEK IMMEDIATE MEDICAL CARE IF:  Your vision changes or you have difficulty seeing.  You suddenly develop severe sensitivity to light.  You develop a severe headache, persistent vomiting, confusion, or abnormal tiredness (lethargy).  Your eye seems to bulge or protrude from your eye socket.  You develop unexplained bruises on your body.  You have unexplained bleeding in another area of your body.   This information is not intended to replace advice given to you by your health care provider. Make sure you discuss any questions you have with your health care provider.   Document Released: 01/11/2005 Document Revised: 10/02/2014 Document Reviewed: 03/20/2014 Elsevier Interactive Patient Education 2016 [employer] Headache Without Cause  A headache is pain or discomfort felt around the head or neck area. The specific cause of a headache may not be found. There are many causes and types of headaches. A few common ones are:  Tension headaches.  Migraine headaches.    Cluster headaches.  Chronic daily headaches. HOME CARE INSTRUCTIONS  Watch your condition for any changes. Take these steps to help with your condition:  Managing Pain  Take over-the-counter and prescription medicines only as told by your health care provider.  Lie down in a dark, quiet room when you have a headache.  If directed, apply ice to the head and neck area:  Put ice in a plastic bag.  Place a towel between your skin and the bag.  Leave the ice on for 20 minutes, 2-3 times per day. Use a heating pad or hot shower to apply heat to the head and neck area as told by your health care provider.  Keep lights dim if bright lights bother you or make your headaches worse. Eating and Drinking  Eat meals on a regular schedule.  Limit alcohol use.  Decrease the amount of caffeine you drink, or stop drinking caffeine. General Instructions  Keep all follow-up visits as told by your health care provider. This is important.  Keep a headache journal to help find out what may trigger your headaches. For example, write down:  What you eat and drink.  How much sleep you get.  Any change to your diet or medicines. Try massage or other relaxation techniques.  Limit stress.  Sit up straight, and do not tense your muscles.  Do not use tobacco products, including cigarettes, chewing tobacco, or e-cigarettes. If you need help quitting, ask your health care provider.  Exercise regularly as told by your health care provider.  Sleep on a regular schedule. Get 7-9 hours of sleep, or the amount recommended by your health care provider. SEEK MEDICAL CARE IF:  Your symptoms are not helped by medicine.  You have a headache that is different from the usual headache.  You have nausea or you vomit.  You have a fever. SEEK IMMEDIATE MEDICAL CARE IF:  Your headache becomes severe.  You have repeated vomiting.  You have a stiff neck.  You have a loss of vision.  You have problems with speech.  You have  pain in the eye or ear.  You have muscular weakness or loss of muscle control.  You lose your balance or have trouble walking.  You feel faint or pass out.  You have confusion. This information is not intended to replace advice given to you by your health care provider. Make sure you discuss any questions you have with your health care provider.  Document Released: 01/11/2005 Document Revised: 10/02/2014 Document Reviewed: 05/06/2014  Elsevier Interactive Patient Education [employer].

## 2015-05-07 NOTE — Telephone Encounter (Signed)
Thanks

## 2015-05-07 NOTE — Telephone Encounter (Signed)
I spoke with [name] and she still has 10/10 h/a and blurred vision; no weakness in arms and legs. [name] is on her way to [name]; advised [name] not to go to [name] and go to ED. [name] will go to [facility] ED now; [name] will keep 05/08/15 appt with Dr Osie Bond needs f/u in 1-2 days after ED visit; if no f/u needed [name] will call and cancel 05/08/15 appt. FYI to Dr [name].

## 2015-05-08 ENCOUNTER — Ambulatory Visit (INDEPENDENT_AMBULATORY_CARE_PROVIDER_SITE_OTHER): Payer: Medicare Other | Admitting: Family Medicine

## 2015-05-08 ENCOUNTER — Other Ambulatory Visit: Payer: Self-pay | Admitting: Family Medicine

## 2015-05-08 ENCOUNTER — Encounter: Payer: Self-pay | Admitting: Family Medicine

## 2015-05-08 VITALS — BP 112/62 | HR 85 | Temp 98.3°F | Wt 123.5 lb

## 2015-05-08 DIAGNOSIS — R519 Headache, unspecified: Secondary | ICD-10-CM

## 2015-05-08 DIAGNOSIS — R51 Headache: Secondary | ICD-10-CM | POA: Diagnosis not present

## 2015-05-08 MED ORDER — FLUTICASONE PROPIONATE 50 MCG/ACT NA SUSP: 2.0000 | Freq: Every day | NASAL | Status: DC

## 2015-05-08 MED ORDER — CYCLOBENZAPRINE HCL 5 MG PO TABS
5.0000 mg | ORAL_TABLET | Freq: Every evening | ORAL | Status: DC | PRN
Start: 2015-05-08 — End: 2015-09-09

## 2015-05-08 NOTE — Telephone Encounter (Signed)
Medication phoned to pharmacy.  

## 2015-05-08 NOTE — Addendum Note (Signed)
Addended by: Vianne Bulls D on: 05/08/2015 01:21 PM   Modules accepted: Orders

## 2015-05-08 NOTE — Telephone Encounter (Signed)
Please call in.  Thanks.   

## 2015-05-08 NOTE — Telephone Encounter (Signed)
Electronic refill request.  Last Filled:    60 tablet 5 10/01/2014  Seen in the office today.  Please advise.

## 2015-05-08 NOTE — Patient Instructions (Signed)
I would try adding on flonase in the meantime to see if that helps the headaches.  If not better, then okay to try flexeril at night.  I would start with only 5mg .  It can make you drowsy.  Take care.  Glad to see you.

## 2015-05-08 NOTE — Progress Notes (Signed)
Pre visit review using our clinic review tool, if applicable. No additional management support is needed unless otherwise documented below in the visit note.  HA and ER f/u.  HA started about 2 days ago.  She had L subconjunctival hemorrhage.  Dec vision in R eye at baseline, longstanding.    HA currently frontal and at the B temples.  Not better or worse now compared to baseline. Took ibuprofen with some mild relief.  Lights don't bother patient.   She doesn't have tears running down the face but has a feeling of more watering.  The eye itself doesn't hurt.   No vomiting, no diarrhea.  No fevers.   She has h/o HA in the past, but not a lot recently.   8/10 pain per patient report but sitting in NAD.   No runny nose, no ST.   She has had more trouble with allergies in the last few years and has h/o "sinus troubles."   Tramadol helped her neck pain but not much for the HA.    Her nausea is better on PPI.  She attributed the improvement to the PPI.    Meds, vitals, and allergies reviewed.   ROS: See HPI.  Otherwise, noncontributory.  GEN: nad, alert and oriented HEENT: mucous membranes moist, TM wnl, rhinorrhea noted- clear. Resolving L subconjunctival hemorrhage noted.  Temples not ttp NECK: supple w/o LA CV: rrr.  PULM: ctab, no inc wob ABD: soft, +bs EXT: no edema SKIN: no acute rash Minimal tremor at baseline.

## 2015-05-11 DIAGNOSIS — R51 Headache: Secondary | ICD-10-CM

## 2015-05-11 DIAGNOSIS — R519 Headache, unspecified: Secondary | ICD-10-CM | POA: Insufficient documentation

## 2015-05-11 NOTE — Assessment & Plan Note (Addendum)
The subconjunctival change is likely incidental.  BP wnl today.  Already with nsaid trial.  No emergent sx or findings now on exam.  I would try adding on flonase in the meantime to see if that helps the headaches and the rhinorrhea.  If not better, then okay to try flexeril at night. I would start with only 5mg . Sedation caution.  >25 minutes spent in face to face time with patient, >50% spent in counselling or coordination of care.

## 2015-05-12 ENCOUNTER — Encounter: Payer: Self-pay | Admitting: Occupational Therapy

## 2015-05-12 ENCOUNTER — Ambulatory Visit: Payer: Medicare Other | Admitting: Occupational Therapy

## 2015-05-12 DIAGNOSIS — R29898 Other symptoms and signs involving the musculoskeletal system: Secondary | ICD-10-CM | POA: Diagnosis not present

## 2015-05-12 DIAGNOSIS — M25611 Stiffness of right shoulder, not elsewhere classified: Secondary | ICD-10-CM | POA: Diagnosis not present

## 2015-05-12 DIAGNOSIS — R29818 Other symptoms and signs involving the nervous system: Secondary | ICD-10-CM

## 2015-05-12 DIAGNOSIS — M25511 Pain in right shoulder: Secondary | ICD-10-CM | POA: Diagnosis not present

## 2015-05-12 DIAGNOSIS — R293 Abnormal posture: Secondary | ICD-10-CM | POA: Diagnosis not present

## 2015-05-12 NOTE — Therapy (Signed)
[facility] [address] [place], [name], [address] Phone: [phone]   Fax:  [phone]  Occupational Therapy Treatment  Patient Details  Name: [name] MRN: [id number] Date of Birth: February 16, 1940 Referring Provider: Dr. Wells Guiles Tat  Encounter Date: 05/12/2015      OT End of Session - 05/12/15 1529    Visit Number 9   Number of Visits 17   Date for OT Re-Evaluation 06/07/15   Authorization Type Medicare, BCBS (g-code needed)   Authorization Time Period re-cert 05/28/62-07/02/01*   Authorization - Visit Number 9   Authorization - Number of Visits 10   OT Start Time 1455   OT Stop Time 1533   OT Time Calculation (min) 38 min   Activity Tolerance Patient tolerated treatment well   Behavior During Therapy [facility] for tasks assessed/performed      Past Medical History  Diagnosis Date  . Esophageal Stricture   . Monoclonal gammopathy   . Anemia     Anemia-NOS / PMH, Dr Julien Nordmann  . Hyperlipidemia   . Vertical diplopia   . MVP (mitral valve prolapse)   . Breast cancer (HCC)     stage I right breast  . Skin cancer     basal cell L neck  . Allergy     SEASONAL  . Arthritis   . Cataract     BILATERAL  . Osteoporosis   . Decreased vision     R eye    Past Surgical History  Procedure Laterality Date  . Total abdominal hysterectomy w/ bilateral salpingoophorectomy  75 yrs old    for pain,prolapse ; G3 P2  . Salpingoophorectomy  2002    for benign growths  . Hemorrhoid surgery  1970's  . Appendectomy  2002  . Cardiac catheterization  2005    neg  . Colonoscopy      negative   . Bone biopsy  11/06/10  . Breast lumpectomy  02/2010    right breast lumpectomy and sentinel node biopsy  . Upper gastrointestinal endoscopy      There were no vitals filed for this visit.      Subjective Assessment - 05/12/15 1517    Subjective  Pt reports that she went to ED last week with headach and visual changes,  but "all the bad stuff was ruled out"   Pertinent History hx of R breast CA with lumpectomy and lymph node removal and radiation 2012; multiple myeloma, osteoporesis   Patient Stated Goals improve R shoulder pain   Currently in Pain? Yes   Pain Score 3    Pain Location Shoulder   Pain Orientation Right   Pain Descriptors / Indicators Aching   Pain Type Chronic pain   Aggravating Factors  high reaching   Pain Relieving Factors rest           Neuro re-ed:  PWR! Moves (up, rock, twist) in supine and modified quadraped x 10 each with min cues For incr movement amplitude.  Arm bike x5 min level 1 for reciprocal movement (forward/backwards).  Shoulder flex with ball in supine and sitting with min v.c. For posture/positioning, elbow ext   Cane ex for shoulder abduction in supine and sitting with min v.c., higher range noted today  In standing, shoulder flex with BUEs with ball along wall with min cues.  Functional reaching overhead with RUE incorporating wt. Shift and trunk rotation (laterally and across body) and then with LUE with body on arm movements while  wt. Bearing through [place] with min v.c.                        OT Short Term Goals - 05/05/15 1521    OT SHORT TERM GOAL #1   Title Pt will be independent with initial HEP--check STGs 05/08/15   Time 4   Period Weeks   Status Achieved  04/28/15    OT SHORT TERM GOAL #2   Title Pt will demo at least 105* R shoulder flex with 3/10 pain or less.   Baseline 90* with 6/10 pain   Time 4   Period Weeks   Status Achieved  05/05/15:  125* with 2/10 pain   OT SHORT TERM GOAL #3   Title Pt will report RUE pain less than or equal to 5/10 for ADLs.   Time 4   Period Weeks   Status Achieved  0-4/10   OT SHORT TERM GOAL #4   Title Pt will demo at least 85* R shoulder abduction for ADLs.   Baseline 70*   Time 4   Period Weeks   Status Achieved  05/05/15:  90* with 4/10 pain           OT Long Term Goals -  05/05/15 1634    OT LONG TERM GOAL #1   Title Pt will verbalize understanding of updated strategies to increase ease, safety, and independence and decr pain with ADLs/IADLs prn.--check LTGs 06/07/15   Time 8   Period Weeks   Status New   OT LONG TERM GOAL #2   Title Pt will improve LUE functional reaching/coordination as shown by improving time on box and blocks test by at least 4.   Baseline R-51 blocks, L-51 blocks   Status New   OT LONG TERM GOAL #3   Title Pt will improve balance for ADLs/IADLs as shown by improving standing functional reach test by 2 inches bilaterally.   Baseline 8 inches bilaterally   Time 8   Period Weeks   Status New   OT LONG TERM GOAL #4   Title Pt will improve RUE functional reaching/coordination as shown by improving time on box and blocks test by at least 5.   Baseline R-51 blocks, L-51 blocks   Time 8   Period Weeks   Status New   OT LONG TERM GOAL #5   Title Pt will demo at least 135* R shoulder flex with 3/10 pain or less.   Baseline 90* with 6/10 pain   Time 8   Period Weeks   Status Revised  05/05/15 upgraded goal as pt met original goal.   OT LONG TERM GOAL #6   Title Pt will demo at least 100* R shoulder abduction for ADLs.   Baseline 70* with pain   Time 8   Period Weeks   Status New               Plan - 05/12/15 1530    Clinical Impression Statement Pt continues to progress with improved R shoulder ROM and pain.     Plan G-code next visit; trunk movements/posture, ROM   OT Home Exercise Plan education provided:  initial HEP; 04/28/15 cane ex for shoulder abduction, ER, issued PWR! up, twist and rock in supine; 05/05/15 pt to resume PWR! moves in supine, sitting, and MODIFIED quadraped 05/05/15   Consulted and Agree with Plan of Care Patient      Patient will benefit from skilled therapeutic intervention in  order to improve the following deficits and impairments:     Visit Diagnosis: Stiffness of right shoulder, not elsewhere  classified  Pain in right shoulder  Abnormal posture  Other symptoms and signs involving the nervous system  Other symptoms and signs involving the musculoskeletal system    Problem List Patient Active Problem List   Diagnosis Date Noted  . Headache 05/11/2015  . Abnormal glucose 04/28/2015  . Globus sensation 04/24/2015  . Foot pain 02/25/2015  . Skin tear of forearm without complication 48/54/6270  . Skin lesion 12/10/2014  . Muscle pain 07/20/2014  . Shoulder pain 04/17/2014  . PD (Parkinson's disease) ([place]) 06/05/2013  . Tremor 05/18/2013  . Irritation of ear 05/18/2013  . Medicare annual wellness visit, subsequent 05/02/2012  . Neck pain 05/02/2012  . Osteoporosis 03/08/2011  . Neck mass 03/08/2011  . Breast cancer ([place]) 11/19/2010  . GERD (gastroesophageal reflux disease) 08/01/2008  . DYSPHAGIA UNSPECIFIED 08/01/2008  . ORTHOSTATIC HYPOTENSION 06/28/2008  . Goiter 07/10/2007  . HLD (hyperlipidemia) 07/10/2007  . MONOCLONAL GAMMOPATHY 07/10/2007  . ANEMIA-NOS 07/10/2007  . FATIGUE 07/10/2007    [facility] 05/12/2015, 4:53 PM  [facility] [address] [place], [name], [address] Phone: [phone]   Fax:  [phone]  Name: [name] MRN: [id number] Date of Birth: October 20, 1940  Vianne Bulls, OTR/L [facility] [address] [place], [place]  [address] [phone] phone [phone] 05/12/2015 4:54 PM

## 2015-05-14 DIAGNOSIS — H04123 Dry eye syndrome of bilateral lacrimal glands: Secondary | ICD-10-CM | POA: Diagnosis not present

## 2015-05-14 DIAGNOSIS — H2513 Age-related nuclear cataract, bilateral: Secondary | ICD-10-CM | POA: Diagnosis not present

## 2015-05-15 ENCOUNTER — Ambulatory Visit: Payer: Medicare Other | Admitting: Occupational Therapy

## 2015-05-15 DIAGNOSIS — R29898 Other symptoms and signs involving the musculoskeletal system: Secondary | ICD-10-CM

## 2015-05-15 DIAGNOSIS — M25611 Stiffness of right shoulder, not elsewhere classified: Secondary | ICD-10-CM | POA: Diagnosis not present

## 2015-05-15 DIAGNOSIS — M25511 Pain in right shoulder: Secondary | ICD-10-CM | POA: Diagnosis not present

## 2015-05-15 DIAGNOSIS — R293 Abnormal posture: Secondary | ICD-10-CM | POA: Diagnosis not present

## 2015-05-15 DIAGNOSIS — R29818 Other symptoms and signs involving the nervous system: Secondary | ICD-10-CM

## 2015-05-15 NOTE — Therapy (Signed)
[facility] [address] [place], [name], [address] Phone: [phone]   Fax:  [phone]  Occupational Therapy Treatment  Patient Details  Name: [name] MRN: [id number] Date of Birth: July 27, 1940 Referring Provider: Dr. Wells Guiles Tat  Encounter Date: 05/15/2015      OT End of Session - 05/15/15 1541    Visit Number 10   Number of Visits 17   Date for OT Re-Evaluation 06/07/15   Authorization Type Medicare, BCBS (g-code needed)   Authorization Time Period re-cert 05/02/25-0/7/86*   Authorization - Visit Number 10   Authorization - Number of Visits 10   OT Start Time 7544   OT Stop Time 1533   OT Time Calculation (min) 39 min   Activity Tolerance Patient tolerated treatment well   Behavior During Therapy [facility] for tasks assessed/performed      Past Medical History  Diagnosis Date  . Esophageal Stricture   . Monoclonal gammopathy   . Anemia     Anemia-NOS / PMH, Dr Julien Nordmann  . Hyperlipidemia   . Vertical diplopia   . MVP (mitral valve prolapse)   . Breast cancer (HCC)     stage I right breast  . Skin cancer     basal cell L neck  . Allergy     SEASONAL  . Arthritis   . Cataract     BILATERAL  . Osteoporosis   . Decreased vision     R eye    Past Surgical History  Procedure Laterality Date  . Total abdominal hysterectomy w/ bilateral salpingoophorectomy  75 yrs old    for pain,prolapse ; G3 P2  . Salpingoophorectomy  2002    for benign growths  . Hemorrhoid surgery  1970's  . Appendectomy  2002  . Cardiac catheterization  2005    neg  . Colonoscopy      negative   . Bone biopsy  11/06/10  . Breast lumpectomy  02/2010    right breast lumpectomy and sentinel node biopsy  . Upper gastrointestinal endoscopy      There were no vitals filed for this visit.      Subjective Assessment - 05/15/15 1501    Subjective  Pt reports that she went to eye MD yesterday and everything "was back to  normal".  "If I'm resting, it doesn't hurt anymore.  It was hurting all the time"   Pertinent History hx of R breast CA with lumpectomy and lymph node removal and radiation 2012; multiple myeloma, osteoporesis   Patient Stated Goals improve R shoulder pain   Currently in Pain? Yes   Pain Score 1   0-3/10   Pain Location Shoulder   Pain Orientation Right   Pain Descriptors / Indicators Aching   Pain Type Chronic pain   Pain Onset More than a month ago   Pain Frequency Intermittent   Aggravating Factors  high reaching in flex/abduction   Pain Relieving Factors rest          Neuro re-ed:  PWR! Moves (up, rock, twist) in supine and sitting and  PWR! Moves in modified quadraped (up, rock) x 10-20 each with min cues For incr movement amplitude (hands).  Shoulder flex with PVC frame in supine and with min v.c. For posture/positioning, elbow ext   Shoulder flex with 1lb ball in sitting to 90* and with min v.c. For posture/positioning, elbow ext   In standing, shoulder flex with BUEs with ball along wall with min cues.  Functional reaching laterally and overhead with RUE incorporating wt. Shift and trunk rotation (laterally and across body)  Arm bike x10mn level 1 for reciprocal movement (forward/backwards).                     OT Short Term Goals - 05/05/15 1521    OT SHORT TERM GOAL #1   Title Pt will be independent with initial HEP--check STGs 05/08/15   Time 4   Period Weeks   Status Achieved  04/28/15    OT SHORT TERM GOAL #2   Title Pt will demo at least 105* R shoulder flex with 3/10 pain or less.   Baseline 90* with 6/10 pain   Time 4   Period Weeks   Status Achieved  05/05/15:  125* with 2/10 pain   OT SHORT TERM GOAL #3   Title Pt will report RUE pain less than or equal to 5/10 for ADLs.   Time 4   Period Weeks   Status Achieved  0-4/10   OT SHORT TERM GOAL #4   Title Pt will demo at least 85* R shoulder abduction for ADLs.   Baseline 70*   Time  4   Period Weeks   Status Achieved  05/05/15:  90* with 4/10 pain           OT Long Term Goals - 05/15/15 1544    OT LONG TERM GOAL #1   Title Pt will verbalize understanding of updated strategies to increase ease, safety, and independence and decr pain with ADLs/IADLs prn.--check LTGs 06/07/15   Time 8   Period Weeks   Status On-going   OT LONG TERM GOAL #2   Title Pt will improve LUE functional reaching/coordination as shown by improving time on box and blocks test by at least 4.   Baseline R-51 blocks, L-51 blocks   Status On-going   OT LONG TERM GOAL #3   Title Pt will improve balance for ADLs/IADLs as shown by improving standing functional reach test by 2 inches bilaterally.   Baseline 8 inches bilaterally   Time 8   Period Weeks   Status On-going   OT LONG TERM GOAL #4   Title Pt will improve RUE functional reaching/coordination as shown by improving time on box and blocks test by at least 5.   Baseline R-51 blocks, L-51 blocks   Time 8   Period Weeks   Status On-going   OT LONG TERM GOAL #5   Title Pt will demo at least 135* R shoulder flex with 3/10 pain or less.   Baseline 90* with 6/10 pain   Time 8   Period Weeks   Status Revised  05/05/15 upgraded goal as pt met original goal.   OT LONG TERM GOAL #6   Title Pt will demo at least 100* R shoulder abduction for ADLs.   Baseline 70* with pain   Time 8   Period Weeks   Status On-going               Plan - 05/15/15 1541    Clinical Impression Statement Pt continues to progress with improved R shoulder ROM and decr pain.  Pt reports no pain now at rest.  Pt would benefit from further occupational therapy to continue to address RUE pain/ROM/functional use, progress to strengthening as able, prevent future complication and improve quality of life.   Rehab Potential Good   Clinical Impairments Affecting Rehab Potential R shoulder pain   OT Frequency  2x / week   OT Duration 8 weeks   OT  Treatment/Interventions Moist Heat;Passive range of motion;Electrical Stimulation;Therapeutic activities;DME and/or AE instruction;Cryotherapy;Therapeutic exercises;Neuromuscular education;Ultrasound;Self-care/ADL training;Therapeutic exercise;Functional Mobility Training;Patient/family education;Manual Therapy   Plan trunk movements, posture, PWR!, ROM; ?add to schedule   OT Home Exercise Plan education provided:  initial HEP; 04/28/15 cane ex for shoulder abduction, ER, issued PWR! up, twist and rock in supine   Consulted and Agree with Plan of Care Patient      Patient will benefit from skilled therapeutic intervention in order to improve the following deficits and impairments:  Pain, Impaired UE functional use, Decreased knowledge of use of DME, Decreased activity tolerance, Impaired tone, Decreased mobility, Decreased strength, Decreased coordination, Decreased range of motion, Improper body mechanics  Visit Diagnosis: Stiffness of right shoulder, not elsewhere classified  Pain in right shoulder  Abnormal posture  Other symptoms and signs involving the nervous system  Other symptoms and signs involving the musculoskeletal system      G-Codes - 23-May-2015 1546    Functional Assessment Tool Used R shoulder flex 25*, abduction 90*, 0-3/10 pain limiting dominant RUE functional use   Functional Limitation Carrying, moving and handling objects   Carrying, Moving and Handling Objects Current Status (L5449) At least 20 percent but less than 40 percent impaired, limited or restricted   Carrying, Moving and Handling Objects Goal Status (E0100) At least 1 percent but less than 20 percent impaired, limited or restricted     Occupational Therapy Progress Note  Dates of Reporting Period: 04/08/15 to 23-May-2015  Objective Reports of Subjective Statement: see above  Objective Measurements: see above  Goal Update: see above  Plan: see above  Reason Skilled Services are Required: see  above   Problem List Patient Active Problem List   Diagnosis Date Noted  . Headache 05/11/2015  . Abnormal glucose 04/28/2015  . Globus sensation 04/24/2015  . Foot pain 02/25/2015  . Skin tear of forearm without complication 71/21/9758  . Skin lesion 12/10/2014  . Muscle pain 07/20/2014  . Shoulder pain 04/17/2014  . PD (Parkinson's disease) ([place]) 06/05/2013  . Tremor 05/18/2013  . Irritation of ear 05/18/2013  . Medicare annual wellness visit, subsequent 05/02/2012  . Neck pain 05/02/2012  . Osteoporosis 03/08/2011  . Neck mass 03/08/2011  . Breast cancer ([place]) 11/19/2010  . GERD (gastroesophageal reflux disease) 08/01/2008  . DYSPHAGIA UNSPECIFIED 08/01/2008  . ORTHOSTATIC HYPOTENSION 06/28/2008  . Goiter 07/10/2007  . HLD (hyperlipidemia) 07/10/2007  . MONOCLONAL GAMMOPATHY 07/10/2007  . ANEMIA-NOS 07/10/2007  . FATIGUE 07/10/2007    [facility] 2015-05-23, 4:05 PM  [facility] [address] [place], [name], [address] Phone: [phone]   Fax:  [phone]  Name: [name] MRN: [id number] Date of Birth: 1940-06-03  Vianne Bulls, OTR/L [facility] [address]. [place], [place]  [address] [phone] phone [phone] 05/23/15 4:05 PM

## 2015-05-19 ENCOUNTER — Ambulatory Visit: Payer: Medicare Other | Admitting: Occupational Therapy

## 2015-05-19 DIAGNOSIS — R293 Abnormal posture: Secondary | ICD-10-CM | POA: Diagnosis not present

## 2015-05-19 DIAGNOSIS — R29818 Other symptoms and signs involving the nervous system: Secondary | ICD-10-CM | POA: Diagnosis not present

## 2015-05-19 DIAGNOSIS — R29898 Other symptoms and signs involving the musculoskeletal system: Secondary | ICD-10-CM | POA: Diagnosis not present

## 2015-05-19 DIAGNOSIS — M25511 Pain in right shoulder: Secondary | ICD-10-CM | POA: Diagnosis not present

## 2015-05-19 DIAGNOSIS — M25611 Stiffness of right shoulder, not elsewhere classified: Secondary | ICD-10-CM

## 2015-05-19 NOTE — Therapy (Signed)
[facility] [address] [place], [name], [address] Phone: [phone]   Fax:  [phone]  Occupational Therapy Treatment  Patient Details  Name: [name] MRN: [id number] Date of Birth: 1940-04-15 Referring Provider: Dr. Wells Guiles Tat  Encounter Date: 05/19/2015      OT End of Session - 05/19/15 1323    Visit Number 11   Number of Visits 17   Date for OT Re-Evaluation 06/07/15   Authorization Type Medicare, BCBS (g-code needed)   Authorization Time Period re-cert 03/03/76-02/28/21*   Authorization - Visit Number 11   Authorization - Number of Visits 20   OT Start Time 1318   OT Stop Time 1400   OT Time Calculation (min) 42 min   Activity Tolerance Patient tolerated treatment well   Behavior During Therapy [facility] for tasks assessed/performed      Past Medical History  Diagnosis Date  . Esophageal Stricture   . Monoclonal gammopathy   . Anemia     Anemia-NOS / PMH, Dr Julien Nordmann  . Hyperlipidemia   . Vertical diplopia   . MVP (mitral valve prolapse)   . Breast cancer (HCC)     stage I right breast  . Skin cancer     basal cell L neck  . Allergy     SEASONAL  . Arthritis   . Cataract     BILATERAL  . Osteoporosis   . Decreased vision     R eye    Past Surgical History  Procedure Laterality Date  . Total abdominal hysterectomy w/ bilateral salpingoophorectomy  75 yrs old    for pain,prolapse ; G3 P2  . Salpingoophorectomy  2002    for benign growths  . Hemorrhoid surgery  1970's  . Appendectomy  2002  . Cardiac catheterization  2005    neg  . Colonoscopy      negative   . Bone biopsy  11/06/10  . Breast lumpectomy  02/2010    right breast lumpectomy and sentinel node biopsy  . Upper gastrointestinal endoscopy      There were no vitals filed for this visit.      Subjective Assessment - 05/19/15 1321    Subjective  Pt reports nothing new   Pertinent History hx of R breast CA with  lumpectomy and lymph node removal and radiation 2012; multiple myeloma, osteoporesis   Patient Stated Goals improve R shoulder pain   Currently in Pain? Yes   Pain Score 1    Pain Location Shoulder   Pain Orientation Right   Pain Descriptors / Indicators Aching   Pain Type Chronic pain   Pain Onset More than a month ago   Pain Frequency Intermittent   Aggravating Factors  high reaching in flex/abduction   Pain Relieving Factors rest             Neuro re-ed:  PWR! Moves (up, rock, twist) in supine and  modified quadraped x 10-20 each with min cues For incr movement amplitude (hands).  Supine and sitting, Shoulder flex with ball frame in supine and with min v.c. For posture/positioning, elbow ext   Supine and sitting, shoulder abduction with cane for AAROM.  Shoulder flex with 1lb ball in supine with min v.c. For posture/positioning, elbow ext   Functional reaching laterally, across body, and overhead with RUE incorporating wt. Shift and trunk rotation   Arm bike x31mn level 1 for reciprocal movement (forward/backwards).  OT Short Term Goals - 05/05/15 1521    OT SHORT TERM GOAL #1   Title Pt will be independent with initial HEP--check STGs 05/08/15   Time 4   Period Weeks   Status Achieved  04/28/15    OT SHORT TERM GOAL #2   Title Pt will demo at least 105* R shoulder flex with 3/10 pain or less.   Baseline 90* with 6/10 pain   Time 4   Period Weeks   Status Achieved  05/05/15:  125* with 2/10 pain   OT SHORT TERM GOAL #3   Title Pt will report RUE pain less than or equal to 5/10 for ADLs.   Time 4   Period Weeks   Status Achieved  0-4/10   OT SHORT TERM GOAL #4   Title Pt will demo at least 85* R shoulder abduction for ADLs.   Baseline 70*   Time 4   Period Weeks   Status Achieved  05/05/15:  90* with 4/10 pain           OT Long Term Goals - 05/15/15 1544    OT LONG TERM GOAL #1   Title Pt will verbalize  understanding of updated strategies to increase ease, safety, and independence and decr pain with ADLs/IADLs prn.--check LTGs 06/07/15   Time 8   Period Weeks   Status On-going   OT LONG TERM GOAL #2   Title Pt will improve LUE functional reaching/coordination as shown by improving time on box and blocks test by at least 4.   Baseline R-51 blocks, L-51 blocks   Status On-going   OT LONG TERM GOAL #3   Title Pt will improve balance for ADLs/IADLs as shown by improving standing functional reach test by 2 inches bilaterally.   Baseline 8 inches bilaterally   Time 8   Period Weeks   Status On-going   OT LONG TERM GOAL #4   Title Pt will improve RUE functional reaching/coordination as shown by improving time on box and blocks test by at least 5.   Baseline R-51 blocks, L-51 blocks   Time 8   Period Weeks   Status On-going   OT LONG TERM GOAL #5   Title Pt will demo at least 135* R shoulder flex with 3/10 pain or less.   Baseline 90* with 6/10 pain   Time 8   Period Weeks   Status Revised  05/05/15 upgraded goal as pt met original goal.   OT LONG TERM GOAL #6   Title Pt will demo at least 100* R shoulder abduction for ADLs.   Baseline 70* with pain   Time 8   Period Weeks   Status On-going             Patient will benefit from skilled therapeutic intervention in order to improve the following deficits and impairments:     Visit Diagnosis: Pain in right shoulder  Stiffness of right shoulder, not elsewhere classified  Abnormal posture  Other symptoms and signs involving the nervous system  Other symptoms and signs involving the musculoskeletal system    Problem List Patient Active Problem List   Diagnosis Date Noted  . Headache 05/11/2015  . Abnormal glucose 04/28/2015  . Globus sensation 04/24/2015  . Foot pain 02/25/2015  . Skin tear of forearm without complication 60/73/7106  . Skin lesion 12/10/2014  . Muscle pain 07/20/2014  . Shoulder pain 04/17/2014   . PD (Parkinson's disease) ([place]) 06/05/2013  . Tremor 05/18/2013  .  Irritation of ear 05/18/2013  . Medicare annual wellness visit, subsequent 05/02/2012  . Neck pain 05/02/2012  . Osteoporosis 03/08/2011  . Neck mass 03/08/2011  . Breast cancer ([place]) 11/19/2010  . GERD (gastroesophageal reflux disease) 08/01/2008  . DYSPHAGIA UNSPECIFIED 08/01/2008  . ORTHOSTATIC HYPOTENSION 06/28/2008  . Goiter 07/10/2007  . HLD (hyperlipidemia) 07/10/2007  . MONOCLONAL GAMMOPATHY 07/10/2007  . ANEMIA-NOS 07/10/2007  . FATIGUE 07/10/2007    [facility] 05/19/2015, 1:25 PM  [facility] [address] [place], [name], [address] Phone: [phone]   Fax:  [phone]  Name: [name] MRN: [id number] Date of Birth: 04-07-1940  Vianne Bulls, OTR/L [facility] [address] [place], [place]  [address] [phone] phone [phone] 05/19/2015 1:25 PM

## 2015-05-22 ENCOUNTER — Other Ambulatory Visit: Payer: Self-pay | Admitting: Internal Medicine

## 2015-05-22 ENCOUNTER — Ambulatory Visit: Payer: Medicare Other | Admitting: Occupational Therapy

## 2015-05-22 DIAGNOSIS — M25611 Stiffness of right shoulder, not elsewhere classified: Secondary | ICD-10-CM | POA: Diagnosis not present

## 2015-05-22 DIAGNOSIS — R29898 Other symptoms and signs involving the musculoskeletal system: Secondary | ICD-10-CM

## 2015-05-22 DIAGNOSIS — R293 Abnormal posture: Secondary | ICD-10-CM | POA: Diagnosis not present

## 2015-05-22 DIAGNOSIS — R29818 Other symptoms and signs involving the nervous system: Secondary | ICD-10-CM

## 2015-05-22 DIAGNOSIS — M25511 Pain in right shoulder: Secondary | ICD-10-CM

## 2015-05-22 NOTE — Therapy (Signed)
[facility] [address] [place], [name], [address] Phone: [phone]   Fax:  [phone]  Occupational Therapy Treatment  Patient Details  Name: [name] MRN: [id number] Date of Birth: 09/26/1940 Referring Provider: Dr. Wells Guiles Tat  Encounter Date: 05/22/2015      OT End of Session - 05/22/15 1508    Visit Number 12   Number of Visits 17   Date for OT Re-Evaluation 06/07/15   Authorization Type Medicare, BCBS (g-code needed)   Authorization Time Period re-cert 0/0/76-02/26/61*   Authorization - Visit Number 12   Authorization - Number of Visits 20   OT Start Time 1455   OT Stop Time 1538   OT Time Calculation (min) 43 min   Activity Tolerance Patient tolerated treatment well   Behavior During Therapy [facility] for tasks assessed/performed      Past Medical History  Diagnosis Date  . Esophageal Stricture   . Monoclonal gammopathy   . Anemia     Anemia-NOS / PMH, Dr Julien Nordmann  . Hyperlipidemia   . Vertical diplopia   . MVP (mitral valve prolapse)   . Breast cancer (HCC)     stage I right breast  . Skin cancer     basal cell L neck  . Allergy     SEASONAL  . Arthritis   . Cataract     BILATERAL  . Osteoporosis   . Decreased vision     R eye    Past Surgical History  Procedure Laterality Date  . Total abdominal hysterectomy w/ bilateral salpingoophorectomy  75 yrs old    for pain,prolapse ; G3 P2  . Salpingoophorectomy  2002    for benign growths  . Hemorrhoid surgery  1970's  . Appendectomy  2002  . Cardiac catheterization  2005    neg  . Colonoscopy      negative   . Bone biopsy  11/06/10  . Breast lumpectomy  02/2010    right breast lumpectomy and sentinel node biopsy  . Upper gastrointestinal endoscopy      There were no vitals filed for this visit.      Subjective Assessment - 05/22/15 1502    Subjective  "It's better"   Pertinent History hx of R breast CA with lumpectomy and  lymph node removal and radiation 2012; multiple myeloma, osteoporesis   Patient Stated Goals improve R shoulder pain   Currently in Pain? Yes   Pain Score 3    Pain Location --  shoulder   Pain Orientation Right   Pain Descriptors / Indicators Aching   Pain Type Chronic pain   Pain Onset More than a month ago   Pain Frequency Intermittent   Aggravating Factors  high reaching in flex/ abduction   Pain Relieving Factors rest        Neuro re-ed:  PWR! Moves (up, rock, twist) in supine and sitting x 10-20 each with min cues For incr movement amplitude (hands), Then PWR! Up and rock in modified quadraped x 10 each with min cues for incr movement amplitude.  Supine and sitting, Shoulder flex with ball frame in supine and with min v.c. For posture/positioning, elbow ext   Supine and sitting, shoulder abduction with cane for AAROM.  Functional reaching overhead with RUE to copy small peg design with 1/10 pain.  In standing, wt. Shifts side to side with reaching to targets with each UE with set-up for large amplitude movements.  In standing, shoulder flex with  ball on wall.  Arm bike x29mn level 1 for reciprocal movement (forward/backwards).                         OT Education - 05/22/15 1537    Education Details Recommendation from previous PT that pt have eval with vestibular PT due to pt reports of difficulty with balance with eyes closed, turning, and getting up--pt agrees   [employer] Educated Patient   Methods Explanation   Comprehension Verbalized understanding          OT Short Term Goals - 05/05/15 1521    OT SHORT TERM GOAL #1   Title Pt will be independent with initial HEP--check STGs 05/08/15   Time 4   Period Weeks   Status Achieved  04/28/15    OT SHORT TERM GOAL #2   Title Pt will demo at least 105* R shoulder flex with 3/10 pain or less.   Baseline 90* with 6/10 pain   Time 4   Period Weeks   Status Achieved  05/05/15:  125* with 2/10  pain   OT SHORT TERM GOAL #3   Title Pt will report RUE pain less than or equal to 5/10 for ADLs.   Time 4   Period Weeks   Status Achieved  0-4/10   OT SHORT TERM GOAL #4   Title Pt will demo at least 85* R shoulder abduction for ADLs.   Baseline 70*   Time 4   Period Weeks   Status Achieved  05/05/15:  90* with 4/10 pain           OT Long Term Goals - 05/15/15 1544    OT LONG TERM GOAL #1   Title Pt will verbalize understanding of updated strategies to increase ease, safety, and independence and decr pain with ADLs/IADLs prn.--check LTGs 06/07/15   Time 8   Period Weeks   Status On-going   OT LONG TERM GOAL #2   Title Pt will improve LUE functional reaching/coordination as shown by improving time on box and blocks test by at least 4.   Baseline R-51 blocks, L-51 blocks   Status On-going   OT LONG TERM GOAL #3   Title Pt will improve balance for ADLs/IADLs as shown by improving standing functional reach test by 2 inches bilaterally.   Baseline 8 inches bilaterally   Time 8   Period Weeks   Status On-going   OT LONG TERM GOAL #4   Title Pt will improve RUE functional reaching/coordination as shown by improving time on box and blocks test by at least 5.   Baseline R-51 blocks, L-51 blocks   Time 8   Period Weeks   Status On-going   OT LONG TERM GOAL #5   Title Pt will demo at least 135* R shoulder flex with 3/10 pain or less.   Baseline 90* with 6/10 pain   Time 8   Period Weeks   Status Revised  05/05/15 upgraded goal as pt met original goal.   OT LONG TERM GOAL #6   Title Pt will demo at least 100* R shoulder abduction for ADLs.   Baseline 70* with pain   Time 8   Period Weeks   Status On-going               Plan - 05/22/15 1525    Clinical Impression Statement Pt continues to progress with improved R shoulder ROM and decr pain.     Plan  trunk movements, posture, PWR!, ROM; Discussed feeling "drunk" with pt's previous PT and it was recommended that pt  be evaluated with vestibular PT--will send request to Dr. Carles Collet   OT Home Exercise Plan education provided:  initial HEP; 04/28/15 cane ex for shoulder abduction, ER, issued PWR! up, twist and rock in supine   Consulted and Agree with Plan of Care Patient      Patient will benefit from skilled therapeutic intervention in order to improve the following deficits and impairments:     Visit Diagnosis: Pain in right shoulder  Stiffness of right shoulder, not elsewhere classified  Abnormal posture  Other symptoms and signs involving the nervous system  Other symptoms and signs involving the musculoskeletal system    Problem List Patient Active Problem List   Diagnosis Date Noted  . Headache 05/11/2015  . Abnormal glucose 04/28/2015  . Globus sensation 04/24/2015  . Foot pain 02/25/2015  . Skin tear of forearm without complication 81/44/8185  . Skin lesion 12/10/2014  . Muscle pain 07/20/2014  . Shoulder pain 04/17/2014  . PD (Parkinson's disease) ([place]) 06/05/2013  . Tremor 05/18/2013  . Irritation of ear 05/18/2013  . Medicare annual wellness visit, subsequent 05/02/2012  . Neck pain 05/02/2012  . Osteoporosis 03/08/2011  . Neck mass 03/08/2011  . Breast cancer ([place]) 11/19/2010  . GERD (gastroesophageal reflux disease) 08/01/2008  . DYSPHAGIA UNSPECIFIED 08/01/2008  . ORTHOSTATIC HYPOTENSION 06/28/2008  . Goiter 07/10/2007  . HLD (hyperlipidemia) 07/10/2007  . MONOCLONAL GAMMOPATHY 07/10/2007  . ANEMIA-NOS 07/10/2007  . FATIGUE 07/10/2007    [facility] 05/22/2015, 4:23 PM  [place] [address] [place], [name], [address] Phone: [phone]   Fax:  [phone]  Name: [name] MRN: [id number] Date of Birth: 1940-06-03  Vianne Bulls, OTR/L [facility] [address] [place], [place]  [address] [phone] phone [phone] 05/22/2015 4:23 PM

## 2015-05-23 ENCOUNTER — Telehealth: Payer: Self-pay | Admitting: Neurology

## 2015-05-23 DIAGNOSIS — G20 Parkinson's disease: Secondary | ICD-10-CM

## 2015-05-23 NOTE — Telephone Encounter (Signed)
-----   Message from [place], DO sent at 05/23/2015  7:53 AM EDT ----- Sure.  May be from the PD from dysautonomia as well but certainly can try!  Thx ----- Message -----    From: [place], OT    Sent: 05/22/2015   4:58 PM      To: Charlyne Petrin, DO, #  Dr. Carles Collet,   Mrs. Mabie has been complaining of "feeling drunk" when she gets up, with eyes closed in the shower, and with turning.  I have spoken with Amy and one of our vestibular therapist and we feel like she may have decr vestibular input and may benefit from a physical therapy eval with one of our vestibular therapists Vinnie Level or [place]).  If you agree, please send an order for PT via epic.  Thanks,   Vianne Bulls, OTR/L [facility] [address] [place], [place]  [address] [phone] phone [phone] 05/22/2015 5:03 PM

## 2015-05-23 NOTE — Telephone Encounter (Signed)
Referral sent 

## 2015-05-26 ENCOUNTER — Ambulatory Visit: Payer: Medicare Other | Attending: Neurology | Admitting: Occupational Therapy

## 2015-05-26 DIAGNOSIS — R42 Dizziness and giddiness: Secondary | ICD-10-CM | POA: Diagnosis not present

## 2015-05-26 DIAGNOSIS — M25511 Pain in right shoulder: Secondary | ICD-10-CM | POA: Diagnosis not present

## 2015-05-26 DIAGNOSIS — R2689 Other abnormalities of gait and mobility: Secondary | ICD-10-CM | POA: Insufficient documentation

## 2015-05-26 DIAGNOSIS — R293 Abnormal posture: Secondary | ICD-10-CM | POA: Insufficient documentation

## 2015-05-26 DIAGNOSIS — R29898 Other symptoms and signs involving the musculoskeletal system: Secondary | ICD-10-CM | POA: Diagnosis not present

## 2015-05-26 DIAGNOSIS — R29818 Other symptoms and signs involving the nervous system: Secondary | ICD-10-CM

## 2015-05-26 DIAGNOSIS — R2681 Unsteadiness on feet: Secondary | ICD-10-CM | POA: Insufficient documentation

## 2015-05-26 DIAGNOSIS — M25611 Stiffness of right shoulder, not elsewhere classified: Secondary | ICD-10-CM

## 2015-05-26 NOTE — Therapy (Addendum)
Occupational Therapy Treatment  Patient Details  Name: [name] MRN: [id number] Date of Birth: [birth year] Referring Provider: Dr. Wells Guiles Tat  Encounter Date: 05/26/2015      OT End of Session - 05/26/15 1324    Visit Number 13   Number of Visits 17   Date for OT Re-Evaluation 06/07/15   Authorization Type Medicare, BCBS (g-code needed)   Authorization Time Period re-cert 07/01/65-2/0/94*   Authorization - Visit Number 13   Authorization - Number of Visits 20   OT Start Time 1319   OT Stop Time 1400   OT Time Calculation (min) 41 min   Activity Tolerance Patient tolerated treatment well   Behavior During Therapy [facility] for tasks assessed/performed      Past Medical History  Diagnosis Date  . Esophageal Stricture   . Monoclonal gammopathy   . Anemia     Anemia-NOS / PMH, Dr Julien Nordmann  . Hyperlipidemia   . Vertical diplopia   . MVP (mitral valve prolapse)   . Breast cancer (HCC)     stage I right breast  . Skin cancer     basal cell L neck  . Allergy     SEASONAL  . Arthritis   . Cataract     BILATERAL  . Osteoporosis   . Decreased vision     R eye    Past Surgical History  Procedure Laterality Date  . Total abdominal hysterectomy w/ bilateral salpingoophorectomy  75 yrs old    for pain,prolapse ; G3 P2  . Salpingoophorectomy  2002    for benign growths  . Hemorrhoid surgery  1970's  . Appendectomy  2002  . Cardiac catheterization  2005    neg  . Colonoscopy      negative   . Bone biopsy  11/06/10  . Breast lumpectomy  02/2010    right breast lumpectomy and sentinel node biopsy  . Upper gastrointestinal endoscopy      There were no vitals filed for this visit.      Subjective Assessment - 05/26/15 1320    Subjective  Pt reports that shoulder was 5-6/10 pain in the middle of the night, questions if she rolled over on it   Pertinent History hx of R breast CA with lumpectomy and lymph node removal and radiation 2012; multiple myeloma, osteoporesis    Patient Stated Goals improve R shoulder pain   Currently in Pain? Yes   Pain Score 2    Pain Location Shoulder   Pain Orientation Right   Pain Descriptors / Indicators Aching   Pain Type Chronic pain   Pain Onset More than a month ago   Pain Frequency Intermittent   Aggravating Factors  high reaching, rolling on it at night   Pain Relieving Factors rest        Neuro re-ed:  PWR! Moves (up, rock, twist) in supine and modified quadraped x 10-20 each with min cues For incr movement amplitude (hands), Then PWR! Up and twist in prone x 10 each with min cues for incr movement amplitude.    Supine and sitting, Shoulder flex with ball frame in supine and with min v.c. For posture/positioning, elbow ext   Supine and sitting, shoulder abduction with cane for AAROM.  In supine, shoulder flex and chest press with 2lb weighted ball  In sitting, shoulder flex, chest press, diagonals to each side with 1lb weighted ball x10 each.  Functional reaching overhead with RUE incorporating trunk rotation (reaching in ER and overhead).  In standing, shoulder flex with ball on wall AAROM and then with UE ranger for high range shoulder flex.                           OT Short Term Goals - 05/05/15 1521    OT SHORT TERM GOAL #1   Title Pt will be independent with initial HEP--check STGs 05/08/15   Time 4   Period Weeks   Status Achieved  04/28/15    OT SHORT TERM GOAL #2   Title Pt will demo at least 105* R shoulder flex with 3/10 pain or less.   Baseline 90* with 6/10 pain   Time 4   Period Weeks   Status Achieved  05/05/15:  125* with 2/10 pain   OT SHORT TERM GOAL #3   Title Pt will report RUE pain less than or equal to 5/10 for ADLs.   Time 4   Period Weeks   Status Achieved  0-4/10   OT SHORT TERM GOAL #4   Title Pt will demo at least 85* R shoulder abduction for ADLs.   Baseline 70*   Time 4   Period Weeks   Status Achieved  05/05/15:  90* with 4/10 pain            OT Long Term Goals - 05/15/15 1544    OT LONG TERM GOAL #1   Title Pt will verbalize understanding of updated strategies to increase ease, safety, and independence and decr pain with ADLs/IADLs prn.--check LTGs 06/07/15   Time 8   Period Weeks   Status On-going   OT LONG TERM GOAL #2   Title Pt will improve LUE functional reaching/coordination as shown by improving time on box and blocks test by at least 4.   Baseline R-51 blocks, L-51 blocks   Status On-going   OT LONG TERM GOAL #3   Title Pt will improve balance for ADLs/IADLs as shown by improving standing functional reach test by 2 inches bilaterally.   Baseline 8 inches bilaterally   Time 8   Period Weeks   Status On-going   OT LONG TERM GOAL #4   Title Pt will improve RUE functional reaching/coordination as shown by improving time on box and blocks test by at least 5.   Baseline R-51 blocks, L-51 blocks   Time 8   Period Weeks   Status On-going   OT LONG TERM GOAL #5   Title Pt will demo at least 135* R shoulder flex with 3/10 pain or less.   Baseline 90* with 6/10 pain   Time 8   Period Weeks   Status Revised  05/05/15 upgraded goal as pt met original goal.   OT LONG TERM GOAL #6   Title Pt will demo at least 100* R shoulder abduction for ADLs.   Baseline 70* with pain   Time 8   Period Weeks   Status On-going               Plan - 05/26/15 1325    Clinical Impression Statement Pt continues to make steady progress with shoulder pain and ROM.   Plan trunk movements, posture, PWR!, ROM   OT Home Exercise Plan education provided:  initial HEP; 04/28/15 cane ex for shoulder abduction, ER, issued PWR! up, twist and rock in supine   Recommended Other Services requested referral for vestibular PT (and Dr. Carles Collet entered in epic)   Consulted and Agree with Plan of  Care Patient      Patient will benefit from skilled therapeutic intervention in order to improve the following deficits and impairments:      Visit Diagnosis: Pain in right shoulder  Stiffness of right shoulder, not elsewhere classified  Abnormal posture  Other symptoms and signs involving the nervous system  Other symptoms and signs involving the musculoskeletal system    Problem List Patient Active Problem List   Diagnosis Date Noted  . Headache 05/11/2015  . Abnormal glucose 04/28/2015  . Globus sensation 04/24/2015  . Foot pain 02/25/2015  . Skin tear of forearm without complication 99/37/1696  . Skin lesion 12/10/2014  . Muscle pain 07/20/2014  . Shoulder pain 04/17/2014  . PD (Parkinson's disease) ([place]) 06/05/2013  . Tremor 05/18/2013  . Irritation of ear 05/18/2013  . Medicare annual wellness visit, subsequent 05/02/2012  . Neck pain 05/02/2012  . Osteoporosis 03/08/2011  . Neck mass 03/08/2011  . Breast cancer ([place]) 11/19/2010  . GERD (gastroesophageal reflux disease) 08/01/2008  . DYSPHAGIA UNSPECIFIED 08/01/2008  . ORTHOSTATIC HYPOTENSION 06/28/2008  . Goiter 07/10/2007  . HLD (hyperlipidemia) 07/10/2007  . MONOCLONAL GAMMOPATHY 07/10/2007  . ANEMIA-NOS 07/10/2007  . FATIGUE 07/10/2007    [facility] 05/26/2015, 1:28 PM  [place] [address] [place], [name], [address] Phone: [phone]   Fax:  [phone]  Name: ERICIA MOXLEY MRN: [id number] Date of Birth: 06/08/1940  Vianne Bulls, OTR/L [facility] [address] [place], [place]  [address] [phone] phone [phone] 05/27/2015 8:21 AM

## 2015-05-29 ENCOUNTER — Ambulatory Visit: Payer: Medicare Other | Admitting: Occupational Therapy

## 2015-05-29 DIAGNOSIS — M25511 Pain in right shoulder: Secondary | ICD-10-CM

## 2015-05-29 DIAGNOSIS — R293 Abnormal posture: Secondary | ICD-10-CM | POA: Diagnosis not present

## 2015-05-29 DIAGNOSIS — R2681 Unsteadiness on feet: Secondary | ICD-10-CM | POA: Diagnosis not present

## 2015-05-29 DIAGNOSIS — R29898 Other symptoms and signs involving the musculoskeletal system: Secondary | ICD-10-CM | POA: Diagnosis not present

## 2015-05-29 DIAGNOSIS — M25611 Stiffness of right shoulder, not elsewhere classified: Secondary | ICD-10-CM

## 2015-05-29 DIAGNOSIS — R29818 Other symptoms and signs involving the nervous system: Secondary | ICD-10-CM

## 2015-05-29 NOTE — Therapy (Signed)
[place] [address] [place], [name], [address] Phone: [phone]   Fax:  [phone]  Occupational Therapy Treatment  Patient Details  Name: [name] MRN: [id number] Date of Birth: [birth year] Referring Provider: Dr. Wells Guiles Tat  Encounter Date: 05/29/2015      OT End of Session - 05/29/15 1459    Visit Number 14   Number of Visits 17   Date for OT Re-Evaluation 06/07/15   Authorization Type Medicare, BCBS (g-code needed)   Authorization Time Period re-cert 07/27/72-08/31/72*   Authorization - Visit Number 14   Authorization - Number of Visits 20   OT Start Time 1452   OT Stop Time 1533   OT Time Calculation (min) 41 min   Activity Tolerance Patient tolerated treatment well   Behavior During Therapy [facility] for tasks assessed/performed      Past Medical History  Diagnosis Date  . Esophageal Stricture   . Monoclonal gammopathy   . Anemia     Anemia-NOS / PMH, Dr Julien Nordmann  . Hyperlipidemia   . Vertical diplopia   . MVP (mitral valve prolapse)   . Breast cancer (HCC)     stage I right breast  . Skin cancer     basal cell L neck  . Allergy     SEASONAL  . Arthritis   . Cataract     BILATERAL  . Osteoporosis   . Decreased vision     R eye    Past Surgical History  Procedure Laterality Date  . Total abdominal hysterectomy w/ bilateral salpingoophorectomy  75 yrs old    for pain,prolapse ; G3 P2  . Salpingoophorectomy  2002    for benign growths  . Hemorrhoid surgery  1970's  . Appendectomy  2002  . Cardiac catheterization  2005    neg  . Colonoscopy      negative   . Bone biopsy  11/06/10  . Breast lumpectomy  02/2010    right breast lumpectomy and sentinel node biopsy  . Upper gastrointestinal endoscopy      There were no vitals filed for this visit.      Subjective Assessment - 05/29/15 1456    Subjective  Pt reports that pain has been better   Pertinent History hx of R breast CA  with lumpectomy and lymph node removal and radiation 2012; multiple myeloma, osteoporesis   Patient Stated Goals improve R shoulder pain   Currently in Pain? Yes   Pain Score 2    Pain Location Shoulder   Pain Orientation Right   Pain Descriptors / Indicators Aching   Pain Type Chronic pain   Aggravating Factors  high reaching, rolling on it at night   Pain Relieving Factors rest          Neuro re-ed:  PWR! Moves (up, rock, twist) in supine x 10-20 each with min cues intermittentlyFor incr movement amplitude (hands), Then PWR! Up and twist in prone x 10 each with min cues for incr movement amplitude.    Supine and sitting, Shoulder flex with ball in supine and with min v.c. For posture/positioning, elbow ext   Supine and sitting, shoulder abduction with cane for AAROM.  In supine, shoulder flex and chest press with 2lb weighted ball  In sitting, shoulder flex, chest press with 2lb weighted ball x10 each (shoulder flex to 90*).  Functional reaching overhead with RUE incorporating trunk rotation (reaching in ER and overhead) to place small pegs in vertical pegboard.  In standing, shoulder flex with ball on wall AAROM and then with UE ranger for high range shoulder flex.  Arm bike x76mn level 3 for reciprocal movement and gentle conditioning.  Shoulder flex with UE ranger in standing for high range with 1lb cuff wt. On R wrist.                              OT Short Term Goals - 05/05/15 1521    OT SHORT TERM GOAL #1   Title Pt will be independent with initial HEP--check STGs 05/08/15   Time 4   Period Weeks   Status Achieved  04/28/15    OT SHORT TERM GOAL #2   Title Pt will demo at least 105* R shoulder flex with 3/10 pain or less.   Baseline 90* with 6/10 pain   Time 4   Period Weeks   Status Achieved  05/05/15:  125* with 2/10 pain   OT SHORT TERM GOAL #3   Title Pt will report RUE pain less than or equal to 5/10 for ADLs.   Time 4   Period  Weeks   Status Achieved  0-4/10   OT SHORT TERM GOAL #4   Title Pt will demo at least 85* R shoulder abduction for ADLs.   Baseline 70*   Time 4   Period Weeks   Status Achieved  05/05/15:  90* with 4/10 pain           OT Long Term Goals - 05/15/15 1544    OT LONG TERM GOAL #1   Title Pt will verbalize understanding of updated strategies to increase ease, safety, and independence and decr pain with ADLs/IADLs prn.--check LTGs 06/07/15   Time 8   Period Weeks   Status On-going   OT LONG TERM GOAL #2   Title Pt will improve LUE functional reaching/coordination as shown by improving time on box and blocks test by at least 4.   Baseline R-51 blocks, L-51 blocks   Status On-going   OT LONG TERM GOAL #3   Title Pt will improve balance for ADLs/IADLs as shown by improving standing functional reach test by 2 inches bilaterally.   Baseline 8 inches bilaterally   Time 8   Period Weeks   Status On-going   OT LONG TERM GOAL #4   Title Pt will improve RUE functional reaching/coordination as shown by improving time on box and blocks test by at least 5.   Baseline R-51 blocks, L-51 blocks   Time 8   Period Weeks   Status On-going   OT LONG TERM GOAL #5   Title Pt will demo at least 135* R shoulder flex with 3/10 pain or less.   Baseline 90* with 6/10 pain   Time 8   Period Weeks   Status Revised  05/05/15 upgraded goal as pt met original goal.   OT LONG TERM GOAL #6   Title Pt will demo at least 100* R shoulder abduction for ADLs.   Baseline 70* with pain   Time 8   Period Weeks   Status On-going               Plan - 05/29/15 1548    Clinical Impression Statement Pt continues to progress with incr ROM and is tolerating light resistive activities.   Plan check goals next week.   OT Home Exercise Plan education provided:  initial HEP; 04/28/15 cane ex for shoulder  abduction, ER, issued PWR! up, twist and rock in supine   Consulted and Agree with Plan of Care Patient       Patient will benefit from skilled therapeutic intervention in order to improve the following deficits and impairments:     Visit Diagnosis: Pain in right shoulder  Stiffness of right shoulder, not elsewhere classified  Abnormal posture  Other symptoms and signs involving the nervous system  Other symptoms and signs involving the musculoskeletal system    Problem List Patient Active Problem List   Diagnosis Date Noted  . Headache 05/11/2015  . Abnormal glucose 04/28/2015  . Globus sensation 04/24/2015  . Foot pain 02/25/2015  . Skin tear of forearm without complication 54/56/2563  . Skin lesion 12/10/2014  . Muscle pain 07/20/2014  . Shoulder pain 04/17/2014  . PD (Parkinson's disease) ([place]) 06/05/2013  . Tremor 05/18/2013  . Irritation of ear 05/18/2013  . Medicare annual wellness visit, subsequent 05/02/2012  . Neck pain 05/02/2012  . Osteoporosis 03/08/2011  . Neck mass 03/08/2011  . Breast cancer ([place]) 11/19/2010  . GERD (gastroesophageal reflux disease) 08/01/2008  . DYSPHAGIA UNSPECIFIED 08/01/2008  . ORTHOSTATIC HYPOTENSION 06/28/2008  . Goiter 07/10/2007  . HLD (hyperlipidemia) 07/10/2007  . MONOCLONAL GAMMOPATHY 07/10/2007  . ANEMIA-NOS 07/10/2007  . FATIGUE 07/10/2007    [facility] 05/29/2015, 3:49 PM  [place] [address] [place], [name], [address] Phone: [phone]   Fax:  [phone]  Name: [name] MRN: [id number] Date of Birth: 12/05/1940  Vianne Bulls, OTR/L [facility] [address] [place], [place]  [address] [phone] phone [phone] 05/29/2015 3:49 PM

## 2015-06-02 ENCOUNTER — Ambulatory Visit: Payer: Medicare Other | Admitting: Occupational Therapy

## 2015-06-02 DIAGNOSIS — R293 Abnormal posture: Secondary | ICD-10-CM | POA: Diagnosis not present

## 2015-06-02 DIAGNOSIS — R29818 Other symptoms and signs involving the nervous system: Secondary | ICD-10-CM | POA: Diagnosis not present

## 2015-06-02 DIAGNOSIS — M25511 Pain in right shoulder: Secondary | ICD-10-CM | POA: Diagnosis not present

## 2015-06-02 DIAGNOSIS — R29898 Other symptoms and signs involving the musculoskeletal system: Secondary | ICD-10-CM

## 2015-06-02 DIAGNOSIS — R2681 Unsteadiness on feet: Secondary | ICD-10-CM | POA: Diagnosis not present

## 2015-06-02 DIAGNOSIS — M25611 Stiffness of right shoulder, not elsewhere classified: Secondary | ICD-10-CM

## 2015-06-02 NOTE — Therapy (Signed)
[place] [address] [place], [name], [address] Phone: [phone]   Fax:  [phone]  Occupational Therapy Treatment  Patient Details  Name: [name] MRN: [id number] Date of Birth: 1940-01-27 Referring Provider: Dr. Wells Guiles Tat  Encounter Date: 06/02/2015      OT End of Session - 06/02/15 1111    Visit Number 15   Number of Visits 17   Date for OT Re-Evaluation 06/07/15   Authorization Type Medicare, BCBS (g-code needed)   Authorization Time Period re-cert 07/27/69-0/6/26*   Authorization - Visit Number 15   Authorization - Number of Visits 20   OT Start Time 1106   OT Stop Time 1145   OT Time Calculation (min) 39 min   Activity Tolerance Patient tolerated treatment well   Behavior During Therapy [facility] for tasks assessed/performed      Past Medical History  Diagnosis Date  . Esophageal Stricture   . Monoclonal gammopathy   . Anemia     Anemia-NOS / PMH, Dr Julien Nordmann  . Hyperlipidemia   . Vertical diplopia   . MVP (mitral valve prolapse)   . Breast cancer (HCC)     stage I right breast  . Skin cancer     basal cell L neck  . Allergy     SEASONAL  . Arthritis   . Cataract     BILATERAL  . Osteoporosis   . Decreased vision     R eye    Past Surgical History  Procedure Laterality Date  . Total abdominal hysterectomy w/ bilateral salpingoophorectomy  75 yrs old    for pain,prolapse ; G3 P2  . Salpingoophorectomy  2002    for benign growths  . Hemorrhoid surgery  1970's  . Appendectomy  2002  . Cardiac catheterization  2005    neg  . Colonoscopy      negative   . Bone biopsy  11/06/10  . Breast lumpectomy  02/2010    right breast lumpectomy and sentinel node biopsy  . Upper gastrointestinal endoscopy      There were no vitals filed for this visit.      Subjective Assessment - 06/02/15 1108    Subjective  Pt reports that push mowing only bothers her shoulder a little bit.  "My  shoulder really hasn't bothered me the last few days, but I haven't really done anything though"   Pertinent History hx of R breast CA with lumpectomy and lymph node removal and radiation 2012; multiple myeloma, osteoporesis   Patient Stated Goals improve R shoulder pain   Currently in Pain? Yes   Pain Score 1   or a half   Pain Location Shoulder   Pain Descriptors / Indicators Aching   Pain Type Chronic pain   Aggravating Factors  high reaching, rolling on it at night   Pain Relieving Factors rest        Neuro re-ed:  PWR! Moves (up, rock, twist) in supine x 10-20 each with min cues intermittently For incr movement amplitude (hands), Then PWR! Up and twist in prone x 10 each with min cues for incr movement amplitude.  PWR! (rock) in modified quadraped with min cues, unable to do PWR! Twist today in this position due to pain.  Supine, Shoulder flex with ball in supine and with min v.c. For posture/positioning, elbow ext   Supine, shoulder abduction with cane for AAROM.  In supine, shoulder flex and chest press with 2lb weighted ball.  Then  diagonals to each side with 1lb wt. Ball.    In sitting, shoulder flex, chest press with 2lb weighted ball x10 each (shoulder flex to 90*).  In standing, shoulder flex with ball on wall AAROM for high range shoulder flex.  Functional reaching overhead with RUE incorporating trunk rotation (reaching in ER, across body, and overhead) to place small pegs in vertical pegboard.  Self Care:  Began checking goals and discussing progress.  Pt reports pain with donning jacket.  Recommended that pt don RUE first.  Pt verbalized understanding and return demo with no reports of pain.                          OT Short Term Goals - 05/05/15 1521    OT SHORT TERM GOAL #1   Title Pt will be independent with initial HEP--check STGs 05/08/15   Time 4   Period Weeks   Status Achieved  04/28/15    OT SHORT TERM GOAL #2   Title Pt will demo  at least 105* R shoulder flex with 3/10 pain or less.   Baseline 90* with 6/10 pain   Time 4   Period Weeks   Status Achieved  05/05/15:  125* with 2/10 pain   OT SHORT TERM GOAL #3   Title Pt will report RUE pain less than or equal to 5/10 for ADLs.   Time 4   Period Weeks   Status Achieved  0-4/10   OT SHORT TERM GOAL #4   Title Pt will demo at least 85* R shoulder abduction for ADLs.   Baseline 70*   Time 4   Period Weeks   Status Achieved  05/05/15:  90* with 4/10 pain           OT Long Term Goals - 06/02/15 1140    OT LONG TERM GOAL #1   Title Pt will verbalize understanding of updated strategies to increase ease, safety, and independence and decr pain with ADLs/IADLs prn.--check LTGs 06/07/15   Time 8   Period Weeks   Status On-going   OT LONG TERM GOAL #2   Title Pt will improve LUE functional reaching/coordination as shown by improving time on box and blocks test by at least 4.   Baseline R-51 blocks, L-51 blocks   Status On-going   OT LONG TERM GOAL #3   Title Pt will improve balance for ADLs/IADLs as shown by improving standing functional reach test by 2 inches bilaterally.   Baseline 8 inches bilaterally   Time 8   Period Weeks   Status On-going   OT LONG TERM GOAL #4   Title Pt will improve RUE functional reaching/coordination as shown by improving time on box and blocks test by at least 5.   Baseline R-51 blocks, L-51 blocks   Time 8   Period Weeks   Status On-going   OT LONG TERM GOAL #5   Title Pt will demo at least 135* R shoulder flex with 3/10 pain or less.   Baseline 90* with 6/10 pain   Time 8   Period Weeks   Status Partially Met  05/05/15 upgraded goal as pt met original goal;  06/02/15  132* with 4/10 pain   OT LONG TERM GOAL #6   Title Pt will demo at least 100* R shoulder abduction for ADLs.   Baseline 70* with pain   Time 8   Period Weeks   Status Achieved  06/02/15:  120*  with 4/10 pain               Plan - 06/02/15 1309     Clinical Impression Statement Pt with min incr pain today when compared to last week.  Will assess further next session.  However, pt continues to demo improvement overall.   Plan check remaining goals, discuss continue vs. d/c next session.   OT Home Exercise Plan education provided:  initial HEP; 04/28/15 cane ex for shoulder abduction, ER, issued PWR! up, twist and rock in supine   Consulted and Agree with Plan of Care Patient      Patient will benefit from skilled therapeutic intervention in order to improve the following deficits and impairments:     Visit Diagnosis: Pain in right shoulder  Stiffness of right shoulder, not elsewhere classified  Abnormal posture  Other symptoms and signs involving the nervous system  Other symptoms and signs involving the musculoskeletal system    Problem List Patient Active Problem List   Diagnosis Date Noted  . Headache 05/11/2015  . Abnormal glucose 04/28/2015  . Globus sensation 04/24/2015  . Foot pain 02/25/2015  . Skin tear of forearm without complication 00/51/1021  . Skin lesion 12/10/2014  . Muscle pain 07/20/2014  . Shoulder pain 04/17/2014  . PD (Parkinson's disease) ([place]) 06/05/2013  . Tremor 05/18/2013  . Irritation of ear 05/18/2013  . Medicare annual wellness visit, subsequent 05/02/2012  . Neck pain 05/02/2012  . Osteoporosis 03/08/2011  . Neck mass 03/08/2011  . Breast cancer ([place]) 11/19/2010  . GERD (gastroesophageal reflux disease) 08/01/2008  . DYSPHAGIA UNSPECIFIED 08/01/2008  . ORTHOSTATIC HYPOTENSION 06/28/2008  . Goiter 07/10/2007  . HLD (hyperlipidemia) 07/10/2007  . MONOCLONAL GAMMOPATHY 07/10/2007  . ANEMIA-NOS 07/10/2007  . FATIGUE 07/10/2007    [facility] 06/02/2015, 1:10 PM  [place] [address] [place], [name], [address] Phone: [phone]   Fax:  [phone]  Name: [name] MRN: [id number] Date of Birth:  Jun 09, 1940  Vianne Bulls, OTR/L [facility] [address] [place], [place]  [address] [phone] phone [phone] 06/02/2015 1:10 PM

## 2015-06-03 ENCOUNTER — Ambulatory Visit: Payer: Medicare Other | Admitting: Occupational Therapy

## 2015-06-05 ENCOUNTER — Ambulatory Visit: Payer: Medicare Other | Admitting: Occupational Therapy

## 2015-06-05 DIAGNOSIS — M25611 Stiffness of right shoulder, not elsewhere classified: Secondary | ICD-10-CM

## 2015-06-05 DIAGNOSIS — R29818 Other symptoms and signs involving the nervous system: Secondary | ICD-10-CM | POA: Diagnosis not present

## 2015-06-05 DIAGNOSIS — R29898 Other symptoms and signs involving the musculoskeletal system: Secondary | ICD-10-CM | POA: Diagnosis not present

## 2015-06-05 DIAGNOSIS — M25511 Pain in right shoulder: Secondary | ICD-10-CM

## 2015-06-05 DIAGNOSIS — R2681 Unsteadiness on feet: Secondary | ICD-10-CM | POA: Diagnosis not present

## 2015-06-05 DIAGNOSIS — R293 Abnormal posture: Secondary | ICD-10-CM

## 2015-06-05 NOTE — Therapy (Signed)
[facility] [address] [place], [name], [address] Phone: [phone]   Fax:  [phone]  Occupational Therapy Treatment  Patient Details  Name: [name] MRN: [id number] Date of Birth: 1940-09-21 Referring Provider: Dr. Wells Guiles Tat  Encounter Date: 06/05/2015      OT End of Session - 06/05/15 0854    Visit Number 16   Number of Visits 17  16+8=24   Date for OT Re-Evaluation 07/08/15   Authorization Type Medicare, BCBS (g-code needed)   Authorization Time Period re-cert 06/27/35-06/26/81*; renewal 1/51/76 with cert. through 08/05/15   Authorization - Visit Number 16   Authorization - Number of Visits 20   OT Start Time [phone]   OT Stop Time 0930   OT Time Calculation (min) 38 min   Activity Tolerance Patient tolerated treatment well   Behavior During Therapy [facility] for tasks assessed/performed      Past Medical History  Diagnosis Date  . Esophageal Stricture   . Monoclonal gammopathy   . Anemia     Anemia-NOS / PMH, Dr Julien Nordmann  . Hyperlipidemia   . Vertical diplopia   . MVP (mitral valve prolapse)   . Breast cancer (HCC)     stage I right breast  . Skin cancer     basal cell L neck  . Allergy     SEASONAL  . Arthritis   . Cataract     BILATERAL  . Osteoporosis   . Decreased vision     R eye    Past Surgical History  Procedure Laterality Date  . Total abdominal hysterectomy w/ bilateral salpingoophorectomy  75 yrs old    for pain,prolapse ; G3 P2  . Salpingoophorectomy  2002    for benign growths  . Hemorrhoid surgery  1970's  . Appendectomy  2002  . Cardiac catheterization  2005    neg  . Colonoscopy      negative   . Bone biopsy  11/06/10  . Breast lumpectomy  02/2010    right breast lumpectomy and sentinel node biopsy  . Upper gastrointestinal endoscopy      There were no vitals filed for this visit.      Subjective Assessment - 06/05/15 0857    Subjective  My shoulder hurt  some this morning when I was getting dressed (0-3/10)   Pertinent History hx of R breast CA with lumpectomy and lymph node removal and radiation 2012; multiple myeloma, osteoporesis   Patient Stated Goals improve R shoulder pain   Currently in Pain? Yes   Pain Score --  0-3/10   Pain Location Shoulder   Pain Orientation Right   Pain Descriptors / Indicators Aching   Pain Type Chronic pain   Pain Frequency Intermittent   Aggravating Factors  high reaching    Pain Relieving Factors rest            Neuro re-ed:  PWR! Moves (up, rock, twist) in supine x 10-20 each with min cues intermittently For incr movement amplitude (hands), PWR! (rock) in modified quadraped with min cues and PWR! Twist in modified quadraped in available ROM.  Supine and sitting, Shoulder flex with ball in supine and with min v.c. For posture/positioning, elbow ext   Supine and sitting, shoulder abduction with cane for AAROM.  In supine, shoulder flex and chest press with 1lb weighted ball.  Then diagonals to each side with 1lb wt. Ball.    In sitting, shoulder flex x10 each (shoulder flex to  90*) with 1lb weighted ball.  Arm bike x24mn level 1 for reciprocal movement   Checked goals and discussed progress.  Pt desires to continue OT to work on dressing without pain (pulling down shirt in back and twisting bra around), tolerate strengthening without pain.                             OT Short Term Goals - 05/05/15 1521    OT SHORT TERM GOAL #1   Title Pt will be independent with initial HEP--check STGs 05/08/15   Time 4   Period Weeks   Status Achieved  04/28/15    OT SHORT TERM GOAL #2   Title Pt will demo at least 105* R shoulder flex with 3/10 pain or less.   Baseline 90* with 6/10 pain   Time 4   Period Weeks   Status Achieved  05/05/15:  125* with 2/10 pain   OT SHORT TERM GOAL #3   Title Pt will report RUE pain less than or equal to 5/10 for ADLs.   Time 4   Period Weeks    Status Achieved  0-4/10   OT SHORT TERM GOAL #4   Title Pt will demo at least 85* R shoulder abduction for ADLs.   Baseline 70*   Time 4   Period Weeks   Status Achieved  05/05/15:  90* with 4/10 pain           OT Long Term Goals - 06/05/15 0911    OT LONG TERM GOAL #1   Title Pt will verbalize understanding of updated strategies to increase ease, safety, and independence and decr pain with ADLs/IADLs prn.--check LTGs 06/07/15   Time 8   Period Weeks   Status Achieved  06/05/15:  met   OT LONG TERM GOAL #2   Title Pt will improve LUE functional reaching/coordination as shown by improving time on box and blocks test by at least 4.   Baseline R-51 blocks, L-51 blocks   Status On-going   OT LONG TERM GOAL #3   Title Pt will improve balance for ADLs/IADLs as shown by improving standing functional reach test by 2 inches bilaterally.   Baseline 8 inches bilaterally   Time 8   Period Weeks   Status Achieved  06/05/15:  R-10", L-11"   OT LONG TERM GOAL #4   Title Pt will improve RUE functional reaching/coordination as shown by improving time on box and blocks test by at least 5.   Baseline R-51 blocks, L-51 blocks   Time 8   Period Weeks   Status Achieved  06/05/15: 56 blocks   OT LONG TERM GOAL #5   Title Pt will demo at least 135* R shoulder flex with 3/10 pain or less.   Baseline 90* with 6/10 pain   Time 8   Period Weeks   Status On-going  05/05/15 upgraded goal as pt met original goal;  06/02/15  132* with 4/10 pain   Long Term Additional Goals   Additional Long Term Goals Yes   OT LONG TERM GOAL #6   Title Pt will demo at least 100* R shoulder abduction for ADLs.   Baseline 70* with pain   Time 8   Period Weeks   Status Achieved  06/02/15:  120* with 4/10 pain   OT LONG TERM GOAL #7   Title Pt will be independent with updated HEP.--.--check updated LTGs 07/06/15   Time 4  Period Weeks   Status New   OT LONG TERM GOAL #8   Title Pt will reports ability to don bra and  pull-over shirt with 1/10 or less pain..--check updated LTGs 07/06/15   Time 4   Period Weeks   Status New   OT LONG TERM GOAL  #9   Baseline Pt will be able to retrieve 2lb object from overhead shelf with 2/10 or less pain.--check updated LTGs 07/06/15   Time 4   Period Weeks   Status New   OT LONG TERM GOAL  #10   TITLE Pt will be able to wt. bear through RUE in quadraped in order to resume previous PD HEP with 2/10 pain or less.   Time 4   Period Weeks   Status New         Patient will benefit from skilled therapeutic intervention in order to improve the following deficits and impairments:  Pain, Impaired UE functional use, Decreased knowledge of use of DME, Decreased activity tolerance, Impaired tone, Decreased mobility, Decreased strength, Decreased coordination, Decreased range of motion, Improper body mechanics  Visit Diagnosis: Pain in right shoulder  Stiffness of right shoulder, not elsewhere classified  Abnormal posture  Other symptoms and signs involving the nervous system  Other symptoms and signs involving the musculoskeletal system    Problem List Patient Active Problem List   Diagnosis Date Noted  . Headache 05/11/2015  . Abnormal glucose 04/28/2015  . Globus sensation 04/24/2015  . Foot pain 02/25/2015  . Skin tear of forearm without complication 46/56/8127  . Skin lesion 12/10/2014  . Muscle pain 07/20/2014  . Shoulder pain 04/17/2014  . PD (Parkinson's disease) ([place]) 06/05/2013  . Tremor 05/18/2013  . Irritation of ear 05/18/2013  . Medicare annual wellness visit, subsequent 05/02/2012  . Neck pain 05/02/2012  . Osteoporosis 03/08/2011  . Neck mass 03/08/2011  . Breast cancer ([place]) 11/19/2010  . GERD (gastroesophageal reflux disease) 08/01/2008  . DYSPHAGIA UNSPECIFIED 08/01/2008  . ORTHOSTATIC HYPOTENSION 06/28/2008  . Goiter 07/10/2007  . HLD (hyperlipidemia) 07/10/2007  . MONOCLONAL GAMMOPATHY 07/10/2007  . ANEMIA-NOS 07/10/2007  .  FATIGUE 07/10/2007    [facility] 06/05/2015, 6:02 PM  [facility] [address] [place], [name], [address] Phone: [phone]   Fax:  [phone]  Name: [name] MRN: [id number] Date of Birth: 1940/08/22  Vianne Bulls, OTR/L [facility] [address] [place], [place]  [address] [phone] phone [phone] 06/05/2015 6:02 PM

## 2015-06-09 ENCOUNTER — Ambulatory Visit: Payer: Medicare Other | Admitting: Occupational Therapy

## 2015-06-09 DIAGNOSIS — R293 Abnormal posture: Secondary | ICD-10-CM | POA: Diagnosis not present

## 2015-06-09 DIAGNOSIS — M25511 Pain in right shoulder: Secondary | ICD-10-CM

## 2015-06-09 DIAGNOSIS — R29898 Other symptoms and signs involving the musculoskeletal system: Secondary | ICD-10-CM | POA: Diagnosis not present

## 2015-06-09 DIAGNOSIS — M25611 Stiffness of right shoulder, not elsewhere classified: Secondary | ICD-10-CM | POA: Diagnosis not present

## 2015-06-09 DIAGNOSIS — R2681 Unsteadiness on feet: Secondary | ICD-10-CM | POA: Diagnosis not present

## 2015-06-09 DIAGNOSIS — R29818 Other symptoms and signs involving the nervous system: Secondary | ICD-10-CM | POA: Diagnosis not present

## 2015-06-09 NOTE — Therapy (Signed)
[facility] [address] [place], [name], [address] Phone: [phone]   Fax:  [phone]  Occupational Therapy Treatment  Patient Details  Name: [name] MRN: [id number] Date of Birth: [birth year] Referring Provider: Dr. Wells Guiles Tat  Encounter Date: 06/09/2015      OT End of Session - 06/09/15 1010    Visit Number 17   Number of Visits 24  16+8=24   Date for OT Re-Evaluation 07/08/15   Authorization Type Medicare, BCBS (g-code needed)   Authorization Time Period re-cert 03/29/57-07/29/14*; renewal 3/84/53 with cert. through 08/05/15   Authorization - Visit Number 17   Authorization - Number of Visits 20   OT Start Time 6468   OT Stop Time 1100   OT Time Calculation (min) 45 min   Activity Tolerance Patient tolerated treatment well   Behavior During Therapy [facility] for tasks assessed/performed      Past Medical History  Diagnosis Date  . Esophageal Stricture   . Monoclonal gammopathy   . Anemia     Anemia-NOS / PMH, Dr Julien Nordmann  . Hyperlipidemia   . Vertical diplopia   . MVP (mitral valve prolapse)   . Breast cancer (HCC)     stage I right breast  . Skin cancer     basal cell L neck  . Allergy     SEASONAL  . Arthritis   . Cataract     BILATERAL  . Osteoporosis   . Decreased vision     R eye    Past Surgical History  Procedure Laterality Date  . Total abdominal hysterectomy w/ bilateral salpingoophorectomy  75 yrs old    for pain,prolapse ; G3 P2  . Salpingoophorectomy  2002    for benign growths  . Hemorrhoid surgery  1970's  . Appendectomy  2002  . Cardiac catheterization  2005    neg  . Colonoscopy      negative   . Bone biopsy  11/06/10  . Breast lumpectomy  02/2010    right breast lumpectomy and sentinel node biopsy  . Upper gastrointestinal endoscopy      There were no vitals filed for this visit.      Subjective Assessment - 06/09/15 1024    Subjective  Pt reports that pain  is "not bad" today   Pertinent History hx of R breast CA with lumpectomy and lymph node removal and radiation 2012; multiple myeloma, osteoporesis   Patient Stated Goals improve R shoulder pain   Currently in Pain? Yes   Pain Score 1    Pain Location Shoulder   Pain Orientation Right   Pain Descriptors / Indicators Aching   Pain Type Chronic pain   Pain Onset More than a month ago   Pain Frequency Intermittent   Aggravating Factors  high reaching, resistive movements   Pain Relieving Factors rest         Neuro re-ed:  PWR! Moves (up, rock, twist) in supine x 10-20 each with min cues intermittently For incr movement amplitude (hands)  Supine, Shoulder flex with ball in supine and with min v.c. For posture/positioning, elbow ext   Supine, shoulder abduction with cane for AAROM.  In supine, shoulder flex and chest press with 2lb weighted ball.  Then diagonals to each side with 2lb wt. Ball.    In sitting, shoulder flex and diagonals to each side x10 each (shoulder flex to 90*) with 1lb weighted ball.  Arm bike x58mn level 3 for reciprocal movement/stengthening (  forward/backwards)  Functional reaching overhead with RUE and LUE incorporating trunk rotation (reaching in ER, across body, and overhead) to place small pegs in vertical pegboard.                     OT Education - 06/09/15 1030    Education Details Yellow theraband HEP (low range)   Person(s) Educated Patient   Methods Explanation;Demonstration;Verbal cues   Comprehension Verbalized understanding;Returned demonstration          OT Short Term Goals - 05/05/15 1521    OT SHORT TERM GOAL #1   Title Pt will be independent with initial HEP--check STGs 05/08/15   Time 4   Period Weeks   Status Achieved  04/28/15    OT SHORT TERM GOAL #2   Title Pt will demo at least 105* R shoulder flex with 3/10 pain or less.   Baseline 90* with 6/10 pain   Time 4   Period Weeks   Status Achieved  05/05/15:  125*  with 2/10 pain   OT SHORT TERM GOAL #3   Title Pt will report RUE pain less than or equal to 5/10 for ADLs.   Time 4   Period Weeks   Status Achieved  0-4/10   OT SHORT TERM GOAL #4   Title Pt will demo at least 85* R shoulder abduction for ADLs.   Baseline 70*   Time 4   Period Weeks   Status Achieved  05/05/15:  90* with 4/10 pain           OT Long Term Goals - 06/05/15 0911    OT LONG TERM GOAL #1   Title Pt will verbalize understanding of updated strategies to increase ease, safety, and independence and decr pain with ADLs/IADLs prn.--check LTGs 06/07/15   Time 8   Period Weeks   Status Achieved  06/05/15:  met   OT LONG TERM GOAL #2   Title Pt will improve LUE functional reaching/coordination as shown by improving time on box and blocks test by at least 4.   Baseline R-51 blocks, L-51 blocks   Status On-going   OT LONG TERM GOAL #3   Title Pt will improve balance for ADLs/IADLs as shown by improving standing functional reach test by 2 inches bilaterally.   Baseline 8 inches bilaterally   Time 8   Period Weeks   Status Achieved  06/05/15:  R-10", L-11"   OT LONG TERM GOAL #4   Title Pt will improve RUE functional reaching/coordination as shown by improving time on box and blocks test by at least 5.   Baseline R-51 blocks, L-51 blocks   Time 8   Period Weeks   Status Achieved  06/05/15: 56 blocks   OT LONG TERM GOAL #5   Title Pt will demo at least 135* R shoulder flex with 3/10 pain or less.   Baseline 90* with 6/10 pain   Time 8   Period Weeks   Status On-going  05/05/15 upgraded goal as pt met original goal;  06/02/15  132* with 4/10 pain   Long Term Additional Goals   Additional Long Term Goals Yes   OT LONG TERM GOAL #6   Title Pt will demo at least 100* R shoulder abduction for ADLs.   Baseline 70* with pain   Time 8   Period Weeks   Status Achieved  06/02/15:  120* with 4/10 pain   OT LONG TERM GOAL #7   Title Pt will  be independent with updated  HEP.--.--check updated LTGs 07/06/15   Time 4   Period Weeks   Status New   OT LONG TERM GOAL #8   Title Pt will reports ability to don bra and pull-over shirt with 1/10 or less pain..--check updated LTGs 07/06/15   Time 4   Period Weeks   Status New   OT LONG TERM GOAL  #9   Baseline Pt will be able to retrieve 2lb object from overhead shelf with 2/10 or less pain.--check updated LTGs 07/06/15   Time 4   Period Weeks   Status New   OT LONG TERM GOAL  #10   TITLE Pt will be able to wt. bear through RUE in quadraped in order to resume previous PD HEP with 2/10 pain or less.   Time 4   Period Weeks   Status New               Plan - 06/09/15 1028    Clinical Impression Statement Pt continues to progress towards goals and is tolerating incr resistance.   Rehab Potential Good   Clinical Impairments Affecting Rehab Potential R shoulder pain   OT Frequency 2x / week   OT Duration 4 weeks   OT Treatment/Interventions Moist Heat;Passive range of motion;Electrical Stimulation;Therapeutic activities;DME and/or AE instruction;Cryotherapy;Therapeutic exercises;Neuromuscular education;Ultrasound;Self-care/ADL training;Therapeutic exercise;Functional Mobility Training;Patient/family education;Manual Therapy   Plan review theraband HEP   OT Home Exercise Plan education provided:  initial HEP; 04/28/15 cane ex for shoulder abduction, ER, issued PWR! up, twist and rock in supine; theraband HEP (yellow) 06/09/15   Consulted and Agree with Plan of Care Patient      Patient will benefit from skilled therapeutic intervention in order to improve the following deficits and impairments:  Pain, Impaired UE functional use, Decreased knowledge of use of DME, Decreased activity tolerance, Impaired tone, Decreased mobility, Decreased strength, Decreased coordination, Decreased range of motion, Improper body mechanics  Visit Diagnosis: Pain in right shoulder  Stiffness of right shoulder, not elsewhere  classified  Abnormal posture  Other symptoms and signs involving the nervous system  Other symptoms and signs involving the musculoskeletal system    Problem List Patient Active Problem List   Diagnosis Date Noted  . Headache 05/11/2015  . Abnormal glucose 04/28/2015  . Globus sensation 04/24/2015  . Foot pain 02/25/2015  . Skin tear of forearm without complication 86/76/7209  . Skin lesion 12/10/2014  . Muscle pain 07/20/2014  . Shoulder pain 04/17/2014  . PD (Parkinson's disease) ([place]) 06/05/2013  . Tremor 05/18/2013  . Irritation of ear 05/18/2013  . Medicare annual wellness visit, subsequent 05/02/2012  . Neck pain 05/02/2012  . Osteoporosis 03/08/2011  . Neck mass 03/08/2011  . Breast cancer ([place]) 11/19/2010  . GERD (gastroesophageal reflux disease) 08/01/2008  . DYSPHAGIA UNSPECIFIED 08/01/2008  . ORTHOSTATIC HYPOTENSION 06/28/2008  . Goiter 07/10/2007  . HLD (hyperlipidemia) 07/10/2007  . MONOCLONAL GAMMOPATHY 07/10/2007  . ANEMIA-NOS 07/10/2007  . FATIGUE 07/10/2007    [facility] 06/09/2015, 10:58 AM  [facility] [address] [place], [name], [address] Phone: [phone]   Fax:  [phone]  Name: [name] MRN: [id number] Date of Birth: [birth year]  Vianne Bulls, OTR/L [facility] [address] [place], [place]  [address] [phone] phone [phone] 06/09/2015 10:58 AM

## 2015-06-09 NOTE — Patient Instructions (Signed)
Strengthening: Resisted Flexion   Attach tube to door.  Hold tubing with one arm at side. Pull forward and up. Move shoulder through pain-free range of motion. Repeat 10 times per set.  Do 1 sessions per day.    Strengthening: Resisted Extension   Attach one end to door.  Hold tubing in one hand, arm forward. Pull arm back, elbow straight. Repeat 10 times per set. Do 1 sessions per day.   Resisted Horizontal Abduction: Bilateral   Sit or stand, tubing in both hands, arms out in front. Keeping arms straight, pinch shoulder blades together and stretch arms out.--within pain tolerance Repeat 10 times per set.  Do 1 sessions per day.    Internal Rotation (Eccentric), (Resistance Band)    Hold band with hand of affected arm.  pull band inward. Keep wrist neutral. Slowly return for 3-5 seconds. Use yellow resistance band. Hint: Use towel roll between elbow and waist or elbow and hip. 10 reps per set,1  sets per day  http://ecce.exer.us/211   Copyright  VHI. All rights reserved.  EXTERNAL ROTATION: Standing - Stable: Exercise Band (Active)    Stand, right arm bent to 90, elbow against side, hand forward. Against yellow resistance band, rotate forearm outward, keeping elbow at side. Rotate forearm outward as far as possible. Complete 10 repetitions. Perform 1 sessions per day.  Copyright  VHI. All rights reserved.

## 2015-06-13 ENCOUNTER — Ambulatory Visit: Payer: Medicare Other | Admitting: Occupational Therapy

## 2015-06-13 DIAGNOSIS — M25611 Stiffness of right shoulder, not elsewhere classified: Secondary | ICD-10-CM

## 2015-06-13 DIAGNOSIS — M25511 Pain in right shoulder: Secondary | ICD-10-CM | POA: Diagnosis not present

## 2015-06-13 DIAGNOSIS — R293 Abnormal posture: Secondary | ICD-10-CM | POA: Diagnosis not present

## 2015-06-13 DIAGNOSIS — R29818 Other symptoms and signs involving the nervous system: Secondary | ICD-10-CM | POA: Diagnosis not present

## 2015-06-13 DIAGNOSIS — R29898 Other symptoms and signs involving the musculoskeletal system: Secondary | ICD-10-CM

## 2015-06-13 DIAGNOSIS — R2681 Unsteadiness on feet: Secondary | ICD-10-CM | POA: Diagnosis not present

## 2015-06-13 NOTE — Therapy (Signed)
[facility] [address] [place], [name], [address] Phone: [phone]   Fax:  [phone]  Occupational Therapy Evaluation  Patient Details  Name: [name] MRN: [id number] Date of Birth: September 15, 1940 Referring Provider: Dr. Wells Guiles Tat  Encounter Date: 06/13/2015      OT End of Session - 06/13/15 1250    Visit Number 18   Number of Visits 24   Date for OT Re-Evaluation 07/08/15   Authorization Type Medicare, BCBS (g-code needed)   Authorization Time Period re-cert 07/26/22-06/02/07*; renewal 9/83/38 with cert. through 08/05/15   Authorization - Visit Number 18   Authorization - Number of Visits 20   OT Start Time 1150   OT Stop Time 1232   OT Time Calculation (min) 42 min   Activity Tolerance Patient tolerated treatment well   Behavior During Therapy WFL for tasks assessed/performed      Past Medical History  Diagnosis Date  . Esophageal Stricture   . Monoclonal gammopathy   . Anemia     Anemia-NOS / PMH, Dr Julien Nordmann  . Hyperlipidemia   . Vertical diplopia   . MVP (mitral valve prolapse)   . Breast cancer (HCC)     stage I right breast  . Skin cancer     basal cell L neck  . Allergy     SEASONAL  . Arthritis   . Cataract     BILATERAL  . Osteoporosis   . Decreased vision     R eye    Past Surgical History  Procedure Laterality Date  . Total abdominal hysterectomy w/ bilateral salpingoophorectomy  75 yrs old    for pain,prolapse ; G3 P2  . Salpingoophorectomy  2002    for benign growths  . Hemorrhoid surgery  1970's  . Appendectomy  2002  . Cardiac catheterization  2005    neg  . Colonoscopy      negative   . Bone biopsy  11/06/10  . Breast lumpectomy  02/2010    right breast lumpectomy and sentinel node biopsy  . Upper gastrointestinal endoscopy      There were no vitals filed for this visit.            Supine, Shoulder flex with ball in supine and with min v.c. For  posture/positioning, elbow ext   Supine, shoulder abduction with cane for AAROM.  In supine, shoulder flex and chest press with 2lb weighted ball.  Then diagonals to each side with 2lb wt. Ball.    In sitting, shoulder flex and diagonals to each side x10 each (shoulder flex to 90*) with 2lb weighted ball.(Pt reported shoulder pain after performing shoulder flexion with 2 lbs ball)  Arm bike x41mn level 3 for reciprocal movement/stengthening (forward/backwards)  Functional reaching overhead with RUE and LUE incorporating trunk rotation (reaching in ER, across body, and overhead) to place clothespins on antennae. Reviewed yellow theraband HEP. Pt returned demonstration 10 reps each after initial demo.                 OT Short Term Goals - 05/05/15 1521    OT SHORT TERM GOAL #1   Title Pt will be independent with initial HEP--check STGs 05/08/15   Time 4   Period Weeks   Status Achieved  04/28/15    OT SHORT TERM GOAL #2   Title Pt will demo at least 105* R shoulder flex with 3/10 pain or less.   Baseline 90* with 6/10 pain  Time 4   Period Weeks   Status Achieved  05/05/15:  125* with 2/10 pain   OT SHORT TERM GOAL #3   Title Pt will report RUE pain less than or equal to 5/10 for ADLs.   Time 4   Period Weeks   Status Achieved  0-4/10   OT SHORT TERM GOAL #4   Title Pt will demo at least 85* R shoulder abduction for ADLs.   Baseline 70*   Time 4   Period Weeks   Status Achieved  05/05/15:  90* with 4/10 pain           OT Long Term Goals - 06/05/15 0911    OT LONG TERM GOAL #1   Title Pt will verbalize understanding of updated strategies to increase ease, safety, and independence and decr pain with ADLs/IADLs prn.--check LTGs 06/07/15   Time 8   Period Weeks   Status Achieved  06/05/15:  met   OT LONG TERM GOAL #2   Title Pt will improve LUE functional reaching/coordination as shown by improving time on box and blocks test by at least 4.   Baseline R-51  blocks, L-51 blocks   Status On-going   OT LONG TERM GOAL #3   Title Pt will improve balance for ADLs/IADLs as shown by improving standing functional reach test by 2 inches bilaterally.   Baseline 8 inches bilaterally   Time 8   Period Weeks   Status Achieved  06/05/15:  R-10", L-11"   OT LONG TERM GOAL #4   Title Pt will improve RUE functional reaching/coordination as shown by improving time on box and blocks test by at least 5.   Baseline R-51 blocks, L-51 blocks   Time 8   Period Weeks   Status Achieved  06/05/15: 56 blocks   OT LONG TERM GOAL #5   Title Pt will demo at least 135* R shoulder flex with 3/10 pain or less.   Baseline 90* with 6/10 pain   Time 8   Period Weeks   Status On-going  05/05/15 upgraded goal as pt met original goal;  06/02/15  132* with 4/10 pain   Long Term Additional Goals   Additional Long Term Goals Yes   OT LONG TERM GOAL #6   Title Pt will demo at least 100* R shoulder abduction for ADLs.   Baseline 70* with pain   Time 8   Period Weeks   Status Achieved  06/02/15:  120* with 4/10 pain   OT LONG TERM GOAL #7   Title Pt will be independent with updated HEP.--.--check updated LTGs 07/06/15   Time 4   Period Weeks   Status New   OT LONG TERM GOAL #8   Title Pt will reports ability to don bra and pull-over shirt with 1/10 or less pain..--check updated LTGs 07/06/15   Time 4   Period Weeks   Status New   OT LONG TERM GOAL  #9   Baseline Pt will be able to retrieve 2lb object from overhead shelf with 2/10 or less pain.--check updated LTGs 07/06/15   Time 4   Period Weeks   Status New   OT LONG TERM GOAL  #10   TITLE Pt will be able to wt. bear through RUE in quadraped in order to resume previous PD HEP with 2/10 pain or less.   Time 4   Period Weeks   Status New                 Plan - 06/13/15 1248    Clinical Impression Statement Pt is progressing towards goals. she demonstrates good understanding of HEP   Clinical Impairments Affecting  Rehab Potential R shoulder pain   OT Frequency 2x / week   OT Duration 4 weeks   OT Treatment/Interventions Moist Heat;Passive range of motion;Electrical Stimulation;Therapeutic activities;DME and/or AE instruction;Cryotherapy;Therapeutic exercises;Neuromuscular education;Ultrasound;Self-care/ADL training;Therapeutic exercise;Functional Mobility Training;Patient/family education;Manual Therapy   Plan continue to address RUE shoulder pain and A/ROM for ADLs   OT Home Exercise Plan education provided:  initial HEP; 04/28/15 cane ex for shoulder abduction, ER, issued PWR! up, twist and rock in supine; theraband HEP (yellow) 06/09/15   Consulted and Agree with Plan of Care Patient      Patient will benefit from skilled therapeutic intervention in order to improve the following deficits and impairments:  Pain, Impaired UE functional use, Decreased knowledge of use of DME, Decreased activity tolerance, Impaired tone, Decreased mobility, Decreased strength, Decreased coordination, Decreased range of motion, Improper body mechanics  Visit Diagnosis: Pain in right shoulder  Stiffness of right shoulder, not elsewhere classified  Abnormal posture  Other symptoms and signs involving the nervous system  Other symptoms and signs involving the musculoskeletal system    Problem List Patient Active Problem List   Diagnosis Date Noted  . Headache 05/11/2015  . Abnormal glucose 04/28/2015  . Globus sensation 04/24/2015  . Foot pain 02/25/2015  . Skin tear of forearm without complication 92/11/9415  . Skin lesion 12/10/2014  . Muscle pain 07/20/2014  . Shoulder pain 04/17/2014  . PD (Parkinson's disease) ([place]) 06/05/2013  . Tremor 05/18/2013  . Irritation of ear 05/18/2013  . Medicare annual wellness visit, subsequent 05/02/2012  . Neck pain 05/02/2012  . Osteoporosis 03/08/2011  . Neck mass 03/08/2011  . Breast cancer ([place]) 11/19/2010  . GERD (gastroesophageal reflux disease) 08/01/2008  .  DYSPHAGIA UNSPECIFIED 08/01/2008  . ORTHOSTATIC HYPOTENSION 06/28/2008  . Goiter 07/10/2007  . HLD (hyperlipidemia) 07/10/2007  . MONOCLONAL GAMMOPATHY 07/10/2007  . ANEMIA-NOS 07/10/2007  . FATIGUE 07/10/2007    RINE,KATHRYN 06/13/2015, 12:52 PM Theone Murdoch, OTR/L [phone] Phone: [phone] 12:52 PM 06/13/2015 [facility] [address] [place], [name], [address] Phone: [phone]   Fax:  [phone]  Name: [name] MRN: [id number] Date of Birth: [birth year]

## 2015-06-16 ENCOUNTER — Encounter: Payer: Self-pay | Admitting: Physical Therapy

## 2015-06-16 ENCOUNTER — Ambulatory Visit: Payer: Medicare Other | Admitting: Occupational Therapy

## 2015-06-16 ENCOUNTER — Ambulatory Visit: Payer: Medicare Other | Admitting: Physical Therapy

## 2015-06-16 DIAGNOSIS — R2689 Other abnormalities of gait and mobility: Secondary | ICD-10-CM

## 2015-06-16 DIAGNOSIS — M25511 Pain in right shoulder: Secondary | ICD-10-CM

## 2015-06-16 DIAGNOSIS — M25611 Stiffness of right shoulder, not elsewhere classified: Secondary | ICD-10-CM | POA: Diagnosis not present

## 2015-06-16 DIAGNOSIS — R29898 Other symptoms and signs involving the musculoskeletal system: Secondary | ICD-10-CM

## 2015-06-16 DIAGNOSIS — R293 Abnormal posture: Secondary | ICD-10-CM | POA: Diagnosis not present

## 2015-06-16 DIAGNOSIS — R29818 Other symptoms and signs involving the nervous system: Secondary | ICD-10-CM

## 2015-06-16 DIAGNOSIS — R42 Dizziness and giddiness: Secondary | ICD-10-CM

## 2015-06-16 DIAGNOSIS — R2681 Unsteadiness on feet: Secondary | ICD-10-CM | POA: Diagnosis not present

## 2015-06-16 NOTE — Therapy (Signed)
[facility] [address] [place], [name], [address] Phone: [phone]   Fax:  [phone]  Occupational Therapy Treatment  Patient Details  Name: [name] MRN: [id number] Date of Birth: Jul 18, 1940 Referring Provider: Dr. Wells Guiles Tat  Encounter Date: 06/16/2015      OT End of Session - 06/16/15 0811    Visit Number 19   Number of Visits 24   Date for OT Re-Evaluation 07/08/15   Authorization Type Medicare, BCBS (g-code needed)   Authorization Time Period re-cert 03/03/01-5/0/09*; renewal 3/81/82 with cert. through 08/05/15   Authorization - Visit Number 48   Authorization - Number of Visits 20   OT Start Time 0804   OT Stop Time 0845   OT Time Calculation (min) 41 min   Activity Tolerance Patient tolerated treatment well   Behavior During Therapy [facility] for tasks assessed/performed      Past Medical History  Diagnosis Date  . Esophageal Stricture   . Monoclonal gammopathy   . Anemia     Anemia-NOS / PMH, Dr Julien Nordmann  . Hyperlipidemia   . Vertical diplopia   . MVP (mitral valve prolapse)   . Breast cancer (HCC)     stage I right breast  . Skin cancer     basal cell L neck  . Allergy     SEASONAL  . Arthritis   . Cataract     BILATERAL  . Osteoporosis   . Decreased vision     R eye    Past Surgical History  Procedure Laterality Date  . Total abdominal hysterectomy w/ bilateral salpingoophorectomy  75 yrs old    for pain,prolapse ; G3 P2  . Salpingoophorectomy  2002    for benign growths  . Hemorrhoid surgery  1970's  . Appendectomy  2002  . Cardiac catheterization  2005    neg  . Colonoscopy      negative   . Bone biopsy  11/06/10  . Breast lumpectomy  02/2010    right breast lumpectomy and sentinel node biopsy  . Upper gastrointestinal endoscopy      There were no vitals filed for this visit.      Subjective Assessment - 06/16/15 0807    Subjective  Pt reports that theraband ex  are going well   Pertinent History hx of R breast CA with lumpectomy and lymph node removal and radiation 2012; multiple myeloma, osteoporesis   Patient Stated Goals improve R shoulder pain   Currently in Pain? Yes   Pain Score 1    Pain Location Shoulder   Pain Orientation Right   Pain Descriptors / Indicators Aching   Pain Type Chronic pain   Pain Onset More than a month ago   Aggravating Factors  high reaching, resistive movements   Pain Relieving Factors rest                                OT Short Term Goals - 05/05/15 1521    OT SHORT TERM GOAL #1   Title Pt will be independent with initial HEP--check STGs 05/08/15   Time 4   Period Weeks   Status Achieved  04/28/15    OT SHORT TERM GOAL #2   Title Pt will demo at least 105* R shoulder flex with 3/10 pain or less.   Baseline 90* with 6/10 pain   Time 4   Period Weeks   Status  Achieved  05/05/15:  125* with 2/10 pain   OT SHORT TERM GOAL #3   Title Pt will report RUE pain less than or equal to 5/10 for ADLs.   Time 4   Period Weeks   Status Achieved  0-4/10   OT SHORT TERM GOAL #4   Title Pt will demo at least 85* R shoulder abduction for ADLs.   Baseline 70*   Time 4   Period Weeks   Status Achieved  05/05/15:  90* with 4/10 pain           OT Long Term Goals - 06/05/15 0911    OT LONG TERM GOAL #1   Title Pt will verbalize understanding of updated strategies to increase ease, safety, and independence and decr pain with ADLs/IADLs prn.--check LTGs 06/07/15   Time 8   Period Weeks   Status Achieved  06/05/15:  met   OT LONG TERM GOAL #2   Title Pt will improve LUE functional reaching/coordination as shown by improving time on box and blocks test by at least 4.   Baseline R-51 blocks, L-51 blocks   Status On-going   OT LONG TERM GOAL #3   Title Pt will improve balance for ADLs/IADLs as shown by improving standing functional reach test by 2 inches bilaterally.   Baseline 8 inches  bilaterally   Time 8   Period Weeks   Status Achieved  06/05/15:  R-10", L-11"   OT LONG TERM GOAL #4   Title Pt will improve RUE functional reaching/coordination as shown by improving time on box and blocks test by at least 5.   Baseline R-51 blocks, L-51 blocks   Time 8   Period Weeks   Status Achieved  06/05/15: 56 blocks   OT LONG TERM GOAL #5   Title Pt will demo at least 135* R shoulder flex with 3/10 pain or less.   Baseline 90* with 6/10 pain   Time 8   Period Weeks   Status On-going  05/05/15 upgraded goal as pt met original goal;  06/02/15  132* with 4/10 pain   Long Term Additional Goals   Additional Long Term Goals Yes   OT LONG TERM GOAL #6   Title Pt will demo at least 100* R shoulder abduction for ADLs.   Baseline 70* with pain   Time 8   Period Weeks   Status Achieved  06/02/15:  120* with 4/10 pain   OT LONG TERM GOAL #7   Title Pt will be independent with updated HEP.--.--check updated LTGs 07/06/15   Time 4   Period Weeks   Status New   OT LONG TERM GOAL #8   Title Pt will reports ability to don bra and pull-over shirt with 1/10 or less pain..--check updated LTGs 07/06/15   Time 4   Period Weeks   Status New   OT LONG TERM GOAL  #9   Baseline Pt will be able to retrieve 2lb object from overhead shelf with 2/10 or less pain.--check updated LTGs 07/06/15   Time 4   Period Weeks   Status New   OT LONG TERM GOAL  #10   TITLE Pt will be able to wt. bear through RUE in quadraped in order to resume previous PD HEP with 2/10 pain or less.   Time 4   Period Weeks   Status New               Plan - 06/16/15 7517    Clinical  Impression Statement Pt continues to progress towards goals with incr tolerance with resistive activities.   Clinical Impairments Affecting Rehab Potential R shoulder pain   OT Frequency 2x / week   OT Duration 4 weeks   OT Treatment/Interventions Moist Heat;Passive range of motion;Electrical Stimulation;Therapeutic activities;DME  and/or AE instruction;Cryotherapy;Therapeutic exercises;Neuromuscular education;Ultrasound;Self-care/ADL training;Therapeutic exercise;Functional Mobility Training;Patient/family education;Manual Therapy   Plan continue to adress RUE shoulder pain and ADLs   OT Home Exercise Plan education provided:  initial HEP; 04/28/15 cane ex for shoulder abduction, ER, issued PWR! up, twist and rock in supine; theraband HEP (yellow) 06/09/15   Consulted and Agree with Plan of Care Patient      Patient will benefit from skilled therapeutic intervention in order to improve the following deficits and impairments:  Pain, Impaired UE functional use, Decreased knowledge of use of DME, Decreased activity tolerance, Impaired tone, Decreased mobility, Decreased strength, Decreased coordination, Decreased range of motion, Improper body mechanics  Visit Diagnosis: Pain in right shoulder  Stiffness of right shoulder, not elsewhere classified  Abnormal posture  Other symptoms and signs involving the nervous system  Other symptoms and signs involving the musculoskeletal system    Problem List Patient Active Problem List   Diagnosis Date Noted  . Headache 05/11/2015  . Abnormal glucose 04/28/2015  . Globus sensation 04/24/2015  . Foot pain 02/25/2015  . Skin tear of forearm without complication 64/40/3474  . Skin lesion 12/10/2014  . Muscle pain 07/20/2014  . Shoulder pain 04/17/2014  . PD (Parkinson's disease) ([place]) 06/05/2013  . Tremor 05/18/2013  . Irritation of ear 05/18/2013  . Medicare annual wellness visit, subsequent 05/02/2012  . Neck pain 05/02/2012  . Osteoporosis 03/08/2011  . Neck mass 03/08/2011  . Breast cancer ([place]) 11/19/2010  . GERD (gastroesophageal reflux disease) 08/01/2008  . DYSPHAGIA UNSPECIFIED 08/01/2008  . ORTHOSTATIC HYPOTENSION 06/28/2008  . Goiter 07/10/2007  . HLD (hyperlipidemia) 07/10/2007  . MONOCLONAL GAMMOPATHY 07/10/2007  . ANEMIA-NOS 07/10/2007  . FATIGUE  07/10/2007    [facility] 06/16/2015, 8:13 AM  [facility] [address] [place], [name], [address] Phone: [phone]   Fax:  [phone]  Name: [name] MRN: [id number] Date of Birth: 12-19-1940  [facility] [address] [place], [name], [address] Phone: [phone]   Fax:  [phone]  Occupational Therapy Treatment  Patient Details  Name: [name] MRN: [id number] Date of Birth: January 15, 1941 Referring Provider: Dr. Wells Guiles Tat  Encounter Date: 06/16/2015      OT End of Session - 06/16/15 0811    Visit Number 19   Number of Visits 24   Date for OT Re-Evaluation 07/08/15   Authorization Type Medicare, BCBS (g-code needed)   Authorization Time Period re-cert 1/0/62-07/04/46*; renewal 5/46/27 with cert. through 08/05/15   Authorization - Visit Number 74   Authorization - Number of Visits 20   OT Start Time 0804   OT Stop Time 0845   OT Time Calculation (min) 41 min   Activity Tolerance Patient tolerated treatment well   Behavior During Therapy [facility] for tasks assessed/performed      Past Medical History  Diagnosis Date  . Esophageal Stricture   . Monoclonal gammopathy   . Anemia     Anemia-NOS / PMH, Dr Julien Nordmann  . Hyperlipidemia   . Vertical diplopia   . MVP (mitral valve prolapse)   . Breast cancer ([place])     stage I right breast  . Skin cancer  basal cell L neck  . Allergy     SEASONAL  . Arthritis   . Cataract     BILATERAL  . Osteoporosis   . Decreased vision     R eye    Past Surgical History  Procedure Laterality Date  . Total abdominal hysterectomy w/ bilateral salpingoophorectomy  75 yrs old    for pain,prolapse ; G3 P2  . Salpingoophorectomy  2002    for benign growths  . Hemorrhoid surgery  1970's  . Appendectomy  2002  . Cardiac catheterization  2005    neg  . Colonoscopy      negative   .  Bone biopsy  11/06/10  . Breast lumpectomy  02/2010    right breast lumpectomy and sentinel node biopsy  . Upper gastrointestinal endoscopy      There were no vitals filed for this visit.      Subjective Assessment - 06/16/15 0807    Subjective  Pt reports that theraband ex are going well   Pertinent History hx of R breast CA with lumpectomy and lymph node removal and radiation 2012; multiple myeloma, osteoporesis   Patient Stated Goals improve R shoulder pain   Currently in Pain? Yes   Pain Score 1    Pain Location Shoulder   Pain Orientation Right   Pain Descriptors / Indicators Aching   Pain Type Chronic pain   Pain Onset More than a month ago   Aggravating Factors  high reaching, resistive movements   Pain Relieving Factors rest           PWR! Moves (up, rock, twist) in supine x 10-20 each with min cues intermittently For incr movement amplitude (hands), PWR! (up, rock, twist) in prone with min cues and PWR!    Supine, Shoulder flex with ball in supine and with min v.c. For posture/positioning, elbow ext   Supine, shoulder abduction with cane for AAROM.  In supine, shoulder flex and chest press with 2lb weighted ball.  Then diagonals to each side with 2lb wt. Ball.    In sitting, shoulder flex and chest press with 1lb weighted ball.  Then diagonals to each side with 2lb wt. Ball.    Functional reaching overhead with RUE and LUE incorporating trunk rotation (reaching in ER and overhead) to place small pegs in vertical pegboard.  Arm bike x68mn level 1 for reciprocal movement                                OT Short Term Goals - 05/05/15 1521    OT SHORT TERM GOAL #1   Title Pt will be independent with initial HEP--check STGs 05/08/15   Time 4   Period Weeks   Status Achieved  04/28/15    OT SHORT TERM GOAL #2   Title Pt will demo at least 105* R shoulder flex with 3/10 pain or less.   Baseline 90* with 6/10 pain   Time 4   Period Weeks    Status Achieved  05/05/15:  125* with 2/10 pain   OT SHORT TERM GOAL #3   Title Pt will report RUE pain less than or equal to 5/10 for ADLs.   Time 4   Period Weeks   Status Achieved  0-4/10   OT SHORT TERM GOAL #4   Title Pt will demo at least 85* R shoulder abduction for ADLs.   Baseline 70*   Time 4  Period Weeks   Status Achieved  05/05/15:  90* with 4/10 pain           OT Long Term Goals - 06/05/15 0911    OT LONG TERM GOAL #1   Title Pt will verbalize understanding of updated strategies to increase ease, safety, and independence and decr pain with ADLs/IADLs prn.--check LTGs 06/07/15   Time 8   Period Weeks   Status Achieved  06/05/15:  met   OT LONG TERM GOAL #2   Title Pt will improve LUE functional reaching/coordination as shown by improving time on box and blocks test by at least 4.   Baseline R-51 blocks, L-51 blocks   Status On-going   OT LONG TERM GOAL #3   Title Pt will improve balance for ADLs/IADLs as shown by improving standing functional reach test by 2 inches bilaterally.   Baseline 8 inches bilaterally   Time 8   Period Weeks   Status Achieved  06/05/15:  R-10", L-11"   OT LONG TERM GOAL #4   Title Pt will improve RUE functional reaching/coordination as shown by improving time on box and blocks test by at least 5.   Baseline R-51 blocks, L-51 blocks   Time 8   Period Weeks   Status Achieved  06/05/15: 56 blocks   OT LONG TERM GOAL #5   Title Pt will demo at least 135* R shoulder flex with 3/10 pain or less.   Baseline 90* with 6/10 pain   Time 8   Period Weeks   Status On-going  05/05/15 upgraded goal as pt met original goal;  06/02/15  132* with 4/10 pain   Long Term Additional Goals   Additional Long Term Goals Yes   OT LONG TERM GOAL #6   Title Pt will demo at least 100* R shoulder abduction for ADLs.   Baseline 70* with pain   Time 8   Period Weeks   Status Achieved  06/02/15:  120* with 4/10 pain   OT LONG TERM GOAL #7   Title Pt will be  independent with updated HEP.--.--check updated LTGs 07/06/15   Time 4   Period Weeks   Status New   OT LONG TERM GOAL #8   Title Pt will reports ability to don bra and pull-over shirt with 1/10 or less pain..--check updated LTGs 07/06/15   Time 4   Period Weeks   Status New   OT LONG TERM GOAL  #9   Baseline Pt will be able to retrieve 2lb object from overhead shelf with 2/10 or less pain.--check updated LTGs 07/06/15   Time 4   Period Weeks   Status New   OT LONG TERM GOAL  #10   TITLE Pt will be able to wt. bear through RUE in quadraped in order to resume previous PD HEP with 2/10 pain or less.   Time 4   Period Weeks   Status New         Patient will benefit from skilled therapeutic intervention in order to improve the following deficits and impairments:  Pain, Impaired UE functional use, Decreased knowledge of use of DME, Decreased activity tolerance, Impaired tone, Decreased mobility, Decreased strength, Decreased coordination, Decreased range of motion, Improper body mechanics  Visit Diagnosis: Pain in right shoulder  Stiffness of right shoulder, not elsewhere classified  Abnormal posture  Other symptoms and signs involving the nervous system  Other symptoms and signs involving the musculoskeletal system    Problem List Patient Active Problem  List   Diagnosis Date Noted  . Headache 05/11/2015  . Abnormal glucose 04/28/2015  . Globus sensation 04/24/2015  . Foot pain 02/25/2015  . Skin tear of forearm without complication 40/76/8088  . Skin lesion 12/10/2014  . Muscle pain 07/20/2014  . Shoulder pain 04/17/2014  . PD (Parkinson's disease) ([place]) 06/05/2013  . Tremor 05/18/2013  . Irritation of ear 05/18/2013  . Medicare annual wellness visit, subsequent 05/02/2012  . Neck pain 05/02/2012  . Osteoporosis 03/08/2011  . Neck mass 03/08/2011  . Breast cancer ([place]) 11/19/2010  . GERD (gastroesophageal reflux disease) 08/01/2008  . DYSPHAGIA UNSPECIFIED  08/01/2008  . ORTHOSTATIC HYPOTENSION 06/28/2008  . Goiter 07/10/2007  . HLD (hyperlipidemia) 07/10/2007  . MONOCLONAL GAMMOPATHY 07/10/2007  . ANEMIA-NOS 07/10/2007  . FATIGUE 07/10/2007    [facility] 06/16/2015, 8:13 AM  [facility] [address] [place], [name], [address] Phone: [phone]   Fax:  [phone]  Name: [name] MRN: [id number] Date of Birth: 1940-09-03  Vianne Bulls, OTR/L [facility] [address] [place], [place]  [address] [phone] phone [phone] 06/16/2015 8:13 AM

## 2015-06-17 ENCOUNTER — Encounter: Payer: Medicare Other | Admitting: Physical Therapy

## 2015-06-17 NOTE — Patient Instructions (Addendum)
Balance: Eyes Closed - Bilateral (Varied Surfaces)    Stand, feet shoulder width, close eyes. Maintain balance _45___ seconds. Repeat _1__ times per set. Do __1-2__ sets per session. Do _5___ sessions per week. Repeat on compliant surface: foam.  With Eyes Open - look at targets on either side - and then up/down With eyes closed - turn head side to side and then up/down to stimulate vestibular system   Pt also instructed to perform with feet together - EO - look at targets side to side and up/down Copyright  VHI. All rights reserved.
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
## 2015-06-19 ENCOUNTER — Ambulatory Visit: Payer: Medicare Other | Admitting: Physical Therapy

## 2015-06-19 ENCOUNTER — Encounter: Payer: Medicare Other | Admitting: Occupational Therapy

## 2015-06-19 ENCOUNTER — Encounter: Payer: Self-pay | Admitting: Physical Therapy

## 2015-06-19 DIAGNOSIS — R2689 Other abnormalities of gait and mobility: Secondary | ICD-10-CM

## 2015-06-19 DIAGNOSIS — R29818 Other symptoms and signs involving the nervous system: Secondary | ICD-10-CM | POA: Diagnosis not present

## 2015-06-19 DIAGNOSIS — M25611 Stiffness of right shoulder, not elsewhere classified: Secondary | ICD-10-CM | POA: Diagnosis not present

## 2015-06-19 DIAGNOSIS — M25511 Pain in right shoulder: Secondary | ICD-10-CM | POA: Diagnosis not present

## 2015-06-19 DIAGNOSIS — R2681 Unsteadiness on feet: Secondary | ICD-10-CM | POA: Diagnosis not present

## 2015-06-19 DIAGNOSIS — R29898 Other symptoms and signs involving the musculoskeletal system: Secondary | ICD-10-CM

## 2015-06-19 DIAGNOSIS — R293 Abnormal posture: Secondary | ICD-10-CM | POA: Diagnosis not present

## 2015-06-19 DIAGNOSIS — R42 Dizziness and giddiness: Secondary | ICD-10-CM

## 2015-06-19 NOTE — Patient Instructions (Addendum)
Balance: Eyes Closed - Bilateral (Varied Surfaces)    Stand, feet shoulder width, close eyes. Maintain balance _45___ seconds. Repeat _1__ times per set. Do __1-2__ sets per session. Do _5___ sessions per week. Repeat on compliant surface: foam.  With Eyes Open - look at targets on either side - and then up/down With eyes closed - turn head side to side and then up/down to stimulate vestibular system   Pt also instructed to perform with feet together - EO - look at targets side to side and up/down Copyright  VHI. All rights reserved.   06/19/15 Progress with more narrow Base Of Support: Feet together or  Partial tandem stance
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
## 2015-06-21 ENCOUNTER — Other Ambulatory Visit: Payer: Self-pay | Admitting: Neurology

## 2015-06-21 ENCOUNTER — Other Ambulatory Visit: Payer: Self-pay | Admitting: Internal Medicine

## 2015-06-24 ENCOUNTER — Ambulatory Visit: Payer: Medicare Other | Admitting: Occupational Therapy

## 2015-06-24 ENCOUNTER — Ambulatory Visit: Payer: Medicare Other | Admitting: Physical Therapy

## 2015-06-24 DIAGNOSIS — M25611 Stiffness of right shoulder, not elsewhere classified: Secondary | ICD-10-CM | POA: Diagnosis not present

## 2015-06-24 DIAGNOSIS — R29818 Other symptoms and signs involving the nervous system: Secondary | ICD-10-CM | POA: Diagnosis not present

## 2015-06-24 DIAGNOSIS — R2681 Unsteadiness on feet: Secondary | ICD-10-CM

## 2015-06-24 DIAGNOSIS — R2689 Other abnormalities of gait and mobility: Secondary | ICD-10-CM

## 2015-06-24 DIAGNOSIS — R293 Abnormal posture: Secondary | ICD-10-CM

## 2015-06-24 DIAGNOSIS — R29898 Other symptoms and signs involving the musculoskeletal system: Secondary | ICD-10-CM | POA: Diagnosis not present

## 2015-06-24 DIAGNOSIS — M25511 Pain in right shoulder: Secondary | ICD-10-CM
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
## 2015-06-24 NOTE — Telephone Encounter (Signed)
Effexor refill requested. Per last office note- patient to remain on medication. Refill approved and sent to patient's pharmacy.

## 2015-06-24 NOTE — Therapy (Signed)
[facility] [address] [place], [name], [address] Phone: [phone]   Fax:  [phone]  Occupational Therapy Treatment  Patient Details  Name: [name] MRN: [id number] Date of Birth: January 02, 1941 Referring Provider: Dr. Wells Guiles Tat  Encounter Date: 06/24/2015      OT End of Session - 06/24/15 0919    Visit Number 20   Number of Visits 24   Date for OT Re-Evaluation 07/08/15   Authorization Type Medicare, BCBS (g-code needed)   Authorization Time Period re-cert 01/26/85-08/31/74*; renewal 08/13/92 with cert. through 08/05/15   Authorization - Visit Number 38   Authorization - Number of Visits 30   OT Start Time 0847   OT Stop Time 0929   OT Time Calculation (min) 42 min      Past Medical History  Diagnosis Date  . Esophageal Stricture   . Monoclonal gammopathy   . Anemia     Anemia-NOS / PMH, Dr Julien Nordmann  . Hyperlipidemia   . Vertical diplopia   . MVP (mitral valve prolapse)   . Breast cancer (HCC)     stage I right breast  . Skin cancer     basal cell L neck  . Allergy     SEASONAL  . Arthritis   . Cataract     BILATERAL  . Osteoporosis   . Decreased vision     R eye    Past Surgical History  Procedure Laterality Date  . Total abdominal hysterectomy w/ bilateral salpingoophorectomy  75 yrs old    for pain,prolapse ; G3 P2  . Salpingoophorectomy  2002    for benign growths  . Hemorrhoid surgery  1970's  . Appendectomy  2002  . Cardiac catheterization  2005    neg  . Colonoscopy      negative   . Bone biopsy  11/06/10  . Breast lumpectomy  02/2010    right breast lumpectomy and sentinel node biopsy  . Upper gastrointestinal endoscopy      There were no vitals filed for this visit.      Subjective Assessment - 06/24/15 0913    Subjective  Pt reports she is going to visit her daughter tonight   Pertinent History hx of R breast CA with lumpectomy and lymph node removal and radiation  2012; multiple myeloma, osteoporesis   Patient Stated Goals improve R shoulder pain   Currently in Pain? No/denies          Neuro re-ed:  PWR! Moves (up, rock, twist) in supine x 10-20 each with min cues intermittently For incr movement amplitude (hands)  In supine, shoulder flex and chest press with 2lb weighted ball.  Then diagonals to each side with 2lb wt. Ball.    In sitting, shoulder flex and diagonals to each side x10 each (shoulder flex to 90*) with 1lb weighted ball.  Functional reaching overhead with RUE incorporating trunk rotation (reaching in ER, across body, and overhead) to place small pegs in vertical pegboard to copy small peg design for cognitive component., min v.c.                         OT Short Term Goals - 05/05/15 1521    OT SHORT TERM GOAL #1   Title Pt will be independent with initial HEP--check STGs 05/08/15   Time 4   Period Weeks   Status Achieved  04/28/15    OT SHORT TERM GOAL #2  Title Pt will demo at least 105* R shoulder flex with 3/10 pain or less.   Baseline 90* with 6/10 pain   Time 4   Period Weeks   Status Achieved  05/05/15:  125* with 2/10 pain   OT SHORT TERM GOAL #3   Title Pt will report RUE pain less than or equal to 5/10 for ADLs.   Time 4   Period Weeks   Status Achieved  0-4/10   OT SHORT TERM GOAL #4   Title Pt will demo at least 85* R shoulder abduction for ADLs.   Baseline 70*   Time 4   Period Weeks   Status Achieved  05/05/15:  90* with 4/10 pain           OT Long Term Goals - 06/05/15 0911    OT LONG TERM GOAL #1   Title Pt will verbalize understanding of updated strategies to increase ease, safety, and independence and decr pain with ADLs/IADLs prn.--check LTGs 06/07/15   Time 8   Period Weeks   Status Achieved  06/05/15:  met   OT LONG TERM GOAL #2   Title Pt will improve LUE functional reaching/coordination as shown by improving time on box and blocks test by at least 4.   Baseline  R-51 blocks, L-51 blocks   Status On-going   OT LONG TERM GOAL #3   Title Pt will improve balance for ADLs/IADLs as shown by improving standing functional reach test by 2 inches bilaterally.   Baseline 8 inches bilaterally   Time 8   Period Weeks   Status Achieved  06/05/15:  R-10", L-11"   OT LONG TERM GOAL #4   Title Pt will improve RUE functional reaching/coordination as shown by improving time on box and blocks test by at least 5.   Baseline R-51 blocks, L-51 blocks   Time 8   Period Weeks   Status Achieved  06/05/15: 56 blocks   OT LONG TERM GOAL #5   Title Pt will demo at least 135* R shoulder flex with 3/10 pain or less.   Baseline 90* with 6/10 pain   Time 8   Period Weeks   Status On-going  05/05/15 upgraded goal as pt met original goal;  06/02/15  132* with 4/10 pain   Long Term Additional Goals   Additional Long Term Goals Yes   OT LONG TERM GOAL #6   Title Pt will demo at least 100* R shoulder abduction for ADLs.   Baseline 70* with pain   Time 8   Period Weeks   Status Achieved  06/02/15:  120* with 4/10 pain   OT LONG TERM GOAL #7   Title Pt will be independent with updated HEP.--.--check updated LTGs 07/06/15   Time 4   Period Weeks   Status New   OT LONG TERM GOAL #8   Title Pt will reports ability to don bra and pull-over shirt with 1/10 or less pain..--check updated LTGs 07/06/15   Time 4   Period Weeks   Status New   OT LONG TERM GOAL  #9   Baseline Pt will be able to retrieve 2lb object from overhead shelf with 2/10 or less pain.--check updated LTGs 07/06/15   Time 4   Period Weeks   Status New   OT LONG TERM GOAL  #10   TITLE Pt will be able to wt. bear through RUE in quadraped in order to resume previous PD HEP with 2/10 pain or less.  Time 4   Period Weeks   Status New               Plan - July 04, 2015 0914    Clinical Impression Statement Pt is progressing towards goals with decreased overall pain and improved shoulder A/ROM. Pt can benefit  from continued skilled occupational therapy to address the following deficits:bradykinesia, right shoulder pain, rigidity, decreased balance and coordiantion in order to maximiize safety and independence with ADLS/IADLS and to maintain quality of life.   Rehab Potential Good   Clinical Impairments Affecting Rehab Potential R shoulder pain   OT Frequency 2x / week   OT Duration 4 weeks   OT Treatment/Interventions Moist Heat;Passive range of motion;Electrical Stimulation;Therapeutic activities;DME and/or AE instruction;Cryotherapy;Therapeutic exercises;Neuromuscular education;Ultrasound;Self-care/ADL training;Therapeutic exercise;Functional Mobility Training;Patient/family education;Manual Therapy   Plan continue to progress HEP, and functional use of RUE with ADL   OT Home Exercise Plan education provided:  initial HEP; 04/28/15 cane ex for shoulder abduction, ER, issued PWR! up, twist and rock in supine; theraband HEP (yellow) 06/09/15   Consulted and Agree with Plan of Care Patient      Patient will benefit from skilled therapeutic intervention in order to improve the following deficits and impairments:  Pain, Impaired UE functional use, Decreased knowledge of use of DME, Decreased activity tolerance, Impaired tone, Decreased mobility, Decreased strength, Decreased coordination, Decreased range of motion, Improper body mechanics  Visit Diagnosis: Other symptoms and signs involving the nervous system  Other symptoms and signs involving the musculoskeletal system  Unsteadiness on feet  Pain in right shoulder  Abnormal posture      G-Codes - 2015-07-04 0920    Functional Assessment Tool Used R shoulder flexion 125*, no reports of pain , shoulder abduction 110*, continued difficulty doffing a pullover shirt   Functional Limitation Carrying, moving and handling objects   Carrying, Moving and Handling Objects Current Status [id number]) At least 1 percent but less than 20 percent impaired, limited or  restricted   Carrying, Moving and Handling Objects Goal Status (Y0459) At least 1 percent but less than 20 percent impaired, limited or restricted     Occupational Therapy Progress Note  Dates of Reporting Period: 04/08/15 to 07-04-2015  Objective Reports of Subjective Statement: Pt reports decreased overall right shoulder pain  Objective Measurements: see g-code  Goal Update: Pt is progressing towards remaining long term goals with decreased overall pain and improved A/ROM  Plan: Continue to work towards unmet long term goals.  Reason Skilled Services are Required: see above Problem List Patient Active Problem List   Diagnosis Date Noted  . Headache 05/11/2015  . Abnormal glucose 04/28/2015  . Globus sensation 04/24/2015  . Foot pain 02/25/2015  . Skin tear of forearm without complication 97/74/1423  . Skin lesion 12/10/2014  . Muscle pain 07/20/2014  . Shoulder pain 04/17/2014  . PD (Parkinson's disease) ([place]) 06/05/2013  . Tremor 05/18/2013  . Irritation of ear 05/18/2013  . Medicare annual wellness visit, subsequent 05/02/2012  . Neck pain 05/02/2012  . Osteoporosis 03/08/2011  . Neck mass 03/08/2011  . Breast cancer ([place]) 11/19/2010  . GERD (gastroesophageal reflux disease) 08/01/2008  . DYSPHAGIA UNSPECIFIED 08/01/2008  . ORTHOSTATIC HYPOTENSION 06/28/2008  . Goiter 07/10/2007  . HLD (hyperlipidemia) 07/10/2007  . MONOCLONAL GAMMOPATHY 07/10/2007  . ANEMIA-NOS 07/10/2007  . FATIGUE 07/10/2007    Lavelle Berland Jul 04, 2015, 11:13 AM Theone Murdoch, OTR/L [phone] Phone: [phone] 11:13 AM 07-04-15 [facility] [address] [place], [name],  [address] Phone: [phone]   Fax:  [phone]  Name: GRICEL COPEN MRN: [id number] Date of Birth: 03/26/1940

## 2015-06-26 ENCOUNTER — Encounter: Payer: Medicare Other | Admitting: Physical Therapy

## 2015-06-27 ENCOUNTER — Ambulatory Visit: Payer: Medicare Other | Attending: Neurology | Admitting: Occupational Therapy

## 2015-06-27 DIAGNOSIS — R29898 Other symptoms and signs involving the musculoskeletal system: Secondary | ICD-10-CM

## 2015-06-27 DIAGNOSIS — R293 Abnormal posture: Secondary | ICD-10-CM | POA: Diagnosis not present

## 2015-06-27 DIAGNOSIS — R2689 Other abnormalities of gait and mobility: Secondary | ICD-10-CM | POA: Insufficient documentation

## 2015-06-27 DIAGNOSIS — M25511 Pain in right shoulder: Secondary | ICD-10-CM | POA: Diagnosis not present

## 2015-06-27 DIAGNOSIS — R29818 Other symptoms and signs involving the nervous system: Secondary | ICD-10-CM | POA: Insufficient documentation

## 2015-06-27 DIAGNOSIS — R2681 Unsteadiness on feet: Secondary | ICD-10-CM | POA: Diagnosis not present

## 2015-06-27 NOTE — Therapy (Signed)
[facility] [address] [place], [name], [address] Phone: [phone]   Fax:  [phone]  Occupational Therapy Treatment  Patient Details  Name: [name] MRN: [id number] Date of Birth: 10-05-1940 Referring Provider: Dr. Wells Guiles Tat  Encounter Date: 06/27/2015      OT End of Session - 06/27/15 1025    Visit Number 21   Number of Visits 24   Date for OT Re-Evaluation 07/08/15   Authorization Type Medicare, BCBS (g-code needed)   Authorization Time Period re-cert 03/02/18-03/29/57*; renewal 7/41/63 with cert. through 08/05/15   Authorization - Visit Number 21   Authorization - Number of Visits 30   OT Start Time 1020   OT Stop Time 1100   OT Time Calculation (min) 40 min   Activity Tolerance Patient tolerated treatment well   Behavior During Therapy WFL for tasks assessed/performed      Past Medical History  Diagnosis Date  . Esophageal Stricture   . Monoclonal gammopathy   . Anemia     Anemia-NOS / PMH, Dr Julien Nordmann  . Hyperlipidemia   . Vertical diplopia   . MVP (mitral valve prolapse)   . Breast cancer (HCC)     stage I right breast  . Skin cancer     basal cell L neck  . Allergy     SEASONAL  . Arthritis   . Cataract     BILATERAL  . Osteoporosis   . Decreased vision     R eye    Past Surgical History  Procedure Laterality Date  . Total abdominal hysterectomy w/ bilateral salpingoophorectomy  75 yrs old    for pain,prolapse ; G3 P2  . Salpingoophorectomy  2002    for benign growths  . Hemorrhoid surgery  1970's  . Appendectomy  2002  . Cardiac catheterization  2005    neg  . Colonoscopy      negative   . Bone biopsy  11/06/10  . Breast lumpectomy  02/2010    right breast lumpectomy and sentinel node biopsy  . Upper gastrointestinal endoscopy      There were no vitals filed for this visit.      Subjective Assessment - 06/27/15 1048    Pertinent History hx of R breast CA with  lumpectomy and lymph node removal and radiation 2012; multiple myeloma, osteoporesis   Patient Stated Goals improve R shoulder pain   Currently in Pain? Yes   Pain Score 1    Pain Location Shoulder   Pain Orientation Right   Pain Descriptors / Indicators Aching   Pain Type Chronic pain   Pain Onset More than a month ago   Pain Frequency Intermittent   Aggravating Factors  malpositioning, reaching   Pain Relieving Factors rest            Functional overhead reaching with LUE to copy small peg design with large amplitude movements, min v.c. Seated shoulder flexion then diagonals with 2 lbs ball x 10 reps for increased strength and endurance.            PWR [facility]) - 06/27/15 1054    PWR! exercises Moves in supine;Moves in [place]! Up x10   PWR! Rock x10   PWR! Twist x10   PWR! Step x10   Comments min v.c., only minimal discomfort   PWR! [employer]   PWR! Twist x16               OT Short  Term Goals - 05/05/15 1521    OT SHORT TERM GOAL #1   Title Pt will be independent with initial HEP--check STGs 05/08/15   Time 4   Period Weeks   Status Achieved  04/28/15    OT SHORT TERM GOAL #2   Title Pt will demo at least 105* R shoulder flex with 3/10 pain or less.   Baseline 90* with 6/10 pain   Time 4   Period Weeks   Status Achieved  05/05/15:  125* with 2/10 pain   OT SHORT TERM GOAL #3   Title Pt will report RUE pain less than or equal to 5/10 for ADLs.   Time 4   Period Weeks   Status Achieved  0-4/10   OT SHORT TERM GOAL #4   Title Pt will demo at least 85* R shoulder abduction for ADLs.   Baseline 70*   Time 4   Period Weeks   Status Achieved  05/05/15:  90* with 4/10 pain           OT Long Term Goals - 06/27/15 1040    OT LONG TERM GOAL #1   Title Pt will verbalize understanding of updated strategies to increase ease, safety, and independence and decr pain with ADLs/IADLs prn.--check LTGs 06/07/15   Time 8   Period Weeks   Status  Achieved  06/05/15:  met   OT LONG TERM GOAL #2   Title Pt will improve LUE functional reaching/coordination as shown by improving time on box and blocks test by at least 4.   Baseline R-51 blocks, L-51 blocks   Status On-going   OT LONG TERM GOAL #3   Title Pt will improve balance for ADLs/IADLs as shown by improving standing functional reach test by 2 inches bilaterally.   Baseline 8 inches bilaterally   Time 8   Period Weeks   Status Achieved  06/05/15:  R-10", L-11"   OT LONG TERM GOAL #4   Title Pt will improve RUE functional reaching/coordination as shown by improving time on box and blocks test by at least 5.   Baseline R-51 blocks, L-51 blocks   Time 8   Period Weeks   Status Achieved  06/05/15: 56 blocks   OT LONG TERM GOAL #5   Title Pt will demo at least 135* R shoulder flex with 3/10 pain or less.   Baseline 90* with 6/10 pain   Time 8   Period Weeks   Status On-going  05/05/15 upgraded goal as pt met original goal;  06/02/15  132* with 4/10 pain   OT LONG TERM GOAL #6   Title Pt will demo at least 100* R shoulder abduction for ADLs.   Baseline 70* with pain   Time 8   Period Weeks   Status Achieved  06/02/15:  120* with 4/10 pain   OT LONG TERM GOAL #7   Title Pt will be independent with updated HEP.--.--check updated LTGs 07/06/15   Time 4   Period Weeks   Status On-going   OT LONG TERM GOAL #8   Title Pt will reports ability to don bra and pull-over shirt with 1/10 or less pain..--check updated LTGs 07/06/15   Time 4   Period Weeks   Status Achieved   OT LONG TERM GOAL  #9   Baseline Pt will be able to retrieve 2lb object from overhead shelf with 2/10 or less pain.--check updated LTGs 07/06/15   Time 4   Period Weeks   Status  On-going   OT LONG TERM GOAL  #10   TITLE Pt will be able to wt. bear through RUE in quadraped in order to resume previous PD HEP with 2/10 pain or less.   Time 4   Period Weeks   Status Achieved               Plan - 06/27/15  1050    Clinical Impression Statement Pt demonstrates excellent progress towards long term goals. Pt reports only minimal pain today with exercise.   Rehab Potential Good   OT Frequency 2x / week   OT Duration 4 weeks   OT Treatment/Interventions Moist Heat;Passive range of motion;Electrical Stimulation;Therapeutic activities;DME and/or AE instruction;Cryotherapy;Therapeutic exercises;Neuromuscular education;Ultrasound;Self-care/ADL training;Therapeutic exercise;Functional Mobility Training;Patient/family education;Manual Therapy   Plan check goals next week, anticipate d/c   OT Home Exercise Plan education provided:  initial HEP; 04/28/15 cane ex for shoulder abduction, ER, issued PWR! up, twist and rock in supine; theraband HEP (yellow) 06/09/15   Consulted and Agree with Plan of Care Patient      Patient will benefit from skilled therapeutic intervention in order to improve the following deficits and impairments:  Pain, Impaired UE functional use, Decreased knowledge of use of DME, Decreased activity tolerance, Impaired tone, Decreased mobility, Decreased strength, Decreased coordination, Decreased range of motion, Improper body mechanics  Visit Diagnosis: Other symptoms and signs involving the nervous system  Other symptoms and signs involving the musculoskeletal system  Pain in right shoulder  Abnormal posture    Problem List Patient Active Problem List   Diagnosis Date Noted  . Headache 05/11/2015  . Abnormal glucose 04/28/2015  . Globus sensation 04/24/2015  . Foot pain 02/25/2015  . Skin tear of forearm without complication 25/00/3704  . Skin lesion 12/10/2014  . Muscle pain 07/20/2014  . Shoulder pain 04/17/2014  . PD (Parkinson's disease) ([place]) 06/05/2013  . Tremor 05/18/2013  . Irritation of ear 05/18/2013  . Medicare annual wellness visit, subsequent 05/02/2012  . Neck pain 05/02/2012  . Osteoporosis 03/08/2011  . Neck mass 03/08/2011  . Breast cancer ([facility])  11/19/2010  . GERD (gastroesophageal reflux disease) 08/01/2008  . DYSPHAGIA UNSPECIFIED 08/01/2008  . ORTHOSTATIC HYPOTENSION 06/28/2008  . Goiter 07/10/2007  . HLD (hyperlipidemia) 07/10/2007  . MONOCLONAL GAMMOPATHY 07/10/2007  . ANEMIA-NOS 07/10/2007  . FATIGUE 07/10/2007    Lorita Forinash 06/27/2015, 1:03 PM Theone Murdoch, OTR/L [phone] Phone: [phone] 1:03 PM 06/27/2015  [facility] [address] [place], [name], [address] Phone: [phone]   Fax:  [phone]  Name: [name] MRN: [id number] Date of Birth: [birth year]

## 2015-07-01 ENCOUNTER — Ambulatory Visit: Payer: Medicare Other | Admitting: Occupational Therapy

## 2015-07-01 ENCOUNTER — Encounter: Payer: Self-pay | Admitting: Physical Therapy

## 2015-07-01 ENCOUNTER — Ambulatory Visit: Payer: Medicare Other | Admitting: Physical Therapy

## 2015-07-01 DIAGNOSIS — R29898 Other symptoms and signs involving the musculoskeletal system: Secondary | ICD-10-CM

## 2015-07-01 DIAGNOSIS — M25511 Pain in right shoulder: Secondary | ICD-10-CM

## 2015-07-01 DIAGNOSIS — R2681 Unsteadiness on feet: Secondary | ICD-10-CM

## 2015-07-01 DIAGNOSIS — R2689 Other abnormalities of gait and mobility: Secondary | ICD-10-CM | POA: Diagnosis not present

## 2015-07-01 DIAGNOSIS — R293 Abnormal posture: Secondary | ICD-10-CM | POA: Diagnosis not present

## 2015-07-01 DIAGNOSIS — R29818 Other symptoms and signs involving the nervous system: Secondary | ICD-10-CM | POA: Diagnosis not present

## 2015-07-01 NOTE — Therapy (Signed)
[facility] [address] [name], [name], [address] Phone: [phone]   Fax:  [phone]  Occupational Therapy Treatment  Patient Details  Name: [name] MRN: [id number] Date of Birth: 07-16-1940 Referring Provider: Dr. Wells Guiles Tat  Encounter Date: 07/01/2015      OT End of Session - 07/01/15 1200    Visit Number 22   Number of Visits 24   Date for OT Re-Evaluation 07/08/15   Authorization Type Medicare, BCBS (g-code needed)   Authorization Time Period re-cert 03/29/68-01/31/77*; renewal 3/90/30 with cert. through 08/05/15   Authorization - Visit Number 28   Authorization - Number of Visits 30   OT Start Time 0923   OT Stop Time 1231   OT Time Calculation (min) 38 min   Activity Tolerance Patient tolerated treatment well   Behavior During Therapy WFL for tasks assessed/performed      Past Medical History  Diagnosis Date  . Esophageal Stricture   . Monoclonal gammopathy   . Anemia     Anemia-NOS / PMH, Dr Julien Nordmann  . Hyperlipidemia   . Vertical diplopia   . MVP (mitral valve prolapse)   . Breast cancer (HCC)     stage I right breast  . Skin cancer     basal cell L neck  . Allergy     SEASONAL  . Arthritis   . Cataract     BILATERAL  . Osteoporosis   . Decreased vision     R eye    Past Surgical History  Procedure Laterality Date  . Total abdominal hysterectomy w/ bilateral salpingoophorectomy  75 yrs old    for pain,prolapse ; G3 P2  . Salpingoophorectomy  2002    for benign growths  . Hemorrhoid surgery  1970's  . Appendectomy  2002  . Cardiac catheterization  2005    neg  . Colonoscopy      negative   . Bone biopsy  11/06/10  . Breast lumpectomy  02/2010    right breast lumpectomy and sentinel node biopsy  . Upper gastrointestinal endoscopy      There were no vitals filed for this visit.      Subjective Assessment - 07/01/15 1159    Subjective  Denies pain   Pertinent  History hx of R breast CA with lumpectomy and lymph node removal and radiation 2012; multiple myeloma, osteoporesis   Patient Stated Goals improve R shoulder pain   Currently in Pain? No/denies                   Supine shoulder flexion with ball, followed by chest press with 2 lbs ball x 10-15 reps, Seated diagonals with 2 lbs ball 10 reps each direction, min v.c. Therapist  started checking progress towards goals Grooved pegboard with bilateral UE's for increased fine motor coordination, removing pegs with PWR! Step, min v.c. Tossing scarves to targets with bilateral UE's with functional step and reach, min v.c for larger amplitude movements.      P[facility]) - 07/01/15 1209    PWR! exercises Moves in [place]! Up x10   PWR! Rock x10   PWR! Twist x20   PWR! Step x10   Comments min v.c., only mild discomfort with twist               OT Short Term Goals - 05/05/15 1521    OT SHORT TERM GOAL #1   Title Pt will be independent with initial  HEP--check STGs 05/08/15   Time 4   Period Weeks   Status Achieved  04/28/15    OT SHORT TERM GOAL #2   Title Pt will demo at least 105* R shoulder flex with 3/10 pain or less.   Baseline 90* with 6/10 pain   Time 4   Period Weeks   Status Achieved  05/05/15:  125* with 2/10 pain   OT SHORT TERM GOAL #3   Title Pt will report RUE pain less than or equal to 5/10 for ADLs.   Time 4   Period Weeks   Status Achieved  0-4/10   OT SHORT TERM GOAL #4   Title Pt will demo at least 85* R shoulder abduction for ADLs.   Baseline 70*   Time 4   Period Weeks   Status Achieved  05/05/15:  90* with 4/10 pain           OT Long Term Goals - 07/01/15 1217    OT LONG TERM GOAL #1   Title Pt will verbalize understanding of updated strategies to increase ease, safety, and independence and decr pain with ADLs/IADLs prn.--check LTGs 06/07/15   Time 8   Period Weeks   Status Achieved  06/05/15:  met   OT LONG TERM GOAL #2    Title Pt will improve LUE functional reaching/coordination as shown by improving time on box and blocks test by at least 4.   Baseline R-51 blocks, L-51 blocks   Status Not Met  49 blocks, 54 blocks, not mtet but improved on second trial.   OT LONG TERM GOAL #3   Title Pt will improve balance for ADLs/IADLs as shown by improving standing functional reach test by 2 inches bilaterally.   Baseline 8 inches bilaterally   Time 8   Period Weeks   Status Achieved  06/05/15:  R-10", L-11"   OT LONG TERM GOAL #4   Title Pt will improve RUE functional reaching/coordination as shown by improving time on box and blocks test by at least 5.   Baseline R-51 blocks, L-51 blocks   Time 8   Period Weeks   Status Achieved  06/05/15: 56 blocks   OT LONG TERM GOAL #5   Title Pt will demo at least 135* R shoulder flex with 3/10 pain or less.   Baseline 90* with 6/10 pain   Time 8   Period Weeks   Status On-going  05/05/15 upgraded goal as pt met original goal;  06/02/15  132* with 4/10 pain   OT LONG TERM GOAL #6   Title Pt will demo at least 100* R shoulder abduction for ADLs.   Baseline 70* with pain   Time 8   Period Weeks   Status Achieved  06/02/15:  120* with 4/10 pain   OT LONG TERM GOAL #7   Title Pt will be independent with updated HEP.--.--check updated LTGs 07/06/15   Time 4   Period Weeks   Status On-going   OT LONG TERM GOAL #8   Title Pt will reports ability to don bra and pull-over shirt with 1/10 or less pain..--check updated LTGs 07/06/15   Time 4   Period Weeks   Status Achieved   OT LONG TERM GOAL  #9   Baseline Pt will be able to retrieve 2lb object from overhead shelf with 2/10 or less pain.--check updated LTGs 07/06/15   Time 4   Period Weeks   Status On-going   OT LONG TERM GOAL  #10  TITLE Pt will be able to wt. bear through RUE in quadraped in order to resume previous PD HEP with 2/10 pain or less.   Time 4   Period Weeks   Status Achieved               Plan -  07/01/15 1159    Clinical Impression Statement Pt demonstraes excellent overall progress, and no reports of pain today.   Rehab Potential Good   Clinical Impairments Affecting Rehab Potential R shoulder pain   OT Frequency 2x / week   OT Duration 4 weeks   OT Treatment/Interventions Moist Heat;Passive range of motion;Electrical Stimulation;Therapeutic activities;DME and/or AE instruction;Cryotherapy;Therapeutic exercises;Neuromuscular education;Ultrasound;Self-care/ADL training;Therapeutic exercise;Functional Mobility Training;Patient/family education;Manual Therapy   Plan check goals and discharge next visit   OT Home Exercise Plan education provided:  initial HEP; 04/28/15 cane ex for shoulder abduction, ER, issued PWR! up, twist and rock in supine; theraband HEP (yellow) 06/09/15   Consulted and Agree with Plan of Care Patient      Patient will benefit from skilled therapeutic intervention in order to improve the following deficits and impairments:  Pain, Impaired UE functional use, Decreased knowledge of use of DME, Decreased activity tolerance, Impaired tone, Decreased mobility, Decreased strength, Decreased coordination, Decreased range of motion, Improper body mechanics  Visit Diagnosis: Other symptoms and signs involving the nervous system  Other symptoms and signs involving the musculoskeletal system  Pain in right shoulder  Abnormal posture  Unsteadiness on feet  Other abnormalities of gait and mobility    Problem List Patient Active Problem List   Diagnosis Date Noted  . Headache 05/11/2015  . Abnormal glucose 04/28/2015  . Globus sensation 04/24/2015  . Foot pain 02/25/2015  . Skin tear of forearm without complication 54/00/8676  . Skin lesion 12/10/2014  . Muscle pain 07/20/2014  . Shoulder pain 04/17/2014  . PD (Parkinson's disease) ([place]) 06/05/2013  . Tremor 05/18/2013  . Irritation of ear 05/18/2013  . Medicare annual wellness visit, subsequent 05/02/2012  .  Neck pain 05/02/2012  . Osteoporosis 03/08/2011  . Neck mass 03/08/2011  . Breast cancer ([place]) 11/19/2010  . GERD (gastroesophageal reflux disease) 08/01/2008  . DYSPHAGIA UNSPECIFIED 08/01/2008  . ORTHOSTATIC HYPOTENSION 06/28/2008  . Goiter 07/10/2007  . HLD (hyperlipidemia) 07/10/2007  . MONOCLONAL GAMMOPATHY 07/10/2007  . ANEMIA-NOS 07/10/2007  . FATIGUE 07/10/2007    RINE,KATHRYN 07/01/2015, 12:27 PM Theone Murdoch, OTR/L [phone] Phone: [phone] 12:27 PM 06/06/2017[facility] [address] [place], [name], [address] Phone: [phone]   Fax:  [phone]  Name: [name] MRN: [id number] Date of Birth: [birth year]
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
## 2015-07-03 ENCOUNTER — Ambulatory Visit: Payer: Medicare Other | Admitting: Physical Therapy

## 2015-07-03 ENCOUNTER — Ambulatory Visit: Payer: Medicare Other | Admitting: Occupational Therapy

## 2015-07-03 DIAGNOSIS — R2681 Unsteadiness on feet: Secondary | ICD-10-CM | POA: Diagnosis not present

## 2015-07-03 DIAGNOSIS — R293 Abnormal posture: Secondary | ICD-10-CM | POA: Diagnosis not present

## 2015-07-03 DIAGNOSIS — R29898 Other symptoms and signs involving the musculoskeletal system: Secondary | ICD-10-CM

## 2015-07-03 DIAGNOSIS — R29818 Other symptoms and signs involving the nervous system: Secondary | ICD-10-CM

## 2015-07-03 DIAGNOSIS — M25511 Pain in right shoulder: Secondary | ICD-10-CM | POA: Diagnosis not present

## 2015-07-03 DIAGNOSIS — R2689 Other abnormalities of gait and mobility: Secondary | ICD-10-CM | POA: Diagnosis not present

## 2015-07-03 NOTE — Therapy (Signed)
[facility] [address] [place], [name], [address] Phone: [phone]   Fax:  [phone]  Occupational Therapy Treatment  Patient Details  Name: [name] MRN: [id number] Date of Birth: February 15, 1940 Referring Provider: Dr. Wells Guiles Tat  Encounter Date: 07/03/2015      OT End of Session - 07/03/15 1502    Visit Number 23   Number of Visits 24   Date for OT Re-Evaluation 07/08/15   Authorization Type Medicare, BCBS (g-code needed)   Authorization Time Period re-cert 09/01/09-0/3/15*; renewal 9/45/85 with cert. through 08/05/15   Authorization - Visit Number 23   Authorization - Number of Visits 30   OT Start Time 0850   OT Stop Time 0930   OT Time Calculation (min) 40 min   Activity Tolerance Patient tolerated treatment well   Behavior During Therapy [facility] for tasks assessed/performed      Past Medical History  Diagnosis Date  . Esophageal Stricture   . Monoclonal gammopathy   . Anemia     Anemia-NOS / PMH, Dr Julien Nordmann  . Hyperlipidemia   . Vertical diplopia   . MVP (mitral valve prolapse)   . Breast cancer (HCC)     stage I right breast  . Skin cancer     basal cell L neck  . Allergy     SEASONAL  . Arthritis   . Cataract     BILATERAL  . Osteoporosis   . Decreased vision     R eye    Past Surgical History  Procedure Laterality Date  . Total abdominal hysterectomy w/ bilateral salpingoophorectomy  75 yrs old    for pain,prolapse ; G3 P2  . Salpingoophorectomy  2002    for benign growths  . Hemorrhoid surgery  1970's  . Appendectomy  2002  . Cardiac catheterization  2005    neg  . Colonoscopy      negative   . Bone biopsy  11/06/10  . Breast lumpectomy  02/2010    right breast lumpectomy and sentinel node biopsy  . Upper gastrointestinal endoscopy      There were no vitals filed for this visit.       Therapist checked progress towards goals and discussed with pt.. Pt agrees with  plans for discharge.  Pt can benefit from a screen in 6 mons. Arm bike x 5 mins level 1 for conditioning.              PWR [facility]) - 07/03/15 1500    PWR! exercises Moves in prone   PWR! Up x10   PWR! Rock x10   PWR! Twist x10   PWR! Step x10   Comments min v.c. and demonstration               OT Short Term Goals - 05/05/15 1521    OT SHORT TERM GOAL #1   Title Pt will be independent with initial HEP--check STGs 05/08/15   Time 4   Period Weeks   Status Achieved  04/28/15    OT SHORT TERM GOAL #2   Title Pt will demo at least 105* R shoulder flex with 3/10 pain or less.   Baseline 90* with 6/10 pain   Time 4   Period Weeks   Status Achieved  05/05/15:  125* with 2/10 pain   OT SHORT TERM GOAL #3   Title Pt will report RUE pain less than or equal to 5/10 for ADLs.   Time 4  Period Weeks   Status Achieved  0-4/10   OT SHORT TERM GOAL #4   Title Pt will demo at least 85* R shoulder abduction for ADLs.   Baseline 70*   Time 4   Period Weeks   Status Achieved  05/05/15:  90* with 4/10 pain           OT Long Term Goals - 07/03/15 0852    OT LONG TERM GOAL #1   Title Pt will verbalize understanding of updated strategies to increase ease, safety, and independence and decr pain with ADLs/IADLs prn.--check LTGs 06/07/15   Time 8   Period Weeks   Status Achieved  06/05/15:  met   OT LONG TERM GOAL #2   Title Pt will improve LUE functional reaching/coordination as shown by improving time on box and blocks test by at least 4.   Baseline R-51 blocks, L-51 blocks   Status Achieved  met  LUE :57 blocks   OT LONG TERM GOAL #3   Title Pt will improve balance for ADLs/IADLs as shown by improving standing functional reach test by 2 inches bilaterally.   Baseline 8 inches bilaterally   Time 8   Period Weeks   Status Achieved  06/05/15:  R-10", L-11"   OT LONG TERM GOAL #4   Title Pt will improve RUE functional reaching/coordination as shown by improving time on  box and blocks test by at least 5.   Baseline R-51 blocks, L-51 blocks   Time 8   Period Weeks   Status Achieved  06/05/15: 56 blocks   OT LONG TERM GOAL #5   Title Pt will demo at least 135* R shoulder flex with 3/10 pain or less.   Baseline 90* with 6/10 pain   Time 8   Period Weeks   Status Achieved  135* with 2/10   OT LONG TERM GOAL #6   Title Pt will demo at least 100* R shoulder abduction for ADLs.   Baseline 70* with pain   Time 8   Period Weeks   Status Achieved  06/02/15:  120* with 4/10 pain   OT LONG TERM GOAL #7   Title Pt will be independent with updated HEP.--.--check updated LTGs 07/06/15   Time 4   Period Weeks   Status Achieved   OT LONG TERM GOAL #8   Title Pt will reports ability to don bra and pull-over shirt with 1/10 or less pain..--check updated LTGs 07/06/15   Time 4   Period Weeks   Status Achieved   OT LONG TERM GOAL  #9   Baseline Pt will be able to retrieve 2lb object from overhead shelf with 2/10 or less pain.--check updated LTGs 07/06/15   Time 4   Period Weeks   Status Achieved  pain 2/10   OT LONG TERM GOAL  #10   TITLE Pt will be able to wt. bear through RUE in quadraped in order to resume previous PD HEP with 2/10 pain or less.   Time 4   Period Weeks   Status Achieved               Plan - 07/03/15 0907    Clinical Impression Statement Pt demonstrates excellent overall progress and she agrees with plans for d/c.   Rehab Potential Good   Clinical Impairments Affecting Rehab Potential R shoulder pain   OT Frequency 2x / week   OT Duration 4 weeks   OT Treatment/Interventions Moist Heat;Passive range of motion;Electrical Stimulation;Therapeutic activities;DME  and/or AE instruction;Cryotherapy;Therapeutic exercises;Neuromuscular education;Ultrasound;Self-care/ADL training;Therapeutic exercise;Functional Mobility Training;Patient/family education;Manual Therapy   Plan discharge OT , screen in 6 mons   OT Home Exercise Plan education  provided:  initial HEP; 04/28/15 cane ex for shoulder abduction, ER, issued PWR! up, twist and rock in supine; theraband HEP (yellow) 06/09/15   Consulted and Agree with Plan of Care Patient      Patient will benefit from skilled therapeutic intervention in order to improve the following deficits and impairments:  Pain, Impaired UE functional use, Decreased knowledge of use of DME, Decreased activity tolerance, Impaired tone, Decreased mobility, Decreased strength, Decreased coordination, Decreased range of motion, Improper body mechanics  Visit Diagnosis: Other symptoms and signs involving the nervous system  Other abnormalities of gait and mobility  Unsteadiness on feet  Other symptoms and signs involving the musculoskeletal system  Pain in right shoulder  Abnormal posture    Problem List Patient Active Problem List   Diagnosis Date Noted  . Headache 05/11/2015  . Abnormal glucose 04/28/2015  . Globus sensation 04/24/2015  . Foot pain 02/25/2015  . Skin tear of forearm without complication 52/84/1324  . Skin lesion 12/10/2014  . Muscle pain 07/20/2014  . Shoulder pain 04/17/2014  . PD (Parkinson's disease) ([place]) 06/05/2013  . Tremor 05/18/2013  . Irritation of ear 05/18/2013  . Medicare annual wellness visit, subsequent 05/02/2012  . Neck pain 05/02/2012  . Osteoporosis 03/08/2011  . Neck mass 03/08/2011  . Breast cancer ([place]) 11/19/2010  . GERD (gastroesophageal reflux disease) 08/01/2008  . DYSPHAGIA UNSPECIFIED 08/01/2008  . ORTHOSTATIC HYPOTENSION 06/28/2008  . Goiter 07/10/2007  . HLD (hyperlipidemia) 07/10/2007  . MONOCLONAL GAMMOPATHY 07/10/2007  . ANEMIA-NOS 07/10/2007  . FATIGUE 07/10/2007  OCCUPATIONAL THERAPY DISCHARGE SUMMARY    Current functional level related to goals / functional outcomes: Pt made excellent overall progress and achieved all long term goals.   Remaining deficits: Rigidity, decreased coordination, decreased balance, bradykinesia,  abnormal posture   Education / Equipment: Pt was educated regarding HEP, and updated strategies for ADLs/ IADLs . Pt verbalized understanding of all education. Plan: Patient agrees to discharge.  Patient goals were met. Patient is being discharged due to meeting the stated rehab goals.  ?????      RINE,KATHRYN 07/03/2015, 3:04 PM Theone Murdoch, OTR/L [phone] Phone: [phone] 3:04 PM 07/03/2015 [facility] [address] [place], [name], [address] Phone: [phone]   Fax:  [phone]  Name: [name] MRN: [id number] Date of Birth: 1940-09-05
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
## 2015-07-10 ENCOUNTER — Ambulatory Visit (INDEPENDENT_AMBULATORY_CARE_PROVIDER_SITE_OTHER): Payer: Medicare Other | Admitting: Family Medicine

## 2015-07-10 ENCOUNTER — Encounter: Payer: Self-pay | Admitting: Family Medicine

## 2015-07-10 VITALS — BP 110/70 | HR 80 | Temp 98.5°F | Wt 125.5 lb

## 2015-07-10 DIAGNOSIS — M25511 Pain in right shoulder: Secondary | ICD-10-CM | POA: Diagnosis not present

## 2015-07-10 MED ORDER — HYDROCODONE-ACETAMINOPHEN 5-325 MG PO TABS: 0.5000 | ORAL_TABLET | Freq: Four times a day (QID) | ORAL | Status: DC | PRN

## 2015-07-10 NOTE — Progress Notes (Signed)
Pre visit review using our clinic review tool, if applicable. No additional management support is needed unless otherwise documented below in the visit note.  R shoulder pain. Recently in PT for gait and R shoulder.  Still with R shoulder pain.  Pain with ROM, pain at rest, and pain with sleeping on R side at night.  No L sided sx.  Prev injected by ortho ([facility]), with minimal relief.    Her prev abd sx are much better on PPI.  No ADE on med.  Doing well.  She is clearly improved on med.  D/w pt.    Meds, vitals, and allergies reviewed.   ROS: Per HPI unless specifically indicated in ROS section   nad R shoulder with pain with any AROM.  Pain with PROM with abduction>45 deg.  Pain with ext>int rotation.   AC not ttp Biceps not ttp Distally NV intact Minimal tremor noted in the hands, baseline.

## 2015-07-10 NOTE — Patient Instructions (Signed)
Take vicodin as needed for pain.  Sedation caution.  Don't drive while taking it.  Rosaria Ferries or Ebony Hail will call about your referral. Take care.  Glad to see you.

## 2015-07-10 NOTE — Assessment & Plan Note (Signed)
Likely worsening cuff sx, prev imaged.  D/w pt.  Injection didn't help prev.  Minimal help with ibuprofen.   Sedation caution on vicodin, rx given to patient.  Refer back to ortho.  Has been in PT for her shoulder already.  Anatomy and rationale d/w pt.  She agrees.

## 2015-07-17 DIAGNOSIS — M7541 Impingement syndrome of right shoulder: Secondary | ICD-10-CM | POA: Diagnosis not present

## 2015-07-22 ENCOUNTER — Other Ambulatory Visit: Payer: Self-pay | Admitting: Internal Medicine

## 2015-07-22 DIAGNOSIS — C50919 Malignant neoplasm of unspecified site of unspecified female breast: Secondary | ICD-10-CM

## 2015-07-26 DIAGNOSIS — M7541 Impingement syndrome of right shoulder: Secondary | ICD-10-CM | POA: Diagnosis not present

## 2015-08-04 DIAGNOSIS — M7541 Impingement syndrome of right shoulder: Secondary | ICD-10-CM | POA: Diagnosis not present

## 2015-08-04 DIAGNOSIS — M7501 Adhesive capsulitis of right shoulder: Secondary | ICD-10-CM | POA: Diagnosis not present

## 2015-08-04 DIAGNOSIS — M19011 Primary osteoarthritis, right shoulder: Secondary | ICD-10-CM | POA: Diagnosis not present

## 2015-08-07 ENCOUNTER — Ambulatory Visit: Payer: Medicare Other | Admitting: Occupational Therapy

## 2015-08-07 ENCOUNTER — Ambulatory Visit: Payer: Medicare Other

## 2015-08-07 ENCOUNTER — Ambulatory Visit: Payer: Medicare Other | Admitting: Physical Therapy

## 2015-08-12 DIAGNOSIS — S43431D Superior glenoid labrum lesion of right shoulder, subsequent encounter: Secondary | ICD-10-CM | POA: Diagnosis not present

## 2015-08-12 DIAGNOSIS — G8918 Other acute postprocedural pain: Secondary | ICD-10-CM | POA: Diagnosis not present

## 2015-08-12 DIAGNOSIS — M659 Synovitis and tenosynovitis, unspecified: Secondary | ICD-10-CM | POA: Diagnosis not present

## 2015-08-12 DIAGNOSIS — M75121 Complete rotator cuff tear or rupture of right shoulder, not specified as traumatic: Secondary | ICD-10-CM | POA: Diagnosis not present

## 2015-08-12 DIAGNOSIS — M19011 Primary osteoarthritis, right shoulder: Secondary | ICD-10-CM | POA: Diagnosis not present

## 2015-08-12 DIAGNOSIS — M7541 Impingement syndrome of right shoulder: Secondary | ICD-10-CM | POA: Diagnosis not present

## 2015-08-12 DIAGNOSIS — M94211 Chondromalacia, right shoulder: Secondary | ICD-10-CM | POA: Diagnosis not present

## 2015-08-12 DIAGNOSIS — M24111 Other articular cartilage disorders, right shoulder: Secondary | ICD-10-CM | POA: Diagnosis not present

## 2015-08-18 ENCOUNTER — Ambulatory Visit: Payer: Medicare Other | Admitting: Neurology

## 2015-08-27 DIAGNOSIS — Z4789 Encounter for other orthopedic aftercare: Secondary | ICD-10-CM | POA: Diagnosis not present

## 2015-09-01 DIAGNOSIS — M25611 Stiffness of right shoulder, not elsewhere classified: Secondary | ICD-10-CM | POA: Diagnosis not present

## 2015-09-03 DIAGNOSIS — M25611 Stiffness of right shoulder, not elsewhere classified: Secondary | ICD-10-CM | POA: Diagnosis not present

## 2015-09-05 NOTE — Progress Notes (Signed)
[name] was seen today in the movement disorders clinic for neurologic consultation at the request of [name], MD.  The consultation is for the evaluation of tremor.  The records that were made available to me were reviewed.  No one accompanies the patient.   The first symptom(s) the patient noticed was unilateral hand tremor on the L and this was 4-5 months ago.  She is R hand dominant.  She states that it was intermittent when it started but now it is more constant.  No tremor elsewhere.    Only living relative with PD is a cousin, who is currently being treated at [facility].  08/17/13 update:  Pt states that tremor is improved on levodopa but not sure if it is otherwise helping (but may be).  Takes med at 8am/1pm/5-7pm.  No SE.  Blood pressure has been low but states that it is always low.  No lightheadedness.  Balance is not good, but no falls.  She completed therapy.  Is exercising at home.  She walks an hr a day 6 days per week and when she goes to the gym, she will use the treadmill.  Has signed up for PD exercise class.   11/20/13 update:  Pt f/u re: PD.  Is on carbidopa/levodopa 25/100 tid.  Is doing well.  No falls.  No lightheadedness.  No hallucinations.  Exercising faithfully.   Is having an aching pain in the shoulder and in the thumb region on the R side but PD primarily affects her on the L.  Some occasional tremor on the L.    04/10/14 update:  Pt is on carbidopa/levodopa 25/100 tid.  She is stable in regards to PD but unfortunately her husband just died 72 days ago.  She just started back exercising with her sister in law.  No SI/HI.  No falls.  No lightheadeness.  Some tremor but no more than usual.  08/12/14 update:  Pt is on carbidopa/levodopa 25/100 three times per day.  She has done PT/OT/ST since our last visit and just completed her last therapy session on 08/01/14.  She had an injection in her shoulder as well and then had therapy for that and it is feeling better.   No falls since our last visit.  No lightheadedness.  She is exercising faithfully.  She is walking for exercise 6 times a week.  She admits that she is crying a lot and states that it started before her husband died and sometimes it is crying over inconsequential stuff.  Sometimes, she cries over her husbands death but doesn't want medication for that.  She is not SI/HI.  12/16/14 update:  Pt is following up today and is still on carbidopa/levodopa 25/100 tid.   No significant changes since last visit.  She goes to the gym 6 days a week.  Sometimes, she will go two times a day.   She is going to be spending thanksgiving with her son.    Wearing off:  No.  How long before next dose:  n/a Falls:   No. N/V:  No. (had some nausea this past weekend but thinks related to food she ate) Hallucinations:  No.  visual distortions: No. Lightheaded:  Yes.  , just last few days  Syncope: No. Dyskinesia:  No.   04/15/15 update:  The patient presents today for follow-up.  She has a history of Parkinson's disease.  She is currently on carbidopa/levodopa 25/100, 3 times per day.  Overall, she reports  that she is doing well.  She denies any falls.  No hallucinations.  No lightheadedness or near syncope.  She is still exercising.  She started occupational therapy for her shoulder pain.  I saw that speech therapy recommended that she see GI because she had her esophagus structure in the past.  She has an appointment with [name] on March 30.  She states that she is having a little problem swallowing but "it isn't bad yet."  States that she is crying for unknown reason (watching the news, etc).  Doesn't think that it is depression.  09/09/15 update:  The patient follows up today.  I have reviewed multiple records since her last visit.  She remains on carbidopa/levodopa 25/100, one tablet 3 times per day.  Last visit, I was going to start her on Nuedexta, but it turns out that this interfered with her tamoxifen.  We ended  up starting her on Effexor XR, 75 mg daily.  The patient states that the crying spells resolved with the effexor.  She did have a modified barium swallow on 04/21/2015 that was normal.  She continued to have a globus sensation and saw [name] for this.  He felt that she had mild oropharyngeal dysphagia related to Parkinson's, but that was not demonstrated on her barium swallow.  He did do a CT of the abdomen that was negative.  The patient did go to the emergency room on 05/07/2015 with headache.  That has since resolved.  She has also been to Dr. [name] with lightheadedness, and also described vertigo to the physical therapist and they recommended vestibular rehabilitation.  She has been going to rehabilitation therapy.   That did help.  She has not had any falls.  No syncopal episodes.  No falls.  She does tell me she had surgery on 7/18 by [name] for rotator cuff.  She is still in a sling.     Neuroimaging has not previously been performed.  It was supposed to be done 06/01/13 but pt cancelled as she was claustrophobic and couldn't do the scan even though it was in an open unit.   PREVIOUS MEDICATIONS: none to date  ALLERGIES:   Allergies  Allergen Reactions  . Penicillins Shortness Of Breath and Itching    CURRENT MEDICATIONS:  Current Outpatient Prescriptions on File Prior to Visit  Medication Sig Dispense Refill  . aspirin 81 MG tablet Take 81 mg by mouth daily.      [name] b complex vitamins tablet Take 1 tablet by mouth daily.    . Calcium 1500 MG tablet Take 1,500 mg by mouth.      . carbidopa-levodopa (SINEMET IR) 25-100 MG tablet Take 1 tablet by mouth 3 (three) times daily. 90 tablet 5  . cholecalciferol (VITAMIN D) 1000 UNITS tablet Take 5,000 Units by mouth daily.      . Denosumab (PROLIA [place]) Inject into the skin every 6 (six) months.    . fluticasone (FLONASE) 50 MCG/ACT nasal spray Place 2 sprays into both nostrils daily. 16 g 1  . HYDROcodone-acetaminophen (NORCO/VICODIN) 5-325  MG tablet Take 0.5-1 tablets by mouth every 6 (six) hours as needed for moderate pain (sedation caution). 30 tablet 0  . ibuprofen (ADVIL,MOTRIN) 800 MG tablet Take 1 tablet (800 mg total) by mouth 3 (three) times daily. 21 tablet 0  . Multiple Vitamin (MULTIVITAMIN) capsule 2 tabs po qd     . nitroGLYCERIN (NITROSTAT) 0.3 MG SL tablet Place 1 tablet (0.3 mg total)  under the tongue every 5 (five) minutes as needed (max 3 doses). Reported on 04/07/2015 25 tablet 3  . Omega-3 Fatty Acids (OMEGA 3 PO) 1 tab po qd     . omeprazole (PRILOSEC OTC) 20 MG tablet Take 1 tablet (20 mg total) by mouth daily. 90 tablet 1  . tamoxifen (NOLVADEX) 20 MG tablet TAKE ONE TABLET BY MOUTH ONCE DAILY 30 tablet 2  . traMADol (ULTRAM) 50 MG tablet TAKE ONE TO TWO TABLETS BY MOUTH AT BEDTIME AS NEEDED FOR PAIN 60 tablet 5  . venlafaxine XR (EFFEXOR-XR) 75 MG 24 hr capsule TAKE ONE CAPSULE BY MOUTH ONCE DAILY WITH  BREAKFAST 30 capsule 2  . vitamin B-12 (CYANOCOBALAMIN) 500 MCG tablet Take 2500 mcg daily.     No current facility-administered medications on file prior to visit.     PAST MEDICAL HISTORY:   Past Medical History:  Diagnosis Date  . Allergy    SEASONAL  . Anemia    Anemia-NOS / PMH, Dr Julien Nordmann  . Arthritis   . Breast cancer ([place])    stage I right breast  . Cataract    BILATERAL  . Decreased vision    R eye  . Esophageal Stricture   . Hyperlipidemia   . Monoclonal gammopathy   . MVP (mitral valve prolapse)   . Osteoporosis   . Skin cancer    basal cell L neck  . Vertical diplopia     PAST SURGICAL HISTORY:   Past Surgical History:  Procedure Laterality Date  . APPENDECTOMY  2002  . BONE BIOPSY  11/06/10  . BREAST LUMPECTOMY  02/2010   right breast lumpectomy and sentinel node biopsy  . CARDIAC CATHETERIZATION  2005   neg  . COLONOSCOPY     negative   . HEMORRHOID SURGERY  1970's  . SALPINGOOPHORECTOMY  2002   for benign growths  . TOTAL ABDOMINAL HYSTERECTOMY W/ BILATERAL  SALPINGOOPHORECTOMY  75 yrs old   for pain,prolapse ; G3 P2  . UPPER GASTROINTESTINAL ENDOSCOPY      SOCIAL HISTORY:   Social History   Social History  . Marital status: Widowed    Spouse name: N/A  . Number of children: 2  . Years of education: N/A   Occupational History  . Retired    Social History Main Topics  . Smoking status: Never Smoker  . Smokeless tobacco: Never Used  . Alcohol use No  . Drug use: No  . Sexual activity: Not on file   Other Topics Concern  . Not on file   Social History Narrative   Occupation: Textile   Widowed 2016, husband had cancer, was married in 1959    Patient has never smoked.    Alcohol Use - no   Illicit Drug Use - no   Patient does not get regular exercise.    Daily Caffeine Use: 3 cups of coffee daily    FAMILY HISTORY:   Family Status  Relation Status  . Mother Deceased   heart disease  . Brother Alive   diabetes  . Brother Deceased   heart disease, renal failure  . Father Deceased   hepatitis  . Sister Alive   diabetes  . Brother Deceased   drowned as a teenager  . Brother Alive   diabetes  . Brother Alive   diabetes  . Sister Alive   diabetes, liver disease (alcohol)  . Sister Alive   healthy  . Son Alive   sarcoidosis  . Daughter  Alive   healthy  . Cousin Alive   Parkinsons disease    ROS:  A complete 10 system review of systems was obtained and was unremarkable apart from what is mentioned above.  PHYSICAL EXAMINATION:    VITALS:   Vitals:   09/09/15 1046  Weight: 124 lb (56.2 kg)  Height: 5\' 5"  (1.651 m)    GEN:  The patient appears stated age and is in NAD.   HEENT:  Normocephalic, atraumatic.  The mucous membranes are moist. The superficial temporal arteries are without ropiness or tenderness. CV:  RRR Lungs:  CTAB Neck:  No bruits   Neurological examination:  Orientation: The patient is alert and oriented x3.  Cranial nerves: There is good facial symmetry. There is facial hypomimia.  The speech is fluent and clear. Soft palate rises symmetrically and there is no tongue deviation. Hearing is intact to conversational tone. Sensation: Sensation is intact to light touch throughout Motor: Strength is 5/5 in the bilateral upper and lower extremities.   Shoulder shrug is equal and symmetric.  There is no pronator drift. Deep tendon reflexes: Deep tendon reflexes are 3/4 at the bilateral biceps, triceps, brachioradialis, patella and achilles. Plantar responses are downgoing bilaterally.  Movement examination: Tone: There is normal tone in the LUE; right is in a sling Abnormal movements: There is LUE tremor, that is very rare Coordination:  There is good RAM's today in the LUE and bilateral LE and good finger taps on the right (otherwise R arm in sling) Gait and Station: The patient has no difficulty arising out of a deep-seated chair without the use of the hands.  The patient's stride length is normal with slight decrease in arm swing on the L.    ASSESSMENT/PLAN:  1.  Idiopathic Parkinson's disease.  She was dx in May, 2015.    -She is doing well on carbidopa/levodopa 25/100 tid.    - Has decreased exercise with shoulder surgery but with all the CP (described as cinder blocks on the chest - but none currently) told her to wait on exercise until sees [name].  Took 11 nitro this month. 2.  Depression  -continue effexor.  Crying spells resolved with this this small dose.   Risks, benefits, side effects and alternative therapies were discussed.  The opportunity to ask questions was given and they were answered to the best of my ability.  The patient expressed understanding and willingness to follow the outlined treatment protocols.  Samples given.   3.  Dysphagia  -MBE on 04/21/15 was normal  -[name] did not think this was GI issue. 4.  Orthostatic hypotension  -Pt states that she already drinking 60 oz of water per day  -I was going to start the patient on northera as her BP  was very low but after a long discussion, she told me she took 11 nitro this month.  She hasn't seen [name] since 2012 and I am going to make an appt with him before I get this started.  Nitro could be exacerbating this BP issue 4.  Follow up is anticipated in the next few months, sooner should new neurologic issues arise.  Much greater than 50% of this visit was spent in counseling with the patient and the family.  Total face to face time:  35 min

## 2015-09-08 ENCOUNTER — Telehealth: Payer: Self-pay | Admitting: Neurology

## 2015-09-08 NOTE — Telephone Encounter (Signed)
Patient instructed to keep follow up

## 2015-09-08 NOTE — Telephone Encounter (Signed)
[name] Dec 29, 2040. Her # is  [phone]. She called needing to know if she should keep her appointment for tomorrow. She has had rotator cuff surgery and needs to be in her sling a few more weeks. Would it be ok to come in even though her Right arm is ina sling? Thank you

## 2015-09-09 ENCOUNTER — Encounter: Payer: Self-pay | Admitting: Neurology

## 2015-09-09 ENCOUNTER — Ambulatory Visit (INDEPENDENT_AMBULATORY_CARE_PROVIDER_SITE_OTHER): Payer: Medicare Other | Admitting: Neurology

## 2015-09-09 VITALS — Ht 65.0 in | Wt 124.0 lb

## 2015-09-09 DIAGNOSIS — F458 Other somatoform disorders: Secondary | ICD-10-CM | POA: Diagnosis not present

## 2015-09-09 DIAGNOSIS — G20 Parkinson's disease: Secondary | ICD-10-CM

## 2015-09-09 DIAGNOSIS — R079 Chest pain, unspecified: Secondary | ICD-10-CM

## 2015-09-09 DIAGNOSIS — M25611 Stiffness of right shoulder, not elsewhere classified: Secondary | ICD-10-CM | POA: Diagnosis not present

## 2015-09-09 DIAGNOSIS — R1319 Other dysphagia: Secondary | ICD-10-CM

## 2015-09-09 NOTE — Patient Instructions (Signed)
Appt scheduled with Cardiology on Friday 09/12/15 at 10:15 am.

## 2015-09-09 NOTE — Progress Notes (Signed)
?   On my schedule in future. She has not been seen since 2012 so could be seen by anyone if I have no office time

## 2015-09-11 ENCOUNTER — Encounter: Payer: Self-pay | Admitting: Cardiovascular Disease

## 2015-09-11 DIAGNOSIS — M25611 Stiffness of right shoulder, not elsewhere classified: Secondary | ICD-10-CM | POA: Diagnosis not present

## 2015-09-11 NOTE — Progress Notes (Signed)
Cardiology Office Note   Date:  09/12/2015   ID:  [name], [name] 1940-03-10, MRN [id number]  PCP:  [name], MD  Cardiologist:   Jenkins Rouge, MD   No chief complaint on file.     History of Present Illness: [name] is a 75 y.o. female who presents for evaluation of chest pain Last seen by me in 2012 after right mastectomy and Dx of breast cancer and myeloma. Had atypical chest pain during XRT. Echos showed normal EF and no MVP trivial MR  despite patients reported history of such. Seen by Dr Tat 09/09/15 for movement disorder and tremor on carbidopa/levodopa and Effexor for depression. Has mild oropharyngeal dysphagia related to Parkinsons. Complained of "cinder blocks sitting on chest " and taking nitro Some orthostasis despite PO hydration and Dr Tat did not want to start Northera until cardiac status cleared  She initially had nitro given to her by Dr Linna Darner for pains from MVP Her pain is atypical being at rest or exertion  She had no issues with Right rotator cuff surgery with Dr supple and still has her arm in sling Going to PT    Past Medical History:  Diagnosis Date  . Allergy    SEASONAL  . Anemia    Anemia-NOS / PMH, Dr Julien Nordmann  . Arthritis   . Breast cancer ([place])    stage I right breast  . Cataract    BILATERAL  . Decreased vision    R eye  . Esophageal Stricture   . Hyperlipidemia   . Monoclonal gammopathy   . MVP (mitral valve prolapse)   . Osteoporosis   . Skin cancer    basal cell L neck  . Vertical diplopia     Past Surgical History:  Procedure Laterality Date  . APPENDECTOMY  2002  . BONE BIOPSY  11/06/10  . BREAST LUMPECTOMY  02/2010   right breast lumpectomy and sentinel node biopsy  . CARDIAC CATHETERIZATION  2005   neg  . COLONOSCOPY     negative   . HEMORRHOID SURGERY  1970's  . SALPINGOOPHORECTOMY  2002   for benign growths  . TOTAL ABDOMINAL HYSTERECTOMY W/ BILATERAL SALPINGOOPHORECTOMY  75 yrs old   for  pain,prolapse ; G3 P2  . UPPER GASTROINTESTINAL ENDOSCOPY       Current Outpatient Prescriptions  Medication Sig Dispense Refill  . aspirin 81 MG tablet Take 81 mg by mouth daily.      [name] b complex vitamins tablet Take 1 tablet by mouth daily.    . Calcium 1500 MG tablet Take 1,500 mg by mouth.      . carbidopa-levodopa (SINEMET IR) 25-100 MG tablet Take 1 tablet by mouth 3 (three) times daily. 90 tablet 5  . cholecalciferol (VITAMIN D) 1000 UNITS tablet Take 5,000 Units by mouth daily.      . Denosumab (PROLIA [place]) Inject into the skin every 6 (six) months.    . diazepam (VALIUM) 5 MG tablet Take 5 mg by mouth as directed.    . fluticasone (FLONASE) 50 MCG/ACT nasal spray Place 2 sprays into both nostrils daily. 16 g 1  . HYDROcodone-acetaminophen (NORCO/VICODIN) 5-325 MG tablet Take 0.5-1 tablets by mouth every 6 (six) hours as needed for moderate pain (sedation caution). 30 tablet 0  . ibuprofen (ADVIL,MOTRIN) 800 MG tablet Take 1 tablet (800 mg total) by mouth 3 (three) times daily. 21 tablet 0  . Multiple Vitamin (MULTIVITAMIN) capsule 2 tabs po qd     .  naproxen (NAPROSYN) 500 MG tablet Take 500 mg by mouth as directed.    . nitroGLYCERIN (NITROSTAT) 0.3 MG SL tablet Place 1 tablet (0.3 mg total) under the tongue every 5 (five) minutes as needed (max 3 doses). Reported on 04/07/2015 25 tablet 3  . Omega-3 Fatty Acids (OMEGA 3 PO) 1 tab po qd     . omeprazole (PRILOSEC OTC) 20 MG tablet Take 1 tablet (20 mg total) by mouth daily. 90 tablet 1  . oxyCODONE-acetaminophen (PERCOCET/ROXICET) 5-325 MG tablet Take 1 tablet by mouth as directed.    . tamoxifen (NOLVADEX) 20 MG tablet TAKE ONE TABLET BY MOUTH ONCE DAILY 30 tablet 2  . traMADol (ULTRAM) 50 MG tablet TAKE ONE TO TWO TABLETS BY MOUTH AT BEDTIME AS NEEDED FOR PAIN 60 tablet 5  . venlafaxine XR (EFFEXOR-XR) 75 MG 24 hr capsule TAKE ONE CAPSULE BY MOUTH ONCE DAILY WITH  BREAKFAST 30 capsule 2  . vitamin B-12 (CYANOCOBALAMIN) 500 MCG  tablet Take 2500 mcg daily.     No current facility-administered medications for this visit.     Allergies:   Penicillins    Social History:  The patient  reports that she has never smoked. She has never used smokeless tobacco. She reports that she does not drink alcohol or use drugs.   Family History:  The patient's family history includes Diabetes in her brother, brother, brother, and mother; Heart disease in her brother and mother; Hepatitis in her father.    ROS:  Please see the history of present illness.   Otherwise, review of systems are positive for none.   All other systems are reviewed and negative.    PHYSICAL EXAM: VS:  BP 132/82   Pulse 85   Ht 5\' 5"  (1.651 m)   Wt 123 lb 6.4 oz (56 kg)   SpO2 97%   BMI 20.53 kg/m  , BMI Body mass index is 20.53 kg/m. Chronically ill thin female Healthy:  appears stated age 69: normal Neck supple with no adenopathy JVP normal no bruits no thyromegaly Lungs clear with no wheezing and good diaphragmatic motion Heart:  S1/S2 no murmur, no rub, gallop or click PMI normal Abdomen: benighn, BS positve, no tenderness, no AAA no bruit.  No HSM or HJR Distal pulses intact with no bruits No edema Neuro non-focal  LUE tremor  Skin warm and dry No muscular weakness    EKG:  03/03/10 SR rate 75 short PR nonspecific ST/T wave changes    Recent Labs: 03/31/2015: ALT 18; BUN 14.8; Creatinine 0.8; HGB 12.0; Platelets 204; Potassium 3.6; Sodium 142    Lipid Panel    Component Value Date/Time   CHOL 160 07/12/2014 0838   TRIG 75.0 07/12/2014 0838   HDL 69.00 07/12/2014 0838   CHOLHDL 2 07/12/2014 0838   VLDL 15.0 07/12/2014 0838   LDLCALC 76 07/12/2014 0838      Wt Readings from Last 3 Encounters:  09/12/15 123 lb 6.4 oz (56 kg)  09/09/15 124 lb (56.2 kg)  07/10/15 125 lb 8 oz (56.9 kg)      Other studies Reviewed: Additional studies/ records that were reviewed today include: notes Dr Tat Cardiology records 2012  .    ASSESSMENT AND PLAN:  1.  Chest pain atypical f/u lexiscan myovue 2. MVP no murmur f/u echo  3. Parkinson's f/u Tat start Northera if heart checks out ok 4. Ortho:  PT/OT post rotator cuff surgery Supple    Current medicines are reviewed at length  with the patient today.  The patient does not have concerns regarding medicines.  The following changes have been made:  no change  Labs/ tests ordered today include: Echo / Myovue  No orders of the defined types were placed in this encounter.    Disposition:   FU with me in a year      Signed, Jenkins Rouge, MD  09/12/2015 10:11 AM    [facility] [address], [place], [place]  [address] Phone: [phone]; Fax: [phone]

## 2015-09-12 ENCOUNTER — Ambulatory Visit (INDEPENDENT_AMBULATORY_CARE_PROVIDER_SITE_OTHER): Payer: Medicare Other | Admitting: Cardiovascular Disease

## 2015-09-12 ENCOUNTER — Encounter: Payer: Self-pay | Admitting: Cardiovascular Disease

## 2015-09-12 VITALS — BP 132/82 | HR 85 | Ht 65.0 in | Wt 123.4 lb

## 2015-09-12 DIAGNOSIS — R0789 Other chest pain: Secondary | ICD-10-CM

## 2015-09-12 DIAGNOSIS — I341 Nonrheumatic mitral (valve) prolapse: Secondary | ICD-10-CM

## 2015-09-12 NOTE — Patient Instructions (Addendum)
Medication Instructions:  Your physician recommends that you continue on your current medications as directed. Please refer to the Current Medication list given to you today.  Labwork: NONE  Testing/Procedures: Your physician has requested that you have an echocardiogram. Echocardiography is a painless test that uses sound waves to create images of your heart. It provides your doctor with information about the size and shape of your heart and how well your heart's chambers and valves are working. This procedure takes approximately one hour. There are no restrictions for this procedure.  Your physician has requested that you have a lexiscan myoview. For further information please visit [email or web address]. Please follow instruction sheet, as given.  Follow-Up: Your physician wants you to follow-up in: 12 months with Dr. Nishan. You will receive a reminder letter in the mail two months in advance. If you don't receive a letter, please call our office to schedule the follow-up appointment.   If you need a refill on your cardiac medications before your next appointment, please call your pharmacy.    

## 2015-09-15 ENCOUNTER — Telehealth (HOSPITAL_COMMUNITY): Payer: Self-pay | Admitting: *Deleted

## 2015-09-15 DIAGNOSIS — M25611 Stiffness of right shoulder, not elsewhere classified: Secondary | ICD-10-CM | POA: Diagnosis not present

## 2015-09-15 NOTE — Telephone Encounter (Signed)
Patient given detailed instructions per Myocardial Perfusion Study Information Sheet for the test on 09/16/15 at 0745. Patient notified to arrive 15 minutes early and that it is imperative to arrive on time for appointment to keep from having the test rescheduled.  If you need to cancel or reschedule your appointment, please call the office within 24 hours of your appointment. Failure to do so may result in a cancellation of your appointment, and a $50 no show fee. Patient verbalized understanding.Nihal Doan, Ranae Palms

## 2015-09-16 ENCOUNTER — Ambulatory Visit (HOSPITAL_COMMUNITY): Payer: Medicare Other | Attending: Cardiology

## 2015-09-16 DIAGNOSIS — R0789 Other chest pain: Secondary | ICD-10-CM | POA: Diagnosis not present

## 2015-09-16 DIAGNOSIS — Z8249 Family history of ischemic heart disease and other diseases of the circulatory system: Secondary | ICD-10-CM | POA: Insufficient documentation

## 2015-09-16 DIAGNOSIS — R0602 Shortness of breath: Secondary | ICD-10-CM | POA: Diagnosis not present

## 2015-09-16 DIAGNOSIS — R0609 Other forms of dyspnea: Secondary | ICD-10-CM | POA: Diagnosis not present

## 2015-09-16 LAB — MYOCARDIAL PERFUSION IMAGING
Estimated workload: 1 METS
LV dias vol: 62 mL (ref 46–106)
LV sys vol: 21 mL
Peak HR: 101 {beats}/min
Percent of predicted max HR: 68 %
RATE: 0.28
Rest HR: 76 {beats}/min
SDS: 3
SRS: 0
SSS: 3
Stage 1 DBP: 70 mmHg
Stage 1 Grade: 0 %
Stage 1 HR: 77 {beats}/min
Stage 1 SBP: 120 mmHg
Stage 1 Speed: 0 mph
Stage 2 Grade: 0 %
Stage 2 HR: 77 {beats}/min
Stage 2 Speed: 0 mph
Stage 3 Grade: 0 %
Stage 3 HR: 93 {beats}/min
Stage 3 Speed: 0 mph
Stage 4 Grade: 0 %
Stage 4 HR: 101 {beats}/min
Stage 4 Speed: 0 mph
Stage 5 DBP: 63 mmHg
Stage 5 Grade: 0 %
Stage 5 HR: 94 {beats}/min
Stage 5 SBP: 120 mmHg
Stage 5 Speed: 0 mph
Stage 6 DBP: 62 mmHg
Stage 6 Grade: 0 %
Stage 6 HR: 86 {beats}/min
Stage 6 SBP: 120 mmHg
Stage 6 Speed: 0 mph
TID: 1.07

## 2015-09-16 MED ORDER — TECHNETIUM TC 99M TETROFOSMIN IV KIT
10.3000 | PACK | Freq: Once | INTRAVENOUS | Status: AC | PRN
  Administered 2015-09-16: 10 via INTRAVENOUS
  Filled 2015-09-16: qty 10

## 2015-09-16 MED ORDER — TECHNETIUM TC 99M TETROFOSMIN IV KIT
32.7000 | PACK | Freq: Once | INTRAVENOUS | Status: AC | PRN
  Administered 2015-09-16: 32.7 via INTRAVENOUS
  Filled 2015-09-16: qty 33

## 2015-09-16 MED ORDER — REGADENOSON 0.4 MG/5ML IV SOLN
0.4000 mg | Freq: Once | INTRAVENOUS | Status: AC
  Administered 2015-09-16: 0.4 mg via INTRAVENOUS

## 2015-09-17 DIAGNOSIS — M25611 Stiffness of right shoulder, not elsewhere classified: Secondary | ICD-10-CM | POA: Diagnosis not present

## 2015-09-20 ENCOUNTER — Telehealth: Payer: Self-pay | Admitting: Internal Medicine

## 2015-09-20 NOTE — Telephone Encounter (Signed)
Moved 9/13 appointments to 9/18 due to working in a tx patient. Called patient several times and due to signal was not able to to hold a complete conversation re change and new date/time - patient signal lost several times. Schedule mailed.

## 2015-09-22 DIAGNOSIS — M25611 Stiffness of right shoulder, not elsewhere classified: Secondary | ICD-10-CM | POA: Diagnosis not present

## 2015-09-23 ENCOUNTER — Other Ambulatory Visit: Payer: Self-pay

## 2015-09-23 ENCOUNTER — Ambulatory Visit (HOSPITAL_COMMUNITY): Payer: Medicare Other | Attending: Cardiology

## 2015-09-23 DIAGNOSIS — I059 Rheumatic mitral valve disease, unspecified: Secondary | ICD-10-CM | POA: Diagnosis present

## 2015-09-23 DIAGNOSIS — I340 Nonrheumatic mitral (valve) insufficiency: Secondary | ICD-10-CM | POA: Insufficient documentation

## 2015-09-23 DIAGNOSIS — I313 Pericardial effusion (noninflammatory): Secondary | ICD-10-CM | POA: Insufficient documentation

## 2015-09-23 DIAGNOSIS — I341 Nonrheumatic mitral (valve) prolapse: Secondary | ICD-10-CM | POA: Insufficient documentation

## 2015-09-23 DIAGNOSIS — I071 Rheumatic tricuspid insufficiency: Secondary | ICD-10-CM | POA: Diagnosis not present

## 2015-09-23 DIAGNOSIS — I119 Hypertensive heart disease without heart failure: Secondary | ICD-10-CM | POA: Diagnosis not present

## 2015-09-24 ENCOUNTER — Telehealth: Payer: Self-pay | Admitting: Cardiovascular Disease

## 2015-09-24 ENCOUNTER — Other Ambulatory Visit: Payer: Self-pay | Admitting: Neurology

## 2015-09-24 DIAGNOSIS — Z4789 Encounter for other orthopedic aftercare: Secondary | ICD-10-CM | POA: Diagnosis not present

## 2015-09-24 NOTE — Telephone Encounter (Signed)
Called patient with echo results. Per [name], normal echo with good EF and no significant valvular heart disease. Patient verbalized understanding.

## 2015-09-24 NOTE — Telephone Encounter (Signed)
New message  Pt verbalized that she is returning call to rn

## 2015-09-25 DIAGNOSIS — M25611 Stiffness of right shoulder, not elsewhere classified: Secondary | ICD-10-CM | POA: Diagnosis not present

## 2015-09-26 ENCOUNTER — Ambulatory Visit (INDEPENDENT_AMBULATORY_CARE_PROVIDER_SITE_OTHER): Payer: Medicare Other

## 2015-09-26 ENCOUNTER — Ambulatory Visit: Payer: Medicare Other

## 2015-09-26 DIAGNOSIS — Z23 Encounter for immunization: Secondary | ICD-10-CM

## 2015-09-30 DIAGNOSIS — M25611 Stiffness of right shoulder, not elsewhere classified: Secondary | ICD-10-CM | POA: Diagnosis not present

## 2015-10-02 DIAGNOSIS — M25611 Stiffness of right shoulder, not elsewhere classified: Secondary | ICD-10-CM | POA: Diagnosis not present

## 2015-10-06 DIAGNOSIS — M25611 Stiffness of right shoulder, not elsewhere classified: Secondary | ICD-10-CM | POA: Diagnosis not present

## 2015-10-08 ENCOUNTER — Ambulatory Visit: Payer: Medicare Other

## 2015-10-08 ENCOUNTER — Other Ambulatory Visit: Payer: Medicare Other

## 2015-10-08 ENCOUNTER — Ambulatory Visit: Payer: Medicare Other | Admitting: Internal Medicine

## 2015-10-08 DIAGNOSIS — M25611 Stiffness of right shoulder, not elsewhere classified: Secondary | ICD-10-CM | POA: Diagnosis not present

## 2015-10-10 ENCOUNTER — Other Ambulatory Visit (INDEPENDENT_AMBULATORY_CARE_PROVIDER_SITE_OTHER): Payer: Medicare Other

## 2015-10-10 ENCOUNTER — Other Ambulatory Visit: Payer: Self-pay | Admitting: Family Medicine

## 2015-10-10 DIAGNOSIS — E785 Hyperlipidemia, unspecified: Secondary | ICD-10-CM

## 2015-10-10 DIAGNOSIS — E049 Nontoxic goiter, unspecified: Secondary | ICD-10-CM

## 2015-10-10 DIAGNOSIS — M810 Age-related osteoporosis without current pathological fracture: Secondary | ICD-10-CM

## 2015-10-10 LAB — LIPID PANEL
Cholesterol: 166 mg/dL (ref 0–200)
HDL: 68.8 mg/dL (ref >39.00)
LDL Cholesterol: 86 mg/dL (ref 0–99)
NonHDL: 96.82
Total CHOL/HDL Ratio: 2
Triglycerides: 55 mg/dL (ref 0.0–149.0)
VLDL: 11 mg/dL (ref 0.0–40.0)

## 2015-10-10 LAB — COMPREHENSIVE METABOLIC PANEL
ALT: 24 U/L (ref 0–35)
AST: 30 U/L (ref 0–37)
Albumin: 3.4 g/dL — ABNORMAL LOW (ref 3.5–5.2)
Alkaline Phosphatase: 42 U/L (ref 39–117)
BUN: 16 mg/dL (ref 6–23)
CO2: 31 mEq/L (ref 19–32)
Calcium: 8.6 mg/dL (ref 8.4–10.5)
Chloride: 105 mEq/L (ref 96–112)
Creatinine, Ser: 0.74 mg/dL (ref 0.40–1.20)
GFR: 81.53 mL/min (ref >60.00)
Glucose, Bld: 88 mg/dL (ref 70–99)
Potassium: 4.2 mEq/L (ref 3.5–5.1)
Sodium: 143 mEq/L (ref 135–145)
Total Bilirubin: 0.4 mg/dL (ref 0.2–1.2)
Total Protein: 6.6 g/dL (ref 6.0–8.3)

## 2015-10-10 LAB — TSH: TSH: 1.48 u[IU]/mL (ref 0.35–4.50)

## 2015-10-10 LAB — VITAMIN D 25 HYDROXY (VIT D DEFICIENCY, FRACTURES): VITD: 39.36 ng/mL (ref 30.00–100.00)

## 2015-10-13 ENCOUNTER — Ambulatory Visit (HOSPITAL_BASED_OUTPATIENT_CLINIC_OR_DEPARTMENT_OTHER): Payer: Medicare Other

## 2015-10-13 ENCOUNTER — Telehealth: Payer: Self-pay | Admitting: Internal Medicine

## 2015-10-13 ENCOUNTER — Other Ambulatory Visit (HOSPITAL_BASED_OUTPATIENT_CLINIC_OR_DEPARTMENT_OTHER): Payer: Medicare Other

## 2015-10-13 ENCOUNTER — Encounter: Payer: Self-pay | Admitting: Internal Medicine

## 2015-10-13 ENCOUNTER — Ambulatory Visit (HOSPITAL_BASED_OUTPATIENT_CLINIC_OR_DEPARTMENT_OTHER): Payer: Medicare Other | Admitting: Internal Medicine

## 2015-10-13 VITALS — BP 126/62 | HR 80 | Temp 98.3°F | Resp 18 | Ht 65.0 in | Wt 126.1 lb

## 2015-10-13 DIAGNOSIS — D472 Monoclonal gammopathy: Secondary | ICD-10-CM

## 2015-10-13 DIAGNOSIS — C50011 Malignant neoplasm of nipple and areola, right female breast: Secondary | ICD-10-CM

## 2015-10-13 DIAGNOSIS — Z79811 Long term (current) use of aromatase inhibitors: Secondary | ICD-10-CM

## 2015-10-13 DIAGNOSIS — C50911 Malignant neoplasm of unspecified site of right female breast: Secondary | ICD-10-CM

## 2015-10-13 DIAGNOSIS — C50411 Malignant neoplasm of upper-outer quadrant of right female breast: Secondary | ICD-10-CM

## 2015-10-13 LAB — COMPREHENSIVE METABOLIC PANEL
ALT: <9 U/L (ref 0–55)
AST: 31 U/L (ref 5–34)
Albumin: 3.1 g/dL — ABNORMAL LOW (ref 3.5–5.0)
Alkaline Phosphatase: 46 U/L (ref 40–150)
Anion Gap: 8 mEq/L (ref 3–11)
BUN: 16.9 mg/dL (ref 7.0–26.0)
CO2: 27 mEq/L (ref 22–29)
Calcium: 8.8 mg/dL (ref 8.4–10.4)
Chloride: 109 mEq/L (ref 98–109)
Creatinine: 0.8 mg/dL (ref 0.6–1.1)
EGFR: 77 mL/min/{1.73_m2} — ABNORMAL LOW (ref >90)
Glucose: 144 mg/dl — ABNORMAL HIGH (ref 70–140)
Potassium: 4.1 mEq/L (ref 3.5–5.1)
Sodium: 144 mEq/L (ref 136–145)
Total Bilirubin: 0.31 mg/dL (ref 0.20–1.20)
Total Protein: 6.6 g/dL (ref 6.4–8.3)

## 2015-10-13 LAB — CBC WITH DIFFERENTIAL/PLATELET
BASO%: 0.9 % (ref 0.0–2.0)
Basophils Absolute: 0 10*3/uL (ref 0.0–0.1)
EOS%: 6.3 % (ref 0.0–7.0)
Eosinophils Absolute: 0.3 10*3/uL (ref 0.0–0.5)
HCT: 35.4 % (ref 34.8–46.6)
HGB: 11.6 g/dL (ref 11.6–15.9)
LYMPH%: 32.3 % (ref 14.0–49.7)
MCH: 32.4 pg (ref 25.1–34.0)
MCHC: 32.7 g/dL (ref 31.5–36.0)
MCV: 98.9 fL (ref 79.5–101.0)
MONO#: 0.3 10*3/uL (ref 0.1–0.9)
MONO%: 6.3 % (ref 0.0–14.0)
NEUT#: 3 10*3/uL (ref 1.5–6.5)
NEUT%: 54.2 % (ref 38.4–76.8)
Platelets: 212 10*3/uL (ref 145–400)
RBC: 3.58 10*6/uL — ABNORMAL LOW (ref 3.70–5.45)
RDW: 14.8 % — ABNORMAL HIGH (ref 11.2–14.5)
WBC: 5.5 10*3/uL (ref 3.9–10.3)
lymph#: 1.8 10*3/uL (ref 0.9–3.3)

## 2015-10-13 MED ORDER — DENOSUMAB 60 MG/ML ~~LOC~~ SOLN
60.0000 mg | Freq: Once | SUBCUTANEOUS | Status: AC
  Administered 2015-10-13: 60 mg via SUBCUTANEOUS
  Filled 2015-10-13: qty 1

## 2015-10-13 NOTE — Patient Instructions (Signed)
Denosumab injection  What is this medicine?  DENOSUMAB (den oh sue mab) slows bone breakdown. Prolia is used to treat osteoporosis in women after menopause and in men. Xgeva is used to prevent bone fractures and other bone problems caused by cancer bone metastases. Xgeva is also used to treat giant cell tumor of the bone.  This medicine may be used for other purposes; ask your health care provider or pharmacist if you have questions.  What should I tell my health care provider before I take this medicine?  They need to know if you have any of these conditions:  -dental disease  -eczema  -infection or history of infections  -kidney disease or on dialysis  -low blood calcium or vitamin D  -malabsorption syndrome  -scheduled to have surgery or tooth extraction  -taking medicine that contains denosumab  -thyroid or parathyroid disease  -an unusual reaction to denosumab, other medicines, foods, dyes, or preservatives  -pregnant or trying to get pregnant  -breast-feeding  How should I use this medicine?  This medicine is for injection under the skin. It is given by a health care professional in a hospital or clinic setting.  If you are getting Prolia, a special MedGuide will be given to you by the pharmacist with each prescription and refill. Be sure to read this information carefully each time.  For Prolia, talk to your pediatrician regarding the use of this medicine in children. Special care may be needed. For Xgeva, talk to your pediatrician regarding the use of this medicine in children. While this drug may be prescribed for children as young as 13 years for selected conditions, precautions do apply.  Overdosage: If you think you have taken too much of this medicine contact a poison control center or emergency room at once.  NOTE: This medicine is only for you. Do not share this medicine with others.  What if I miss a dose?  It is important not to miss your dose. Call your doctor or health care professional if you are  unable to keep an appointment.  What may interact with this medicine?  Do not take this medicine with any of the following medications:  -other medicines containing denosumab  This medicine may also interact with the following medications:  -medicines that suppress the immune system  -medicines that treat cancer  -steroid medicines like prednisone or cortisone  This list may not describe all possible interactions. Give your health care provider a list of all the medicines, herbs, non-prescription drugs, or dietary supplements you use. Also tell them if you smoke, drink alcohol, or use illegal drugs. Some items may interact with your medicine.  What should I watch for while using this medicine?  Visit your doctor or health care professional for regular checks on your progress. Your doctor or health care professional may order blood tests and other tests to see how you are doing.  Call your doctor or health care professional if you get a cold or other infection while receiving this medicine. Do not treat yourself. This medicine may decrease your body's ability to fight infection.  You should make sure you get enough calcium and vitamin D while you are taking this medicine, unless your doctor tells you not to. Discuss the foods you eat and the vitamins you take with your health care professional.  See your dentist regularly. Brush and floss your teeth as directed. Before you have any dental work done, tell your dentist you are receiving this medicine.  Do   not become pregnant while taking this medicine or for 5 months after stopping it. Women should inform their doctor if they wish to become pregnant or think they might be pregnant. There is a potential for serious side effects to an unborn child. Talk to your health care professional or pharmacist for more information.  What side effects may I notice from receiving this medicine?  Side effects that you should report to your doctor or health care professional as soon as  possible:  -allergic reactions like skin rash, itching or hives, swelling of the face, lips, or tongue  -breathing problems  -chest pain  -fast, irregular heartbeat  -feeling faint or lightheaded, falls  -fever, chills, or any other sign of infection  -muscle spasms, tightening, or twitches  -numbness or tingling  -skin blisters or bumps, or is dry, peels, or red  -slow healing or unexplained pain in the mouth or jaw  -unusual bleeding or bruising  Side effects that usually do not require medical attention (Report these to your doctor or health care professional if they continue or are bothersome.):  -muscle pain  -stomach upset, gas  This list may not describe all possible side effects. Call your doctor for medical advice about side effects. You may report side effects to FDA at 1-800-FDA-1088.  Where should I keep my medicine?  This medicine is only given in a clinic, doctor's office, or other health care setting and will not be stored at home.  NOTE: This sheet is a summary. It may not cover all possible information. If you have questions about this medicine, talk to your doctor, pharmacist, or health care provider.      2016, Elsevier/Gold Standard. (2011-07-12 12:37:47)

## 2015-10-13 NOTE — Telephone Encounter (Signed)
Gave patient avs report and appointments for March 2018.  °

## 2015-10-13 NOTE — Progress Notes (Signed)
[facility] Telephone:([phone]) [phone]   Fax:([phone]  OFFICE PROGRESS NOTE  [name], MD [name] [address]  DIAGNOSIS:  1) Node-negative breast cancer status post completion of radiation 05/06/2010 currently on tamoxifen as well as prolia.  2) Monoclonal gammopathy of unknown significance on observation since 2007   PRIOR THERAPY:  1) status post right lumpectomy on 03/05/2010 and it showed invasive ductal carcinoma 0.9 CM, Positive for ER/PR and negative for HER-2, with ductal carcinoma in situ present and negative sentinel lymph node biopsies.  2) status post adjuvant radiotherapy completed in April of 2012   CURRENT THERAPY:  1) tamoxifen 20 mg by mouth daily started in 2012.  2) Prolia subcutaneous injection every 6 months.  INTERVAL HISTORY: [name] 75 y.o. female returns to the clinic today for 6 months followup visit. The patient has no significant complaints today except for right shoulder pain. She is currently undergoing treatment for Parkinson's disease. She is followed by neurology. She fell down yesterday but no significant trauma. She is tolerating her treatment with tamoxifen fairly well. She denied having any significant weight loss or night sweats. The patient has no nausea or vomiting. She denied having any significant chest pain, shortness of breath, cough or hemoptysis. She had repeat bloodwork performed recently and she is here for evaluation and discussion of her lab results.  MEDICAL HISTORY: Past Medical History:  Diagnosis Date  . Allergy    SEASONAL  . Anemia    Anemia-NOS / PMH, Dr Julien Nordmann  . Arthritis   . Breast cancer ([place])    stage I right breast  . Cataract    BILATERAL  . Decreased vision    R eye  . Esophageal Stricture   . Hyperlipidemia   . Monoclonal gammopathy   . MVP (mitral valve prolapse)   . Osteoporosis   . Skin cancer    basal cell L neck  . Vertical diplopia      ALLERGIES:  is allergic to penicillins.  MEDICATIONS:  Current Outpatient Prescriptions  Medication Sig Dispense Refill  . aspirin 81 MG tablet Take 81 mg by mouth daily.      [name] b complex vitamins tablet Take 1 tablet by mouth daily.    . Calcium 1500 MG tablet Take 1,500 mg by mouth.      . carbidopa-levodopa (SINEMET IR) 25-100 MG tablet Take 1 tablet by mouth 3 (three) times daily. 90 tablet 5  . cholecalciferol (VITAMIN D) 1000 UNITS tablet Take 5,000 Units by mouth daily.      . Denosumab (PROLIA Cora) Inject into the skin every 6 (six) months.    . diazepam (VALIUM) 5 MG tablet Take 5 mg by mouth as directed.    . fluticasone (FLONASE) 50 MCG/ACT nasal spray Place 2 sprays into both nostrils daily. 16 g 1  . HYDROcodone-acetaminophen (NORCO/VICODIN) 5-325 MG tablet Take 0.5-1 tablets by mouth every 6 (six) hours as needed for moderate pain (sedation caution). 30 tablet 0  . ibuprofen (ADVIL,MOTRIN) 800 MG tablet Take 1 tablet (800 mg total) by mouth 3 (three) times daily. 21 tablet 0  . Multiple Vitamin (MULTIVITAMIN) capsule 2 tabs po qd     . naproxen (NAPROSYN) 500 MG tablet Take 500 mg by mouth as directed.    . nitroGLYCERIN (NITROSTAT) 0.3 MG SL tablet Place 1 tablet (0.3 mg total) under the tongue every 5 (five) minutes as needed (max 3 doses). Reported  on 04/07/2015 25 tablet 3  . Omega-3 Fatty Acids (OMEGA 3 PO) 1 tab po qd     . omeprazole (PRILOSEC OTC) 20 MG tablet Take 1 tablet (20 mg total) by mouth daily. 90 tablet 1  . oxyCODONE-acetaminophen (PERCOCET/ROXICET) 5-325 MG tablet Take 1 tablet by mouth as directed.    . tamoxifen (NOLVADEX) 20 MG tablet TAKE ONE TABLET BY MOUTH ONCE DAILY 30 tablet 2  . traMADol (ULTRAM) 50 MG tablet TAKE ONE TO TWO TABLETS BY MOUTH AT BEDTIME AS NEEDED FOR PAIN 60 tablet 5  . venlafaxine XR (EFFEXOR-XR) 75 MG 24 hr capsule TAKE ONE CAPSULE BY MOUTH ONCE DAILY WITH BREAKFAST 30 capsule 5  . vitamin B-12 (CYANOCOBALAMIN) 500 MCG tablet  Take 2500 mcg daily.     No current facility-administered medications for this visit.     SURGICAL HISTORY:  Past Surgical History:  Procedure Laterality Date  . APPENDECTOMY  2002  . BONE BIOPSY  11/06/10  . BREAST LUMPECTOMY  02/2010   right breast lumpectomy and sentinel node biopsy  . CARDIAC CATHETERIZATION  2005   neg  . COLONOSCOPY     negative   . HEMORRHOID SURGERY  1970's  . SALPINGOOPHORECTOMY  2002   for benign growths  . TOTAL ABDOMINAL HYSTERECTOMY W/ BILATERAL SALPINGOOPHORECTOMY  75 yrs old   for pain,prolapse ; G3 P2  . UPPER GASTROINTESTINAL ENDOSCOPY      REVIEW OF SYSTEMS:  A comprehensive review of systems was negative except for: Musculoskeletal: positive for arthralgias and muscle weakness   PHYSICAL EXAMINATION: General appearance: alert, cooperative and no distress Head: Normocephalic, without obvious abnormality, atraumatic Neck: no adenopathy, no JVD, supple, symmetrical, trachea midline and thyroid not enlarged, symmetric, no tenderness/mass/nodules Lymph nodes: Cervical, supraclavicular, and axillary nodes normal. Resp: clear to auscultation bilaterally Cardio: regular rate and rhythm, S1, S2 normal, no murmur, click, rub or gallop GI: soft, non-tender; bowel sounds normal; no masses,  no organomegaly Extremities: extremities normal, atraumatic, no cyanosis or edema   ECOG PERFORMANCE STATUS: 1 - Symptomatic but completely ambulatory  Blood pressure 126/62, pulse 80, temperature 98.3 F (36.8 C), temperature source Oral, resp. rate 18, height 5' 5"  (1.651 m), weight 126 lb 1.6 oz (57.2 kg), SpO2 94 %.  LABORATORY DATA: Lab Results  Component Value Date   WBC 5.5 10/13/2015   HGB 11.6 10/13/2015   HCT 35.4 10/13/2015   MCV 98.9 10/13/2015   PLT 212 10/13/2015      Chemistry      Component Value Date/Time   NA 143 10/10/2015 1132   NA 142 03/31/2015 1054   K 4.2 10/10/2015 1132   K 3.6 03/31/2015 1054   CL 105 10/10/2015 1132   CL  107 03/21/2012 1501   CO2 31 10/10/2015 1132   CO2 24 03/31/2015 1054   BUN 16 10/10/2015 1132   BUN 14.8 03/31/2015 1054   CREATININE 0.74 10/10/2015 1132   CREATININE 0.8 03/31/2015 1054      Component Value Date/Time   CALCIUM 8.6 10/10/2015 1132   CALCIUM 8.2 (L) 03/31/2015 1054   ALKPHOS 42 10/10/2015 1132   ALKPHOS 36 (L) 03/31/2015 1054   AST 30 10/10/2015 1132   AST 26 03/31/2015 1054   ALT 24 10/10/2015 1132   ALT 18 03/31/2015 1054   BILITOT 0.4 10/10/2015 1132   BILITOT <0.30 03/31/2015 1054       RADIOGRAPHIC STUDIES: No results found.  ASSESSMENT AND PLAN: This is a very pleasant 75 years  old white female with history of monoclonal gammopathy of undetermined significance and has been observation since 2007 in addition to diagnosis of node-negative breast cancer in 2012 and currently on treatment with tamoxifen as well as Prolia every 6 months.  The patient is doing fine and her blood work today is unremarkable. For the MGUS, I recommended for her to continue on observation with repeat myeloma panel in 6 months. For the breast cancer, her tumor marker is within the normal range. I recommended for her to continue her treatment with tamoxifen as the patient is tolerating it well. She would have repeat CA 27.29 in 6 months. The patient will continue with her routine screening mammogram as a scheduled. For the weakness and Parkinson's disease, she will continue follow-up visit with her neurologist. She was advised to call immediately if she has any concerning symptoms in the interval.  The patient voices understanding of current disease status and treatment options and is in agreement with the current care plan.  All questions were answered. The patient knows to call the clinic with any problems, questions or concerns. We can certainly see the patient much sooner if necessary.  Disclaimer: This note was dictated with voice recognition software. Similar sounding words can  inadvertently be transcribed and may not be corrected upon review.

## 2015-10-14 ENCOUNTER — Telehealth: Payer: Self-pay | Admitting: Family Medicine

## 2015-10-14 LAB — CANCER ANTIGEN 27-29 (PARALLEL TESTING): CA 27.29: 12 U/mL (ref <38)

## 2015-10-14 LAB — CANCER ANTIGEN 27.29: CA 27.29: 14.5 U/mL (ref 0.0–38.6)

## 2015-10-14 NOTE — Telephone Encounter (Signed)
Patient Name: [name]  Gender: Female  DOB: 08-11-1940   Age: 75 Y 48 D  Return Phone Number: [phone] (Primary)  Address:   City/State/Zip: [place]    Client [facility] - Client  Client Site [facility] - Day  Physician Renford Dills - MD  Contact Type Call  Who Is Calling Patient / Member / Family / Caregiver  Call Type Triage / Clinical  Relationship To Patient Self  Return Phone Number [phone] (Primary)  Chief Complaint HEAD INJURY - and not acting right. Change in behaviour after hitting head.  Reason for Call Symptomatic / Request for Health Information  Initial Comment Caller states fell Sun night, hit her head and face, having headaches  Appointment Disposition EMR Appointment Scheduled  Info pasted into Epic Yes  PreDisposition Call Doctor  Translation No   Nurse Assessment  Nurse: Julien Girt, RN, Almyra Free Date/Time Eilene Ghazi Time): 10/14/2015 11:20:02 AM  Confirm and document reason for call. If symptomatic, describe symptoms. You must click the next button to save text entered. ---Caller states she fell Sunday night, became off balance and hit her head on the floor. She hit her head and left side of her face. She has been having headaches. Caller is driving today.   Has the patient traveled out of the country within the last 30 days? ---Not Applicable  Does the patient have any new or worsening symptoms? ---Yes  Will a triage be completed? ---Yes  Related visit to physician within the last 2 weeks? ---No  Does the PT have any chronic conditions? (i.e. diabetes, asthma, etc.) ---Yes  List chronic conditions. ---Rotator cuff repair/Rt/August 12, 2015, Parkinson's disease, GERD, Breast CA survivor  Is this a behavioral health or substance abuse call? ---No     Guidelines      Guideline Title Affirmed Question Affirmed Notes Nurse Date/Time (Eastern Time)  Head Injury [1] After 72 hours AND [2] headache persists  Chancy Hurter 10/14/2015 11:23:58 AM   Disp. Time Eilene Ghazi Time) Disposition Final User   10/14/2015 11:17:58 AM Send to Urgent Queue  Jearld Shines   10/14/2015 11:39:48 AM Send To RN Personal  Julien Girt, RN, Almyra Free    10/14/2015 11:27:58 AM See PCP When Office is Open (within 3 days) Yes Julien Girt, RN, Dagoberto Reef Understands: Yes  Disagree/Comply: Comply     Care Advice Given Per Guideline      SEE PCP WITHIN 3 DAYS: * For pain relief, take acetaminophen, ibuprofen, or naproxen. * You become worse. CARE ADVICE given per Head Injury (Adult) guideline.   Comments  User: Eloisa Northern, RN Date/Time Eilene Ghazi Time): 10/14/2015 11:31:48 AM  Caller states she made an appointment with her Cardiologist for tomorrow at 9 am, prior to this call, in case she could not get in to be seen today. She verbalized understanding of all instructions and information and will call back as needed.   Referrals  GO TO FACILITY OTHER - SPECIFY

## 2015-10-14 NOTE — Telephone Encounter (Signed)
Pt has appt with Dr [name] for med wellness on 10/17/15.

## 2015-10-14 NOTE — Telephone Encounter (Signed)
Does she have focal changes or is she only locally sore at the spot where she made contact on the fall?   Please get some extra details to see about when she needs eval.  Thanks.

## 2015-10-14 NOTE — Progress Notes (Signed)
[name] was seen today in the movement disorders clinic for neurologic consultation at the request of [name], MD.  The consultation is for the evaluation of tremor.  The records that were made available to me were reviewed.  No one accompanies the patient.   The first symptom(s) the patient noticed was unilateral hand tremor on the L and this was 4-5 months ago.  She is R hand dominant.  She states that it was intermittent when it started but now it is more constant.  No tremor elsewhere.    Only living relative with PD is a cousin, who is currently being treated at [facility].  08/17/13 update:  Pt states that tremor is improved on levodopa but not sure if it is otherwise helping (but may be).  Takes med at 8am/1pm/5-7pm.  No SE.  Blood pressure has been low but states that it is always low.  No lightheadedness.  Balance is not good, but no falls.  She completed therapy.  Is exercising at home.  She walks an hr a day 6 days per week and when she goes to the gym, she will use the treadmill.  Has signed up for PD exercise class.   11/20/13 update:  Pt f/u re: PD.  Is on carbidopa/levodopa 25/100 tid.  Is doing well.  No falls.  No lightheadedness.  No hallucinations.  Exercising faithfully.   Is having an aching pain in the shoulder and in the thumb region on the R side but PD primarily affects her on the L.  Some occasional tremor on the L.    04/10/14 update:  Pt is on carbidopa/levodopa 25/100 tid.  She is stable in regards to PD but unfortunately her husband just died 72 days ago.  She just started back exercising with her sister in law.  No SI/HI.  No falls.  No lightheadeness.  Some tremor but no more than usual.  08/12/14 update:  Pt is on carbidopa/levodopa 25/100 three times per day.  She has done PT/OT/ST since our last visit and just completed her last therapy session on 08/01/14.  She had an injection in her shoulder as well and then had therapy for that and it is feeling better.   No falls since our last visit.  No lightheadedness.  She is exercising faithfully.  She is walking for exercise 6 times a week.  She admits that she is crying a lot and states that it started before her husband died and sometimes it is crying over inconsequential stuff.  Sometimes, she cries over her husbands death but doesn't want medication for that.  She is not SI/HI.  12/16/14 update:  Pt is following up today and is still on carbidopa/levodopa 25/100 tid.   No significant changes since last visit.  She goes to the gym 6 days a week.  Sometimes, she will go two times a day.   She is going to be spending thanksgiving with her son.    Wearing off:  No.  How long before next dose:  n/a Falls:   No. N/V:  No. (had some nausea this past weekend but thinks related to food she ate) Hallucinations:  No.  visual distortions: No. Lightheaded:  Yes.  , just last few days  Syncope: No. Dyskinesia:  No.   04/15/15 update:  The patient presents today for follow-up.  She has a history of Parkinson's disease.  She is currently on carbidopa/levodopa 25/100, 3 times per day.  Overall, she reports  that she is doing well.  She denies any falls.  No hallucinations.  No lightheadedness or near syncope.  She is still exercising.  She started occupational therapy for her shoulder pain.  I saw that speech therapy recommended that she see GI because she had her esophagus structure in the past.  She has an appointment with [name] on March 30.  She states that she is having a little problem swallowing but "it isn't bad yet."  States that she is crying for unknown reason (watching the news, etc).  Doesn't think that it is depression.  09/09/15 update:  The patient follows up today.  I have reviewed multiple records since her last visit.  She remains on carbidopa/levodopa 25/100, one tablet 3 times per day.  Last visit, I was going to start her on Nuedexta, but it turns out that this interfered with her tamoxifen.  We ended  up starting her on Effexor XR, 75 mg daily.  The patient states that the crying spells resolved with the effexor.  She did have a modified barium swallow on 04/21/2015 that was normal.  She continued to have a globus sensation and saw [name] for this.  He felt that she had mild oropharyngeal dysphagia related to Parkinson's, but that was not demonstrated on her barium swallow.  He did do a CT of the abdomen that was negative.  The patient did go to the emergency room on 05/07/2015 with headache.  That has since resolved.  She has also been to Dr. [name] with lightheadedness, and also described vertigo to the physical therapist and they recommended vestibular rehabilitation.  She has been going to rehabilitation therapy.   That did help.  She has not had any falls.  No syncopal episodes.  No falls.  She does tell me she had surgery on 7/18 by [name] for rotator cuff.  She is still in a sling.    10/15/15 update:  The patient follows up today.  She is on carbidopa/levodopa 25/100, one tablet 3 times per day.  She is also on Effexor XR, 75 mg daily.  She feels that mood has been good.  She has significant orthostatic hypotension, but I was leery about giving her medication for this last visit because she was complaining about a lot of chest pain.  Not long after that, she saw [name], who ordered an echocardiogram which was performed on 09/23/2015 and was unremarkable.  Ejection fraction was 55-60%.  She had a nuclear stress test and there was no significant reversible ischemia. LVEF 67% with normal wall motion on the stress Myoview.  She did fall this past Sunday and hit her head.  She was putting on underwear and tripped and her face hit the floor.  Everytime she chews, her L face and jaw hurt.  She also has a bilateral frontal headache.  "It feels like a regular headache like I have had before."   Was told that she couldn't do therapy yesterday until she saw her doctor.  L shoulder is a bit sore.  Admits  that she hasn't been drinking quite as much water.   Neuroimaging has not previously been performed.  It was supposed to be done 06/01/13 but pt cancelled as she was claustrophobic and couldn't do the scan even though it was in an open unit.   PREVIOUS MEDICATIONS: none to date  ALLERGIES:   Allergies  Allergen Reactions  . Penicillins Shortness Of Breath and Itching    CURRENT MEDICATIONS:  Current Outpatient Prescriptions on File Prior to Visit  Medication Sig Dispense Refill  . aspirin 81 MG tablet Take 81 mg by mouth daily.      [name] b complex vitamins tablet Take 1 tablet by mouth daily.    . Calcium 1500 MG tablet Take 1,500 mg by mouth.      . carbidopa-levodopa (SINEMET IR) 25-100 MG tablet Take 1 tablet by mouth 3 (three) times daily. 90 tablet 5  . cholecalciferol (VITAMIN D) 1000 UNITS tablet Take 5,000 Units by mouth daily.      . Denosumab (PROLIA [place]) Inject into the skin every 6 (six) months.    . fluticasone (FLONASE) 50 MCG/ACT nasal spray Place 2 sprays into both nostrils daily. 16 g 1  . Multiple Vitamin (MULTIVITAMIN) capsule 2 tabs po qd     . naproxen (NAPROSYN) 500 MG tablet Take 500 mg by mouth as directed.    . nitroGLYCERIN (NITROSTAT) 0.3 MG SL tablet Place 1 tablet (0.3 mg total) under the tongue every 5 (five) minutes as needed (max 3 doses). Reported on 04/07/2015 25 tablet 3  . Omega-3 Fatty Acids (OMEGA 3 PO) 1 tab po qd     . omeprazole (PRILOSEC OTC) 20 MG tablet Take 1 tablet (20 mg total) by mouth daily. 90 tablet 1  . tamoxifen (NOLVADEX) 20 MG tablet TAKE ONE TABLET BY MOUTH ONCE DAILY 30 tablet 2  . traMADol (ULTRAM) 50 MG tablet TAKE ONE TO TWO TABLETS BY MOUTH AT BEDTIME AS NEEDED FOR PAIN 60 tablet 5  . venlafaxine XR (EFFEXOR-XR) 75 MG 24 hr capsule TAKE ONE CAPSULE BY MOUTH ONCE DAILY WITH BREAKFAST 30 capsule 5  . vitamin B-12 (CYANOCOBALAMIN) 500 MCG tablet Take 2500 mcg daily.     No current facility-administered medications on file prior to  visit.     PAST MEDICAL HISTORY:   Past Medical History:  Diagnosis Date  . Allergy    SEASONAL  . Anemia    Anemia-NOS / PMH, Dr Julien Nordmann  . Arthritis   . Breast cancer ([place])    stage I right breast  . Cataract    BILATERAL  . Decreased vision    R eye  . Esophageal Stricture   . Hyperlipidemia   . Monoclonal gammopathy   . MVP (mitral valve prolapse)   . Osteoporosis   . Skin cancer    basal cell L neck  . Vertical diplopia     PAST SURGICAL HISTORY:   Past Surgical History:  Procedure Laterality Date  . APPENDECTOMY  2002  . BONE BIOPSY  11/06/10  . BREAST LUMPECTOMY  02/2010   right breast lumpectomy and sentinel node biopsy  . CARDIAC CATHETERIZATION  2005   neg  . COLONOSCOPY     negative   . HEMORRHOID SURGERY  1970's  . SALPINGOOPHORECTOMY  2002   for benign growths  . TOTAL ABDOMINAL HYSTERECTOMY W/ BILATERAL SALPINGOOPHORECTOMY  75 yrs old   for pain,prolapse ; G3 P2  . UPPER GASTROINTESTINAL ENDOSCOPY      SOCIAL HISTORY:   Social History   Social History  . Marital status: Widowed    Spouse name: N/A  . Number of children: 2  . Years of education: N/A   Occupational History  . Retired    Social History Main Topics  . Smoking status: Never Smoker  . Smokeless tobacco: Never Used  . Alcohol use No  . Drug use: No  . Sexual activity: Not on file  Other Topics Concern  . Not on file   Social History Narrative   Occupation: Textile   Widowed 2016, husband had cancer, was married in 1959    Patient has never smoked.    Alcohol Use - no   Illicit Drug Use - no   Patient does not get regular exercise.    Daily Caffeine Use: 3 cups of coffee daily    FAMILY HISTORY:   Family Status  Relation Status  . Mother Deceased   heart disease  . Brother Alive   diabetes  . Brother Deceased   heart disease, renal failure  . Father Deceased   hepatitis  . Sister Alive   diabetes  . Brother Deceased   drowned as a teenager  . Brother  Alive   diabetes  . Brother Alive   diabetes  . Sister Alive   diabetes, liver disease (alcohol)  . Sister Alive   healthy  . Son Alive   sarcoidosis  . Daughter Alive   healthy  . Cousin Alive   Parkinsons disease  . Neg Hx     ROS:  A complete 10 system review of systems was obtained and was unremarkable apart from what is mentioned above.  PHYSICAL EXAMINATION:    VITALS:   Vitals:   10/15/15 0847  Weight: 125 lb (56.7 kg)  Height: 5\' 5"  (1.651 m)   Orthostatic VS for the past 24 hrs (Last 3 readings):  BP- Lying Pulse- Lying BP- Sitting Pulse- Sitting BP- Standing at 0 minutes Pulse- Standing at 0 minutes  10/15/15 0849 138/70 85 126/66 89 120/70 94    GEN:  The patient appears stated age and is in NAD.   HEENT:  Normocephalic.  She has ecchymosis on the L cheek (covered with makeup).    The mucous membranes are moist. The superficial temporal arteries are without ropiness or tenderness. CV:  RRR Lungs:  CTAB Neck:  No bruits   Neurological examination:  Orientation: The patient is alert and oriented x3.  Cranial nerves: There is good facial symmetry. There is facial hypomimia. The speech is fluent and clear. Soft palate rises symmetrically and there is no tongue deviation. Hearing is intact to conversational tone. Sensation: Sensation is intact to light touch throughout Motor: Strength is 5/5 in the bilateral upper and lower extremities.   Shoulder shrug is equal and symmetric.  There is no pronator drift. Deep tendon reflexes: Deep tendon reflexes are 3/4 at the bilateral biceps, triceps, brachioradialis, patella and achilles. Plantar responses are downgoing bilaterally.  Movement examination: Tone: There is normal tone in the upper extremity Abnormal movements: There is no tremor today Coordination:  There is good RAM's today in the LUE and bilateral LE and good finger taps on the right (otherwise R arm in sling) Gait and Station: The patient has slight  difficulty getting OOC without hands.  Is more tenuous with walking.  Drags R leg a little with ambulation.  Decreased R arm swing.  ASSESSMENT/PLAN:  1.  Idiopathic Parkinson's disease.  She was dx in May, 2015.    -She is doing well on carbidopa/levodopa 25/100 tid.  RF today.  May need increased dose in future but think slower today because of fall 2.  S/p fall with facial and head trauma  -CT brain and maxillofacial region today  -hold PT until those are clear.  If okay, may resume 3.  Depression  -continue effexor.  Crying spells resolved with this this small dose.  Risks, benefits, side effects and alternative therapies were discussed.  The opportunity to ask questions was given and they were answered to the best of my ability.  The patient expressed understanding and willingness to follow the outlined treatment protocols.  Samples given.   4.  Dysphagia  -MBE on 04/21/15 was normal  -[name] did not think this was GI issue. 5.  Orthostatic hypotension  -Pt states that she was drinking 60 oz of water per day but backed down a little.  Told her to resume that  -Talked to her about Northera but orthostatics today are much better than in the past and worry about creating supine HTN.  Gave RX for abdominal compression binder and wear all day and remove at night. 6.  Follow up is anticipated in the next 3-4 weeks, sooner should new neuro issues arise.  Time in the room with the patient was 25 min with greater than 50% in counseling/coordinating care.

## 2015-10-14 NOTE — Telephone Encounter (Signed)
Noted.  Appreciate neuro help.  I'm out of clinic tomorrow AM.

## 2015-10-14 NOTE — Telephone Encounter (Signed)
Patient says she is sore mostly at the spot where she hit the floor but she has had a general headache since.  However, she hasn't had to take anything for the HA today.  She says she can see Dr. Carles Collet tomorrow morning because she called her office as well as ours and Dr. Carles Collet can see her sooner.

## 2015-10-15 ENCOUNTER — Ambulatory Visit (INDEPENDENT_AMBULATORY_CARE_PROVIDER_SITE_OTHER): Payer: Medicare Other | Admitting: Neurology

## 2015-10-15 ENCOUNTER — Ambulatory Visit
Admission: RE | Admit: 2015-10-15 | Discharge: 2015-10-15 | Disposition: A | Discharge status: Home / Self Care | Payer: Medicare Other | Source: Ambulatory Visit | Attending: Neurology | Admitting: Neurology

## 2015-10-15 ENCOUNTER — Encounter: Payer: Self-pay | Admitting: Neurology

## 2015-10-15 ENCOUNTER — Telehealth: Payer: Self-pay | Admitting: Neurology

## 2015-10-15 VITALS — Ht 65.0 in | Wt 125.0 lb

## 2015-10-15 DIAGNOSIS — G20 Parkinson's disease: Secondary | ICD-10-CM | POA: Diagnosis not present

## 2015-10-15 DIAGNOSIS — G903 Multi-system degeneration of the autonomic nervous system: Secondary | ICD-10-CM

## 2015-10-15 DIAGNOSIS — S0990XA Unspecified injury of head, initial encounter: Secondary | ICD-10-CM | POA: Diagnosis not present

## 2015-10-15 DIAGNOSIS — S0993XA Unspecified injury of face, initial encounter: Secondary | ICD-10-CM | POA: Diagnosis not present

## 2015-10-15 DIAGNOSIS — W19XXXA Unspecified fall, initial encounter: Secondary | ICD-10-CM

## 2015-10-15 MED ORDER — ABDOMINAL BINDER/ELASTIC SMALL MISC: 1.0000 | Freq: Every day | 0 refills | Status: DC

## 2015-10-15 MED ORDER — CARBIDOPA-LEVODOPA 25-100 MG PO TABS: 1.0000 | ORAL_TABLET | Freq: Three times a day (TID) | ORAL | 5 refills | Status: DC

## 2015-10-15 NOTE — Telephone Encounter (Signed)
Her # is [phone] She was returning your call. Thank you

## 2015-10-15 NOTE — Telephone Encounter (Signed)
Did you need me to do something else Luvenia Starch?  Was back in my inbox

## 2015-10-15 NOTE — Telephone Encounter (Signed)
[facility] called report - all test were negative - reports are in Epic.

## 2015-10-15 NOTE — Telephone Encounter (Signed)
From Dr. Carles Collet: Reviewed. Mild atrophy. [name], let pt know that CT brain and face looked okay. She may want to wait few days for soreness to retreat but can get back to PT when she is ready      Left message on machine for patient to call back.

## 2015-10-15 NOTE — Telephone Encounter (Signed)
Patient made aware of results.  

## 2015-10-15 NOTE — Telephone Encounter (Signed)
PT returned your call/Dawn °

## 2015-10-15 NOTE — Telephone Encounter (Signed)
Can you look at scans and advise.

## 2015-10-17 ENCOUNTER — Ambulatory Visit (INDEPENDENT_AMBULATORY_CARE_PROVIDER_SITE_OTHER): Payer: Medicare Other | Admitting: Family Medicine

## 2015-10-17 ENCOUNTER — Encounter: Payer: Self-pay | Admitting: Family Medicine

## 2015-10-17 DIAGNOSIS — G20 Parkinson's disease: Secondary | ICD-10-CM | POA: Diagnosis not present

## 2015-10-17 DIAGNOSIS — K219 Gastro-esophageal reflux disease without esophagitis: Secondary | ICD-10-CM

## 2015-10-17 DIAGNOSIS — M810 Age-related osteoporosis without current pathological fracture: Secondary | ICD-10-CM | POA: Diagnosis not present

## 2015-10-17 DIAGNOSIS — Z0000 Encounter for general adult medical examination without abnormal findings: Secondary | ICD-10-CM

## 2015-10-17 MED ORDER — OMEPRAZOLE MAGNESIUM 20 MG PO TBEC: 20.0000 mg | DELAYED_RELEASE_TABLET | Freq: Every day | ORAL | 3 refills | Status: DC

## 2015-10-17 NOTE — Progress Notes (Signed)
Pre visit review using our clinic review tool, if applicable. No additional management support is needed unless otherwise documented below in the visit note. 

## 2015-10-17 NOTE — Patient Instructions (Signed)
Try taking the tramadol in the day if needed for pain.  Take care.  Glad to see you.

## 2015-10-17 NOTE — Progress Notes (Signed)
I have personally reviewed the Medicare Annual Wellness questionnaire and have noted 1. The patient's medical and social history 2. Their use of alcohol, tobacco or illicit drugs 3. Their current medications and supplements 4. The patient's functional ability including ADL's, fall risks, home safety risks and hearing or visual             impairment. 5. Diet and physical activities 6. Evidence for depression or mood disorders  The patients weight, height, BMI have been recorded in the chart and visual acuity is per eye clinic.  I have made referrals, counseling and provided education to the patient based review of the above and I have provided the pt with a written personalized care plan for preventive services.  Provider list updated- see scanned forms.  Routine anticipatory guidance given to patient.  See health maintenance.  Flu 2017 Shingles 2014 PNA 2013 Tetanus 2011 Colonoscopy 2011 Breast cancer screening 2017 DXA done 2014-She has prolia through oncology.  Cognitive function addressed- see scanned forms- and if abnormal then additional documentation follows.   GERD controlled with PPI.  No ADE on med.   Parkinson's per Dr. Carles Collet.  [place] recently, hit L side of face.  She was getting dressed when she fell. D/w pt about cautions.  Shoulder and hips are still sore.  Still with some L facial pain after a fall, but getting better slowly.  She had neg CT in the meantime, no fx seen.  D/w pt.   PMH and SH reviewed  Meds, vitals, and allergies reviewed.   ROS: Per HPI.  Unless specifically indicated otherwise in HPI, the patient denies:  General: fever. Eyes: acute vision changes ENT: sore throat Cardiovascular: chest pain Respiratory: SOB GI: vomiting GU: dysuria Musculoskeletal: acute back pain Derm: acute rash Neuro: acute motor dysfunction Psych: worsening mood Endocrine: polydipsia Heme: bleeding Allergy: hayfever  GEN: nad, alert and oriented HEENT: mucous  membranes moist, L side of face near maxilla ttp mildly but not bruised.   NECK: supple w/o LA CV: rrr. PULM: ctab, no inc wob ABD: soft, +bs EXT: no edema SKIN: no acute rash Some pain with B shoulder ROM, esp external rotation but no arm drop.

## 2015-10-18 NOTE — Assessment & Plan Note (Signed)
Per neurology. Appreciate help from Dr. Carles Collet. Discussed with patient about fall cautions. I gave her information to get a fall alert button she could have at home. We talked about risk reduction for falls. I don't think she has an acute injury to either shoulder. Her CT head was negative for fracture. She will resume her PCP. She'll update me as needed.

## 2015-10-18 NOTE — Assessment & Plan Note (Signed)
Controlled with current medication. No adverse effect. Continue as is. She agrees.

## 2015-10-18 NOTE — Assessment & Plan Note (Signed)
On prolia per oncology.

## 2015-10-18 NOTE — Assessment & Plan Note (Signed)
Flu 2017 Shingles 2014 PNA 2013 Tetanus 2011 Colonoscopy 2011 Breast cancer screening 2017 DXA done 2014-She has prolia through oncology.  Cognitive function addressed- see scanned forms- and if abnormal then additional documentation follows.

## 2015-10-21 DIAGNOSIS — M25611 Stiffness of right shoulder, not elsewhere classified: Secondary | ICD-10-CM | POA: Diagnosis not present

## 2015-10-22 DIAGNOSIS — Z4789 Encounter for other orthopedic aftercare: Secondary | ICD-10-CM | POA: Diagnosis not present

## 2015-10-23 DIAGNOSIS — M25611 Stiffness of right shoulder, not elsewhere classified: Secondary | ICD-10-CM | POA: Diagnosis not present

## 2015-10-25 ENCOUNTER — Other Ambulatory Visit: Payer: Self-pay | Admitting: Internal Medicine

## 2015-10-25 DIAGNOSIS — C50919 Malignant neoplasm of unspecified site of unspecified female breast: Secondary | ICD-10-CM

## 2015-10-31 DIAGNOSIS — M25611 Stiffness of right shoulder, not elsewhere classified: Secondary | ICD-10-CM | POA: Diagnosis not present

## 2015-11-03 DIAGNOSIS — M25611 Stiffness of right shoulder, not elsewhere classified: Secondary | ICD-10-CM | POA: Diagnosis not present

## 2015-11-06 DIAGNOSIS — M25611 Stiffness of right shoulder, not elsewhere classified: Secondary | ICD-10-CM | POA: Diagnosis not present

## 2015-11-10 DIAGNOSIS — M25611 Stiffness of right shoulder, not elsewhere classified: Secondary | ICD-10-CM | POA: Diagnosis not present

## 2015-11-12 DIAGNOSIS — M25611 Stiffness of right shoulder, not elsewhere classified: Secondary | ICD-10-CM | POA: Diagnosis not present

## 2015-11-14 ENCOUNTER — Ambulatory Visit: Payer: Medicare Other | Admitting: Neurology

## 2015-11-14 NOTE — Progress Notes (Signed)
[name] was seen today in the movement disorders clinic for neurologic consultation at the request of [name], MD.  The consultation is for the evaluation of tremor.  The records that were made available to me were reviewed.  No one accompanies the patient.   The first symptom(s) the patient noticed was unilateral hand tremor on the L and this was 4-5 months ago.  She is R hand dominant.  She states that it was intermittent when it started but now it is more constant.  No tremor elsewhere.    Only living relative with PD is a cousin, who is currently being treated at [facility].  08/17/13 update:  Pt states that tremor is improved on levodopa but not sure if it is otherwise helping (but may be).  Takes med at 8am/1pm/5-7pm.  No SE.  Blood pressure has been low but states that it is always low.  No lightheadedness.  Balance is not good, but no falls.  She completed therapy.  Is exercising at home.  She walks an hr a day 6 days per week and when she goes to the gym, she will use the treadmill.  Has signed up for PD exercise class.   11/20/13 update:  Pt f/u re: PD.  Is on carbidopa/levodopa 25/100 tid.  Is doing well.  No falls.  No lightheadedness.  No hallucinations.  Exercising faithfully.   Is having an aching pain in the shoulder and in the thumb region on the R side but PD primarily affects her on the L.  Some occasional tremor on the L.    04/10/14 update:  Pt is on carbidopa/levodopa 25/100 tid.  She is stable in regards to PD but unfortunately her husband just died 72 days ago.  She just started back exercising with her sister in law.  No SI/HI.  No falls.  No lightheadeness.  Some tremor but no more than usual.  08/12/14 update:  Pt is on carbidopa/levodopa 25/100 three times per day.  She has done PT/OT/ST since our last visit and just completed her last therapy session on 08/01/14.  She had an injection in her shoulder as well and then had therapy for that and it is feeling better.   No falls since our last visit.  No lightheadedness.  She is exercising faithfully.  She is walking for exercise 6 times a week.  She admits that she is crying a lot and states that it started before her husband died and sometimes it is crying over inconsequential stuff.  Sometimes, she cries over her husbands death but doesn't want medication for that.  She is not SI/HI.  12/16/14 update:  Pt is following up today and is still on carbidopa/levodopa 25/100 tid.   No significant changes since last visit.  She goes to the gym 6 days a week.  Sometimes, she will go two times a day.   She is going to be spending thanksgiving with her son.    Wearing off:  No.  How long before next dose:  n/a Falls:   No. N/V:  No. (had some nausea this past weekend but thinks related to food she ate) Hallucinations:  No.  visual distortions: No. Lightheaded:  Yes.  , just last few days  Syncope: No. Dyskinesia:  No.   04/15/15 update:  The patient presents today for follow-up.  She has a history of Parkinson's disease.  She is currently on carbidopa/levodopa 25/100, 3 times per day.  Overall, she reports  that she is doing well.  She denies any falls.  No hallucinations.  No lightheadedness or near syncope.  She is still exercising.  She started occupational therapy for her shoulder pain.  I saw that speech therapy recommended that she see GI because she had her esophagus structure in the past.  She has an appointment with [name] on March 30.  She states that she is having a little problem swallowing but "it isn't bad yet."  States that she is crying for unknown reason (watching the news, etc).  Doesn't think that it is depression.  09/09/15 update:  The patient follows up today.  I have reviewed multiple records since her last visit.  She remains on carbidopa/levodopa 25/100, one tablet 3 times per day.  Last visit, I was going to start her on Nuedexta, but it turns out that this interfered with her tamoxifen.  We ended  up starting her on Effexor XR, 75 mg daily.  The patient states that the crying spells resolved with the effexor.  She did have a modified barium swallow on 04/21/2015 that was normal.  She continued to have a globus sensation and saw [name] for this.  He felt that she had mild oropharyngeal dysphagia related to Parkinson's, but that was not demonstrated on her barium swallow.  He did do a CT of the abdomen that was negative.  The patient did go to the emergency room on 05/07/2015 with headache.  That has since resolved.  She has also been to Dr. [name] with lightheadedness, and also described vertigo to the physical therapist and they recommended vestibular rehabilitation.  She has been going to rehabilitation therapy.   That did help.  She has not had any falls.  No syncopal episodes.  No falls.  She does tell me she had surgery on 7/18 by [name] for rotator cuff.  She is still in a sling.    10/15/15 update:  The patient follows up today.  She is on carbidopa/levodopa 25/100, one tablet 3 times per day.  She is also on Effexor XR, 75 mg daily.  She feels that mood has been good.  She has significant orthostatic hypotension, but I was leery about giving her medication for this last visit because she was complaining about a lot of chest pain.  Not long after that, she saw [name], who ordered an echocardiogram which was performed on 09/23/2015 and was unremarkable.  Ejection fraction was 55-60%.  She had a nuclear stress test and there was no significant reversible ischemia. LVEF 67% with normal wall motion on the stress Myoview.  She did fall this past Sunday and hit her head.  She was putting on underwear and tripped and her face hit the floor.  Everytime she chews, her L face and jaw hurt.  She also has a bilateral frontal headache.  "It feels like a regular headache like I have had before."   Was told that she couldn't do therapy yesterday until she saw her doctor.  L shoulder is a bit sore.  Admits  that she hasn't been drinking quite as much water.  11/18/15 update:  The patient follows up today.  She remains on carbidopa/levodopa 25/100, one tablet 3 times per day.  She did have a CT of the brain and maxillofacial regions right after our last visit, due to a fall.  Fortunately, there was no intracranial abnormalities and no facial fractures.  She has recovered nicely.  She has had no further  falls.  Re: lightheaded, feeling a "little dizzy" with standing.  Thinks dizziness has not been "bad."  Wears abdominal binder some but makes her "hot."    Her mood is doing good on Effexor XR, 75 mg daily.  "I don't cry any more all of the time."  Going to PT and she is walking for exercise.  Is back to drinking about 5-6 bottles water per day.  She has started to note tremor in the RUE but not as much as in the LUE  Neuroimaging has not previously been performed.  It was supposed to be done 06/01/13 but pt cancelled as she was claustrophobic and couldn't do the scan even though it was in an open unit.   PREVIOUS MEDICATIONS: none to date  ALLERGIES:   Allergies  Allergen Reactions  . Penicillins Shortness Of Breath and Itching    CURRENT MEDICATIONS:  Current Outpatient Prescriptions on File Prior to Visit  Medication Sig Dispense Refill  . aspirin 81 MG tablet Take 81 mg by mouth daily.      [name] b complex vitamins tablet Take 1 tablet by mouth daily.    . Calcium 1500 MG tablet Take 1,500 mg by mouth.      . carbidopa-levodopa (SINEMET IR) 25-100 MG tablet Take 1 tablet by mouth 3 (three) times daily. 90 tablet 5  . cholecalciferol (VITAMIN D) 1000 UNITS tablet Take 5,000 Units by mouth daily.      . Denosumab (PROLIA [place]) Inject into the skin every 6 (six) months.    . Elastic Bandages & Supports (ABDOMINAL BINDER/ELASTIC SMALL) MISC 1 Device by Does not apply route daily. 1 each 0  . fluticasone (FLONASE) 50 MCG/ACT nasal spray Place 2 sprays into both nostrils daily. 16 g 1  . Multiple Vitamin  (MULTIVITAMIN) capsule 2 tabs po qd     . naproxen (NAPROSYN) 500 MG tablet Take 500 mg by mouth as directed.    . nitroGLYCERIN (NITROSTAT) 0.3 MG SL tablet Place 1 tablet (0.3 mg total) under the tongue every 5 (five) minutes as needed (max 3 doses). Reported on 04/07/2015 25 tablet 3  . Omega-3 Fatty Acids (OMEGA 3 PO) 1 tab po qd     . omeprazole (PRILOSEC OTC) 20 MG tablet Take 1 tablet (20 mg total) by mouth daily. 90 tablet 3  . tamoxifen (NOLVADEX) 20 MG tablet TAKE ONE TABLET BY MOUTH ONCE DAILY 30 tablet 2  . traMADol (ULTRAM) 50 MG tablet TAKE ONE TO TWO TABLETS BY MOUTH AT BEDTIME AS NEEDED FOR PAIN 60 tablet 5  . venlafaxine XR (EFFEXOR-XR) 75 MG 24 hr capsule TAKE ONE CAPSULE BY MOUTH ONCE DAILY WITH BREAKFAST 30 capsule 5  . vitamin B-12 (CYANOCOBALAMIN) 500 MCG tablet Take 2500 mcg daily.     No current facility-administered medications on file prior to visit.     PAST MEDICAL HISTORY:   Past Medical History:  Diagnosis Date  . Allergy    SEASONAL  . Anemia    Anemia-NOS / PMH, Dr Julien Nordmann  . Arthritis   . Breast cancer ([place])    stage I right breast  . Cataract    BILATERAL  . Decreased vision    R eye  . Esophageal Stricture   . Hyperlipidemia   . Monoclonal gammopathy   . MVP (mitral valve prolapse)   . Osteoporosis   . Parkinson disease ([place])   . Skin cancer    basal cell L neck  . Vertical diplopia  PAST SURGICAL HISTORY:   Past Surgical History:  Procedure Laterality Date  . APPENDECTOMY  2002  . BONE BIOPSY  11/06/10  . BREAST LUMPECTOMY  02/2010   right breast lumpectomy and sentinel node biopsy  . CARDIAC CATHETERIZATION  2005   neg  . COLONOSCOPY     negative   . HEMORRHOID SURGERY  1970's  . SALPINGOOPHORECTOMY  2002   for benign growths  . SHOULDER SURGERY Right   . TOTAL ABDOMINAL HYSTERECTOMY W/ BILATERAL SALPINGOOPHORECTOMY  75 yrs old   for pain,prolapse ; G3 P2  . UPPER GASTROINTESTINAL ENDOSCOPY      SOCIAL HISTORY:   Social  History   Social History  . Marital status: Widowed    Spouse name: N/A  . Number of children: 2  . Years of education: N/A   Occupational History  . Retired    Social History Main Topics  . Smoking status: Never Smoker  . Smokeless tobacco: Never Used  . Alcohol use No  . Drug use: No  . Sexual activity: Not on file   Other Topics Concern  . Not on file   Social History Narrative   Occupation: Textile   Widowed 2016, husband had cancer, was married in 1959    Patient has never smoked.    Alcohol Use - no   Illicit Drug Use - no   Patient does not get regular exercise.    Daily Caffeine Use: 3 cups of coffee daily    FAMILY HISTORY:   Family Status  Relation Status  . Mother Deceased   heart disease  . Brother Alive   diabetes  . Brother Deceased   heart disease, renal failure  . Father Deceased   hepatitis  . Sister Alive   diabetes  . Brother Deceased   drowned as a teenager  . Brother Alive   diabetes  . Brother Alive   diabetes  . Sister Alive   diabetes, liver disease (alcohol)  . Sister Alive   healthy  . Son Alive   sarcoidosis  . Daughter Alive   healthy  . Cousin Alive   Parkinsons disease  . Neg Hx     ROS:  A complete 10 system review of systems was obtained and was unremarkable apart from what is mentioned above.  PHYSICAL EXAMINATION:    VITALS:   Vitals:   11/18/15 0825  Weight: 128 lb (58.1 kg)  Height: 5\' 5"  (1.651 m)   Orthostatic VS for the past 24 hrs (Last 3 readings):  BP- Lying Pulse- Lying BP- Sitting Pulse- Sitting BP- Standing at 0 minutes Pulse- Standing at 0 minutes  11/18/15 0825 130/80 74 128/78 80 110/72 84    GEN:  The patient appears stated age and is in NAD.   HEENT:  Normocephalic.  She has ecchymosis on the L cheek (covered with makeup).    The mucous membranes are moist. The superficial temporal arteries are without ropiness or tenderness. CV:  RRR Lungs:  CTAB Neck:  No bruits   Neurological  examination:  Orientation: The patient is alert and oriented x3.  Cranial nerves: There is good facial symmetry. There is facial hypomimia. The speech is fluent and clear. Soft palate rises symmetrically and there is no tongue deviation. Hearing is intact to conversational tone. Sensation: Sensation is intact to light touch throughout Motor: Strength is 5/5 in the bilateral upper and lower extremities.   Shoulder shrug is equal and symmetric.  There is no pronator drift.  Deep tendon reflexes: Deep tendon reflexes are 3/4 at the bilateral biceps, triceps, brachioradialis, patella and achilles. Plantar responses are downgoing bilaterally.  Movement examination: Tone: There is normal tone in the upper extremity Abnormal movements: There is very rare tremor in the LUE.   Coordination:  There is good RAM's today except some mild trouble with "turning in lightbulb" on L Gait and Station: The patient has no trouble getting OOC.  Walks well with good arm swing.  ASSESSMENT/PLAN:  1.  Idiopathic Parkinson's disease.  She was dx in May, 2015.    -She is doing well on carbidopa/levodopa 25/100 tid.  Risks, benefits, side effects and alternative therapies were discussed.  The opportunity to ask questions was given and they were answered to the best of my ability.  The patient expressed understanding and willingness to follow the outlined treatment protocols. 2.  Depression  -continue effexor.  XR, 75 mg daily  Crying spells resolved with this this small dose.   Risks, benefits, side effects and alternative therapies were discussed.  The opportunity to ask questions was given and they were answered to the best of my ability.  The patient expressed understanding and willingness to follow the outlined treatment protocols.  Samples given.   4.  Dysphagia  -MBE on 04/21/15 was normal  -[name] did not think this was GI issue. 5.  Orthostatic hypotension  -Drinking more water now and feeling less  dizzy  -Talked to her about Northera but orthostatics today are much better than in the past and worry about creating supine HTN.  Wear compression binder as much as possible.  Makes her hot but now that weather cooler should be able to do that more. 6.  .Follow up is anticipated in the next 4 months, sooner should new neurologic issues arise.  Much greater than 50% of this visit was spent in counseling and coordinating care.  Total face to face time:  25 min

## 2015-11-17 DIAGNOSIS — M25611 Stiffness of right shoulder, not elsewhere classified: Secondary | ICD-10-CM | POA: Diagnosis not present

## 2015-11-18 ENCOUNTER — Encounter: Payer: Self-pay | Admitting: Neurology

## 2015-11-18 ENCOUNTER — Ambulatory Visit (INDEPENDENT_AMBULATORY_CARE_PROVIDER_SITE_OTHER): Payer: Medicare Other | Admitting: Neurology

## 2015-11-18 VITALS — Ht 65.0 in | Wt 128.0 lb

## 2015-11-18 DIAGNOSIS — G20 Parkinson's disease: Secondary | ICD-10-CM

## 2015-11-19 DIAGNOSIS — Z4789 Encounter for other orthopedic aftercare: Secondary | ICD-10-CM | POA: Diagnosis not present

## 2015-11-24 DIAGNOSIS — M25611 Stiffness of right shoulder, not elsewhere classified: Secondary | ICD-10-CM | POA: Diagnosis not present

## 2015-12-03 DIAGNOSIS — M25611 Stiffness of right shoulder, not elsewhere classified: Secondary | ICD-10-CM | POA: Diagnosis not present

## 2015-12-10 DIAGNOSIS — M25611 Stiffness of right shoulder, not elsewhere classified: Secondary | ICD-10-CM | POA: Diagnosis not present

## 2015-12-15 ENCOUNTER — Ambulatory Visit: Payer: Medicare Other | Admitting: Neurology

## 2015-12-30 ENCOUNTER — Encounter: Payer: Self-pay | Admitting: Family Medicine

## 2015-12-30 ENCOUNTER — Ambulatory Visit (INDEPENDENT_AMBULATORY_CARE_PROVIDER_SITE_OTHER): Payer: Medicare Other | Admitting: Family Medicine

## 2015-12-30 DIAGNOSIS — J019 Acute sinusitis, unspecified: Secondary | ICD-10-CM | POA: Insufficient documentation

## 2015-12-30 DIAGNOSIS — J0100 Acute maxillary sinusitis, unspecified: Secondary | ICD-10-CM

## 2015-12-30 MED ORDER — DOXYCYCLINE HYCLATE 100 MG PO TABS: 100.0000 mg | ORAL_TABLET | Freq: Two times a day (BID) | ORAL | 0 refills | Status: DC

## 2015-12-30 NOTE — Progress Notes (Signed)
Sx started about 2.5 weeks ago.  Initially had cold sx, ST, fever.  Fever resolved now but still with HA and cough.  Coughed "all night last night."  No sputum.  Still with nasal discharge.  Prev facial resolved but still with tooth pain.    Meds, vitals, and allergies reviewed.   ROS: Per HPI unless specifically indicated in ROS section   GEN: nad, alert and oriented HEENT: mucous membranes moist, tm w/o erythema, nasal exam w/o erythema, clear discharge noted,  OP with cobblestoning, sinuses ttp x4 NECK: supple w/o LA CV: rrr.   PULM: ctab, no inc wob EXT: no edema SKIN: no acute rash L hand tremor noted.

## 2015-12-30 NOTE — Assessment & Plan Note (Signed)
Frontal and maxillary, B.  Nontoxic.  Ctab.  Okay for outpatient f/u.  Start doxy.  Continue flonase.  Update me as needed.  She agrees.

## 2015-12-30 NOTE — Progress Notes (Signed)
Pre visit review using our clinic review tool, if applicable. No additional management support is needed unless otherwise documented below in the visit note. 

## 2015-12-30 NOTE — Patient Instructions (Signed)
Start doxycycline and keep using flonase.  Take care.  Glad to see you.  Update me as needed.

## 2016-01-08 ENCOUNTER — Telehealth: Payer: Self-pay | Admitting: Family Medicine

## 2016-01-08 DIAGNOSIS — Z853 Personal history of malignant neoplasm of breast: Secondary | ICD-10-CM

## 2016-01-08 NOTE — Telephone Encounter (Signed)
Pt called because she is scheduled for a mamogram on 1/4 and an order is needed for a diagnostic mamogram.  She is having this done at [facility] on [facility]. Can you please put the order in.

## 2016-01-09 NOTE — Telephone Encounter (Signed)
Ordered. Thanks

## 2016-01-26 HISTORY — PX: CATARACT EXTRACTION, BILATERAL: SHX1313

## 2016-01-29 ENCOUNTER — Encounter: Payer: Self-pay | Admitting: Family Medicine

## 2016-01-29 DIAGNOSIS — Z853 Personal history of malignant neoplasm of breast: Secondary | ICD-10-CM | POA: Diagnosis not present

## 2016-01-29 DIAGNOSIS — R921 Mammographic calcification found on diagnostic imaging of breast: Secondary | ICD-10-CM | POA: Diagnosis not present

## 2016-02-02 ENCOUNTER — Other Ambulatory Visit: Payer: Self-pay | Admitting: Family Medicine

## 2016-02-02 NOTE — Telephone Encounter (Signed)
Electronic refill request. Last Filled:    60 tablet 5 05/08/2015  Please advise.

## 2016-02-03 NOTE — Telephone Encounter (Signed)
Medication phoned to pharmacy.  

## 2016-02-03 NOTE — Telephone Encounter (Signed)
Please call in.  Thanks.   

## 2016-02-05 ENCOUNTER — Ambulatory Visit: Payer: Medicare Other | Admitting: Physical Therapy

## 2016-02-05 ENCOUNTER — Ambulatory Visit: Payer: Medicare Other | Admitting: Occupational Therapy

## 2016-02-05 ENCOUNTER — Ambulatory Visit: Payer: Medicare Other

## 2016-02-09 ENCOUNTER — Other Ambulatory Visit: Payer: Self-pay | Admitting: Internal Medicine

## 2016-02-09 DIAGNOSIS — C50919 Malignant neoplasm of unspecified site of unspecified female breast: Secondary | ICD-10-CM

## 2016-02-16 NOTE — Progress Notes (Signed)
[name] was seen today in the movement disorders clinic for neurologic consultation at the request of [name], MD.  The consultation is for the evaluation of tremor.  The records that were made available to me were reviewed.  No one accompanies the patient.   The first symptom(s) the patient noticed was unilateral hand tremor on the L and this was 4-5 months ago.  She is R hand dominant.  She states that it was intermittent when it started but now it is more constant.  No tremor elsewhere.    Only living relative with PD is a cousin, who is currently being treated at [facility].  08/17/13 update:  Pt states that tremor is improved on levodopa but not sure if it is otherwise helping (but may be).  Takes med at 8am/1pm/5-7pm.  No SE.  Blood pressure has been low but states that it is always low.  No lightheadedness.  Balance is not good, but no falls.  She completed therapy.  Is exercising at home.  She walks an hr a day 6 days per week and when she goes to the gym, she will use the treadmill.  Has signed up for PD exercise class.   11/20/13 update:  Pt f/u re: PD.  Is on carbidopa/levodopa 25/100 tid.  Is doing well.  No falls.  No lightheadedness.  No hallucinations.  Exercising faithfully.   Is having an aching pain in the shoulder and in the thumb region on the R side but PD primarily affects her on the L.  Some occasional tremor on the L.    04/10/14 update:  Pt is on carbidopa/levodopa 25/100 tid.  She is stable in regards to PD but unfortunately her husband just died 72 days ago.  She just started back exercising with her sister in law.  No SI/HI.  No falls.  No lightheadeness.  Some tremor but no more than usual.  08/12/14 update:  Pt is on carbidopa/levodopa 25/100 three times per day.  She has done PT/OT/ST since our last visit and just completed her last therapy session on 08/01/14.  She had an injection in her shoulder as well and then had therapy for that and it is feeling better.   No falls since our last visit.  No lightheadedness.  She is exercising faithfully.  She is walking for exercise 6 times a week.  She admits that she is crying a lot and states that it started before her husband died and sometimes it is crying over inconsequential stuff.  Sometimes, she cries over her husbands death but doesn't want medication for that.  She is not SI/HI.  12/16/14 update:  Pt is following up today and is still on carbidopa/levodopa 25/100 tid.   No significant changes since last visit.  She goes to the gym 6 days a week.  Sometimes, she will go two times a day.   She is going to be spending thanksgiving with her son.    Wearing off:  No.  How long before next dose:  n/a Falls:   No. N/V:  No. (had some nausea this past weekend but thinks related to food she ate) Hallucinations:  No.  visual distortions: No. Lightheaded:  Yes.  , just last few days  Syncope: No. Dyskinesia:  No.   04/15/15 update:  The patient presents today for follow-up.  She has a history of Parkinson's disease.  She is currently on carbidopa/levodopa 25/100, 3 times per day.  Overall, she reports  that she is doing well.  She denies any falls.  No hallucinations.  No lightheadedness or near syncope.  She is still exercising.  She started occupational therapy for her shoulder pain.  I saw that speech therapy recommended that she see GI because she had her esophagus structure in the past.  She has an appointment with [name] on March 30.  She states that she is having a little problem swallowing but "it isn't bad yet."  States that she is crying for unknown reason (watching the news, etc).  Doesn't think that it is depression.  09/09/15 update:  The patient follows up today.  I have reviewed multiple records since her last visit.  She remains on carbidopa/levodopa 25/100, one tablet 3 times per day.  Last visit, I was going to start her on Nuedexta, but it turns out that this interfered with her tamoxifen.  We ended  up starting her on Effexor XR, 75 mg daily.  The patient states that the crying spells resolved with the effexor.  She did have a modified barium swallow on 04/21/2015 that was normal.  She continued to have a globus sensation and saw [name] for this.  He felt that she had mild oropharyngeal dysphagia related to Parkinson's, but that was not demonstrated on her barium swallow.  He did do a CT of the abdomen that was negative.  The patient did go to the emergency room on 05/07/2015 with headache.  That has since resolved.  She has also been to Dr. [name] with lightheadedness, and also described vertigo to the physical therapist and they recommended vestibular rehabilitation.  She has been going to rehabilitation therapy.   That did help.  She has not had any falls.  No syncopal episodes.  No falls.  She does tell me she had surgery on 7/18 by [name] for rotator cuff.  She is still in a sling.    10/15/15 update:  The patient follows up today.  She is on carbidopa/levodopa 25/100, one tablet 3 times per day.  She is also on Effexor XR, 75 mg daily.  She feels that mood has been good.  She has significant orthostatic hypotension, but I was leery about giving her medication for this last visit because she was complaining about a lot of chest pain.  Not long after that, she saw [name], who ordered an echocardiogram which was performed on 09/23/2015 and was unremarkable.  Ejection fraction was 55-60%.  She had a nuclear stress test and there was no significant reversible ischemia. LVEF 67% with normal wall motion on the stress Myoview.  She did fall this past Sunday and hit her head.  She was putting on underwear and tripped and her face hit the floor.  Everytime she chews, her L face and jaw hurt.  She also has a bilateral frontal headache.  "It feels like a regular headache like I have had before."   Was told that she couldn't do therapy yesterday until she saw her doctor.  L shoulder is a bit sore.  Admits  that she hasn't been drinking quite as much water.  11/18/15 update:  The patient follows up today.  She remains on carbidopa/levodopa 25/100, one tablet 3 times per day.  She did have a CT of the brain and maxillofacial regions right after our last visit, due to a fall.  Fortunately, there was no intracranial abnormalities and no facial fractures.  She has recovered nicely.  She has had no further  falls.  Re: lightheaded, feeling a "little dizzy" with standing.  Thinks dizziness has not been "bad."  Wears abdominal binder some but makes her "hot."    Her mood is doing good on Effexor XR, 75 mg daily.  "I don't cry any more all of the time."  Going to PT and she is walking for exercise.  Is back to drinking about 5-6 bottles water per day.  She has started to note tremor in the RUE but not as much as in the LUE  02/18/16 update:  Patient follows up today, on carbidopa/levodopa 25/100, one tablet 3 times per day.  Overall, the patient states she has been stable.  She has had several falls since our last visit.  One time, she was vacuuming and moving backward and fell.  Two times, she fell on a curb.  She also fell backing up in the bathroom and she hit the toilet and the bathtub.  She got a "little hurt."  The last time she was dreaming and she fell out of the bed and hit her head on the floor.  That was about a week ago.  Denies LOC/alternation in consciousness.   Last PT for PD at end of July.  Had therapy for shoulder in November after shoulder surgery.   Occasional lightheadedness but no near syncope.  Mood has been good on Effexor XR, 75 mg daily.  Neuroimaging has not previously been performed.  It was supposed to be done 06/01/13 but pt cancelled as she was claustrophobic and couldn't do the scan even though it was in an open unit.   PREVIOUS MEDICATIONS: none to date  ALLERGIES:   Allergies  Allergen Reactions  . Penicillins Shortness Of Breath and Itching    CURRENT MEDICATIONS:  Current  Outpatient Prescriptions on File Prior to Visit  Medication Sig Dispense Refill  . aspirin 81 MG tablet Take 81 mg by mouth daily.      [name] b complex vitamins tablet Take 1 tablet by mouth daily.    . Calcium 1500 MG tablet Take 1,500 mg by mouth.      . carbidopa-levodopa (SINEMET IR) 25-100 MG tablet Take 1 tablet by mouth 3 (three) times daily. 90 tablet 5  . cholecalciferol (VITAMIN D) 1000 UNITS tablet Take 5,000 Units by mouth daily.      . Denosumab (PROLIA Ventana) Inject into the skin every 6 (six) months.    . Elastic Bandages & Supports (ABDOMINAL BINDER/ELASTIC SMALL) MISC 1 Device by Does not apply route daily. 1 each 0  . fluticasone (FLONASE) 50 MCG/ACT nasal spray Place 2 sprays into both nostrils daily. 16 g 1  . Multiple Vitamin (MULTIVITAMIN) capsule 2 tabs po qd     . naproxen (NAPROSYN) 500 MG tablet Take 500 mg by mouth as directed.    . nitroGLYCERIN (NITROSTAT) 0.3 MG SL tablet Place 1 tablet (0.3 mg total) under the tongue every 5 (five) minutes as needed (max 3 doses). Reported on 04/07/2015 25 tablet 3  . Omega-3 Fatty Acids (OMEGA 3 PO) 1 tab po qd     . omeprazole (PRILOSEC OTC) 20 MG tablet Take 1 tablet (20 mg total) by mouth daily. 90 tablet 3  . tamoxifen (NOLVADEX) 20 MG tablet TAKE ONE TABLET BY MOUTH ONCE DAILY 30 tablet 2  . traMADol (ULTRAM) 50 MG tablet TAKE ONE TO TWO TABLETS BY MOUTH AT BEDTIME AS NEEDED FOR PAIN 60 tablet 5  . venlafaxine XR (EFFEXOR-XR) 75 MG 24 hr capsule  TAKE ONE CAPSULE BY MOUTH ONCE DAILY WITH BREAKFAST 30 capsule 5  . vitamin B-12 (CYANOCOBALAMIN) 500 MCG tablet Take 2500 mcg daily.     No current facility-administered medications on file prior to visit.     PAST MEDICAL HISTORY:   Past Medical History:  Diagnosis Date  . Allergy    SEASONAL  . Anemia    Anemia-NOS / PMH, Dr Julien Nordmann  . Arthritis   . Breast cancer ([place])    stage I right breast  . Cataract    BILATERAL  . Decreased vision    R eye  . Esophageal Stricture   .  Hyperlipidemia   . Monoclonal gammopathy   . MVP (mitral valve prolapse)   . Osteoporosis   . Parkinson disease ([place])   . Skin cancer    basal cell L neck  . Vertical diplopia     PAST SURGICAL HISTORY:   Past Surgical History:  Procedure Laterality Date  . APPENDECTOMY  2002  . BONE BIOPSY  11/06/10  . BREAST LUMPECTOMY  02/2010   right breast lumpectomy and sentinel node biopsy  . CARDIAC CATHETERIZATION  2005   neg  . COLONOSCOPY     negative   . HEMORRHOID SURGERY  1970's  . SALPINGOOPHORECTOMY  2002   for benign growths  . SHOULDER SURGERY Right   . TOTAL ABDOMINAL HYSTERECTOMY W/ BILATERAL SALPINGOOPHORECTOMY  76 yrs old   for pain,prolapse ; G3 P2  . UPPER GASTROINTESTINAL ENDOSCOPY      SOCIAL HISTORY:   Social History   Social History  . Marital status: Widowed    Spouse name: N/A  . Number of children: 2  . Years of education: N/A   Occupational History  . Retired    Social History Main Topics  . Smoking status: Never Smoker  . Smokeless tobacco: Never Used  . Alcohol use No  . Drug use: No  . Sexual activity: Not on file   Other Topics Concern  . Not on file   Social History Narrative   Occupation: Textile   Widowed 2016, husband had cancer, was married in 1959    Patient has never smoked.    Alcohol Use - no   Illicit Drug Use - no   Patient does not get regular exercise.    Daily Caffeine Use: 3 cups of coffee daily    FAMILY HISTORY:   Family Status  Relation Status  . Mother Deceased   heart disease  . Brother Alive   diabetes  . Brother Deceased   heart disease, renal failure  . Father Deceased   hepatitis  . Sister Alive   diabetes  . Brother Deceased   drowned as a teenager  . Brother Alive   diabetes  . Brother Alive   diabetes  . Sister Alive   diabetes, liver disease (alcohol)  . Sister Alive   healthy  . Son Alive   sarcoidosis  . Daughter Alive   healthy  . Cousin Alive   Parkinsons disease  . Neg Hx      ROS:  A complete 10 system review of systems was obtained and was unremarkable apart from what is mentioned above.  PHYSICAL EXAMINATION:    VITALS:   Vitals:   02/18/16 0946  BP: 140/60  Pulse: 88  Weight: 129 lb (58.5 kg)  Height: 5\' 5"  (1.651 m)   No data found.   GEN:  The patient appears stated age and is in NAD.  HEENT:  Normocephalic.  She has ecchymosis on the L cheek (covered with makeup).    The mucous membranes are moist. The superficial temporal arteries are without ropiness or tenderness. CV:  RRR Lungs:  CTAB Neck:  No bruits   Neurological examination:  Orientation: The patient is alert and oriented x3.  Cranial nerves: There is good facial symmetry. There is facial hypomimia. The speech is fluent and clear. Soft palate rises symmetrically and there is no tongue deviation. Hearing is intact to conversational tone. Sensation: Sensation is intact to light touch throughout Motor: Strength is 5/5 in the bilateral upper and lower extremities.   Shoulder shrug is equal and symmetric.  There is no pronator drift. Deep tendon reflexes: Deep tendon reflexes are 3/4 at the bilateral biceps, triceps, brachioradialis, patella and achilles. Plantar responses are downgoing bilaterally.  Movement examination: Tone: There is normal tone in the upper extremity Abnormal movements: There is very rare tremor in the LUE.   Coordination:  There is good RAM's today except some mild trouble with "turning in lightbulb" on L Gait and Station: The patient has no trouble getting OOC.  Walks well with good arm swing.  ASSESSMENT/PLAN:  1.  Idiopathic Parkinson's disease.  She was dx in May, 2015.    -She is doing well on carbidopa/levodopa 25/100 tid.  Risks, benefits, side effects and alternative therapies were discussed.  The opportunity to ask questions was given and they were answered to the best of my ability.  The patient expressed understanding and willingness to follow the  outlined treatment protocols.  -has screen for PT in march.  Has had multiple falls.  Will send order for PT.   2.  Depression  -continue effexor.  XR, 75 mg daily  Crying spells resolved with this this small dose.   Risks, benefits, side effects and alternative therapies were discussed.  The opportunity to ask questions was given and they were answered to the best of my ability.  The patient expressed understanding and willingness to follow the outlined treatment protocols.  Samples given.   4.  Dysphagia  -MBE on 04/21/15 was normal  -[name] did not think this was GI issue. 5.  Orthostatic hypotension  -Drinking more water now and feeling less dizzy  -Talked to her about Northera but orthostatics today are much better than in the past and worry about creating supine HTN.  Wear compression binder as much as possible, which she is doing.   6.  REM behavior disorder  -fell out of bed in Jan, 2017 and hit head and knee.  Talked about bed rails and klonopin.  Her bed is very tall and she lives alone.  Will start low dose klonopin - 0.5 mg - 1/2 tablet at night.   7.  .Follow up is anticipated in the next 4 months, sooner should new neurologic issues arise.  Much greater than 50% of this visit was spent in counseling and coordinating care.  Total face to face time:  25 min

## 2016-02-18 ENCOUNTER — Encounter: Payer: Self-pay | Admitting: Neurology

## 2016-02-18 ENCOUNTER — Ambulatory Visit (INDEPENDENT_AMBULATORY_CARE_PROVIDER_SITE_OTHER): Payer: Medicare Other | Admitting: Neurology

## 2016-02-18 VITALS — BP 114/60 | HR 88 | Ht 65.0 in | Wt 129.0 lb

## 2016-02-18 DIAGNOSIS — G903 Multi-system degeneration of the autonomic nervous system: Secondary | ICD-10-CM

## 2016-02-18 DIAGNOSIS — G4752 REM sleep behavior disorder: Secondary | ICD-10-CM

## 2016-02-18 DIAGNOSIS — G20 Parkinson's disease: Secondary | ICD-10-CM

## 2016-02-18 DIAGNOSIS — G20A1 Parkinson's disease without dyskinesia, without mention of fluctuations: Secondary | ICD-10-CM

## 2016-02-18 MED ORDER — CLONAZEPAM 0.5 MG PO TABS: 0.2500 mg | ORAL_TABLET | Freq: Every day | ORAL | 3 refills | Status: DC

## 2016-02-18 NOTE — Patient Instructions (Signed)
1. You have been referred to [facility] for [facility] therapy. They will call you directly to schedule an appointment.  Please call [phone] if you do not hear from them.   2. Start Clonazepam 0.5 mg - 1/2 tablet at bedtime. Prescription sent to your pharmacy.

## 2016-02-25 DIAGNOSIS — L111 Transient acantholytic dermatosis [Grover]: Secondary | ICD-10-CM | POA: Diagnosis not present

## 2016-02-25 DIAGNOSIS — Z1283 Encounter for screening for malignant neoplasm of skin: Secondary | ICD-10-CM | POA: Diagnosis not present

## 2016-02-25 DIAGNOSIS — L821 Other seborrheic keratosis: Secondary | ICD-10-CM | POA: Diagnosis not present

## 2016-03-10 ENCOUNTER — Ambulatory Visit: Payer: Medicare Other | Attending: Neurology | Admitting: Physical Therapy

## 2016-03-10 DIAGNOSIS — R2681 Unsteadiness on feet: Secondary | ICD-10-CM | POA: Diagnosis not present

## 2016-03-10 DIAGNOSIS — R2689 Other abnormalities of gait and mobility: Secondary | ICD-10-CM | POA: Insufficient documentation

## 2016-03-10 DIAGNOSIS — R293 Abnormal posture: Secondary | ICD-10-CM | POA: Diagnosis not present

## 2016-03-10 DIAGNOSIS — R29818 Other symptoms and signs involving the nervous system: Secondary | ICD-10-CM | POA: Diagnosis not present

## 2016-03-10 NOTE — Therapy (Signed)
[facility] [address] [place], [name], [address] Phone: [phone]   Fax:  [phone]  Physical Therapy Evaluation  Patient Details  Name: [name] MRN: [id number] Date of Birth: 11-18-1940 Referring Provider: Alonza Bogus  Encounter Date: 03/10/2016      PT End of Session - 03/10/16 2152    Visit Number 1   Number of Visits 10   Date for PT Re-Evaluation 05/09/16   Authorization Type Medicare Primary, BCBS secondary-GCODE every 10th visit   PT Start Time 0930   PT Stop Time 1014   PT Time Calculation (min) 44 min   Activity Tolerance Patient tolerated treatment well   Behavior During Therapy [facility] for tasks assessed/performed      Past Medical History:  Diagnosis Date  . Allergy    SEASONAL  . Anemia    Anemia-NOS / PMH, Dr Julien Nordmann  . Arthritis   . Breast cancer ([place])    stage I right breast  . Cataract    BILATERAL  . Decreased vision    R eye  . Esophageal Stricture   . Hyperlipidemia   . Monoclonal gammopathy   . MVP (mitral valve prolapse)   . Osteoporosis   . Parkinson disease ([place])   . Skin cancer    basal cell L neck  . Vertical diplopia     Past Surgical History:  Procedure Laterality Date  . APPENDECTOMY  2002  . BONE BIOPSY  11/06/10  . BREAST LUMPECTOMY  02/2010   right breast lumpectomy and sentinel node biopsy  . CARDIAC CATHETERIZATION  2005   neg  . COLONOSCOPY     negative   . HEMORRHOID SURGERY  1970's  . SALPINGOOPHORECTOMY  2002   for benign growths  . SHOULDER SURGERY Right   . TOTAL ABDOMINAL HYSTERECTOMY W/ BILATERAL SALPINGOOPHORECTOMY  76 yrs old   for pain,prolapse ; G3 P2  . UPPER GASTROINTESTINAL ENDOSCOPY      There were no vitals filed for this visit.       Subjective Assessment - 03/10/16 0931    Subjective Pt reports she saw Dr. Carles Collet and she was concerned about recent falls.  Pt reports 6 falls since mid-November.  One fall was getting  dressed on one foot (hit head on floor), other falls have been backwards.  When I get started going backwards, I can't stop, just keep going faster.  Pt not using assistive device.   Pertinent History R rotator cuff surgery, breast cancer, osteoporosis   Patient Stated Goals Pt's goal for therapy is to help to keep from falling.   Currently in Pain? No/denies            [facility] PT Assessment - 03/10/16 [id number]      Assessment   Medical Diagnosis Parkinson's disease   Referring Provider Tat, Wells Guiles   Onset Date/Surgical Date 02/18/16  MD visit     Precautions   Precautions Fall  Avoid heavy lifting     Balance Screen   Has the patient fallen in the past 6 months Yes   How many times? 6  since November   Has the patient had a decrease in activity level because of a fear of falling?  No   Is the patient reluctant to leave their home because of a fear of falling?  No     Home Environment   Living Environment Private residence   Living Arrangements Alone   Type of [place]  Home Access Stairs to enter   Entrance Stairs-Number of Steps 7   Entrance Stairs-Rails Can reach both   Home Layout One level     Prior Function   Level of Independence Independent with basic ADLs;Independent with household mobility without device;Independent with community mobility without device   Leisure Walks everyday at gym (1 hr/day)     Observation/Other Assessments   Focus on Therapeutic Outcomes (FOTO)  NA     Posture/Postural Control   Posture/Postural Control Postural limitations   Postural Limitations Forward head;Rounded Shoulders     ROM / Strength   AROM / PROM / Strength Strength     Strength   Overall Strength Deficits   Overall Strength Comments Grossly tested at least 4/5 hip flexion, quads, hamstrings; 3+/5 bilateral ankle dorsiflexion     Transfers   Transfers Sit to Stand;Stand to Sit   Sit to Stand 6: Modified independent (Device/Increase time);From chair/3-in-1;Without  upper extremity assist   Five time sit to stand comments  15.66   Stand to Sit 6: Modified independent (Device/Increase time);Without upper extremity assist;To chair/3-in-1;Uncontrolled descent  Pt describes uncontrolled descent into sitting at home   Comments Slight posterior pull noted with sit<>stand     Ambulation/Gait   Ambulation/Gait Yes   Ambulation/Gait Assistance 7: Independent   Ambulation Distance (Feet) 200 Feet   Assistive device None   Gait Pattern Step-through pattern;Decreased arm swing - right;Decreased arm swing - left;Narrow base of support;Trunk flexed;Poor foot clearance - left;Poor foot clearance - right;Decreased trunk rotation   Ambulation Surface Level;Indoor   Gait velocity 9.72 sec = 3.37 ft/sec   Gait velocity - backwards 11.81 sec in 20 ft (1.69 ft/sec)     Standardized Balance Assessment   Standardized Balance Assessment Timed Up and Go Test     Timed Up and Go Test   Normal TUG (seconds) 10.97   Manual TUG (seconds) 12.37   Cognitive TUG (seconds) 11.75   TUG Comments Scores >13.5-15 sec indicate increased fall risk.; pt noted to be unsteady with turn during TUG     High Level Balance   High Level Balance Comments MiniBESTest 17/28         Mini-BESTest: Balance Evaluation Systems Test  2005-2013 [employer]. All rights reserved. ________________________________________________________________________________________Anticipatory_________Subscore__3__/6 1. SIT TO STAND Instruction: "Cross your arms across your chest. Try not to use your hands unless you must.Do not let your legs lean against the back of the chair when you stand. Please stand up now." X(2) Normal: Comes to stand without use of hands and stabilizes independently. (1) Moderate: Comes to stand WITH use of hands on first attempt. (0) Severe: Unable to stand up from chair without assistance, OR needs several attempts with use of hands. 2. RISE TO  TOES Instruction: "Place your feet shoulder width apart. Place your hands on your hips. Try to rise as high as you can onto your toes. I will count out loud to 3 seconds. Try to hold this pose for at least 3 seconds. Look straight ahead. Rise now." (2) Normal: Stable for 3 s with maximum height. (1) Moderate: Heels up, but not full range (smaller than when holding hands), OR noticeable instability for 3 s. X(0) Severe: < 3 s. 3. STAND ON ONE LEG Instruction: "Look straight ahead. Keep your hands on your hips. Lift your leg off of the ground behind you without touching or resting your raised leg upon your other standing leg. Stay standing on one leg as long  as you can. Look straight ahead. Lift now." Left: Time in Seconds Trial 1:_20 sec____Trial 2:__20 sec___ [place]) Normal: 20 s. (1) Moderate: < 20 s. (0) Severe: Unable. Right: Time in Seconds Trial 1:__6.09___Trial 2:_4.66____ (2) Normal: 20 s. X(1) Moderate: < 20 s. (0) Severe: Unable To score each side separately use the trial with the longest time. To calculate the sub-score and total score use the side [left or right] with the lowest numerical score [i.e. the worse side]. ______________________________________________________________________________________Reactive Postural Control___________Subscore:_3____/6 4. COMPENSATORY STEPPING CORRECTION- FORWARD Instruction: "Stand with your feet shoulder width apart, arms at your sides. Lean forward against my hands beyond your forward limits. When I let go, do whatever is necessary, including taking a step, to avoid a fall." (2) Normal: Recovers independently with a single, large step (second realignment step is allowed). X(1) Moderate: More than one step used to recover equilibrium. (0) Severe: No step, OR would fall if not caught, OR falls spontaneously. 5. COMPENSATORY STEPPING CORRECTION- BACKWARD Instruction: "Stand with your feet shoulder width apart, arms at your sides. Lean backward  against my hands beyond your backward limits. When I let go, do whatever is necessary, including taking a step, to avoid a fall." (2) Normal: Recovers independently with a single, large step. (1) Moderate: More than one step used to recover equilibrium. X(0) Severe: No step, OR would fall if not caught, OR falls spontaneously. 6. COMPENSATORY STEPPING CORRECTION- LATERAL Instruction: "Stand with your feet together, arms down at your sides. Lean into my hand beyond your sideways limit. When I let go, do whatever is necessary, including taking a step, to avoid a fall." Left X(2) Normal: Recovers independently with 1 step (crossover or lateral OK). (1) Moderate: Several steps to recover equilibrium. (0) Severe: Falls, or cannot step. Right X(2) Normal: Recovers independently with 1 step (crossover or lateral OK). (1) Moderate: Several steps to recover equilibrium. (0) Severe: Falls, or cannot step. Use the side with the lowest score to calculate sub-score and total score. ____________________________________________________________________________________Sensory Orientation_____________Subscore:_____5____/6 7. STANCE (FEET TOGETHER); EYES OPEN, FIRM SURFACE Instruction: "Place your hands on your hips. Place your feet together until almost touching. Look straight ahead. Be as stable and still as possible, until I say stop." Time in seconds:________ X(2) Normal: 30 s. (1) Moderate: < 30 s. (0) Severe: Unable. 8. STANCE (FEET TOGETHER); EYES CLOSED, FOAM SURFACE Instruction: "Step onto the foam. Place your hands on your hips. Place your feet together until almost touching. Be as stable and still as possible, until I say stop. I will start timing when you close your eyes." Time in seconds:__<5 sec______ (2) Normal: 30 s. X(1) Moderate: < 30 s. (0) Severe: Unable. 9. INCLINE- EYES CLOSED Instruction: "Step onto the incline ramp. Please stand on the incline ramp with your toes toward the  top. Place your feet shoulder width apart and have your arms down at your sides. I will start timing when you close your eyes." Time in seconds:________ X(2) Normal: Stands independently 30 s and aligns with gravity. (1) Moderate: Stands independently <30 s OR aligns with surface. (0) Severe: Unable. _________________________________________________________________________________________Dynamic Gait ______Subscore____6____/10 10. CHANGE IN GAIT SPEED Instruction: "Begin walking at your normal speed, when I tell you 'fast', walk as fast as you can. When I say 'slow', walk very slowly." (2) Normal: Significantly changes walking speed without imbalance. X(1) Moderate: Unable to change walking speed or signs of imbalance. (0) Severe: Unable to achieve significant change in walking speed AND signs of imbalance. 11. WALK WITH HEAD TURNS -  HORIZONTAL Instruction: "Begin walking at your normal speed, when I say "right", turn your head and look to the right. When I say "left" turn your head and look to the left. Try to keep yourself walking in a straight line." (2) Normal: performs head turns with no change in gait speed and good balance. X(1) Moderate: performs head turns with reduction in gait speed. (0) Severe: performs head turns with imbalance. 12. WALK WITH PIVOT TURNS Instruction: "Begin walking at your normal speed. When I tell you to 'turn and stop', turn as quickly as you can, face the opposite direction, and stop. After the turn, your feet should be close together." (2) Normal: Turns with feet close FAST (< 3 steps) with good balance. X(1) Moderate: Turns with feet close SLOW (>4 steps) with good balance. (0) Severe: Cannot turn with feet close at any speed without imbalance. 13. STEP OVER OBSTACLES Instruction: "Begin walking at your normal speed. When you get to the box, step over it, not around it and keep walking." (2) Normal: Able to step over box with minimal change of gait speed  and with good balance. X(1) Moderate: Steps over box but touches box OR displays cautious behavior by slowing gait. (0) Severe: Unable to step over box OR steps around box. 14. TIMED UP & GO WITH DUAL TASK [3 METER WALK] Instruction TUG: "When I say 'Go', stand up from chair, walk at your normal speed across the tape on the floor, turn around, and come back to sit in the chair." Instruction TUG with Dual Task: "Count backwards by threes starting at ___. When I say 'Go', stand up from chair, walk at your normal speed across the tape on the floor, turn around, and come back to sit in the chair. Continue counting backwards the entire time." TUG: ________seconds; Dual Task TUG: ________seconds X(2) Normal: No noticeable change in sitting, standing or walking while backward counting when compared to TUG without Dual Task. (1) Moderate: Dual Task affects either counting OR walking (>10%) when compared to the TUG without Dual Task. (0) Severe: Stops counting while walking OR stops walking while counting. When scoring item 14, if subject's gait speed slows more than 10% between the TUG without and with a Dual Task the score should be decreased by a point. TOTAL SCORE: _____17___/28                        PT Long Term Goals - 03/10/16 2204      PT LONG TERM GOAL #1   Title Pt will be independent with HEP for improved balance, gait and transfers.  TARGET 04/09/16   Time 5   Period Weeks   Status New     PT LONG TERM GOAL #2   Title Pt will improve 5x sit<>stand to less than or equal to 13 seconds for improved transfer efficiency and safety.   Time 5   Period Weeks   Status New     PT LONG TERM GOAL #3   Title Pt will improve MiniBESTest to at least 22/28 for decreased fall risk.   Time 5   Period Weeks   Status New     PT LONG TERM GOAL #4   Title Pt will demo <2 steps to recover balance in posterior push and release test for improved balance recovery in posterior  direction.   Time 5   Period Weeks   Status New     PT LONG TERM GOAL #5  Title Pt will verbalize understanding of fall prevention in home environment.   Time 5   Period Weeks   Status New               Plan - 04/04/2016 05/20/2155    Clinical Impression Statement Pt is a 76 year old female who presents to OP PT with history of Parkinson's disease, with recent history of falls (6 falls since November).  See PMH in EPIC, which includes breast cancer and R RTC surgery.  She presents with abnormal posture, bradykinesia, slowed transfers, decreased balance, decreased timing and coordination gait with turns and retro gait, postural instability.  Pt will benefit from skilled physical therapy to address the above stated deficits to improve functional mobility and decrease fall risk.   Rehab Potential Good   PT Frequency 2x / week  1x/wk for 1 week, then   PT Duration 4 weeks  plus eval   PT Treatment/Interventions ADLs/Self Care Home Management;Functional mobility training;Gait training;Neuromuscular re-education;Patient/family education;Balance training;Therapeutic exercise;Therapeutic activities   PT Next Visit Plan Balance strategies in posterior direction, direction changes and turns; transfer practice   Consulted and Agree with Plan of Care Patient      Patient will benefit from skilled therapeutic intervention in order to improve the following deficits and impairments:  Abnormal gait, Decreased balance, Decreased mobility, Difficulty walking, Postural dysfunction, Decreased strength  Visit Diagnosis: Other abnormalities of gait and mobility  Unsteadiness on feet  Other symptoms and signs involving the nervous system  Abnormal posture      G-Codes - Apr 04, 2016 05-19-2205    Functional Assessment Tool Used 5x sit<>stand 15.66 sec, MiniBESTest 18/28, backwards gait velocity in 20 ft:  11.81 sec, 6 falls since November   Functional Limitation Mobility: Walking and moving around    Mobility: Walking and Moving Around Current Status [phone]) At least 20 percent but less than 40 percent impaired, limited or restricted   Mobility: Walking and Moving Around Goal Status [phone]) At least 1 percent but less than 20 percent impaired, limited or restricted       Problem List Patient Active Problem List   Diagnosis Date Noted  . Acute non-recurrent sinusitis 12/30/2015  . Headache 05/11/2015  . Globus sensation 04/24/2015  . Foot pain 02/25/2015  . Shoulder pain 04/17/2014  . PD (Parkinson's disease) ([place]) 06/05/2013  . Medicare annual wellness visit, subsequent 05/02/2012  . Osteoporosis 03/08/2011  . Breast cancer ([place]) 11/19/2010  . GERD (gastroesophageal reflux disease) 08/01/2008  . DYSPHAGIA UNSPECIFIED 08/01/2008  . ORTHOSTATIC HYPOTENSION 06/28/2008  . HLD (hyperlipidemia) 07/10/2007  . MONOCLONAL GAMMOPATHY 07/10/2007  . ANEMIA-NOS 07/10/2007  . FATIGUE 07/10/2007    Taleigha Pinson W. Apr 04, 2016, 10:14 PM  Frazier Butt., PT  [facility] [address] [place], [name], [address] Phone: [phone]   Fax:  [phone]  Name: [name] MRN: [id number] Date of Birth: 1940/10/25

## 2016-03-12 ENCOUNTER — Ambulatory Visit: Payer: Medicare Other | Admitting: Physical Therapy

## 2016-03-12 DIAGNOSIS — R293 Abnormal posture: Secondary | ICD-10-CM | POA: Diagnosis not present

## 2016-03-12 DIAGNOSIS — R2681 Unsteadiness on feet: Secondary | ICD-10-CM | POA: Diagnosis not present

## 2016-03-12 DIAGNOSIS — R29818 Other symptoms and signs involving the nervous system: Secondary | ICD-10-CM | POA: Diagnosis not present

## 2016-03-12 DIAGNOSIS — R2689 Other abnormalities of gait and mobility: Secondary | ICD-10-CM

## 2016-03-12 NOTE — Patient Instructions (Addendum)
Sit to Stand Transfers:  1. Scoot out to the edge of the chair 2. Place your feet flat on the floor, shoulder width apart.  Make sure your feet are tucked just under your knees. 3. Lean forward (nose over toes) with momentum, and stand up tall with your best posture.  If you need to use your arms, use them as a quick boost up to stand. 4. If you are in a low or soft chair, you can lean back and then forward up to stand, in order to get more momentum. 5. Once you are standing, make sure you are looking ahead and standing tall.  To sit down:  1. Back up until you feel the chair behind your legs. 2. Bend at your hips, reaching  Back for your chair, if needed, then slowly squat to sit down on your chair.   -Forward Step and weightshift-see additional picture  (Provided with [employer] Moves picture)  -Back step and weightshift-see additional picture (Provided with [employer] Moves picture)

## 2016-03-13 NOTE — Therapy (Signed)
[facility] [address] [place], [name], [address] Phone: [phone]   Fax:  [phone]  Physical Therapy Treatment  Patient Details  Name: [name] MRN: [id number] Date of Birth: 01-11-1941 Referring Provider: Alonza Bogus  Encounter Date: 03/12/2016      PT End of Session - 03/13/16 1412    Visit Number 2   Number of Visits 10   Date for PT Re-Evaluation 05/09/16   Authorization Type Medicare Primary, BCBS secondary-GCODE every 10th visit   PT Start Time 1018   PT Stop Time 1059   PT Time Calculation (min) 41 min   Activity Tolerance Patient tolerated treatment well   Behavior During Therapy [facility] for tasks assessed/performed      Past Medical History:  Diagnosis Date  . Allergy    SEASONAL  . Anemia    Anemia-NOS / PMH, Dr Julien Nordmann  . Arthritis   . Breast cancer ([place])    stage I right breast  . Cataract    BILATERAL  . Decreased vision    R eye  . Esophageal Stricture   . Hyperlipidemia   . Monoclonal gammopathy   . MVP (mitral valve prolapse)   . Osteoporosis   . Parkinson disease ([place])   . Skin cancer    basal cell L neck  . Vertical diplopia     Past Surgical History:  Procedure Laterality Date  . APPENDECTOMY  2002  . BONE BIOPSY  11/06/10  . BREAST LUMPECTOMY  02/2010   right breast lumpectomy and sentinel node biopsy  . CARDIAC CATHETERIZATION  2005   neg  . COLONOSCOPY     negative   . HEMORRHOID SURGERY  1970's  . SALPINGOOPHORECTOMY  2002   for benign growths  . SHOULDER SURGERY Right   . TOTAL ABDOMINAL HYSTERECTOMY W/ BILATERAL SALPINGOOPHORECTOMY  76 yrs old   for pain,prolapse ; G3 P2  . UPPER GASTROINTESTINAL ENDOSCOPY      There were no vitals filed for this visit.      Subjective Assessment - 03/12/16 1020    Subjective No changes, no falls since last visit   Pertinent History R rotator cuff surgery, breast cancer, osteoporosis   Patient Stated Goals  Pt's goal for therapy is to help to keep from falling.   Currently in Pain? No/denies                         [facility] Adult PT Treatment/Exercise - 03/12/16 1021      Transfers   Transfers Sit to Stand;Stand to Sit   Sit to Stand 6: Modified independent (Device/Increase time);From chair/3-in-1;Without upper extremity assist;From bed   Stand to Sit 6: Modified independent (Device/Increase time);Without upper extremity assist;To chair/3-in-1;Uncontrolled descent;To bed   Number of Reps 10 reps;Other sets (comment)  from 20" mat, 18" chair, then from 17" chair   Comments Cues for technique, including forward scooting, forward lean and upright posture  Cues for slowed descent into sitting     High Level Balance   High Level Balance Activities Backward walking  Forward walking at counter, 5 reps intermittent UE support   High Level Balance Comments Forward step and weightshift x 10 reps, then backward step and weigthshift x 10 reps; forward<>back step and weigthshift x 10 reps with UE support at counter; heel/toe raises x 15 reps for ankle strategy work.  Cues for extended knees     Exercises   Exercises Knee/Hip  Knee/Hip Exercises: Aerobic   Nustep Level 3, lower extremities only, x 8 minutes with cues for steps/min >50 (pt keeps above 70) for intensity of exercise      Cues provided for stepping strategy activity for full excursion of weightshifting and for deliberate intensity of movements           PT Education - 03/13/16 1411    Education provided Yes   Education Details HEP, sit<>stand transfer technique   Person(s) Educated Patient   Methods Explanation;Demonstration;Verbal cues;Handout   Comprehension Verbalized understanding;Returned demonstration;Need further instruction             PT Long Term Goals - 03/10/16 2204      PT LONG TERM GOAL #1   Title Pt will be independent with HEP for improved balance, gait and transfers.  TARGET 04/09/16    Time 5   Period Weeks   Status New     PT LONG TERM GOAL #2   Title Pt will improve 5x sit<>stand to less than or equal to 13 seconds for improved transfer efficiency and safety.   Time 5   Period Weeks   Status New     PT LONG TERM GOAL #3   Title Pt will improve MiniBESTest to at least 22/28 for decreased fall risk.   Time 5   Period Weeks   Status New     PT LONG TERM GOAL #4   Title Pt will demo <2 steps to recover balance in posterior push and release test for improved balance recovery in posterior direction.   Time 5   Period Weeks   Status New     PT LONG TERM GOAL #5   Title Pt will verbalize understanding of fall prevention in home environment.   Time 5   Period Weeks   Status New               Plan - 03/13/16 1412    Clinical Impression Statement Focused skilled PT session today on initiation of HEP to address sit<>stand transfer technique as well as weigthshifting activities/stepping strategy for balance in posterior direction.  Pt will continue to benefit from skilled physical therapy to address balance, posture, transfers and gait.   Rehab Potential Good   PT Frequency 2x / week  1x/wk for 1 week, then   PT Duration 4 weeks  plus eval   PT Treatment/Interventions ADLs/Self Care Home Management;Functional mobility training;Gait training;Neuromuscular re-education;Patient/family education;Balance training;Therapeutic exercise;Therapeutic activities   PT Next Visit Plan Review HEP provided this visit; work on balance strategies in posterior direction, direction changes and turns, likely add forward/back walking to HEP   PT Home Exercise Plan 03/12/16:  sit<>stand transfers, forward step and weigthshift, back step and weightshift (step strategy)   Consulted and Agree with Plan of Care Patient      Patient will benefit from skilled therapeutic intervention in order to improve the following deficits and impairments:  Abnormal gait, Decreased balance,  Decreased mobility, Difficulty walking, Postural dysfunction, Decreased strength  Visit Diagnosis: Other abnormalities of gait and mobility  Unsteadiness on feet     Problem List Patient Active Problem List   Diagnosis Date Noted  . Acute non-recurrent sinusitis 12/30/2015  . Headache 05/11/2015  . Globus sensation 04/24/2015  . Foot pain 02/25/2015  . Shoulder pain 04/17/2014  . PD (Parkinson's disease) ([place]) 06/05/2013  . Medicare annual wellness visit, subsequent 05/02/2012  . Osteoporosis 03/08/2011  . Breast cancer ([place]) 11/19/2010  . GERD (gastroesophageal reflux disease)  08/01/2008  . DYSPHAGIA UNSPECIFIED 08/01/2008  . ORTHOSTATIC HYPOTENSION 06/28/2008  . HLD (hyperlipidemia) 07/10/2007  . MONOCLONAL GAMMOPATHY 07/10/2007  . ANEMIA-NOS 07/10/2007  . FATIGUE 07/10/2007    Yony Roulston W. 03/13/2016, 2:17 PM  Frazier Butt., PT  [facility] [address] [place], [name], [address] Phone: [phone]   Fax:  [phone]  Name: [name] MRN: [id number] Date of Birth: [birth year]

## 2016-03-18 ENCOUNTER — Ambulatory Visit: Payer: Medicare Other | Admitting: Physical Therapy

## 2016-03-19 ENCOUNTER — Ambulatory Visit: Payer: Medicare Other | Admitting: Physical Therapy

## 2016-03-19 ENCOUNTER — Encounter: Payer: Self-pay | Admitting: Physical Therapy

## 2016-03-19 DIAGNOSIS — R2689 Other abnormalities of gait and mobility: Secondary | ICD-10-CM | POA: Diagnosis not present

## 2016-03-19 DIAGNOSIS — R2681 Unsteadiness on feet: Secondary | ICD-10-CM | POA: Diagnosis not present

## 2016-03-19 DIAGNOSIS — R29818 Other symptoms and signs involving the nervous system: Secondary | ICD-10-CM | POA: Diagnosis not present

## 2016-03-19 DIAGNOSIS — R293 Abnormal posture: Secondary | ICD-10-CM | POA: Diagnosis not present

## 2016-03-19 NOTE — Therapy (Signed)
[facility] [address] [place], [name], [address] Phone: [phone]   Fax:  [phone]  Physical Therapy Treatment  Patient Details  Name: [name] MRN: [id number] Date of Birth: November 12, 1940 Referring Provider: Alonza Bogus  Encounter Date: 03/19/2016      PT End of Session - 03/19/16 1721    Visit Number 3   Number of Visits 10   Date for PT Re-Evaluation 05/09/16   Authorization Type Medicare Primary, BCBS secondary-GCODE every 10th visit   PT Start Time 1445   PT Stop Time 1529   PT Time Calculation (min) 44 min   Equipment Utilized During Treatment Gait belt   Activity Tolerance Patient tolerated treatment well   Behavior During Therapy [facility] for tasks assessed/performed      Past Medical History:  Diagnosis Date  . Allergy    SEASONAL  . Anemia    Anemia-NOS / PMH, Dr Julien Nordmann  . Arthritis   . Breast cancer ([place])    stage I right breast  . Cataract    BILATERAL  . Decreased vision    R eye  . Esophageal Stricture   . Hyperlipidemia   . Monoclonal gammopathy   . MVP (mitral valve prolapse)   . Osteoporosis   . Parkinson disease ([place])   . Skin cancer    basal cell L neck  . Vertical diplopia     Past Surgical History:  Procedure Laterality Date  . APPENDECTOMY  2002  . BONE BIOPSY  11/06/10  . BREAST LUMPECTOMY  02/2010   right breast lumpectomy and sentinel node biopsy  . CARDIAC CATHETERIZATION  2005   neg  . COLONOSCOPY     negative   . HEMORRHOID SURGERY  1970's  . SALPINGOOPHORECTOMY  2002   for benign growths  . SHOULDER SURGERY Right   . TOTAL ABDOMINAL HYSTERECTOMY W/ BILATERAL SALPINGOOPHORECTOMY  76 yrs old   for pain,prolapse ; G3 P2  . UPPER GASTROINTESTINAL ENDOSCOPY      There were no vitals filed for this visit.      Subjective Assessment - 03/19/16 1447    Subjective No changes, no falls since last visit. Hasn't done exercises because [place]    Pertinent History R rotator cuff surgery, breast cancer, osteoporosis   Patient Stated Goals Pt's goal for therapy is to help to keep from falling.   Currently in Pain? No/denies                         [facility] Adult PT Treatment/Exercise - 03/19/16 1654      Transfers   Transfers Sit to Stand;Stand to Sit   Sit to Stand 5: Supervision;Without upper extremity assist;From bed   Sit to Stand Details (indicate cue type and reason) pt tends to overextend spine as finishing task with shoulders leaning posterior and LOB; vc for correcting posture/balance   Stand to Sit 5: Supervision;Without upper extremity assist;To bed   Number of Reps 10 reps;1 set     Ambulation/Gait   Ambulation/Gait Assistance 5: Supervision   Ambulation/Gait Assistance Details vc for incr arm swing (including back swing), heel strike; step length with good carryover   Ambulation Distance (Feet) 500 Feet   Assistive device None   Gait Pattern Step-through pattern;Decreased arm swing - right;Decreased arm swing - left;Narrow base of support;Trunk flexed;Poor foot clearance - left;Poor foot clearance - right;Decreased trunk rotation   Ambulation  Surface Level;Indoor   Gait Comments worked on sudden stops, changes in direction     Posture/Postural Control   Posture/Postural Control Postural limitations   Postural Limitations Forward head;Rounded Shoulders     High Level Balance   High Level Balance Activities Backward walking;Figure 8 turns;Direction changes;Turns   High Level Balance Comments Forward step and weightshift x 10 reps, then backward step and weightshift x 10 reps; forward<>back step and weightshift x 10 reps; forward and step back with step tap on cone and large step backwards (x 20) min assist for balance; backward stepping on red mat (compliant surface)                PT Education - 03/19/16 1711    Education provided Yes   Education Details HEP    Methods Explanation;Demonstration;Verbal cues   Comprehension Verbalized understanding;Returned demonstration;Verbal cues required             PT Long Term Goals - 03/10/16 2204      PT LONG TERM GOAL #1   Title Pt will be independent with HEP for improved balance, gait and transfers.  TARGET 04/09/16   Time 5   Period Weeks   Status New     PT LONG TERM GOAL #2   Title Pt will improve 5x sit<>stand to less than or equal to 13 seconds for improved transfer efficiency and safety.   Time 5   Period Weeks   Status New     PT LONG TERM GOAL #3   Title Pt will improve MiniBESTest to at least 22/28 for decreased fall risk.   Time 5   Period Weeks   Status New     PT LONG TERM GOAL #4   Title Pt will demo <2 steps to recover balance in posterior push and release test for improved balance recovery in posterior direction.   Time 5   Period Weeks   Status New     PT LONG TERM GOAL #5   Title Pt will verbalize understanding of fall prevention in home environment.   Time 5   Period Weeks   Status New               Plan - 03/19/16 1721    Clinical Impression Statement Skilled session focused on HEP technique, balance techniques focusing on posterior LOB strategies, intensity/quality of gait. Patient with multiple losses of balance throughout session requiring min assist to recover. will continue to work towards [place].   Rehab Potential Good   PT Frequency 2x / week  1x/wk for 1 week, then   PT Duration 4 weeks  plus eval   PT Treatment/Interventions ADLs/Self Care Home Management;Functional mobility training;Gait training;Neuromuscular re-education;Patient/family education;Balance training;Therapeutic exercise;Therapeutic activities   PT Next Visit Plan work on balance strategies in posterior direction, direction changes and turns, likely add forward/back walking to HEP   PT Home Exercise Plan 03/12/16:  sit<>stand transfers, forward step and weigthshift, back step and  weightshift (step strategy)   Consulted and Agree with Plan of Care Patient      Patient will benefit from skilled therapeutic intervention in order to improve the following deficits and impairments:  Abnormal gait, Decreased balance, Decreased mobility, Difficulty walking, Postural dysfunction, Decreased strength  Visit Diagnosis: Other abnormalities of gait and mobility  Unsteadiness on feet     Problem List Patient Active Problem List   Diagnosis Date Noted  . Acute non-recurrent sinusitis 12/30/2015  . Headache 05/11/2015  . Globus sensation 04/24/2015  .  Foot pain 02/25/2015  . Shoulder pain 04/17/2014  . PD (Parkinson's disease) ([place]) 06/05/2013  . Medicare annual wellness visit, subsequent 05/02/2012  . Osteoporosis 03/08/2011  . Breast cancer ([place]) 11/19/2010  . GERD (gastroesophageal reflux disease) 08/01/2008  . DYSPHAGIA UNSPECIFIED 08/01/2008  . ORTHOSTATIC HYPOTENSION 06/28/2008  . HLD (hyperlipidemia) 07/10/2007  . MONOCLONAL GAMMOPATHY 07/10/2007  . ANEMIA-NOS 07/10/2007  . FATIGUE 07/10/2007    Rexanne Mano, PT 03/19/2016, 5:25 PM  [facility] [address] [place], [name], [address] Phone: [phone]   Fax:  [phone]  Name: [name] MRN: [id number] Date of Birth: 10-26-1940

## 2016-03-22 ENCOUNTER — Ambulatory Visit: Payer: Medicare Other | Admitting: Physical Therapy

## 2016-03-22 DIAGNOSIS — R29818 Other symptoms and signs involving the nervous system: Secondary | ICD-10-CM | POA: Diagnosis not present

## 2016-03-22 DIAGNOSIS — R2689 Other abnormalities of gait and mobility: Secondary | ICD-10-CM

## 2016-03-22 DIAGNOSIS — R2681 Unsteadiness on feet: Secondary | ICD-10-CM | POA: Diagnosis not present

## 2016-03-22 DIAGNOSIS — R293 Abnormal posture: Secondary | ICD-10-CM | POA: Diagnosis not present

## 2016-03-22 NOTE — Therapy (Signed)
[facility] [address] [place], [name], [address] Phone: [phone]   Fax:  [phone]  Physical Therapy Treatment  Patient Details  Name: [name] MRN: [id number] Date of Birth: [birth year] Referring Provider: Alonza Bogus  Encounter Date: 03/22/2016      PT End of Session - 03/22/16 1524    Visit Number 4   Number of Visits 10   Date for PT Re-Evaluation 05/09/16   Authorization Type Medicare Primary, BCBS secondary-GCODE every 10th visit   PT Start Time [id number]   PT Stop Time 1524   PT Time Calculation (min) 39 min   Activity Tolerance Patient tolerated treatment well;No increased pain   Behavior During Therapy [facility] for tasks assessed/performed      Past Medical History:  Diagnosis Date  . Allergy    SEASONAL  . Anemia    Anemia-NOS / PMH, Dr Julien Nordmann  . Arthritis   . Breast cancer ([place])    stage I right breast  . Cataract    BILATERAL  . Decreased vision    R eye  . Esophageal Stricture   . Hyperlipidemia   . Monoclonal gammopathy   . MVP (mitral valve prolapse)   . Osteoporosis   . Parkinson disease ([place])   . Skin cancer    basal cell L neck  . Vertical diplopia     Past Surgical History:  Procedure Laterality Date  . APPENDECTOMY  2002  . BONE BIOPSY  11/06/10  . BREAST LUMPECTOMY  02/2010   right breast lumpectomy and sentinel node biopsy  . CARDIAC CATHETERIZATION  2005   neg  . COLONOSCOPY     negative   . HEMORRHOID SURGERY  1970's  . SALPINGOOPHORECTOMY  2002   for benign growths  . SHOULDER SURGERY Right   . TOTAL ABDOMINAL HYSTERECTOMY W/ BILATERAL SALPINGOOPHORECTOMY  76 yrs old   for pain,prolapse ; G3 P2  . UPPER GASTROINTESTINAL ENDOSCOPY      There were no vitals filed for this visit.      Subjective Assessment - 03/22/16 1448    Subjective having some back pain which started yesterday-no known incident.  doing exercises "some"   Pertinent History R  rotator cuff surgery, breast cancer, osteoporosis   Patient Stated Goals Pt's goal for therapy is to help to keep from falling.   Currently in Pain? No/denies  was a "20" this morning                         [facility] Adult PT Treatment/Exercise - 03/22/16 1451      Exercises   Exercises Lumbar     Lumbar Exercises: Stretches   Passive Hamstring Stretch 3 reps;30 seconds   Passive Hamstring Stretch Limitations bil. seated   Single Knee to Chest Stretch 3 reps;30 seconds   Single Knee to Chest Stretch Limitations bil     Lumbar Exercises: Supine   Ab Set 10 reps;5 seconds     Knee/Hip Exercises: Aerobic   Nustep L5 x 8 min, steps > 50           PWR ([facility]) - 03/22/16 1457    PWR! exercises Moves in supine   PWR! Up x20   PWR! Rock x10 bil   PWR! Twist x10 bil   PWR! Step x10 bil   Comments supine                  PT Long Term  Goals - 03/10/16 2204      PT LONG TERM GOAL #1   Title Pt will be independent with HEP for improved balance, gait and transfers.  TARGET 04/09/16   Time 5   Period Weeks   Status New     PT LONG TERM GOAL #2   Title Pt will improve 5x sit<>stand to less than or equal to 13 seconds for improved transfer efficiency and safety.   Time 5   Period Weeks   Status New     PT LONG TERM GOAL #3   Title Pt will improve MiniBESTest to at least 22/28 for decreased fall risk.   Time 5   Period Weeks   Status New     PT LONG TERM GOAL #4   Title Pt will demo <2 steps to recover balance in posterior push and release test for improved balance recovery in posterior direction.   Time 5   Period Weeks   Status New     PT LONG TERM GOAL #5   Title Pt will verbalize understanding of fall prevention in home environment.   Time 5   Period Weeks   Status New               Plan - 03/22/16 1524    Clinical Impression Statement Session today focused on relief of new onset of back pain affecting functional mobility.   Tolerated exercises well without c/o pain, only some pain with transitional movements.  Will continue to benefit from PT to maximize function.   PT Treatment/Interventions ADLs/Self Care Home Management;Functional mobility training;Gait training;Neuromuscular re-education;Patient/family education;Balance training;Therapeutic exercise;Therapeutic activities   PT Next Visit Plan work on balance strategies in posterior direction, direction changes and turns, likely add forward/back walking to HEP; stretching PRN for back pain       Patient will benefit from skilled therapeutic intervention in order to improve the following deficits and impairments:  Abnormal gait, Decreased balance, Decreased mobility, Difficulty walking, Postural dysfunction, Decreased strength  Visit Diagnosis: Other abnormalities of gait and mobility  Unsteadiness on feet  Other symptoms and signs involving the nervous system     Problem List Patient Active Problem List   Diagnosis Date Noted  . Acute non-recurrent sinusitis 12/30/2015  . Headache 05/11/2015  . Globus sensation 04/24/2015  . Foot pain 02/25/2015  . Shoulder pain 04/17/2014  . PD (Parkinson's disease) ([place]) 06/05/2013  . Medicare annual wellness visit, subsequent 05/02/2012  . Osteoporosis 03/08/2011  . Breast cancer ([place]) 11/19/2010  . GERD (gastroesophageal reflux disease) 08/01/2008  . DYSPHAGIA UNSPECIFIED 08/01/2008  . ORTHOSTATIC HYPOTENSION 06/28/2008  . HLD (hyperlipidemia) 07/10/2007  . MONOCLONAL GAMMOPATHY 07/10/2007  . ANEMIA-NOS 07/10/2007  . FATIGUE 07/10/2007      Laureen Abrahams, PT, DPT 03/22/16 3:27 PM    [name] [address] [place], [name], [address] Phone: [phone]   Fax:  [phone]  Name: [name] MRN: [id number] Date of Birth: 06-Dec-1940

## 2016-03-25 ENCOUNTER — Encounter: Payer: Self-pay | Admitting: Physical Therapy

## 2016-03-25 ENCOUNTER — Ambulatory Visit: Payer: Medicare Other | Attending: Neurology | Admitting: Physical Therapy

## 2016-03-25 DIAGNOSIS — R29898 Other symptoms and signs involving the musculoskeletal system: Secondary | ICD-10-CM | POA: Diagnosis not present

## 2016-03-25 DIAGNOSIS — M25611 Stiffness of right shoulder, not elsewhere classified: Secondary | ICD-10-CM | POA: Diagnosis not present

## 2016-03-25 DIAGNOSIS — R29818 Other symptoms and signs involving the nervous system: Secondary | ICD-10-CM | POA: Insufficient documentation

## 2016-03-25 DIAGNOSIS — R278 Other lack of coordination: Secondary | ICD-10-CM | POA: Insufficient documentation

## 2016-03-25 DIAGNOSIS — R2689 Other abnormalities of gait and mobility: Secondary | ICD-10-CM | POA: Diagnosis not present

## 2016-03-25 DIAGNOSIS — R293 Abnormal posture: Secondary | ICD-10-CM | POA: Diagnosis not present

## 2016-03-25 DIAGNOSIS — R2681 Unsteadiness on feet: Secondary | ICD-10-CM | POA: Diagnosis not present

## 2016-03-25 DIAGNOSIS — M25511 Pain in right shoulder: Secondary | ICD-10-CM | POA: Diagnosis not present

## 2016-03-25 NOTE — Therapy (Signed)
[facility] [address] [place], [name], [address] Phone: [phone]   Fax:  [phone]  Physical Therapy Treatment  Patient Details  Name: [name] MRN: [id number] Date of Birth: 1940/07/26 Referring Provider: Alonza Bogus  Encounter Date: 03/25/2016      [name] End of Session - 03/25/16 1710    Visit Number 5   Number of Visits 10   Date for [name] Re-Evaluation 05/09/16   Authorization Type Medicare Primary, BCBS secondary-GCODE every 10th visit   [name] Start Time 9150   [name] Stop Time 1446   [name] Time Calculation (min) 43 min   Activity Tolerance Patient tolerated treatment well;No increased pain   Behavior During Therapy WFL for tasks assessed/performed      Past Medical History:  Diagnosis Date  . Allergy    SEASONAL  . Anemia    Anemia-NOS / PMH, Dr Julien Nordmann  . Arthritis   . Breast cancer ([place])    stage I right breast  . Cataract    BILATERAL  . Decreased vision    R eye  . Esophageal Stricture   . Hyperlipidemia   . Monoclonal gammopathy   . MVP (mitral valve prolapse)   . Osteoporosis   . Parkinson disease ([place])   . Skin cancer    basal cell L neck  . Vertical diplopia     Past Surgical History:  Procedure Laterality Date  . APPENDECTOMY  2002  . BONE BIOPSY  11/06/10  . BREAST LUMPECTOMY  02/2010   right breast lumpectomy and sentinel node biopsy  . CARDIAC CATHETERIZATION  2005   neg  . COLONOSCOPY     negative   . HEMORRHOID SURGERY  1970's  . SALPINGOOPHORECTOMY  2002   for benign growths  . SHOULDER SURGERY Right   . TOTAL ABDOMINAL HYSTERECTOMY W/ BILATERAL SALPINGOOPHORECTOMY  76 yrs old   for pain,prolapse ; G3 P2  . UPPER GASTROINTESTINAL ENDOSCOPY      There were no vitals filed for this visit.      Subjective Assessment - 03/25/16 1407    Subjective Reports back pain is improving, not as bad as the other day.  Still performing exercises.  No falls and no issues.   Pertinent History R rotator cuff surgery, breast cancer, osteoporosis   Patient Stated Goals [name]'s goal for therapy is to help to keep from falling.   Currently in Pain? No/denies            [facility] [name] Assessment - 03/25/16 1443      Transfers   Five time sit to stand comments  9 seconds   Stand to Sit 5: Supervision            Balance Exercises - 03/25/16 1421      Balance Exercises: Standing   Wall Bumps Hip   Wall Bumps-Hips Eyes opened;Anterior/posterior;10 reps  x 3 sets    Retro Gait Upper extremity support;Other reps (comment)  UE support>>no UE support, 6 reps   [facility]one Rotation Solid surface;Right turn;Left turn;A/P;Foam/compliant surface  figure 8 and then 180 turns   Other Standing Exercises Step reaction training backwards and to R and L laterally with focus on stopping with one step on solid surface and then again on compliant surface           [name] Education - 03/25/16 1709    Education provided Yes   Education Details balance strategies    Person(s) Educated Patient   Methods Explanation  Comprehension Verbalized understanding             [name] Long Term Goals - 03/25/16 1445      [name] LONG TERM GOAL #1   Title [name] will be independent with HEP for improved balance, gait and transfers.  TARGET 04/09/16   Time 5   Period Weeks   Status New     [name] LONG TERM GOAL #2   Title [name] will improve 5x sit<>stand to less than or equal to 13 seconds for improved transfer efficiency and safety.   Baseline Met 03/25/2016   Time 5   Period Weeks   Status Achieved     [name] LONG TERM GOAL #3   Title [name] will improve MiniBESTest to at least 22/28 for decreased fall risk.   Time 5   Period Weeks   Status New     [name] LONG TERM GOAL #4   Title [name] will demo <2 steps to recover balance in posterior push and release test for improved balance recovery in posterior direction.   Time 5   Period Weeks   Status New     [name] LONG TERM GOAL #5   Title [name] will verbalize  understanding of fall prevention in home environment.   Time 5   Period Weeks   Status New               Plan - 03/25/16 1711    Clinical Impression Statement Treatment session today focused on various balance strategies (ankle, hip and step) in posterior and lateral directions on various surfaces and continued dynamic balance training during gait.  [name] able to perform five times sit to stand in 9 seconds today.  [name] making good progress and will benefit from ongoing [name] to reach targeted goals.    [name] Treatment/Interventions ADLs/Self Care Home Management;Functional mobility training;Gait training;Neuromuscular re-education;Patient/family education;Balance training;Therapeutic exercise;Therapeutic activities   [name] Next Visit Plan  add forward/back walking to HEP; stretching PRN for back pain, floor transfer?   Consulted and Agree with Plan of Care Patient      Patient will benefit from skilled therapeutic intervention in order to improve the following deficits and impairments:  Abnormal gait, Decreased balance, Decreased mobility, Difficulty walking, Postural dysfunction, Decreased strength  Visit Diagnosis: Other abnormalities of gait and mobility  Unsteadiness on feet     Problem List Patient Active Problem List   Diagnosis Date Noted  . Acute non-recurrent sinusitis 12/30/2015  . Headache 05/11/2015  . Globus sensation 04/24/2015  . Foot pain 02/25/2015  . Shoulder pain 04/17/2014  . PD (Parkinson's disease) ([place]) 06/05/2013  . Medicare annual wellness visit, subsequent 05/02/2012  . Osteoporosis 03/08/2011  . Breast cancer ([employer]) 11/19/2010  . GERD (gastroesophageal reflux disease) 08/01/2008  . DYSPHAGIA UNSPECIFIED 08/01/2008  . ORTHOSTATIC HYPOTENSION 06/28/2008  . HLD (hyperlipidemia) 07/10/2007  . MONOCLONAL GAMMOPATHY 07/10/2007  . ANEMIA-NOS 07/10/2007  . FATIGUE 07/10/2007   [name], [name], [name] 03/25/16    5:15 PM   [facility] [address] [place], [name], [address] Phone: [phone]   Fax:  [phone]  Name: [name] MRN: [id number] Date of Birth: 1940-07-26

## 2016-03-29 ENCOUNTER — Ambulatory Visit: Payer: Medicare Other | Admitting: Physical Therapy

## 2016-03-29 ENCOUNTER — Encounter: Payer: Self-pay | Admitting: Physical Therapy

## 2016-03-29 DIAGNOSIS — R2681 Unsteadiness on feet: Secondary | ICD-10-CM | POA: Diagnosis not present

## 2016-03-29 DIAGNOSIS — R29818 Other symptoms and signs involving the nervous system: Secondary | ICD-10-CM | POA: Diagnosis not present

## 2016-03-29 DIAGNOSIS — M25511 Pain in right shoulder: Secondary | ICD-10-CM | POA: Diagnosis not present

## 2016-03-29 DIAGNOSIS — M25611 Stiffness of right shoulder, not elsewhere classified: Secondary | ICD-10-CM | POA: Diagnosis not present

## 2016-03-29 DIAGNOSIS — R2689 Other abnormalities of gait and mobility: Secondary | ICD-10-CM

## 2016-03-29 DIAGNOSIS — R29898 Other symptoms and signs involving the musculoskeletal system: Secondary | ICD-10-CM | POA: Diagnosis not present

## 2016-03-29 NOTE — Therapy (Signed)
[facility] [address] [place], [place], [address] Phone: [phone]   Fax:  [phone]  Physical Therapy Treatment  Patient Details  Name: [name] MRN: [id number] Date of Birth: [birth year] Referring Provider: Kerin Salen  Encounter Date: 03/29/2016      PT End of Session - 03/29/16 1445    Visit Number 6   Number of Visits 10   Date for PT Re-Evaluation 05/09/16   Authorization Type Medicare Primary, BCBS secondary-GCODE every 10th visit   PT Start Time 1403   PT Stop Time 1444   PT Time Calculation (min) 41 min      Past Medical History:  Diagnosis Date  . Allergy    SEASONAL  . Anemia    Anemia-NOS / PMH, Dr Arbutus Ped  . Arthritis   . Breast cancer ([facility])    stage I right breast  . Cataract    BILATERAL  . Decreased vision    R eye  . Esophageal Stricture   . Hyperlipidemia   . Monoclonal gammopathy   . MVP (mitral valve prolapse)   . Osteoporosis   . Parkinson disease ([facility])   . Skin cancer    basal cell L neck  . Vertical diplopia     Past Surgical History:  Procedure Laterality Date  . APPENDECTOMY  2002  . BONE BIOPSY  11/06/10  . BREAST LUMPECTOMY  02/2010   right breast lumpectomy and sentinel node biopsy  . CARDIAC CATHETERIZATION  2005   neg  . COLONOSCOPY     negative   . HEMORRHOID SURGERY  1970's  . SALPINGOOPHORECTOMY  2002   for benign growths  . SHOULDER SURGERY Right   . TOTAL ABDOMINAL HYSTERECTOMY W/ BILATERAL SALPINGOOPHORECTOMY  76 yrs old   for pain,prolapse ; G3 P2  . UPPER GASTROINTESTINAL ENDOSCOPY      There were no vitals filed for this visit.      Subjective Assessment - 03/29/16 1406    Subjective Back is not that bad.   Pertinent History R rotator cuff surgery, breast cancer, osteoporosis   Patient Stated Goals Pt's goal for therapy is to help to keep from falling.   Currently in Pain? No/denies                          [facility] Adult PT Treatment/Exercise - 03/29/16 0001      Knee/Hip Exercises: Aerobic   Nustep L5 x 8 min, steps > 60; cues to increase speed for  greater challenge and  increased HR.             Balance Exercises - 03/29/16 1407      Balance Exercises: Standing   Standing Eyes Opened Wide (BOA);Foam/compliant surface  standing on balance beam: head turns/nods, Squats progressing onto compliant surface, cues for hip strategies; lateral weight shifts on balance beam.   Step Ups Forward;Intermittent UE support  standing on foam balance beam alt. steps FW/BW           PT Education - 03/29/16 1431    Education provided Yes   Education Details How to gradually increase walking speed for longer periods of time for greater cardiovascular benefit during 60 min walk routine.             PT Long Term Goals - 03/25/16 1445      PT LONG TERM GOAL #1   Title Pt will be independent with HEP for improved  balance, gait and transfers.  TARGET 04/09/16   Time 5   Period Weeks   Status New     PT LONG TERM GOAL #2   Title Pt will improve 5x sit<>stand to less than or equal to 13 seconds for improved transfer efficiency and safety.   Baseline Met 03/25/2016   Time 5   Period Weeks   Status Achieved     PT LONG TERM GOAL #3   Title Pt will improve MiniBESTest to at least 22/28 for decreased fall risk.   Time 5   Period Weeks   Status New     PT LONG TERM GOAL #4   Title Pt will demo <2 steps to recover balance in posterior push and release test for improved balance recovery in posterior direction.   Time 5   Period Weeks   Status New     PT LONG TERM GOAL #5   Title Pt will verbalize understanding of fall prevention in home environment.   Time 5   Period Weeks   Status New               Plan - 03/29/16 1606    Clinical Impression Statement Worked on ankle, hip, and stepping strategies on compliant surface; pt required intermittent UE support and cues for balance  strategies and postural awareness, improved with practised during session.  Updated walking program to progress time with greater walking speed to increase cardiovascular benefits.                                                  PT Treatment/Interventions ADLs/Self Care Home Management;Functional mobility training;Gait training;Neuromuscular re-education;Patient/family education;Balance training;Therapeutic exercise;Therapeutic activities   PT Next Visit Plan  add forward/back walking to HEP; stretching PRN for back pain, floor transfer?   Consulted and Agree with Plan of Care Patient      Patient will benefit from skilled therapeutic intervention in order to improve the following deficits and impairments:  Abnormal gait, Decreased balance, Decreased mobility, Difficulty walking, Postural dysfunction, Decreased strength  Visit Diagnosis: Unsteadiness on feet  Other abnormalities of gait and mobility     Problem List Patient Active Problem List   Diagnosis Date Noted  . Acute non-recurrent sinusitis 12/30/2015  . Headache 05/11/2015  . Globus sensation 04/24/2015  . Foot pain 02/25/2015  . Shoulder pain 04/17/2014  . PD (Parkinson's disease) ([place]) 06/05/2013  . Medicare annual wellness visit, subsequent 05/02/2012  . Osteoporosis 03/08/2011  . Breast cancer ([place]) 11/19/2010  . GERD (gastroesophageal reflux disease) 08/01/2008  . DYSPHAGIA UNSPECIFIED 08/01/2008  . ORTHOSTATIC HYPOTENSION 06/28/2008  . HLD (hyperlipidemia) 07/10/2007  . MONOCLONAL GAMMOPATHY 07/10/2007  . ANEMIA-NOS 07/10/2007  . FATIGUE 07/10/2007   Bjorn Loser, PTA  03/29/16, 4:12 PM [facility] [address] [place], [name], [address] Phone: [phone]   Fax:  [phone]  Name: BELENDA ALVIAR MRN: [id number] Date of Birth: 1940-06-13

## 2016-03-29 NOTE — Patient Instructions (Signed)
Walking Program: During your hr walk try to increase pace during part of the time for cardiovascular benfit.  Begin walking with increased speed for exercise for 5 minutes Progress your increased speed walking program by adding 1-2  minutes to your routine each week, as tolerated. Be sure to wear good walking shoes, walk in a safe environment and only progress to your tolerance.

## 2016-03-31 ENCOUNTER — Ambulatory Visit: Payer: Medicare Other | Admitting: Physical Therapy

## 2016-03-31 DIAGNOSIS — R2681 Unsteadiness on feet: Secondary | ICD-10-CM | POA: Diagnosis not present

## 2016-03-31 DIAGNOSIS — R29818 Other symptoms and signs involving the nervous system: Secondary | ICD-10-CM | POA: Diagnosis not present

## 2016-03-31 DIAGNOSIS — R2689 Other abnormalities of gait and mobility: Secondary | ICD-10-CM

## 2016-03-31 DIAGNOSIS — R29898 Other symptoms and signs involving the musculoskeletal system: Secondary | ICD-10-CM | POA: Diagnosis not present

## 2016-03-31 DIAGNOSIS — R293 Abnormal posture: Secondary | ICD-10-CM

## 2016-03-31 DIAGNOSIS — M25611 Stiffness of right shoulder, not elsewhere classified: Secondary | ICD-10-CM | POA: Diagnosis not present

## 2016-03-31 DIAGNOSIS — M25511 Pain in right shoulder: Secondary | ICD-10-CM | POA: Diagnosis not present

## 2016-03-31 NOTE — Therapy (Signed)
[facility] [address] [place], [name], [address] Phone: [phone]   Fax:  [phone]  Physical Therapy Treatment  Patient Details  Name: [name] MRN: [id number] Date of Birth: [birth year] Referring Provider: Alonza Bogus  Encounter Date: 03/31/2016      PT End of Session - 03/31/16 1437    Visit Number 7   Number of Visits 10   Date for PT Re-Evaluation 05/09/16   Authorization Type Medicare Primary, BCBS secondary-GCODE every 10th visit   PT Start Time 1400   PT Stop Time 1440   PT Time Calculation (min) 40 min   Equipment Utilized During Treatment Gait belt   Activity Tolerance Patient tolerated treatment well   Behavior During Therapy [facility] for tasks assessed/performed      Past Medical History:  Diagnosis Date  . Allergy    SEASONAL  . Anemia    Anemia-NOS / PMH, Dr Julien Nordmann  . Arthritis   . Breast cancer ([place])    stage I right breast  . Cataract    BILATERAL  . Decreased vision    R eye  . Esophageal Stricture   . Hyperlipidemia   . Monoclonal gammopathy   . MVP (mitral valve prolapse)   . Osteoporosis   . Parkinson disease ([place])   . Skin cancer    basal cell L neck  . Vertical diplopia     Past Surgical History:  Procedure Laterality Date  . APPENDECTOMY  2002  . BONE BIOPSY  11/06/10  . BREAST LUMPECTOMY  02/2010   right breast lumpectomy and sentinel node biopsy  . CARDIAC CATHETERIZATION  2005   neg  . COLONOSCOPY     negative   . HEMORRHOID SURGERY  1970's  . SALPINGOOPHORECTOMY  2002   for benign growths  . SHOULDER SURGERY Right   . TOTAL ABDOMINAL HYSTERECTOMY W/ BILATERAL SALPINGOOPHORECTOMY  76 yrs old   for pain,prolapse ; G3 P2  . UPPER GASTROINTESTINAL ENDOSCOPY      There were no vitals filed for this visit.      Subjective Assessment - 03/31/16 1402    Subjective brother in hospital past few day, doing exercises every day.   Pertinent History R  rotator cuff surgery, breast cancer, osteoporosis   Patient Stated Goals Pt's goal for therapy is to help to keep from falling.   Currently in Pain? No/denies                         [facility] Adult PT Treatment/Exercise - 03/31/16 1413      Transfers   Transfers Floor to Transfer   Floor to Transfer 6: Modified independent (Device/Increase time);5: Supervision     Knee/Hip Exercises: Aerobic   Nustep L6 x 5 min, steps > 60; cues to increase speed for  greater challenge and to increase HR           [facility]) - 03/31/16 1413    PWR! exercises Moves in standing   PWR! Up x20   PWR! Rock x10 bil   PWR! Twist x10 bil   PWR Step x10 bil   Comments standing; min R knee with step to R          Balance Exercises - 03/31/16 1424      Balance Exercises: Standing   Stepping Strategy Posterior;10 reps  bil with min A and cues for postural correction   Rockerboard Anterior/posterior;EC;20 seconds;5 reps  no UE  support needed; min cues for posture   Retro Gait 5 reps  10'x5           PT Education - 03/31/16 1437    Education provided Yes   Education Details PWR! standing   Person(s) Educated Patient   Methods Explanation;Demonstration;Handout   Comprehension Verbalized understanding;Returned demonstration             PT Long Term Goals - 03/25/16 1445      PT LONG TERM GOAL #1   Title Pt will be independent with HEP for improved balance, gait and transfers.  TARGET 04/09/16   Time 5   Period Weeks   Status New     PT LONG TERM GOAL #2   Title Pt will improve 5x sit<>stand to less than or equal to 13 seconds for improved transfer efficiency and safety.   Baseline Met 03/25/2016   Time 5   Period Weeks   Status Achieved     PT LONG TERM GOAL #3   Title Pt will improve MiniBESTest to at least 22/28 for decreased fall risk.   Time 5   Period Weeks   Status New     PT LONG TERM GOAL #4   Title Pt will demo <2 steps to recover balance in  posterior push and release test for improved balance recovery in posterior direction.   Time 5   Period Weeks   Status New     PT LONG TERM GOAL #5   Title Pt will verbalize understanding of fall prevention in home environment.   Time 5   Period Weeks   Status New               Plan - 03/31/16 1438    Clinical Impression Statement Pt tolerated standing PWR! moves today and added to HEP (pt unsure if she has these).  Continues to need min cues for postural awareness as pt tends to have posterior lean.  Supervision with floor transfers today.  Anticipate pt will be ready for d/c next week, but will plan to assess goals and determine at next sessions.   PT Next Visit Plan begin checking LTGs, d/c v/s renew   Consulted and Agree with Plan of Care Patient      Patient will benefit from skilled therapeutic intervention in order to improve the following deficits and impairments:  Abnormal gait, Decreased balance, Decreased mobility, Difficulty walking, Postural dysfunction, Decreased strength  Visit Diagnosis: Unsteadiness on feet  Other abnormalities of gait and mobility  Other symptoms and signs involving the nervous system  Abnormal posture     Problem List Patient Active Problem List   Diagnosis Date Noted  . Acute non-recurrent sinusitis 12/30/2015  . Headache 05/11/2015  . Globus sensation 04/24/2015  . Foot pain 02/25/2015  . Shoulder pain 04/17/2014  . PD (Parkinson's disease) ([place]) 06/05/2013  . Medicare annual wellness visit, subsequent 05/02/2012  . Osteoporosis 03/08/2011  . Breast cancer ([place]) 11/19/2010  . GERD (gastroesophageal reflux disease) 08/01/2008  . DYSPHAGIA UNSPECIFIED 08/01/2008  . ORTHOSTATIC HYPOTENSION 06/28/2008  . HLD (hyperlipidemia) 07/10/2007  . MONOCLONAL GAMMOPATHY 07/10/2007  . ANEMIA-NOS 07/10/2007  . FATIGUE 07/10/2007      Laureen Abrahams, PT, DPT 03/31/16 2:44 PM    [facility] [address] [place], [name], [address] Phone: [phone]   Fax:  [phone]  Name: [name] MRN: [id number] Date of Birth: 1940/10/23

## 2016-04-01 ENCOUNTER — Ambulatory Visit: Payer: Medicare Other | Admitting: Occupational Therapy

## 2016-04-01 ENCOUNTER — Telehealth: Payer: Self-pay | Admitting: Neurology

## 2016-04-01 ENCOUNTER — Ambulatory Visit: Payer: Medicare Other

## 2016-04-01 DIAGNOSIS — G20 Parkinson's disease: Secondary | ICD-10-CM

## 2016-04-01 DIAGNOSIS — G20A1 Parkinson's disease without dyskinesia, without mention of fluctuations: Secondary | ICD-10-CM

## 2016-04-01 NOTE — Telephone Encounter (Signed)
-----   Message from Laureen Abrahams, PT sent at 03/31/2016  4:15 PM EST ----- Dr. Carles ColletEdwena Bunde been seeing [facility] for [facility] and feel she will benefit from OT to help with UE weakness and difficulty with ADLs due to Parkinson's.  If you agree, will you please send an order to the [facility] work queue?  We also plan to monitor and screen for speech therapy needs, and will let you know if she needs these services as well.  Thanks so much! Laureen Abrahams, PT, DPT 03/31/16 4:19 PM

## 2016-04-01 NOTE — Telephone Encounter (Signed)
Order entered

## 2016-04-05 ENCOUNTER — Other Ambulatory Visit: Payer: Medicare Other

## 2016-04-06 ENCOUNTER — Ambulatory Visit: Payer: Medicare Other | Admitting: Physical Therapy

## 2016-04-06 ENCOUNTER — Other Ambulatory Visit (HOSPITAL_BASED_OUTPATIENT_CLINIC_OR_DEPARTMENT_OTHER): Payer: Medicare Other

## 2016-04-06 ENCOUNTER — Telehealth: Payer: Self-pay | Admitting: Internal Medicine

## 2016-04-06 DIAGNOSIS — R2681 Unsteadiness on feet: Secondary | ICD-10-CM | POA: Diagnosis not present

## 2016-04-06 DIAGNOSIS — M25611 Stiffness of right shoulder, not elsewhere classified: Secondary | ICD-10-CM | POA: Diagnosis not present

## 2016-04-06 DIAGNOSIS — D472 Monoclonal gammopathy: Secondary | ICD-10-CM

## 2016-04-06 DIAGNOSIS — R2689 Other abnormalities of gait and mobility: Secondary | ICD-10-CM

## 2016-04-06 DIAGNOSIS — R293 Abnormal posture: Secondary | ICD-10-CM

## 2016-04-06 DIAGNOSIS — R29898 Other symptoms and signs involving the musculoskeletal system: Secondary | ICD-10-CM | POA: Diagnosis not present

## 2016-04-06 DIAGNOSIS — M25511 Pain in right shoulder: Secondary | ICD-10-CM | POA: Diagnosis not present

## 2016-04-06 DIAGNOSIS — R29818 Other symptoms and signs involving the nervous system: Secondary | ICD-10-CM | POA: Diagnosis not present

## 2016-04-06 DIAGNOSIS — C50411 Malignant neoplasm of upper-outer quadrant of right female breast: Secondary | ICD-10-CM

## 2016-04-06 LAB — CBC WITH DIFFERENTIAL/PLATELET
BASO%: 0.3 % (ref 0.0–2.0)
Basophils Absolute: 0 10*3/uL (ref 0.0–0.1)
EOS%: 2.5 % (ref 0.0–7.0)
Eosinophils Absolute: 0.2 10*3/uL (ref 0.0–0.5)
HCT: 37.7 % (ref 34.8–46.6)
HGB: 12.4 g/dL (ref 11.6–15.9)
LYMPH%: 28.7 % (ref 14.0–49.7)
MCH: 32.4 pg (ref 25.1–34.0)
MCHC: 32.9 g/dL (ref 31.5–36.0)
MCV: 98.4 fL (ref 79.5–101.0)
MONO#: 0.3 10*3/uL (ref 0.1–0.9)
MONO%: 3.9 % (ref 0.0–14.0)
NEUT#: 4.9 10*3/uL (ref 1.5–6.5)
NEUT%: 64.6 % (ref 38.4–76.8)
Platelets: 210 10*3/uL (ref 145–400)
RBC: 3.83 10*6/uL (ref 3.70–5.45)
RDW: 14.4 % (ref 11.2–14.5)
WBC: 7.6 10*3/uL (ref 3.9–10.3)
lymph#: 2.2 10*3/uL (ref 0.9–3.3)
nRBC: 0 % (ref 0–0)

## 2016-04-06 LAB — COMPREHENSIVE METABOLIC PANEL
ALT: 16 U/L (ref 0–55)
AST: 29 U/L (ref 5–34)
Albumin: 3.7 g/dL (ref 3.5–5.0)
Alkaline Phosphatase: 46 U/L (ref 40–150)
Anion Gap: 10 mEq/L (ref 3–11)
BUN: 14.4 mg/dL (ref 7.0–26.0)
CO2: 29 mEq/L (ref 22–29)
Calcium: 9.3 mg/dL (ref 8.4–10.4)
Chloride: 105 mEq/L (ref 98–109)
Creatinine: 0.8 mg/dL (ref 0.6–1.1)
EGFR: 76 mL/min/{1.73_m2} — ABNORMAL LOW (ref >90)
Glucose: 88 mg/dl (ref 70–140)
Potassium: 3.9 mEq/L (ref 3.5–5.1)
Sodium: 144 mEq/L (ref 136–145)
Total Bilirubin: 0.36 mg/dL (ref 0.20–1.20)
Total Protein: 7.3 g/dL (ref 6.4–8.3)

## 2016-04-06 LAB — LACTATE DEHYDROGENASE: LDH: 180 U/L (ref 125–245)

## 2016-04-06 NOTE — Patient Instructions (Signed)
It is important to avoid accidents which may result in broken bones.  Here are a few ideas on how to make your home safer so you will be less likely to trip or fall.  1. Use nonskid mats or non slip strips in your shower or tub, on your bathroom floor and around sinks.  If you know that you have spilled water, wipe it up! 2. In the bathroom, it is important to have properly installed grab bars on the walls or on the edge of the tub.  Towel racks are NOT strong enough for you to hold onto or to pull on for support. 3. Stairs and hallways should have enough light.  Add lamps or night lights if you need ore light. 4. It is good to have handrails on both sides of the stairs if possible.  Always fix broken handrails right away. 5. It is important to see the edges of steps.  Paint the edges of outdoor steps white so you can see them better.  Put colored tape on the edge of inside steps. 6. Throw-rugs are dangerous because they can slide.  Removing the rugs is the best idea, but if they must stay, add adhesive carpet tape to prevent slipping. 7. Do not keep things on stairs or in the halls.  Remove small furniture that blocks the halls as it may cause you to trip.  Keep telephone and electrical cords out of the way where you walk. 8. Always were sturdy, rubber-soled shoes for good support.  Never wear just socks, especially on the stairs.  Socks may cause you to slip or fall.  Do not wear full-length housecoats as you can easily trip on the bottom.  9. Place the things you use the most on the shelves that are the easiest to reach.  If you use a stepstool, make sure it is in good condition.  If you feel unsteady, DO NOT climb, ask for help. 10. If a health professional advises you to use a cane or walker, do not be ashamed.  These items can keep you from falling and breaking your bones.    WAYS TO PROGRESS YOUR PWR! MOVES IN STANDING: -PWR! Moves FLOW in standing:  PWR! Up>PWR! Rock>PWR! Twist>PWR! Step x 10  reps  -Perform PWR! Moves while standing on pillow or towel, at counter for UE support

## 2016-04-06 NOTE — Therapy (Signed)
[facility] [address] [place], [name], [address] Phone: [phone]   Fax:  [phone]  Physical Therapy Treatment  Patient Details  Name: [name] MRN: [id number] Date of Birth: [birth year] Referring Provider: Alonza Bogus  Encounter Date: 04/06/2016      PT End of Session - 04/06/16 1658    Visit Number 8   Number of Visits 10   Date for PT Re-Evaluation 05/09/16   Authorization Type Medicare Primary, BCBS secondary-GCODE every 10th visit   PT Start Time 1317   PT Stop Time 1402   PT Time Calculation (min) 45 min   Activity Tolerance Patient tolerated treatment well   Behavior During Therapy [facility] for tasks assessed/performed      Past Medical History:  Diagnosis Date  . Allergy    SEASONAL  . Anemia    Anemia-NOS / PMH, Dr Julien Nordmann  . Arthritis   . Breast cancer ([place])    stage I right breast  . Cataract    BILATERAL  . Decreased vision    R eye  . Esophageal Stricture   . Hyperlipidemia   . Monoclonal gammopathy   . MVP (mitral valve prolapse)   . Osteoporosis   . Parkinson disease ([place])   . Skin cancer    basal cell L neck  . Vertical diplopia     Past Surgical History:  Procedure Laterality Date  . APPENDECTOMY  2002  . BONE BIOPSY  11/06/10  . BREAST LUMPECTOMY  02/2010   right breast lumpectomy and sentinel node biopsy  . CARDIAC CATHETERIZATION  2005   neg  . COLONOSCOPY     negative   . HEMORRHOID SURGERY  1970's  . SALPINGOOPHORECTOMY  2002   for benign growths  . SHOULDER SURGERY Right   . TOTAL ABDOMINAL HYSTERECTOMY W/ BILATERAL SALPINGOOPHORECTOMY  76 yrs old   for pain,prolapse ; G3 P2  . UPPER GASTROINTESTINAL ENDOSCOPY      There were no vitals filed for this visit.      Subjective Assessment - 04/06/16 1320    Subjective Able to get on and off commode better; have not fallen   Pertinent History R rotator cuff surgery, breast cancer, osteoporosis   Patient Stated Goals Pt's goal for therapy is to help to keep from falling.   Currently in Pain? No/denies                         [facility] Adult PT Treatment/Exercise - 04/06/16 0001      Transfers   Transfers Sit to Stand;Stand to Sit   Sit to Stand 7: Independent;Without upper extremity assist;From chair/3-in-1   Five time sit to stand comments  13.29    Stand to Sit 7: Independent;Without upper extremity assist;To chair/3-in-1   Number of Reps Other sets (comment);Other reps (comment)  2 sets of 5 reps     Ambulation/Gait   Gait velocity 8.98 sec = 3.65 ft/sec     High Level Balance   High Level Balance Comments MiniBESTest:  26/28 (improved from 17/28).  Posterior push and release:  pt takes one step posterior/lateral and recovers balance.     Self-Care   Self-Care Other Self-Care Comments   Other Self-Care Comments  Discussed fall prevention in home environment, and also with applications to pt's upcoming trip to [facility]; discussed POC and progress towards goals, with pt in agreement to d/c this visit; discussed return PT screen/eval if changes occur  in functional mobility or with falls.  pt has OT eval in [facility] and is in agreement to schedule today.           [facility]) - 04/06/16 1653    PWR! exercises Moves in standing   PWR! Up x 20   PWR! Rock x 10 bilat   PWR! Twist x 10 bilat   PWR Step x 10 bilat, each side, forward, back directions   Basic 4 Flow Pt performs standing PWR! Moves Flow x 3 reps each side.   Comments Review of standing PWR! Moves for HEP; pt return demo understanding with min verbal cues.    PWR Moves standing on foam, each position x 5      Mini-BESTest: Balance Evaluation Systems Test  2005-2013 [employer]. All rights reserved. ________________________________________________________________________________________Anticipatory_________Subscore__5___/6 1. SIT TO STAND Instruction: "Cross your arms across  your chest. Try not to use your hands unless you must.Do not let your legs lean against the back of the chair when you stand. Please stand up now." X(2) Normal: Comes to stand without use of hands and stabilizes independently. (1) Moderate: Comes to stand WITH use of hands on first attempt. (0) Severe: Unable to stand up from chair without assistance, OR needs several attempts with use of hands. 2. RISE TO TOES Instruction: "Place your feet shoulder width apart. Place your hands on your hips. Try to rise as high as you can onto your toes. I will count out loud to 3 seconds. Try to hold this pose for at least 3 seconds. Look straight ahead. Rise now." X(2) Normal: Stable for 3 s with maximum height. (1) Moderate: Heels up, but not full range (smaller than when holding hands), OR noticeable instability for 3 s. (0) Severe: < 3 s. 3. STAND ON ONE LEG Instruction: "Look straight ahead. Keep your hands on your hips. Lift your leg off of the ground behind you without touching or resting your raised leg upon your other standing leg. Stay standing on one leg as long as you can. Look straight ahead. Lift now." Left: Time in Seconds Trial 1:__12.12___Trial 2:__20___ X(2) Normal: 20 s. (1) Moderate: < 20 s. (0) Severe: Unable. Right: Time in Seconds Trial 1:_3.75____Trial 2:___20(0) Severe: Unable To score each side separately use the trial with the longest time. To calculate the sub-score and total score use the side [left or right] with the lowest numerical score [i.e. the worse side]. ______________________________________________________________________________________Reactive Postural Control___________Subscore:___6__/6 4. COMPENSATORY STEPPING CORRECTION- FORWARD Instruction: "Stand with your feet shoulder width apart, arms at your sides. Lean forward against my hands beyond your forward limits. When I let go, do whatever is necessary, including taking a step, to avoid a fall." X(2) Normal:  Recovers independently with a single, large step (second realignment step is allowed). (1) Moderate: More than one step used to recover equilibrium. (0) Severe: No step, OR would fall if not caught, OR falls spontaneously. 5. COMPENSATORY STEPPING CORRECTION- BACKWARD Instruction: "Stand with your feet shoulder width apart, arms at your sides. Lean backward against my hands beyond your backward limits. When I let go, do whatever is necessary, including taking a step, to avoid a fall." X(2) Normal: Recovers independently with a single, large step. (1) Moderate: More than one step used to recover equilibrium. (0) Severe: No step, OR would fall if not caught, OR falls spontaneously. 6. COMPENSATORY STEPPING CORRECTION- LATERAL Instruction: "Stand with your feet together, arms down at your sides. Lean into my hand beyond your sideways  limit. When I let go, do whatever is necessary, including taking a step, to avoid a fall." Left X(2) Normal: Recovers independently with 1 step (crossover or lateral OK). (1) Moderate: Several steps to recover equilibrium. (0) Severe: Falls, or cannot step. Right X(2) Normal: Recovers independently with 1 step (crossover or lateral OK). (1) Moderate: Several steps to recover equilibrium. (0) Severe: Falls, or cannot step. Use the side with the lowest score to calculate sub-score and total score. ____________________________________________________________________________________Sensory Orientation_____________Subscore:_____6____/6 7. STANCE (FEET TOGETHER); EYES OPEN, FIRM SURFACE Instruction: "Place your hands on your hips. Place your feet together until almost touching. Look straight ahead. Be as stable and still as possible, until I say stop." Time in seconds:________ X(2) Normal: 30 s. (1) Moderate: < 30 s. (0) Severe: Unable. 8. STANCE (FEET TOGETHER); EYES CLOSED, FOAM SURFACE Instruction: "Step onto the foam. Place your hands on your hips. Place your  feet together until almost touching. Be as stable and still as possible, until I say stop. I will start timing when you close your eyes." Time in seconds:________ X(2) Normal: 30 s. (1) Moderate: < 30 s. (0) Severe: Unable. 9. INCLINE- EYES CLOSED Instruction: "Step onto the incline ramp. Please stand on the incline ramp with your toes toward the top. Place your feet shoulder width apart and have your arms down at your sides. I will start timing when you close your eyes." Time in seconds:________ X(2) Normal: Stands independently 30 s and aligns with gravity. (1) Moderate: Stands independently <30 s OR aligns with surface. (0) Severe: Unable. _________________________________________________________________________________________Dynamic Gait ______Subscore____9____/10 10. CHANGE IN GAIT SPEED Instruction: "Begin walking at your normal speed, when I tell you 'fast', walk as fast as you can. When I say 'slow', walk very slowly." X(2) Normal: Significantly changes walking speed without imbalance. (1) Moderate: Unable to change walking speed or signs of imbalance. (0) Severe: Unable to achieve significant change in walking speed AND signs of imbalance. [place] - HORIZONTAL Instruction: "Begin walking at your normal speed, when I say "right", turn your head and look to the right. When I say "left" turn your head and look to the left. Try to keep yourself walking in a straight line." (2) Normal: performs head turns with no change in gait speed and good balance. X(1) Moderate: performs head turns with reduction in gait speed. (0) Severe: performs head turns with imbalance. 12. WALK WITH PIVOT TURNS Instruction: "Begin walking at your normal speed. When I tell you to 'turn and stop', turn as quickly as you can, face the opposite direction, and stop. After the turn, your feet should be close together." X(2) Normal: Turns with feet close FAST (< 3 steps) with good balance. (1)  Moderate: Turns with feet close SLOW (>4 steps) with good balance. (0) Severe: Cannot turn with feet close at any speed without imbalance. 13. STEP OVER OBSTACLES Instruction: "Begin walking at your normal speed. When you get to the box, step over it, not around it and keep walking." X(2) Normal: Able to step over box with minimal change of gait speed and with good balance. (1) Moderate: Steps over box but touches box OR displays cautious behavior by slowing gait. (0) Severe: Unable to step over box OR steps around box. 14. TIMED UP & GO WITH DUAL TASK [3 METER WALK] Instruction TUG: "When I say 'Go', stand up from chair, walk at your normal speed across the tape on the floor, turn around, and come back to sit in the chair."  Instruction TUG with Dual Task: "Count backwards by threes starting at ___. When I say 'Go', stand up from chair, walk at your normal speed across the tape on the floor, turn around, and come back to sit in the chair. Continue counting backwards the entire time." TUG: ____8.78____seconds; Dual Task TUG: ____8.57____seconds X(2) Normal: No noticeable change in sitting, standing or walking while backward counting when compared to TUG without Dual Task. (1) Moderate: Dual Task affects either counting OR walking (>10%) when compared to the TUG without Dual Task. (0) Severe: Stops counting while walking OR stops walking while counting. When scoring item 14, if subject's gait speed slows more than 10% between the TUG without and with a Dual Task the score should be decreased by a point. TOTAL SCORE: _______26_/28        PT Education - Apr 23, 2016 1658    Education provided Yes   Education Details Review of HEP, fall prevention, POC, plans for discharge this visit.   Person(s) Educated Patient   Methods Explanation;Handout   Comprehension Verbalized understanding             PT Long Term Goals - Apr 23, 2016 1334      PT LONG TERM GOAL #1   Title Pt will be  independent with HEP for improved balance, gait and transfers.  TARGET 04/09/16   Time 5   Period Weeks   Status Achieved     PT LONG TERM GOAL #2   Title Pt will improve 5x sit<>stand to less than or equal to 13 seconds for improved transfer efficiency and safety.   Baseline Met 03/25/2016   Time 5   Period Weeks   Status Achieved     PT LONG TERM GOAL #3   Title Pt will improve MiniBESTest to at least 22/28 for decreased fall risk.   Baseline score 26/28 on 04/23/2016   Time 5   Period Weeks   Status Achieved     PT LONG TERM GOAL #4   Title Pt will demo <2 steps to recover balance in posterior push and release test for improved balance recovery in posterior direction.   Time 5   Period Weeks   Status Achieved     PT LONG TERM GOAL #5   Title Pt will verbalize understanding of fall prevention in home environment.   Time 5   Period Weeks   Status Achieved               Plan - 23-Apr-2016 1659    Clinical Impression Statement Pt has met all LTGs and is appropriate for d/c from PT this visit (she will be out of town later this week).  Discussed/demo ways to challenge and progress current PWR! Moves as part of HEP.  Pt reports no falls since starting therapy and is appropriate for discharge at this time.   PT Frequency 2x / week  1x/wk for 1 week, then   PT Duration 4 weeks   PT Next Visit Plan Discharge this visit; Return PD screen in 6-9 months   Consulted and Agree with Plan of Care Patient      Patient will benefit from skilled therapeutic intervention in order to improve the following deficits and impairments:  Abnormal gait, Decreased balance, Decreased mobility, Difficulty walking, Postural dysfunction, Decreased strength  Visit Diagnosis: Abnormal posture  Unsteadiness on feet  Other abnormalities of gait and mobility       G-Codes - 2016-04-23 1702    Functional Assessment Tool Used (Outpatient  Only) 5x sit<>stand 13.29 sec (9 sec at best); MiniBESTest  26/28, no falls in past month   Functional Limitation Mobility: Walking and moving around   Mobility: Walking and Moving Around Goal Status [id number]) At least 1 percent but less than 20 percent impaired, limited or restricted   Mobility: Walking and Moving Around Discharge Status [phone]) At least 1 percent but less than 20 percent impaired, limited or restricted      Problem List Patient Active Problem List   Diagnosis Date Noted  . Acute non-recurrent sinusitis 12/30/2015  . Headache 05/11/2015  . Globus sensation 04/24/2015  . Foot pain 02/25/2015  . Shoulder pain 04/17/2014  . PD (Parkinson's disease) ([place]) 06/05/2013  . Medicare annual wellness visit, subsequent 05/02/2012  . Osteoporosis 03/08/2011  . Breast cancer ([place]) 11/19/2010  . GERD (gastroesophageal reflux disease) 08/01/2008  . DYSPHAGIA UNSPECIFIED 08/01/2008  . ORTHOSTATIC HYPOTENSION 06/28/2008  . HLD (hyperlipidemia) 07/10/2007  . MONOCLONAL GAMMOPATHY 07/10/2007  . ANEMIA-NOS 07/10/2007  . FATIGUE 07/10/2007    MARRIOTT,AMY W. 04/06/2016, 5:04 PM  Frazier Butt., PT  [facility] [address] [place], [name], [address] Phone: [phone]   Fax:  [phone]  Name: [name] MRN: [id number] Date of Birth: 09-Dec-1940   PHYSICAL THERAPY DISCHARGE SUMMARY  Visits from Start of Care: 8  Current functional level related to goals / functional outcomes:     PT Long Term Goals - 04/06/16 1334      PT LONG TERM GOAL #1   Title Pt will be independent with HEP for improved balance, gait and transfers.  TARGET 04/09/16   Time 5   Period Weeks   Status Achieved     PT LONG TERM GOAL #2   Title Pt will improve 5x sit<>stand to less than or equal to 13 seconds for improved transfer efficiency and safety.   Baseline Met 03/25/2016   Time 5   Period Weeks   Status Achieved     PT LONG TERM GOAL #3   Title Pt will improve MiniBESTest to at  least 22/28 for decreased fall risk.   Baseline score 26/28 on 04/06/16   Time 5   Period Weeks   Status Achieved     PT LONG TERM GOAL #4   Title Pt will demo <2 steps to recover balance in posterior push and release test for improved balance recovery in posterior direction.   Time 5   Period Weeks   Status Achieved     PT LONG TERM GOAL #5   Title Pt will verbalize understanding of fall prevention in home environment.   Time 5   Period Weeks   Status Achieved       Remaining deficits: Posture, balance, bradykinesia , all improving-   Education / Equipment: Educated in [employer], fall prevention  Plan: Patient agrees to discharge.  Patient goals were met. Patient is being discharged due to meeting the stated rehab goals.  ?????Pt would benefit from return PD screen in 6-9 months.   Mady Haagensen, PT 04/06/16 5:11 PM Phone: [phone] Fax: [phone]

## 2016-04-06 NOTE — Telephone Encounter (Signed)
Called patient to r/s lab appt due to inclement weather. Patient is aware of new date and time of lab appt.

## 2016-04-07 LAB — BETA 2 MICROGLOBULIN, SERUM: Beta-2: 1.6 mg/L (ref 0.6–2.4)

## 2016-04-07 LAB — IGG, IGA, IGM
IgA, Qn, Serum: 35 mg/dL — ABNORMAL LOW (ref 64–422)
IgG, Qn, Serum: 1639 mg/dL — ABNORMAL HIGH (ref 700–1600)
IgM, Qn, Serum: 37 mg/dL (ref 26–217)

## 2016-04-07 LAB — KAPPA/LAMBDA LIGHT CHAINS
Ig Kappa Free Light Chain: 54.3 mg/L — ABNORMAL HIGH (ref 3.3–19.4)
Ig Lambda Free Light Chain: 9.5 mg/L (ref 5.7–26.3)
Kappa/Lambda FluidC Ratio: 5.72 — ABNORMAL HIGH (ref 0.26–1.65)

## 2016-04-08 ENCOUNTER — Ambulatory Visit: Payer: Medicare Other | Admitting: Physical Therapy

## 2016-04-08 LAB — CANCER ANTIGEN 27.29: CA 27.29: 21.1 U/mL (ref 0.0–38.6)

## 2016-04-09 ENCOUNTER — Other Ambulatory Visit: Payer: Self-pay | Admitting: *Deleted

## 2016-04-12 ENCOUNTER — Ambulatory Visit (HOSPITAL_BASED_OUTPATIENT_CLINIC_OR_DEPARTMENT_OTHER): Payer: Medicare Other | Admitting: Internal Medicine

## 2016-04-12 ENCOUNTER — Telehealth: Payer: Self-pay | Admitting: Internal Medicine

## 2016-04-12 ENCOUNTER — Ambulatory Visit (HOSPITAL_BASED_OUTPATIENT_CLINIC_OR_DEPARTMENT_OTHER): Payer: Medicare Other

## 2016-04-12 ENCOUNTER — Other Ambulatory Visit: Payer: Self-pay | Admitting: Medical Oncology

## 2016-04-12 ENCOUNTER — Encounter: Payer: Self-pay | Admitting: Internal Medicine

## 2016-04-12 VITALS — BP 122/66 | HR 81 | Temp 98.3°F | Resp 18 | Ht 65.0 in | Wt 130.7 lb

## 2016-04-12 DIAGNOSIS — M810 Age-related osteoporosis without current pathological fracture: Secondary | ICD-10-CM

## 2016-04-12 DIAGNOSIS — Z170 Estrogen receptor positive status [ER+]: Secondary | ICD-10-CM

## 2016-04-12 DIAGNOSIS — Z853 Personal history of malignant neoplasm of breast: Secondary | ICD-10-CM

## 2016-04-12 DIAGNOSIS — D472 Monoclonal gammopathy: Secondary | ICD-10-CM

## 2016-04-12 DIAGNOSIS — C50411 Malignant neoplasm of upper-outer quadrant of right female breast: Secondary | ICD-10-CM

## 2016-04-12 MED ORDER — DENOSUMAB 60 MG/ML ~~LOC~~ SOLN
60.0000 mg | Freq: Once | SUBCUTANEOUS | Status: AC
  Administered 2016-04-12: 60 mg via SUBCUTANEOUS
  Filled 2016-04-12: qty 1

## 2016-04-12 MED ORDER — DENOSUMAB 60 MG/ML ~~LOC~~ SOLN
60.0000 mg | Freq: Once | SUBCUTANEOUS | Status: DC
  Filled 2016-04-12: qty 1

## 2016-04-12 NOTE — Progress Notes (Signed)
[facility] [phone]   [phone]  OFFICE PROGRESS NOTE  [name], MD [name] [address]  DIAGNOSIS:  1) Node-negative breast cancer status post completion of radiation 05/06/2010 currently on tamoxifen as well as prolia.  2) Monoclonal gammopathy of unknown significance on observation since 2007   PRIOR THERAPY:  1) status post right lumpectomy on 03/05/2010 and it showed invasive ductal carcinoma 0.9 CM, Positive for ER/PR and negative for HER-2, with ductal carcinoma in situ present and negative sentinel lymph node biopsies.  2) status post adjuvant radiotherapy completed in April of 2012   CURRENT THERAPY:  1) tamoxifen 20 mg by mouth daily started in 2012.  2) Prolia subcutaneous injection every 6 months.  INTERVAL HISTORY: [name] 76 y.o. female came to the clinic today for six-month follow-up visit. The patient has no complaints today. She denied having any chest pain, shortness of breath, cough or hemoptysis. She denied having any weight loss or night sweats. She has no nausea, vomiting, diarrhea or constipation. She is tolerating her treatment with tamoxifen well. She had repeat myeloma panel performed recently and she is here for evaluation and discussion of her lab results.   MEDICAL HISTORY: Past Medical History:  Diagnosis Date  . Allergy    SEASONAL  . Anemia    Anemia-NOS / PMH, Dr Julien Nordmann  . Arthritis   . Breast cancer ([place])    stage I right breast  . Cataract    BILATERAL  . Decreased vision    R eye  . Esophageal Stricture   . Hyperlipidemia   . Monoclonal gammopathy   . MVP (mitral valve prolapse)   . Osteoporosis   . Parkinson disease ([place])   . Skin cancer    basal cell L neck  . Vertical diplopia     ALLERGIES:  is allergic to penicillins.  MEDICATIONS:  Current Outpatient Prescriptions  Medication Sig Dispense Refill  . aspirin 81 MG tablet Take 81 mg by  mouth daily.      [name] b complex vitamins tablet Take 1 tablet by mouth daily.    . carbidopa-levodopa (SINEMET IR) 25-100 MG tablet Take 1 tablet by mouth 3 (three) times daily. 90 tablet 5  . cholecalciferol (VITAMIN D) 1000 UNITS tablet Take 5,000 Units by mouth daily.      . clonazePAM (KLONOPIN) 0.5 MG tablet Take 0.5 tablets (0.25 mg total) by mouth at bedtime. 15 tablet 3  . Denosumab (PROLIA [place]) Inject into the skin every 6 (six) months.    . Elastic Bandages & Supports (ABDOMINAL BINDER/ELASTIC SMALL) MISC 1 Device by Does not apply route daily. 1 each 0  . fluticasone (FLONASE) 50 MCG/ACT nasal spray Place 2 sprays into both nostrils daily. 16 g 1  . Multiple Vitamin (MULTIVITAMIN) capsule 2 tabs po qd     . Omega-3 Fatty Acids (OMEGA 3 PO) 1 tab po qd     . omeprazole (PRILOSEC OTC) 20 MG tablet Take 1 tablet (20 mg total) by mouth daily. 90 tablet 3  . tamoxifen (NOLVADEX) 20 MG tablet TAKE ONE TABLET BY MOUTH ONCE DAILY 30 tablet 2  . venlafaxine XR (EFFEXOR-XR) 75 MG 24 hr capsule TAKE ONE CAPSULE BY MOUTH ONCE DAILY WITH BREAKFAST 30 capsule 5  . vitamin B-12 (CYANOCOBALAMIN) 500 MCG tablet Take 2500 mcg daily.    . Calcium 1500 MG tablet Take 1,500 mg by mouth.      [name]  nitroGLYCERIN (NITROSTAT) 0.3 MG SL tablet Place 1 tablet (0.3 mg total) under the tongue every 5 (five) minutes as needed (max 3 doses). Reported on 04/07/2015 (Patient not taking: Reported on 04/12/2016) 25 tablet 3  . traMADol (ULTRAM) 50 MG tablet TAKE ONE TO TWO TABLETS BY MOUTH AT BEDTIME AS NEEDED FOR PAIN (Patient not taking: Reported on 04/12/2016) 60 tablet 5   No current facility-administered medications for this visit.     SURGICAL HISTORY:  Past Surgical History:  Procedure Laterality Date  . APPENDECTOMY  2002  . BONE BIOPSY  11/06/10  . BREAST LUMPECTOMY  02/2010   right breast lumpectomy and sentinel node biopsy  . CARDIAC CATHETERIZATION  2005   neg  . COLONOSCOPY     negative   . HEMORRHOID  SURGERY  1970's  . SALPINGOOPHORECTOMY  2002   for benign growths  . SHOULDER SURGERY Right   . TOTAL ABDOMINAL HYSTERECTOMY W/ BILATERAL SALPINGOOPHORECTOMY  76 yrs old   for pain,prolapse ; G3 P2  . UPPER GASTROINTESTINAL ENDOSCOPY      REVIEW OF SYSTEMS:  A comprehensive review of systems was negative.   PHYSICAL EXAMINATION: General appearance: alert, cooperative and no distress Head: Normocephalic, without obvious abnormality, atraumatic Neck: no adenopathy, no JVD, supple, symmetrical, trachea midline and thyroid not enlarged, symmetric, no tenderness/mass/nodules Lymph nodes: Cervical, supraclavicular, and axillary nodes normal. Resp: clear to auscultation bilaterally Back: symmetric, no curvature. ROM normal. No CVA tenderness. Cardio: regular rate and rhythm, S1, S2 normal, no murmur, click, rub or gallop GI: soft, non-tender; bowel sounds normal; no masses,  no organomegaly Extremities: extremities normal, atraumatic, no cyanosis or edema   ECOG PERFORMANCE STATUS: 0 - Asymptomatic  Blood pressure 122/66, pulse 81, temperature 98.3 F (36.8 C), temperature source Oral, resp. rate 18, height _0  (1.651 m), weight 130 lb 11.2 oz (59.3 kg), SpO2 100 %.  LABORATORY DATA: Lab Results  Component Value Date   WBC 7.6 04/06/2016   HGB 12.4 04/06/2016   HCT 37.7 04/06/2016   MCV 98.4 04/06/2016   PLT 210 04/06/2016      Chemistry      Component Value Date/Time   NA 144 04/06/2016 1506   K 3.9 04/06/2016 1506   CL 105 10/10/2015 1132   CL 107 03/21/2012 1501   CO2 29 04/06/2016 1506   BUN 14.4 04/06/2016 1506   CREATININE 0.8 04/06/2016 1506      Component Value Date/Time   CALCIUM 9.3 04/06/2016 1506   ALKPHOS 46 04/06/2016 1506   AST 29 04/06/2016 1506   ALT 16 04/06/2016 1506   BILITOT 0.36 04/06/2016 1506       RADIOGRAPHIC STUDIES: No results found.  ASSESSMENT AND PLAN:  This is a very pleasant 76 years old white female with history of breast  adenocarcinoma currently on tamoxifen as well as history of monoclonal gammopathy of undetermined significance currently on observation. The patient is doing fine and tolerating her treatment with tamoxifen well. Her recent myeloma panel showed no clear evidence for disease progression. I discussed the lab result with the patient today. I recommended for her to continue on observation with repeat myeloma panel as well as CA 27.29 in 6 months. She will continue her routine annual screening mammogram as a scheduled. The patient was advised to call immediately if she has any concerning symptoms in the interval. The patient voices understanding of current disease status and treatment options and is in agreement with the current care plan.  All  questions were answered. The patient knows to call the clinic with any problems, questions or concerns. We can certainly see the patient much sooner if necessary. I spent 10 minutes counseling the patient face to face. The total time spent in the appointment was 15 minutes.  Disclaimer: This note was dictated with voice recognition software. Similar sounding words can inadvertently be transcribed and may not be corrected upon review.

## 2016-04-12 NOTE — Telephone Encounter (Signed)
Appointments scheduled per 3.19.18 LOS. Patient given AVS report and calendars with future scheduled appointments.  °

## 2016-04-13 ENCOUNTER — Ambulatory Visit: Payer: Medicare Other | Admitting: Physical Therapy

## 2016-04-14 ENCOUNTER — Ambulatory Visit: Payer: Medicare Other | Admitting: Occupational Therapy

## 2016-04-16 ENCOUNTER — Ambulatory Visit: Payer: Medicare Other | Admitting: Physical Therapy

## 2016-04-19 ENCOUNTER — Ambulatory Visit: Payer: Medicare Other | Admitting: Occupational Therapy

## 2016-04-19 DIAGNOSIS — M25611 Stiffness of right shoulder, not elsewhere classified: Secondary | ICD-10-CM

## 2016-04-19 DIAGNOSIS — R29818 Other symptoms and signs involving the nervous system: Secondary | ICD-10-CM

## 2016-04-19 DIAGNOSIS — R278 Other lack of coordination: Secondary | ICD-10-CM

## 2016-04-19 DIAGNOSIS — R293 Abnormal posture: Secondary | ICD-10-CM

## 2016-04-19 DIAGNOSIS — M25511 Pain in right shoulder: Secondary | ICD-10-CM | POA: Diagnosis not present

## 2016-04-19 DIAGNOSIS — R2681 Unsteadiness on feet: Secondary | ICD-10-CM | POA: Diagnosis not present

## 2016-04-19 DIAGNOSIS — R29898 Other symptoms and signs involving the musculoskeletal system: Secondary | ICD-10-CM | POA: Diagnosis not present

## 2016-04-19 DIAGNOSIS — R2689 Other abnormalities of gait and mobility: Secondary | ICD-10-CM

## 2016-04-19 NOTE — Therapy (Signed)
[facility] [address] [place], [name], [address] Phone: [phone]   Fax:  [phone]  Occupational Therapy Evaluation  Patient Details  Name: [name] MRN: [id number] Date of Birth: 1941-01-11 Referring Provider: Dr. Carles Collet  Encounter Date: 04/19/2016      OT End of Session - 04/19/16 1605    Visit Number 1   Number of Visits 17   Date for OT Re-Evaluation 06/17/16   Authorization Type Medicare   Authorization Time Period 60 days   Authorization - Visit Number 1   Authorization - Number of Visits 10   OT Start Time 1105   OT Stop Time 1145   OT Time Calculation (min) 40 min   Activity Tolerance Patient tolerated treatment well   Behavior During Therapy [facility] for tasks assessed/performed      Past Medical History:  Diagnosis Date  . Allergy    SEASONAL  . Anemia    Anemia-NOS / PMH, Dr Julien Nordmann  . Arthritis   . Breast cancer ([place])    stage I right breast  . Cataract    BILATERAL  . Decreased vision    R eye  . Esophageal Stricture   . Hyperlipidemia   . Monoclonal gammopathy   . MVP (mitral valve prolapse)   . Osteoporosis   . Parkinson disease ([place])   . Skin cancer    basal cell L neck  . Vertical diplopia     Past Surgical History:  Procedure Laterality Date  . APPENDECTOMY  2002  . BONE BIOPSY  11/06/10  . BREAST LUMPECTOMY  02/2010   right breast lumpectomy and sentinel node biopsy  . CARDIAC CATHETERIZATION  2005   neg  . COLONOSCOPY     negative   . HEMORRHOID SURGERY  1970's  . SALPINGOOPHORECTOMY  2002   for benign growths  . SHOULDER SURGERY Right   . TOTAL ABDOMINAL HYSTERECTOMY W/ BILATERAL SALPINGOOPHORECTOMY  76 yrs old   for pain,prolapse ; G3 P2  . UPPER GASTROINTESTINAL ENDOSCOPY      There were no vitals filed for this visit.      Subjective Assessment - 04/19/16 1547    Subjective  Pt with PD returns to therapy s/p rotator cuff surgery in July 2017, pt   now has increased left shoulder pain   Patient Stated Goals decrease shoulder pain and improve right arm use   Currently in Pain? Yes   Pain Score 5    Pain Location Arm   Pain Orientation Right   Pain Descriptors / Indicators Aching   Pain Type Acute pain   Pain Onset More than a month ago   Pain Frequency Intermittent   Aggravating Factors  lifting wood   Pain Relieving Factors rest   Multiple Pain Sites No           [facility] OT Assessment - 04/19/16 1113      Assessment   Diagnosis Parkinson's disease, right rotator cuff surgery (July 2017)   Referring Provider Dr. Carles Collet   Prior Therapy OT,PT     Precautions   Precautions Fall     Balance Screen   Has the patient fallen in the past 6 months Yes   How many times? 1   Has the patient had a decrease in activity level because of a fear of falling?  No   Is the patient reluctant to leave their home because of a fear of falling?  No     Home  Environment   Family/patient expects to be discharged to: Private residence   Lives With [place] with basic ADLs;Independent with household mobility without device;Independent with community mobility without device   Leisure Walks everyday at gym (1 hr/day)     ADL   Eating/Feeding Modified independent   Grooming Modified independent   Upper Body Bathing Modified independent   Lower Body Bathing Modified independent   Upper Body Dressing Independent  difficulty removing bra   Lower Body Dressing Independent   Toileting - Clothing Manipulation Independent   Tub/Shower Transfer Independent  tub / shower     IADL   Shopping Takes care of all shopping needs independently   Meal Prep Plans, prepares and serves adequate meals independently   Medication Management Is responsible for taking medication in correct dosages at correct time   Financial Management Manages financial matters independently (budgets, writes checks, pays rent,  bills goes to bank), collects and keeps track of income     Written Expression   Dominant Hand Right   Handwriting 100% legible     Vision Assessment   Vision Assessment Vision not tested     Cognition   Overall Cognitive Status Within Functional Limits for tasks assessed     Observation/Other Assessments   Focus on Therapeutic Outcomes (FOTO)  NA   Standing Functional Reach Test RUE 10 inches LUE 9 inches   Other Surveys  Select   Physical Performance Test   Yes   Donning Doffing Jacket Time (seconds) 19.53 secs    Donning Doffing Jacket Comments 3 button/ unbutton 29.06 sec     Sensation   Light Touch Appears Intact     Coordination   9 Hole Peg Test Right;Left   Right 9 Hole Peg Test 22.72 secs   Left 9 Hole Peg Test 26.07 secs   Box and Blocks RUE 61 blocks,   LUE  53 blocks   Tremors bilateral, left more pronounced than right      ROM / Strength   AROM / PROM / Strength AROM     AROM   Overall AROM  Deficits;Due to pain   Right/Left Shoulder Right   Right Shoulder Flexion 100 Degrees   Right Shoulder ABduction 78 Degrees   Left Shoulder Flexion 135 Degrees   Left Shoulder ABduction 115 Degrees   Right/Left Elbow Right;Left   Right Elbow Extension -10   Left Elbow Extension --  WFLS                           OT Short Term Goals - 04/19/16 1557      OT SHORT TERM GOAL #1   Title Pt will be independent with initial HEP--check STGs 05/19/16   Time 4   Period Weeks   Status New     OT SHORT TERM GOAL #2   Title Pt will demo at least 110* R shoulder flex with 3/10 pain or less.   Baseline 100, pain 5/10   Time 4   Period Weeks   Status New     OT SHORT TERM GOAL #3   Title Pt will verbalize understanding of adapted strategies for ADLs/ IADLS to minimize pain and maximize independence.   Time 4   Period Weeks   Status New     OT SHORT TERM GOAL #4   Title Pt will demo at least 85* R shoulder abduction  for ADLs.   Baseline 78    Time 4   Period Weeks   Status New           OT Long Term Goals - 04/19/16 1604      OT LONG TERM GOAL #1   Title Pt will demonstrate improved ability to fasten buttons as evidenced by decreasing 3 button/ unbutton to 25 secs or less.due 06/17/16   Baseline 29.06 secs   Time 8   Period Weeks   Status New     OT LONG TERM GOAL #2   Title Pt will demonstrate improved ability to donn/ doff a jacket as evidenced by decreasing PPT#4 to 16 secs or less   Baseline 19.53 secs   Status New     OT LONG TERM GOAL #3   Title Pt will improve balance for ADLs/IADLs as shown by improving standing functional reach test to 10 inches for LUE   Baseline RUE 10 inches, LUE 9 inches   Time 8   Period Weeks   Status New     OT LONG TERM GOAL #4   Title Pt will report RUE pain 3/10 or less for performance of ADLS.   Time 8   Period Weeks   Status New     OT LONG TERM GOAL #5   Title ---------------------------------------------------               Plan - 04/19/16 1555    Clinical Impression Statement Pt is a 76 y.o. female with Parkinson's disease and R shoulder pain, s/p rotator cuff repair in July 2017 by [name].  Pt with PMH that includes hx of R breast CA with lymph node removal and radiation, multiple myeloma, and osteoporosis.  Pt presents with R shoulder pain limiting RUE functional use, decr ROM, bradykinesia, abnomal posture, mild rigidity, mild decr coordination.  Pt would benefit from occupational therapy to address these deficits in order to improve RUE functional use, ADL/IADLs performance/ease, and to maintain quality of life.   Rehab Potential Good   OT Frequency 2x / week  plus eval   OT Treatment/Interventions Self-care/ADL training;Moist Heat;Fluidtherapy;DME and/or AE instruction;Patient/family education;Balance training;Therapeutic exercises;Contrast Bath;Ultrasound;Therapeutic exercise;Therapeutic activities;Cognitive remediation/compensation;Passive range of  motion;Functional Mobility Training;Neuromuscular education;Cryotherapy;Energy conservation;Manual Therapy;Dry needling;Visual/perceptual remediation/compensation   Plan supine PWR! activity modification to reduce shoulder pain   Consulted and Agree with Plan of Care Patient      Patient will benefit from skilled therapeutic intervention in order to improve the following deficits and impairments:  Abnormal gait, Decreased cognition, Decreased knowledge of use of DME, Impaired flexibility, Pain, Decreased coordination, Impaired tone, Decreased strength, Decreased range of motion, Decreased endurance, Decreased activity tolerance, Decreased balance, Decreased knowledge of precautions, Decreased safety awareness, Difficulty walking, Impaired UE functional use  Visit Diagnosis: Other symptoms and signs involving the nervous system - Plan: Ot plan of care cert/re-cert  Other symptoms and signs involving the musculoskeletal system - Plan: Ot plan of care cert/re-cert  Acute pain of right shoulder - Plan: Ot plan of care cert/re-cert  Stiffness of right shoulder, not elsewhere classified - Plan: Ot plan of care cert/re-cert  Abnormal posture - Plan: Ot plan of care cert/re-cert  Other abnormalities of gait and mobility - Plan: Ot plan of care cert/re-cert  Unsteadiness on feet - Plan: Ot plan of care cert/re-cert  Other lack of coordination - Plan: Ot plan of care cert/re-cert      G-Codes - 37/48/27 1604    Functional Assessment Tool Used (Outpatient only)  3 button/ unbutton 29.06 secs, PPT#4: 19.53 secs   Functional Limitation Self care   Self Care Current Status (J6734) At least 20 percent but less than 40 percent impaired, limited or restricted   Self Care Goal Status (L9379) At least 1 percent but less than 20 percent impaired, limited or restricted      Problem List Patient Active Problem List   Diagnosis Date Noted  . Acute non-recurrent sinusitis 12/30/2015  . Headache  05/11/2015  . Globus sensation 04/24/2015  . Foot pain 02/25/2015  . Shoulder pain 04/17/2014  . PD (Parkinson's disease) ([place]) 06/05/2013  . Medicare annual wellness visit, subsequent 05/02/2012  . Osteoporosis 03/08/2011  . Breast cancer ([place]) 11/19/2010  . GERD (gastroesophageal reflux disease) 08/01/2008  . DYSPHAGIA UNSPECIFIED 08/01/2008  . ORTHOSTATIC HYPOTENSION 06/28/2008  . HLD (hyperlipidemia) 07/10/2007  . MONOCLONAL GAMMOPATHY 07/10/2007  . ANEMIA-NOS 07/10/2007  . FATIGUE 07/10/2007    Yaiden Yang 04/19/2016, 4:20 PM Theone Murdoch, OTR/L [phone] Phone: [phone] 4:20 PM 04/19/16 [facility] [address] [place], [name], [address] Phone: [phone]   Fax:  [phone]  Name: [name] MRN: [id number] Date of Birth: July 23, 1940

## 2016-04-23 ENCOUNTER — Other Ambulatory Visit: Payer: Self-pay | Admitting: Neurology

## 2016-04-26 ENCOUNTER — Ambulatory Visit: Payer: Medicare Other | Attending: Neurology | Admitting: Occupational Therapy

## 2016-04-26 DIAGNOSIS — G8929 Other chronic pain: Secondary | ICD-10-CM | POA: Diagnosis not present

## 2016-04-26 DIAGNOSIS — M25511 Pain in right shoulder: Secondary | ICD-10-CM | POA: Insufficient documentation

## 2016-04-26 DIAGNOSIS — R2681 Unsteadiness on feet: Secondary | ICD-10-CM | POA: Diagnosis not present

## 2016-04-26 DIAGNOSIS — R29898 Other symptoms and signs involving the musculoskeletal system: Secondary | ICD-10-CM | POA: Insufficient documentation

## 2016-04-26 DIAGNOSIS — R293 Abnormal posture: Secondary | ICD-10-CM | POA: Diagnosis not present

## 2016-04-26 DIAGNOSIS — M25611 Stiffness of right shoulder, not elsewhere classified: Secondary | ICD-10-CM | POA: Diagnosis not present

## 2016-04-26 DIAGNOSIS — R278 Other lack of coordination: Secondary | ICD-10-CM

## 2016-04-26 DIAGNOSIS — R29818 Other symptoms and signs involving the nervous system: Secondary | ICD-10-CM | POA: Insufficient documentation

## 2016-04-26 DIAGNOSIS — R2689 Other abnormalities of gait and mobility: Secondary | ICD-10-CM | POA: Insufficient documentation

## 2016-04-26 NOTE — Therapy (Signed)
[facility] [address] [place], [name], [address] Phone: [phone]   Fax:  [phone]  Occupational Therapy Treatment  Patient Details  Name: [name] MRN: [id number] Date of Birth: 1940/03/18 Referring Provider: Dr. Carles Collet  Encounter Date: 04/26/2016      OT End of Session - 04/26/16 0856    Visit Number 2   Number of Visits 17   Date for OT Re-Evaluation 06/17/16   Authorization Type Medicare   Authorization Time Period 60 days   Authorization - Visit Number 2   Authorization - Number of Visits 10   OT Start Time [phone]   OT Stop Time 0930   OT Time Calculation (min) 38 min   Activity Tolerance Patient tolerated treatment well   Behavior During Therapy [facility] for tasks assessed/performed      Past Medical History:  Diagnosis Date  . Allergy    SEASONAL  . Anemia    Anemia-NOS / PMH, Dr Julien Nordmann  . Arthritis   . Breast cancer ([place])    stage I right breast  . Cataract    BILATERAL  . Decreased vision    R eye  . Esophageal Stricture   . Hyperlipidemia   . Monoclonal gammopathy   . MVP (mitral valve prolapse)   . Osteoporosis   . Parkinson disease ([place])   . Skin cancer    basal cell L neck  . Vertical diplopia     Past Surgical History:  Procedure Laterality Date  . APPENDECTOMY  2002  . BONE BIOPSY  11/06/10  . BREAST LUMPECTOMY  02/2010   right breast lumpectomy and sentinel node biopsy  . CARDIAC CATHETERIZATION  2005   neg  . COLONOSCOPY     negative   . HEMORRHOID SURGERY  1970's  . SALPINGOOPHORECTOMY  2002   for benign growths  . SHOULDER SURGERY Right   . TOTAL ABDOMINAL HYSTERECTOMY W/ BILATERAL SALPINGOOPHORECTOMY  76 yrs old   for pain,prolapse ; G3 P2  . UPPER GASTROINTESTINAL ENDOSCOPY      There were no vitals filed for this visit.      Subjective Assessment - 04/26/16 0854    Subjective  Pt with PD returns to therapy s/p rotator cuff surgery in July 2017, pt   now has increased left shoulder pain   Pertinent History hx of R breast CA with lumpectomy and lymph node removal and radiation 2012; multiple myeloma, osteoporesis   Patient Stated Goals decrease shoulder pain and improve right arm use   Currently in Pain? No/denies   Pain Onset More than a month ago      Neuro re-ed:  PWR! Moves (up, rock, twist) in supine x 20 each with min occasional cues For incr movement amplitude.  Arm bike x6 min level 1 for reciprocal movement with cues/target of at least 40rpms for intensity while maintaining movement amplitude/reciprocal movement.   Pt maintained  38-43rpms.  In sitting functional reaching with BUEs in sitting with trunk rotation/lateral and forward wt shifts with min cueing and set-up for large amplitude movements.  Sliding cards off table by using PWR! Hands with focus on finger ext and min cues for incr movement amplitude.  Donning/doffing jacket with good strategy and technique today.                              OT Short Term Goals - 04/19/16 1557  OT SHORT TERM GOAL #1   Title Pt will be independent with initial HEP--check STGs 05/19/16   Time 4   Period Weeks   Status New     OT SHORT TERM GOAL #2   Title Pt will demo at least 110* R shoulder flex with 3/10 pain or less.   Baseline 100, pain 5/10   Time 4   Period Weeks   Status New     OT SHORT TERM GOAL #3   Title Pt will verbalize understanding of adapted strategies for ADLs/ IADLS to minimize pain and maximize independence.   Time 4   Period Weeks   Status New     OT SHORT TERM GOAL #4   Title Pt will demo at least 85* R shoulder abduction for ADLs.   Baseline 78   Time 4   Period Weeks   Status New           OT Long Term Goals - 04/19/16 1604      OT LONG TERM GOAL #1   Title Pt will demonstrate improved ability to fasten buttons as evidenced by decreasing 3 button/ unbutton to 25 secs or less.due 06/17/16   Baseline 29.06 secs    Time 8   Period Weeks   Status New     OT LONG TERM GOAL #2   Title Pt will demonstrate improved ability to donn/ doff a jacket as evidenced by decreasing PPT#4 to 16 secs or less   Baseline 19.53 secs   Status New     OT LONG TERM GOAL #3   Title Pt will improve balance for ADLs/IADLs as shown by improving standing functional reach test to 10 inches for LUE   Baseline RUE 10 inches, LUE 9 inches   Time 8   Period Weeks   Status New     OT LONG TERM GOAL #4   Title Pt will report RUE pain 3/10 or less for performance of ADLS.   Time 8   Period Weeks   Status New     OT LONG TERM GOAL #5   Title ---------------------------------------------------               Plan - 04/26/16 9924    Rehab Potential Good   OT Frequency 2x / week  plus eval   OT Treatment/Interventions Self-care/ADL training;Moist Heat;Fluidtherapy;DME and/or AE instruction;Patient/family education;Balance training;Therapeutic exercises;Contrast Bath;Ultrasound;Therapeutic exercise;Therapeutic activities;Cognitive remediation/compensation;Passive range of motion;Functional Mobility Training;Neuromuscular education;Cryotherapy;Energy conservation;Manual Therapy;Dry needling;Visual/perceptual remediation/compensation   Plan supine PWR!, functional reaching as able   Consulted and Agree with Plan of Care Patient      Patient will benefit from skilled therapeutic intervention in order to improve the following deficits and impairments:  Abnormal gait, Decreased cognition, Decreased knowledge of use of DME, Impaired flexibility, Pain, Decreased coordination, Impaired tone, Decreased strength, Decreased range of motion, Decreased endurance, Decreased activity tolerance, Decreased balance, Decreased knowledge of precautions, Decreased safety awareness, Difficulty walking, Impaired UE functional use  Visit Diagnosis: Other symptoms and signs involving the nervous system  Other symptoms and signs involving the  musculoskeletal system  Acute pain of right shoulder  Stiffness of right shoulder, not elsewhere classified  Abnormal posture  Other abnormalities of gait and mobility  Other lack of coordination  Unsteadiness on feet  Chronic right shoulder pain    Problem List Patient Active Problem List   Diagnosis Date Noted  . Acute non-recurrent sinusitis 12/30/2015  . Headache 05/11/2015  . Globus sensation 04/24/2015  . Foot pain  02/25/2015  . Shoulder pain 04/17/2014  . PD (Parkinson's disease) ([place]) 06/05/2013  . Medicare annual wellness visit, subsequent 05/02/2012  . Osteoporosis 03/08/2011  . Breast cancer ([place]) 11/19/2010  . GERD (gastroesophageal reflux disease) 08/01/2008  . DYSPHAGIA UNSPECIFIED 08/01/2008  . ORTHOSTATIC HYPOTENSION 06/28/2008  . HLD (hyperlipidemia) 07/10/2007  . MONOCLONAL GAMMOPATHY 07/10/2007  . ANEMIA-NOS 07/10/2007  . FATIGUE 07/10/2007    [facility] 04/26/2016, 9:10 AM  [facility] [address] [place], [name], [address] Phone: [phone]   Fax:  [phone]  Name: [name] MRN: [id number] Date of Birth: 15-May-1940   Vianne Bulls, OTR/L [facility] [address] [place], [place]  [phone] phone [phone] 04/26/16 9:11 AM

## 2016-04-29 ENCOUNTER — Ambulatory Visit: Payer: Medicare Other | Admitting: Occupational Therapy

## 2016-04-29 DIAGNOSIS — R293 Abnormal posture: Secondary | ICD-10-CM

## 2016-04-29 DIAGNOSIS — R29818 Other symptoms and signs involving the nervous system: Secondary | ICD-10-CM | POA: Diagnosis not present

## 2016-04-29 DIAGNOSIS — R29898 Other symptoms and signs involving the musculoskeletal system: Secondary | ICD-10-CM | POA: Diagnosis not present

## 2016-04-29 DIAGNOSIS — R278 Other lack of coordination: Secondary | ICD-10-CM

## 2016-04-29 DIAGNOSIS — M25511 Pain in right shoulder: Secondary | ICD-10-CM | POA: Diagnosis not present

## 2016-04-29 DIAGNOSIS — M25611 Stiffness of right shoulder, not elsewhere classified: Secondary | ICD-10-CM | POA: Diagnosis not present

## 2016-04-29 DIAGNOSIS — R2689 Other abnormalities of gait and mobility: Secondary | ICD-10-CM

## 2016-04-29 NOTE — Therapy (Signed)
[facility] [address] [place], [name], [address] Phone: [phone]   Fax:  [phone]  Occupational Therapy Treatment  Patient Details  Name: [name] MRN: [id number] Date of Birth: [birth year] Referring Provider: Dr. Carles Collet  Encounter Date: 04/29/2016      OT End of Session - 04/29/16 0814    Visit Number 3   Number of Visits 17   Date for OT Re-Evaluation 06/17/16   Authorization Type Medicare   Authorization Time Period 60 days   Authorization - Visit Number 3   Authorization - Number of Visits 10   OT Start Time 0804   OT Stop Time 0845   OT Time Calculation (min) 41 min      Past Medical History:  Diagnosis Date  . Allergy    SEASONAL  . Anemia    Anemia-NOS / PMH, Dr Julien Nordmann  . Arthritis   . Breast cancer ([place])    stage I right breast  . Cataract    BILATERAL  . Decreased vision    R eye  . Esophageal Stricture   . Hyperlipidemia   . Monoclonal gammopathy   . MVP (mitral valve prolapse)   . Osteoporosis   . Parkinson disease ([place])   . Skin cancer    basal cell L neck  . Vertical diplopia     Past Surgical History:  Procedure Laterality Date  . APPENDECTOMY  2002  . BONE BIOPSY  11/06/10  . BREAST LUMPECTOMY  02/2010   right breast lumpectomy and sentinel node biopsy  . CARDIAC CATHETERIZATION  2005   neg  . COLONOSCOPY     negative   . HEMORRHOID SURGERY  1970's  . SALPINGOOPHORECTOMY  2002   for benign growths  . SHOULDER SURGERY Right   . TOTAL ABDOMINAL HYSTERECTOMY W/ BILATERAL SALPINGOOPHORECTOMY  76 yrs old   for pain,prolapse ; G3 P2  . UPPER GASTROINTESTINAL ENDOSCOPY      There were no vitals filed for this visit.      Subjective Assessment - 04/29/16 0805    Subjective  Pt reports pain in right shoulder 5/10 when she woke up   Pertinent History hx of R breast CA with lumpectomy and lymph node removal and radiation 2012; multiple myeloma, osteoporesis   Patient Stated Goals decrease shoulder pain and improve right arm use   Currently in Pain? Yes   Pain Score 1   pain decreased now   Pain Location Arm   Pain Orientation Right   Pain Descriptors / Indicators Aching   Pain Type Chronic pain   Pain Onset More than a month ago   Pain Frequency Intermittent   Aggravating Factors  malpositioning   Pain Relieving Factors repositioning   Effect of Pain on Daily Activities limits functional use            Treatment: arm bike x 6 mins level 1 for conditioning, 38-42 rpm  Dynamic mid high level reaching to place graded clothespins on antennae for left and right UE's, min v.c. For larger amplitude movements. Rotating, tossing ball with bilateral UE's min v.c. For larger amplitude movements. Placing grooved pegs in pegboard with right and left UE's than removing with PWR! Hands, min v.c.                   OT Education - 04/29/16 0847    Education provided Yes   Education Details PWR! up, rock and twist in supine 20 reps each,  min v.c.   Person(s) Educated Patient   Methods Explanation;Demonstration;Verbal cues;Handout   Comprehension Verbalized understanding;Returned demonstration;Verbal cues required          OT Short Term Goals - 04/19/16 1557      OT SHORT TERM GOAL #1   Title Pt will be independent with initial HEP--check STGs 05/19/16   Time 4   Period Weeks   Status New     OT SHORT TERM GOAL #2   Title Pt will demo at least 110* R shoulder flex with 3/10 pain or less.   Baseline 100, pain 5/10   Time 4   Period Weeks   Status New     OT SHORT TERM GOAL #3   Title Pt will verbalize understanding of adapted strategies for ADLs/ IADLS to minimize pain and maximize independence.   Time 4   Period Weeks   Status New     OT SHORT TERM GOAL #4   Title Pt will demo at least 85* R shoulder abduction for ADLs.   Baseline 78   Time 4   Period Weeks   Status New           OT Long Term Goals -  04/19/16 1604      OT LONG TERM GOAL #1   Title Pt will demonstrate improved ability to fasten buttons as evidenced by decreasing 3 button/ unbutton to 25 secs or less.due 06/17/16   Baseline 29.06 secs   Time 8   Period Weeks   Status New     OT LONG TERM GOAL #2   Title Pt will demonstrate improved ability to donn/ doff a jacket as evidenced by decreasing PPT#4 to 16 secs or less   Baseline 19.53 secs   Status New     OT LONG TERM GOAL #3   Title Pt will improve balance for ADLs/IADLs as shown by improving standing functional reach test to 10 inches for LUE   Baseline RUE 10 inches, LUE 9 inches   Time 8   Period Weeks   Status New     OT LONG TERM GOAL #4   Title Pt will report RUE pain 3/10 or less for performance of ADLS.   Time 8   Period Weeks   Status New     OT LONG TERM GOAL #5   Title ---------------------------------------------------               Plan - 04/29/16 0840    Clinical Impression Statement Pt is progressing towards goals. she demonstrates good performance of PWR! exercises in supine.   Rehab Potential Good   OT Frequency 2x / week   OT Duration 8 weeks   OT Treatment/Interventions Self-care/ADL training;Moist Heat;Fluidtherapy;DME and/or AE instruction;Patient/family education;Balance training;Therapeutic exercises;Contrast Bath;Ultrasound;Therapeutic exercise;Therapeutic activities;Cognitive remediation/compensation;Passive range of motion;Functional Mobility Training;Neuromuscular education;Cryotherapy;Energy conservation;Manual Therapy;Dry needling;Visual/perceptual remediation/compensation   Plan coodination HEP, functional reaching   Consulted and Agree with Plan of Care Patient      Patient will benefit from skilled therapeutic intervention in order to improve the following deficits and impairments:  Abnormal gait, Decreased cognition, Decreased knowledge of use of DME, Impaired flexibility, Pain, Decreased coordination, Impaired tone,  Decreased strength, Decreased range of motion, Decreased endurance, Decreased activity tolerance, Decreased balance, Decreased knowledge of precautions, Decreased safety awareness, Difficulty walking, Impaired UE functional use  Visit Diagnosis: Other symptoms and signs involving the nervous system  Other symptoms and signs involving the musculoskeletal system  Acute pain of right shoulder  Stiffness of right  shoulder, not elsewhere classified  Abnormal posture  Other abnormalities of gait and mobility  Other lack of coordination    Problem List Patient Active Problem List   Diagnosis Date Noted  . Acute non-recurrent sinusitis 12/30/2015  . Headache 05/11/2015  . Globus sensation 04/24/2015  . Foot pain 02/25/2015  . Shoulder pain 04/17/2014  . PD (Parkinson's disease) ([place]) 06/05/2013  . Medicare annual wellness visit, subsequent 05/02/2012  . Osteoporosis 03/08/2011  . Breast cancer ([place]) 11/19/2010  . GERD (gastroesophageal reflux disease) 08/01/2008  . DYSPHAGIA UNSPECIFIED 08/01/2008  . ORTHOSTATIC HYPOTENSION 06/28/2008  . HLD (hyperlipidemia) 07/10/2007  . MONOCLONAL GAMMOPATHY 07/10/2007  . ANEMIA-NOS 07/10/2007  . FATIGUE 07/10/2007    , 04/29/2016, 8:58 AM  [facility] [address] [place], [name], [address] Phone: [phone]   Fax:  [phone]  Name: [name] MRN: [id number] Date of Birth: [birth year]

## 2016-05-04 ENCOUNTER — Ambulatory Visit: Payer: Medicare Other | Admitting: Occupational Therapy

## 2016-05-04 DIAGNOSIS — R293 Abnormal posture: Secondary | ICD-10-CM

## 2016-05-04 DIAGNOSIS — R2689 Other abnormalities of gait and mobility: Secondary | ICD-10-CM

## 2016-05-04 DIAGNOSIS — R29898 Other symptoms and signs involving the musculoskeletal system: Secondary | ICD-10-CM | POA: Diagnosis not present

## 2016-05-04 DIAGNOSIS — M25511 Pain in right shoulder: Secondary | ICD-10-CM | POA: Diagnosis not present

## 2016-05-04 DIAGNOSIS — M25611 Stiffness of right shoulder, not elsewhere classified: Secondary | ICD-10-CM

## 2016-05-04 DIAGNOSIS — R29818 Other symptoms and signs involving the nervous system: Secondary | ICD-10-CM

## 2016-05-04 DIAGNOSIS — R278 Other lack of coordination: Secondary | ICD-10-CM

## 2016-05-04 NOTE — Patient Instructions (Signed)
Coordination Exercises  Perform the following exercises for 20 minutes 1 times per day. Perform with both hand(s). Perform using big movements.  Flipping Cards: Place deck of cards on the table. Flip cards over by opening your hand big to grasp and then turn your palm up big. Deal cards: Hold 1/2 or whole deck in your hand. Use thumb to push card off top of deck with one big push. Rotate ball with fingertips: Pick up with fingers/thumb and move as much as you can with each turn/movement (clockwise and counter-clockwise). Toss ball from one hand to the other: Toss big/high. Toss ball in the air and catch with the same hand: Toss big/high. Pick up coins and place in coin bank or container: Pick up with big, intentional movements. Do not drag coin to the edge. Pick up coins and stack one at a time: Pick up with big, intentional movements. Do not drag coin to the edge. (5-10 in a stack) Pick up 5-10 coins one at a time and hold in palm. Then, move coins from palm to fingertips one at time and place in coin bank/container. Practice writing: Slow down, write big, and focus on forming each letter. Perform "Flicks"/hand stretches (PWR! Hands): Close hands then flick out your fingers with focus on opening hands, pulling wrists back, and extending elbows like you are pushing. 

## 2016-05-04 NOTE — Therapy (Signed)
[facility] [address] [place], [name], [address] Phone: [phone]   Fax:  [phone]  Occupational Therapy Treatment  Patient Details  Name: [name] MRN: [id number] Date of Birth: [birth year] Referring Provider: Dr. Carles Collet  Encounter Date: 05/04/2016      OT End of Session - 05/04/16 1359    Visit Number 4   Number of Visits 17   Date for OT Re-Evaluation 06/17/16   Authorization Type Medicare   Authorization Time Period 60 days   Authorization - Visit Number 4   Authorization - Number of Visits 10   OT Start Time 1318   OT Stop Time 1400   OT Time Calculation (min) 42 min   Activity Tolerance Patient tolerated treatment well   Behavior During Therapy [facility] for tasks assessed/performed      Past Medical History:  Diagnosis Date  . Allergy    SEASONAL  . Anemia    Anemia-NOS / PMH, Dr Julien Nordmann  . Arthritis   . Breast cancer ([place])    stage I right breast  . Cataract    BILATERAL  . Decreased vision    R eye  . Esophageal Stricture   . Hyperlipidemia   . Monoclonal gammopathy   . MVP (mitral valve prolapse)   . Osteoporosis   . Parkinson disease ([place])   . Skin cancer    basal cell L neck  . Vertical diplopia     Past Surgical History:  Procedure Laterality Date  . APPENDECTOMY  2002  . BONE BIOPSY  11/06/10  . BREAST LUMPECTOMY  02/2010   right breast lumpectomy and sentinel node biopsy  . CARDIAC CATHETERIZATION  2005   neg  . COLONOSCOPY     negative   . HEMORRHOID SURGERY  1970's  . SALPINGOOPHORECTOMY  2002   for benign growths  . SHOULDER SURGERY Right   . TOTAL ABDOMINAL HYSTERECTOMY W/ BILATERAL SALPINGOOPHORECTOMY  76 yrs old   for pain,prolapse ; G3 P2  . UPPER GASTROINTESTINAL ENDOSCOPY      There were no vitals filed for this visit.      Subjective Assessment - 05/04/16 1708    Subjective  Deneis pain   Pertinent History hx of R breast CA with lumpectomy and  lymph node removal and radiation 2012; multiple myeloma, osteoporesis   Patient Stated Goals decrease shoulder pain and improve right arm use   Currently in Pain? No/denies           Treatment: Dynamic step and reach, mod v.c. For foot placement, big steps and to avoid crossing feet Reviewed PWR! Basic 4 in standing, min v.c. For larger amplitude movements. Discussed strategies for donning/ offing her bra to minimize shoulder pain. Arm bike x 5 mins level 1, pt maintained 40 rpm                   OT Education - 05/04/16 1704    Education provided Yes   Education Details coordination HEP   Person(s) Educated Patient   Methods Explanation;Demonstration;Verbal cues;Handout   Comprehension Verbalized understanding;Returned demonstration;Verbal cues required          OT Short Term Goals - 04/19/16 1557      OT SHORT TERM GOAL #1   Title Pt will be independent with initial HEP--check STGs 05/19/16   Time 4   Period Weeks   Status New     OT SHORT TERM GOAL #2   Title Pt will demo  at least 110* R shoulder flex with 3/10 pain or less.   Baseline 100, pain 5/10   Time 4   Period Weeks   Status New     OT SHORT TERM GOAL #3   Title Pt will verbalize understanding of adapted strategies for ADLs/ IADLS to minimize pain and maximize independence.   Time 4   Period Weeks   Status New     OT SHORT TERM GOAL #4   Title Pt will demo at least 85* R shoulder abduction for ADLs.   Baseline 78   Time 4   Period Weeks   Status New           OT Long Term Goals - 04/19/16 1604      OT LONG TERM GOAL #1   Title Pt will demonstrate improved ability to fasten buttons as evidenced by decreasing 3 button/ unbutton to 25 secs or less.due 06/17/16   Baseline 29.06 secs   Time 8   Period Weeks   Status New     OT LONG TERM GOAL #2   Title Pt will demonstrate improved ability to donn/ doff a jacket as evidenced by decreasing PPT#4 to 16 secs or less   Baseline 19.53  secs   Status New     OT LONG TERM GOAL #3   Title Pt will improve balance for ADLs/IADLs as shown by improving standing functional reach test to 10 inches for LUE   Baseline RUE 10 inches, LUE 9 inches   Time 8   Period Weeks   Status New     OT LONG TERM GOAL #4   Title Pt will report RUE pain 3/10 or less for performance of ADLS.   Time 8   Period Weeks   Status New     OT LONG TERM GOAL #5   Title ---------------------------------------------------               Plan - 05/04/16 1344    Clinical Impression Statement Pt is progressing towards goals. She reports decreased overall pain in right shoulder.   Rehab Potential Good   OT Frequency 2x / week   OT Duration 8 weeks   OT Treatment/Interventions Self-care/ADL training;Moist Heat;Fluidtherapy;DME and/or AE instruction;Patient/family education;Balance training;Therapeutic exercises;Contrast Bath;Ultrasound;Therapeutic exercise;Therapeutic activities;Cognitive remediation/compensation;Passive range of motion;Functional Mobility Training;Neuromuscular education;Cryotherapy;Energy conservation;Manual Therapy;Dry needling;Visual/perceptual remediation/compensation   Plan functional reaching   Consulted and Agree with Plan of Care Patient      Patient will benefit from skilled therapeutic intervention in order to improve the following deficits and impairments:  Abnormal gait, Decreased cognition, Decreased knowledge of use of DME, Impaired flexibility, Pain, Decreased coordination, Impaired tone, Decreased strength, Decreased range of motion, Decreased endurance, Decreased activity tolerance, Decreased balance, Decreased knowledge of precautions, Decreased safety awareness, Difficulty walking, Impaired UE functional use  Visit Diagnosis: Other symptoms and signs involving the nervous system  Other symptoms and signs involving the musculoskeletal system  Acute pain of right shoulder  Stiffness of right shoulder, not  elsewhere classified  Abnormal posture  Other abnormalities of gait and mobility  Other lack of coordination    Problem List Patient Active Problem List   Diagnosis Date Noted  . Acute non-recurrent sinusitis 12/30/2015  . Headache 05/11/2015  . Globus sensation 04/24/2015  . Foot pain 02/25/2015  . Shoulder pain 04/17/2014  . PD (Parkinson's disease) ([place]) 06/05/2013  . Medicare annual wellness visit, subsequent 05/02/2012  . Osteoporosis 03/08/2011  . Breast cancer ([place]) 11/19/2010  . GERD (gastroesophageal reflux  disease) 08/01/2008  . DYSPHAGIA UNSPECIFIED 08/01/2008  . ORTHOSTATIC HYPOTENSION 06/28/2008  . HLD (hyperlipidemia) 07/10/2007  . MONOCLONAL GAMMOPATHY 07/10/2007  . ANEMIA-NOS 07/10/2007  . FATIGUE 07/10/2007    Micajah Dennin 05/04/2016, 5:08 PM  [facility] [address] [place], [name], [address] Phone: [phone]   Fax:  [phone]  Name: KAISEY HUSEBY MRN: [id number] Date of Birth: 09-03-1940

## 2016-05-05 ENCOUNTER — Ambulatory Visit: Payer: Medicare Other | Admitting: Occupational Therapy

## 2016-05-05 DIAGNOSIS — R29898 Other symptoms and signs involving the musculoskeletal system: Secondary | ICD-10-CM

## 2016-05-05 DIAGNOSIS — R29818 Other symptoms and signs involving the nervous system: Secondary | ICD-10-CM

## 2016-05-05 DIAGNOSIS — M25511 Pain in right shoulder: Secondary | ICD-10-CM

## 2016-05-05 DIAGNOSIS — M25611 Stiffness of right shoulder, not elsewhere classified: Secondary | ICD-10-CM | POA: Diagnosis not present

## 2016-05-05 DIAGNOSIS — R2689 Other abnormalities of gait and mobility: Secondary | ICD-10-CM | POA: Diagnosis not present

## 2016-05-05 DIAGNOSIS — R293 Abnormal posture: Secondary | ICD-10-CM | POA: Diagnosis not present

## 2016-05-05 NOTE — Therapy (Signed)
[facility] [address] [place], [name], [address] Phone: [phone]   Fax:  [phone]  Occupational Therapy Treatment  Patient Details  Name: [name] MRN: [id number] Date of Birth: 08/15/1940 Referring Provider: Dr. Carles Collet  Encounter Date: 05/05/2016      OT End of Session - 05/05/16 1654    Visit Number 5   Number of Visits 17   Date for OT Re-Evaluation 06/17/16   Authorization Type Medicare   Authorization Time Period 60 days   Authorization - Visit Number 5   Authorization - Number of Visits 10   OT Start Time 1440   OT Stop Time 1530   OT Time Calculation (min) 50 min      Past Medical History:  Diagnosis Date  . Allergy    SEASONAL  . Anemia    Anemia-NOS / PMH, Dr Julien Nordmann  . Arthritis   . Breast cancer ([place])    stage I right breast  . Cataract    BILATERAL  . Decreased vision    R eye  . Esophageal Stricture   . Hyperlipidemia   . Monoclonal gammopathy   . MVP (mitral valve prolapse)   . Osteoporosis   . Parkinson disease ([place])   . Skin cancer    basal cell L neck  . Vertical diplopia     Past Surgical History:  Procedure Laterality Date  . APPENDECTOMY  2002  . BONE BIOPSY  11/06/10  . BREAST LUMPECTOMY  02/2010   right breast lumpectomy and sentinel node biopsy  . CARDIAC CATHETERIZATION  2005   neg  . COLONOSCOPY     negative   . HEMORRHOID SURGERY  1970's  . SALPINGOOPHORECTOMY  2002   for benign growths  . SHOULDER SURGERY Right   . TOTAL ABDOMINAL HYSTERECTOMY W/ BILATERAL SALPINGOOPHORECTOMY  76 yrs old   for pain,prolapse ; G3 P2  . UPPER GASTROINTESTINAL ENDOSCOPY      There were no vitals filed for this visit.      Subjective Assessment - 05/05/16 1653    Subjective  Denies pain   Pertinent History hx of R breast CA with lumpectomy and lymph node removal and radiation 2012; multiple myeloma, osteoporesis   Currently in Pain? No/denies        supine PWR! Rock x 20 reps followed by closed chain chest press and shoulder flexion and diagonals with medium ball, min v.c. Standing for dynamic step and reach toss scarves to targets min v.c. For intensity, followed by steeping forwards and backwards with overhead reaching to place large pegs in vertical pegboard, min v.c., improved performance today. Therapist instructed pt in adapted strategy for fastening buttons and donning/ doffing jacket, pt returned demonstration following therapist demo and v.c.                       OT Education - 05/04/16 1704    Education provided Yes   Education Details coordination HEP   Person(s) Educated Patient   Methods Explanation;Demonstration;Verbal cues;Handout   Comprehension Verbalized understanding;Returned demonstration;Verbal cues required          OT Short Term Goals - 04/19/16 1557      OT SHORT TERM GOAL #1   Title Pt will be independent with initial HEP--check STGs 05/19/16   Time 4   Period Weeks   Status New     OT SHORT TERM GOAL #2   Title Pt will demo at least 110*  R shoulder flex with 3/10 pain or less.   Baseline 100, pain 5/10   Time 4   Period Weeks   Status New     OT SHORT TERM GOAL #3   Title Pt will verbalize understanding of adapted strategies for ADLs/ IADLS to minimize pain and maximize independence.   Time 4   Period Weeks   Status New     OT SHORT TERM GOAL #4   Title Pt will demo at least 85* R shoulder abduction for ADLs.   Baseline 78   Time 4   Period Weeks   Status New           OT Long Term Goals - 04/19/16 1604      OT LONG TERM GOAL #1   Title Pt will demonstrate improved ability to fasten buttons as evidenced by decreasing 3 button/ unbutton to 25 secs or less.due 06/17/16   Baseline 29.06 secs   Time 8   Period Weeks   Status New     OT LONG TERM GOAL #2   Title Pt will demonstrate improved ability to donn/ doff a jacket as evidenced by decreasing PPT#4 to  16 secs or less   Baseline 19.53 secs   Status New     OT LONG TERM GOAL #3   Title Pt will improve balance for ADLs/IADLs as shown by improving standing functional reach test to 10 inches for LUE   Baseline RUE 10 inches, LUE 9 inches   Time 8   Period Weeks   Status New     OT LONG TERM GOAL #4   Title Pt will report RUE pain 3/10 or less for performance of ADLS.   Time 8   Period Weeks   Status New     OT LONG TERM GOAL #5   Title ---------------------------------------------------               Plan - 05/05/16 1442    Clinical Impression Statement 1      Patient will benefit from skilled therapeutic intervention in order to improve the following deficits and impairments:     Visit Diagnosis: Other symptoms and signs involving the nervous system  Other symptoms and signs involving the musculoskeletal system  Acute pain of right shoulder  Stiffness of right shoulder, not elsewhere classified  Abnormal posture    Problem List Patient Active Problem List   Diagnosis Date Noted  . Acute non-recurrent sinusitis 12/30/2015  . Headache 05/11/2015  . Globus sensation 04/24/2015  . Foot pain 02/25/2015  . Shoulder pain 04/17/2014  . PD (Parkinson's disease) ([place]) 06/05/2013  . Medicare annual wellness visit, subsequent 05/02/2012  . Osteoporosis 03/08/2011  . Breast cancer ([place]) 11/19/2010  . GERD (gastroesophageal reflux disease) 08/01/2008  . DYSPHAGIA UNSPECIFIED 08/01/2008  . ORTHOSTATIC HYPOTENSION 06/28/2008  . HLD (hyperlipidemia) 07/10/2007  . MONOCLONAL GAMMOPATHY 07/10/2007  . ANEMIA-NOS 07/10/2007  . FATIGUE 07/10/2007    RINE,KATHRYN 05/05/2016, 4:59 PM  [facility] [address] [place], [name], [address] Phone: [phone]   Fax:  [phone]  Name: [name] MRN: [id number] Date of Birth: 07-14-1940

## 2016-05-07 NOTE — Patient Instructions (Signed)
Keeping Thinking Skills Sharp: 1. Jigsaw puzzles 2. Card/board games 3. Talking on the phone/social events 4. [email or web address] 5. Online games 6. Word searches/crossword puzzles 7.  Logic puzzles 8. Aerobic exercise (stationary bike) 9. Eating balanced diet (fruits & veggies) 10. Drink water 11. Try something new--new recipe, hobby 12. Crafts 13. Do a variety of activities that are challenging

## 2016-05-11 ENCOUNTER — Encounter: Payer: Medicare Other | Admitting: Occupational Therapy

## 2016-05-12 ENCOUNTER — Ambulatory Visit: Payer: Medicare Other | Admitting: Occupational Therapy

## 2016-05-12 DIAGNOSIS — M25511 Pain in right shoulder: Secondary | ICD-10-CM | POA: Diagnosis not present

## 2016-05-12 DIAGNOSIS — R29898 Other symptoms and signs involving the musculoskeletal system: Secondary | ICD-10-CM | POA: Diagnosis not present

## 2016-05-12 DIAGNOSIS — R29818 Other symptoms and signs involving the nervous system: Secondary | ICD-10-CM | POA: Diagnosis not present

## 2016-05-12 DIAGNOSIS — R293 Abnormal posture: Secondary | ICD-10-CM

## 2016-05-12 DIAGNOSIS — M25611 Stiffness of right shoulder, not elsewhere classified: Secondary | ICD-10-CM | POA: Diagnosis not present

## 2016-05-12 DIAGNOSIS — R2689 Other abnormalities of gait and mobility: Secondary | ICD-10-CM | POA: Diagnosis not present

## 2016-05-12 NOTE — Therapy (Signed)
[facility] [address] [place], [name], [address] Phone: [phone]   Fax:  [phone]  Occupational Therapy Treatment  Patient Details  Name: [name] MRN: [id number] Date of Birth: [birth year] Referring Provider: Dr. Carles Collet  Encounter Date: 05/12/2016      OT End of Session - 05/12/16 1230    Visit Number 6   Number of Visits 17   Date for OT Re-Evaluation 06/17/16   Authorization Type Medicare   Authorization Time Period 60 days   Authorization - Visit Number 6   Authorization - Number of Visits 10   OT Start Time 6734   OT Stop Time 1230   OT Time Calculation (min) 42 min   Activity Tolerance Patient tolerated treatment well   Behavior During Therapy [facility] for tasks assessed/performed      Past Medical History:  Diagnosis Date  . Allergy    SEASONAL  . Anemia    Anemia-NOS / PMH, Dr Julien Nordmann  . Arthritis   . Breast cancer ([place])    stage I right breast  . Cataract    BILATERAL  . Decreased vision    R eye  . Esophageal Stricture   . Hyperlipidemia   . Monoclonal gammopathy   . MVP (mitral valve prolapse)   . Osteoporosis   . Parkinson disease ([place])   . Skin cancer    basal cell L neck  . Vertical diplopia     Past Surgical History:  Procedure Laterality Date  . APPENDECTOMY  2002  . BONE BIOPSY  11/06/10  . BREAST LUMPECTOMY  02/2010   right breast lumpectomy and sentinel node biopsy  . CARDIAC CATHETERIZATION  2005   neg  . COLONOSCOPY     negative   . HEMORRHOID SURGERY  1970's  . SALPINGOOPHORECTOMY  2002   for benign growths  . SHOULDER SURGERY Right   . TOTAL ABDOMINAL HYSTERECTOMY W/ BILATERAL SALPINGOOPHORECTOMY  76 yrs old   for pain,prolapse ; G3 P2  . UPPER GASTROINTESTINAL ENDOSCOPY      There were no vitals filed for this visit.      Subjective Assessment - 05/12/16 1152    Pertinent History hx of R breast CA with lumpectomy and lymph node removal and  radiation 2012; multiple myeloma, osteoporesis   Patient Stated Goals decrease shoulder pain and improve right arm use   Currently in Pain? Yes   Pain Score 2    Pain Location Arm   Pain Orientation Right   Pain Descriptors / Indicators Aching   Pain Type Chronic pain           Treatment: supine PWR! up, rock and twist x 10-20 reps each,  Reviewed strategies for donning/ doffing jacket with adapted strategy, pt returned demonstration. Arm bike x 6 mins level 1 for conditioning, pt maintained 40 rpm. Fine motor coordination activities with large amplitude movements: flipping dealing cards, then stacking coins, manipulating in hand to place in bank, min v.c. Pt was instructed in theraband HEP, see pt instructions, she returned demo. After min -mod v.c.                     OT Short Term Goals - 05/12/16 1229      OT SHORT TERM GOAL #1   Title Pt will be independent with initial HEP--check STGs 05/19/16   Time 4   Period Weeks   Status Achieved     OT SHORT TERM GOAL #2  Title Pt will demo at least 110* R shoulder flex with 3/10 pain or less.   Baseline 100, pain 5/10   Time 4   Period Weeks   Status On-going     OT SHORT TERM GOAL #3   Title Pt will verbalize understanding of adapted strategies for ADLs/ IADLS to minimize pain and maximize independence.   Time 4   Period Weeks   Status On-going     OT SHORT TERM GOAL #4   Title Pt will demo at least 85* R shoulder abduction for ADLs.   Baseline 78   Time 4   Period Weeks   Status On-going           OT Long Term Goals - 05/12/16 1203      OT LONG TERM GOAL #1   Title Pt will demonstrate improved ability to fasten buttons as evidenced by decreasing 3 button/ unbutton to 25 secs or less.due 06/17/16   Baseline 29.06 secs   Time 8   Period Weeks   Status New     OT LONG TERM GOAL #2   Title Pt will demonstrate improved ability to donn/ doff a jacket as evidenced by decreasing PPT#4 to 16 secs  or less   Baseline 19.53 secs   Status Achieved  7.19 secs     OT LONG TERM GOAL #3   Title Pt will improve balance for ADLs/IADLs as shown by improving standing functional reach test to 10 inches for LUE   Baseline RUE 10 inches, LUE 9 inches   Time 8   Period Weeks   Status New     OT LONG TERM GOAL #4   Title Pt will report RUE pain 3/10 or less for performance of ADLS.   Time 8   Period Weeks   Status New     OT LONG TERM GOAL #5   Title ---------------------------------------------------               Plan - 05/12/16 1222    Clinical Impression Statement Pt is progressing towards goals . She demonstrates improved ability to donn/ doff jacket using adapted strategy.    Rehab Potential Good   OT Frequency 2x / week   OT Duration 8 weeks   Plan functional reaching, check on theraband HEP   Consulted and Agree with Plan of Care Patient      Patient will benefit from skilled therapeutic intervention in order to improve the following deficits and impairments:  Abnormal gait, Decreased cognition, Decreased knowledge of use of DME, Impaired flexibility, Pain, Decreased coordination, Impaired tone, Decreased strength, Decreased range of motion, Decreased endurance, Decreased activity tolerance, Decreased balance, Decreased knowledge of precautions, Decreased safety awareness, Difficulty walking, Impaired UE functional use  Visit Diagnosis: Other symptoms and signs involving the nervous system  Other symptoms and signs involving the musculoskeletal system  Acute pain of right shoulder  Stiffness of right shoulder, not elsewhere classified  Abnormal posture    Problem List Patient Active Problem List   Diagnosis Date Noted  . Acute non-recurrent sinusitis 12/30/2015  . Headache 05/11/2015  . Globus sensation 04/24/2015  . Foot pain 02/25/2015  . Shoulder pain 04/17/2014  . PD (Parkinson's disease) ([place]) 06/05/2013  . Medicare annual wellness visit, subsequent  05/02/2012  . Osteoporosis 03/08/2011  . Breast cancer ([place]) 11/19/2010  . GERD (gastroesophageal reflux disease) 08/01/2008  . DYSPHAGIA UNSPECIFIED 08/01/2008  . ORTHOSTATIC HYPOTENSION 06/28/2008  . HLD (hyperlipidemia) 07/10/2007  . MONOCLONAL GAMMOPATHY 07/10/2007  .  ANEMIA-NOS 07/10/2007  . FATIGUE 07/10/2007    RINE,KATHRYN 05/12/2016, 1:03 PM  [facility] [address] [place], [name], [address] Phone: [phone]   Fax:  [phone]  Name: NYIMAH SHADDUCK MRN: [id number] Date of Birth: [birth year]

## 2016-05-12 NOTE — Patient Instructions (Signed)
   Strengthening: Resisted Extension   Hold tubing in __both__ hand(s), arm forward. Pull arm back, elbow straight. Repeat _10___ times per set. Do _1-2___ sessions per day, every other day.  Repeat with elbows bent, squeeze shoulder blades together, do not hike shoulders, 10 reps every other day.

## 2016-05-17 DIAGNOSIS — H2511 Age-related nuclear cataract, right eye: Secondary | ICD-10-CM | POA: Diagnosis not present

## 2016-05-17 DIAGNOSIS — H53031 Strabismic amblyopia, right eye: Secondary | ICD-10-CM | POA: Diagnosis not present

## 2016-05-17 DIAGNOSIS — H2512 Age-related nuclear cataract, left eye: Secondary | ICD-10-CM | POA: Diagnosis not present

## 2016-05-18 ENCOUNTER — Ambulatory Visit: Payer: Medicare Other | Admitting: Occupational Therapy

## 2016-05-18 DIAGNOSIS — M25611 Stiffness of right shoulder, not elsewhere classified: Secondary | ICD-10-CM

## 2016-05-18 DIAGNOSIS — R29818 Other symptoms and signs involving the nervous system: Secondary | ICD-10-CM | POA: Diagnosis not present

## 2016-05-18 DIAGNOSIS — M25511 Pain in right shoulder: Secondary | ICD-10-CM | POA: Diagnosis not present

## 2016-05-18 DIAGNOSIS — R293 Abnormal posture: Secondary | ICD-10-CM | POA: Diagnosis not present

## 2016-05-18 DIAGNOSIS — R278 Other lack of coordination: Secondary | ICD-10-CM

## 2016-05-18 DIAGNOSIS — R29898 Other symptoms and signs involving the musculoskeletal system: Secondary | ICD-10-CM

## 2016-05-18 DIAGNOSIS — R2689 Other abnormalities of gait and mobility: Secondary | ICD-10-CM | POA: Diagnosis not present

## 2016-05-18 NOTE — Therapy (Signed)
[facility] [address] [place], [name], [address] Phone: [phone]   Fax:  [phone]  Occupational Therapy Treatment  Patient Details  Name: [name] MRN: [id number] Date of Birth: 1940/04/12 Referring Provider: Dr. Carles Collet  Encounter Date: 05/18/2016      OT End of Session - 05/18/16 1750    Visit Number 7   Number of Visits 17   Date for OT Re-Evaluation 06/17/16   Authorization Type Medicare   Authorization Time Period 60 days   Authorization - Visit Number 6   Authorization - Number of Visits 10   OT Start Time 1318   OT Stop Time 1400   OT Time Calculation (min) 42 min   Activity Tolerance Patient tolerated treatment well   Behavior During Therapy [facility] for tasks assessed/performed      Past Medical History:  Diagnosis Date  . Allergy    SEASONAL  . Anemia    Anemia-NOS / PMH, Dr Julien Nordmann  . Arthritis   . Breast cancer ([place])    stage I right breast  . Cataract    BILATERAL  . Decreased vision    R eye  . Esophageal Stricture   . Hyperlipidemia   . Monoclonal gammopathy   . MVP (mitral valve prolapse)   . Osteoporosis   . Parkinson disease ([place])   . Skin cancer    basal cell L neck  . Vertical diplopia     Past Surgical History:  Procedure Laterality Date  . APPENDECTOMY  2002  . BONE BIOPSY  11/06/10  . BREAST LUMPECTOMY  02/2010   right breast lumpectomy and sentinel node biopsy  . CARDIAC CATHETERIZATION  2005   neg  . COLONOSCOPY     negative   . HEMORRHOID SURGERY  1970's  . SALPINGOOPHORECTOMY  2002   for benign growths  . SHOULDER SURGERY Right   . TOTAL ABDOMINAL HYSTERECTOMY W/ BILATERAL SALPINGOOPHORECTOMY  76 yrs old   for pain,prolapse ; G3 P2  . UPPER GASTROINTESTINAL ENDOSCOPY      There were no vitals filed for this visit.      Subjective Assessment - 05/18/16 1746    Subjective  Denies pain   Pertinent History hx of R breast CA with lumpectomy and  lymph node removal and radiation 2012; multiple myeloma, osteoporesis   Patient Stated Goals decrease shoulder pain and improve right arm use   Currently in Pain? No/denies          Arm bike x 6 mins level 1 for conditioning, pt maintained 40 rpm Supine, PWR! twist and PWR! Rock followed by shoulder flexion closed chain supine and seated with ball. Dynamic step and reach forwards and backwards and with trunk rotation to toss scarves to targets and to place large pegs in vertical pegboard min- mod v.c                        OT Short Term Goals - 05/18/16 1348      OT SHORT TERM GOAL #1   Title Pt will be independent with initial HEP--check STGs 05/19/16   Time 4   Period Weeks   Status Achieved     OT SHORT TERM GOAL #2   Title Pt will demo at least 110* R shoulder flex with 3/10 pain or less.   Baseline 100, pain 5/10   Time 4   Period Weeks   Status Achieved  125 with no pain  OT SHORT TERM GOAL #3   Title Pt will verbalize understanding of adapted strategies for ADLs/ IADLS to minimize pain and maximize independence.   Time 4   Period Weeks   Status Achieved     OT SHORT TERM GOAL #4   Title Pt will demo at least 85* R shoulder abduction for ADLs.   Baseline 78   Time 4   Period Weeks   Status Achieved  110           OT Long Term Goals - 05/12/16 1203      OT LONG TERM GOAL #1   Title Pt will demonstrate improved ability to fasten buttons as evidenced by decreasing 3 button/ unbutton to 25 secs or less.due 06/17/16   Baseline 29.06 secs   Time 8   Period Weeks   Status New     OT LONG TERM GOAL #2   Title Pt will demonstrate improved ability to donn/ doff a jacket as evidenced by decreasing PPT#4 to 16 secs or less   Baseline 19.53 secs   Status Achieved  7.19 secs     OT LONG TERM GOAL #3   Title Pt will improve balance for ADLs/IADLs as shown by improving standing functional reach test to 10 inches for LUE   Baseline RUE 10  inches, LUE 9 inches   Time 8   Period Weeks   Status New     OT LONG TERM GOAL #4   Title Pt will report RUE pain 3/10 or less for performance of ADLS.   Time 8   Period Weeks   Status New     OT LONG TERM GOAL #5   Title ---------------------------------------------------               Plan - 05/18/16 1748    Clinical Impression Statement Pt is progressing towards goasl. She demonstrates improved  functional reach and shoulder abduction.   Rehab Potential Good   OT Frequency 2x / week   OT Duration 8 weeks   OT Treatment/Interventions Self-care/ADL training;Moist Heat;Fluidtherapy;DME and/or AE instruction;Patient/family education;Balance training;Therapeutic exercises;Contrast Bath;Ultrasound;Therapeutic exercise;Therapeutic activities;Cognitive remediation/compensation;Passive range of motion;Functional Mobility Training;Neuromuscular education;Cryotherapy;Energy conservation;Manual Therapy;Dry needling;Visual/perceptual remediation/compensation   Plan check theraband HEP   Consulted and Agree with Plan of Care Patient      Patient will benefit from skilled therapeutic intervention in order to improve the following deficits and impairments:  Abnormal gait, Decreased cognition, Decreased knowledge of use of DME, Impaired flexibility, Pain, Decreased coordination, Impaired tone, Decreased strength, Decreased range of motion, Decreased endurance, Decreased activity tolerance, Decreased balance, Decreased knowledge of precautions, Decreased safety awareness, Difficulty walking, Impaired UE functional use  Visit Diagnosis: Other symptoms and signs involving the nervous system  Other symptoms and signs involving the musculoskeletal system  Acute pain of right shoulder  Stiffness of right shoulder, not elsewhere classified  Abnormal posture  Other abnormalities of gait and mobility  Other lack of coordination    Problem List Patient Active Problem List   Diagnosis  Date Noted  . Acute non-recurrent sinusitis 12/30/2015  . Headache 05/11/2015  . Globus sensation 04/24/2015  . Foot pain 02/25/2015  . Shoulder pain 04/17/2014  . PD (Parkinson's disease) ([place]) 06/05/2013  . Medicare annual wellness visit, subsequent 05/02/2012  . Osteoporosis 03/08/2011  . Breast cancer ([place]) 11/19/2010  . GERD (gastroesophageal reflux disease) 08/01/2008  . DYSPHAGIA UNSPECIFIED 08/01/2008  . ORTHOSTATIC HYPOTENSION 06/28/2008  . HLD (hyperlipidemia) 07/10/2007  . MONOCLONAL GAMMOPATHY 07/10/2007  . ANEMIA-NOS 07/10/2007  .  FATIGUE 07/10/2007    RINE,KATHRYN 05/18/2016, 5:52 PM  Theone Murdoch, OTR/L [phone] Phone: [phone] 5:56 PM 05/18/16  [facility] [address] [place], [name], [address] Phone: [phone]   Fax:  [phone]  Name: [name] MRN: [id number] Date of Birth: [birth year]

## 2016-05-20 ENCOUNTER — Ambulatory Visit: Payer: Medicare Other | Admitting: Occupational Therapy

## 2016-05-20 DIAGNOSIS — R29818 Other symptoms and signs involving the nervous system: Secondary | ICD-10-CM

## 2016-05-20 DIAGNOSIS — R293 Abnormal posture: Secondary | ICD-10-CM | POA: Diagnosis not present

## 2016-05-20 DIAGNOSIS — M25611 Stiffness of right shoulder, not elsewhere classified: Secondary | ICD-10-CM

## 2016-05-20 DIAGNOSIS — R29898 Other symptoms and signs involving the musculoskeletal system: Secondary | ICD-10-CM

## 2016-05-20 DIAGNOSIS — M25511 Pain in right shoulder: Secondary | ICD-10-CM | POA: Diagnosis not present

## 2016-05-20 DIAGNOSIS — R2689 Other abnormalities of gait and mobility: Secondary | ICD-10-CM | POA: Diagnosis not present

## 2016-05-20 NOTE — Therapy (Signed)
[facility] [address] [place], [name], [address] Phone: [phone]   Fax:  [phone]  Occupational Therapy Treatment  Patient Details  Name: [name] MRN: [id number] Date of Birth: July 05, 1940 Referring Provider: Dr. Carles Collet  Encounter Date: 05/20/2016      OT End of Session - 05/20/16 1631    Visit Number 8   Number of Visits 17   Date for OT Re-Evaluation 06/17/16   Authorization Type Medicare   Authorization Time Period 60 days   Authorization - Visit Number 8   Authorization - Number of Visits 10   OT Start Time 0349   OT Stop Time 1400   OT Time Calculation (min) 43 min   Activity Tolerance Patient tolerated treatment well   Behavior During Therapy [facility] for tasks assessed/performed      Past Medical History:  Diagnosis Date  . Allergy    SEASONAL  . Anemia    Anemia-NOS / PMH, Dr Julien Nordmann  . Arthritis   . Breast cancer ([place])    stage I right breast  . Cataract    BILATERAL  . Decreased vision    R eye  . Esophageal Stricture   . Hyperlipidemia   . Monoclonal gammopathy   . MVP (mitral valve prolapse)   . Osteoporosis   . Parkinson disease ([place])   . Skin cancer    basal cell L neck  . Vertical diplopia     Past Surgical History:  Procedure Laterality Date  . APPENDECTOMY  2002  . BONE BIOPSY  11/06/10  . BREAST LUMPECTOMY  02/2010   right breast lumpectomy and sentinel node biopsy  . CARDIAC CATHETERIZATION  2005   neg  . COLONOSCOPY     negative   . HEMORRHOID SURGERY  1970's  . SALPINGOOPHORECTOMY  2002   for benign growths  . SHOULDER SURGERY Right   . TOTAL ABDOMINAL HYSTERECTOMY W/ BILATERAL SALPINGOOPHORECTOMY  76 yrs old   for pain,prolapse ; G3 P2  . UPPER GASTROINTESTINAL ENDOSCOPY      There were no vitals filed for this visit.      Subjective Assessment - 05/20/16 1326    Subjective  reports mild shoulder pain   Pertinent History hx of R breast CA with  lumpectomy and lymph node removal and radiation 2012; multiple myeloma, osteoporesis   Patient Stated Goals decrease shoulder pain and improve right arm use   Currently in Pain? Yes   Pain Score 2    Pain Location Arm   Pain Orientation Right   Pain Type Chronic pain   Pain Onset More than a month ago   Pain Frequency Intermittent   Aggravating Factors  malpositioning   Pain Relieving Factors repositioning   Effect of Pain on Daily Activities limits functional use   Multiple Pain Sites No        Treatment: Supine, PWR! Up, twist and PWR! Rock followed by shoulder flexion closed chain supine, then with 2 lbs weight then seated diagonals with ball and shoulder flexion with 2 lbs weight, 10-20 reps for each exercise. Dynamic functional step and reach with bilateral UE's to retrieve items from over head cabinets with left and right UE's, min v.c. For big movements. Reviewed strategy for fastening buttons with adapted strategy, pt continues to demonstrate difficulty, min v.c. For PWR! hands, finger extension.  OT Short Term Goals - 05/18/16 1348      OT SHORT TERM GOAL #1   Title Pt will be independent with initial HEP--check STGs 05/19/16   Time 4   Period Weeks   Status Achieved     OT SHORT TERM GOAL #2   Title Pt will demo at least 110* R shoulder flex with 3/10 pain or less.   Baseline 100, pain 5/10   Time 4   Period Weeks   Status Achieved  125 with no pain     OT SHORT TERM GOAL #3   Title Pt will verbalize understanding of adapted strategies for ADLs/ IADLS to minimize pain and maximize independence.   Time 4   Period Weeks   Status Achieved     OT SHORT TERM GOAL #4   Title Pt will demo at least 85* R shoulder abduction for ADLs.   Baseline 78   Time 4   Period Weeks   Status Achieved  110           OT Long Term Goals - 05/12/16 1203      OT LONG TERM GOAL #1   Title Pt will demonstrate improved ability to  fasten buttons as evidenced by decreasing 3 button/ unbutton to 25 secs or less.due 06/17/16   Baseline 29.06 secs   Time 8   Period Weeks   Status New     OT LONG TERM GOAL #2   Title Pt will demonstrate improved ability to donn/ doff a jacket as evidenced by decreasing PPT#4 to 16 secs or less   Baseline 19.53 secs   Status Achieved  7.19 secs     OT LONG TERM GOAL #3   Title Pt will improve balance for ADLs/IADLs as shown by improving standing functional reach test to 10 inches for LUE   Baseline RUE 10 inches, LUE 9 inches   Time 8   Period Weeks   Status New     OT LONG TERM GOAL #4   Title Pt will report RUE pain 3/10 or less for performance of ADLS.   Time 8   Period Weeks   Status New     OT LONG TERM GOAL #5   Title ---------------------------------------------------               Plan - 05/20/16 1629    Clinical Impression Statement Pt is progressing towards goals. She demonstrates decreased pain and improved functional reach.   Rehab Potential Good   OT Frequency 2x / week   OT Duration 8 weeks   OT Treatment/Interventions Self-care/ADL training;Moist Heat;Fluidtherapy;DME and/or AE instruction;Patient/family education;Balance training;Therapeutic exercises;Contrast Bath;Ultrasound;Therapeutic exercise;Therapeutic activities;Cognitive remediation/compensation;Passive range of motion;Functional Mobility Training;Neuromuscular education;Cryotherapy;Energy conservation;Manual Therapy;Dry needling;Visual/perceptual remediation/compensation   Plan review theraband HEP, continue gentle  strengthening, work towards goals   Consulted and Agree with Plan of Care Patient      Patient will benefit from skilled therapeutic intervention in order to improve the following deficits and impairments:  Abnormal gait, Decreased cognition, Decreased knowledge of use of DME, Impaired flexibility, Pain, Decreased coordination, Impaired tone, Decreased strength, Decreased range of  motion, Decreased endurance, Decreased activity tolerance, Decreased balance, Decreased knowledge of precautions, Decreased safety awareness, Difficulty walking, Impaired UE functional use  Visit Diagnosis: Other symptoms and signs involving the nervous system  Other symptoms and signs involving the musculoskeletal system  Acute pain of right shoulder  Stiffness of right shoulder, not elsewhere classified  Abnormal posture    Problem List Patient Active  Problem List   Diagnosis Date Noted  . Acute non-recurrent sinusitis 12/30/2015  . Headache 05/11/2015  . Globus sensation 04/24/2015  . Foot pain 02/25/2015  . Shoulder pain 04/17/2014  . PD (Parkinson's disease) ([place]) 06/05/2013  . Medicare annual wellness visit, subsequent 05/02/2012  . Osteoporosis 03/08/2011  . Breast cancer ([place]) 11/19/2010  . GERD (gastroesophageal reflux disease) 08/01/2008  . DYSPHAGIA UNSPECIFIED 08/01/2008  . ORTHOSTATIC HYPOTENSION 06/28/2008  . HLD (hyperlipidemia) 07/10/2007  . MONOCLONAL GAMMOPATHY 07/10/2007  . ANEMIA-NOS 07/10/2007  . FATIGUE 07/10/2007    Williams Dietrick 05/20/2016, 4:35 PM  [facility] [address] [place], [name], [address] Phone: [phone]   Fax:  [phone]  Name: [name] MRN: [id number] Date of Birth: 1940-10-23

## 2016-05-20 NOTE — Progress Notes (Signed)
[name] was seen today in the movement disorders clinic for neurologic consultation at the request of [name], MD.  The consultation is for the evaluation of tremor.  The records that were made available to me were reviewed.  No one accompanies the patient.   The first symptom(s) the patient noticed was unilateral hand tremor on the L and this was 4-5 months ago.  She is R hand dominant.  She states that it was intermittent when it started but now it is more constant.  No tremor elsewhere.    Only living relative with PD is a cousin, who is currently being treated at [facility].  08/17/13 update:  Pt states that tremor is improved on levodopa but not sure if it is otherwise helping (but may be).  Takes med at 8am/1pm/5-7pm.  No SE.  Blood pressure has been low but states that it is always low.  No lightheadedness.  Balance is not good, but no falls.  She completed therapy.  Is exercising at home.  She walks an hr a day 6 days per week and when she goes to the gym, she will use the treadmill.  Has signed up for PD exercise class.   11/20/13 update:  Pt f/u re: PD.  Is on carbidopa/levodopa 25/100 tid.  Is doing well.  No falls.  No lightheadedness.  No hallucinations.  Exercising faithfully.   Is having an aching pain in the shoulder and in the thumb region on the R side but PD primarily affects her on the L.  Some occasional tremor on the L.    04/10/14 update:  Pt is on carbidopa/levodopa 25/100 tid.  She is stable in regards to PD but unfortunately her husband just died 72 days ago.  She just started back exercising with her sister in law.  No SI/HI.  No falls.  No lightheadeness.  Some tremor but no more than usual.  08/12/14 update:  Pt is on carbidopa/levodopa 25/100 three times per day.  She has done PT/OT/ST since our last visit and just completed her last therapy session on 08/01/14.  She had an injection in her shoulder as well and then had therapy for that and it is feeling better.   No falls since our last visit.  No lightheadedness.  She is exercising faithfully.  She is walking for exercise 6 times a week.  She admits that she is crying a lot and states that it started before her husband died and sometimes it is crying over inconsequential stuff.  Sometimes, she cries over her husbands death but doesn't want medication for that.  She is not SI/HI.  12/16/14 update:  Pt is following up today and is still on carbidopa/levodopa 25/100 tid.   No significant changes since last visit.  She goes to the gym 6 days a week.  Sometimes, she will go two times a day.   She is going to be spending thanksgiving with her son.    Wearing off:  No.  How long before next dose:  n/a Falls:   No. N/V:  No. (had some nausea this past weekend but thinks related to food she ate) Hallucinations:  No.  visual distortions: No. Lightheaded:  Yes.  , just last few days  Syncope: No. Dyskinesia:  No.   04/15/15 update:  The patient presents today for follow-up.  She has a history of Parkinson's disease.  She is currently on carbidopa/levodopa 25/100, 3 times per day.  Overall, she reports  that she is doing well.  She denies any falls.  No hallucinations.  No lightheadedness or near syncope.  She is still exercising.  She started occupational therapy for her shoulder pain.  I saw that speech therapy recommended that she see GI because she had her esophagus structure in the past.  She has an appointment with [name] on March 30.  She states that she is having a little problem swallowing but "it isn't bad yet."  States that she is crying for unknown reason (watching the news, etc).  Doesn't think that it is depression.  09/09/15 update:  The patient follows up today.  I have reviewed multiple records since her last visit.  She remains on carbidopa/levodopa 25/100, one tablet 3 times per day.  Last visit, I was going to start her on Nuedexta, but it turns out that this interfered with her tamoxifen.  We ended  up starting her on Effexor XR, 75 mg daily.  The patient states that the crying spells resolved with the effexor.  She did have a modified barium swallow on 04/21/2015 that was normal.  She continued to have a globus sensation and saw [name] for this.  He felt that she had mild oropharyngeal dysphagia related to Parkinson's, but that was not demonstrated on her barium swallow.  He did do a CT of the abdomen that was negative.  The patient did go to the emergency room on 05/07/2015 with headache.  That has since resolved.  She has also been to Dr. [name] with lightheadedness, and also described vertigo to the physical therapist and they recommended vestibular rehabilitation.  She has been going to rehabilitation therapy.   That did help.  She has not had any falls.  No syncopal episodes.  No falls.  She does tell me she had surgery on 7/18 by [name] for rotator cuff.  She is still in a sling.    10/15/15 update:  The patient follows up today.  She is on carbidopa/levodopa 25/100, one tablet 3 times per day.  She is also on Effexor XR, 75 mg daily.  She feels that mood has been good.  She has significant orthostatic hypotension, but I was leery about giving her medication for this last visit because she was complaining about a lot of chest pain.  Not long after that, she saw [name], who ordered an echocardiogram which was performed on 09/23/2015 and was unremarkable.  Ejection fraction was 55-60%.  She had a nuclear stress test and there was no significant reversible ischemia. LVEF 67% with normal wall motion on the stress Myoview.  She did fall this past Sunday and hit her head.  She was putting on underwear and tripped and her face hit the floor.  Everytime she chews, her L face and jaw hurt.  She also has a bilateral frontal headache.  "It feels like a regular headache like I have had before."   Was told that she couldn't do therapy yesterday until she saw her doctor.  L shoulder is a bit sore.  Admits  that she hasn't been drinking quite as much water.  11/18/15 update:  The patient follows up today.  She remains on carbidopa/levodopa 25/100, one tablet 3 times per day.  She did have a CT of the brain and maxillofacial regions right after our last visit, due to a fall.  Fortunately, there was no intracranial abnormalities and no facial fractures.  She has recovered nicely.  She has had no further  falls.  Re: lightheaded, feeling a "little dizzy" with standing.  Thinks dizziness has not been "bad."  Wears abdominal binder some but makes her "hot."    Her mood is doing good on Effexor XR, 75 mg daily.  "I don't cry any more all of the time."  Going to PT and she is walking for exercise.  Is back to drinking about 5-6 bottles water per day.  She has started to note tremor in the RUE but not as much as in the LUE  02/18/16 update:  Patient follows up today, on carbidopa/levodopa 25/100, one tablet 3 times per day.  Overall, the patient states she has been stable.  She has had several falls since our last visit.  One time, she was vacuuming and moving backward and fell.  Two times, she fell on a curb.  She also fell backing up in the bathroom and she hit the toilet and the bathtub.  She got a "little hurt."  The last time she was dreaming and she fell out of the bed and hit her head on the floor.  That was about a week ago.  Denies LOC/alternation in consciousness.   Last PT for PD at end of July.  Had therapy for shoulder in November after shoulder surgery.   Occasional lightheadedness but no near syncope.  Mood has been good on Effexor XR, 75 mg daily.  05/21/16 update:  Patient seen today in follow-up.  She is on carbidopa/levodopa 25/100, one tablet 3 times per day.  Last visit, we started clonazepam 0.5 mg, half tablet at night for REM behavior disorder.  Reports that this has helped without side effects.  "I can't even tell I take it except I stay in the bed."  She has had no "real" falls since our last  visit.  She does tell me she slid off the bed once when she reached to get her remote control.  She then tells me about a fall backwards but she actually fell backward on the bed.     Her mood remains good on Effexor.  No hallucinations.  No lightheadedness or near syncope.  She is exercising daily at the gym.  She is in PT now.    Neuroimaging has not previously been performed.  It was supposed to be done 06/01/13 but pt cancelled as she was claustrophobic and couldn't do the scan even though it was in an open unit.   PREVIOUS MEDICATIONS: none to date  ALLERGIES:   Allergies  Allergen Reactions  . Penicillins Shortness Of Breath and Itching    CURRENT MEDICATIONS:  Current Outpatient Prescriptions on File Prior to Visit  Medication Sig Dispense Refill  . aspirin 81 MG tablet Take 81 mg by mouth daily.      [name] b complex vitamins tablet Take 1 tablet by mouth daily.    . Calcium 1500 MG tablet Take 1,500 mg by mouth.      . cholecalciferol (VITAMIN D) 1000 UNITS tablet Take 5,000 Units by mouth daily.      . clonazePAM (KLONOPIN) 0.5 MG tablet Take 0.5 tablets (0.25 mg total) by mouth at bedtime. 15 tablet 3  . Denosumab (PROLIA [place]) Inject into the skin every 6 (six) months.    . Elastic Bandages & Supports (ABDOMINAL BINDER/ELASTIC SMALL) MISC 1 Device by Does not apply route daily. 1 each 0  . fluticasone (FLONASE) 50 MCG/ACT nasal spray Place 2 sprays into both nostrils daily. 16 g 1  . Multiple Vitamin (  MULTIVITAMIN) capsule 2 tabs po qd     . Omega-3 Fatty Acids (OMEGA 3 PO) 1 tab po qd     . omeprazole (PRILOSEC OTC) 20 MG tablet Take 1 tablet (20 mg total) by mouth daily. 90 tablet 3  . tamoxifen (NOLVADEX) 20 MG tablet TAKE ONE TABLET BY MOUTH ONCE DAILY 30 tablet 2  . traMADol (ULTRAM) 50 MG tablet TAKE ONE TO TWO TABLETS BY MOUTH AT BEDTIME AS NEEDED FOR PAIN 60 tablet 5  . venlafaxine XR (EFFEXOR-XR) 75 MG 24 hr capsule TAKE ONE CAPSULE BY MOUTH ONCE DAILY WITH  BREAKFAST 30  capsule 5  . vitamin B-12 (CYANOCOBALAMIN) 500 MCG tablet Take 2500 mcg daily.    . nitroGLYCERIN (NITROSTAT) 0.3 MG SL tablet Place 1 tablet (0.3 mg total) under the tongue every 5 (five) minutes as needed (max 3 doses). Reported on 04/07/2015 (Patient not taking: Reported on 04/12/2016) 25 tablet 3   No current facility-administered medications on file prior to visit.     PAST MEDICAL HISTORY:   Past Medical History:  Diagnosis Date  . Allergy    SEASONAL  . Anemia    Anemia-NOS / PMH, Dr Julien Nordmann  . Arthritis   . Breast cancer ([place])    stage I right breast  . Cataract    BILATERAL  . Decreased vision    R eye  . Esophageal Stricture   . Hyperlipidemia   . Monoclonal gammopathy   . MVP (mitral valve prolapse)   . Osteoporosis   . Parkinson disease ([place])   . Skin cancer    basal cell L neck  . Vertical diplopia     PAST SURGICAL HISTORY:   Past Surgical History:  Procedure Laterality Date  . APPENDECTOMY  2002  . BONE BIOPSY  11/06/10  . BREAST LUMPECTOMY  02/2010   right breast lumpectomy and sentinel node biopsy  . CARDIAC CATHETERIZATION  2005   neg  . COLONOSCOPY     negative   . HEMORRHOID SURGERY  1970's  . SALPINGOOPHORECTOMY  2002   for benign growths  . SHOULDER SURGERY Right   . TOTAL ABDOMINAL HYSTERECTOMY W/ BILATERAL SALPINGOOPHORECTOMY  76 yrs old   for pain,prolapse ; G3 P2  . UPPER GASTROINTESTINAL ENDOSCOPY      SOCIAL HISTORY:   Social History   Social History  . Marital status: Widowed    Spouse name: N/A  . Number of children: 2  . Years of education: N/A   Occupational History  . Retired    Social History Main Topics  . Smoking status: Never Smoker  . Smokeless tobacco: Never Used  . Alcohol use No  . Drug use: No  . Sexual activity: Not on file   Other Topics Concern  . Not on file   Social History Narrative   Occupation: Textile   Widowed 2016, husband had cancer, was married in 1959    Patient has never smoked.     Alcohol Use - no   Illicit Drug Use - no   Patient does not get regular exercise.    Daily Caffeine Use: 3 cups of coffee daily    FAMILY HISTORY:   Family Status  Relation Status  . Mother Deceased   heart disease  . Brother Alive   diabetes  . Brother Deceased   heart disease, renal failure  . Father Deceased   hepatitis  . Sister Alive   diabetes  . Brother Deceased   drowned as a  teenager  . Brother Alive   diabetes  . Brother Alive   diabetes  . Sister Alive   diabetes, liver disease (alcohol)  . Sister Alive   healthy  . Son Alive   sarcoidosis  . Daughter Alive   healthy  . Cousin Alive   Parkinsons disease  . Neg Hx     ROS:  A complete 10 system review of systems was obtained and was unremarkable apart from what is mentioned above.  PHYSICAL EXAMINATION:    VITALS:   Vitals:   05/21/16 1054  BP: 90/60  Pulse: 90  SpO2: 96%  Weight: 128 lb (58.1 kg)  Height: 5\' 5"  (1.651 m)   No data found.   GEN:  The patient appears stated age and is in NAD.   HEENT:  Normocephalic.  She has ecchymosis on the L cheek (covered with makeup).    The mucous membranes are moist. The superficial temporal arteries are without ropiness or tenderness. CV:  RRR Lungs:  CTAB Neck:  No bruits   Neurological examination:  Orientation: The patient is alert and oriented x3.  Cranial nerves: There is good facial symmetry. There is facial hypomimia. The speech is fluent and clear. Soft palate rises symmetrically and there is no tongue deviation. Hearing is intact to conversational tone. Sensation: Sensation is intact to light touch throughout Motor: Strength is 5/5 in the bilateral upper and lower extremities.   Shoulder shrug is equal and symmetric.  There is no pronator drift. Deep tendon reflexes: Deep tendon reflexes are 3/4 at the bilateral biceps, triceps, brachioradialis, patella and achilles. Plantar responses are downgoing bilaterally.  Movement  examination: Tone: There is normal tone in the upper extremity Abnormal movements: There is  tremor in the LUE.   Coordination:  There is decremation with with any form of RAMS, including alternating supination and pronation of the forearm, hand opening and closing, finger taps on the L.  Heel and toe taps on the L are good and RAMs on the right are good.   Gait and Station: The patient has no trouble getting OOC.  Walks well with good arm swing.  ASSESSMENT/PLAN:  1.  Idiopathic Parkinson's disease.  She was dx in May, 2015.    -I am going to slightly increase her to carbidopa/levodopa 25/100, 1.5 tablets tid.  Risks, benefits, side effects and alternative therapies were discussed.  The opportunity to ask questions was given and they were answered to the best of my ability.  The patient expressed understanding and willingness to follow the outlined treatment protocols.  -invited to PD symposium 2.  Depression  -continue effexor.  XR, 75 mg daily  Crying spells resolved with this this small dose.   Risks, benefits, side effects and alternative therapies were discussed.  The opportunity to ask questions was given and they were answered to the best of my ability.  The patient expressed understanding and willingness to follow the outlined treatment protocols.  Samples given.   4.  Dysphagia  -MBE on 04/21/15 was normal  -[name] did not think this was GI issue. 5.  Orthostatic hypotension  -Drinking more water now and feeling less dizzy  -Talked to her about Northera but orthostatics today are much better than in the past and worry about creating supine HTN.  Wear compression binder as much as possible, which she is doing.   6.  REM behavior disorder  -doing better with klonopin - 0.5 mg - 1/2 tablet at night.  7.  .Follow up is anticipated in the next 4 months, sooner should new neurologic issues arise.  Much greater than 50% of this visit was spent in counseling and coordinating care.  Total  face to face time:  25 min

## 2016-05-21 ENCOUNTER — Encounter: Payer: Self-pay | Admitting: Neurology

## 2016-05-21 ENCOUNTER — Ambulatory Visit (INDEPENDENT_AMBULATORY_CARE_PROVIDER_SITE_OTHER): Payer: Medicare Other | Admitting: Neurology

## 2016-05-21 ENCOUNTER — Other Ambulatory Visit: Payer: Self-pay | Admitting: Neurology

## 2016-05-21 VITALS — BP 90/60 | HR 90 | Ht 65.0 in | Wt 128.0 lb

## 2016-05-21 DIAGNOSIS — G20 Parkinson's disease: Secondary | ICD-10-CM | POA: Diagnosis not present

## 2016-05-21 DIAGNOSIS — F330 Major depressive disorder, recurrent, mild: Secondary | ICD-10-CM | POA: Diagnosis not present

## 2016-05-21 DIAGNOSIS — G20A1 Parkinson's disease without dyskinesia, without mention of fluctuations: Secondary | ICD-10-CM

## 2016-05-21 MED ORDER — CARBIDOPA-LEVODOPA 25-100 MG PO TABS: 1.5000 | ORAL_TABLET | Freq: Three times a day (TID) | ORAL | 1 refills | Status: DC

## 2016-05-24 ENCOUNTER — Other Ambulatory Visit: Payer: Self-pay | Admitting: Internal Medicine

## 2016-05-24 DIAGNOSIS — C50919 Malignant neoplasm of unspecified site of unspecified female breast: Secondary | ICD-10-CM

## 2016-05-25 ENCOUNTER — Ambulatory Visit: Payer: Medicare Other | Admitting: Occupational Therapy

## 2016-05-27 ENCOUNTER — Encounter: Payer: Self-pay | Admitting: Occupational Therapy

## 2016-05-28 ENCOUNTER — Encounter: Payer: Medicare Other | Admitting: Occupational Therapy

## 2016-05-31 ENCOUNTER — Ambulatory Visit: Payer: Medicare Other | Attending: Neurology | Admitting: Occupational Therapy

## 2016-05-31 DIAGNOSIS — R2681 Unsteadiness on feet: Secondary | ICD-10-CM | POA: Diagnosis not present

## 2016-05-31 DIAGNOSIS — R293 Abnormal posture: Secondary | ICD-10-CM | POA: Insufficient documentation

## 2016-05-31 DIAGNOSIS — R29898 Other symptoms and signs involving the musculoskeletal system: Secondary | ICD-10-CM | POA: Diagnosis not present

## 2016-05-31 DIAGNOSIS — M25611 Stiffness of right shoulder, not elsewhere classified: Secondary | ICD-10-CM

## 2016-05-31 DIAGNOSIS — R278 Other lack of coordination: Secondary | ICD-10-CM | POA: Insufficient documentation

## 2016-05-31 DIAGNOSIS — M25511 Pain in right shoulder: Secondary | ICD-10-CM | POA: Diagnosis not present

## 2016-05-31 DIAGNOSIS — R29818 Other symptoms and signs involving the nervous system: Secondary | ICD-10-CM

## 2016-05-31 DIAGNOSIS — R2689 Other abnormalities of gait and mobility: Secondary | ICD-10-CM | POA: Insufficient documentation

## 2016-05-31 NOTE — Therapy (Signed)
[facility] [address] [place], [name], [address] Phone: [phone]   Fax:  [phone]  Occupational Therapy Treatment  Patient Details  Name: [name] MRN: [id number] Date of Birth: 1940-07-16 Referring Provider: Dr. Carles Collet  Encounter Date: 05/31/2016      OT End of Session - 05/31/16 1155    Visit Number 9   Number of Visits 17   Date for OT Re-Evaluation 06/17/16   Authorization Type Medicare   Authorization Time Period 60 days   Authorization - Visit Number 9   Authorization - Number of Visits 10   OT Start Time 1151   OT Stop Time 1230   OT Time Calculation (min) 39 min   Activity Tolerance Patient tolerated treatment well   Behavior During Therapy [facility] for tasks assessed/performed      Past Medical History:  Diagnosis Date  . Allergy    SEASONAL  . Anemia    Anemia-NOS / PMH, Dr Julien Nordmann  . Arthritis   . Breast cancer ([place])    stage I right breast  . Cataract    BILATERAL  . Decreased vision    R eye  . Esophageal Stricture   . Hyperlipidemia   . Monoclonal gammopathy   . MVP (mitral valve prolapse)   . Osteoporosis   . Parkinson disease ([place])   . Skin cancer    basal cell L neck  . Vertical diplopia     Past Surgical History:  Procedure Laterality Date  . APPENDECTOMY  2002  . BONE BIOPSY  11/06/10  . BREAST LUMPECTOMY  02/2010   right breast lumpectomy and sentinel node biopsy  . CARDIAC CATHETERIZATION  2005   neg  . COLONOSCOPY     negative   . HEMORRHOID SURGERY  1970's  . SALPINGOOPHORECTOMY  2002   for benign growths  . SHOULDER SURGERY Right   . TOTAL ABDOMINAL HYSTERECTOMY W/ BILATERAL SALPINGOOPHORECTOMY  76 yrs old   for pain,prolapse ; G3 P2  . UPPER GASTROINTESTINAL ENDOSCOPY      There were no vitals filed for this visit.      Subjective Assessment - 05/31/16 1155    Subjective  Pt has catract surgery next Thursday.   Pertinent History hx of R breast  CA with lumpectomy and lymph node removal and radiation 2012; multiple myeloma, osteoporesis   Patient Stated Goals decrease shoulder pain and improve right arm use   Currently in Pain? No/denies   Pain Onset More than a month ago     In supine, AAROM shoulder flex, chest press, and diagonals to each side with BUEs with min cues for stretch.  In standing, functional reaching in diagonal pattern incorporating trunk rotation/wt. shift and PWR! reach to place large pegs in vertical pegboard with each hand.  Arm bike x20mn level 1 for reciprocal movement with cues/target of at least 40rpms for intensity while maintaining movement amplitude/reciprocal movement.   Pt maintained 42-46rpms (forward/backwards).  Fastening/unfastening buttons for incr bilateral hand coordination with min cues for PWR! Hands.  Began checking goals and discussing progress--see below.  Also R shoulder flex to 125* with 1/10 pain reported.  Recommended pt sleep "holding" pillow to support RUE when sleeping on L side due to reports of pain worse at night/in am.  Also review importance of keeping feet apart and not crossing feet for improved balance during functional tasks.  OT Education - 05/31/16 1227    Education Details Reviewed yellow theraband HEP   Person(s) Educated Patient   Methods Explanation;Demonstration   Comprehension Verbalized understanding;Returned demonstration          OT Short Term Goals - 05/18/16 1348      OT SHORT TERM GOAL #1   Title Pt will be independent with initial HEP--check STGs 05/19/16   Time 4   Period Weeks   Status Achieved     OT SHORT TERM GOAL #2   Title Pt will demo at least 110* R shoulder flex with 3/10 pain or less.   Baseline 100, pain 5/10   Time 4   Period Weeks   Status Achieved  125 with no pain     OT SHORT TERM GOAL #3   Title Pt will verbalize understanding of adapted strategies for ADLs/ IADLS to  minimize pain and maximize independence.   Time 4   Period Weeks   Status Achieved     OT SHORT TERM GOAL #4   Title Pt will demo at least 85* R shoulder abduction for ADLs.   Baseline 78   Time 4   Period Weeks   Status Achieved  110           OT Long Term Goals - 05/31/16 1204      OT LONG TERM GOAL #1   Title Pt will demonstrate improved ability to fasten buttons as evidenced by decreasing 3 button/ unbutton to 25 secs or less.due 06/17/16   Baseline 29.06 secs   Time 8   Period Weeks   Status New     OT LONG TERM GOAL #2   Title Pt will demonstrate improved ability to donn/ doff a jacket as evidenced by decreasing PPT#4 to 16 secs or less   Baseline 19.53 secs   Status Achieved  7.19 secs     OT LONG TERM GOAL #3   Title Pt will improve balance for ADLs/IADLs as shown by improving standing functional reach test to 10 inches for LUE   Baseline RUE 10 inches, LUE 9 inches   Time 8   Period Weeks   Status On-going  05/31/16:  L 9.5"     OT LONG TERM GOAL #4   Title Pt will report RUE pain 3/10 or less for performance of ADLS.   Time 8   Period Weeks   Status Achieved  05/31/16     OT LONG TERM GOAL #5   Title ---------------------------------------------------               Plan - 05/31/16 1201    Clinical Impression Statement Pt is progressing towards goals with decr pain and improved LUE functional reach.   Rehab Potential Good   Clinical Impairments Affecting Rehab Potential R shoulder pain   OT Frequency 2x / week   OT Duration 8 weeks   OT Treatment/Interventions Self-care/ADL training;Moist Heat;Fluidtherapy;DME and/or AE instruction;Patient/family education;Balance training;Therapeutic exercises;Contrast Bath;Ultrasound;Therapeutic exercise;Therapeutic activities;Cognitive remediation/compensation;Passive range of motion;Functional Mobility Training;Neuromuscular education;Cryotherapy;Energy conservation;Manual Therapy;Dry  needling;Visual/perceptual remediation/compensation   Plan continue gentle strengthening, work toward remaining goals, anticipate d/c in next 1-2 visits   OT Home Exercise Plan education provided:  initial HEP; 04/28/15 cane ex for shoulder abduction, ER, issued PWR! up, twist and rock in supine   Consulted and Agree with Plan of Care Patient      Patient will benefit from skilled therapeutic intervention in order to improve the following deficits and impairments:  Abnormal gait, Decreased  cognition, Decreased knowledge of use of DME, Impaired flexibility, Pain, Decreased coordination, Impaired tone, Decreased strength, Decreased range of motion, Decreased endurance, Decreased activity tolerance, Decreased balance, Decreased knowledge of precautions, Decreased safety awareness, Difficulty walking, Impaired UE functional use  Visit Diagnosis: Other symptoms and signs involving the nervous system  Other symptoms and signs involving the musculoskeletal system  Stiffness of right shoulder, not elsewhere classified  Abnormal posture  Other abnormalities of gait and mobility  Other lack of coordination  Unsteadiness on feet  Acute pain of right shoulder    Problem List Patient Active Problem List   Diagnosis Date Noted  . Acute non-recurrent sinusitis 12/30/2015  . Headache 05/11/2015  . Globus sensation 04/24/2015  . Foot pain 02/25/2015  . Shoulder pain 04/17/2014  . PD (Parkinson's disease) ([place]) 06/05/2013  . Medicare annual wellness visit, subsequent 05/02/2012  . Osteoporosis 03/08/2011  . Breast cancer ([place]) 11/19/2010  . GERD (gastroesophageal reflux disease) 08/01/2008  . DYSPHAGIA UNSPECIFIED 08/01/2008  . ORTHOSTATIC HYPOTENSION 06/28/2008  . HLD (hyperlipidemia) 07/10/2007  . MONOCLONAL GAMMOPATHY 07/10/2007  . ANEMIA-NOS 07/10/2007  . FATIGUE 07/10/2007    [facility] 05/31/2016, 12:29 PM  [facility] [address] [place], [name], [address] Phone: [phone]   Fax:  [phone]  Name: [name] MRN: [id number] Date of Birth: 1940-07-05

## 2016-06-02 ENCOUNTER — Ambulatory Visit: Payer: Medicare Other | Admitting: Occupational Therapy

## 2016-06-02 DIAGNOSIS — R293 Abnormal posture: Secondary | ICD-10-CM

## 2016-06-02 DIAGNOSIS — R29898 Other symptoms and signs involving the musculoskeletal system: Secondary | ICD-10-CM

## 2016-06-02 DIAGNOSIS — R2689 Other abnormalities of gait and mobility: Secondary | ICD-10-CM

## 2016-06-02 DIAGNOSIS — R29818 Other symptoms and signs involving the nervous system: Secondary | ICD-10-CM

## 2016-06-02 DIAGNOSIS — M25611 Stiffness of right shoulder, not elsewhere classified: Secondary | ICD-10-CM

## 2016-06-02 DIAGNOSIS — R278 Other lack of coordination: Secondary | ICD-10-CM | POA: Diagnosis not present

## 2016-06-02 DIAGNOSIS — M25511 Pain in right shoulder: Secondary | ICD-10-CM

## 2016-06-02 NOTE — Patient Instructions (Signed)
Continue to perform ball exercises laying on your back, with both arms, for:  chest press, raising arms overhead with elbows straight and diagonals to both sides, perform each exercise-10 reps 1 x day  Laying on your back-Hold a 1 lbs water bottle in your right hand, make 10 small circles in 1 direction then repeat with 10 small circles in the other direction Hold water bottle with elbow straight and raise arm overhead 10 reps, 1x day Stop if you have increased pain! If your shoulder pain continues to increase in the future, have Dr. Carles Collet send you back to therapy.

## 2016-06-03 NOTE — Therapy (Signed)
[facility] [address] [place], [name], [address] Phone: [phone]   Fax:  [phone]  Occupational Therapy Treatment  Patient Details  Name: [name] MRN: [id number] Date of Birth: [birth year] Referring Provider: Dr. Carles Collet  Encounter Date: 06/02/2016      OT End of Session - 06/02/16 1357    Visit Number 10   Number of Visits 17   Date for OT Re-Evaluation 06/17/16   Authorization Type Medicare   Authorization Time Period 60 days   Authorization - Visit Number 10   Authorization - Number of Visits 10   OT Start Time 1318   OT Stop Time 1356   OT Time Calculation (min) 38 min   Activity Tolerance Patient tolerated treatment well   Behavior During Therapy [facility] for tasks assessed/performed      Past Medical History:  Diagnosis Date  . Allergy    SEASONAL  . Anemia    Anemia-NOS / PMH, Dr Julien Nordmann  . Arthritis   . Breast cancer ([place])    stage I right breast  . Cataract    BILATERAL  . Decreased vision    R eye  . Esophageal Stricture   . Hyperlipidemia   . Monoclonal gammopathy   . MVP (mitral valve prolapse)   . Osteoporosis   . Parkinson disease ([place])   . Skin cancer    basal cell L neck  . Vertical diplopia     Past Surgical History:  Procedure Laterality Date  . APPENDECTOMY  2002  . BONE BIOPSY  11/06/10  . BREAST LUMPECTOMY  02/2010   right breast lumpectomy and sentinel node biopsy  . CARDIAC CATHETERIZATION  2005   neg  . COLONOSCOPY     negative   . HEMORRHOID SURGERY  1970's  . SALPINGOOPHORECTOMY  2002   for benign growths  . SHOULDER SURGERY Right   . TOTAL ABDOMINAL HYSTERECTOMY W/ BILATERAL SALPINGOOPHORECTOMY  76 yrs old   for pain,prolapse ; G3 P2  . UPPER GASTROINTESTINAL ENDOSCOPY      There were no vitals filed for this visit.      Subjective Assessment - 06/02/16 1320    Subjective  Pt agrees with plans for discharge   Pertinent History hx of R breast  CA with lumpectomy and lymph node removal and radiation 2012; multiple myeloma, osteoporesis   Patient Stated Goals decrease shoulder pain and improve right arm use   Currently in Pain? No/denies           Treatment: Therapist checked progress towards goals and discussed with pt. In supine, AAROM shoulder flex, chest press, and diagonals to each side with BUEs with min cues for stretch. Pt progressed to performing small circles with right arm at 90* while in supine, followed by shoulder flexion with 1 lbs weight while in supine. Therapist issued as HEP, pt verbalized understanding.Arm bike x34mn level 1 for reciprocal movement with cues/target of at least 40rpms for intensity while maintaining movement amplitude/reciprocal movement.   Pt maintained 40 rpm. Therapist reinforce importance of avoiding lifting heavier items with RUE particularly not lifing from elevated surface(ie: wood from a high wood pile) Therapist also discussed the importance of not overdoing things and to use caution as she returns to use of push mower. Therapist encouraged pt to stop if she has pain. Pt verbalized understanding.                 OT Short Term Goals - 05/18/16 1348  OT SHORT TERM GOAL #1   Title Pt will be independent with initial HEP--check STGs 05/19/16   Time 4   Period Weeks   Status Achieved     OT SHORT TERM GOAL #2   Title Pt will demo at least 110* R shoulder flex with 3/10 pain or less.   Baseline 100, pain 5/10   Time 4   Period Weeks   Status Achieved  125 with no pain     OT SHORT TERM GOAL #3   Title Pt will verbalize understanding of adapted strategies for ADLs/ IADLS to minimize pain and maximize independence.   Time 4   Period Weeks   Status Achieved     OT SHORT TERM GOAL #4   Title Pt will demo at least 85* R shoulder abduction for ADLs.   Baseline 78   Time 4   Period Weeks   Status Achieved  110           OT Long Term Goals - 06/02/16 1321       OT LONG TERM GOAL #1   Title Pt will demonstrate improved ability to fasten buttons as evidenced by decreasing 3 button/ unbutton to 25 secs or less.due 06/17/16   Baseline 29.06 secs   Time 8   Period Weeks   Status Achieved  20.59 secs     OT LONG TERM GOAL #2   Title Pt will demonstrate improved ability to donn/ doff a jacket as evidenced by decreasing PPT#4 to 16 secs or less   Baseline 19.53 secs   Status Achieved  7.19 secs     OT LONG TERM GOAL #3   Title Pt will improve balance for ADLs/IADLs as shown by improving standing functional reach test to 10 inches for LUE   Baseline RUE 10 inches, LUE 9 inches   Time 8   Period Weeks   Status Achieved  10.5 secs     OT LONG TERM GOAL #4   Title Pt will report RUE pain 3/10 or less for performance of ADLS.   Time 8   Period Weeks   Status Achieved  05/31/16     OT LONG TERM GOAL #5   Title ---------------------------------------------------               Plan - 06/03/16 1649    Clinical Impression Statement Pt demonstrates excellent progress and agrees with plans for d/c.   Rehab Potential Good   Clinical Impairments Affecting Rehab Potential R shoulder pain   OT Frequency 2x / week   OT Duration 8 weeks   OT Treatment/Interventions Self-care/ADL training;Moist Heat;Fluidtherapy;DME and/or AE instruction;Patient/family education;Balance training;Therapeutic exercises;Contrast Bath;Ultrasound;Therapeutic exercise;Therapeutic activities;Cognitive remediation/compensation;Passive range of motion;Functional Mobility Training;Neuromuscular education;Cryotherapy;Energy conservation;Manual Therapy;Dry needling;Visual/perceptual remediation/compensation   Plan d/c OT, screen scheduled for 6 mons   OT Home Exercise Plan education provided:  initial HEP; 04/28/15 cane ex for shoulder abduction, ER, issued PWR! up, twist and rock in supine   Consulted and Agree with Plan of Care Patient      Patient will benefit from skilled  therapeutic intervention in order to improve the following deficits and impairments:  Abnormal gait, Decreased cognition, Decreased knowledge of use of DME, Impaired flexibility, Pain, Decreased coordination, Impaired tone, Decreased strength, Decreased range of motion, Decreased endurance, Decreased activity tolerance, Decreased balance, Decreased knowledge of precautions, Decreased safety awareness, Difficulty walking, Impaired UE functional use  Visit Diagnosis: Other symptoms and signs involving the nervous system  Other symptoms and signs involving the  musculoskeletal system  Stiffness of right shoulder, not elsewhere classified  Abnormal posture  Other lack of coordination  Other abnormalities of gait and mobility  Acute pain of right shoulder      G-Codes - Jun 24, 2016 1357    Functional Assessment Tool Used (Outpatient only) 3 button/ unbutton 20 .59 secs, PPT#4: 7.19secs   Functional Limitation Self care   Self Care Goal Status ([id number]) At least 1 percent but less than 20 percent impaired, limited or restricted   Self Care Discharge Status [id number]) At least 1 percent but less than 20 percent impaired, limited or restricted     OCCUPATIONAL THERAPY DISCHARGE SUMMARY   Current functional level related to goals / functional outcomes: Pt made excellent progress and achieved all goals.    Remaining deficits: Decreased strength, rigidity, decreased coordination, decreased balance, abnormal posture.   Education / Equipment: Pt was educated regarding HEP, and adapted strategies for ADLS/IADLs. Pt verbalizes understanding of all education.  Plan: Patient agrees to discharge.  Patient goals were met. Patient is being discharged due to meeting the stated rehab goals.  ?????     Problem List Patient Active Problem List   Diagnosis Date Noted  . Acute non-recurrent sinusitis 12/30/2015  . Headache 05/11/2015  . Globus sensation 04/24/2015  . Foot pain 02/25/2015  . Shoulder  pain 04/17/2014  . PD (Parkinson's disease) ([place]) 06/05/2013  . Medicare annual wellness visit, subsequent 05/02/2012  . Osteoporosis 03/08/2011  . Breast cancer ([place]) 11/19/2010  . GERD (gastroesophageal reflux disease) 08/01/2008  . DYSPHAGIA UNSPECIFIED 08/01/2008  . ORTHOSTATIC HYPOTENSION 06/28/2008  . HLD (hyperlipidemia) 07/10/2007  . MONOCLONAL GAMMOPATHY 07/10/2007  . ANEMIA-NOS 07/10/2007  . FATIGUE 07/10/2007    [facility] 06/03/2016, 4:50 PM Theone Murdoch, OTR/L [phone] Phone: [phone] 4:58 PM 06/03/16 [facility] [address] [place], [name], [address] Phone: [phone]   Fax:  [phone]  Name: LIONA WENGERT MRN: [id number] Date of Birth: 1940-07-17

## 2016-06-08 ENCOUNTER — Encounter: Payer: Medicare Other | Admitting: Occupational Therapy

## 2016-06-10 DIAGNOSIS — H2511 Age-related nuclear cataract, right eye: Secondary | ICD-10-CM | POA: Diagnosis not present

## 2016-06-11 ENCOUNTER — Encounter: Payer: Medicare Other | Admitting: Occupational Therapy

## 2016-07-08 DIAGNOSIS — H2512 Age-related nuclear cataract, left eye: Secondary | ICD-10-CM | POA: Diagnosis not present

## 2016-07-19 ENCOUNTER — Ambulatory Visit (INDEPENDENT_AMBULATORY_CARE_PROVIDER_SITE_OTHER): Payer: Medicare Other | Admitting: Family Medicine

## 2016-07-19 ENCOUNTER — Encounter: Payer: Self-pay | Admitting: Family Medicine

## 2016-07-19 VITALS — BP 122/66 | HR 73 | Temp 98.4°F | Wt 127.0 lb

## 2016-07-19 DIAGNOSIS — R319 Hematuria, unspecified: Secondary | ICD-10-CM | POA: Diagnosis not present

## 2016-07-19 LAB — POC URINALSYSI DIPSTICK (AUTOMATED)
Bilirubin, UA: NEGATIVE
Glucose, UA: NEGATIVE
Ketones, UA: NEGATIVE
Leukocytes, UA: NEGATIVE
Nitrite, UA: NEGATIVE
Spec Grav, UA: 1.025 (ref 1.010–1.025)
Urobilinogen, UA: 0.2 E.U./dL
pH, UA: 6 (ref 5.0–8.0)

## 2016-07-19 MED ORDER — SULFAMETHOXAZOLE-TRIMETHOPRIM 400-80 MG PO TABS: 1.0000 | ORAL_TABLET | Freq: Two times a day (BID) | ORAL | 0 refills | Status: DC

## 2016-07-19 NOTE — Progress Notes (Signed)
Dysuria: noted gross blood in urine.  Some lower abd pain but not sig isolated pain with urination.   duration of symptoms: a few days.  Some better today.   abdominal pain: yes fevers:no back pain:no Vomiting:no  No h/o renal stones.  H/o UTI in the past.   S/p hysterectomy.    Meds, vitals, and allergies reviewed.   Per HPI unless specifically indicated in ROS section   GEN: nad, alert and oriented HEENT: mucous membranes moist NECK: supple CV: rrr.  PULM: ctab, no inc wob ABD: soft, +bs, suprapubic area mildly tender EXT: no edema SKIN: no acute rash BACK: no CVA pain

## 2016-07-19 NOTE — Patient Instructions (Signed)
Drink plenty of water and start the antibiotics today.  We'll contact you with your lab report.  Take care.   

## 2016-07-20 DIAGNOSIS — R319 Hematuria, unspecified: Secondary | ICD-10-CM | POA: Insufficient documentation

## 2016-07-20 LAB — URINE CULTURE

## 2016-07-20 NOTE — Assessment & Plan Note (Signed)
U/a d/w pt.   ucx pending.  Start septra.  Presumed cystitis.   She agrees with plan.

## 2016-07-21 ENCOUNTER — Telehealth: Payer: Self-pay | Admitting: Family Medicine

## 2016-07-21 NOTE — Telephone Encounter (Signed)
Called patient and lab results given to her.

## 2016-07-21 NOTE — Telephone Encounter (Signed)
Patient returned Regina's call. °

## 2016-08-09 ENCOUNTER — Other Ambulatory Visit: Payer: Self-pay | Admitting: Family Medicine

## 2016-08-10 NOTE — Telephone Encounter (Signed)
Electronic refill request. Tramadol Last office visit:   07/19/16 Last Filled:    60 tablet 5 02/03/2016  Please advise.

## 2016-08-11 NOTE — Telephone Encounter (Signed)
Rx called in to requested pharmacy 

## 2016-08-11 NOTE — Telephone Encounter (Signed)
Please call in.  Thanks.   

## 2016-09-03 ENCOUNTER — Other Ambulatory Visit: Payer: Self-pay | Admitting: Internal Medicine

## 2016-09-03 DIAGNOSIS — C50919 Malignant neoplasm of unspecified site of unspecified female breast: Secondary | ICD-10-CM

## 2016-09-15 DIAGNOSIS — H02413 Mechanical ptosis of bilateral eyelids: Secondary | ICD-10-CM | POA: Diagnosis not present

## 2016-09-22 ENCOUNTER — Ambulatory Visit: Payer: Medicare Other | Admitting: Neurology

## 2016-09-28 NOTE — Progress Notes (Signed)
[name] was seen today in the movement disorders clinic for neurologic consultation at the request of [name], MD.  The consultation is for the evaluation of tremor.  The records that were made available to me were reviewed.  No one accompanies the patient.   The first symptom(s) the patient noticed was unilateral hand tremor on the L and this was 4-5 months ago.  She is R hand dominant.  She states that it was intermittent when it started but now it is more constant.  No tremor elsewhere.    Only living relative with PD is a cousin, who is currently being treated at [facility].  08/17/13 update:  Pt states that tremor is improved on levodopa but not sure if it is otherwise helping (but may be).  Takes med at 8am/1pm/5-7pm.  No SE.  Blood pressure has been low but states that it is always low.  No lightheadedness.  Balance is not good, but no falls.  She completed therapy.  Is exercising at home.  She walks an hr a day 6 days per week and when she goes to the gym, she will use the treadmill.  Has signed up for PD exercise class.   11/20/13 update:  Pt f/u re: PD.  Is on carbidopa/levodopa 25/100 tid.  Is doing well.  No falls.  No lightheadedness.  No hallucinations.  Exercising faithfully.   Is having an aching pain in the shoulder and in the thumb region on the R side but PD primarily affects her on the L.  Some occasional tremor on the L.    04/10/14 update:  Pt is on carbidopa/levodopa 25/100 tid.  She is stable in regards to PD but unfortunately her husband just died 106 days ago.  She just started back exercising with her sister in law.  No SI/HI.  No falls.  No lightheadeness.  Some tremor but no more than usual.  08/12/14 update:  Pt is on carbidopa/levodopa 25/100 three times per day.  She has done PT/OT/ST since our last visit and just completed her last therapy session on 08/01/14.  She had an injection in her shoulder as well and then had therapy for that and it is feeling better.   No falls since our last visit.  No lightheadedness.  She is exercising faithfully.  She is walking for exercise 6 times a week.  She admits that she is crying a lot and states that it started before her husband died and sometimes it is crying over inconsequential stuff.  Sometimes, she cries over her husbands death but doesn't want medication for that.  She is not SI/HI.  12/16/14 update:  Pt is following up today and is still on carbidopa/levodopa 25/100 tid.   No significant changes since last visit.  She goes to the gym 6 days a week.  Sometimes, she will go two times a day.   She is going to be spending thanksgiving with her son.    Wearing off:  No.  How long before next dose:  n/a Falls:   No. N/V:  No. (had some nausea this past weekend but thinks related to food she ate) Hallucinations:  No.  visual distortions: No. Lightheaded:  Yes.  , just last few days  Syncope: No. Dyskinesia:  No.   04/15/15 update:  The patient presents today for follow-up.  She has a history of Parkinson's disease.  She is currently on carbidopa/levodopa 25/100, 3 times per day.  Overall, she  reports that she is doing well.  She denies any falls.  No hallucinations.  No lightheadedness or near syncope.  She is still exercising.  She started occupational therapy for her shoulder pain.  I saw that speech therapy recommended that she see GI because she had her esophagus structure in the past.  She has an appointment with [name] on March 30.  She states that she is having a little problem swallowing but "it isn't bad yet."  States that she is crying for unknown reason (watching the news, etc).  Doesn't think that it is depression.  09/09/15 update:  The patient follows up today.  I have reviewed multiple records since her last visit.  She remains on carbidopa/levodopa 25/100, one tablet 3 times per day.  Last visit, I was going to start her on Nuedexta, but it turns out that this interfered with her tamoxifen.  We ended  up starting her on Effexor XR, 75 mg daily.  The patient states that the crying spells resolved with the effexor.  She did have a modified barium swallow on 04/21/2015 that was normal.  She continued to have a globus sensation and saw [name] for this.  He felt that she had mild oropharyngeal dysphagia related to Parkinson's, but that was not demonstrated on her barium swallow.  He did do a CT of the abdomen that was negative.  The patient did go to the emergency room on 05/07/2015 with headache.  That has since resolved.  She has also been to Dr. [name] with lightheadedness, and also described vertigo to the physical therapist and they recommended vestibular rehabilitation.  She has been going to rehabilitation therapy.   That did help.  She has not had any falls.  No syncopal episodes.  No falls.  She does tell me she had surgery on 7/18 by [name] for rotator cuff.  She is still in a sling.    10/15/15 update:  The patient follows up today.  She is on carbidopa/levodopa 25/100, one tablet 3 times per day.  She is also on Effexor XR, 75 mg daily.  She feels that mood has been good.  She has significant orthostatic hypotension, but I was leery about giving her medication for this last visit because she was complaining about a lot of chest pain.  Not long after that, she saw [name], who ordered an echocardiogram which was performed on 09/23/2015 and was unremarkable.  Ejection fraction was 55-60%.  She had a nuclear stress test and there was no significant reversible ischemia. LVEF 67% with normal wall motion on the stress Myoview.  She did fall this past Sunday and hit her head.  She was putting on underwear and tripped and her face hit the floor.  Everytime she chews, her L face and jaw hurt.  She also has a bilateral frontal headache.  "It feels like a regular headache like I have had before."   Was told that she couldn't do therapy yesterday until she saw her doctor.  L shoulder is a bit sore.  Admits  that she hasn't been drinking quite as much water.  11/18/15 update:  The patient follows up today.  She remains on carbidopa/levodopa 25/100, one tablet 3 times per day.  She did have a CT of the brain and maxillofacial regions right after our last visit, due to a fall.  Fortunately, there was no intracranial abnormalities and no facial fractures.  She has recovered nicely.  She has had no  further falls.  Re: lightheaded, feeling a "little dizzy" with standing.  Thinks dizziness has not been "bad."  Wears abdominal binder some but makes her "hot."    Her mood is doing good on Effexor XR, 75 mg daily.  "I don't cry any more all of the time."  Going to PT and she is walking for exercise.  Is back to drinking about 5-6 bottles water per day.  She has started to note tremor in the RUE but not as much as in the LUE  02/18/16 update:  Patient follows up today, on carbidopa/levodopa 25/100, one tablet 3 times per day.  Overall, the patient states she has been stable.  She has had several falls since our last visit.  One time, she was vacuuming and moving backward and fell.  Two times, she fell on a curb.  She also fell backing up in the bathroom and she hit the toilet and the bathtub.  She got a "little hurt."  The last time she was dreaming and she fell out of the bed and hit her head on the floor.  That was about a week ago.  Denies LOC/alternation in consciousness.   Last PT for PD at end of July.  Had therapy for shoulder in November after shoulder surgery.   Occasional lightheadedness but no near syncope.  Mood has been good on Effexor XR, 75 mg daily.  05/21/16 update:  Patient seen today in follow-up.  She is on carbidopa/levodopa 25/100, one tablet 3 times per day.  Last visit, we started clonazepam 0.5 mg, half tablet at night for REM behavior disorder.  Reports that this has helped without side effects.  "I can't even tell I take it except I stay in the bed."  She has had no "real" falls since our last  visit.  She does tell me she slid off the bed once when she reached to get her remote control.  She then tells me about a fall backwards but she actually fell backward on the bed.     Her mood remains good on Effexor.  No hallucinations.  No lightheadedness or near syncope.  She is exercising daily at the gym.  She is in PT now.    09/29/16 update:  Patient seen in follow-up for her Parkinson's disease.  I increased her carbidopa/levodopa 25/100 last visit, so that she is now taking 1-1/2 tablets 3 times per day.  She states that she noted no big difference.  She is having no dyskinesia.  She is on clonazepam 0.5 mg, half tablet at night for REM behavior disorder.  This has worked well but she wonders if it causes hangover effects.   Her mood has been good with low dose Effexor, 75 mg daily.  She had cataracts extracted and she is happy with the results.  Dizziness has improved.  Not as much exercise as gym she was using closed.  Neuroimaging has not previously been performed.  It was supposed to be done 06/01/13 but pt cancelled as she was claustrophobic and couldn't do the scan even though it was in an open unit.   PREVIOUS MEDICATIONS: none to date  ALLERGIES:   Allergies  Allergen Reactions  . Penicillins Shortness Of Breath and Itching    CURRENT MEDICATIONS:  Current Outpatient Prescriptions on File Prior to Visit  Medication Sig Dispense Refill  . aspirin 81 MG tablet Take 81 mg by mouth daily.      [name] b complex vitamins tablet Take 1  tablet by mouth daily.    . Calcium 1500 MG tablet Take 1,500 mg by mouth.      . carbidopa-levodopa (SINEMET IR) 25-100 MG tablet Take 1.5 tablets by mouth 3 (three) times daily. 405 tablet 1  . cholecalciferol (VITAMIN D) 1000 UNITS tablet Take 5,000 Units by mouth daily.      . clonazePAM (KLONOPIN) 0.5 MG tablet Take 0.5 tablets (0.25 mg total) by mouth at bedtime. 15 tablet 3  . Denosumab (PROLIA [place]) Inject into the skin every 6 (six) months.    . Elastic  Bandages & Supports (ABDOMINAL BINDER/ELASTIC SMALL) MISC 1 Device by Does not apply route daily. 1 each 0  . fluticasone (FLONASE) 50 MCG/ACT nasal spray Place 2 sprays into both nostrils daily. 16 g 1  . Multiple Vitamin (MULTIVITAMIN) capsule 2 tabs po qd     . Omega-3 Fatty Acids (OMEGA 3 PO) 1 tab po qd     . omeprazole (PRILOSEC OTC) 20 MG tablet Take 1 tablet (20 mg total) by mouth daily. 90 tablet 3  . tamoxifen (NOLVADEX) 20 MG tablet TAKE 1 TABLET BY MOUTH ONCE DAILY 30 tablet 2  . traMADol (ULTRAM) 50 MG tablet TAKE ONE TO TWO TABLETS BY MOUTH AT BEDTIME AS NEEDED FOR PAIN 60 tablet 5  . venlafaxine XR (EFFEXOR-XR) 75 MG 24 hr capsule TAKE ONE CAPSULE BY MOUTH ONCE DAILY WITH  BREAKFAST 30 capsule 5  . vitamin B-12 (CYANOCOBALAMIN) 500 MCG tablet Take 2500 mcg daily.    . nitroGLYCERIN (NITROSTAT) 0.3 MG SL tablet Place 1 tablet (0.3 mg total) under the tongue every 5 (five) minutes as needed (max 3 doses). Reported on 04/07/2015 (Patient not taking: Reported on 09/29/2016) 25 tablet 3   No current facility-administered medications on file prior to visit.     PAST MEDICAL HISTORY:   Past Medical History:  Diagnosis Date  . Allergy    SEASONAL  . Anemia    Anemia-NOS / PMH, Dr Julien Nordmann  . Arthritis   . Breast cancer ([place])    stage I right breast  . Cataract    BILATERAL  . Decreased vision    R eye  . Esophageal Stricture   . Hyperlipidemia   . Monoclonal gammopathy   . MVP (mitral valve prolapse)   . Osteoporosis   . Parkinson disease ([place])   . Skin cancer    basal cell L neck  . Vertical diplopia     PAST SURGICAL HISTORY:   Past Surgical History:  Procedure Laterality Date  . APPENDECTOMY  2002  . BONE BIOPSY  11/06/10  . BREAST LUMPECTOMY  02/2010   right breast lumpectomy and sentinel node biopsy  . CARDIAC CATHETERIZATION  2005   neg  . CATARACT EXTRACTION, BILATERAL  2018  . CATARACT EXTRACTION, BILATERAL Bilateral 07/2016  . COLONOSCOPY     negative     . HEMORRHOID SURGERY  1970's  . SALPINGOOPHORECTOMY  2002   for benign growths  . SHOULDER SURGERY Right   . TOTAL ABDOMINAL HYSTERECTOMY W/ BILATERAL SALPINGOOPHORECTOMY  76 yrs old   for pain,prolapse ; G3 P2  . UPPER GASTROINTESTINAL ENDOSCOPY      SOCIAL HISTORY:   Social History   Social History  . Marital status: Widowed    Spouse name: N/A  . Number of children: 2  . Years of education: N/A   Occupational History  . Retired    Social History Main Topics  . Smoking status: Never Smoker  .  Smokeless tobacco: Never Used  . Alcohol use No  . Drug use: No  . Sexual activity: Not on file   Other Topics Concern  . Not on file   Social History Narrative   Occupation: Textile   Widowed 2016, husband had cancer, was married in 1959    Patient has never smoked.    Alcohol Use - no   Illicit Drug Use - no   Patient does not get regular exercise.    Daily Caffeine Use: 3 cups of coffee daily    FAMILY HISTORY:   Family Status  Relation Status  . Mother Deceased       heart disease  . Brother Alive       diabetes  . Brother Deceased       heart disease, renal failure  . Father Deceased       hepatitis  . Sister Alive       diabetes  . Brother Deceased       drowned as a teenager  . Brother Alive       diabetes  . Brother Alive       diabetes  . Sister Alive       diabetes, liver disease (alcohol)  . Sister Alive       healthy  . Son Alive       sarcoidosis  . Daughter Alive       healthy  . Cousin Alive       Parkinsons disease  . Neg Hx (Not Specified)    ROS:  A complete 10 system review of systems was obtained and was unremarkable apart from what is mentioned above.  PHYSICAL EXAMINATION:    VITALS:   Vitals:   09/29/16 0823  BP: 90/64  Pulse: 88  SpO2: 98%  Weight: 126 lb (57.2 kg)  Height: 5\' 5"  (1.651 m)   No data found.   GEN:  The patient appears stated age and is in NAD.   HEENT:  Normocephalic.  She has ecchymosis on the L  cheek (covered with makeup).    The mucous membranes are moist. The superficial temporal arteries are without ropiness or tenderness. CV:  RRR Lungs:  CTAB Neck:  No bruits   Neurological examination:  Orientation: The patient is alert and oriented x3.  Cranial nerves: There is good facial symmetry. There is facial hypomimia. The speech is fluent and clear. Soft palate rises symmetrically and there is no tongue deviation. Hearing is intact to conversational tone. Sensation: Sensation is intact to light touch throughout Motor: Strength is at least antigravity x 4   Movement examination: Tone: There is normal tone in the upper extremity Abnormal movements: There is no tremor.  Mild dyskinesia in the LLE Coordination:  There is no decremation, with any form of RAMS, including alternating supination and pronation of the forearm, hand opening and closing, finger taps, heel taps and toe taps. Gait and Station: The patient has no trouble getting OOC.  Walks well with good arm swing.  ASSESSMENT/PLAN:  1.  Idiopathic Parkinson's disease.  She was dx in May, 2015.    -She looks better on carbidopa/levodopa 25/100, 1.5 tablets 3 times per day.  She will continue on this.  Risks, benefits, side effects and alternative therapies were discussed.  The opportunity to ask questions was given and they were answered to the best of my ability.  The patient expressed understanding and willingness to follow the outlined treatment protocols.  -needs to get back  to exercise and talked about ways to do this. 2.  Depression  -doing well on Effexor XR, 75 mg daily.  No longer with crying spells and feels good. 4.  Dysphagia  -MBE on 04/21/15 was normal  -[name] did not think this was GI issue. 5.  Orthostatic hypotension  -Drinking more water now and feeling less dizzy  -Talked to her about Northera but orthostatics today are much better than in the past and worry about creating supine HTN.  Wear compression  binder as much as possible, which she is doing.   6.  REM behavior disorder  -doing better with klonopin - 0.5 mg - 1/2 tablet at night.    -may be getting a little hangover with this.  Told her to try and take 30 min to 1 hour before bedtime to see if helps 7.  Follow up is anticipated in the next few months, sooner should new neurologic issues arise.  Much greater than 50% of this visit was spent in counseling and coordinating care.  Total face to face time:  25 min

## 2016-09-29 ENCOUNTER — Ambulatory Visit (INDEPENDENT_AMBULATORY_CARE_PROVIDER_SITE_OTHER): Payer: Medicare Other | Admitting: Neurology

## 2016-09-29 ENCOUNTER — Encounter: Payer: Self-pay | Admitting: Neurology

## 2016-09-29 VITALS — BP 90/64 | HR 88 | Ht 65.0 in | Wt 126.0 lb

## 2016-09-29 DIAGNOSIS — G20 Parkinson's disease: Secondary | ICD-10-CM

## 2016-09-29 DIAGNOSIS — F330 Major depressive disorder, recurrent, mild: Secondary | ICD-10-CM | POA: Diagnosis not present

## 2016-09-29 DIAGNOSIS — G4752 REM sleep behavior disorder: Secondary | ICD-10-CM | POA: Diagnosis not present

## 2016-09-29 NOTE — Patient Instructions (Signed)
Take klonopin 30 min to one hour before bedtime to see if it helps the morning sleepiness.  Call me when you need medication refills.

## 2016-10-06 ENCOUNTER — Ambulatory Visit (INDEPENDENT_AMBULATORY_CARE_PROVIDER_SITE_OTHER): Payer: Medicare Other

## 2016-10-06 DIAGNOSIS — Z23 Encounter for immunization: Secondary | ICD-10-CM

## 2016-10-13 ENCOUNTER — Other Ambulatory Visit (HOSPITAL_BASED_OUTPATIENT_CLINIC_OR_DEPARTMENT_OTHER): Payer: Medicare Other

## 2016-10-13 DIAGNOSIS — Z170 Estrogen receptor positive status [ER+]: Secondary | ICD-10-CM | POA: Diagnosis not present

## 2016-10-13 DIAGNOSIS — C50411 Malignant neoplasm of upper-outer quadrant of right female breast: Secondary | ICD-10-CM | POA: Diagnosis not present

## 2016-10-13 DIAGNOSIS — D472 Monoclonal gammopathy: Secondary | ICD-10-CM | POA: Diagnosis not present

## 2016-10-13 LAB — CBC WITH DIFFERENTIAL/PLATELET
BASO%: 0.7 % (ref 0.0–2.0)
Basophils Absolute: 0 10*3/uL (ref 0.0–0.1)
EOS%: 8 % — ABNORMAL HIGH (ref 0.0–7.0)
Eosinophils Absolute: 0.5 10*3/uL (ref 0.0–0.5)
HCT: 36.7 % (ref 34.8–46.6)
HGB: 12.3 g/dL (ref 11.6–15.9)
LYMPH%: 31.6 % (ref 14.0–49.7)
MCH: 33.1 pg (ref 25.1–34.0)
MCHC: 33.5 g/dL (ref 31.5–36.0)
MCV: 98.7 fL (ref 79.5–101.0)
MONO#: 0.3 10*3/uL (ref 0.1–0.9)
MONO%: 5.1 % (ref 0.0–14.0)
NEUT#: 3.6 10*3/uL (ref 1.5–6.5)
NEUT%: 54.6 % (ref 38.4–76.8)
Platelets: 226 10*3/uL (ref 145–400)
RBC: 3.72 10*6/uL (ref 3.70–5.45)
RDW: 14.5 % (ref 11.2–14.5)
WBC: 6.7 10*3/uL (ref 3.9–10.3)
lymph#: 2.1 10*3/uL (ref 0.9–3.3)

## 2016-10-13 LAB — COMPREHENSIVE METABOLIC PANEL
ALT: 17 U/L (ref 0–55)
AST: 25 U/L (ref 5–34)
Albumin: 3.4 g/dL — ABNORMAL LOW (ref 3.5–5.0)
Alkaline Phosphatase: 50 U/L (ref 40–150)
Anion Gap: 9 mEq/L (ref 3–11)
BUN: 20 mg/dL (ref 7.0–26.0)
CO2: 27 mEq/L (ref 22–29)
Calcium: 9.5 mg/dL (ref 8.4–10.4)
Chloride: 105 mEq/L (ref 98–109)
Creatinine: 0.8 mg/dL (ref 0.6–1.1)
EGFR: 68 mL/min/{1.73_m2} — ABNORMAL LOW (ref >90)
Glucose: 128 mg/dl (ref 70–140)
Potassium: 3.6 mEq/L (ref 3.5–5.1)
Sodium: 141 mEq/L (ref 136–145)
Total Bilirubin: 0.27 mg/dL (ref 0.20–1.20)
Total Protein: 7.2 g/dL (ref 6.4–8.3)

## 2016-10-14 LAB — BETA 2 MICROGLOBULIN, SERUM: Beta-2: 1.7 mg/L (ref 0.6–2.4)

## 2016-10-14 LAB — KAPPA/LAMBDA LIGHT CHAINS
Ig Kappa Free Light Chain: 56.4 mg/L — ABNORMAL HIGH (ref 3.3–19.4)
Ig Lambda Free Light Chain: 9.9 mg/L (ref 5.7–26.3)
Kappa/Lambda FluidC Ratio: 5.7 — ABNORMAL HIGH (ref 0.26–1.65)

## 2016-10-14 LAB — IGG, IGA, IGM
IgA, Qn, Serum: 36 mg/dL — ABNORMAL LOW (ref 64–422)
IgG, Qn, Serum: 1717 mg/dL — ABNORMAL HIGH (ref 700–1600)
IgM, Qn, Serum: 28 mg/dL (ref 26–217)

## 2016-10-14 LAB — CANCER ANTIGEN 27.29: CA 27.29: 18.5 U/mL (ref 0.0–38.6)

## 2016-10-18 ENCOUNTER — Ambulatory Visit (HOSPITAL_BASED_OUTPATIENT_CLINIC_OR_DEPARTMENT_OTHER): Payer: Medicare Other | Admitting: Internal Medicine

## 2016-10-18 ENCOUNTER — Telehealth: Payer: Self-pay | Admitting: Internal Medicine

## 2016-10-18 ENCOUNTER — Encounter: Payer: Self-pay | Admitting: Internal Medicine

## 2016-10-18 ENCOUNTER — Other Ambulatory Visit: Payer: Self-pay | Admitting: Neurology

## 2016-10-18 ENCOUNTER — Ambulatory Visit (HOSPITAL_BASED_OUTPATIENT_CLINIC_OR_DEPARTMENT_OTHER): Payer: Medicare Other

## 2016-10-18 VITALS — BP 129/65 | HR 87 | Temp 98.4°F | Resp 17 | Ht 65.0 in | Wt 130.7 lb

## 2016-10-18 DIAGNOSIS — C50011 Malignant neoplasm of nipple and areola, right female breast: Secondary | ICD-10-CM

## 2016-10-18 DIAGNOSIS — C50411 Malignant neoplasm of upper-outer quadrant of right female breast: Secondary | ICD-10-CM

## 2016-10-18 DIAGNOSIS — Z79811 Long term (current) use of aromatase inhibitors: Secondary | ICD-10-CM

## 2016-10-18 DIAGNOSIS — D472 Monoclonal gammopathy: Secondary | ICD-10-CM | POA: Diagnosis not present

## 2016-10-18 MED ORDER — DENOSUMAB 60 MG/ML ~~LOC~~ SOLN
60.0000 mg | Freq: Once | SUBCUTANEOUS | Status: AC
  Administered 2016-10-18: 60 mg via SUBCUTANEOUS
  Filled 2016-10-18: qty 1

## 2016-10-18 NOTE — Telephone Encounter (Signed)
Gave patient avs report and appointments for March 2019,

## 2016-10-18 NOTE — Progress Notes (Signed)
[facility] [phone]   [phone]  OFFICE PROGRESS NOTE  Tonia Ghent, MD [name] [address]  DIAGNOSIS:  1) Node-negative breast cancer status post completion of radiation 05/06/2010 currently on tamoxifen as well as prolia.  2) Monoclonal gammopathy of unknown significance on observation since 2007   PRIOR THERAPY:  1) status post right lumpectomy on 03/05/2010 and it showed invasive ductal carcinoma 0.9 CM, Positive for ER/PR and negative for HER-2, with ductal carcinoma in situ present and negative sentinel lymph node biopsies.  2) status post adjuvant radiotherapy completed in April of 2012   CURRENT THERAPY:  1) tamoxifen 20 mg by mouth daily started in 2012.  2) Prolia subcutaneous injection every 6 months.  INTERVAL HISTORY: [name] 76 y.o. female returns to the clinic today for six-month follow-up visit. The patient is feeling well today with no specific complaints. She denied having any chest pain, shortness of breath, cough or hemoptysis. She denied having any recent weight loss or night sweats. She has no nausea, vomiting, diarrhea or constipation. She continues to tolerate her treatment with tamoxifen fairly well. She is here today for evaluation and repeat blood work for evaluation of her disease.   MEDICAL HISTORY: Past Medical History:  Diagnosis Date  . Allergy    SEASONAL  . Anemia    Anemia-NOS / PMH, Dr Julien Nordmann  . Arthritis   . Breast cancer ([place])    stage I right breast  . Cataract    BILATERAL  . Decreased vision    R eye  . Esophageal Stricture   . Hyperlipidemia   . Monoclonal gammopathy   . MVP (mitral valve prolapse)   . Osteoporosis   . Parkinson disease ([place])   . Skin cancer    basal cell L neck  . Vertical diplopia     ALLERGIES:  is allergic to penicillins.  MEDICATIONS:  Current Outpatient Prescriptions  Medication Sig Dispense Refill  . aspirin 81  MG tablet Take 81 mg by mouth daily.      [name] b complex vitamins tablet Take 1 tablet by mouth daily.    . Calcium 1500 MG tablet Take 1,500 mg by mouth.      . carbidopa-levodopa (SINEMET IR) 25-100 MG tablet Take 1.5 tablets by mouth 3 (three) times daily. 405 tablet 1  . cholecalciferol (VITAMIN D) 1000 UNITS tablet Take 5,000 Units by mouth daily.      . clonazePAM (KLONOPIN) 0.5 MG tablet TAKE ONE-HALF TABLET BY MOUTH ONCE DAILY AT BEDTIME 45 tablet 1  . Denosumab (PROLIA [place]) Inject into the skin every 6 (six) months.    . Elastic Bandages & Supports (ABDOMINAL BINDER/ELASTIC SMALL) MISC 1 Device by Does not apply route daily. 1 each 0  . fluticasone (FLONASE) 50 MCG/ACT nasal spray Place 2 sprays into both nostrils daily. 16 g 1  . Multiple Vitamin (MULTIVITAMIN) capsule 2 tabs po qd     . nitroGLYCERIN (NITROSTAT) 0.3 MG SL tablet Place 1 tablet (0.3 mg total) under the tongue every 5 (five) minutes as needed (max 3 doses). Reported on 04/07/2015 (Patient not taking: Reported on 09/29/2016) 25 tablet 3  . Omega-3 Fatty Acids (OMEGA 3 PO) 1 tab po qd     . omeprazole (PRILOSEC OTC) 20 MG tablet Take 1 tablet (20 mg total) by mouth daily. 90 tablet 3  . tamoxifen (NOLVADEX) 20 MG tablet TAKE 1 TABLET BY MOUTH  ONCE DAILY 30 tablet 2  . traMADol (ULTRAM) 50 MG tablet TAKE ONE TO TWO TABLETS BY MOUTH AT BEDTIME AS NEEDED FOR PAIN 60 tablet 5  . venlafaxine XR (EFFEXOR-XR) 75 MG 24 hr capsule TAKE ONE CAPSULE BY MOUTH ONCE DAILY WITH  BREAKFAST 30 capsule 5  . vitamin B-12 (CYANOCOBALAMIN) 500 MCG tablet Take 2500 mcg daily.     No current facility-administered medications for this visit.     SURGICAL HISTORY:  Past Surgical History:  Procedure Laterality Date  . APPENDECTOMY  2002  . BONE BIOPSY  11/06/10  . BREAST LUMPECTOMY  02/2010   right breast lumpectomy and sentinel node biopsy  . CARDIAC CATHETERIZATION  2005   neg  . CATARACT EXTRACTION, BILATERAL  2018  . CATARACT EXTRACTION,  BILATERAL Bilateral 07/2016  . COLONOSCOPY     negative   . HEMORRHOID SURGERY  1970's  . SALPINGOOPHORECTOMY  2002   for benign growths  . SHOULDER SURGERY Right   . TOTAL ABDOMINAL HYSTERECTOMY W/ BILATERAL SALPINGOOPHORECTOMY  76 yrs old   for pain,prolapse ; G3 P2  . UPPER GASTROINTESTINAL ENDOSCOPY      REVIEW OF SYSTEMS:  A comprehensive review of systems was negative.   PHYSICAL EXAMINATION: General appearance: alert, cooperative and no distress Head: Normocephalic, without obvious abnormality, atraumatic Neck: no adenopathy, no JVD, supple, symmetrical, trachea midline and thyroid not enlarged, symmetric, no tenderness/mass/nodules Lymph nodes: Cervical, supraclavicular, and axillary nodes normal. Resp: clear to auscultation bilaterally Back: symmetric, no curvature. ROM normal. No CVA tenderness. Cardio: regular rate and rhythm, S1, S2 normal, no murmur, click, rub or gallop GI: soft, non-tender; bowel sounds normal; no masses,  no organomegaly Extremities: extremities normal, atraumatic, no cyanosis or edema   ECOG PERFORMANCE STATUS: 0 - Asymptomatic  Blood pressure 129/65, pulse 87, temperature 98.4 F (36.9 C), temperature source Oral, resp. rate 17, height 5' 5"  (1.651 m), weight 130 lb 11.2 oz (59.3 kg), SpO2 98 %.  LABORATORY DATA: Lab Results  Component Value Date   WBC 6.7 10/13/2016   HGB 12.3 10/13/2016   HCT 36.7 10/13/2016   MCV 98.7 10/13/2016   PLT 226 10/13/2016      Chemistry      Component Value Date/Time   NA 141 10/13/2016 1141   K 3.6 10/13/2016 1141   CL 105 10/10/2015 1132   CL 107 03/21/2012 1501   CO2 27 10/13/2016 1141   BUN 20.0 10/13/2016 1141   CREATININE 0.8 10/13/2016 1141      Component Value Date/Time   CALCIUM 9.5 10/13/2016 1141   ALKPHOS 50 10/13/2016 1141   AST 25 10/13/2016 1141   ALT 17 10/13/2016 1141   BILITOT 0.27 10/13/2016 1141       RADIOGRAPHIC STUDIES: No results found.  ASSESSMENT AND PLAN:  This  is a very pleasant 76 years old white female with history of breast adenocarcinoma currently on tamoxifen as well as history of monoclonal gammopathy of undetermined significance currently on observation. The patient has no complaints today. Her myeloma panel and CA 27.29 are stable. I recommended for her to continue on observation for the multiple myeloma in addition to tamoxifen for her history of breast cancer. She will receive Prolia injection today. The patient will come back for follow-up visit in 6 months for reevaluation with repeat lab work. She was advised to call immediately she has any concerning symptoms in the interval. The patient voices understanding of current disease status and treatment options and is in  agreement with the current care plan.  All questions were answered. The patient knows to call the clinic with any problems, questions or concerns. We can certainly see the patient much sooner if necessary. I spent 10 minutes counseling the patient face to face. The total time spent in the appointment was 15 minutes.  Disclaimer: This note was dictated with voice recognition software. Similar sounding words can inadvertently be transcribed and may not be corrected upon review.

## 2016-10-29 DIAGNOSIS — H02413 Mechanical ptosis of bilateral eyelids: Secondary | ICD-10-CM | POA: Diagnosis not present

## 2016-10-29 DIAGNOSIS — H02411 Mechanical ptosis of right eyelid: Secondary | ICD-10-CM | POA: Diagnosis not present

## 2016-10-29 DIAGNOSIS — H02412 Mechanical ptosis of left eyelid: Secondary | ICD-10-CM | POA: Diagnosis not present

## 2016-11-23 ENCOUNTER — Ambulatory Visit: Payer: Medicare Other | Admitting: Cardiovascular Disease

## 2016-11-26 ENCOUNTER — Other Ambulatory Visit: Payer: Self-pay | Admitting: Neurology

## 2016-12-29 ENCOUNTER — Other Ambulatory Visit: Payer: Self-pay | Admitting: Family Medicine

## 2016-12-29 ENCOUNTER — Other Ambulatory Visit: Payer: Self-pay | Admitting: Neurology

## 2017-01-04 ENCOUNTER — Ambulatory Visit: Payer: Medicare Other

## 2017-01-12 ENCOUNTER — Telehealth: Payer: Self-pay

## 2017-01-12 DIAGNOSIS — M810 Age-related osteoporosis without current pathological fracture: Secondary | ICD-10-CM

## 2017-01-12 DIAGNOSIS — R7309 Other abnormal glucose: Secondary | ICD-10-CM

## 2017-01-12 DIAGNOSIS — E782 Mixed hyperlipidemia: Secondary | ICD-10-CM

## 2017-01-12 DIAGNOSIS — E049 Nontoxic goiter, unspecified: Secondary | ICD-10-CM

## 2017-01-12 NOTE — Telephone Encounter (Signed)
CPE labs ordered. TSH, Vit D, FLP, A1C  CBC - 10/13/16 CMP - 10/13/16  Dr. [name], please review lab orders, make modifications as necessary, and sign note. Thank you.

## 2017-01-13 ENCOUNTER — Ambulatory Visit (INDEPENDENT_AMBULATORY_CARE_PROVIDER_SITE_OTHER): Payer: Medicare Other

## 2017-01-13 VITALS — BP 122/70 | HR 80 | Temp 99.2°F | Ht 63.0 in | Wt 126.8 lb

## 2017-01-13 DIAGNOSIS — Z0000 Encounter for general adult medical examination without abnormal findings: Secondary | ICD-10-CM | POA: Diagnosis not present

## 2017-01-13 DIAGNOSIS — E049 Nontoxic goiter, unspecified: Secondary | ICD-10-CM | POA: Diagnosis not present

## 2017-01-13 DIAGNOSIS — R7309 Other abnormal glucose: Secondary | ICD-10-CM

## 2017-01-13 DIAGNOSIS — E782 Mixed hyperlipidemia: Secondary | ICD-10-CM | POA: Diagnosis not present

## 2017-01-13 DIAGNOSIS — M810 Age-related osteoporosis without current pathological fracture: Secondary | ICD-10-CM

## 2017-01-13 LAB — TSH: TSH: 3.93 u[IU]/mL (ref 0.35–4.50)

## 2017-01-13 LAB — HEMOGLOBIN A1C: Hgb A1c MFr Bld: 5.8 % (ref 4.6–6.5)

## 2017-01-13 LAB — VITAMIN D 25 HYDROXY (VIT D DEFICIENCY, FRACTURES): VITD: 29.06 ng/mL — ABNORMAL LOW (ref 30.00–100.00)

## 2017-01-13 LAB — LIPID PANEL
Cholesterol: 159 mg/dL (ref 0–200)
HDL: 71.5 mg/dL (ref >39.00)
LDL Cholesterol: 69 mg/dL (ref 0–99)
NonHDL: 87.27
Total CHOL/HDL Ratio: 2
Triglycerides: 89 mg/dL (ref 0.0–149.0)
VLDL: 17.8 mg/dL (ref 0.0–40.0)

## 2017-01-13 NOTE — Progress Notes (Signed)
PCP notes:   Health maintenance:  No gaps identified.   Abnormal screenings:   Fall risk - hx of multiple falls with and without injury; unable to recall how many falls in the past 12 mths  Hearing - failed  Hearing Screening   125Hz  250Hz  500Hz  1000Hz  2000Hz  3000Hz  4000Hz  6000Hz  8000Hz   Right ear:   40 0 40  0    Left ear:   0 0 40  0     Patient concerns:   None  Nurse concerns:  None  Next PCP appt:   01/21/17 @ 1600  .I reviewed health advisor's note, was available for consultation on the day of service listed in this note, and agree with documentation and plan. [name], MD.

## 2017-01-13 NOTE — Patient Instructions (Signed)
[name] , Thank you for taking time to come for your Medicare Wellness Visit. I appreciate your ongoing commitment to your health goals. Please review the following plan we discussed and let me know if I can assist you in the future.   These are the goals we discussed: Goals    . Increase physical activity     Starting 01/13/2017, I will continue to exercise for 60-90 minutes 6 days per week.        This is a list of the screening recommended for you and due dates:  Health Maintenance  Topic Date Due  . Tetanus Vaccine  03/04/2019  . Colon Cancer Screening  11/04/2019  . Flu Shot  Completed  . DEXA scan (bone density measurement)  Completed  . Pneumonia vaccines  Completed   Preventive Care for Adults  A healthy lifestyle and preventive care can promote health and wellness. Preventive health guidelines for adults include the following key practices.  . A routine yearly physical is a good way to check with your health care provider about your health and preventive screening. It is a chance to share any concerns and updates on your health and to receive a thorough exam.  . Visit your dentist for a routine exam and preventive care every 6 months. Brush your teeth twice a day and floss once a day. Good oral hygiene prevents tooth decay and gum disease.  . The frequency of eye exams is based on your age, health, family medical history, use  of contact lenses, and other factors. Follow your health care provider's recommendations for frequency of eye exams.  . Eat a healthy diet. Foods like vegetables, fruits, whole grains, low-fat dairy products, and lean protein foods contain the nutrients you need without too many calories. Decrease your intake of foods high in solid fats, added sugars, and salt. Eat the right amount of calories for you. Get information about a proper diet from your health care provider, if necessary.  . Regular physical exercise is one of the most important things  you can do for your health. Most adults should get at least 150 minutes of moderate-intensity exercise (any activity that increases your heart rate and causes you to sweat) each week. In addition, most adults need muscle-strengthening exercises on 2 or more days a week.  Silver Sneakers may be a benefit available to you. To determine eligibility, you may visit the website: www.silversneakers.com or contact program at [phone] Mon-Fri between 8AM-8PM.   . Maintain a healthy weight. The body mass index (BMI) is a screening tool to identify possible weight problems. It provides an estimate of body fat based on height and weight. Your health care provider can find your BMI and can help you achieve or maintain a healthy weight.   For adults 20 years and older: ? A BMI below 18.5 is considered underweight. ? A BMI of 18.5 to 24.9 is normal. ? A BMI of 25 to 29.9 is considered overweight. ? A BMI of 30 and above is considered obese.   . Maintain normal blood lipids and cholesterol levels by exercising and minimizing your intake of saturated fat. Eat a balanced diet with plenty of fruit and vegetables. Blood tests for lipids and cholesterol should begin at age 65 and be repeated every 5 years. If your lipid or cholesterol levels are high, you are over 50, or you are at high risk for heart disease, you may need your cholesterol levels checked more frequently. Ongoing high lipid  and cholesterol levels should be treated with medicines if diet and exercise are not working.  . If you smoke, find out from your health care provider how to quit. If you do not use tobacco, please do not start.  . If you choose to drink alcohol, please do not consume more than 2 drinks per day. One drink is considered to be 12 ounces (355 mL) of beer, 5 ounces (148 mL) of wine, or 1.5 ounces (44 mL) of liquor.  . If you are 62-24 years old, ask your health care provider if you should take aspirin to prevent strokes.  . Use  sunscreen. Apply sunscreen liberally and repeatedly throughout the day. You should seek shade when your shadow is shorter than you. Protect yourself by wearing long sleeves, pants, a wide-brimmed hat, and sunglasses year round, whenever you are outdoors.  . Once a month, do a whole body skin exam, using a mirror to look at the skin on your back. Tell your health care provider of new moles, moles that have irregular borders, moles that are larger than a pencil eraser, or moles that have changed in shape or color.

## 2017-01-13 NOTE — Progress Notes (Signed)
Subjective:   [name] is a 76 y.o. female who presents for Medicare Annual (Subsequent) preventive examination.  Review of Systems:  N/A Cardiac Risk Factors include: advanced age (>2men, >14 women);dyslipidemia     Objective:     Vitals: BP 122/70 (BP Location: Left Arm, Patient Position: Sitting, Cuff Size: Normal)   Pulse 80   Temp 99.2 F (37.3 C) (Oral)   Ht 5\' 3"  (1.6 m) Comment: no shoes  Wt 126 lb 12 oz (57.5 kg)   SpO2 99%   BMI 22.45 kg/m   Body mass index is 22.45 kg/m.  Advanced Directives 01/13/2017 10/18/2016 04/19/2016 04/12/2016 04/12/2016 03/10/2016 10/13/2015  Does Patient Have a Medical Advance Directive? Yes Yes Yes Yes No Yes No  Type of Paramedic of [place];Living will [place];Living will [place];Living will [place];Living will - [place];Living will -  Does patient want to make changes to medical advance directive? Yes (MAU/Ambulatory/Procedural Areas - Information given) - - - - - -  Copy of [place] in Chart? No - copy requested Yes No - copy requested No - copy requested - - -  Would patient like information on creating a medical advance directive? - - - - - - No - patient declined information    Tobacco Social History   Tobacco Use  Smoking Status Never Smoker  Smokeless Tobacco Never Used     Counseling given: No   Clinical Intake:  Pre-visit preparation completed: Yes  Pain : No/denies pain Pain Score: 0-No pain     Nutritional Status: BMI of 19-24  Normal Nutritional Risks: None Diabetes: No  How often do you need to have someone help you when you read instructions, pamphlets, or other written materials from your doctor or pharmacy?: 1 - Never What is the last grade level you completed in school?: Associate degree  Interpreter Needed?: No  Comments: pt is a widow and lives alone Information  entered by :: LPinson,LPN  Past Medical History:  Diagnosis Date  . Allergy    SEASONAL  . Anemia    Anemia-NOS / PMH, Dr Julien Nordmann  . Arthritis   . Breast cancer ([place])    stage I right breast  . Cataract    BILATERAL  . Decreased vision    R eye  . Esophageal Stricture   . Hyperlipidemia   . Monoclonal gammopathy   . MVP (mitral valve prolapse)   . Osteoporosis   . Parkinson disease ([place])   . Skin cancer    basal cell L neck  . Vertical diplopia    Past Surgical History:  Procedure Laterality Date  . APPENDECTOMY  2002  . BONE BIOPSY  11/06/10  . BREAST LUMPECTOMY  02/2010   right breast lumpectomy and sentinel node biopsy  . CARDIAC CATHETERIZATION  2005   neg  . CATARACT EXTRACTION, BILATERAL  2018  . CATARACT EXTRACTION, BILATERAL Bilateral 07/2016  . COLONOSCOPY     negative   . HEMORRHOID SURGERY  1970's  . SALPINGOOPHORECTOMY  2002   for benign growths  . SHOULDER SURGERY Right   . TOTAL ABDOMINAL HYSTERECTOMY W/ BILATERAL SALPINGOOPHORECTOMY  76 yrs old   for pain,prolapse ; G3 P2  . UPPER GASTROINTESTINAL ENDOSCOPY     Family History  Problem Relation Age of Onset  . Diabetes Mother   . Heart disease Mother   . Diabetes Brother   . Heart disease  Brother   . Hepatitis Father   . Diabetes Brother   . Diabetes Brother   . Breast cancer Neg Hx   . Colon cancer Neg Hx    Social History   Socioeconomic History  . Marital status: Widowed    Spouse name: None  . Number of children: 2  . Years of education: None  . Highest education level: None  Social Needs  . Financial resource strain: None  . Food insecurity - worry: None  . Food insecurity - inability: None  . Transportation needs - medical: None  . Transportation needs - non-medical: None  Occupational History  . Occupation: Retired  Tobacco Use  . Smoking status: Never Smoker  . Smokeless tobacco: Never Used  Substance and Sexual Activity  . Alcohol use: None  . Drug use: No  . Sexual  activity: None  Other Topics Concern  . None  Social History Narrative   Occupation: Textile   Widowed 2016, husband had cancer, was married in 1959    Patient has never smoked.    Alcohol Use - no   Illicit Drug Use - no   Patient does not get regular exercise.    Daily Caffeine Use: 3 cups of coffee daily    Outpatient Encounter Medications as of 01/13/2017  Medication Sig  . aspirin 81 MG tablet Take 81 mg by mouth daily.    [name] b complex vitamins tablet Take 1 tablet by mouth daily.  . Calcium 1500 MG tablet Take 1,500 mg by mouth.    . carbidopa-levodopa (SINEMET IR) 25-100 MG tablet TAKE 1 & 1/2 (ONE & ONE-HALF) TABLETS BY MOUTH THREE TIMES DAILY  . cholecalciferol (VITAMIN D) 1000 UNITS tablet Take 5,000 Units by mouth daily.    . clonazePAM (KLONOPIN) 0.5 MG tablet TAKE ONE-HALF TABLET BY MOUTH ONCE DAILY AT BEDTIME  . Denosumab (PROLIA [place]) Inject into the skin every 6 (six) months.  . Elastic Bandages & Supports (ABDOMINAL BINDER/ELASTIC SMALL) MISC 1 Device by Does not apply route daily.  . fluticasone (FLONASE) 50 MCG/ACT nasal spray Place 2 sprays into both nostrils daily.  . Multiple Vitamin (MULTIVITAMIN) capsule 2 tabs po qd   . nitroGLYCERIN (NITROSTAT) 0.3 MG SL tablet Place 1 tablet (0.3 mg total) under the tongue every 5 (five) minutes as needed (max 3 doses). Reported on 04/07/2015  . Omega-3 Fatty Acids (OMEGA 3 PO) 1 tab po qd   . omeprazole (PRILOSEC) 20 MG capsule TAKE ONE CAPSULE BY MOUTH ONCE DAILY  . tamoxifen (NOLVADEX) 20 MG tablet TAKE 1 TABLET BY MOUTH ONCE DAILY  . traMADol (ULTRAM) 50 MG tablet TAKE ONE TO TWO TABLETS BY MOUTH AT BEDTIME AS NEEDED FOR PAIN  . venlafaxine XR (EFFEXOR-XR) 75 MG 24 hr capsule TAKE 1 CAPSULE BY MOUTH ONCE DAILY WITH  BREAKFAST  . vitamin B-12 (CYANOCOBALAMIN) 500 MCG tablet Take 2500 mcg daily.   No facility-administered encounter medications on file as of 01/13/2017.     Activities of Daily Living In your present state  of health, do you have any difficulty performing the following activities: 01/13/2017  Hearing? N  Vision? N  Difficulty concentrating or making decisions? N  Walking or climbing stairs? N  Dressing or bathing? N  Doing errands, shopping? N  Preparing Food and eating ? N  Using the Toilet? N  In the past six months, have you accidently leaked urine? Y  Do you have problems with loss of bowel control? Y  Managing your  Medications? N  Managing your Finances? N  Housekeeping or managing your Housekeeping? N  Some recent data might be hidden    Patient Care Team: Tonia Ghent, MD as PCP - General (Family Medicine)    Assessment:   This is a routine wellness examination for [name].   Hearing Screening   125Hz  250Hz  500Hz  1000Hz  2000Hz  3000Hz  4000Hz  6000Hz  8000Hz   Right ear:   40 0 40  0    Left ear:   0 0 40  0    Vision Screening Comments: Last vision exam in 2018 with Dr. Manuella Ghazi @ [facility] and Dietary recommendations Current Exercise Habits: Home exercise routine, Type of exercise: walking;treadmill;strength training/weights, Time (Minutes): 60(60-90 minutes), Frequency (Times/Week): 6, Weekly Exercise (Minutes/Week): 360, Intensity: Moderate, Exercise limited by: None identified  Goals    . Increase physical activity     Starting 01/13/2017, I will continue to exercise for 60-90 minutes 6 days per week.        Fall Risk Fall Risk  01/13/2017 09/29/2016 05/21/2016 02/18/2016 11/18/2015  Falls in the past year? Yes Yes Yes Yes Yes  Number falls in past yr: 2 or more 2 or more 2 or more 2 or more 1  Injury with Fall? Yes No No No No  Risk Factor Category  High Fall Risk - High Fall Risk High Fall Risk -  Risk for fall due to : History of fall(s);Impaired balance/gait - - - -  Follow up - Falls evaluation completed Falls evaluation completed Falls evaluation completed Falls evaluation completed  Depression Screen PHQ 2/9 Scores 01/13/2017  10/17/2015 01/30/2015 07/18/2014  PHQ - 2 Score 0 0 0 0  PHQ- 9 Score 0 - - -     Cognitive Function MMSE - Mini Mental State Exam 01/13/2017  Orientation to time 5  Orientation to Place 5  Registration 3  Attention/ Calculation 0  Recall 3  Language- name 2 objects 0  Language- repeat 1  Language- follow 3 step command 3  Language- read & follow direction 0  Write a sentence 0  Copy design 0  Total score 20     PLEASE NOTE: A Mini-Cog screen was completed. Maximum score is 20. A value of 0 denotes this part of [place] MMSE was not completed or the patient failed this part of the Mini-Cog screening.   Mini-Cog Screening Orientation to Time - Max 5 pts Orientation to Place - Max 5 pts Registration - Max 3 pts Recall - Max 3 pts Language Repeat - Max 1 pts Language Follow 3 Step Command - Max 3 pts     Immunization History  Administered Date(s) Administered  . Influenza Split 01/06/2011, 12/01/2011  . Influenza Whole 02/21/2009  . Influenza,inj,Quad PF,6+ Mos 10/31/2012, 10/08/2013, 10/08/2014, 09/26/2015, 10/06/2016  . Pneumococcal Conjugate-13 07/18/2014  . Pneumococcal Polysaccharide-23 03/04/2011  . Zoster 10/26/2012    Screening Tests Health Maintenance  Topic Date Due  . TETANUS/TDAP  03/04/2019  . COLONOSCOPY  11/04/2019  . INFLUENZA VACCINE  Completed  . DEXA SCAN  Completed  . PNA vac Low Risk Adult  Completed      Plan:     I have personally reviewed, addressed, and noted the following in the patient's chart:  A. Medical and social history B. Use of alcohol, tobacco or illicit drugs  C. Current medications and supplements D. Functional ability and status E.  Nutritional status F.  Physical activity G. Advance directives H. List  of other physicians I.  Hospitalizations, surgeries, and ER visits in previous 12 months J.  [place] to include hearing, vision, cognitive, depression L. Referrals and appointments - none  In addition, I  have reviewed and discussed with patient certain preventive protocols, quality metrics, and best practice recommendations. A written personalized care plan for preventive services as well as general preventive health recommendations were provided to patient.  See attached scanned questionnaire for additional information.   Signed,   Lindell Noe, MHA, BS, LPN Health Coach

## 2017-01-13 NOTE — Progress Notes (Signed)
Pre visit review using our clinic review tool, if applicable. No additional management support is needed unless otherwise documented below in the visit note. 

## 2017-01-19 ENCOUNTER — Ambulatory Visit: Payer: Medicare Other

## 2017-01-19 ENCOUNTER — Ambulatory Visit: Payer: Medicare Other | Admitting: Occupational Therapy

## 2017-01-19 ENCOUNTER — Ambulatory Visit: Payer: Medicare Other | Admitting: Physical Therapy

## 2017-01-21 ENCOUNTER — Encounter: Payer: Self-pay | Admitting: Family Medicine

## 2017-01-21 ENCOUNTER — Ambulatory Visit (INDEPENDENT_AMBULATORY_CARE_PROVIDER_SITE_OTHER): Payer: Medicare Other | Admitting: Family Medicine

## 2017-01-21 VITALS — BP 122/70 | HR 80 | Temp 99.2°F | Ht 63.0 in | Wt 126.8 lb

## 2017-01-21 DIAGNOSIS — K219 Gastro-esophageal reflux disease without esophagitis: Secondary | ICD-10-CM | POA: Diagnosis not present

## 2017-01-21 DIAGNOSIS — M810 Age-related osteoporosis without current pathological fracture: Secondary | ICD-10-CM | POA: Diagnosis not present

## 2017-01-21 DIAGNOSIS — G20 Parkinson's disease: Secondary | ICD-10-CM | POA: Diagnosis not present

## 2017-01-21 DIAGNOSIS — Z7189 Other specified counseling: Secondary | ICD-10-CM

## 2017-01-21 DIAGNOSIS — M542 Cervicalgia: Secondary | ICD-10-CM | POA: Diagnosis not present

## 2017-01-21 DIAGNOSIS — G20A1 Parkinson's disease without dyskinesia, without mention of fluctuations: Secondary | ICD-10-CM

## 2017-01-21 MED ORDER — OMEPRAZOLE 20 MG PO CPDR: 20.0000 mg | DELAYED_RELEASE_CAPSULE | Freq: Every day | ORAL | 3 refills | Status: DC

## 2017-01-21 MED ORDER — TRAMADOL HCL 50 MG PO TABS: ORAL_TABLET | ORAL | 5 refills | Status: DC

## 2017-01-21 NOTE — Progress Notes (Signed)
Fall risk - hx of multiple falls with and without injury; unable to recall how many falls in the past 12 months.  D/w pt about cautions.  PD likely contributes.  She can overcompensate and lose her balance.  No syncope.  D/w pt about balance exercises, for home program.  She agrees to restart.    Hearing - failed.  Declined hearing aids.    PD.  Seeing Dr. Carles Collet.  See above regarding falls.  Compliant with medication.  No adverse effect of medication.  Osteoporosis on prolia.  Vit D slightly low, but she had missed a few doses of vitamin D recently.  Per Dr. Julien Nordmann.  I'll defer about DXA.   Patient agrees.    Son Rolan Bucco designated if patient were incapacitated.   Tramadol helps neck pain.  No adverse effect of medication.  Decreased pain with med use.  Long-standing.  No change in symptoms recently.  No weakness. (Tramadol phoned to pharmacy as directed by Dr. [name].  Mike Craze, CMA)  GERD controlled with PPI.  No ADE.  Compliant.  No abdominal pain.  PMH and SH reviewed  ROS: Per HPI unless specifically indicated in ROS section   Meds, vitals, and allergies reviewed.   GEN: nad, alert and oriented, affect is slightly flat but she is able to smile in conversation. HEENT: mucous membranes moist NECK: supple w/o LA CV: rrr.  PULM: ctab, no inc wob ABD: soft, +bs EXT: no edema SKIN: no acute rash Minimal tremor noted in the B hands.

## 2017-01-21 NOTE — Progress Notes (Signed)
Cardiology Office Note   Date:  01/21/2017   ID:  Megan, [name] [birth year], MRN [id number]  PCP:  Tonia Ghent, MD  Cardiologist:   Jenkins Rouge, MD   No chief complaint on file.     History of Present Illness: [name] is a 76 y.o. female who presents for f/u chest pain. Last seen by me August 2017 first seen  2012 after right mastectomy and Dx of breast cancer and myeloma. Had atypical chest pain during XRT. Echos showed normal EF and no MVP trivial MR  despite patients reported history of such. Seen by Dr Tat 09/09/15 for movement disorder and tremor on carbidopa/levodopa and Effexor for depression.   Echo reviewed 09/23/15 normal EF trace MR no MVP Myovue 09/16/15 normal no ischemia EF 67%  Fell and hurt her right shoulder has had rotator cuff surgery on it by Dr [name] a year ago Some peripheral neuropathy in legs    Past Medical History:  Diagnosis Date  . Allergy    SEASONAL  . Anemia    Anemia-NOS / PMH, Dr Julien Nordmann  . Arthritis   . Breast cancer ([place])    stage I right breast  . Cataract    BILATERAL  . Decreased vision    R eye  . Esophageal Stricture   . Hyperlipidemia   . Monoclonal gammopathy   . MVP (mitral valve prolapse)   . Osteoporosis   . Parkinson disease ([name])   . Skin cancer    basal cell L neck  . Vertical diplopia     Past Surgical History:  Procedure Laterality Date  . APPENDECTOMY  2002  . BONE BIOPSY  11/06/10  . BREAST LUMPECTOMY  02/2010   right breast lumpectomy and sentinel node biopsy  . CARDIAC CATHETERIZATION  2005   neg  . CATARACT EXTRACTION, BILATERAL  2018  . CATARACT EXTRACTION, BILATERAL Bilateral 07/2016  . COLONOSCOPY     negative   . HEMORRHOID SURGERY  1970's  . SALPINGOOPHORECTOMY  2002   for benign growths  . SHOULDER SURGERY Right   . TOTAL ABDOMINAL HYSTERECTOMY W/ BILATERAL SALPINGOOPHORECTOMY  76 yrs old   for pain,prolapse ; G3 P2  . UPPER GASTROINTESTINAL ENDOSCOPY        Current Outpatient Medications  Medication Sig Dispense Refill  . aspirin 81 MG tablet Take 81 mg by mouth daily.      [name] b complex vitamins tablet Take 1 tablet by mouth daily.    . Calcium 1500 MG tablet Take 1,500 mg by mouth.      . carbidopa-levodopa (SINEMET IR) 25-100 MG tablet TAKE 1 & 1/2 (ONE & ONE-HALF) TABLETS BY MOUTH THREE TIMES DAILY 405 tablet 1  . cholecalciferol (VITAMIN D) 1000 UNITS tablet Take 5,000 Units by mouth daily.      . clonazePAM (KLONOPIN) 0.5 MG tablet TAKE ONE-HALF TABLET BY MOUTH ONCE DAILY AT BEDTIME 45 tablet 1  . Denosumab (PROLIA [place]) Inject into the skin every 6 (six) months.    . Elastic Bandages & Supports (ABDOMINAL BINDER/ELASTIC SMALL) MISC 1 Device by Does not apply route daily. 1 each 0  . fluticasone (FLONASE) 50 MCG/ACT nasal spray Place 2 sprays into both nostrils daily. 16 g 1  . Multiple Vitamin (MULTIVITAMIN) capsule 2 tabs po qd     . nitroGLYCERIN (NITROSTAT) 0.3 MG SL tablet Place 1 tablet (0.3 mg total) under the tongue every 5 (five) minutes as needed (max 3 doses). Reported  on 04/07/2015 25 tablet 3  . Omega-3 Fatty Acids (OMEGA 3 PO) 1 tab po qd     . omeprazole (PRILOSEC) 20 MG capsule TAKE ONE CAPSULE BY MOUTH ONCE DAILY 90 capsule 0  . tamoxifen (NOLVADEX) 20 MG tablet TAKE 1 TABLET BY MOUTH ONCE DAILY 30 tablet 2  . traMADol (ULTRAM) 50 MG tablet TAKE ONE TO TWO TABLETS BY MOUTH AT BEDTIME AS NEEDED FOR PAIN 60 tablet 5  . venlafaxine XR (EFFEXOR-XR) 75 MG 24 hr capsule TAKE 1 CAPSULE BY MOUTH ONCE DAILY WITH  BREAKFAST 30 capsule 5  . vitamin B-12 (CYANOCOBALAMIN) 500 MCG tablet Take 2500 mcg daily.     No current facility-administered medications for this visit.     Allergies:   Penicillins    Social History:  The patient  reports that  has never smoked. she has never used smokeless tobacco. She reports that she does not use drugs.   Family History:  The patient's family history includes Diabetes in her brother,  brother, brother, and mother; Heart disease in her brother and mother; Hepatitis in her father.    ROS:  Please see the history of present illness.   Otherwise, review of systems are positive for none.   All other systems are reviewed and negative.    PHYSICAL EXAM: VS:  There were no vitals taken for this visit. , BMI There is no height or weight on file to calculate BMI. Chronically ill thin female Affect appropriate HEENT: normal Neck supple with no adenopathy JVP normal no bruits no thyromegaly Lungs clear with no wheezing and good diaphragmatic motion Heart:  S1/S2 no murmur, no rub, gallop or click PMI normal Abdomen: benighn, BS positve, no tenderness, no AAA no bruit.  No HSM or HJR Distal pulses intact with no bruits No edema Neuro non-focal LUE tremor  Skin warm and dry No muscular weakness     EKG:  03/03/10 SR rate 75 short PR nonspecific ST/T wave changes 01/31/17 SR rate 89 normal PR 110 msec    Recent Labs: 10/13/2016: ALT 17; BUN 20.0; Creatinine 0.8; HGB 12.3; Platelets 226; Potassium 3.6; Sodium 141 01/13/2017: TSH 3.93    Lipid Panel    Component Value Date/Time   CHOL 159 01/13/2017 1155   TRIG 89.0 01/13/2017 1155   HDL 71.50 01/13/2017 1155   CHOLHDL 2 01/13/2017 1155   VLDL 17.8 01/13/2017 1155   LDLCALC 69 01/13/2017 1155      Wt Readings from Last 3 Encounters:  01/13/17 126 lb 12 oz (57.5 kg)  10/18/16 130 lb 11.2 oz (59.3 kg)  09/29/16 126 lb (57.2 kg)      Other studies Reviewed: Additional studies/ records that were reviewed today include: notes Dr Tat Cardiology records 2012 .Myovue 09/16/15 Echo 09/23/15     ASSESSMENT AND PLAN:  1.  Chest pain atypical normal myovue EF 67% 09/16/15 observe  2. MVP by history no murmur echo 09/23/15 trace MR no MVP no need for SBE  3. Parkinson's f/u Tat  Ok to use Northera for postural hypotension in future  4. Ortho: f/u Supple for right shoulder pain post fall with previous rotator cuff surgery  may need PT/OT  Current medicines are reviewed at length with the patient today.  The patient does not have concerns regarding medicines.  The following changes have been made:  no change  Labs/ tests ordered today include:    No orders of the defined types were placed in this encounter.  Disposition:   FU with me in a year      Signed, Jenkins Rouge, MD  01/21/2017 2:16 PM    [facility] [address], [place], [place]  [address] Phone: [phone]; Fax: [phone]

## 2017-01-21 NOTE — Patient Instructions (Signed)
Don't change your meds for now but restart the home exercises for balance.  Take care.  Glad to see you.

## 2017-01-23 DIAGNOSIS — Z7189 Other specified counseling: Secondary | ICD-10-CM | POA: Insufficient documentation

## 2017-01-23 MED ORDER — CHOLECALCIFEROL 125 MCG (5000 UT) PO TABS: 5000.0000 [IU] | ORAL_TABLET | Freq: Every day | ORAL | Status: DC

## 2017-01-23 NOTE — Assessment & Plan Note (Signed)
Tramadol helps neck pain.  No adverse effect of medication.  Decreased pain with med use.  Long-standing.  No change in symptoms recently.  No weakness. (Tramadol phoned to pharmacy as directed by Dr. [name].  Mike Craze, CMA).  Would continue as is.  Update me as needed.  She agrees.

## 2017-01-23 NOTE — Assessment & Plan Note (Signed)
Son [name] designated if patient were incapacitated.

## 2017-01-23 NOTE — Assessment & Plan Note (Signed)
Vit D slightly low, but she had missed a few doses of vitamin D recently.  Per Dr. Julien Nordmann.  I'll defer about DXA.   Patient agrees.  Continue vitamin D replacement otherwise.

## 2017-01-23 NOTE — Assessment & Plan Note (Signed)
Controlled.  Compliant.  No adverse effect to medication.  Continue as is.  She agrees.

## 2017-01-23 NOTE — Assessment & Plan Note (Signed)
Per neurology.  Compliant with medications.  Fall risk is likely the main issue.  Discussed with patient about home balance exercises.  She will restart.  Okay for outpatient follow-up.  I appreciate the help of all involved. >25 minutes spent in face to face time with patient, >50% spent in counselling or coordination of care.

## 2017-01-31 ENCOUNTER — Ambulatory Visit (INDEPENDENT_AMBULATORY_CARE_PROVIDER_SITE_OTHER): Payer: Medicare Other | Admitting: Cardiovascular Disease

## 2017-01-31 ENCOUNTER — Ambulatory Visit: Payer: Medicare Other | Admitting: Neurology

## 2017-01-31 ENCOUNTER — Encounter: Payer: Self-pay | Admitting: Cardiovascular Disease

## 2017-01-31 VITALS — BP 128/78 | HR 96 | Ht 63.0 in | Wt 130.5 lb

## 2017-01-31 DIAGNOSIS — R079 Chest pain, unspecified: Secondary | ICD-10-CM | POA: Diagnosis not present

## 2017-01-31 NOTE — Patient Instructions (Signed)

## 2017-02-03 ENCOUNTER — Ambulatory Visit (INDEPENDENT_AMBULATORY_CARE_PROVIDER_SITE_OTHER): Payer: Medicare Other | Admitting: Family Medicine

## 2017-02-03 ENCOUNTER — Ambulatory Visit (INDEPENDENT_AMBULATORY_CARE_PROVIDER_SITE_OTHER)
Admission: RE | Admit: 2017-02-03 | Discharge: 2017-02-03 | Disposition: A | Discharge status: Home / Self Care | Payer: Medicare Other | Source: Ambulatory Visit | Attending: Family Medicine | Admitting: Family Medicine

## 2017-02-03 ENCOUNTER — Encounter: Payer: Self-pay | Admitting: Family Medicine

## 2017-02-03 VITALS — BP 120/66 | HR 93 | Temp 98.6°F | Wt 127.0 lb

## 2017-02-03 DIAGNOSIS — M25511 Pain in right shoulder: Secondary | ICD-10-CM

## 2017-02-03 DIAGNOSIS — M79601 Pain in right arm: Secondary | ICD-10-CM

## 2017-02-03 DIAGNOSIS — R296 Repeated falls: Secondary | ICD-10-CM

## 2017-02-03 DIAGNOSIS — G20 Parkinson's disease: Secondary | ICD-10-CM

## 2017-02-03 DIAGNOSIS — M810 Age-related osteoporosis without current pathological fracture: Secondary | ICD-10-CM

## 2017-02-03 DIAGNOSIS — M19011 Primary osteoarthritis, right shoulder: Secondary | ICD-10-CM | POA: Diagnosis not present

## 2017-02-03 MED ORDER — DICLOFENAC SODIUM 1 % TD GEL: 1.0000 "application " | Freq: Three times a day (TID) | TRANSDERMAL | 1 refills | Status: DC

## 2017-02-03 NOTE — Assessment & Plan Note (Signed)
PD contributes to imbalance and fall risk. Not using any ambulatory assistive device. She has home balance exercises she can do.

## 2017-02-03 NOTE — Assessment & Plan Note (Addendum)
Anticipate biceps and rotator cuff tendonitis after fall suffered 1 month ago.  She did have tenderness at [facility] joint on right, but negative crossover test - ddx includes shoulder separation.  Given duration and comorbidities, check xray humerus and shoulder to r/o fracture/dislocation or other.  Continue tramadol, add voltaren gel.  Provided with exercises from [facility] pt advisor.  I did ask her to return for further eval if not improving with treatment.

## 2017-02-03 NOTE — Progress Notes (Signed)
BP 120/66 (BP Location: Left Arm, Patient Position: Sitting, Cuff Size: Normal)   Pulse 93   Temp 98.6 F (37 C) (Oral)   Wt 127 lb (57.6 kg)   SpO2 99%   BMI 22.50 kg/m    CC: R arm pain Subjective:    Patient ID: [name], female    DOB: 1940/04/03, 77 y.o.   MRN: [id number]  HPI: AVIAN GREENAWALT is a 77 y.o. female presenting on 02/03/2017 for Arm Pain [place] about 1-2 mo ago on right arm. Has had pain off and on since then, worsening and more frequent. Not taking anything.) and Tingling (In bilateral LEs off and on, mostly when in bed. Started about 1 yr ago, maybe more.)   1 month ago suffered fall where lost balance and fell backward - landed on R shoulder against fireplace. Progressively worsening pain at R upper arm, into shoulder and radiating up into neck. Denies paresthesias, numbness or weakness of right arm.   Hasn't tried anything for this other than tramadol without much improvement.  High fall risk due to parkinson's disease.  OP - on prolia.   To discuss LE paresthesias with PCP.   Relevant past medical, surgical, family and social history reviewed and updated as indicated. Interim medical history since our last visit reviewed. Allergies and medications reviewed and updated. Outpatient Medications Prior to Visit  Medication Sig Dispense Refill  . aspirin 81 MG tablet Take 81 mg by mouth daily.      [name] b complex vitamins tablet Take 1 tablet by mouth daily.    . Calcium 1500 MG tablet Take 1,500 mg by mouth.      . carbidopa-levodopa (SINEMET IR) 25-100 MG tablet TAKE 1 & 1/2 (ONE & ONE-HALF) TABLETS BY MOUTH THREE TIMES DAILY 405 tablet 1  . Cholecalciferol 5000 units TABS Take 1 tablet (5,000 Units total) by mouth daily.    . clonazePAM (KLONOPIN) 0.5 MG tablet TAKE ONE-HALF TABLET BY MOUTH ONCE DAILY AT BEDTIME 45 tablet 1  . Denosumab (PROLIA [place]) Inject into the skin every 6 (six) months.    . Elastic Bandages & Supports (ABDOMINAL BINDER/ELASTIC  SMALL) MISC 1 Device by Does not apply route daily. 1 each 0  . fluticasone (FLONASE) 50 MCG/ACT nasal spray Place 2 sprays into both nostrils daily. 16 g 1  . Multiple Vitamin (MULTIVITAMIN) capsule 2 tabs po qd     . nitroGLYCERIN (NITROSTAT) 0.3 MG SL tablet Place 1 tablet (0.3 mg total) under the tongue every 5 (five) minutes as needed (max 3 doses). Reported on 04/07/2015 25 tablet 3  . Omega-3 Fatty Acids (OMEGA 3 PO) 1 tab po qd     . omeprazole (PRILOSEC) 20 MG capsule Take 1 capsule (20 mg total) by mouth daily. 90 capsule 3  . tamoxifen (NOLVADEX) 20 MG tablet TAKE 1 TABLET BY MOUTH ONCE DAILY 30 tablet 2  . traMADol (ULTRAM) 50 MG tablet TAKE ONE TO TWO TABLETS BY MOUTH AT BEDTIME AS NEEDED FOR NECK PAIN 60 tablet 5  . venlafaxine XR (EFFEXOR-XR) 75 MG 24 hr capsule TAKE 1 CAPSULE BY MOUTH ONCE DAILY WITH  BREAKFAST 30 capsule 5  . vitamin B-12 (CYANOCOBALAMIN) 500 MCG tablet Take 2500 mcg daily.     No facility-administered medications prior to visit.      Per HPI unless specifically indicated in ROS section below Review of Systems     Objective:    BP 120/66 (BP Location: Left Arm, Patient Position:  Sitting, Cuff Size: Normal)   Pulse 93   Temp 98.6 F (37 C) (Oral)   Wt 127 lb (57.6 kg)   SpO2 99%   BMI 22.50 kg/m   Wt Readings from Last 3 Encounters:  02/03/17 127 lb (57.6 kg)  01/31/17 130 lb 8 oz (59.2 kg)  01/21/17 126 lb 12 oz (57.5 kg)    Physical Exam  Constitutional: She is oriented to person, place, and time. She appears well-developed and well-nourished. No distress.  Musculoskeletal: She exhibits no edema.  Tender to palpation midline ~C6 spine Point tender to palpation of distal medial humerus FROM at R elbow. L shoulder WNL  R shoulder exam: Carries R shoulder inferiorly to L  Tender to palpation throughout anterior and posterior shoulder and along trapezius, along AC joint  FROM in abduction and forward flexion. No pain or weakness with testing  SITS in ext/int rotation. + pain with empty can sign. + Speed test. Mild pain with crossover test. No significant pain with rotation of humeral head in [place] joint.   Neurological: She is alert and oriented to person, place, and time.  Skin: Skin is warm and dry. No rash noted.  Nursing note and vitals reviewed.      Assessment & Plan:   Problem List Items Addressed This Visit    Osteoporosis   PD (Parkinson's disease) ([place])   Recurrent falls    PD contributes to imbalance and fall risk. Not using any ambulatory assistive device. She has home balance exercises she can do.       Right arm pain - Primary    Anticipate biceps and rotator cuff tendonitis after fall suffered 1 month ago.  She did have tenderness at [facility] joint on right, but negative crossover test - ddx includes shoulder separation.  Given duration and comorbidities, check xray humerus and shoulder to r/o fracture/dislocation or other.  Continue tramadol, add voltaren gel.  Provided with exercises from [facility] pt advisor.  I did ask her to return for further eval if not improving with treatment.       Relevant Orders   DG Humerus Right (Completed)   DG Shoulder Right (Completed)   Shoulder pain   Relevant Orders   DG Humerus Right (Completed)   DG Shoulder Right (Completed)       Follow up plan: Return in about 3 months (around 05/04/2017), or if symptoms worsen or fail to improve.  Ria Bush, MD

## 2017-02-03 NOTE — Patient Instructions (Addendum)
xrays looking ok - we will call you if any change.  I think you have ongoing arm strain.  May use tramadol as needed, add voltaren gel to shoulder. Let us know if not improving with this.  Do shoulder tendonitis exercises provided today.  If not improving with this, return to see [place] in 1-2 weeks.

## 2017-02-04 ENCOUNTER — Telehealth: Payer: Self-pay | Admitting: Family Medicine

## 2017-02-04 NOTE — Telephone Encounter (Signed)
Copied from [place] [id number]. Topic: Quick Communication - Rx Refill/Question >> Feb 04, 2017  3:05 PM Oliver Pila B wrote: Medication: diclofenac sodium (VOLTAREN) 1 % GEL [[id number]]   Has the patient contacted their pharmacy? yes   Pt called b/c Rx is too expensive and seeing if there is another alternative available for her; the pharmacy will attempt to contact as well

## 2017-02-06 NOTE — Telephone Encounter (Signed)
I don't know of another topical nsaid gel.  Please have her check with insurance/pharmacy about an alternative.  Thanks.

## 2017-02-07 NOTE — Telephone Encounter (Signed)
Left detailed message on voicemail.  

## 2017-02-10 DIAGNOSIS — R928 Other abnormal and inconclusive findings on diagnostic imaging of breast: Secondary | ICD-10-CM | POA: Diagnosis not present

## 2017-02-10 DIAGNOSIS — Z853 Personal history of malignant neoplasm of breast: Secondary | ICD-10-CM | POA: Diagnosis not present

## 2017-02-11 ENCOUNTER — Encounter: Payer: Self-pay | Admitting: Neurology

## 2017-02-14 NOTE — Progress Notes (Signed)
[name] was seen today in the movement disorders clinic for neurologic consultation at the request of Megan Dunnings [name], MD.  The consultation is for the evaluation of tremor.  The records that were made available to me were reviewed.  No one accompanies the patient.   The first symptom(s) the patient noticed was unilateral hand tremor on the L and this was 4-5 months ago.  She is R hand dominant.  She states that it was intermittent when it started but now it is more constant.  No tremor elsewhere.    Only living relative with PD is a cousin, who is currently being treated at [name].  08/17/13 update:  Pt states that tremor is improved on levodopa but not sure if it is otherwise helping (but may be).  Takes med at 8am/1pm/5-7pm.  No SE.  Blood pressure has been low but states that it is always low.  No lightheadedness.  Balance is not good, but no falls.  She completed therapy.  Is exercising at home.  She walks an hr a day 6 days per week and when she goes to the gym, she will use the treadmill.  Has signed up for PD exercise class.   11/20/13 update:  Pt f/u re: PD.  Is on carbidopa/levodopa 25/100 tid.  Is doing well.  No falls.  No lightheadedness.  No hallucinations.  Exercising faithfully.   Is having an aching pain in the shoulder and in the thumb region on the R side but PD primarily affects her on the L.  Some occasional tremor on the L.    04/10/14 update:  Pt is on carbidopa/levodopa 25/100 tid.  She is stable in regards to PD but unfortunately her husband just died 106 days ago.  She just started back exercising with her sister in law.  No SI/HI.  No falls.  No lightheadeness.  Some tremor but no more than usual.  08/12/14 update:  Pt is on carbidopa/levodopa 25/100 three times per day.  She has done PT/OT/ST since our last visit and just completed her last therapy session on 08/01/14.  She had an injection in her shoulder as well and then had therapy for that and it is feeling better.   No falls since our last visit.  No lightheadedness.  She is exercising faithfully.  She is walking for exercise 6 times a week.  She admits that she is crying a lot and states that it started before her husband died and sometimes it is crying over inconsequential stuff.  Sometimes, she cries over her husbands death but doesn't want medication for that.  She is not SI/HI.  12/16/14 update:  Pt is following up today and is still on carbidopa/levodopa 25/100 tid.   No significant changes since last visit.  She goes to the gym 6 days a week.  Sometimes, she will go two times a day.   She is going to be spending thanksgiving with her son.    Wearing off:  No.  How long before next dose:  n/a Falls:   No. N/V:  No. (had some nausea this past weekend but thinks related to food she ate) Hallucinations:  No.  visual distortions: No. Lightheaded:  Yes.  , just last few days  Syncope: No. Dyskinesia:  No.   04/15/15 update:  The patient presents today for follow-up.  She has a history of Parkinson's disease.  She is currently on carbidopa/levodopa 25/100, 3 times per day.  Overall, she  reports that she is doing well.  She denies any falls.  No hallucinations.  No lightheadedness or near syncope.  She is still exercising.  She started occupational therapy for her shoulder pain.  I saw that speech therapy recommended that she see GI because she had her esophagus structure in the past.  She has an appointment with [name] on March 30.  She states that she is having a little problem swallowing but "it isn't bad yet."  States that she is crying for unknown reason (watching the news, etc).  Doesn't think that it is depression.  09/09/15 update:  The patient follows up today.  I have reviewed multiple records since her last visit.  She remains on carbidopa/levodopa 25/100, one tablet 3 times per day.  Last visit, I was going to start her on Nuedexta, but it turns out that this interfered with her tamoxifen.  We ended  up starting her on Effexor XR, 75 mg daily.  The patient states that the crying spells resolved with the effexor.  She did have a modified barium swallow on 04/21/2015 that was normal.  She continued to have a globus sensation and saw [name] for this.  He felt that she had mild oropharyngeal dysphagia related to Parkinson's, but that was not demonstrated on her barium swallow.  He did do a CT of the abdomen that was negative.  The patient did go to the emergency room on 05/07/2015 with headache.  That has since resolved.  She has also been to Dr. [name] with lightheadedness, and also described vertigo to the physical therapist and they recommended vestibular rehabilitation.  She has been going to rehabilitation therapy.   That did help.  She has not had any falls.  No syncopal episodes.  No falls.  She does tell me she had surgery on 7/18 by [name] for rotator cuff.  She is still in a sling.    10/15/15 update:  The patient follows up today.  She is on carbidopa/levodopa 25/100, one tablet 3 times per day.  She is also on Effexor XR, 75 mg daily.  She feels that mood has been good.  She has significant orthostatic hypotension, but I was leery about giving her medication for this last visit because she was complaining about a lot of chest pain.  Not long after that, she saw [name], who ordered an echocardiogram which was performed on 09/23/2015 and was unremarkable.  Ejection fraction was 55-60%.  She had a nuclear stress test and there was no significant reversible ischemia. LVEF 67% with normal wall motion on the stress Myoview.  She did fall this past Sunday and hit her head.  She was putting on underwear and tripped and her face hit the floor.  Everytime she chews, her L face and jaw hurt.  She also has a bilateral frontal headache.  "It feels like a regular headache like I have had before."   Was told that she couldn't do therapy yesterday until she saw her doctor.  L shoulder is a bit sore.  Admits  that she hasn't been drinking quite as much water.  11/18/15 update:  The patient follows up today.  She remains on carbidopa/levodopa 25/100, one tablet 3 times per day.  She did have a CT of the brain and maxillofacial regions right after our last visit, due to a fall.  Fortunately, there was no intracranial abnormalities and no facial fractures.  She has recovered nicely.  She has had no  further falls.  Re: lightheaded, feeling a "little dizzy" with standing.  Thinks dizziness has not been "bad."  Wears abdominal binder some but makes her "hot."    Her mood is doing good on Effexor XR, 75 mg daily.  "I don't cry any more all of the time."  Going to PT and she is walking for exercise.  Is back to drinking about 5-6 bottles water per day.  She has started to note tremor in the RUE but not as much as in the LUE  02/18/16 update:  Patient follows up today, on carbidopa/levodopa 25/100, one tablet 3 times per day.  Overall, the patient states she has been stable.  She has had several falls since our last visit.  One time, she was vacuuming and moving backward and fell.  Two times, she fell on a curb.  She also fell backing up in the bathroom and she hit the toilet and the bathtub.  She got a "little hurt."  The last time she was dreaming and she fell out of the bed and hit her head on the floor.  That was about a week ago.  Denies LOC/alternation in consciousness.   Last PT for PD at end of July.  Had therapy for shoulder in November after shoulder surgery.   Occasional lightheadedness but no near syncope.  Mood has been good on Effexor XR, 75 mg daily.  05/21/16 update:  Patient seen today in follow-up.  She is on carbidopa/levodopa 25/100, one tablet 3 times per day.  Last visit, we started clonazepam 0.5 mg, half tablet at night for REM behavior disorder.  Reports that this has helped without side effects.  "I can't even tell I take it except I stay in the bed."  She has had no "real" falls since our last  visit.  She does tell me she slid off the bed once when she reached to get her remote control.  She then tells me about a fall backwards but she actually fell backward on the bed.     Her mood remains good on Effexor.  No hallucinations.  No lightheadedness or near syncope.  She is exercising daily at the gym.  She is in PT now.    09/29/16 update:  Patient seen in follow-up for her Parkinson's disease.  I increased her carbidopa/levodopa 25/100 last visit, so that she is now taking 1-1/2 tablets 3 times per day.  She states that she noted no big difference.  She is having no dyskinesia.  She is on clonazepam 0.5 mg, half tablet at night for REM behavior disorder.  This has worked well but she wonders if it causes hangover effects.   Her mood has been good with low dose Effexor, 75 mg daily.  She had cataracts extracted and she is happy with the results.  Dizziness has improved.  Not as much exercise as gym she was using closed.  02/15/17 update: Patient seen in follow-up for Parkinson's disease.  She is on carbidopa/levodopa 25/100, 1-1/2 tablets 3 times per day.  She had a fall in December and hurt her arm.  She was stepping backwards and tripped over something on the floor and fell on the fireplace on the arm.  She is still having pain in the arm that she fell arm. Around the same time, she fell off a friends porch.  She didn't get hurt that time.    No lightheadedness or near syncope.  Drinking water has improved her prior symptom of dizziness.  No hallucinations.  No visual distortions.  She remains on clonazepam 0.5 mg, half a tablet at night for REM behavior disorder.  Mood is good on Effexor, 75 mg daily.  On klonopin for RBD.  Awaiting prior auth and I wrote letter about this and submitted to her insurance company.    States that she has not been acting out the dreams.   I have reviewed records made available to me since last visit.  Neuroimaging has not previously been performed.  It was supposed to be  done 06/01/13 but pt cancelled as she was claustrophobic and couldn't do the scan even though it was in an open unit.   PREVIOUS MEDICATIONS: none to date  ALLERGIES:   Allergies  Allergen Reactions  . Penicillins Shortness Of Breath and Itching    CURRENT MEDICATIONS:  Current Outpatient Medications on File Prior to Visit  Medication Sig Dispense Refill  . aspirin 81 MG tablet Take 81 mg by mouth daily.      [name] b complex vitamins tablet Take 1 tablet by mouth daily.    . Calcium 1500 MG tablet Take 1,500 mg by mouth.      . carbidopa-levodopa (SINEMET IR) 25-100 MG tablet TAKE 1 & 1/2 (ONE & ONE-HALF) TABLETS BY MOUTH THREE TIMES DAILY 405 tablet 1  . Cholecalciferol 5000 units TABS Take 1 tablet (5,000 Units total) by mouth daily.    . clonazePAM (KLONOPIN) 0.5 MG tablet TAKE ONE-HALF TABLET BY MOUTH ONCE DAILY AT BEDTIME 45 tablet 1  . Denosumab (PROLIA [place]) Inject into the skin every 6 (six) months.    . diclofenac sodium (VOLTAREN) 1 % GEL Apply 1 application topically 3 (three) times daily. 1 Tube 1  . Elastic Bandages & Supports (ABDOMINAL BINDER/ELASTIC SMALL) MISC 1 Device by Does not apply route daily. 1 each 0  . fluticasone (FLONASE) 50 MCG/ACT nasal spray Place 2 sprays into both nostrils daily. 16 g 1  . Multiple Vitamin (MULTIVITAMIN) capsule 2 tabs po qd     . Omega-3 Fatty Acids (OMEGA 3 PO) 1 tab po qd     . omeprazole (PRILOSEC) 20 MG capsule Take 1 capsule (20 mg total) by mouth daily. 90 capsule 3  . tamoxifen (NOLVADEX) 20 MG tablet TAKE 1 TABLET BY MOUTH ONCE DAILY 30 tablet 2  . traMADol (ULTRAM) 50 MG tablet TAKE ONE TO TWO TABLETS BY MOUTH AT BEDTIME AS NEEDED FOR NECK PAIN 60 tablet 5  . venlafaxine XR (EFFEXOR-XR) 75 MG 24 hr capsule TAKE 1 CAPSULE BY MOUTH ONCE DAILY WITH  BREAKFAST 30 capsule 5  . vitamin B-12 (CYANOCOBALAMIN) 500 MCG tablet Take 2500 mcg daily.    . nitroGLYCERIN (NITROSTAT) 0.3 MG SL tablet Place 1 tablet (0.3 mg total) under the tongue every  5 (five) minutes as needed (max 3 doses). Reported on 04/07/2015 (Patient not taking: Reported on 02/15/2017) 25 tablet 3   No current facility-administered medications on file prior to visit.     PAST MEDICAL HISTORY:   Past Medical History:  Diagnosis Date  . Allergy    SEASONAL  . Anemia    Anemia-NOS / PMH, Dr Julien Nordmann  . Arthritis   . Breast cancer ([place])    stage I right breast  . Cataract    BILATERAL  . Decreased vision    R eye  . Esophageal Stricture   . Hyperlipidemia   . Monoclonal gammopathy   . MVP (mitral valve prolapse)   . Osteoporosis   .  Parkinson disease ([place])   . Skin cancer    basal cell L neck  . Vertical diplopia     PAST SURGICAL HISTORY:   Past Surgical History:  Procedure Laterality Date  . APPENDECTOMY  2002  . BONE BIOPSY  11/06/10  . BREAST LUMPECTOMY  02/2010   right breast lumpectomy and sentinel node biopsy  . CARDIAC CATHETERIZATION  2005   neg  . CATARACT EXTRACTION, BILATERAL  2018  . CATARACT EXTRACTION, BILATERAL Bilateral 07/2016  . COLONOSCOPY     negative   . HEMORRHOID SURGERY  1970's  . SALPINGOOPHORECTOMY  2002   for benign growths  . SHOULDER SURGERY Right   . TOTAL ABDOMINAL HYSTERECTOMY W/ BILATERAL SALPINGOOPHORECTOMY  77 yrs old   for pain,prolapse ; G3 P2  . UPPER GASTROINTESTINAL ENDOSCOPY      SOCIAL HISTORY:   Social History   Socioeconomic History  . Marital status: Widowed    Spouse name: Not on file  . Number of children: 2  . Years of education: Not on file  . Highest education level: Not on file  Social Needs  . Financial resource strain: Not on file  . Food insecurity - worry: Not on file  . Food insecurity - inability: Not on file  . Transportation needs - medical: Not on file  . Transportation needs - non-medical: Not on file  Occupational History  . Occupation: Retired  Tobacco Use  . Smoking status: Never Smoker  . Smokeless tobacco: Never Used  Substance and Sexual Activity  . Alcohol  use: Yes    Alcohol/week: 0.6 oz    Types: 1 Glasses of wine per week  . Drug use: No  . Sexual activity: Not on file  Other Topics Concern  . Not on file  Social History Narrative   Occupation: Textile   Widowed 2016, husband had cancer, was married in 1959    Patient has never smoked.    Illicit Drug Use - no   Patient does not get regular exercise.    Daily Caffeine Use: 1 cup of coffee daily    FAMILY HISTORY:   Family Status  Relation Name Status  . Mother  Deceased       heart disease  . Brother  Alive       diabetes  . Brother  Deceased       heart disease, renal failure  . Father  Deceased       hepatitis  . Sister  Alive       diabetes  . Brother  Deceased       drowned as a teenager  . Brother  Alive       diabetes  . Brother  Alive       diabetes  . Sister  Alive       diabetes, liver disease (alcohol)  . Sister  Alive       healthy  . Son  Alive       sarcoidosis  . Daughter  Alive       healthy  . Cousin  Alive       Parkinsons disease  . Neg Hx  (Not Specified)    ROS:  A complete 10 system review of systems was obtained and was unremarkable apart from what is mentioned above.  PHYSICAL EXAMINATION:    VITALS:   Vitals:   02/15/17 1101  BP: 136/70  Pulse: 85  SpO2: 98%  Weight: 129 lb (58.5 kg)  Height:  5\' 3"  (1.6 m)    Gen:  Appears stated age and in NAD. HEENT:  Normocephalic, atraumatic. The mucous membranes are moist. The superficial temporal arteries are without ropiness or tenderness. Cardiovascular: Regular rate and rhythm. Lungs: Clear to auscultation bilaterally. Neck: There are no carotid bruits noted bilaterally.  NEUROLOGICAL:  Orientation:  The patient is alert and oriented x 3.   Cranial nerves: There is good facial symmetry.  Speech is fluent and clear. Soft palate rises symmetrically and there is no tongue deviation. Hearing is intact to conversational tone. Tone: Tone is good throughout. Sensation: Sensation is  intact to light touch  Coordination:  The patient has no difficulty with RAM's or FNF bilaterally. Motor: Strength is 5/5 in the bilateral upper and lower extremities.  Shoulder shrug is equal bilaterally.  There is no pronator drift.  There are no fasciculations noted.  Movement examination: Tone: There is normal tone in the upper extremities Abnormal movements: There is LUE tremor.  There is dyskinesia in the legs and mild axially Coordination:  There is decremation with foot taps on the L.  All other RAMs including alternating supination and pronation of the forearm, hand opening and closing, finger taps, heel taps are normal bilaterally Gait and Station: The patient has no trouble getting OOC.  Walks well with good arm swing.  ASSESSMENT/PLAN:  1.  Idiopathic Parkinson's disease.  She was dx in May, 2015.    -She looks better on carbidopa/levodopa 25/100, 1.5 tablets 3 times per day.  She will continue on this.  Risks, benefits, side effects and alternative therapies were discussed.  The opportunity to ask questions was given and they were answered to the best of my ability.  The patient expressed understanding and willingness to follow the outlined treatment protocols.  -she has some dyskinesia but this is not bothersome to her.  Will monitor  -return to [name] re: R shoulder pain after fall.  I will try to make appt for her today 2.  Depression  -doing well on Effexor XR, 75 mg daily.  No longer with crying spells and feels good.  Now has a new "man" friend and I think that this has helped.  Also has a new 75 week old grandbaby. 4.  Dysphagia  -MBE on 04/21/15 was normal  -[name] did not think this was GI issue  -pt reports that this is resolved on 02/15/17 5.  Orthostatic hypotension  -Drinking more water now and feeling less dizzy  -Talked to her about Northera but orthostatics today are much better than in the past and worry about creating supine HTN.  Wear compression binder as  much as possible, which she is doing.   6.  REM behavior disorder  -doing better with klonopin - 0.5 mg - 1/2 tablet at night.    -awaiting prior auth on klonopin from insurance company 7.  Follow up is anticipated in the next few months, sooner should new neurologic issues arise.  Much greater than 50% of this visit was spent in counseling and coordinating care.  Total face to face time:  25 min

## 2017-02-15 ENCOUNTER — Telehealth: Payer: Self-pay | Admitting: Neurology

## 2017-02-15 ENCOUNTER — Encounter: Payer: Self-pay | Admitting: Neurology

## 2017-02-15 ENCOUNTER — Ambulatory Visit (INDEPENDENT_AMBULATORY_CARE_PROVIDER_SITE_OTHER): Payer: Medicare Other | Admitting: Neurology

## 2017-02-15 VITALS — BP 136/70 | HR 85 | Ht 63.0 in | Wt 129.0 lb

## 2017-02-15 DIAGNOSIS — G4752 REM sleep behavior disorder: Secondary | ICD-10-CM

## 2017-02-15 DIAGNOSIS — G20 Parkinson's disease: Secondary | ICD-10-CM

## 2017-02-15 DIAGNOSIS — M25511 Pain in right shoulder: Secondary | ICD-10-CM | POA: Diagnosis not present

## 2017-02-15 DIAGNOSIS — F330 Major depressive disorder, recurrent, mild: Secondary | ICD-10-CM

## 2017-02-15 NOTE — Telephone Encounter (Signed)
Tameka with [employer] left a message on the voicemail regarding this patient and [employer] medicare appeal for the Clonazepam medication. They are needing to know if the diagnosis is Dystonia and does she have night terrors and or sleep walk? Please Call. Thanks

## 2017-02-15 NOTE — Telephone Encounter (Signed)
She has REM behavior disorder.  She fell out of the bed and hit her head which is described in detail in medical records.

## 2017-02-15 NOTE — Telephone Encounter (Signed)
Dr. Tat patient 

## 2017-02-15 NOTE — Patient Instructions (Signed)
1. Appt made with [name] on 02/21/17 at 1:45 pm. If this is not a good date/time please call [phone].

## 2017-02-16 ENCOUNTER — Telehealth: Payer: Self-pay | Admitting: Neurology

## 2017-02-16 NOTE — Telephone Encounter (Signed)
Already spoke with them about this. Resent appeal letter with note from yesterday to fax number provided.

## 2017-02-16 NOTE — Telephone Encounter (Signed)
Spoke with [employer]  

## 2017-02-16 NOTE — Telephone Encounter (Signed)
[employer] called and asked for a phone call back regarding an appeal for [place], wants to know if the pt has a diagnosis of dystonia and if she has night terrors and sleep walking CB# [phone] ok to leave a message if no answer

## 2017-02-17 ENCOUNTER — Telehealth: Payer: Self-pay | Admitting: Neurology

## 2017-02-17 NOTE — Telephone Encounter (Signed)
Tameka with [employer] called to let you know that the Clonazepam medication has been approved. Effective 02/09/17 through 02/09/2018. She will be responding to Dr. Carles Collet as well as the patient in writing. The Auth # is [id number]. Thanks

## 2017-02-21 DIAGNOSIS — M19011 Primary osteoarthritis, right shoulder: Secondary | ICD-10-CM | POA: Diagnosis not present

## 2017-02-22 ENCOUNTER — Other Ambulatory Visit: Payer: Self-pay | Admitting: Internal Medicine

## 2017-02-22 DIAGNOSIS — C50919 Malignant neoplasm of unspecified site of unspecified female breast: Secondary | ICD-10-CM

## 2017-03-10 ENCOUNTER — Ambulatory Visit: Payer: Medicare Other | Attending: Family Medicine | Admitting: Physical Therapy

## 2017-03-10 ENCOUNTER — Ambulatory Visit: Payer: Medicare Other

## 2017-03-10 ENCOUNTER — Ambulatory Visit: Payer: Medicare Other | Admitting: Occupational Therapy

## 2017-03-10 DIAGNOSIS — R2689 Other abnormalities of gait and mobility: Secondary | ICD-10-CM | POA: Insufficient documentation

## 2017-03-10 DIAGNOSIS — R1310 Dysphagia, unspecified: Secondary | ICD-10-CM

## 2017-03-10 DIAGNOSIS — R471 Dysarthria and anarthria: Secondary | ICD-10-CM | POA: Insufficient documentation

## 2017-03-10 DIAGNOSIS — R29818 Other symptoms and signs involving the nervous system: Secondary | ICD-10-CM

## 2017-03-10 NOTE — Therapy (Signed)
[facility] [address] [place], [name], [address] Phone: [phone]   Fax:  [phone]  Patient Details  Name: [name] MRN: [id number] Date of Birth: 1940/07/31 Referring Provider: Alonza Bogus, DO  Encounter Date: 03/10/2017  Speech Therapy Parkinson's Disease Screen   Decibel Level today: average upper 60s dB  (WNL=70-72 dB) with sound level meter 30cm away from pt's mouth. Pt's conversational volume has decreased since last treatment course. Pt's friend reports he has difficulty hearing her when they are together  Pt has experienced difficulty in swallowing warranting objective evaluation; She states she coughs at least once a day during meals.  Modified barium swallow is recommended.   Pt would benefit from speech-language eval for dysarthria; please order via Epic and our front office will call pt to schedule. A modified barium swallow eval is also recommended- please order via EPIC or call [phone] to schedule his exam at [facility] or [facility] ,[place], [place]  03/10/2017, 9:13 AM  [facility] [address] [place], [name], [address] Phone: [phone]   Fax:  [phone]

## 2017-03-10 NOTE — Therapy (Signed)
[facility] [address] [place], [name], [address] Phone: [phone]   Fax:  [phone]  Patient Details  Name: [name] MRN: [id number] Date of Birth: 1940/11/20 Referring Provider:  Tonia Ghent, MD  Encounter Date: 03/10/2017   Physical Therapy Parkinson's Disease Screen   Timed Up and Go test: 9.5 sec  10 meter walk test:  8.09 sec = 4.05 ft/sec  5 time sit to stand test:13.06 sec  Pt reports several falls in the past 6 months, but would prefer to get back to consistent work on therapy HEP and return for screen in 6 months.   Patient does not require Physical Therapy services at this time.  Recommend Physical Therapy screen in 6 months.     [place] Record W. 03/10/2017, 8:10 AM  Frazier Butt., PT   [facility] [address] [place], [name], [address] Phone: [phone]   Fax:  [phone]

## 2017-03-10 NOTE — Therapy (Signed)
[facility] [address] [place], [name], [address] Phone: [phone]   Fax:  [phone]  Patient Details  Name: [name] MRN: [id number] Date of Birth: 10/10/1940 Referring Provider:  Tonia Ghent, MD  Encounter Date: 03/10/2017  Occupational Therapy Parkinson's Disease Screen  Hand dominance:  right   Physical Performance Test item #4 (donning/doffing jacket):  8.87 sec, no pain.  Fastening/unfastening 3 buttons in:  25.09sec  9-hole peg test:    RUE  22.75 sec        LUE  27.09 sec  Change in ability to perform ADLs/IADLs:  No.  1 fall approx 1 month ago.  Other Comments:  Pt reports recent R cortisone injection for shoulder pain after fall, with pain currently 3/10 with end range abduction.  Pt denies pain with ADLs.  R shouder flex 135* with no pain and abduction 125* with 3/10 pain.  Pt to monitor and if pain begins to affect ADLs, pt to notify MD for return to OT.  Pt does not require occupational therapy services at this time.  Recommended occupational therapy screen in   approx 6 months.  Pt reports that she has not been performing therapy HEP regularly.  Recommended pt resume regular performance of HEP.  Pt verbalized understanding/agreement.    [facility] 03/10/2017, 8:35 AM  [facility] [address] [place], [name], [address] Phone: [phone]   Fax:  Alicia, OTR/L [facility] [address] [place], [place]  [address] [phone] phone [phone] 03/10/17 8:45 AM

## 2017-03-22 ENCOUNTER — Telehealth: Payer: Self-pay | Admitting: Internal Medicine

## 2017-03-22 NOTE — Telephone Encounter (Signed)
Patient called to reschedule  °

## 2017-04-05 ENCOUNTER — Inpatient Hospital Stay: Payer: Medicare Other | Attending: Internal Medicine

## 2017-04-05 DIAGNOSIS — Z7982 Long term (current) use of aspirin: Secondary | ICD-10-CM | POA: Insufficient documentation

## 2017-04-05 DIAGNOSIS — C50911 Malignant neoplasm of unspecified site of right female breast: Secondary | ICD-10-CM | POA: Insufficient documentation

## 2017-04-05 DIAGNOSIS — C50011 Malignant neoplasm of nipple and areola, right female breast: Secondary | ICD-10-CM

## 2017-04-05 DIAGNOSIS — Z79899 Other long term (current) drug therapy: Secondary | ICD-10-CM | POA: Insufficient documentation

## 2017-04-05 DIAGNOSIS — Z170 Estrogen receptor positive status [ER+]: Secondary | ICD-10-CM | POA: Insufficient documentation

## 2017-04-05 DIAGNOSIS — D472 Monoclonal gammopathy: Secondary | ICD-10-CM | POA: Diagnosis not present

## 2017-04-05 DIAGNOSIS — Z79810 Long term (current) use of selective estrogen receptor modulators (SERMs): Secondary | ICD-10-CM | POA: Diagnosis not present

## 2017-04-05 LAB — CBC WITH DIFFERENTIAL/PLATELET
Basophils Absolute: 0.1 10*3/uL (ref 0.0–0.1)
Basophils Relative: 1 %
Eosinophils Absolute: 0.2 10*3/uL (ref 0.0–0.5)
Eosinophils Relative: 3 %
HCT: 36.6 % (ref 34.8–46.6)
Hemoglobin: 12.1 g/dL (ref 11.6–15.9)
Lymphocytes Relative: 25 %
Lymphs Abs: 1.4 10*3/uL (ref 0.9–3.3)
MCH: 32.2 pg (ref 25.1–34.0)
MCHC: 32.9 g/dL (ref 31.5–36.0)
MCV: 97.8 fL (ref 79.5–101.0)
Monocytes Absolute: 0.2 10*3/uL (ref 0.1–0.9)
Monocytes Relative: 4 %
Neutro Abs: 3.8 10*3/uL (ref 1.5–6.5)
Neutrophils Relative %: 67 %
Platelets: 248 10*3/uL (ref 145–400)
RBC: 3.75 MIL/uL (ref 3.70–5.45)
RDW: 14.7 % — ABNORMAL HIGH (ref 11.2–14.5)
WBC: 5.7 10*3/uL (ref 3.9–10.3)

## 2017-04-05 LAB — COMPREHENSIVE METABOLIC PANEL
ALT: <6 U/L (ref 0–55)
AST: 27 U/L (ref 5–34)
Albumin: 3.5 g/dL (ref 3.5–5.0)
Alkaline Phosphatase: 46 U/L (ref 40–150)
Anion gap: 8 (ref 3–11)
BUN: 11 mg/dL (ref 7–26)
CO2: 27 mmol/L (ref 22–29)
Calcium: 9 mg/dL (ref 8.4–10.4)
Chloride: 107 mmol/L (ref 98–109)
Creatinine, Ser: 0.76 mg/dL (ref 0.60–1.10)
GFR calc Af Amer: >60 mL/min (ref >60)
GFR calc non Af Amer: >60 mL/min (ref >60)
Glucose, Bld: 100 mg/dL (ref 70–140)
Potassium: 3.8 mmol/L (ref 3.5–5.1)
Sodium: 142 mmol/L (ref 136–145)
Total Bilirubin: 0.4 mg/dL (ref 0.2–1.2)
Total Protein: 7.1 g/dL (ref 6.4–8.3)

## 2017-04-05 LAB — LACTATE DEHYDROGENASE: LDH: 189 U/L (ref 125–245)

## 2017-04-06 LAB — CANCER ANTIGEN 27.29: CA 27.29: 23 U/mL (ref 0.0–38.6)

## 2017-04-06 LAB — KAPPA/LAMBDA LIGHT CHAINS
Kappa free light chain: 69.3 mg/L — ABNORMAL HIGH (ref 3.3–19.4)
Kappa, lambda light chain ratio: 8.35 — ABNORMAL HIGH (ref 0.26–1.65)
Lambda free light chains: 8.3 mg/L (ref 5.7–26.3)

## 2017-04-06 LAB — IGG, IGA, IGM
IgA: 34 mg/dL — ABNORMAL LOW (ref 64–422)
IgG (Immunoglobin G), Serum: 1677 mg/dL — ABNORMAL HIGH (ref 700–1600)
IgM (Immunoglobulin M), Srm: 26 mg/dL (ref 26–217)

## 2017-04-06 LAB — BETA 2 MICROGLOBULIN, SERUM: Beta-2 Microglobulin: 1.3 mg/L (ref 0.6–2.4)

## 2017-04-11 ENCOUNTER — Other Ambulatory Visit: Payer: Medicare Other

## 2017-04-18 ENCOUNTER — Other Ambulatory Visit: Payer: Medicare Other

## 2017-04-18 ENCOUNTER — Telehealth: Payer: Self-pay

## 2017-04-18 ENCOUNTER — Inpatient Hospital Stay (HOSPITAL_BASED_OUTPATIENT_CLINIC_OR_DEPARTMENT_OTHER): Payer: Medicare Other | Admitting: Internal Medicine

## 2017-04-18 ENCOUNTER — Inpatient Hospital Stay: Payer: Medicare Other

## 2017-04-18 ENCOUNTER — Encounter: Payer: Self-pay | Admitting: Internal Medicine

## 2017-04-18 VITALS — BP 117/64 | HR 90 | Temp 99.4°F | Resp 20 | Ht 63.0 in | Wt 128.6 lb

## 2017-04-18 DIAGNOSIS — Z79899 Other long term (current) drug therapy: Secondary | ICD-10-CM | POA: Diagnosis not present

## 2017-04-18 DIAGNOSIS — Z170 Estrogen receptor positive status [ER+]: Secondary | ICD-10-CM

## 2017-04-18 DIAGNOSIS — Z7982 Long term (current) use of aspirin: Secondary | ICD-10-CM

## 2017-04-18 DIAGNOSIS — C50911 Malignant neoplasm of unspecified site of right female breast: Secondary | ICD-10-CM

## 2017-04-18 DIAGNOSIS — D472 Monoclonal gammopathy: Secondary | ICD-10-CM | POA: Diagnosis not present

## 2017-04-18 DIAGNOSIS — C50919 Malignant neoplasm of unspecified site of unspecified female breast: Secondary | ICD-10-CM

## 2017-04-18 DIAGNOSIS — C50011 Malignant neoplasm of nipple and areola, right female breast: Secondary | ICD-10-CM

## 2017-04-18 DIAGNOSIS — Z79810 Long term (current) use of selective estrogen receptor modulators (SERMs): Secondary | ICD-10-CM

## 2017-04-18 MED ORDER — DENOSUMAB 60 MG/ML ~~LOC~~ SOLN
60.0000 mg | Freq: Once | SUBCUTANEOUS | Status: AC
  Administered 2017-04-18: 60 mg via SUBCUTANEOUS

## 2017-04-18 NOTE — Patient Instructions (Signed)
Denosumab injection  What is this medicine?  DENOSUMAB (den oh sue mab) slows bone breakdown. Prolia is used to treat osteoporosis in women after menopause and in men. Xgeva is used to prevent bone fractures and other bone problems caused by cancer bone metastases. Xgeva is also used to treat giant cell tumor of the bone.  This medicine may be used for other purposes; ask your health care provider or pharmacist if you have questions.  What should I tell my health care provider before I take this medicine?  They need to know if you have any of these conditions:  -dental disease  -eczema  -infection or history of infections  -kidney disease or on dialysis  -low blood calcium or vitamin D  -malabsorption syndrome  -scheduled to have surgery or tooth extraction  -taking medicine that contains denosumab  -thyroid or parathyroid disease  -an unusual reaction to denosumab, other medicines, foods, dyes, or preservatives  -pregnant or trying to get pregnant  -breast-feeding  How should I use this medicine?  This medicine is for injection under the skin. It is given by a health care professional in a hospital or clinic setting.  If you are getting Prolia, a special MedGuide will be given to you by the pharmacist with each prescription and refill. Be sure to read this information carefully each time.  For Prolia, talk to your pediatrician regarding the use of this medicine in children. Special care may be needed. For Xgeva, talk to your pediatrician regarding the use of this medicine in children. While this drug may be prescribed for children as young as 13 years for selected conditions, precautions do apply.  Overdosage: If you think you have taken too much of this medicine contact a poison control center or emergency room at once.  NOTE: This medicine is only for you. Do not share this medicine with others.  What if I miss a dose?  It is important not to miss your dose. Call your doctor or health care professional if you are  unable to keep an appointment.  What may interact with this medicine?  Do not take this medicine with any of the following medications:  -other medicines containing denosumab  This medicine may also interact with the following medications:  -medicines that suppress the immune system  -medicines that treat cancer  -steroid medicines like prednisone or cortisone  This list may not describe all possible interactions. Give your health care provider a list of all the medicines, herbs, non-prescription drugs, or dietary supplements you use. Also tell them if you smoke, drink alcohol, or use illegal drugs. Some items may interact with your medicine.  What should I watch for while using this medicine?  Visit your doctor or health care professional for regular checks on your progress. Your doctor or health care professional may order blood tests and other tests to see how you are doing.  Call your doctor or health care professional if you get a cold or other infection while receiving this medicine. Do not treat yourself. This medicine may decrease your body's ability to fight infection.  You should make sure you get enough calcium and vitamin D while you are taking this medicine, unless your doctor tells you not to. Discuss the foods you eat and the vitamins you take with your health care professional.  See your dentist regularly. Brush and floss your teeth as directed. Before you have any dental work done, tell your dentist you are receiving this medicine.  Do   not become pregnant while taking this medicine or for 5 months after stopping it. Women should inform their doctor if they wish to become pregnant or think they might be pregnant. There is a potential for serious side effects to an unborn child. Talk to your health care professional or pharmacist for more information.  What side effects may I notice from receiving this medicine?  Side effects that you should report to your doctor or health care professional as soon as  possible:  -allergic reactions like skin rash, itching or hives, swelling of the face, lips, or tongue  -breathing problems  -chest pain  -fast, irregular heartbeat  -feeling faint or lightheaded, falls  -fever, chills, or any other sign of infection  -muscle spasms, tightening, or twitches  -numbness or tingling  -skin blisters or bumps, or is dry, peels, or red  -slow healing or unexplained pain in the mouth or jaw  -unusual bleeding or bruising  Side effects that usually do not require medical attention (Report these to your doctor or health care professional if they continue or are bothersome.):  -muscle pain  -stomach upset, gas  This list may not describe all possible side effects. Call your doctor for medical advice about side effects. You may report side effects to FDA at 1-800-FDA-1088.  Where should I keep my medicine?  This medicine is only given in a clinic, doctor's office, or other health care setting and will not be stored at home.  NOTE: This sheet is a summary. It may not cover all possible information. If you have questions about this medicine, talk to your doctor, pharmacist, or health care provider.      2016, Elsevier/Gold Standard. (2011-07-12 12:37:47)

## 2017-04-18 NOTE — Telephone Encounter (Signed)
Printed avs and calender of upcoming appointment. Per 3/25 los 

## 2017-04-18 NOTE — Progress Notes (Signed)
[facility] [phone]   [phone]  OFFICE PROGRESS NOTE  Tonia Ghent, MD [name] [address]  DIAGNOSIS:  1) Node-negative breast cancer status post completion of radiation 05/06/2010 currently on tamoxifen as well as prolia.  2) Monoclonal gammopathy of unknown significance on observation since 2007   PRIOR THERAPY:  1) status post right lumpectomy on 03/05/2010 and it showed invasive ductal carcinoma 0.9 CM, Positive for ER/PR and negative for HER-2, with ductal carcinoma in situ present and negative sentinel lymph node biopsies.  2) status post adjuvant radiotherapy completed in April of 2012   CURRENT THERAPY:  1) tamoxifen 20 mg by mouth daily started in 2012.  2) Prolia subcutaneous injection every 6 months.  INTERVAL HISTORY: [name] 77 y.o. female returns back to the clinic today for 6 months follow-up visit.  The patient is feeling fine with no specific complaints.  She denied having any recent chest pain, shortness breath, cough or hemoptysis.  She denied having any weight loss or night sweats.  She continues to have some arthralgia with aching pain in the knees and shoulder.  She has no nausea, vomiting, diarrhea or constipation.  The patient continues to tolerate her treatment with tamoxifen fairly well.  She also received Prolia every 6 months.  She had repeat myeloma panel as well as CA 27.29 and she is here today for evaluation and discussion of her lab results.    MEDICAL HISTORY: Past Medical History:  Diagnosis Date  . Allergy    SEASONAL  . Anemia    Anemia-NOS / PMH, Dr Julien Nordmann  . Arthritis   . Breast cancer ([facility])    stage I right breast  . Cataract    BILATERAL  . Decreased vision    R eye  . Esophageal Stricture   . Hyperlipidemia   . Monoclonal gammopathy   . MVP (mitral valve prolapse)   . Osteoporosis   . Parkinson disease ([facility])   . Skin cancer    basal cell L  neck  . Vertical diplopia     ALLERGIES:  is allergic to penicillins.  MEDICATIONS:  Current Outpatient Medications  Medication Sig Dispense Refill  . aspirin 81 MG tablet Take 81 mg by mouth daily.      [name] b complex vitamins tablet Take 1 tablet by mouth daily.    . Calcium 1500 MG tablet Take 1,500 mg by mouth.      . carbidopa-levodopa (SINEMET IR) 25-100 MG tablet TAKE 1 & 1/2 (ONE & ONE-HALF) TABLETS BY MOUTH THREE TIMES DAILY 405 tablet 1  . Cholecalciferol 5000 units TABS Take 1 tablet (5,000 Units total) by mouth daily.    . clonazePAM (KLONOPIN) 0.5 MG tablet TAKE ONE-HALF TABLET BY MOUTH ONCE DAILY AT BEDTIME 45 tablet 1  . Denosumab (PROLIA [place]) Inject into the skin every 6 (six) months.    . Elastic Bandages & Supports (ABDOMINAL BINDER/ELASTIC SMALL) MISC 1 Device by Does not apply route daily. 1 each 0  . fluticasone (FLONASE) 50 MCG/ACT nasal spray Place 2 sprays into both nostrils daily. 16 g 1  . Multiple Vitamin (MULTIVITAMIN) capsule 2 tabs po qd     . nitroGLYCERIN (NITROSTAT) 0.3 MG SL tablet Place 1 tablet (0.3 mg total) under the tongue every 5 (five) minutes as needed (max 3 doses). Reported on 04/07/2015 (Patient not taking: Reported on 02/15/2017) 25 tablet 3  . Omega-3 Fatty Acids (  OMEGA 3 PO) 1 tab po qd     . omeprazole (PRILOSEC) 20 MG capsule Take 1 capsule (20 mg total) by mouth daily. 90 capsule 3  . tamoxifen (NOLVADEX) 20 MG tablet TAKE 1 TABLET BY MOUTH ONCE DAILY 30 tablet 2  . traMADol (ULTRAM) 50 MG tablet TAKE ONE TO TWO TABLETS BY MOUTH AT BEDTIME AS NEEDED FOR NECK PAIN 60 tablet 5  . venlafaxine XR (EFFEXOR-XR) 75 MG 24 hr capsule TAKE 1 CAPSULE BY MOUTH ONCE DAILY WITH  BREAKFAST 30 capsule 5  . vitamin B-12 (CYANOCOBALAMIN) 500 MCG tablet Take 2500 mcg daily.     No current facility-administered medications for this visit.     SURGICAL HISTORY:  Past Surgical History:  Procedure Laterality Date  . APPENDECTOMY  2002  . BONE BIOPSY  11/06/10    . BREAST LUMPECTOMY  02/2010   right breast lumpectomy and sentinel node biopsy  . CARDIAC CATHETERIZATION  2005   neg  . CATARACT EXTRACTION, BILATERAL  2018  . CATARACT EXTRACTION, BILATERAL Bilateral 07/2016  . COLONOSCOPY     negative   . HEMORRHOID SURGERY  1970's  . SALPINGOOPHORECTOMY  2002   for benign growths  . SHOULDER SURGERY Right   . TOTAL ABDOMINAL HYSTERECTOMY W/ BILATERAL SALPINGOOPHORECTOMY  77 yrs old   for pain,prolapse ; G3 P2  . UPPER GASTROINTESTINAL ENDOSCOPY      REVIEW OF SYSTEMS:  A comprehensive review of systems was negative except for: Musculoskeletal: positive for arthralgias   PHYSICAL EXAMINATION: General appearance: alert, cooperative and no distress Head: Normocephalic, without obvious abnormality, atraumatic Neck: no adenopathy, no JVD, supple, symmetrical, trachea midline and thyroid not enlarged, symmetric, no tenderness/mass/nodules Lymph nodes: Cervical, supraclavicular, and axillary nodes normal. Resp: clear to auscultation bilaterally Back: symmetric, no curvature. ROM normal. No CVA tenderness. Cardio: regular rate and rhythm, S1, S2 normal, no murmur, click, rub or gallop GI: soft, non-tender; bowel sounds normal; no masses,  no organomegaly Extremities: extremities normal, atraumatic, no cyanosis or edema   ECOG PERFORMANCE STATUS: 1 - Symptomatic but completely ambulatory  Blood pressure 117/64, pulse 90, temperature 99.4 F (37.4 C), temperature source Oral, resp. rate 20, height _0  (1.6 m), weight 128 lb 9.6 oz (58.3 kg), SpO2 100 %.  LABORATORY DATA: Lab Results  Component Value Date   WBC 5.7 04/05/2017   HGB 12.1 04/05/2017   HCT 36.6 04/05/2017   MCV 97.8 04/05/2017   PLT 248 04/05/2017      Chemistry      Component Value Date/Time   NA 142 04/05/2017 1101   NA 141 10/13/2016 1141   K 3.8 04/05/2017 1101   K 3.6 10/13/2016 1141   CL 107 04/05/2017 1101   CL 107 03/21/2012 1501   CO2 27 04/05/2017 1101   CO2  27 10/13/2016 1141   BUN 11 04/05/2017 1101   BUN 20.0 10/13/2016 1141   CREATININE 0.76 04/05/2017 1101   CREATININE 0.8 10/13/2016 1141      Component Value Date/Time   CALCIUM 9.0 04/05/2017 1101   CALCIUM 9.5 10/13/2016 1141   ALKPHOS 46 04/05/2017 1101   ALKPHOS 50 10/13/2016 1141   AST 27 04/05/2017 1101   AST 25 10/13/2016 1141   ALT <6 04/05/2017 1101   ALT 17 10/13/2016 1141   BILITOT 0.4 04/05/2017 1101   BILITOT 0.27 10/13/2016 1141       RADIOGRAPHIC STUDIES: No results found.  ASSESSMENT AND PLAN:  This is a very pleasant 76  years old white female with history of breast adenocarcinoma currently on tamoxifen as well as history of monoclonal gammopathy of undetermined significance currently on observation. The patient continues to do fine with no complaints except for arthralgia.  Her tumor marker for the breast cancer as well as myeloma panel are stable. I recommended for the patient to continue on observation for MGUS and she will continue on tamoxifen for her history of breast cancer. I will see her back for follow-up visit in 6 months for evaluation with repeat myeloma panel and CA 27.29. She was advised to call immediately if she has any concerning symptoms in the interval. The patient voices understanding of current disease status and treatment options and is in agreement with the current care plan.  All questions were answered. The patient knows to call the clinic with any problems, questions or concerns. We can certainly see the patient much sooner if necessary. I spent 10 minutes counseling the patient face to face. The total time spent in the appointment was 15 minutes.  Disclaimer: This note was dictated with voice recognition software. Similar sounding words can inadvertently be transcribed and may not be corrected upon review.

## 2017-05-19 ENCOUNTER — Other Ambulatory Visit: Payer: Self-pay | Admitting: Neurology

## 2017-05-19 NOTE — Telephone Encounter (Signed)
Can you send meds please.

## 2017-06-01 DIAGNOSIS — L821 Other seborrheic keratosis: Secondary | ICD-10-CM | POA: Diagnosis not present

## 2017-06-01 DIAGNOSIS — Z1283 Encounter for screening for malignant neoplasm of skin: Secondary | ICD-10-CM | POA: Diagnosis not present

## 2017-06-02 ENCOUNTER — Other Ambulatory Visit: Payer: Self-pay | Admitting: Internal Medicine

## 2017-06-02 DIAGNOSIS — C50919 Malignant neoplasm of unspecified site of unspecified female breast: Secondary | ICD-10-CM

## 2017-06-22 ENCOUNTER — Other Ambulatory Visit: Payer: Self-pay | Admitting: Neurology

## 2017-07-18 NOTE — Progress Notes (Signed)
[name] was seen today in the movement disorders clinic for neurologic consultation at the request of [name], MD.  The consultation is for the evaluation of tremor.  The records that were made available to me were reviewed.  No one accompanies the patient.   The first symptom(s) the patient noticed was unilateral hand tremor on the L and this was 4-5 months ago.  She is R hand dominant.  She states that it was intermittent when it started but now it is more constant.  No tremor elsewhere.    Only living relative with PD is a cousin, who is currently being treated at [facility].  08/17/13 update:  Pt states that tremor is improved on levodopa but not sure if it is otherwise helping (but may be).  Takes med at 8am/1pm/5-7pm.  No SE.  Blood pressure has been low but states that it is always low.  No lightheadedness.  Balance is not good, but no falls.  She completed therapy.  Is exercising at home.  She walks an hr a day 6 days per week and when she goes to the gym, she will use the treadmill.  Has signed up for PD exercise class.   11/20/13 update:  Pt f/u re: PD.  Is on carbidopa/levodopa 25/100 tid.  Is doing well.  No falls.  No lightheadedness.  No hallucinations.  Exercising faithfully.   Is having an aching pain in the shoulder and in the thumb region on the R side but PD primarily affects her on the L.  Some occasional tremor on the L.    04/10/14 update:  Pt is on carbidopa/levodopa 25/100 tid.  She is stable in regards to PD but unfortunately her husband just died 106 days ago.  She just started back exercising with her sister in law.  No SI/HI.  No falls.  No lightheadeness.  Some tremor but no more than usual.  08/12/14 update:  Pt is on carbidopa/levodopa 25/100 three times per day.  She has done PT/OT/ST since our last visit and just completed her last therapy session on 08/01/14.  She had an injection in her shoulder as well and then had therapy for that and it is feeling better.   No falls since our last visit.  No lightheadedness.  She is exercising faithfully.  She is walking for exercise 6 times a week.  She admits that she is crying a lot and states that it started before her husband died and sometimes it is crying over inconsequential stuff.  Sometimes, she cries over her husbands death but doesn't want medication for that.  She is not SI/HI.  12/16/14 update:  Pt is following up today and is still on carbidopa/levodopa 25/100 tid.   No significant changes since last visit.  She goes to the gym 6 days a week.  Sometimes, she will go two times a day.   She is going to be spending thanksgiving with her son.    Wearing off:  No.  How long before next dose:  n/a Falls:   No. N/V:  No. (had some nausea this past weekend but thinks related to food she ate) Hallucinations:  No.  visual distortions: No. Lightheaded:  Yes.  , just last few days  Syncope: No. Dyskinesia:  No.   04/15/15 update:  The patient presents today for follow-up.  She has a history of Parkinson's disease.  She is currently on carbidopa/levodopa 25/100, 3 times per day.  Overall, she  reports that she is doing well.  She denies any falls.  No hallucinations.  No lightheadedness or near syncope.  She is still exercising.  She started occupational therapy for her shoulder pain.  I saw that speech therapy recommended that she see GI because she had her esophagus structure in the past.  She has an appointment with [name] on March 30.  She states that she is having a little problem swallowing but "it isn't bad yet."  States that she is crying for unknown reason (watching the news, etc).  Doesn't think that it is depression.  09/09/15 update:  The patient follows up today.  I have reviewed multiple records since her last visit.  She remains on carbidopa/levodopa 25/100, one tablet 3 times per day.  Last visit, I was going to start her on Nuedexta, but it turns out that this interfered with her tamoxifen.  We ended  up starting her on Effexor XR, 75 mg daily.  The patient states that the crying spells resolved with the effexor.  She did have a modified barium swallow on 04/21/2015 that was normal.  She continued to have a globus sensation and saw [name] for this.  He felt that she had mild oropharyngeal dysphagia related to Parkinson's, but that was not demonstrated on her barium swallow.  He did do a CT of the abdomen that was negative.  The patient did go to the emergency room on 05/07/2015 with headache.  That has since resolved.  She has also been to Dr. [name] with lightheadedness, and also described vertigo to the physical therapist and they recommended vestibular rehabilitation.  She has been going to rehabilitation therapy.   That did help.  She has not had any falls.  No syncopal episodes.  No falls.  She does tell me she had surgery on 7/18 by [name] for rotator cuff.  She is still in a sling.    10/15/15 update:  The patient follows up today.  She is on carbidopa/levodopa 25/100, one tablet 3 times per day.  She is also on Effexor XR, 75 mg daily.  She feels that mood has been good.  She has significant orthostatic hypotension, but I was leery about giving her medication for this last visit because she was complaining about a lot of chest pain.  Not long after that, she saw [name], who ordered an echocardiogram which was performed on 09/23/2015 and was unremarkable.  Ejection fraction was 55-60%.  She had a nuclear stress test and there was no significant reversible ischemia. LVEF 67% with normal wall motion on the stress Myoview.  She did fall this past Sunday and hit her head.  She was putting on underwear and tripped and her face hit the floor.  Everytime she chews, her L face and jaw hurt.  She also has a bilateral frontal headache.  "It feels like a regular headache like I have had before."   Was told that she couldn't do therapy yesterday until she saw her doctor.  L shoulder is a bit sore.  Admits  that she hasn't been drinking quite as much water.  11/18/15 update:  The patient follows up today.  She remains on carbidopa/levodopa 25/100, one tablet 3 times per day.  She did have a CT of the brain and maxillofacial regions right after our last visit, due to a fall.  Fortunately, there was no intracranial abnormalities and no facial fractures.  She has recovered nicely.  She has had no  further falls.  Re: lightheaded, feeling a "little dizzy" with standing.  Thinks dizziness has not been "bad."  Wears abdominal binder some but makes her "hot."    Her mood is doing good on Effexor XR, 75 mg daily.  "I don't cry any more all of the time."  Going to PT and she is walking for exercise.  Is back to drinking about 5-6 bottles water per day.  She has started to note tremor in the RUE but not as much as in the LUE  02/18/16 update:  Patient follows up today, on carbidopa/levodopa 25/100, one tablet 3 times per day.  Overall, the patient states she has been stable.  She has had several falls since our last visit.  One time, she was vacuuming and moving backward and fell.  Two times, she fell on a curb.  She also fell backing up in the bathroom and she hit the toilet and the bathtub.  She got a "little hurt."  The last time she was dreaming and she fell out of the bed and hit her head on the floor.  That was about a week ago.  Denies LOC/alternation in consciousness.   Last PT for PD at end of July.  Had therapy for shoulder in November after shoulder surgery.   Occasional lightheadedness but no near syncope.  Mood has been good on Effexor XR, 75 mg daily.  05/21/16 update:  Patient seen today in follow-up.  She is on carbidopa/levodopa 25/100, one tablet 3 times per day.  Last visit, we started clonazepam 0.5 mg, half tablet at night for REM behavior disorder.  Reports that this has helped without side effects.  "I can't even tell I take it except I stay in the bed."  She has had no "real" falls since our last  visit.  She does tell me she slid off the bed once when she reached to get her remote control.  She then tells me about a fall backwards but she actually fell backward on the bed.     Her mood remains good on Effexor.  No hallucinations.  No lightheadedness or near syncope.  She is exercising daily at the gym.  She is in PT now.    09/29/16 update:  Patient seen in follow-up for her Parkinson's disease.  I increased her carbidopa/levodopa 25/100 last visit, so that she is now taking 1-1/2 tablets 3 times per day.  She states that she noted no big difference.  She is having no dyskinesia.  She is on clonazepam 0.5 mg, half tablet at night for REM behavior disorder.  This has worked well but she wonders if it causes hangover effects.   Her mood has been good with low dose Effexor, 75 mg daily.  She had cataracts extracted and she is happy with the results.  Dizziness has improved.  Not as much exercise as gym she was using closed.  02/15/17 update: Patient seen in follow-up for Parkinson's disease.  She is on carbidopa/levodopa 25/100, 1-1/2 tablets 3 times per day.  She had a fall in December and hurt her arm.  She was stepping backwards and tripped over something on the floor and fell on the fireplace on the arm.  She is still having pain in the arm that she fell arm. Around the same time, she fell off a friends porch.  She didn't get hurt that time.    No lightheadedness or near syncope.  Drinking water has improved her prior symptom of dizziness.  No hallucinations.  No visual distortions.  She remains on clonazepam 0.5 mg, half a tablet at night for REM behavior disorder.  Mood is good on Effexor, 75 mg daily.  On klonopin for RBD.  Awaiting prior auth and I wrote letter about this and submitted to her insurance company.    States that she has not been acting out the dreams.   I have reviewed records made available to me since last visit.  07/19/17 update: Patient is seen today in follow-up for Parkinson's  disease.  She remains on carbidopa/levodopa 25/100, 1.5 tablets 3 times per day.  She is still on clonazepam for REM behavior disorder (appealed with her insurance for it after last visit). Doing well with that.   She had therapy screens in February, 2019 and was determined that she did not need therapy at that time.  Records have been reviewed since last visit.  She also saw oncology for follow-up for breast cancer.  No treatment changes were made.  She had one fall since last visit.  She was getting wood and was stepping backward and tripped over bricks and fell in the wood pile and hit head.  Legs are feeling weaker/more tremulous.  Also noting abnormal movements of the face and tongue and sometimes has trouble speaking because of it.  Brings video of it.  "I slobber too."    Neuroimaging has not previously been performed.  It was supposed to be done 06/01/13 but pt cancelled as she was claustrophobic and couldn't do the scan even though it was in an open unit.   PREVIOUS MEDICATIONS: none to date  ALLERGIES:   Allergies  Allergen Reactions  . Penicillins Shortness Of Breath and Itching    CURRENT MEDICATIONS:  Current Outpatient Medications on File Prior to Visit  Medication Sig Dispense Refill  . aspirin 81 MG tablet Take 81 mg by mouth daily.      [name] b complex vitamins tablet Take 1 tablet by mouth daily.    . Calcium 1500 MG tablet Take 1,500 mg by mouth.      . Cholecalciferol 5000 units TABS Take 1 tablet (5,000 Units total) by mouth daily.    . clonazePAM (KLONOPIN) 0.5 MG tablet TAKE 1/2 (ONE-HALF) TABLET BY MOUTH AT BEDTIME 15 tablet 5  . Denosumab (PROLIA [place]) Inject into the skin every 6 (six) months.    . Elastic Bandages & Supports (ABDOMINAL BINDER/ELASTIC SMALL) MISC 1 Device by Does not apply route daily. 1 each 0  . fluticasone (FLONASE) 50 MCG/ACT nasal spray Place 2 sprays into both nostrils daily. 16 g 1  . Multiple Vitamin (MULTIVITAMIN) capsule 2 tabs po qd     .  nitroGLYCERIN (NITROSTAT) 0.3 MG SL tablet Place 1 tablet (0.3 mg total) under the tongue every 5 (five) minutes as needed (max 3 doses). Reported on 04/07/2015 (Patient not taking: Reported on 02/15/2017) 25 tablet 3  . Omega-3 Fatty Acids (OMEGA 3 PO) 1 tab po qd     . omeprazole (PRILOSEC) 20 MG capsule Take 1 capsule (20 mg total) by mouth daily. 90 capsule 3  . tamoxifen (NOLVADEX) 20 MG tablet TAKE 1 TABLET BY MOUTH ONCE DAILY 30 tablet 2  . traMADol (ULTRAM) 50 MG tablet TAKE ONE TO TWO TABLETS BY MOUTH AT BEDTIME AS NEEDED FOR NECK PAIN 60 tablet 5  . venlafaxine XR (EFFEXOR-XR) 75 MG 24 hr capsule TAKE 1 CAPSULE BY MOUTH ONCE DAILY WITH BREAKFAST 90 capsule 1  . vitamin B-12 (  CYANOCOBALAMIN) 500 MCG tablet Take 2500 mcg daily.     No current facility-administered medications on file prior to visit.     PAST MEDICAL HISTORY:   Past Medical History:  Diagnosis Date  . Allergy    SEASONAL  . Anemia    Anemia-NOS / PMH, Dr Julien Nordmann  . Arthritis   . Breast cancer ([place])    stage I right breast  . Cataract    BILATERAL  . Decreased vision    R eye  . Esophageal Stricture   . Hyperlipidemia   . Monoclonal gammopathy   . MVP (mitral valve prolapse)   . Osteoporosis   . Parkinson disease ([place])   . Skin cancer    basal cell L neck  . Vertical diplopia     PAST SURGICAL HISTORY:   Past Surgical History:  Procedure Laterality Date  . APPENDECTOMY  2002  . BONE BIOPSY  11/06/10  . BREAST LUMPECTOMY  02/2010   right breast lumpectomy and sentinel node biopsy  . CARDIAC CATHETERIZATION  2005   neg  . CATARACT EXTRACTION, BILATERAL  2018  . CATARACT EXTRACTION, BILATERAL Bilateral 07/2016  . COLONOSCOPY     negative   . HEMORRHOID SURGERY  1970's  . SALPINGOOPHORECTOMY  2002   for benign growths  . SHOULDER SURGERY Right   . TOTAL ABDOMINAL HYSTERECTOMY W/ BILATERAL SALPINGOOPHORECTOMY  77 yrs old   for pain,prolapse ; G3 P2  . UPPER GASTROINTESTINAL ENDOSCOPY       SOCIAL HISTORY:   Social History   Socioeconomic History  . Marital status: Widowed    Spouse name: Not on file  . Number of children: 2  . Years of education: Not on file  . Highest education level: Not on file  Occupational History  . Occupation: Retired  Scientific laboratory technician  . Financial resource strain: Not on file  . Food insecurity:    Worry: Not on file    Inability: Not on file  . Transportation needs:    Medical: Not on file    Non-medical: Not on file  Tobacco Use  . Smoking status: Never Smoker  . Smokeless tobacco: Never Used  Substance and Sexual Activity  . Alcohol use: Yes    Alcohol/week: 0.6 oz    Types: 1 Glasses of wine per week  . Drug use: No  . Sexual activity: Not on file  Lifestyle  . Physical activity:    Days per week: Not on file    Minutes per session: Not on file  . Stress: Not on file  Relationships  . Social connections:    Talks on phone: Not on file    Gets together: Not on file    Attends religious service: Not on file    Active member of club or organization: Not on file    Attends meetings of clubs or organizations: Not on file    Relationship status: Not on file  . Intimate partner violence:    Fear of current or ex partner: Not on file    Emotionally abused: Not on file    Physically abused: Not on file    Forced sexual activity: Not on file  Other Topics Concern  . Not on file  Social History Narrative   Occupation: Textile   Widowed 2016, husband had cancer, was married in 1959    Patient has never smoked.    Illicit Drug Use - no   Patient does not get regular exercise.  Daily Caffeine Use: 1 cup of coffee daily    FAMILY HISTORY:   Family Status  Relation Name Status  . Mother  Deceased       heart disease  . Brother  Alive       diabetes  . Brother  Deceased       heart disease, renal failure  . Father  Deceased       hepatitis  . Sister  Alive       diabetes  . Brother  Deceased       drowned as a  teenager  . Brother  Alive       diabetes  . Brother  Alive       diabetes  . Sister  Alive       diabetes, liver disease (alcohol)  . Sister  Alive       healthy  . Son  Alive       sarcoidosis  . Daughter  Alive       healthy  . Cousin  Alive       Parkinsons disease  . Neg Hx  (Not Specified)    ROS:  A complete 10 system review of systems was obtained and was unremarkable apart from what is mentioned above.  PHYSICAL EXAMINATION:    VITALS:   Vitals:   07/19/17 1013  BP: 122/78  Pulse: 83  SpO2: 97%  Weight: 129 lb (58.5 kg)  Height: 5\' 3"  (1.6 m)    GEN:  The patient appears stated age and is in NAD. HEENT:  Normocephalic, atraumatic.  The mucous membranes are moist. The superficial temporal arteries are without ropiness or tenderness. CV:  RRR Lungs:  CTAB Neck/HEME:  There are no carotid bruits bilaterally.  Neurological examination:  Orientation: The patient is alert and oriented x3. Cranial nerves: There is good facial symmetry. The speech is fluent and clear. Soft palate rises symmetrically and there is no tongue deviation. Hearing is intact to conversational tone. Sensation: Sensation is intact to light touch throughout Motor: Strength is 5/5 in the bilateral upper and lower extremities.   Shoulder shrug is equal and symmetric.  There is no pronator drift.  Movement examination: Tone: There is normal tone in the upper extremities Abnormal movements: There is no tremor.  There is dyskinesia in the legs.   Coordination:  There is decremation with finger taps on the L.  All other RAMs are good.   Gait and Station: The patient has no trouble getting OOC.  Walks well with good arm swing.  ASSESSMENT/PLAN:  1.  Idiopathic Parkinson's disease.  She was dx in May, 2015.    -continue carbidopa/levodopa 25/100, 1.5 tablets tid.  -she is having more bothersome dyskinesia in the face and mouth.  She has it axially as well but doesn't notice that.  Will add  amantadine 100 mg bid.  R/b/se discussed.    -refer to neurorehab for PT  -invited to PD symposium 2.  Depression  -doing well on Effexor XR, 75 mg daily.  No longer with crying spells and feels good.  Now has a new "man" friend and I think that this has helped.  Also has a new 37 week old grandbaby. 4.  Dysphagia  -MBE on 04/21/15 was normal  -[name] did not think this was GI issue  -pt reports that this is resolved on 02/15/17 5.  Orthostatic hypotension  -Drinking more water now and feeling less dizzy  -Talked to her about Northera  but orthostatics today are much better than in the past and worry about creating supine HTN.  Wear compression binder as much as possible, which she is doing.   6.  REM behavior disorder  -doing better with klonopin - 0.5 mg - 1/2 tablet at night.   7.  Sialorrhea  -This is commonly associated with PD.  We talked about treatments.  The patient is not a candidate for oral anticholinergic therapy because of increased risk of confusion and falls.  We discussed Botox (type A and B) and 1% atropine drops.  We discusssed that candy like lemon drops can help by stimulating mm of the oropharynx to induce swallowing.  She is very bothered by it and would like to try botox.  Will authorize 8.  Follow up is anticipated in the next few months, sooner should new neurologic issues arise.  Much greater than 50% of this visit was spent in counseling and coordinating care.  Total face to face time:  25 min

## 2017-07-19 ENCOUNTER — Telehealth: Payer: Self-pay

## 2017-07-19 ENCOUNTER — Other Ambulatory Visit: Payer: Self-pay | Admitting: Neurology

## 2017-07-19 ENCOUNTER — Ambulatory Visit (INDEPENDENT_AMBULATORY_CARE_PROVIDER_SITE_OTHER): Payer: Medicare Other | Admitting: Neurology

## 2017-07-19 ENCOUNTER — Encounter: Payer: Self-pay | Admitting: Neurology

## 2017-07-19 VITALS — BP 122/78 | HR 83 | Ht 63.0 in | Wt 129.0 lb

## 2017-07-19 DIAGNOSIS — G20 Parkinson's disease: Secondary | ICD-10-CM | POA: Diagnosis not present

## 2017-07-19 DIAGNOSIS — G249 Dystonia, unspecified: Secondary | ICD-10-CM | POA: Diagnosis not present

## 2017-07-19 DIAGNOSIS — K117 Disturbances of salivary secretion: Secondary | ICD-10-CM | POA: Diagnosis not present

## 2017-07-19 MED ORDER — AMANTADINE HCL 100 MG PO CAPS: 100.0000 mg | ORAL_CAPSULE | Freq: Two times a day (BID) | ORAL | 5 refills | Status: DC

## 2017-07-19 NOTE — Telephone Encounter (Signed)
Called [facility], spoke with Lauren. Xeomin does not need PA. [employer], selected PA prompt. Message indicated if [facility] does not require PA, then it will not be required for the [facility] supplement.  Called Pt, scheduled her for Friday.

## 2017-07-19 NOTE — Patient Instructions (Signed)
1.  Start amantadine - 100 mg twice per day for the mouth movements. 2.  We will get insurance approval for the botox for the drooling.    Registration is OPEN!    Third Annual Parkinson's Education Symposium   To register: [email or web address]      Search:  [employer] person attending individually Questions: [place], [place] or [email or web address]

## 2017-07-22 ENCOUNTER — Ambulatory Visit (INDEPENDENT_AMBULATORY_CARE_PROVIDER_SITE_OTHER): Payer: Medicare Other | Admitting: Neurology

## 2017-07-22 DIAGNOSIS — K117 Disturbances of salivary secretion: Secondary | ICD-10-CM

## 2017-07-22 MED ORDER — INCOBOTULINUMTOXINA 100 UNITS IM SOLR
100.0000 [IU] | INTRAMUSCULAR | Status: DC
  Administered 2017-07-22: 100 [IU] via INTRAMUSCULAR

## 2017-07-22 NOTE — Procedures (Signed)
Botulinum Clinic   History:  Diagnosis: Sialorrhea associated with PD (icd10: K11.7)    Informed consent was obtained.  Discussed differences between myobloc and xeomin.  The patient was educated on the botulinum toxin the black blox warning and given a copy of the botox patient medication guide.  The patient understands that this warning states that there have been reported cases of the Botox extending beyond the injection site and creating adverse effects, similar to those of botulism. This included loss of strength, trouble walking, hoarseness, trouble saying words clearly, loss of bladder control, trouble breathing, trouble swallowing, diplopia, blurry vision and ptosis. Most of the distant spread of Botox was happening in patients, primarily children, who received medication for spasticity or for cervical dystonia. The patient expressed understanding and desire to proceed.   Injections  Location Left  Right Units Number of sites  Parotid (point 1 on picture) 30 30 60 1 per side  Submandibular (point 2) 20 20 40 1 per side  TOTAL UNITS:   100    Type of Toxin: Xeomin Discarded Units: 0  Needle drawback with each injection was free of blood. Pt tolerated procedure well without complications.   Reinjection is anticipated in 4 months.

## 2017-08-11 DIAGNOSIS — H53001 Unspecified amblyopia, right eye: Secondary | ICD-10-CM | POA: Diagnosis not present

## 2017-08-19 ENCOUNTER — Other Ambulatory Visit: Payer: Self-pay | Admitting: Family Medicine

## 2017-08-19 NOTE — Telephone Encounter (Signed)
Name of Medication: Tramadol Name of Pharmacy: [facility], [place], [place] or Written Date and Quantity:  60 tablet 5 01/21/2017  Last Office Visit and Type: 02/03/17 Acute (Dr. Darnell Level) Next Office Visit and Type: 01/23/18 CPE Last Controlled Substance Agreement Date: None Last UDS: None

## 2017-08-21 NOTE — Telephone Encounter (Signed)
Sent. Thanks.   

## 2017-09-06 ENCOUNTER — Other Ambulatory Visit: Payer: Self-pay | Admitting: Internal Medicine

## 2017-09-06 DIAGNOSIS — C50919 Malignant neoplasm of unspecified site of unspecified female breast: Secondary | ICD-10-CM

## 2017-09-08 DIAGNOSIS — M19011 Primary osteoarthritis, right shoulder: Secondary | ICD-10-CM | POA: Diagnosis not present

## 2017-09-13 ENCOUNTER — Telehealth: Payer: Self-pay | Admitting: Neurology

## 2017-09-13 DIAGNOSIS — R1319 Other dysphagia: Secondary | ICD-10-CM

## 2017-09-13 DIAGNOSIS — G20A1 Parkinson's disease without dyskinesia, without mention of fluctuations: Secondary | ICD-10-CM

## 2017-09-13 DIAGNOSIS — G20 Parkinson's disease: Secondary | ICD-10-CM

## 2017-09-13 NOTE — Telephone Encounter (Signed)
Patient okay with ST and MBE.   We have scheduled you at [facility] for your modified barium swallow on Tuesday 09/20/17 at 11:00 am. Please arrive 15 minutes prior and go to 1st floor radiology. If you need to reschedule for any reason please call [phone].  Patient made aware of appt information above.

## 2017-09-13 NOTE — Telephone Encounter (Signed)
-----   Message from Lake Cavanaugh, DO sent at 09/13/2017  9:33 AM EDT ----- Faythe Ghee if not done ----- Message ----- From: Annamaria Helling, CMA Sent: 09/13/2017   9:04 AM EDT To: Eustace Quail Tat, DO  Dr. Carles Collet - Faythe Ghee to order MBE?   ----- Message ----- From: Sharen Counter, CCC-SLP Sent: 09/13/2017   8:44 AM EDT To: Ludwig Clarks, DO, Annamaria Helling, CMA  Dr. Toney Rakes- This patient had a PD screening earlier in 2019 and ST eval was recommended. Pt often refuses recommendation of ST so I would not be surprised if she refused this recommendation during the screen. A modified barium swallow was also recommended during that screen (pt reported coughing x1/day with meals.  If you agree with these recommendations, please send referral to [place] for ST eval via Epic, and to the appropriate party for the modified barium swallow. Thank you.  Garald Balding, SLP

## 2017-09-16 ENCOUNTER — Other Ambulatory Visit (HOSPITAL_COMMUNITY): Payer: Self-pay | Admitting: Neurology

## 2017-09-16 DIAGNOSIS — R1310 Dysphagia, unspecified: Secondary | ICD-10-CM

## 2017-09-20 ENCOUNTER — Ambulatory Visit (HOSPITAL_COMMUNITY)
Admission: RE | Admit: 2017-09-20 | Discharge: 2017-09-20 | Disposition: A | Discharge status: Home / Self Care | Payer: Medicare Other | Source: Ambulatory Visit | Attending: Neurology | Admitting: Neurology

## 2017-09-20 DIAGNOSIS — R1310 Dysphagia, unspecified: Secondary | ICD-10-CM

## 2017-09-20 DIAGNOSIS — G20 Parkinson's disease: Secondary | ICD-10-CM | POA: Insufficient documentation

## 2017-09-20 DIAGNOSIS — R05 Cough: Secondary | ICD-10-CM | POA: Insufficient documentation

## 2017-09-20 DIAGNOSIS — R1319 Other dysphagia: Secondary | ICD-10-CM

## 2017-09-20 DIAGNOSIS — G20A1 Parkinson's disease without dyskinesia, without mention of fluctuations: Secondary | ICD-10-CM

## 2017-09-23 ENCOUNTER — Encounter: Payer: Self-pay | Admitting: Physical Therapy

## 2017-09-23 ENCOUNTER — Ambulatory Visit: Payer: Medicare Other | Attending: Family Medicine | Admitting: Physical Therapy

## 2017-09-23 DIAGNOSIS — R29818 Other symptoms and signs involving the nervous system: Secondary | ICD-10-CM | POA: Diagnosis not present

## 2017-09-23 DIAGNOSIS — R2689 Other abnormalities of gait and mobility: Secondary | ICD-10-CM | POA: Diagnosis not present

## 2017-09-23 DIAGNOSIS — R293 Abnormal posture: Secondary | ICD-10-CM | POA: Diagnosis not present

## 2017-09-23 DIAGNOSIS — R2681 Unsteadiness on feet: Secondary | ICD-10-CM | POA: Diagnosis not present

## 2017-09-23 NOTE — Therapy (Signed)
[facility] [address] [place], [name], [address] Phone: [phone]   Fax:  [phone]  Physical Therapy Evaluation  Patient Details  Name: [name] MRN: [id number] Date of Birth: [birth year] Referring Provider: Alonza Bogus   Encounter Date: 09/23/2017  PT End of Session - 09/23/17 1654    Visit Number  1    Number of Visits  17    Date for PT Re-Evaluation  12/22/17    Authorization Type  Medicare and BCBS-will need 10th visit progress note    PT Start Time  1021    PT Stop Time  1103    PT Time Calculation (min)  42 min    Activity Tolerance  Patient tolerated treatment well    Behavior During Therapy  [facility] for tasks assessed/performed;Flat affect       Past Medical History:  Diagnosis Date  . Allergy    SEASONAL  . Anemia    Anemia-NOS / PMH, Dr Julien Nordmann  . Arthritis   . Breast cancer ([place])    stage I right breast  . Cataract    BILATERAL  . Decreased vision    R eye  . Esophageal Stricture   . Hyperlipidemia   . Monoclonal gammopathy   . MVP (mitral valve prolapse)   . Osteoporosis   . Parkinson disease ([place])   . Skin cancer    basal cell L neck  . Vertical diplopia     Past Surgical History:  Procedure Laterality Date  . APPENDECTOMY  2002  . BONE BIOPSY  11/06/10  . BREAST LUMPECTOMY  02/2010   right breast lumpectomy and sentinel node biopsy  . CARDIAC CATHETERIZATION  2005   neg  . CATARACT EXTRACTION, BILATERAL  2018  . CATARACT EXTRACTION, BILATERAL Bilateral 07/2016  . COLONOSCOPY     negative   . HEMORRHOID SURGERY  1970's  . SALPINGOOPHORECTOMY  2002   for benign growths  . SHOULDER SURGERY Right   . TOTAL ABDOMINAL HYSTERECTOMY W/ BILATERAL SALPINGOOPHORECTOMY  77 yrs old   for pain,prolapse ; G3 P2  . UPPER GASTROINTESTINAL ENDOSCOPY      There were no vitals filed for this visit.   Subjective Assessment - 09/23/17 1023    Subjective  I still have a lot of  trouble with my R shoulder; everytime I fall, I fall on that shoulder or the back of my head.  She has had 4-6 falls in the past 6 months.  Making a turn and going backwards leads to falls sometimes.  So far, I can get up from falling by myself.      Patient is accompained by:  --   Mittie Bodo   Patient Stated Goals  Pt's goals for therapy are to learn to use a cane.    Currently in Pain?  Yes    Pain Score  1     Pain Location  Back    Pain Orientation  Left    Pain Descriptors / Indicators  Aching    Pain Type  Chronic pain    Pain Frequency  Intermittent    Aggravating Factors   Bending over    Pain Relieving Factors  Hot water in shower, Aleve    Effect of Pain on Daily Activities  PT will monitor pain, but will not address as a goal at this time.         [facility] PT Assessment - 09/23/17 1029      Assessment  Medical Diagnosis  Parkinson's disease    Referring Provider  Tat, Wells Guiles    Onset Date/Surgical Date  07/19/17   MD visit     Precautions   Precautions  Fall      Balance Screen   Has the patient fallen in the past 6 months  Yes    How many times?  4-6    Has the patient had a decrease in activity level because of a fear of falling?   No    Is the patient reluctant to leave their home because of a fear of falling?   No      Home Environment   Living Environment  Private residence    Living Arrangements  Alone    Type of [place] to enter    Entrance Stairs-Number of Steps  7    Entrance Stairs-Rails  Right;Left;Can reach both    [place]  One level      Prior Function   Level of Independence  Independent with household mobility without device;Independent    Leisure  Goes to [employer] treadmill, machines, walks at [employer] (about 5 days per week).        Observation/Other Assessments   Focus on Therapeutic Outcomes (FOTO)   NA      Posture/Postural Control   Posture/Postural Control  Postural limitations    Postural  Limitations  Rounded Shoulders;Forward head      Tone   Assessment Location  Left Lower Extremity      ROM / Strength   AROM / PROM / Strength  Strength      Strength   Overall Strength  Deficits    Strength Assessment Site  Hip;Knee;Ankle    Right/Left Hip  Right;Left    Right Hip Flexion  4/5    Left Hip Flexion  4+/5    Right/Left Knee  Right;Left    Right Knee Flexion  4/5    Right Knee Extension  4/5    Left Knee Flexion  4+/5    Left Knee Extension  4+/5    Right/Left Ankle  Right;Left    Right Ankle Dorsiflexion  4/5    Left Ankle Dorsiflexion  4/5      Transfers   Transfers  Sit to Stand;Stand to Sit    Sit to Stand  6: Modified independent (Device/Increase time);Without upper extremity assist;From chair/3-in-1   strong posterior lean first rep   Five time sit to stand comments   11.63    Stand to Sit  6: Modified independent (Device/Increase time);Without upper extremity assist;To chair/3-in-1      Ambulation/Gait   Ambulation/Gait  Yes    Ambulation/Gait Assistance  5: Supervision;4: Min guard   min guard with turns   Ambulation Distance (Feet)  200 Feet    Assistive device  None    Gait Pattern  Step-through pattern;Decreased arm swing - left;Decreased step length - right;Decreased step length - left;Decreased trunk rotation;Narrow base of support   L foot catches floor at times with gait   Ambulation Surface  Level;Indoor    Gait velocity  9.12 sec = 3.6 ft/sec      Standardized Balance Assessment   Standardized Balance Assessment  Timed Up and Go Test      Timed Up and Go Test   Normal TUG (seconds)  10.94    Manual TUG (seconds)  10.41    Cognitive TUG (seconds)  12.03  TUG Comments  Scores >13.5-15 seconds indicate increased fall risk; pt does experience near-scissoring gait with turns      High Level Balance   High Level Balance Comments  MiniBESTest score:  13/28 (see note for full details).  Of note:  pt has NO balance reaction in posterior,  anterior, or lateral push and release test-would fall if unaided by PT.      LLE Tone   LLE Tone  Mild        Trial of cane x 40 ft, in LUE, needing cues for proper sequence.        Objective measurements completed on examination: See above findings.              PT Education - 09/23/17 1653    Education Details  Educated in fall risk per balance scores (as well as decline in scores since last PT visit); will likely need instruction in assistive device (trialed cane today, but cane may not be enough support)    Person(s) Educated  Patient    Methods  Explanation    Comprehension  Verbalized understanding       PT Short Term Goals - 09/23/17 1703      PT SHORT TERM GOAL #1   Title  Pt will be independent with HEP for improved posture, balance, gait for improved functional mobility.  TARGET 10/21/17    Time  5    Period  Weeks    Status  New    Target Date  10/21/17      PT SHORT TERM GOAL #2   Title  Pt will perform 5x sit<>stand with no posterior LOB, in less than or equal to 11.5 seconds for improved transfer efficiency and safety.    Time  5    Period  Weeks    Status  New    Target Date  10/21/17      PT SHORT TERM GOAL #3   Title  Pt will improve MiniBESTest score to at least 18/28 for decreased fall risk.    Time  5    Period  Weeks    Status  New    Target Date  10/21/17      PT SHORT TERM GOAL #4   Title  Pt will verbalize understanding of fall prevention in home environment.    Time  5    Period  Weeks    Status  New    Target Date  10/21/17      PT SHORT TERM GOAL #5   Title  Pt will ambulate at least 200 ft using appropriate assistive device (cane vs walker) modified independently for improved safety with gait.    Time  5    Period  Weeks    Status  New    Target Date  10/21/17        PT Long Term Goals - 09/23/17 1707      PT LONG TERM GOAL #1   Title  Pt will verbalize plans for ongoing community fitness upon d/c from PT.   TARGET 11/18/17    Time  9    Period  Weeks    Status  New    Target Date  11/18/17      PT LONG TERM GOAL #2   Title  Pt will perform at least 8 of 10 reps of sit<>stand from less than 18 inch surfaces, with no posterior lean for improved transfer efficiency and safety.    Time  9  Period  Weeks    Status  New    Target Date  11/18/17      PT LONG TERM GOAL #3   Title  Pt will improve MiniBESTest score to at least 23/28 for decreased fall risk.    Time  9    Period  Weeks    Status  New    Target Date  11/18/17      PT LONG TERM GOAL #4   Title  Pt will demo <2 steps to recover balance in posterior push and release test for improved balance recovery in posterior direction.    Time  9    Period  Weeks    Status  New    Target Date  11/18/17      PT LONG TERM GOAL #5   Title  Pt will ambulate at least 1000 ft with appropriate assistive device, modified independently for improved community gait negotiation.    Time  9    Period  Weeks    Status  New    Target Date  11/18/17             Plan - 09/23/17 1655    Clinical Impression Statement  Pt is a 77 year old female with history of Parkinson's disease, and 4-6 falls over the past 6 months.  She presents to OP PT with decreased functional strength, posterior lean with transfers, decreased balance, decreased safety and independence with gait, postural instability.  Pt is at fall risk per MiniBESTest score ot 13/28 (which has decreased from 26/28 at last bout of PT), slowed gait velocity from 4 ft/sec to 3.6 ft/sec from last PT screen in Feb 2019.  She would benefit from skilled PT to address posture, balance, transfers, gait training (likely with assistive device) for improved functional moiblity and decreased fall risk.    History and Personal Factors relevant to plan of care:  PMH breast cancer, arthritis, osteoporosis, R shoulder surgery    Clinical Presentation  Evolving    Clinical Presentation due to:  4-6 falls in  past 6 months (reports either falls R or posteriorly), NO postural response on push and release test; at fall risk per MiniBESTest scores    Clinical Decision Making  Moderate    Rehab Potential  Good    Clinical Impairments Affecting Rehab Potential  Motivated for therapy; has current exercise routine    PT Frequency  2x / week   1x/wk for 1 week, then   PT Duration  8 weeks   9 weeks total POC   PT Treatment/Interventions  ADLs/Self Care Home Management;Gait training;Functional mobility training;Therapeutic activities;Therapeutic exercise;DME Instruction;Balance training;Neuromuscular re-education;Patient/family education    PT Next Visit Plan  Gait training with SPC/rollator walker; initiate HEP to address balance recovery strategies    Recommended Other Services  has order for speech therapy (has just had Modified barium swallow)    Consulted and Agree with Plan of Care  Patient       Patient will benefit from skilled therapeutic intervention in order to improve the following deficits and impairments:  Abnormal gait, Decreased balance, Decreased mobility, Decreased strength, Difficulty walking, Postural dysfunction  Visit Diagnosis: Other abnormalities of gait and mobility  Unsteadiness on feet  Abnormal posture  Other symptoms and signs involving the nervous system     Problem List Patient Active Problem List   Diagnosis Date Noted  . Right arm pain 02/03/2017  . Recurrent falls 02/03/2017  . Advance care planning 01/23/2017  .  Hematuria 07/20/2016  . Headache 05/11/2015  . Globus sensation 04/24/2015  . Foot pain 02/25/2015  . Shoulder pain 04/17/2014  . PD (Parkinson's disease) ([place]) 06/05/2013  . Medicare annual wellness visit, subsequent 05/02/2012  . Neck pain 05/02/2012  . Osteoporosis 03/08/2011  . Breast cancer ([place]) 11/19/2010  . GERD (gastroesophageal reflux disease) 08/01/2008  . DYSPHAGIA UNSPECIFIED 08/01/2008  . ORTHOSTATIC HYPOTENSION 06/28/2008  .  HLD (hyperlipidemia) 07/10/2007  . MONOCLONAL GAMMOPATHY 07/10/2007  . ANEMIA-NOS 07/10/2007  . FATIGUE 07/10/2007    Graziella Connery W. 09/23/2017, 5:12 PM  Frazier Butt., PT   [facility] [address] [place], [name], [address] Phone: [phone]   Fax:  [phone]  Name: [name] MRN: [id number] Date of Birth: 1940-11-24

## 2017-09-27 ENCOUNTER — Ambulatory Visit: Payer: Medicare Other | Attending: Family Medicine

## 2017-09-27 DIAGNOSIS — R29898 Other symptoms and signs involving the musculoskeletal system: Secondary | ICD-10-CM | POA: Diagnosis not present

## 2017-09-27 DIAGNOSIS — R29818 Other symptoms and signs involving the nervous system: Secondary | ICD-10-CM | POA: Diagnosis not present

## 2017-09-27 DIAGNOSIS — R2681 Unsteadiness on feet: Secondary | ICD-10-CM | POA: Insufficient documentation

## 2017-09-27 DIAGNOSIS — R2689 Other abnormalities of gait and mobility: Secondary | ICD-10-CM | POA: Insufficient documentation

## 2017-09-27 DIAGNOSIS — R1313 Dysphagia, pharyngeal phase: Secondary | ICD-10-CM | POA: Diagnosis not present

## 2017-09-27 DIAGNOSIS — R293 Abnormal posture: Secondary | ICD-10-CM | POA: Diagnosis not present

## 2017-09-27 DIAGNOSIS — R471 Dysarthria and anarthria: Secondary | ICD-10-CM | POA: Insufficient documentation

## 2017-09-27 NOTE — Therapy (Signed)
[facility] [address] [place], [name], [address] Phone: [phone]   Fax:  [phone]  Speech Language Pathology Evaluation  Patient Details  Name: [name] MRN: [id number] Date of Birth: 04-Apr-1940 Referring Provider: Alonza [name], D.O.   Encounter Date: 09/27/2017  End of Session - 09/27/17 1615    Visit Number  1    Number of Visits  17    Date for SLP Re-Evaluation  12/23/17   90 days   SLP Start Time  1404    SLP Stop Time   1445    SLP Time Calculation (min)  41 min    Activity Tolerance  Patient tolerated treatment well       Past Medical History:  Diagnosis Date  . Allergy    SEASONAL  . Anemia    Anemia-NOS / PMH, Dr [name]  . Arthritis   . Breast cancer ([place])    stage I right breast  . Cataract    BILATERAL  . Decreased vision    R eye  . Esophageal Stricture   . Hyperlipidemia   . Monoclonal gammopathy   . MVP (mitral valve prolapse)   . Osteoporosis   . Parkinson disease ([place])   . Skin cancer    basal cell L neck  . Vertical diplopia     Past Surgical History:  Procedure Laterality Date  . APPENDECTOMY  2002  . BONE BIOPSY  11/06/10  . BREAST LUMPECTOMY  02/2010   right breast lumpectomy and sentinel node biopsy  . CARDIAC CATHETERIZATION  2005   neg  . CATARACT EXTRACTION, BILATERAL  2018  . CATARACT EXTRACTION, BILATERAL Bilateral 07/2016  . COLONOSCOPY     negative   . HEMORRHOID SURGERY  1970's  . SALPINGOOPHORECTOMY  2002   for benign growths  . SHOULDER SURGERY Right   . TOTAL ABDOMINAL HYSTERECTOMY W/ BILATERAL SALPINGOOPHORECTOMY  77 yrs old   for pain,prolapse ; G3 P2  . UPPER GASTROINTESTINAL ENDOSCOPY      There were no vitals filed for this visit.  Subjective Assessment - 09/27/17 1409    Subjective  "[name] I had it last week." (pt, re: MBSS)    Currently in Pain?  No/denies         SLP Evaluation (SPEECH ASSESSMENT) [facility] - 09/27/17 1617      SLP Visit Information   SLP Received On  09/27/17    Referring Provider  [name], [name], D.O.    Onset Date  2015    Medical Diagnosis  Parkinson's disease      Subjective   Patient/Family Stated Goal  Improve loudness in convesation and stay safe when eating      General Information   HPI  77 y.o. female with hx of idiopathic PD (2015 dx). Pt is followed by Dr. Carles [name]. She had ST at this center more than a year ago. She is on carbidopa/levodopa 25/100, 1.5 tablets 3 times per day. She reports more bothersome dyskinesia in the face and mouth, sialorrhea. She underwent botox injection of parotid and submandibular glands 6/28 in effort to control the sialorrhea. Hx of MBS in March 2017 with no unusual findings.  Pt does report recent increase in coughing associated with meals, globus.  She states that her voice is not as loud, and her boyfriend complains that he often cannot hear her.  Pt presents with hypomimia.       Prior Functional Status   Cognitive/Linguistic Baseline  Within functional  limits    Type of Home  Mobile home     Lives With  Alone    Vocation  Retired      Associate Professor   Overall Cognitive Status  Within Functional Limits for tasks assessed      Auditory Comprehension   Overall Auditory Comprehension  Appears within functional limits for tasks assessed      Verbal Expression   Overall Verbal Expression  Appears within functional limits for tasks assessed      Oral Motor/Sensory Function   Overall Oral Motor/Sensory Function  See swallow assessment (BSE)      Motor Speech   Overall Motor Speech  Impaired    Respiration  Impaired    Level of Impairment  Sentence    Phonation  Low vocal intensity    Intelligibility  --   boyfriend complains, per pt, pt is too soft   Effective Techniques  Increased vocal intensity   low 70s dB- focus on louder speech in 60-90 second monolog       Prior Functional Status - 09/27/17 1413      Prior Functional Status    Cognitive/Linguistic Baseline  Within functional limits    Type of Home  Mobile home     Lives With  Alone    Vocation  Retired         SWALLOW ASSESSMENT (BSE) Adult Oral Care Protocol - 09/27/17 1415      Oral Assessment (Complete on admission/transfer/change in patient condition)   Does patient have any of the following "high(er) risk" factors?  None of the above      Oral Motor/Sensory Function - 09/27/17 1415      Oral Motor/Sensory Function   Overall Oral Motor/Sensory Function  Mild impairment    Facial ROM  Reduced left;Reduced right    Facial Symmetry  Within Functional Limits    Lingual ROM  Within Functional Limits    Lingual Symmetry  Within Functional Limits    Lingual Strength  Reduced    Velum  Within Functional Limits       Thin Liquid - 09/27/17 1422      Thin Liquid   Presentation  Cup     No overt s/s aspiration or reported pharyngeal stasis with "hard swallows".    Solid - 09/27/17 1422      Solid (dys III solid - cereal bar)   Presentation  Self Fed     No overt s/s aspiration or reported pharyngeal stasis  with "hard swallows".  SLP reviewed pt's results and recommendations from her modified 09-20-17. (See SLP education)   SLP Education - 09/27/17 1614    Education Details  Swallow HEP, results/recomendations from MBSS last week, swallow precautions    Person(s) Educated  Patient    Methods  Explanation;Demonstration;Verbal cues;Handout    Comprehension  Verbal cues required;Returned demonstration;Verbalized understanding;Need further instruction       SLP Short Term Goals - 09/27/17 1631      SLP SHORT TERM GOAL #1   Title  pt will produce loud /a/ at average mid 80s dB over 3 therapy sessions    Time  4    Period  Weeks    Status  New      SLP SHORT TERM GOAL #2   Title  pt will increase conversational loudness to average low 70s dB average in 5 minutes simple conversation over three sessions    Time  4    Period  Weeks  Status  New      SLP SHORT TERM GOAL #3   Title  pt will improve loudness in 19/20 sentences to average low 70s dB over three sessions    Time  4    Period  Weeks    Status  New      SLP SHORT TERM GOAL #4   Title  pt will perform dysphagia HEP with rare min A over three sessions    Time  4    Period  Weeks    Status  New       SLP Long Term Goals - 09/27/17 1632      SLP LONG TERM GOAL #1   Title  pt will improve loudness in mod complex conversation of 7 minutes average low 70s dB, over 3 sessions    Time  8    Period  Weeks   or 17 visits, for all LTGs   Status  New      SLP LONG TERM GOAL #2   Title  pt will improve loudenss in 7 minutes mod complex-complex conversation over 2 sessions, average 70dB, in mod noisy environment    Time  8    Period  Weeks    Status  New      SLP LONG TERM GOAL #3   Title  pt will improve breath support to adequate, in 7 minutes mod complex/complex conversation    Time  8    Period  Weeks    Status  New      SLP LONG TERM GOAL #4   Title  pt will complete HEP for dysphagia over three sessions    Time  8    Period  Weeks    Status  New      SLP LONG TERM GOAL #5   Title  pt will follow swallow precautions in 3 sessions with POs    Time  8    Period  Weeks    Status  New       Plan - 09/27/17 1617    Clinical Impression Statement  Pt arrives today with mild pharyngeal dysphagia with vallecular and pyriform residue with regular solids, purees, and thin liquids likely due to decr'd pharyngeal constriction. CP bar was confirmed which contributed to backflow (minmal) into pyriforms. Barium stasis intermittently in cervical esophagus (may contribute to globus). HEP for dysphagia was developed today and provided to pt. Pt also demo's mild dysarthria c/b reduced breath support and reduced speech volume (upper 60s/low 70s dB in 5 minutes simple conversation). Pt's loud /a/ was measured at low 80's with min-mod cues for louder speech and  producing "ahh" (in "cot") instead of "a" (as in "cat"). Pt was stimulable for louder, WNL speech loudness when encouraged to incr effort and volume necessary to speak in a monolgue of 60-90 seconds to low 70s dB. Pt would benefit from skilled ST focusing on improved swallow skills as well as incr'd loudness across speaking situations.    Speech Therapy Frequency  2x / week    Duration  --   8 weeks or 17 visits   Treatment/Interventions  Aspiration precaution training;Pharyngeal strengthening exercises;Diet toleration management by SLP;Internal/external aids;Multimodal communcation approach;Patient/family education;Compensatory strategies;Functional tasks;Cueing hierarchy;SLP instruction and feedback;Environmental controls    Potential to Achieve Goals  Good       Patient will benefit from skilled therapeutic intervention in order to improve the following deficits and impairments:   Dysarthria and anarthria  Dysphagia, pharyngeal phase  Problem List Patient Active Problem List   Diagnosis Date Noted  . Right arm pain 02/03/2017  . Recurrent falls 02/03/2017  . Advance care planning 01/23/2017  . Hematuria 07/20/2016  . Headache 05/11/2015  . Globus sensation 04/24/2015  . Foot pain 02/25/2015  . Shoulder pain 04/17/2014  . PD (Parkinson's disease) ([place]) 06/05/2013  . Medicare annual wellness visit, subsequent 05/02/2012  . Neck pain 05/02/2012  . Osteoporosis 03/08/2011  . Breast cancer ([place]) 11/19/2010  . GERD (gastroesophageal reflux disease) 08/01/2008  . DYSPHAGIA UNSPECIFIED 08/01/2008  . ORTHOSTATIC HYPOTENSION 06/28/2008  . HLD (hyperlipidemia) 07/10/2007  . MONOCLONAL GAMMOPATHY 07/10/2007  . ANEMIA-NOS 07/10/2007  . FATIGUE 07/10/2007    SCHINKE,CARL ,MS, CCC-SLP  09/27/2017, 4:53 PM  [facility] [address] [place], [name], [address] Phone: [phone]   Fax:  [phone]  Name: MATSUE STROM MRN: [id number] Date of Birth: 11-22-1940

## 2017-09-27 NOTE — Patient Instructions (Signed)
Swallow harder with everything you eat.  SWALLOWING EXERCISES Do these 6 of the 7 days per week until Halloween, then twice a week after that  1. Effortful Swallows - Press your tongue against the roof of your mouth for 3 seconds, then squeeze the muscles in your neck while you swallow your saliva or a sip of water - Repeat 20 times, 2-3 times a day, and use whenever you eat or drink  Gauze exercise  - Stick tongue out past your teeth and gently hold it with the gauze - Pull your tongue back in your mouth but keep it held tight with the gauze - Repeat 20 times, 2-3 times a day  2. Shaker Exercise - head lift - Lie flat on your back in your bed or on a couch without pillows - Raise your head and look at your feet - KEEP YOUR SHOULDERS DOWN - HOLD FOR 45-60 SECONDS, then lower your head back down - Repeat 3 times, 2-3 times a day  3. Mendelsohn Maneuver - "half swallow" exercise - Start to swallow, and keep your Adam's apple up by squeezing hard with the            muscles of the throat - Hold the squeeze for 5-7 seconds and then relax - Repeat 20 times, 2-3 times a day *use a wet spoon if your mouth gets dry*   Breath Hold - Say "HUH!" loudly, then hold your breath for 3 seconds at your voice box - Repeat 20 times, 2-3 times a day  Chin tuck  Put your thumb and fist under your chin and tuck your chin down  Hold it tight with your thumb and fist for 3 seconds!   Repeat 10 times, 2-3 times a day

## 2017-10-03 ENCOUNTER — Ambulatory Visit: Payer: Self-pay

## 2017-10-03 ENCOUNTER — Ambulatory Visit: Payer: Medicare Other | Admitting: Physical Therapy

## 2017-10-03 ENCOUNTER — Ambulatory Visit (INDEPENDENT_AMBULATORY_CARE_PROVIDER_SITE_OTHER): Payer: Medicare Other | Admitting: Family Medicine

## 2017-10-03 ENCOUNTER — Encounter: Payer: Self-pay | Admitting: Family Medicine

## 2017-10-03 ENCOUNTER — Encounter: Payer: Self-pay | Admitting: Physical Therapy

## 2017-10-03 VITALS — BP 136/70 | HR 80 | Temp 98.5°F | Ht 63.0 in | Wt 124.8 lb

## 2017-10-03 DIAGNOSIS — R29818 Other symptoms and signs involving the nervous system: Secondary | ICD-10-CM | POA: Diagnosis not present

## 2017-10-03 DIAGNOSIS — R2681 Unsteadiness on feet: Secondary | ICD-10-CM | POA: Diagnosis not present

## 2017-10-03 DIAGNOSIS — R600 Localized edema: Secondary | ICD-10-CM | POA: Insufficient documentation

## 2017-10-03 DIAGNOSIS — R1313 Dysphagia, pharyngeal phase: Secondary | ICD-10-CM | POA: Diagnosis not present

## 2017-10-03 DIAGNOSIS — R29898 Other symptoms and signs involving the musculoskeletal system: Secondary | ICD-10-CM | POA: Diagnosis not present

## 2017-10-03 DIAGNOSIS — G20 Parkinson's disease: Secondary | ICD-10-CM

## 2017-10-03 DIAGNOSIS — R2689 Other abnormalities of gait and mobility: Secondary | ICD-10-CM | POA: Diagnosis not present

## 2017-10-03 DIAGNOSIS — R471 Dysarthria and anarthria: Secondary | ICD-10-CM | POA: Diagnosis not present

## 2017-10-03 DIAGNOSIS — Z23 Encounter for immunization: Secondary | ICD-10-CM

## 2017-10-03 LAB — POC URINALSYSI DIPSTICK (AUTOMATED)
Bilirubin, UA: NEGATIVE
Blood, UA: NEGATIVE
Glucose, UA: NEGATIVE
Ketones, UA: NEGATIVE
Leukocytes, UA: NEGATIVE
Nitrite, UA: NEGATIVE
Protein, UA: NEGATIVE
Spec Grav, UA: 1.01 (ref 1.010–1.025)
Urobilinogen, UA: 0.2 E.U./dL
pH, UA: 8 (ref 5.0–8.0)

## 2017-10-03 NOTE — Progress Notes (Signed)
BP 136/70 (BP Location: Left Arm, Patient Position: Sitting, Cuff Size: Normal)   Pulse 80   Temp 98.5 F (36.9 C) (Oral)   Ht 5\' 3"  (1.6 m)   Wt 124 lb 12 oz (56.6 kg)   SpO2 98%   BMI 22.10 kg/m    CC: leg swelling Subjective:    Patient ID: [name], female    DOB: 12/04/1940, 77 y.o.   MRN: [id number]  HPI: [name] is a 77 y.o. female presenting on 10/03/2017 for Leg Swelling (C/o bilateral lower LEs swelling. Noticed on 09/29/17. Denies any pain. )   Known PD followed by Dr Tat. She is on amantadine, sinemet IR, prolia and effexor.   First noticed bilateral leg and foot swelling 5 days ago, L>R. No pain or redness or warmth. Denies inciting trauma or injury. She has recurrent falls, last 2 wks ago (going to fall prevention therapy). Denies change in the diet - tries to avoid salt in diet. No chest pain, dyspnea, or new joint pains. No new medicines  ...come to think of it, she has started eating out more as she's started dating a gentleman who likes to eat out.   Relevant past medical, surgical, family and social history reviewed and updated as indicated. Interim medical history since our last visit reviewed. Allergies and medications reviewed and updated. Outpatient Medications Prior to Visit  Medication Sig Dispense Refill  . amantadine (SYMMETREL) 100 MG capsule Take 1 capsule (100 mg total) by mouth 2 (two) times daily. 60 capsule 5  . aspirin 81 MG tablet Take 81 mg by mouth daily.      [name] b complex vitamins tablet Take 1 tablet by mouth daily.    . Calcium 1500 MG tablet Take 1,500 mg by mouth.      . carbidopa-levodopa (SINEMET IR) 25-100 MG tablet TAKE 1 & 1/2 (ONE & ONE-HALF) TABLETS BY MOUTH THREE TIMES DAILY 405 tablet 1  . Cholecalciferol 5000 units TABS Take 1 tablet (5,000 Units total) by mouth daily.    . clonazePAM (KLONOPIN) 0.5 MG tablet TAKE 1/2 (ONE-HALF) TABLET BY MOUTH AT BEDTIME 15 tablet 5  . Denosumab (PROLIA ) Inject into the skin  every 6 (six) months.    . Elastic Bandages & Supports (ABDOMINAL BINDER/ELASTIC SMALL) MISC 1 Device by Does not apply route daily. 1 each 0  . fluticasone (FLONASE) 50 MCG/ACT nasal spray Place 2 sprays into both nostrils daily. 16 g 1  . Multiple Vitamin (MULTIVITAMIN) capsule 2 tabs po qd     . nitroGLYCERIN (NITROSTAT) 0.3 MG SL tablet Place 1 tablet (0.3 mg total) under the tongue every 5 (five) minutes as needed (max 3 doses). Reported on 04/07/2015 25 tablet 3  . Omega-3 Fatty Acids (OMEGA 3 PO) 1 tab po qd     . omeprazole (PRILOSEC) 20 MG capsule Take 1 capsule (20 mg total) by mouth daily. 90 capsule 3  . tamoxifen (NOLVADEX) 20 MG tablet TAKE 1 TABLET BY MOUTH ONCE DAILY 30 tablet 2  . traMADol (ULTRAM) 50 MG tablet TAKE 1 TO 2 TABLETS BY MOUTH AT BEDTIME AS NEEDED FOR PAIN 60 tablet 5  . venlafaxine XR (EFFEXOR-XR) 75 MG 24 hr capsule TAKE 1 CAPSULE BY MOUTH ONCE DAILY WITH BREAKFAST 90 capsule 1  . vitamin B-12 (CYANOCOBALAMIN) 500 MCG tablet Take 2500 mcg daily.     Facility-Administered Medications Prior to Visit  Medication Dose Route Frequency Provider Last Rate Last Dose  . incobotulinumtoxinA (  XEOMIN) 100 units injection 100 Units  100 Units Intramuscular Q90 days Tat, Eustace Quail, DO   100 Units at 07/22/17 1122     Per HPI unless specifically indicated in ROS section below Review of Systems     Objective:    BP 136/70 (BP Location: Left Arm, Patient Position: Sitting, Cuff Size: Normal)   Pulse 80   Temp 98.5 F (36.9 C) (Oral)   Ht 5\' 3"  (1.6 m)   Wt 124 lb 12 oz (56.6 kg)   SpO2 98%   BMI 22.10 kg/m   Wt Readings from Last 3 Encounters:  10/03/17 124 lb 12 oz (56.6 kg)  07/19/17 129 lb (58.5 kg)  04/18/17 128 lb 9.6 oz (58.3 kg)    Physical Exam  Constitutional: She appears well-developed and well-nourished. No distress.  HENT:  Mouth/Throat: Oropharynx is clear and moist. No oropharyngeal exudate.  Cardiovascular: Normal rate, regular rhythm and normal  heart sounds.  No murmur heard. Pulmonary/Chest: Effort normal and breath sounds normal. No respiratory distress. She has no wheezes. She has no rales.  Abdominal: Soft. Bowel sounds are normal. She exhibits no distension and no mass. There is no tenderness. There is no rebound and no guarding. No hernia.  Musculoskeletal: Normal range of motion. She exhibits edema (tr pedal edema bilaterally from dorsal feet to knees).  2+ DP  Skin: Skin is warm and dry. No erythema.  Psychiatric: She has a normal mood and affect.  Nursing note and vitals reviewed.  Results for orders placed or performed in visit on 10/03/17  POCT Urinalysis Dipstick (Automated)  Result Value Ref Range   Color, UA yellow    Clarity, UA clear    Glucose, UA Negative Negative   Bilirubin, UA negative    Ketones, UA negative    Spec Grav, UA 1.010 1.010 - 1.025   Blood, UA negative    pH, UA 8.0 5.0 - 8.0   Protein, UA Negative Negative   Urobilinogen, UA 0.2 0.2 or 1.0 E.U./dL   Nitrite, UA negative    Leukocytes, UA Negative Negative      Assessment & Plan:   Problem List Items Addressed This Visit    Pedal edema - Primary    Worsening, in setting of recently eating out more (dating new friend). Anticipate related to increased sodium in diet. rec limit sodium, drink water, elevate legs. If no improvement with this, or any worsening, I asked her to let us know to come in for labwork (CBC, CMP, TSH, consider BNP if further symptoms develop) Check UA today.  Pt agrees with plan.       Relevant Orders   POCT Urinalysis Dipstick (Automated) (Completed)   PD (Parkinson's disease) ([place])    Other Visit Diagnoses    Need for influenza vaccination       Relevant Orders   Flu Vaccine QUAD 36+ mos IM (Completed)       No orders of the defined types were placed in this encounter.  Orders Placed This Encounter  Procedures  . Flu Vaccine QUAD 36+ mos IM  . POCT Urinalysis Dipstick (Automated)    Follow up  plan: Return if symptoms worsen or fail to improve.  Ria Bush, MD

## 2017-10-03 NOTE — Patient Instructions (Addendum)
  Have someone help you get the rollator out of the building and see if the handles lower enough to fit you.  Have someone with you as you practice folding the walker and possibly try putting it in/out of the car behind the driver's seat.    Feet Together, Head Motion - Eyes Closed    Stand with your back to the corner, but not touching the walls. Place a chair backwards in front of you. Start with feet together and close eyes with head still x 30 seconds. Repeat x 3. When you are able to hold for 30 seconds without touching the walls or the chair, begin to move head slowly, up and down x10 and left and right x 10.  Do __1__ sessions per day.

## 2017-10-03 NOTE — Telephone Encounter (Signed)
Pt has appt with Dr Darnell Level 10/03/17 at 2:45.

## 2017-10-03 NOTE — Therapy (Signed)
[facility] [address] [place], [name], [address] Phone: [phone]   Fax:  [phone]  Physical Therapy Treatment  Patient Details  Name: [name] MRN: [id number] Date of Birth: 27-Jul-1940 Referring Provider: Alonza Bogus   Encounter Date: 10/03/2017  PT End of Session - 10/03/17 0904    Visit Number  2    Number of Visits  17    Date for PT Re-Evaluation  12/22/17    Authorization Type  Medicare and BCBS-will need 10th visit progress note    PT Start Time  0849    PT Stop Time  0931    PT Time Calculation (min)  42 min    Equipment Utilized During Treatment  Gait belt    Activity Tolerance  Patient tolerated treatment well    Behavior During Therapy  [facility] for tasks assessed/performed       Past Medical History:  Diagnosis Date  . Allergy    SEASONAL  . Anemia    Anemia-NOS / PMH, Dr Julien Nordmann  . Arthritis   . Breast cancer ([place])    stage I right breast  . Cataract    BILATERAL  . Decreased vision    R eye  . Esophageal Stricture   . Hyperlipidemia   . Monoclonal gammopathy   . MVP (mitral valve prolapse)   . Osteoporosis   . Parkinson disease ([place])   . Skin cancer    basal cell L neck  . Vertical diplopia     Past Surgical History:  Procedure Laterality Date  . APPENDECTOMY  2002  . BONE BIOPSY  11/06/10  . BREAST LUMPECTOMY  02/2010   right breast lumpectomy and sentinel node biopsy  . CARDIAC CATHETERIZATION  2005   neg  . CATARACT EXTRACTION, BILATERAL  2018  . CATARACT EXTRACTION, BILATERAL Bilateral 07/2016  . COLONOSCOPY     negative   . HEMORRHOID SURGERY  1970's  . SALPINGOOPHORECTOMY  2002   for benign growths  . SHOULDER SURGERY Right   . TOTAL ABDOMINAL HYSTERECTOMY W/ BILATERAL SALPINGOOPHORECTOMY  77 yrs old   for pain,prolapse ; G3 P2  . UPPER GASTROINTESTINAL ENDOSCOPY      There were no vitals filed for this visit.  Subjective Assessment - 10/03/17 0851     Subjective  Has been having swelling in both legs and feet x 4 days. Called MD this moring and going to be seen today by MD. She reports this is a new problem.     Patient is accompained by:  --    Patient Stated Goals  Pt's goals for therapy are to learn to use a cane.    Currently in Pain?  Yes    Pain Score  4     Pain Location  Shoulder    Pain Orientation  Right    Pain Descriptors / Indicators  Aching    Pain Type  Chronic pain    Pain Radiating Towards  none    Pain Onset  More than a month ago    Pain Frequency  Intermittent    Aggravating Factors   using RUE    Pain Relieving Factors  resting RUE    Effect of Pain on Daily Activities  PT will monitor pain, but will not address as a goal at this time                       [facility] Adult PT Treatment/Exercise -  10/03/17 1931      Transfers   Transfers  Sit to Stand;Stand to Sit    Sit to Stand  6: Modified independent (Device/Increase time)      Ambulation/Gait   Ambulation/Gait Assistance  5: Supervision;4: Min assist    Ambulation/Gait Assistance Details  no device with LOB/stumble and required min assist; with SPC minguard and vc for sequencing & placement of cane (pt tends to place too close towards midline; with rollator with vc for tight turns, proper use during transfers    Ambulation Distance (Feet)  120 Feet   x 4   Assistive device  Rolling walker;Rollator;Straight cane;None    Gait Pattern  Step-through pattern;Decreased arm swing - left;Decreased step length - right;Decreased step length - left;Decreased trunk rotation;Narrow base of support    Ambulation Surface  Level;Indoor          Balance Exercises - 10/03/17 1937      Balance Exercises: Standing   Standing Eyes Opened  Narrow base of support (BOS);Wide (BOA);Head turns;Solid surface    Standing Eyes Closed  Wide (BOA);Head turns;Solid surface        PT Education - 10/03/17 1939    Education Details  additions to HEP; safe, proper  use of RW vs rollator vs SPC    Person(s) Educated  Patient    Methods  Explanation;Demonstration;Verbal cues;Handout    Comprehension  Verbalized understanding;Returned demonstration;Verbal cues required;Need further instruction       PT Short Term Goals - 09/23/17 1703      PT SHORT TERM GOAL #1   Title  Pt will be independent with HEP for improved posture, balance, gait for improved functional mobility.  TARGET 10/21/17    Time  5    Period  Weeks    Status  New    Target Date  10/21/17      PT SHORT TERM GOAL #2   Title  Pt will perform 5x sit<>stand with no posterior LOB, in less than or equal to 11.5 seconds for improved transfer efficiency and safety.    Time  5    Period  Weeks    Status  New    Target Date  10/21/17      PT SHORT TERM GOAL #3   Title  Pt will improve MiniBESTest score to at least 18/28 for decreased fall risk.    Time  5    Period  Weeks    Status  New    Target Date  10/21/17      PT SHORT TERM GOAL #4   Title  Pt will verbalize understanding of fall prevention in home environment.    Time  5    Period  Weeks    Status  New    Target Date  10/21/17      PT SHORT TERM GOAL #5   Title  Pt will ambulate at least 200 ft using appropriate assistive device (cane vs walker) modified independently for improved safety with gait.    Time  5    Period  Weeks    Status  New    Target Date  10/21/17        PT Long Term Goals - 09/23/17 1707      PT LONG TERM GOAL #1   Title  Pt will verbalize plans for ongoing community fitness upon d/c from PT.  TARGET 11/18/17    Time  9    Period  Weeks    Status  New  Target Date  11/18/17      PT LONG TERM GOAL #2   Title  Pt will perform at least 8 of 10 reps of sit<>stand from less than 18 inch surfaces, with no posterior lean for improved transfer efficiency and safety.    Time  9    Period  Weeks    Status  New    Target Date  11/18/17      PT LONG TERM GOAL #3   Title  Pt will improve  MiniBESTest score to at least 23/28 for decreased fall risk.    Time  9    Period  Weeks    Status  New    Target Date  11/18/17      PT LONG TERM GOAL #4   Title  Pt will demo <2 steps to recover balance in posterior push and release test for improved balance recovery in posterior direction.    Time  9    Period  Weeks    Status  New    Target Date  11/18/17      PT LONG TERM GOAL #5   Title  Pt will ambulate at least 1000 ft with appropriate assistive device, modified independently for improved community gait negotiation.    Time  9    Period  Weeks    Status  New    Target Date  11/18/17            Plan - 10/03/17 1941    Clinical Impression Statement  Patient open to the idea of using an assistive device in light of her high risk of falling, although she would prefer not to. She only has access to a rollator as it was her husband's, however was hoping to be able to get enough support from a cane. After trying multiple devices, she agreed the rollator improved her walking and safety the most. She does still drive and will need to practice loading the rollator into her car. She states her female friend can assist her.     Rehab Potential  Good    Clinical Impairments Affecting Rehab Potential  Motivated for therapy; has current exercise routine    PT Frequency  2x / week   1x/wk for 1 week, then   PT Duration  8 weeks   9 weeks total POC   PT Treatment/Interventions  ADLs/Self Care Home Management;Gait training;Functional mobility training;Therapeutic activities;Therapeutic exercise;DME Instruction;Balance training;Neuromuscular re-education;Patient/family education    PT Next Visit Plan  Gait training with rollator; ? go out and practice putting rollator into her car; continue HEP to address balance recovery strategies    Consulted and Agree with Plan of Care  Patient       Patient will benefit from skilled therapeutic intervention in order to improve the following deficits  and impairments:  Abnormal gait, Decreased balance, Decreased mobility, Decreased strength, Difficulty walking, Postural dysfunction  Visit Diagnosis: Other abnormalities of gait and mobility  Unsteadiness on feet  Other symptoms and signs involving the nervous system     Problem List Patient Active Problem List   Diagnosis Date Noted  . Pedal edema 10/03/2017  . Right arm pain 02/03/2017  . Recurrent falls 02/03/2017  . Advance care planning 01/23/2017  . Hematuria 07/20/2016  . Headache 05/11/2015  . Globus sensation 04/24/2015  . Foot pain 02/25/2015  . Shoulder pain 04/17/2014  . PD (Parkinson's disease) ([place]) 06/05/2013  . Medicare annual wellness visit, subsequent 05/02/2012  . Neck pain 05/02/2012  .  Osteoporosis 03/08/2011  . Breast cancer ([place]) 11/19/2010  . GERD (gastroesophageal reflux disease) 08/01/2008  . DYSPHAGIA UNSPECIFIED 08/01/2008  . ORTHOSTATIC HYPOTENSION 06/28/2008  . HLD (hyperlipidemia) 07/10/2007  . MONOCLONAL GAMMOPATHY 07/10/2007  . ANEMIA-NOS 07/10/2007  . FATIGUE 07/10/2007    Rexanne Mano, PT 10/03/2017, 7:50 PM  [facility] [address] [place], [name], [address] Phone: [phone]   Fax:  [phone]  Name: [name] MRN: [id number] Date of Birth: [birth year]

## 2017-10-03 NOTE — Assessment & Plan Note (Signed)
Worsening, in setting of recently eating out more (dating new friend). Anticipate related to increased sodium in diet. rec limit sodium, drink water, elevate legs. If no improvement with this, or any worsening, I asked her to let us know to come in for labwork (CBC, CMP, TSH, consider BNP if further symptoms develop) Check UA today.  Pt agrees with plan.

## 2017-10-03 NOTE — Telephone Encounter (Signed)
Pt. Reports last Thursday noticed swelling bilaterally to feet and both lower legs. Swelling goes up to her calf. No redness or broken skin areas. No chest pain or shortness breath. Does not get better with elevating legs.Appointment made for today as requested.Instructed to go to ED if she develops chest pain or shortness of breath. Verbalizes understanding.  Reason for Disposition . [1] MODERATE leg swelling (e.g., swelling extends up to knees) AND [2] new onset or worsening  Answer Assessment - Initial Assessment Questions 1. ONSET: "When did the swelling start?" (e.g., minutes, hours, days)     Last Thursday 2. LOCATION: "What part of the leg is swollen?"  "Are both legs swollen or just one leg?"     Both feet and legs up to the calf 3. SEVERITY: "How bad is the swelling?" (e.g., localized; mild, moderate, severe)  - Localized - small area of swelling localized to one leg  - MILD pedal edema - swelling limited to foot and ankle, pitting edema < 1/4 inch (6 mm) deep, rest and elevation eliminate most or all swelling  - MODERATE edema - swelling of lower leg to knee, pitting edema > 1/4 inch (6 mm) deep, rest and elevation only partially reduce swelling  - SEVERE edema - swelling extends above knee, facial or hand swelling present      Mild to moderate 4. REDNESS: "Does the swelling look red or infected?"     No 5. PAIN: "Is the swelling painful to touch?" If so, ask: "How painful is it?"   (Scale 1-10; mild, moderate or severe)     No 6. FEVER: "Do you have a fever?" If so, ask: "What is it, how was it measured, and when did it start?"      No 7. CAUSE: "What do you think is causing the leg swelling?"     Unsure 8. MEDICAL HISTORY: "Do you have a history of heart failure, kidney disease, liver failure, or cancer?"     No 9. RECURRENT SYMPTOM: "Have you had leg swelling before?" If so, ask: "When was the last time?" "What happened that time?"     Yes - but it would go away 10. OTHER  SYMPTOMS: "Do you have any other symptoms?" (e.g., chest pain, difficulty breathing)       No 11. PREGNANCY: "Is there any chance you are pregnant?" "When was your last menstrual period?"       No  Protocols used: LEG SWELLING AND EDEMA-A-AH

## 2017-10-03 NOTE — Patient Instructions (Signed)
Flu shot today Urinalysis today. I think leg swelling is coming from increased sodium in the diet from eating out more. Back off salt/sodium, elevate legs, drink plenty of fluids. if no better with this over next 2 weeks, call us for blood work. Let us know sooner if any worsening.  Edema Edema is when you have too much fluid in your body or under your skin. Edema may make your legs, feet, and ankles swell up. Swelling is also common in looser tissues, like around your eyes. This is a common condition. It gets more common as you get older. There are many possible causes of edema. Eating too much salt (sodium) and being on your feet or sitting for a long time can cause edema in your legs, feet, and ankles. Hot weather may make edema worse. Edema is usually painless. Your skin may look swollen or shiny. Follow these instructions at home:  Keep the swollen body part raised (elevated) above the level of your heart when you are sitting or lying down.  Do not sit still or stand for a long time.  Do not wear tight clothes. Do not wear garters on your upper legs.  Exercise your legs. This can help the swelling go down.  Wear elastic bandages or support stockings as told by your doctor.  Eat a low-salt (low-sodium) diet to reduce fluid as told by your doctor.  Depending on the cause of your swelling, you may need to limit how much fluid you drink (fluid restriction).  Take over-the-counter and prescription medicines only as told by your doctor. Contact a doctor if:  Treatment is not working.  You have heart, liver, or kidney disease and have symptoms of edema.  You have sudden and unexplained weight gain. Get help right away if:  You have shortness of breath or chest pain.  You cannot breathe when you lie down.  You have pain, redness, or warmth in the swollen areas.  You have heart, liver, or kidney disease and get edema all of a sudden.  You have a fever and your symptoms get worse  all of a sudden. Summary  Edema is when you have too much fluid in your body or under your skin.  Edema may make your legs, feet, and ankles swell up. Swelling is also common in looser tissues, like around your eyes.  Raise (elevate) the swollen body part above the level of your heart when you are sitting or lying down.  Follow your doctor's instructions about diet and how much fluid you can drink (fluid restriction). This information is not intended to replace advice given to you by your health care provider. Make sure you discuss any questions you have with your health care provider. Document Released: 06/30/2007 Document Revised: 01/30/2016 Document Reviewed: 01/30/2016 Elsevier Interactive Patient Education  2017 [employer].

## 2017-10-10 ENCOUNTER — Encounter: Payer: Self-pay | Admitting: Physical Therapy

## 2017-10-10 ENCOUNTER — Ambulatory Visit: Payer: Medicare Other | Admitting: Physical Therapy

## 2017-10-10 DIAGNOSIS — R29898 Other symptoms and signs involving the musculoskeletal system: Secondary | ICD-10-CM | POA: Diagnosis not present

## 2017-10-10 DIAGNOSIS — R293 Abnormal posture: Secondary | ICD-10-CM

## 2017-10-10 DIAGNOSIS — R2681 Unsteadiness on feet: Secondary | ICD-10-CM

## 2017-10-10 DIAGNOSIS — R2689 Other abnormalities of gait and mobility: Secondary | ICD-10-CM | POA: Diagnosis not present

## 2017-10-10 DIAGNOSIS — R29818 Other symptoms and signs involving the nervous system: Secondary | ICD-10-CM | POA: Diagnosis not present

## 2017-10-10 DIAGNOSIS — R471 Dysarthria and anarthria: Secondary | ICD-10-CM | POA: Diagnosis not present

## 2017-10-10 DIAGNOSIS — R1313 Dysphagia, pharyngeal phase: Secondary | ICD-10-CM | POA: Diagnosis not present

## 2017-10-10 NOTE — Therapy (Signed)
[facility] [address] [place], [name], [address] Phone: [phone]   Fax:  [phone]  Physical Therapy Treatment  Patient Details  Name: [name] MRN: [id number] Date of Birth: [birth year] Referring Provider: Alonza Bogus   Encounter Date: 10/10/2017  PT End of Session - 10/10/17 1249    Visit Number  3    Number of Visits  17    Date for PT Re-Evaluation  12/22/17    Authorization Type  Medicare and BCBS-will need 10th visit progress note    PT Start Time  0850    PT Stop Time  0935    PT Time Calculation (min)  45 min    Equipment Utilized During Treatment  Gait belt    Activity Tolerance  Patient tolerated treatment well    Behavior During Therapy  [facility] for tasks assessed/performed       Past Medical History:  Diagnosis Date  . Allergy    SEASONAL  . Anemia    Anemia-NOS / PMH, Dr Julien Nordmann  . Arthritis   . Breast cancer ([place])    stage I right breast  . Cataract    BILATERAL  . Decreased vision    R eye  . Esophageal Stricture   . Hyperlipidemia   . Monoclonal gammopathy   . MVP (mitral valve prolapse)   . Osteoporosis   . Parkinson disease ([place])   . Skin cancer    basal cell L neck  . Vertical diplopia     Past Surgical History:  Procedure Laterality Date  . APPENDECTOMY  2002  . BONE BIOPSY  11/06/10  . BREAST LUMPECTOMY  02/2010   right breast lumpectomy and sentinel node biopsy  . CARDIAC CATHETERIZATION  2005   neg  . CATARACT EXTRACTION, BILATERAL  2018  . CATARACT EXTRACTION, BILATERAL Bilateral 07/2016  . COLONOSCOPY     negative   . HEMORRHOID SURGERY  1970's  . SALPINGOOPHORECTOMY  2002   for benign growths  . SHOULDER SURGERY Right   . TOTAL ABDOMINAL HYSTERECTOMY W/ BILATERAL SALPINGOOPHORECTOMY  77 yrs old   for pain,prolapse ; G3 P2  . UPPER GASTROINTESTINAL ENDOSCOPY      There were no vitals filed for this visit.  Subjective Assessment - 10/10/17 0848     Subjective  Pt went to MD about LE swelling. Per pt, Dr stated to give it 2 weeks and if swelling is still a problem check in with regular PCP; heart and urinanalysis checked out fine. Pt would prefer to use a cane (and has a cane that she plans to get out of storage) in the house due to it  being small.  Reports losing balance backwards or with turns.    Patient Stated Goals  Pt's goals for therapy are to learn to use a cane.    Currently in Pain?  Yes    Pain Score  2     Pain Location  Foot    Pain Orientation  Right;Left    Pain Descriptors / Indicators  Sore    Pain Type  Chronic pain    Pain Onset  1 to 4 weeks ago    Pain Frequency  Intermittent    Aggravating Factors   wearing shoes    Pain Relieving Factors  taking off shoes                       [facility] Adult PT Treatment/Exercise - 10/10/17 0001  Ambulation/Gait   Ambulation/Gait  Yes    Ambulation/Gait Assistance  5: Supervision    Ambulation/Gait Assistance Details  practising safety with rollator    Ambulation Distance (Feet)  200 Feet   +400   Assistive device  Rollator    Gait Pattern  Step-through pattern;Decreased arm swing - left;Decreased step length - right;Decreased step length - left;Decreased trunk rotation;Narrow base of support    Ambulation Surface  Level;Outdoor;Paved             PT Education - 10/10/17 1252    Education Details  Reiterated PT recommendation that she use the rollator at all times, indoor and outdoor and the [facility] in areas of home she can not manuever rollator due to high fall risk.  POC for balance training. How and when to appropriately use rollator brakes, how to fold up rollator to put in car (unable to actually put in car due to to much stuff being in trunk.                                                                                      Person(s) Educated  Patient    Methods  Explanation    Comprehension  Verbalized understanding;Need further instruction        PT Short Term Goals - 09/23/17 1703      PT SHORT TERM GOAL #1   Title  Pt will be independent with HEP for improved posture, balance, gait for improved functional mobility.  TARGET 10/21/17    Time  5    Period  Weeks    Status  New    Target Date  10/21/17      PT SHORT TERM GOAL #2   Title  Pt will perform 5x sit<>stand with no posterior LOB, in less than or equal to 11.5 seconds for improved transfer efficiency and safety.    Time  5    Period  Weeks    Status  New    Target Date  10/21/17      PT SHORT TERM GOAL #3   Title  Pt will improve MiniBESTest score to at least 18/28 for decreased fall risk.    Time  5    Period  Weeks    Status  New    Target Date  10/21/17      PT SHORT TERM GOAL #4   Title  Pt will verbalize understanding of fall prevention in home environment.    Time  5    Period  Weeks    Status  New    Target Date  10/21/17      PT SHORT TERM GOAL #5   Title  Pt will ambulate at least 200 ft using appropriate assistive device (cane vs walker) modified independently for improved safety with gait.    Time  5    Period  Weeks    Status  New    Target Date  10/21/17        PT Long Term Goals - 09/23/17 1707      PT LONG TERM GOAL #1   Title  Pt will verbalize plans for ongoing community fitness upon  d/c from PT.  TARGET 11/18/17    Time  9    Period  Weeks    Status  New    Target Date  11/18/17      PT LONG TERM GOAL #2   Title  Pt will perform at least 8 of 10 reps of sit<>stand from less than 18 inch surfaces, with no posterior lean for improved transfer efficiency and safety.    Time  9    Period  Weeks    Status  New    Target Date  11/18/17      PT LONG TERM GOAL #3   Title  Pt will improve MiniBESTest score to at least 23/28 for decreased fall risk.    Time  9    Period  Weeks    Status  New    Target Date  11/18/17      PT LONG TERM GOAL #4   Title  Pt will demo <2 steps to recover balance in posterior push and release  test for improved balance recovery in posterior direction.    Time  9    Period  Weeks    Status  New    Target Date  11/18/17      PT LONG TERM GOAL #5   Title  Pt will ambulate at least 1000 ft with appropriate assistive device, modified independently for improved community gait negotiation.    Time  9    Period  Weeks    Status  New    Target Date  11/18/17            Plan - 10/10/17 1256    Clinical Impression Statement  Pt reported misunderstanding PT recommedation of AD use but verbalized understanding after reeducation.  Pt required cues for when and how to use rollator brakes appropriately. Gait training with rollator: pt ambulated at supervision level with no LOB, working on maintaining good steplength.                                                                                              Rehab Potential  Good    Clinical Impairments Affecting Rehab Potential  Motivated for therapy; has current exercise routine    PT Frequency  2x / week   1x/wk for 1 week, then   PT Duration  8 weeks   9 weeks total POC   PT Treatment/Interventions  ADLs/Self Care Home Management;Gait training;Functional mobility training;Therapeutic activities;Therapeutic exercise;DME Instruction;Balance training;Neuromuscular re-education;Patient/family education    PT Next Visit Plan  Gait training with rollator; ? go out and practice putting rollator into her car; continue HEP to address balance recovery strategies    Consulted and Agree with Plan of Care  Patient       Patient will benefit from skilled therapeutic intervention in order to improve the following deficits and impairments:  Abnormal gait, Decreased balance, Decreased mobility, Decreased strength, Difficulty walking, Postural dysfunction  Visit Diagnosis: Unsteadiness on feet  Other abnormalities of gait and mobility  Other symptoms and signs involving the musculoskeletal system  Abnormal posture     Problem  List  Patient Active Problem List   Diagnosis Date Noted  . Pedal edema 10/03/2017  . Right arm pain 02/03/2017  . Recurrent falls 02/03/2017  . Advance care planning 01/23/2017  . Hematuria 07/20/2016  . Headache 05/11/2015  . Globus sensation 04/24/2015  . Foot pain 02/25/2015  . Shoulder pain 04/17/2014  . PD (Parkinson's disease) ([place]) 06/05/2013  . Medicare annual wellness visit, subsequent 05/02/2012  . Neck pain 05/02/2012  . Osteoporosis 03/08/2011  . Breast cancer ([place]) 11/19/2010  . GERD (gastroesophageal reflux disease) 08/01/2008  . DYSPHAGIA UNSPECIFIED 08/01/2008  . ORTHOSTATIC HYPOTENSION 06/28/2008  . HLD (hyperlipidemia) 07/10/2007  . MONOCLONAL GAMMOPATHY 07/10/2007  . ANEMIA-NOS 07/10/2007  . FATIGUE 07/10/2007    Bjorn Loser, PTA  10/10/17, 1:02 PM [facility] [address] [place], [name], [address] Phone: [phone]   Fax:  [phone]  Name: [name] MRN: [id number] Date of Birth: [birth year]

## 2017-10-14 ENCOUNTER — Ambulatory Visit: Payer: Medicare Other | Admitting: Physical Therapy

## 2017-10-14 ENCOUNTER — Ambulatory Visit: Payer: Medicare Other

## 2017-10-14 ENCOUNTER — Encounter: Payer: Self-pay | Admitting: Physical Therapy

## 2017-10-14 DIAGNOSIS — R2689 Other abnormalities of gait and mobility: Secondary | ICD-10-CM

## 2017-10-14 DIAGNOSIS — R471 Dysarthria and anarthria: Secondary | ICD-10-CM | POA: Diagnosis not present

## 2017-10-14 DIAGNOSIS — R29818 Other symptoms and signs involving the nervous system: Secondary | ICD-10-CM | POA: Diagnosis not present

## 2017-10-14 DIAGNOSIS — R293 Abnormal posture: Secondary | ICD-10-CM

## 2017-10-14 DIAGNOSIS — R29898 Other symptoms and signs involving the musculoskeletal system: Secondary | ICD-10-CM | POA: Diagnosis not present

## 2017-10-14 DIAGNOSIS — R2681 Unsteadiness on feet: Secondary | ICD-10-CM

## 2017-10-14 DIAGNOSIS — R1313 Dysphagia, pharyngeal phase: Secondary | ICD-10-CM

## 2017-10-14 NOTE — Therapy (Signed)
[name], [name], [address] Phone: [phone]   Fax:  [phone]  Speech Language Pathology Treatment  Patient Details  Name: [name] MRN: [id number] Date of Birth: 1940-12-30 Referring Provider: Alonza Bogus, D.O.   Encounter Date: 10/14/2017  End of Session - 10/14/17 1321    Visit Number  2    Number of Visits  17    Date for SLP Re-Evaluation  12/23/17   90 days   SLP Start Time  1103    SLP Stop Time   1145    SLP Time Calculation (min)  42 min    Activity Tolerance  Patient tolerated treatment well       Past Medical History:  Diagnosis Date  . Allergy    SEASONAL  . Anemia    Anemia-NOS / PMH, [name]     Past Surgical History:  Procedure Laterality Date  . APPENDECTOMY  2002  . BONE BIOPSY  11/06/10  . BREAST LUMPECTOMY  02/2010   right breast lumpectomy and sentinel node biopsy  . CARDIAC CATHETERIZATION  2005   neg  . CATARACT EXTRACTION, BILATERAL  2018  . CATARACT EXTRACTION, BILATERAL Bilateral 07/2016  . COLONOSCOPY     negative   . HEMORRHOID SURGERY  1970's  . SALPINGOOPHORECTOMY  2002   for benign growths  . SHOULDER SURGERY Right   . TOTAL ABDOMINAL HYSTERECTOMY W/ BILATERAL SALPINGOOPHORECTOMY  77 yrs old   for pain,prolapse ; G3 P2  . UPPER GASTROINTESTINAL ENDOSCOPY      There were no vitals filed for this visit.  Subjective Assessment - 10/14/17 1115    Subjective  "Not every day (I did the HEP). I was in [facility]    Currently in Pain?  Yes    Pain Score  2     Pain Location  Foot    Pain Orientation   Right;Left    Pain Descriptors / Indicators  Sore    Pain Type  Chronic pain    Pain Onset  1 to 4 weeks ago    Pain Frequency  Intermittent    Aggravating Factors   shoes    Pain Relieving Factors  doffing shoes            ADULT SLP TREATMENT - 10/14/17 1118      General Information   Behavior/Cognition  Alert;Pleasant mood;Cooperative      Treatment Provided   Treatment provided  Dysphagia      Dysphagia Treatment   Temperature Spikes Noted  No    Respiratory Status  Room air    Treatment Methods  Skilled observation;Therapeutic exercise;Patient/caregiver education    Patient observed directly with PO's  Yes    Type of PO's observed  Thin liquids    Pharyngeal Phase Signs & Symptoms  --   none noted today with smaller sips   Other treatment/comments  Min cues usually wiht HEP today. SLP questions pt's procedure with Caryl Ada, appeared to perform with a swallow but  difficult to judge today due to jaw tremors.       Cognitive-Linquistic Treatment   Treatment focused on  Dysarthria    Skilled Treatment  Pt tells SLP concern today about her bil leg swelling. PCP aware. Pt produced speech in simple conversation upper 60s - low 70s dB, after loud /a/ completed at upper 80s dB average, volume improved in simple conversation to WNL (lower 70s dB).  SLP enocuraged pt to cont to perform loud /a/ x5 reps, BID.      Assessment / Recommendations / Plan   Plan  Continue with current plan of care      Progression Toward Goals   Progression toward goals  Progressing toward goals         SLP Short Term Goals - 10/14/17 1145      SLP SHORT TERM GOAL #1   Title  pt will produce loud /a/ at average mid 80s dB over 3 therapy sessions    Time  4    Period  Weeks    Status  On-going      SLP SHORT TERM GOAL #2   Title  pt will increase conversational loudness to average low 70s dB average in 5 minutes simple conversation over three sessions    Time  4    Period  Weeks     Status  On-going      SLP SHORT TERM GOAL #3   Title  pt will improve loudness in 19/20 sentences to average low 70s dB over three sessions    Time  4    Period  Weeks    Status  On-going      SLP SHORT TERM GOAL #4   Title  pt will perform dysphagia HEP with rare min A over three sessions    Time  4    Period  Weeks    Status  On-going       SLP Long Term Goals - 10/14/17 1147      SLP LONG TERM GOAL #1   Title  pt will improve loudness in mod complex conversation of 7 minutes average low 70s dB, over 3 sessions    Time  8    Period  Weeks   or 17 visits, for all LTGs   Status  On-going      SLP LONG TERM GOAL #2   Title  pt will improve loudenss in 7 minutes mod complex-complex conversation over 2 sessions, average 70dB, in mod noisy environment    Time  8    Period  Weeks    Status  On-going      SLP LONG TERM GOAL #3   Title  pt will improve breath support to adequate, in 7 minutes mod complex/complex conversation    Time  8    Period  Weeks    Status  On-going      SLP LONG TERM GOAL #4   Title  pt will complete HEP for dysphagia over three sessions    Time  8    Period  Weeks    Status  On-going      SLP LONG TERM GOAL #5   Title  pt will follow swallow precautions in 3 sessions with POs    Time  8    Period  Weeks    Status  On-going       Plan - 10/14/17 1320    Clinical Impression Statement  Pt arrives today with mild pharyngeal dysphagia. HEP  for dysphagia was completed today with cues usually necessary from SLP. Pt also demo's mild dysarthria c/b reduced breath support and reduced speech volume. Pt's loud /a/ was measured at upper 80's today occasional min cues for louder production. Pt would cont to benefit from skilled ST focusing on improved swallow skills as well as incr'd loudness across speaking situations.    Speech Therapy Frequency  2x / week    Duration  --   8 weeks or 17 visits   Treatment/Interventions  Aspiration precaution  training;Pharyngeal strengthening exercises;Diet toleration management by SLP;Internal/external aids;Multimodal communcation approach;Patient/family education;Compensatory strategies;Functional tasks;Cueing hierarchy;SLP instruction and feedback;Environmental controls    Potential to Achieve Goals  Good       Patient will benefit from skilled therapeutic intervention in order to improve the following deficits and impairments:   Dysarthria and anarthria  Dysphagia, pharyngeal phase    Problem List Patient Active Problem List   Diagnosis Date Noted  . Pedal edema 10/03/2017  . Right arm pain 02/03/2017  . Recurrent falls 02/03/2017  . Advance care planning 01/23/2017  . Hematuria 07/20/2016  . Headache 05/11/2015  . Globus sensation 04/24/2015  . Foot pain 02/25/2015  . Shoulder pain 04/17/2014  . PD (Parkinson's disease) ([place]) 06/05/2013  . Medicare annual wellness visit, subsequent 05/02/2012  . Neck pain 05/02/2012  . Osteoporosis 03/08/2011  . Breast cancer ([place]) 11/19/2010  . GERD (gastroesophageal reflux disease) 08/01/2008  . DYSPHAGIA UNSPECIFIED 08/01/2008  . ORTHOSTATIC HYPOTENSION 06/28/2008  . HLD (hyperlipidemia) 07/10/2007  . MONOCLONAL GAMMOPATHY 07/10/2007  . ANEMIA-NOS 07/10/2007  . FATIGUE 07/10/2007    Luree Palla ,MS, CCC-SLP  10/14/2017, 1:22 PM  [facility] [address] [place], [name], [address] Phone: [phone]   Fax:  [phone]   Name: [name] MRN: [id number] Date of Birth: September 18, 1940

## 2017-10-14 NOTE — Therapy (Signed)
[facility] [address] [place], [name], [address] Phone: [phone]   Fax:  [phone]  Physical Therapy Treatment  Patient Details  Name: [name] MRN: [id number] Date of Birth: [birth year] Referring Provider: Alonza Bogus   Encounter Date: 10/14/2017  PT End of Session - 10/14/17 1318    Visit Number  4    Number of Visits  17    Date for PT Re-Evaluation  12/22/17    Authorization Type  Medicare and BCBS-will need 10th visit progress note    PT Start Time  1150    PT Stop Time  1231    PT Time Calculation (min)  41 min    Activity Tolerance  Patient tolerated treatment well    Behavior During Therapy  [facility] for tasks assessed/performed       Past Medical History:  Diagnosis Date  . Allergy    SEASONAL  . Anemia    Anemia-NOS / PMH, Dr Julien Nordmann  . Arthritis   . Breast cancer ([place])    stage I right breast  . Cataract    BILATERAL  . Decreased vision    R eye  . Esophageal Stricture   . Hyperlipidemia   . Monoclonal gammopathy   . MVP (mitral valve prolapse)   . Osteoporosis   . Parkinson disease ([place])   . Skin cancer    basal cell L neck  . Vertical diplopia     Past Surgical History:  Procedure Laterality Date  . APPENDECTOMY  2002  . BONE BIOPSY  11/06/10  . BREAST LUMPECTOMY  02/2010   right breast lumpectomy and sentinel node biopsy  . CARDIAC CATHETERIZATION  2005   neg  . CATARACT EXTRACTION, BILATERAL  2018  . CATARACT EXTRACTION, BILATERAL Bilateral 07/2016  . COLONOSCOPY     negative   . HEMORRHOID SURGERY  1970's  . SALPINGOOPHORECTOMY  2002   for benign growths  . SHOULDER SURGERY Right   . TOTAL ABDOMINAL HYSTERECTOMY W/ BILATERAL SALPINGOOPHORECTOMY  77 yrs old   for pain,prolapse ; G3 P2  . UPPER GASTROINTESTINAL ENDOSCOPY      There were no vitals filed for this visit.  Subjective Assessment - 10/14/17 1314    Subjective  reports a fall this week  -was  carrying flowers and stepping into builing and fell backwards - unsure if she hit her head or note, does not endorse any injuries or pain since fall - did not feel the need to seek any medical care following the fall. "I feel stupid walking with the walker."    Patient Stated Goals  Pt's goals for therapy are to learn to use a cane.    Currently in Pain?  Yes    Pain Score  2     Pain Location  Foot    Pain Orientation  Right;Left    Pain Descriptors / Indicators  Aching;Sore    Pain Type  Chronic pain                       [facility] Adult PT Treatment/Exercise - 10/14/17 0001      Ambulation/Gait   Ambulation/Gait  Yes    Ambulation/Gait Assistance  5: Supervision    Ambulation/Gait Assistance Details  practicing correct safety awareness with device for sitting on device as well as turning. Needs verbal cueing throughout to use brakes appropriately    Ambulation Distance (Feet)  --   2 laps around PT  gym   Assistive device  Rollator    Gait Pattern  Step-through pattern;Decreased arm swing - left;Decreased step length - right;Decreased step length - left;Decreased trunk rotation;Narrow base of support    Ambulation Surface  Level;Indoor          Balance Exercises - 10/14/17 1322      Balance Exercises: Standing   Tandem Stance  Eyes open;Upper extremity support 1;2 reps;20 secs    Step Ups  Forward;6 inch;UE support 2   reduced spatial awareness of LE   Tandem Gait  Forward;3 reps;Intermittent upper extremity support   in // bars   Retro Gait  Upper extremity support;3 reps   in // bars   Sit to Stand Time  from mat table x 10 with arms crossed over chest - requires verbal cueing for eccentric lowering    Other Standing Exercises  toe taps to 6" step - intermittent 2 UE support - requires verbal cues to slow movements due to multiple instances of LOB requiring up to Min A         PT Education - 10/14/17 1316    Education Details  Education on fall safety with  patient - encouraged her to alert MD if patient hits head (reports a history of hitting her head many times with falls). Education on brake usage wit device - attempted to sit on rollator without brake application - education brake vs use of wall for safety    Person(s) Educated  Patient    Methods  Explanation;Demonstration    Comprehension  Verbalized understanding;Need further instruction       PT Short Term Goals - 09/23/17 1703      PT SHORT TERM GOAL #1   Title  Pt will be independent with HEP for improved posture, balance, gait for improved functional mobility.  TARGET 10/21/17    Time  5    Period  Weeks    Status  New    Target Date  10/21/17      PT SHORT TERM GOAL #2   Title  Pt will perform 5x sit<>stand with no posterior LOB, in less than or equal to 11.5 seconds for improved transfer efficiency and safety.    Time  5    Period  Weeks    Status  New    Target Date  10/21/17      PT SHORT TERM GOAL #3   Title  Pt will improve MiniBESTest score to at least 18/28 for decreased fall risk.    Time  5    Period  Weeks    Status  New    Target Date  10/21/17      PT SHORT TERM GOAL #4   Title  Pt will verbalize understanding of fall prevention in home environment.    Time  5    Period  Weeks    Status  New    Target Date  10/21/17      PT SHORT TERM GOAL #5   Title  Pt will ambulate at least 200 ft using appropriate assistive device (cane vs walker) modified independently for improved safety with gait.    Time  5    Period  Weeks    Status  New    Target Date  10/21/17        PT Long Term Goals - 09/23/17 1707      PT LONG TERM GOAL #1   Title  Pt will verbalize plans for ongoing community fitness  upon d/c from PT.  TARGET 11/18/17    Time  9    Period  Weeks    Status  New    Target Date  11/18/17      PT LONG TERM GOAL #2   Title  Pt will perform at least 8 of 10 reps of sit<>stand from less than 18 inch surfaces, with no posterior lean for improved  transfer efficiency and safety.    Time  9    Period  Weeks    Status  New    Target Date  11/18/17      PT LONG TERM GOAL #3   Title  Pt will improve MiniBESTest score to at least 23/28 for decreased fall risk.    Time  9    Period  Weeks    Status  New    Target Date  11/18/17      PT LONG TERM GOAL #4   Title  Pt will demo <2 steps to recover balance in posterior push and release test for improved balance recovery in posterior direction.    Time  9    Period  Weeks    Status  New    Target Date  11/18/17      PT LONG TERM GOAL #5   Title  Pt will ambulate at least 1000 ft with appropriate assistive device, modified independently for improved community gait negotiation.    Time  9    Period  Weeks    Status  New    Target Date  11/18/17            Plan - 10/14/17 1319    Clinical Impression Statement  Patient reporting fall following last visit - fell backwards but does not endorse pain or injury - heavy education on safety in the home environment and to alert MD if she were to hit her head. PT sesson focusing on education with rollator in home and community as well as dynamic balance activities. Difficulty maintaining focus on activities today with need for redirection throughout session. Patient to continue to progress towards established goals.     Rehab Potential  Good    Clinical Impairments Affecting Rehab Potential  Motivated for therapy; has current exercise routine    PT Frequency  2x / week   1x/wk for 1 week, then   PT Duration  8 weeks   9 weeks total POC   PT Treatment/Interventions  ADLs/Self Care Home Management;Gait training;Functional mobility training;Therapeutic activities;Therapeutic exercise;DME Instruction;Balance training;Neuromuscular re-education;Patient/family education    PT Next Visit Plan  Gait training with rollator; ? go out and practice putting rollator into her car; continue HEP to address balance recovery strategies    Consulted and  Agree with Plan of Care  Patient       Patient will benefit from skilled therapeutic intervention in order to improve the following deficits and impairments:  Abnormal gait, Decreased balance, Decreased mobility, Decreased strength, Difficulty walking, Postural dysfunction  Visit Diagnosis: Unsteadiness on feet  Other abnormalities of gait and mobility  Other symptoms and signs involving the musculoskeletal system  Abnormal posture     Problem List Patient Active Problem List   Diagnosis Date Noted  . Pedal edema 10/03/2017  . Right arm pain 02/03/2017  . Recurrent falls 02/03/2017  . Advance care planning 01/23/2017  . Hematuria 07/20/2016  . Headache 05/11/2015  . Globus sensation 04/24/2015  . Foot pain 02/25/2015  . Shoulder pain 04/17/2014  . PD (Parkinson's  disease) ([place]) 06/05/2013  . Medicare annual wellness visit, subsequent 05/02/2012  . Neck pain 05/02/2012  . Osteoporosis 03/08/2011  . Breast cancer ([place]) 11/19/2010  . GERD (gastroesophageal reflux disease) 08/01/2008  . DYSPHAGIA UNSPECIFIED 08/01/2008  . ORTHOSTATIC HYPOTENSION 06/28/2008  . HLD (hyperlipidemia) 07/10/2007  . MONOCLONAL GAMMOPATHY 07/10/2007  . ANEMIA-NOS 07/10/2007  . FATIGUE 07/10/2007     Lanney Gins, PT, DPT Supplemental Physical Therapist 10/14/17 1:30 PM Pager: [phone] Office: [facility] [address] [place], [name], [address] Phone: [phone]   Fax:  [phone]  Name: [name] MRN: [id number] Date of Birth: February 27, 1940

## 2017-10-17 ENCOUNTER — Ambulatory Visit: Payer: Medicare Other | Admitting: Physical Therapy

## 2017-10-17 ENCOUNTER — Telehealth: Payer: Self-pay | Admitting: Internal Medicine

## 2017-10-17 ENCOUNTER — Encounter: Payer: Self-pay | Admitting: Internal Medicine

## 2017-10-17 ENCOUNTER — Inpatient Hospital Stay: Payer: Medicare Other

## 2017-10-17 ENCOUNTER — Encounter: Payer: Self-pay | Admitting: Physical Therapy

## 2017-10-17 ENCOUNTER — Inpatient Hospital Stay: Payer: Medicare Other | Attending: Internal Medicine | Admitting: Internal Medicine

## 2017-10-17 VITALS — BP 140/75 | HR 83 | Temp 98.1°F | Resp 18 | Ht 63.0 in | Wt 124.4 lb

## 2017-10-17 DIAGNOSIS — M810 Age-related osteoporosis without current pathological fracture: Secondary | ICD-10-CM | POA: Insufficient documentation

## 2017-10-17 DIAGNOSIS — R29818 Other symptoms and signs involving the nervous system: Secondary | ICD-10-CM | POA: Diagnosis not present

## 2017-10-17 DIAGNOSIS — Z79810 Long term (current) use of selective estrogen receptor modulators (SERMs): Secondary | ICD-10-CM | POA: Diagnosis not present

## 2017-10-17 DIAGNOSIS — R5383 Other fatigue: Secondary | ICD-10-CM

## 2017-10-17 DIAGNOSIS — Z79899 Other long term (current) drug therapy: Secondary | ICD-10-CM | POA: Insufficient documentation

## 2017-10-17 DIAGNOSIS — Z170 Estrogen receptor positive status [ER+]: Secondary | ICD-10-CM

## 2017-10-17 DIAGNOSIS — R29898 Other symptoms and signs involving the musculoskeletal system: Secondary | ICD-10-CM | POA: Diagnosis not present

## 2017-10-17 DIAGNOSIS — R471 Dysarthria and anarthria: Secondary | ICD-10-CM | POA: Diagnosis not present

## 2017-10-17 DIAGNOSIS — C50011 Malignant neoplasm of nipple and areola, right female breast: Secondary | ICD-10-CM | POA: Insufficient documentation

## 2017-10-17 DIAGNOSIS — D472 Monoclonal gammopathy: Secondary | ICD-10-CM

## 2017-10-17 DIAGNOSIS — R1313 Dysphagia, pharyngeal phase: Secondary | ICD-10-CM | POA: Diagnosis not present

## 2017-10-17 DIAGNOSIS — C50919 Malignant neoplasm of unspecified site of unspecified female breast: Secondary | ICD-10-CM

## 2017-10-17 DIAGNOSIS — Z85828 Personal history of other malignant neoplasm of skin: Secondary | ICD-10-CM

## 2017-10-17 DIAGNOSIS — R2689 Other abnormalities of gait and mobility: Secondary | ICD-10-CM | POA: Diagnosis not present

## 2017-10-17 DIAGNOSIS — R2681 Unsteadiness on feet: Secondary | ICD-10-CM | POA: Diagnosis not present

## 2017-10-17 DIAGNOSIS — Z7982 Long term (current) use of aspirin: Secondary | ICD-10-CM

## 2017-10-17 LAB — CBC WITH DIFFERENTIAL (CANCER CENTER ONLY)
Basophils Absolute: 0.1 10*3/uL (ref 0.0–0.1)
Basophils Relative: 1 %
Eosinophils Absolute: 0.3 10*3/uL (ref 0.0–0.5)
Eosinophils Relative: 4 %
HCT: 34.2 % — ABNORMAL LOW (ref 34.8–46.6)
Hemoglobin: 11.4 g/dL — ABNORMAL LOW (ref 11.6–15.9)
Lymphocytes Relative: 27 %
Lymphs Abs: 1.6 10*3/uL (ref 0.9–3.3)
MCH: 32.9 pg (ref 25.1–34.0)
MCHC: 33.5 g/dL (ref 31.5–36.0)
MCV: 98.2 fL (ref 79.5–101.0)
Monocytes Absolute: 0.3 10*3/uL (ref 0.1–0.9)
Monocytes Relative: 5 %
Neutro Abs: 3.9 10*3/uL (ref 1.5–6.5)
Neutrophils Relative %: 63 %
Platelet Count: 230 10*3/uL (ref 145–400)
RBC: 3.48 MIL/uL — ABNORMAL LOW (ref 3.70–5.45)
RDW: 15.3 % — ABNORMAL HIGH (ref 11.2–14.5)
WBC Count: 6.2 10*3/uL (ref 3.9–10.3)

## 2017-10-17 LAB — CMP (CANCER CENTER ONLY)
ALT: <6 U/L (ref 0–44)
AST: 21 U/L (ref 15–41)
Albumin: 3.5 g/dL (ref 3.5–5.0)
Alkaline Phosphatase: 52 U/L (ref 38–126)
Anion gap: 8 (ref 5–15)
BUN: 14 mg/dL (ref 8–23)
CO2: 27 mmol/L (ref 22–32)
Calcium: 8.7 mg/dL — ABNORMAL LOW (ref 8.9–10.3)
Chloride: 108 mmol/L (ref 98–111)
Creatinine: 0.79 mg/dL (ref 0.44–1.00)
GFR, Est AFR Am: >60 mL/min (ref >60)
GFR, Estimated: >60 mL/min (ref >60)
Glucose, Bld: 98 mg/dL (ref 70–99)
Potassium: 3.8 mmol/L (ref 3.5–5.1)
Sodium: 143 mmol/L (ref 135–145)
Total Bilirubin: 0.4 mg/dL (ref 0.3–1.2)
Total Protein: 6.9 g/dL (ref 6.5–8.1)

## 2017-10-17 LAB — LACTATE DEHYDROGENASE: LDH: 185 U/L (ref 98–192)

## 2017-10-17 MED ORDER — DENOSUMAB 60 MG/ML ~~LOC~~ SOSY
PREFILLED_SYRINGE | SUBCUTANEOUS | Status: AC
  Filled 2017-10-17: qty 1

## 2017-10-17 MED ORDER — DENOSUMAB 60 MG/ML ~~LOC~~ SOLN
60.0000 mg | Freq: Once | SUBCUTANEOUS | Status: AC
  Administered 2017-10-17: 60 mg via SUBCUTANEOUS

## 2017-10-17 NOTE — Patient Instructions (Signed)
Denosumab injection  What is this medicine?  DENOSUMAB (den oh sue mab) slows bone breakdown. Prolia is used to treat osteoporosis in women after menopause and in men. Xgeva is used to prevent bone fractures and other bone problems caused by cancer bone metastases. Xgeva is also used to treat giant cell tumor of the bone.  This medicine may be used for other purposes; ask your health care provider or pharmacist if you have questions.  What should I tell my health care provider before I take this medicine?  They need to know if you have any of these conditions:  -dental disease  -eczema  -infection or history of infections  -kidney disease or on dialysis  -low blood calcium or vitamin D  -malabsorption syndrome  -scheduled to have surgery or tooth extraction  -taking medicine that contains denosumab  -thyroid or parathyroid disease  -an unusual reaction to denosumab, other medicines, foods, dyes, or preservatives  -pregnant or trying to get pregnant  -breast-feeding  How should I use this medicine?  This medicine is for injection under the skin. It is given by a health care professional in a hospital or clinic setting.  If you are getting Prolia, a special MedGuide will be given to you by the pharmacist with each prescription and refill. Be sure to read this information carefully each time.  For Prolia, talk to your pediatrician regarding the use of this medicine in children. Special care may be needed. For Xgeva, talk to your pediatrician regarding the use of this medicine in children. While this drug may be prescribed for children as young as 13 years for selected conditions, precautions do apply.  Overdosage: If you think you have taken too much of this medicine contact a poison control center or emergency room at once.  NOTE: This medicine is only for you. Do not share this medicine with others.  What if I miss a dose?  It is important not to miss your dose. Call your doctor or health care professional if you are  unable to keep an appointment.  What may interact with this medicine?  Do not take this medicine with any of the following medications:  -other medicines containing denosumab  This medicine may also interact with the following medications:  -medicines that suppress the immune system  -medicines that treat cancer  -steroid medicines like prednisone or cortisone  This list may not describe all possible interactions. Give your health care provider a list of all the medicines, herbs, non-prescription drugs, or dietary supplements you use. Also tell them if you smoke, drink alcohol, or use illegal drugs. Some items may interact with your medicine.  What should I watch for while using this medicine?  Visit your doctor or health care professional for regular checks on your progress. Your doctor or health care professional may order blood tests and other tests to see how you are doing.  Call your doctor or health care professional if you get a cold or other infection while receiving this medicine. Do not treat yourself. This medicine may decrease your body's ability to fight infection.  You should make sure you get enough calcium and vitamin D while you are taking this medicine, unless your doctor tells you not to. Discuss the foods you eat and the vitamins you take with your health care professional.  See your dentist regularly. Brush and floss your teeth as directed. Before you have any dental work done, tell your dentist you are receiving this medicine.  Do   not become pregnant while taking this medicine or for 5 months after stopping it. Women should inform their doctor if they wish to become pregnant or think they might be pregnant. There is a potential for serious side effects to an unborn child. Talk to your health care professional or pharmacist for more information.  What side effects may I notice from receiving this medicine?  Side effects that you should report to your doctor or health care professional as soon as  possible:  -allergic reactions like skin rash, itching or hives, swelling of the face, lips, or tongue  -breathing problems  -chest pain  -fast, irregular heartbeat  -feeling faint or lightheaded, falls  -fever, chills, or any other sign of infection  -muscle spasms, tightening, or twitches  -numbness or tingling  -skin blisters or bumps, or is dry, peels, or red  -slow healing or unexplained pain in the mouth or jaw  -unusual bleeding or bruising  Side effects that usually do not require medical attention (Report these to your doctor or health care professional if they continue or are bothersome.):  -muscle pain  -stomach upset, gas  This list may not describe all possible side effects. Call your doctor for medical advice about side effects. You may report side effects to FDA at 1-800-FDA-1088.  Where should I keep my medicine?  This medicine is only given in a clinic, doctor's office, or other health care setting and will not be stored at home.  NOTE: This sheet is a summary. It may not cover all possible information. If you have questions about this medicine, talk to your doctor, pharmacist, or health care provider.      2016, Elsevier/Gold Standard. (2011-07-12 12:37:47)

## 2017-10-17 NOTE — Telephone Encounter (Signed)
Appts scheduled AVS/Calendar printed per 9/23 los °

## 2017-10-17 NOTE — Therapy (Signed)
[facility] [address] [place], [name], [address] Phone: [phone]   Fax:  [phone]  Physical Therapy Treatment  Patient Details  Name: [name] MRN: [id number] Date of Birth: 1940-11-24 Referring Provider: Alonza Bogus   Encounter Date: 10/17/2017  PT End of Session - 10/17/17 1536    Visit Number  5    Number of Visits  17    Date for PT Re-Evaluation  12/22/17    Authorization Type  Medicare and BCBS-will need 10th visit progress note    PT Start Time  1530    PT Stop Time  1617    PT Time Calculation (min)  47 min    Equipment Utilized During Treatment  Gait belt    Activity Tolerance  Patient tolerated treatment well    Behavior During Therapy  [facility] for tasks assessed/performed       Past Medical History:  Diagnosis Date  . Allergy    SEASONAL  . Anemia    Anemia-NOS / PMH, Dr Julien Nordmann  . Arthritis   . Breast cancer ([place])    stage I right breast  . Cataract    BILATERAL  . Decreased vision    R eye  . Esophageal Stricture   . Hyperlipidemia   . Monoclonal gammopathy   . MVP (mitral valve prolapse)   . Osteoporosis   . Parkinson disease ([place])   . Skin cancer    basal cell L neck  . Vertical diplopia     Past Surgical History:  Procedure Laterality Date  . APPENDECTOMY  2002  . BONE BIOPSY  11/06/10  . BREAST LUMPECTOMY  02/2010   right breast lumpectomy and sentinel node biopsy  . CARDIAC CATHETERIZATION  2005   neg  . CATARACT EXTRACTION, BILATERAL  2018  . CATARACT EXTRACTION, BILATERAL Bilateral 07/2016  . COLONOSCOPY     negative   . HEMORRHOID SURGERY  1970's  . SALPINGOOPHORECTOMY  2002   for benign growths  . SHOULDER SURGERY Right   . TOTAL ABDOMINAL HYSTERECTOMY W/ BILATERAL SALPINGOOPHORECTOMY  77 yrs old W/ BILATERAL SALPINGOOPHORECTOMY  77 yrs old   for pain,prolapse ; G3 P2  . UPPER GASTROINTESTINAL ENDOSCOPY      There were no vitals filed for this visit.  Subjective Assessment - 10/17/17 1533     Subjective  Reports one near fall. Walking along her walkway the part with gravel and stepping stones, not sure what happened but lost balance to her rt and fell up against the fence (no injuries). States she was wearing higher heels as she was on her way to church    Patient Stated Goals  Pt's goals for therapy are to learn to use a cane.    Currently in Pain?  No/denies                       [facility] Adult PT Treatment/Exercise - 10/17/17 1606      Transfers   Sit to Stand  6: Modified independent (Device/Increase time);Without upper extremity assist   to cane or rollator     Ambulation/Gait   Ambulation/Gait Assistance  5: Supervision    Ambulation/Gait Assistance Details  with head turns, head nods, carrying 1# in one hand and 2# in the other, head turns looking for objects, all with one imbalance that required assist to regain balance    Ambulation Distance (Feet)  800 Feet    Assistive device  Rollator;Straight cane;None    Gait Pattern  Step-through  pattern;Decreased arm swing - left;Decreased step length - right;Decreased trunk rotation;Narrow base of support    Ambulation Surface  Level      Posture/Postural Control   Posture/Postural Control  Postural limitations    Postural Limitations  Rounded Shoulders;Forward head          Balance Exercises - 10/17/17 1609      Balance Exercises: Standing   Standing Eyes Closed  Narrow base of support (BOS);Wide (BOA);Solid surface    Rockerboard  Anterior/posterior;Head turns;EO;EC   ankle sway; hip strategy; step off/on alternating legs   Gait with Head Turns  Forward;Retro    Tandem Gait  Forward;Retro    Retro Gait  Head turns    Turning  Both    Other Standing Exercises  sudden stops        PT Education - 10/17/17 1709    Education Details  addition to HEP    Person(s) Educated  Patient    Methods  Explanation;Demonstration;Verbal cues;Handout    Comprehension  Verbalized understanding;Returned  demonstration;Verbal cues required;Need further instruction       PT Short Term Goals - 09/23/17 1703      PT SHORT TERM GOAL #1   Title  Pt will be independent with HEP for improved posture, balance, gait for improved functional mobility.  TARGET 10/21/17    Time  5    Period  Weeks    Status  New    Target Date  10/21/17      PT SHORT TERM GOAL #2   Title  Pt will perform 5x sit<>stand with no posterior LOB, in less than or equal to 11.5 seconds for improved transfer efficiency and safety.    Time  5    Period  Weeks    Status  New    Target Date  10/21/17      PT SHORT TERM GOAL #3   Title  Pt will improve MiniBESTest score to at least 18/28 for decreased fall risk.    Time  5    Period  Weeks    Status  New    Target Date  10/21/17      PT SHORT TERM GOAL #4   Title  Pt will verbalize understanding of fall prevention in home environment.    Time  5    Period  Weeks    Status  New    Target Date  10/21/17      PT SHORT TERM GOAL #5   Title  Pt will ambulate at least 200 ft using appropriate assistive device (cane vs walker) modified independently for improved safety with gait.    Time  5    Period  Weeks    Status  New    Target Date  10/21/17        PT Long Term Goals - 09/23/17 1707      PT LONG TERM GOAL #1   Title  Pt will verbalize plans for ongoing community fitness upon d/c from PT.  TARGET 11/18/17    Time  9    Period  Weeks    Status  New    Target Date  11/18/17      PT LONG TERM GOAL #2   Title  Pt will perform at least 8 of 10 reps of sit<>stand from less than 18 inch surfaces, with no posterior lean for improved transfer efficiency and safety.    Time  9    Period  Weeks    Status  New    Target Date  11/18/17      PT LONG TERM GOAL #3   Title  Pt will improve MiniBESTest score to at least 23/28 for decreased fall risk.    Time  9    Period  Weeks    Status  New    Target Date  11/18/17      PT LONG TERM GOAL #4   Title  Pt will demo  <2 steps to recover balance in posterior push and release test for improved balance recovery in posterior direction.    Time  9    Period  Weeks    Status  New    Target Date  11/18/17      PT LONG TERM GOAL #5   Title  Pt will ambulate at least 1000 ft with appropriate assistive device, modified independently for improved community gait negotiation.    Time  9    Period  Weeks    Status  New    Target Date  11/18/17            Plan - 10/17/17 1711    Clinical Impression Statement  Session focused on gait and balance training. Patient has been using her rollator when she is in the community (except for her morning walks for 45 minutes with her boyfriend--she holds onto his arm instead). Balance improved overall. Will repeat measures for STGs next visit.     Rehab Potential  Good    Clinical Impairments Affecting Rehab Potential  Motivated for therapy; has current exercise routine    PT Frequency  2x / week   1x/wk for 1 week, then   PT Duration  8 weeks   9 weeks total POC   PT Treatment/Interventions  ADLs/Self Care Home Management;Gait training;Functional mobility training;Therapeutic activities;Therapeutic exercise;DME Instruction;Balance training;Neuromuscular re-education;Patient/family education    PT Next Visit Plan  check STGs, add to HEP (only has 2 ex's)  continue HEP to address balance recovery strategies    Consulted and Agree with Plan of Care  Patient       Patient will benefit from skilled therapeutic intervention in order to improve the following deficits and impairments:  Abnormal gait, Decreased balance, Decreased mobility, Decreased strength, Difficulty walking, Postural dysfunction  Visit Diagnosis: Unsteadiness on feet  Other abnormalities of gait and mobility  Other symptoms and signs involving the nervous system     Problem List Patient Active Problem List   Diagnosis Date Noted  . Pedal edema 10/03/2017  . Right arm pain 02/03/2017  .  Recurrent falls 02/03/2017  . Advance care planning 01/23/2017  . Hematuria 07/20/2016  . Headache 05/11/2015  . Globus sensation 04/24/2015  . Foot pain 02/25/2015  . Shoulder pain 04/17/2014  . PD (Parkinson's disease) ([place]) 06/05/2013  . Medicare annual wellness visit, subsequent 05/02/2012  . Neck pain 05/02/2012  . Osteoporosis 03/08/2011  . Breast cancer ([place]) 11/19/2010  . GERD (gastroesophageal reflux disease) 08/01/2008  . DYSPHAGIA UNSPECIFIED 08/01/2008  . ORTHOSTATIC HYPOTENSION 06/28/2008  . HLD (hyperlipidemia) 07/10/2007  . MONOCLONAL GAMMOPATHY 07/10/2007  . ANEMIA-NOS 07/10/2007  . FATIGUE 07/10/2007    Rexanne Mano, PT 10/17/2017, 5:16 PM  [facility] [address] [place], [name], [address] Phone: [phone]   Fax:  [phone]  Name: LINDSY CERULLO MRN: [id number] Date of Birth: [birth year]

## 2017-10-17 NOTE — Progress Notes (Signed)
[facility] [phone]   [phone]  OFFICE PROGRESS NOTE  Tonia Ghent, MD [name] [address]  DIAGNOSIS:  1) Node-negative breast cancer status post completion of radiation 05/06/2010 currently on tamoxifen as well as prolia.  2) Monoclonal gammopathy of unknown significance on observation since 2007   PRIOR THERAPY:  1) status post right lumpectomy on 03/05/2010 and it showed invasive ductal carcinoma 0.9 CM, Positive for ER/PR and negative for HER-2, with ductal carcinoma in situ present and negative sentinel lymph node biopsies.  2) status post adjuvant radiotherapy completed in April of 2012   CURRENT THERAPY:  1) tamoxifen 20 mg by mouth daily started in 2012.  2) Prolia subcutaneous injection every 6 months.  INTERVAL HISTORY: [name] 77 y.o. female returns to the clinic today for six-month follow-up visit.  The patient is feeling fine today with no concerning complaints except for generalized fatigue and weakness.  She currently uses a cane and sometimes walker for balance issue.  She denied having any recent chest pain, shortness breath, cough or hemoptysis.  She denied having any fever or chills.  She has no nausea, vomiting, diarrhea or constipation.  She continues to tolerate her treatment with tamoxifen fairly well.  The patient is here today for evaluation with repeat myeloma panel as well as CA 27.29.  MEDICAL HISTORY: Past Medical History:  Diagnosis Date  . Allergy    SEASONAL  . Anemia    Anemia-NOS / PMH, Dr Julien Nordmann  . Arthritis   . Breast cancer ([place])    stage I right breast  . Cataract    BILATERAL  . Decreased vision    R eye  . Esophageal Stricture   . Hyperlipidemia   . Monoclonal gammopathy   . MVP (mitral valve prolapse)   . Osteoporosis   . Parkinson disease ([place])   . Skin cancer    basal cell L neck  . Vertical diplopia     ALLERGIES:  is allergic to  penicillins.  MEDICATIONS:  Current Outpatient Medications  Medication Sig Dispense Refill  . amantadine (SYMMETREL) 100 MG capsule Take 1 capsule (100 mg total) by mouth 2 (two) times daily. 60 capsule 5  . aspirin 81 MG tablet Take 81 mg by mouth daily.      [name] b complex vitamins tablet Take 1 tablet by mouth daily.    . Calcium 1500 MG tablet Take 1,500 mg by mouth.      . carbidopa-levodopa (SINEMET IR) 25-100 MG tablet TAKE 1 & 1/2 (ONE & ONE-HALF) TABLETS BY MOUTH THREE TIMES DAILY 405 tablet 1  . Cholecalciferol 5000 units TABS Take 1 tablet (5,000 Units total) by mouth daily.    . clonazePAM (KLONOPIN) 0.5 MG tablet TAKE 1/2 (ONE-HALF) TABLET BY MOUTH AT BEDTIME 15 tablet 5  . Denosumab (PROLIA [place]) Inject into the skin every 6 (six) months.    . Elastic Bandages & Supports (ABDOMINAL BINDER/ELASTIC SMALL) MISC 1 Device by Does not apply route daily. 1 each 0  . fluticasone (FLONASE) 50 MCG/ACT nasal spray Place 2 sprays into both nostrils daily. 16 g 1  . Multiple Vitamin (MULTIVITAMIN) capsule 2 tabs po qd     . nitroGLYCERIN (NITROSTAT) 0.3 MG SL tablet Place 1 tablet (0.3 mg total) under the tongue every 5 (five) minutes as needed (max 3 doses). Reported on 04/07/2015 25 tablet 3  . Omega-3 Fatty Acids (OMEGA 3 PO)  1 tab po qd     . omeprazole (PRILOSEC) 20 MG capsule Take 1 capsule (20 mg total) by mouth daily. 90 capsule 3  . tamoxifen (NOLVADEX) 20 MG tablet TAKE 1 TABLET BY MOUTH ONCE DAILY 30 tablet 2  . traMADol (ULTRAM) 50 MG tablet TAKE 1 TO 2 TABLETS BY MOUTH AT BEDTIME AS NEEDED FOR PAIN 60 tablet 5  . venlafaxine XR (EFFEXOR-XR) 75 MG 24 hr capsule TAKE 1 CAPSULE BY MOUTH ONCE DAILY WITH BREAKFAST 90 capsule 1  . vitamin B-12 (CYANOCOBALAMIN) 500 MCG tablet Take 2500 mcg daily.     Current Facility-Administered Medications  Medication Dose Route Frequency Provider Last Rate Last Dose  . incobotulinumtoxinA (XEOMIN) 100 units injection 100 Units  100 Units Intramuscular  Q90 days Tat, Eustace Quail, DO   100 Units at 07/22/17 1122    SURGICAL HISTORY:  Past Surgical History:  Procedure Laterality Date  . APPENDECTOMY  2002  . BONE BIOPSY  11/06/10  . BREAST LUMPECTOMY  02/2010   right breast lumpectomy and sentinel node biopsy  . CARDIAC CATHETERIZATION  2005   neg  . CATARACT EXTRACTION, BILATERAL  2018  . CATARACT EXTRACTION, BILATERAL Bilateral 07/2016  . COLONOSCOPY     negative   . HEMORRHOID SURGERY  1970's  . SALPINGOOPHORECTOMY  2002   for benign growths  . SHOULDER SURGERY Right   . TOTAL ABDOMINAL HYSTERECTOMY W/ BILATERAL SALPINGOOPHORECTOMY  77 yrs old   for pain,prolapse ; G3 P2  . UPPER GASTROINTESTINAL ENDOSCOPY      REVIEW OF SYSTEMS:  A comprehensive review of systems was negative except for: Constitutional: positive for fatigue Musculoskeletal: positive for arthralgias and muscle weakness   PHYSICAL EXAMINATION: General appearance: alert, cooperative, fatigued and no distress Head: Normocephalic, without obvious abnormality, atraumatic Neck: no adenopathy, no JVD, supple, symmetrical, trachea midline and thyroid not enlarged, symmetric, no tenderness/mass/nodules Lymph nodes: Cervical, supraclavicular, and axillary nodes normal. Resp: clear to auscultation bilaterally Back: symmetric, no curvature. ROM normal. No CVA tenderness. Cardio: regular rate and rhythm, S1, S2 normal, no murmur, click, rub or gallop GI: soft, non-tender; bowel sounds normal; no masses,  no organomegaly Extremities: extremities normal, atraumatic, no cyanosis or edema   ECOG PERFORMANCE STATUS: 1 - Symptomatic but completely ambulatory  Blood pressure 140/75, pulse 83, temperature 98.1 F (36.7 C), temperature source Oral, resp. rate 18, height _0  (1.6 m), weight 124 lb 6.4 oz (56.4 kg), SpO2 99 %.  LABORATORY DATA: Lab Results  Component Value Date   WBC 6.2 10/17/2017   HGB 11.4 (L) 10/17/2017   HCT 34.2 (L) 10/17/2017   MCV 98.2 10/17/2017     PLT 230 10/17/2017      Chemistry      Component Value Date/Time   NA 142 04/05/2017 1101   NA 141 10/13/2016 1141   K 3.8 04/05/2017 1101   K 3.6 10/13/2016 1141   CL 107 04/05/2017 1101   CL 107 03/21/2012 1501   CO2 27 04/05/2017 1101   CO2 27 10/13/2016 1141   BUN 11 04/05/2017 1101   BUN 20.0 10/13/2016 1141   CREATININE 0.76 04/05/2017 1101   CREATININE 0.8 10/13/2016 1141      Component Value Date/Time   CALCIUM 9.0 04/05/2017 1101   CALCIUM 9.5 10/13/2016 1141   ALKPHOS 46 04/05/2017 1101   ALKPHOS 50 10/13/2016 1141   AST 27 04/05/2017 1101   AST 25 10/13/2016 1141   ALT <6 04/05/2017 1101   ALT 17  10/13/2016 1141   BILITOT 0.4 04/05/2017 1101   BILITOT 0.27 10/13/2016 1141       RADIOGRAPHIC STUDIES: No results found.  ASSESSMENT AND PLAN:  This is a very pleasant 77 years old white female with history of breast adenocarcinoma currently on tamoxifen as well as history of monoclonal gammopathy of undetermined significance currently on observation. She has no complaints today except for the generalized fatigue and weakness in the lower extremities. The patient had repeat myeloma panel performed earlier today but unfortunately the results are still pending. I recommended for her to continue on observation for now and repeat myeloma panel in 6 months unless the pending lab results showed any concerning findings, I will call the patient sooner for recommendation. For the osteoporosis, the patient will continue on her current treatment with Porlia every 6 months. She was advised to call immediately if she has any concerning symptoms in the interval. The patient voices understanding of current disease status and treatment options and is in agreement with the current care plan.  All questions were answered. The patient knows to call the clinic with any problems, questions or concerns. We can certainly see the patient much sooner if necessary. I spent 10 minutes  counseling the patient face to face. The total time spent in the appointment was 15 minutes.  Disclaimer: This note was dictated with voice recognition software. Similar sounding words can inadvertently be transcribed and may not be corrected upon review.

## 2017-10-17 NOTE — Patient Instructions (Signed)
Access Code: [email or web address]  URL: [email or web address]  Date: 10/17/2017  Prepared by: Barry Brunner   Exercises  Single Leg Stance with Support - 3-5 reps - 1 sets - 10-30 seconds hold - 2x daily - 7x weekly

## 2017-10-18 LAB — IGG, IGA, IGM
IgA: 32 mg/dL — ABNORMAL LOW (ref 64–422)
IgG (Immunoglobin G), Serum: 1537 mg/dL (ref 700–1600)
IgM (Immunoglobulin M), Srm: 24 mg/dL — ABNORMAL LOW (ref 26–217)

## 2017-10-18 LAB — KAPPA/LAMBDA LIGHT CHAINS
Kappa free light chain: 61.7 mg/L — ABNORMAL HIGH (ref 3.3–19.4)
Kappa, lambda light chain ratio: 7.62 — ABNORMAL HIGH (ref 0.26–1.65)
Lambda free light chains: 8.1 mg/L (ref 5.7–26.3)

## 2017-10-18 LAB — BETA 2 MICROGLOBULIN, SERUM: Beta-2 Microglobulin: 1.3 mg/L (ref 0.6–2.4)

## 2017-10-18 LAB — CANCER ANTIGEN 27.29: CA 27.29: 18.5 U/mL (ref 0.0–38.6)

## 2017-10-20 ENCOUNTER — Encounter: Payer: Self-pay | Admitting: Physical Therapy

## 2017-10-20 ENCOUNTER — Ambulatory Visit: Payer: Medicare Other | Admitting: Physical Therapy

## 2017-10-20 ENCOUNTER — Ambulatory Visit: Payer: Medicare Other | Admitting: Speech Pathology

## 2017-10-20 ENCOUNTER — Encounter: Payer: Self-pay | Admitting: Speech Pathology

## 2017-10-20 DIAGNOSIS — R2689 Other abnormalities of gait and mobility: Secondary | ICD-10-CM | POA: Diagnosis not present

## 2017-10-20 DIAGNOSIS — R29818 Other symptoms and signs involving the nervous system: Secondary | ICD-10-CM | POA: Diagnosis not present

## 2017-10-20 DIAGNOSIS — R29898 Other symptoms and signs involving the musculoskeletal system: Secondary | ICD-10-CM | POA: Diagnosis not present

## 2017-10-20 DIAGNOSIS — R471 Dysarthria and anarthria: Secondary | ICD-10-CM

## 2017-10-20 DIAGNOSIS — R2681 Unsteadiness on feet: Secondary | ICD-10-CM

## 2017-10-20 DIAGNOSIS — R1313 Dysphagia, pharyngeal phase: Secondary | ICD-10-CM | POA: Diagnosis not present

## 2017-10-20 NOTE — Patient Instructions (Addendum)
Access Code: [email or web address]  URL: [email or web address]  Date: 10/20/2017  Prepared by: Barry Brunner   Exercises  Single Leg Stance with Support - 3-5 reps - 1 sets - 10-30 seconds hold - 2x daily - 7x weekly  Alternating Step Forward with Support - 10 reps - 1 sets - 1x daily - 5x weekly  Patient Education  What You Can Do to Prevent Falls  Check for Safety  Fall Prevention in the Home Falls can cause injuries and can affect people from all age groups. There are many simple things that you can do to make your home safe and to help prevent falls. What can I do on the outside of my home?  Regularly repair the edges of walkways and driveways and fix any cracks.  Remove high doorway thresholds.  Trim any shrubbery on the main path into your home.  Use bright outdoor lighting.  Clear walkways of debris and clutter, including tools and rocks.  Regularly check that handrails are securely fastened and in good repair. Both sides of any steps should have handrails.  Install guardrails along the edges of any raised decks or porches.  Have leaves, snow, and ice cleared regularly.  Use sand or salt on walkways during winter months.  In the garage, clean up any spills right away, including grease or oil spills. What can I do in the bathroom?  Use night lights.  Install grab bars by the toilet and in the tub and shower. Do not use towel bars as grab bars.  Use non-skid mats or decals on the floor of the tub or shower.  If you need to sit down while you are in the shower, use a plastic, non-slip stool.  Keep the floor dry. Immediately clean up any water that spills on the floor.  Remove soap buildup in the tub or shower on a regular basis.  Attach bath mats securely with double-sided non-slip rug tape.  Remove throw rugs and other tripping hazards from the floor. What can I do in the bedroom?  Use night lights.  Make sure that a bedside light is easy to reach.  Do  not use oversized bedding that drapes onto the floor.  Have a firm chair that has side arms to use for getting dressed.  Remove throw rugs and other tripping hazards from the floor. What can I do in the kitchen?  Clean up any spills right away.  Avoid walking on wet floors.  Place frequently used items in easy-to-reach places.  If you need to reach for something above you, use a sturdy step stool that has a grab bar.  Keep electrical cables out of the way.  Do not use floor polish or wax that makes floors slippery. If you have to use wax, make sure that it is non-skid floor wax.  Remove throw rugs and other tripping hazards from the floor. What can I do in the stairways?  Do not leave any items on the stairs.  Make sure that there are handrails on both sides of the stairs. Fix handrails that are broken or loose. Make sure that handrails are as long as the stairways.  Check any carpeting to make sure that it is firmly attached to the stairs. Fix any carpet that is loose or worn.  Avoid having throw rugs at the top or bottom of stairways, or secure the rugs with carpet tape to prevent them from moving.  Make sure that you have a light switch at the top of  the stairs and the bottom of the stairs. If you do not have them, have them installed. What are some other fall prevention tips?  Wear closed-toe shoes that fit well and support your feet. Wear shoes that have rubber soles or low heels.  When you use a stepladder, make sure that it is completely opened and that the sides are firmly locked. Have someone hold the ladder while you are using it. Do not climb a closed stepladder.  Add color or contrast paint or tape to grab bars and handrails in your home. Place contrasting color strips on the first and last steps.  Use mobility aids as needed, such as canes, walkers, scooters, and crutches.  Turn on lights if it is dark. Replace any light bulbs that burn out.  Set up furniture  so that there are clear paths. Keep the furniture in the same spot.  Fix any uneven floor surfaces.  Choose a carpet design that does not hide the edge of steps of a stairway.  Be aware of any and all pets.  Review your medicines with your healthcare provider. Some medicines can cause dizziness or changes in blood pressure, which increase your risk of falling. Talk with your health care provider about other ways that you can decrease your risk of falls. This may include working with a physical therapist or trainer to improve your strength, balance, and endurance. This information is not intended to replace advice given to you by your health care provider. Make sure you discuss any questions you have with your health care provider. Document Released: 01/01/2002 Document Revised: 06/10/2015 Document Reviewed: 02/15/2014 Elsevier Interactive Patient Education  [employer].

## 2017-10-20 NOTE — Therapy (Signed)
[facility] [address] [place], [name], [address] Phone: [phone]   Fax:  [phone]  Speech Language Pathology Treatment  Patient Details  Name: [name] MRN: [id number] Date of Birth: 1940/05/12 Referring Provider: Alonza Bogus, D.O.   Encounter Date: 10/20/2017  End of Session - 10/20/17 1132    Visit Number  3    Number of Visits  17    Date for SLP Re-Evaluation  12/23/17    SLP Start Time  0846    SLP Stop Time   0928    SLP Time Calculation (min)  42 min    Activity Tolerance  Patient tolerated treatment well       Past Medical History:  Diagnosis Date  . Allergy    SEASONAL  . Anemia    Anemia-NOS / PMH, Dr Julien Nordmann  . Arthritis   . Breast cancer ([place])    stage I right breast  . Cataract    BILATERAL  . Decreased vision    R eye  . Esophageal Stricture   . Hyperlipidemia   . Monoclonal gammopathy   . MVP (mitral valve prolapse)   . Osteoporosis   . Parkinson disease ([place])   . Skin cancer    basal cell L neck  . Vertical diplopia     Past Surgical History:  Procedure Laterality Date  . APPENDECTOMY  2002  . BONE BIOPSY  11/06/10  . BREAST LUMPECTOMY  02/2010   right breast lumpectomy and sentinel node biopsy  . CARDIAC CATHETERIZATION  2005   neg  . CATARACT EXTRACTION, BILATERAL  2018  . CATARACT EXTRACTION, BILATERAL Bilateral 07/2016  . COLONOSCOPY     negative   . HEMORRHOID SURGERY  1970's  . SALPINGOOPHORECTOMY  2002   for benign growths  . SHOULDER SURGERY Right   . TOTAL ABDOMINAL HYSTERECTOMY W/ BILATERAL SALPINGOOPHORECTOMY  77 yrs old   for pain,prolapse ; G3 P2  . UPPER GASTROINTESTINAL ENDOSCOPY      There were no vitals filed for this visit.  Subjective Assessment - 10/20/17 0850    Subjective  "He said I'll have to do it for the rest of the time"    Currently in Pain?  No/denies            ADULT SLP TREATMENT - 10/20/17 0851      General  Information   Behavior/Cognition  Alert;Pleasant mood;Cooperative      Treatment Provided   Treatment provided  Dysphagia      Dysphagia Treatment   Temperature Spikes Noted  No    Respiratory Status  Room air    Treatment Methods  Skilled observation;Therapeutic exercise;Patient/caregiver education    Patient observed directly with PO's  Yes    Type of PO's observed  Thin liquids    Other treatment/comments  Occasional Min cues for HEP for dysphagia today, pt performed Mendelson with min A      Pain Assessment   Pain Assessment  No/denies pain      Cognitive-Linquistic Treatment   Treatment focused on  Dysarthria    Skilled Treatment  Pt enters ST room with volume 68 to 70dB. Loud /a/ to recalibrate volume with usual min modeling average of 87dB (83 to 92dB range). Structured task that involved reading and spontaneous speech with range of 73 to 77dB. Simple conversation average 73dB - range 72 to 75dB with rare min A       Assessment / Recommendations / Plan  Plan  Continue with current plan of care      Dysphagia Recommendations   Diet recommendations  Regular;Thin liquid      Progression Toward Goals   Progression toward goals  Progressing toward goals       SLP Education - 10/20/17 1129    Education Details  Swallow HEP, HEP for dysarthria; compensations for volume in loud environment    Person(s) Educated  Patient    Methods  Explanation;Demonstration;Verbal cues;Handout    Comprehension  Verbalized understanding;Returned demonstration;Verbal cues required;Need further instruction       SLP Short Term Goals - 10/20/17 1131      SLP SHORT TERM GOAL #1   Title  pt will produce loud /a/ at average mid 80s dB over 3 therapy sessions    Time  3    Period  Weeks    Status  On-going      SLP SHORT TERM GOAL #2   Title  pt will increase conversational loudness to average low 70s dB average in 5 minutes simple conversation over three sessions    Time  3    Period   Weeks    Status  On-going      SLP SHORT TERM GOAL #3   Title  pt will improve loudness in 19/20 sentences to average low 70s dB over three sessions    Time  3    Period  Weeks    Status  On-going      SLP SHORT TERM GOAL #4   Title  pt will perform dysphagia HEP with rare min A over three sessions    Time  3    Period  Weeks    Status  On-going       SLP Long Term Goals - 10/20/17 1131      SLP LONG TERM GOAL #1   Title  pt will improve loudness in mod complex conversation of 7 minutes average low 70s dB, over 3 sessions    Time  7    Period  Weeks   or 17 visits, for all LTGs   Status  On-going      SLP LONG TERM GOAL #2   Title  pt will improve loudenss in 7 minutes mod complex-complex conversation over 2 sessions, average 70dB, in mod noisy environment    Time  7    Period  Weeks    Status  On-going      SLP LONG TERM GOAL #3   Title  pt will improve breath support to adequate, in 7 minutes mod complex/complex conversation    Time  7    Period  Weeks    Status  On-going      SLP LONG TERM GOAL #4   Title  pt will complete HEP for dysphagia over three sessions    Time  7    Period  Weeks    Status  On-going      SLP LONG TERM GOAL #5   Title  pt will follow swallow precautions in 3 sessions with POs    Time  7    Period  Weeks    Status  On-going       Plan - 10/20/17 1130    Clinical Impression Statement  Pt arrives today with below WNL volume initially. HEP for dysphagia was completed today with cues occassionaly necessary from SLP. Pt also demo's mild dysarthria c/b reduced breath support and reduced speech volume. Pt's loud /a/ was measured at  upper 80's today occasional min cues for louder production. Pt would cont to benefit from skilled ST focusing on improved swallow skills as well as incr'd loudness across speaking situations.    Speech Therapy Frequency  2x / week    Treatment/Interventions  Aspiration precaution training;Pharyngeal strengthening  exercises;Diet toleration management by SLP;Internal/external aids;Multimodal communcation approach;Patient/family education;Compensatory strategies;Functional tasks;Cueing hierarchy;SLP instruction and feedback;Environmental controls    Potential to Achieve Goals  Good       Patient will benefit from skilled therapeutic intervention in order to improve the following deficits and impairments:   Dysarthria and anarthria  Dysphagia, pharyngeal phase    Problem List Patient Active Problem List   Diagnosis Date Noted  . Pedal edema 10/03/2017  . Right arm pain 02/03/2017  . Recurrent falls 02/03/2017  . Advance care planning 01/23/2017  . Hematuria 07/20/2016  . Headache 05/11/2015  . Globus sensation 04/24/2015  . Foot pain 02/25/2015  . Shoulder pain 04/17/2014  . PD (Parkinson's disease) ([facility]) 06/05/2013  . Medicare annual wellness visit, subsequent 05/02/2012  . Neck pain 05/02/2012  . Osteoporosis 03/08/2011  . Breast cancer ([place] [facility]) 11/19/2010  . GERD (gastroesophageal reflux disease) 08/01/2008  . DYSPHAGIA UNSPECIFIED 08/01/2008  . ORTHOSTATIC HYPOTENSION 06/28/2008  . HLD (hyperlipidemia) 07/10/2007  . MONOCLONAL GAMMOPATHY 07/10/2007  . ANEMIA-NOS 07/10/2007  . FATIGUE 07/10/2007    Lovvorn, Annye Rusk MS,  CCC-SLP 10/20/2017, 11:32 AM  [facility] [address] [place], [name], [address] Phone: [phone]   Fax:  [phone]   Name: YZABELLE CALLES MRN: [id number] Date of Birth: [birth year]

## 2017-10-20 NOTE — Patient Instructions (Signed)
  We use a big breath to generate volume  When someone asks you to repeat yourself take a big breath and think "project my voice"  Continue loud /a/ 5x twice a day   Do reading 10 out loud - think shout!  Be aware of how loud the environment you are talking in, such as restaurants, traffic, music, appliances running, and make an effort to talk over the noise  Continue you swallowing exercises as prescribed by Glendell Docker

## 2017-10-20 NOTE — Therapy (Signed)
[facility] [address] [place], [name], [address] Phone: [phone]   Fax:  [phone]  Physical Therapy Treatment  Patient Details  Name: [name] MRN: [id number] Date of Birth: Jul 10, 1940 Referring Provider (PT): Tat, Wells Guiles   Encounter Date: 10/20/2017  PT End of Session - 10/20/17 1910    Visit Number  6    Number of Visits  17    Date for PT Re-Evaluation  12/22/17    Authorization Type  Medicare and BCBS-will need 10th visit progress note    PT Start Time  0933    PT Stop Time  1018    PT Time Calculation (min)  45 min    Equipment Utilized During Treatment  Gait belt    Activity Tolerance  Patient tolerated treatment well    Behavior During Therapy  [facility] for tasks assessed/performed       Past Medical History:  Diagnosis Date  . Allergy    SEASONAL  . Anemia    Anemia-NOS / PMH, Dr Julien Nordmann  . Arthritis   . Breast cancer ([place])    stage I right breast  . Cataract    BILATERAL  . Decreased vision    R eye  . Esophageal Stricture   . Hyperlipidemia   . Monoclonal gammopathy   . MVP (mitral valve prolapse)   . Osteoporosis   . Parkinson disease ([place])   . Skin cancer    basal cell L neck  . Vertical diplopia     Past Surgical History:  Procedure Laterality Date  . APPENDECTOMY  2002  . BONE BIOPSY  11/06/10  . BREAST LUMPECTOMY  02/2010   right breast lumpectomy and sentinel node biopsy  . CARDIAC CATHETERIZATION  2005   neg  . CATARACT EXTRACTION, BILATERAL  2018  . CATARACT EXTRACTION, BILATERAL Bilateral 07/2016  . COLONOSCOPY     negative   . HEMORRHOID SURGERY  1970's  . SALPINGOOPHORECTOMY  2002   for benign growths  . SHOULDER SURGERY Right   . TOTAL ABDOMINAL HYSTERECTOMY W/ BILATERAL SALPINGOOPHORECTOMY  77 yrs old   for pain,prolapse ; G3 P2  . UPPER GASTROINTESTINAL ENDOSCOPY      There were no vitals filed for this visit.  Subjective Assessment - 10/20/17  0933    Subjective  No changes. No falls or near falls. Reports her balance is improving.     Patient Stated Goals  Pt's goals for therapy are to learn to use a cane.    Currently in Pain?  No/denies         [facility] PT Assessment - 10/20/17 0001      High Level Balance   High Level Balance Comments  MiniBESTest score: 21/28       Mini-BESTest: Balance Evaluation Systems Test  2005-2013 [facility]. All rights reserved. ________________________________________________________________________________________Anticipatory_________Subscore__4__/6 1. SIT TO STAND Instruction: "Cross your arms across your chest. Try not to use your hands unless you must.Do not let your legs lean against the back of the chair when you stand. Please stand up now." x(2) Normal: Comes to stand without use of hands and stabilizes independently. (1) Moderate: Comes to stand WITH use of hands on first attempt. (0) Severe: Unable to stand up from chair without assistance, OR needs several attempts with use of hands. 2. RISE TO TOES Instruction: "Place your feet shoulder width apart. Place your hands on your hips. Try to rise as high as you can onto your  toes. I will count out loud to 3 seconds. Try to hold this pose for at least 3 seconds. Look straight ahead. Rise now." (2) Normal: Stable for 3 s with maximum height. x(1) Moderate: Heels up, but not full range (smaller than when holding hands), OR noticeable instability for 3 s. (0) Severe: < 3 s. 3. STAND ON ONE LEG Instruction: "Look straight ahead. Keep your hands on your hips. Lift your leg off of the ground behind you without touching or resting your raised leg upon your other standing leg. Stay standing on one leg as long as you can. Look straight ahead. Lift now." Left: Time in Seconds Trial 1:___20__Trial 2:_5.06___ (2) Normal: 20 s. (1) Moderate: < 20 s. (0) Severe: Unable. Right: Time in Seconds Trial 1:__2.94___Trial  2:___9.75__ (2) Normal: 20 s. x(1) Moderate: < 20 s. (0) Severe: Unable To score each side separately use the trial with the longest time. To calculate the sub-score and total score use the side [left or right] with the lowest numerical score [i.e. the worse side]. ______________________________________________________________________________________Reactive Postural Control___________Subscore:__6___/6 4. COMPENSATORY STEPPING CORRECTION- FORWARD Instruction: "Stand with your feet shoulder width apart, arms at your sides. Lean forward against my hands beyond your forward limits. When I let go, do whatever is necessary, including taking a step, to avoid a fall." x(2) Normal: Recovers independently with a single, large step (second realignment step is allowed). (1) Moderate: More than one step used to recover equilibrium. (0) Severe: No step, OR would fall if not caught, OR falls spontaneously. 5. COMPENSATORY STEPPING CORRECTION- BACKWARD Instruction: "Stand with your feet shoulder width apart, arms at your sides. Lean backward against my hands beyond your backward limits. When I let go, do whatever is necessary, including taking a step, to avoid a fall." x(2) Normal: Recovers independently with a single, large step. (1) Moderate: More than one step used to recover equilibrium. (0) Severe: No step, OR would fall if not caught, OR falls spontaneously. 6. COMPENSATORY STEPPING CORRECTION- LATERAL Instruction: "Stand with your feet together, arms down at your sides. Lean into my hand beyond your sideways limit. When I let go, do whatever is necessary, including taking a step, to avoid a fall." Left x(2) Normal: Recovers independently with 1 step (crossover or lateral OK). (1) Moderate: Several steps to recover equilibrium. (0) Severe: Falls, or cannot step. Right x(2) Normal: Recovers independently with 1 step (crossover or lateral OK). (1) Moderate: Several steps to recover  equilibrium. (0) Severe: Falls, or cannot step. Use the side with the lowest score to calculate sub-score and total score. ____________________________________________________________________________________Sensory Orientation_____________Subscore:______6___/6 7. STANCE (FEET TOGETHER); EYES OPEN, FIRM SURFACE Instruction: "Place your hands on your hips. Place your feet together until almost touching. Look straight ahead. Be as stable and still as possible, until I say stop." Time in seconds:________ x(2) Normal: 30 s. (1) Moderate: < 30 s. (0) Severe: Unable. 8. STANCE (FEET TOGETHER); EYES CLOSED, FOAM SURFACE Instruction: "Step onto the foam. Place your hands on your hips. Place your feet together until almost touching. Be as stable and still as possible, until I say stop. I will start timing when you close your eyes." Time in seconds:________ x(2) Normal: 30 s. (1) Moderate: < 30 s. (0) Severe: Unable. 9. INCLINE- EYES CLOSED Instruction: "Step onto the incline ramp. Please stand on the incline ramp with your toes toward the top. Place your feet shoulder width apart and have your arms down at your sides. I will start timing when you close your eyes."  Time in seconds:_____30__ x(2) Normal: Stands independently 30 s and aligns with gravity. (1) Moderate: Stands independently <30 s OR aligns with surface. (0) Severe: Unable. _________________________________________________________________________________________Dynamic Gait ______Subscore_____5___/10 10. CHANGE IN GAIT SPEED Instruction: "Begin walking at your normal speed, when I tell you 'fast', walk as fast as you can. When I say 'slow', walk very slowly." x(2) Normal: Significantly changes walking speed without imbalance. (1) Moderate: Unable to change walking speed or signs of imbalance. (0) Severe: Unable to achieve significant change in walking speed AND signs of imbalance. [place] - HORIZONTAL Instruction:  "Begin walking at your normal speed, when I say "right", turn your head and look to the right. When I say "left" turn your head and look to the left. Try to keep yourself walking in a straight line." (2) Normal: performs head turns with no change in gait speed and good balance. x(1) Moderate: performs head turns with reduction in gait speed. (0) Severe: performs head turns with imbalance. 12. WALK WITH PIVOT TURNS Instruction: "Begin walking at your normal speed. When I tell you to 'turn and stop', turn as quickly as you can, face the opposite direction, and stop. After the turn, your feet should be close together." (2) Normal: Turns with feet close FAST (< 3 steps) with good balance. x(1) Moderate: Turns with feet close SLOW (>4 steps) with good balance. (0) Severe: Cannot turn with feet close at any speed without imbalance. 13. STEP OVER OBSTACLES Instruction: "Begin walking at your normal speed. When you get to the box, step over it, not around it and keep walking." (2) Normal: Able to step over box with minimal change of gait speed and with good balance. (1) Moderate: Steps over box but touches box OR displays cautious behavior by slowing gait. x(0) Severe: Unable to step over box OR steps around box. 14. TIMED UP & GO WITH DUAL TASK [3 METER WALK] Instruction TUG: "When I say 'Go', stand up from chair, walk at your normal speed across the tape on the floor, turn around, and come back to sit in the chair." Instruction TUG with Dual Task: "Count backwards by threes starting at ___. When I say 'Go', stand up from chair, walk at your normal speed across the tape on the floor, turn around, and come back to sit in the chair. Continue counting backwards the entire time." TUG: ___11.75_____seconds; Dual Task TUG: ____15.78____seconds (2) Normal: No noticeable change in sitting, standing or walking while backward counting when compared to TUG without Dual Task. x(1) Moderate: Dual Task affects  either counting OR walking (>10%) when compared to the TUG without Dual Task. (0) Severe: Stops counting while walking OR stops walking while counting. When scoring item 14, if subject's gait speed slows more than 10% between the TUG without and with a Dual Task the score should be decreased by a point. TOTAL SCORE: ____21____/28              [facility] Adult PT Treatment/Exercise - 10/20/17 0001      Transfers   Five time sit to stand comments   10.97      Balance (at counter with limited single UE support): Tandem walk forward, backwards walk x 3 each High, slow marching forward, backwards x 3 Sidestepping x 4 Step forward, step backward with same leg (alternating fwd<>backward)        PT Education - 10/20/17 1909    Education Details  results of STGs; fall prevention information; addition to HEP  Person(s) Educated  Patient    Methods  Explanation;Demonstration;Verbal cues;Handout    Comprehension  Verbalized understanding;Returned demonstration;Verbal cues required;Need further instruction       PT Short Term Goals - 10/20/17 1911      PT SHORT TERM GOAL #1   Title  Pt will be independent with HEP for improved posture, balance, gait for improved functional mobility.  TARGET 10/21/17    Time  5    Period  Weeks    Status  Achieved      PT SHORT TERM GOAL #2   Title  Pt will perform 5x sit<>stand with no posterior LOB, in less than or equal to 11.5 seconds for improved transfer efficiency and safety.    Baseline  10/20/17  10.97 sec    Time  5    Period  Weeks    Status  Achieved      PT SHORT TERM GOAL #3   Title  Pt will improve MiniBESTest score to at least 18/28 for decreased fall risk.    Baseline  10/20/17  21/28    Time  5    Period  Weeks    Status  Achieved      PT SHORT TERM GOAL #4   Title  Pt will verbalize understanding of fall prevention in home environment.    Baseline  10/20/17 information provided this date    Time  5    Period  Weeks     Status  On-going      PT SHORT TERM GOAL #5   Title  Pt will ambulate at least 200 ft using appropriate assistive device (cane vs walker) modified independently for improved safety with gait.    Time  5    Period  Weeks    Status  Achieved        PT Long Term Goals - 09/23/17 1707      PT LONG TERM GOAL #1   Title  Pt will verbalize plans for ongoing community fitness upon d/c from PT.  TARGET 11/18/17    Time  9    Period  Weeks    Status  New    Target Date  11/18/17      PT LONG TERM GOAL #2   Title  Pt will perform at least 8 of 10 reps of sit<>stand from less than 18 inch surfaces, with no posterior lean for improved transfer efficiency and safety.    Time  9    Period  Weeks    Status  New    Target Date  11/18/17      PT LONG TERM GOAL #3   Title  Pt will improve MiniBESTest score to at least 23/28 for decreased fall risk.    Time  9    Period  Weeks    Status  New    Target Date  11/18/17      PT LONG TERM GOAL #4   Title  Pt will demo <2 steps to recover balance in posterior push and release test for improved balance recovery in posterior direction.    Time  9    Period  Weeks    Status  New    Target Date  11/18/17      PT LONG TERM GOAL #5   Title  Pt will ambulate at least 1000 ft with appropriate assistive device, modified independently for improved community gait negotiation.    Time  9    Period  Weeks  Status  New    Target Date  11/18/17            Plan - 10/20/17 1906    Clinical Impression Statement  Patient met 4 of 5 STGs with final goal ongoing as education handouts provided today. Her balance reactions during push/release portion of Mini-BEST test were significantly improved. She had no LOB today that required outside physical assist to recover. Recommend attempt use of walking pole(s) if pt interested.     Rehab Potential  Good    Clinical Impairments Affecting Rehab Potential  Motivated for therapy; has current exercise routine     PT Frequency  2x / week   1x/wk for 1 week, then   PT Duration  8 weeks   9 weeks total POC   PT Treatment/Interventions  ADLs/Self Care Home Management;Gait training;Functional mobility training;Therapeutic activities;Therapeutic exercise;DME Instruction;Balance training;Neuromuscular re-education;Patient/family education    PT Next Visit Plan  add to HEP (?standing hip abduction, ??safe to walk bkwards with one hand on counter or wall, ?wall bumps); ?try walking pole(s) as her balance has improved (if she is interested--she is doing walking program holding onto her boyfriend's arm)    PT Home Exercise Plan  [id number]    Consulted and Agree with Plan of Care  Patient       Patient will benefit from skilled therapeutic intervention in order to improve the following deficits and impairments:  Abnormal gait, Decreased balance, Decreased mobility, Decreased strength, Difficulty walking, Postural dysfunction  Visit Diagnosis: Unsteadiness on feet  Other abnormalities of gait and mobility  Other symptoms and signs involving the nervous system     Problem List Patient Active Problem List   Diagnosis Date Noted  . Pedal edema 10/03/2017  . Right arm pain 02/03/2017  . Recurrent falls 02/03/2017  . Advance care planning 01/23/2017  . Hematuria 07/20/2016  . Headache 05/11/2015  . Globus sensation 04/24/2015  . Foot pain 02/25/2015  . Shoulder pain 04/17/2014  . PD (Parkinson's disease) ([place]) 06/05/2013  . Medicare annual wellness visit, subsequent 05/02/2012  . Neck pain 05/02/2012  . Osteoporosis 03/08/2011  . Breast cancer ([place]) 11/19/2010  . GERD (gastroesophageal reflux disease) 08/01/2008  . DYSPHAGIA UNSPECIFIED 08/01/2008  . ORTHOSTATIC HYPOTENSION 06/28/2008  . HLD (hyperlipidemia) 07/10/2007  . MONOCLONAL GAMMOPATHY 07/10/2007  . ANEMIA-NOS 07/10/2007  . FATIGUE 07/10/2007    Rexanne Mano, PT 10/20/2017, 7:28 PM  [facility] [address] [place], [name], [address] Phone: [phone]   Fax:  [phone]  Name: MICAL BRUN MRN: [id number] Date of Birth: 05-13-1940

## 2017-10-25 ENCOUNTER — Ambulatory Visit: Payer: Medicare Other | Admitting: Neurology

## 2017-10-26 ENCOUNTER — Ambulatory Visit: Payer: Medicare Other | Attending: Family Medicine | Admitting: Physical Therapy

## 2017-10-26 ENCOUNTER — Encounter: Payer: Self-pay | Admitting: Physical Therapy

## 2017-10-26 ENCOUNTER — Ambulatory Visit: Payer: Medicare Other

## 2017-10-26 DIAGNOSIS — R2689 Other abnormalities of gait and mobility: Secondary | ICD-10-CM | POA: Insufficient documentation

## 2017-10-26 DIAGNOSIS — R471 Dysarthria and anarthria: Secondary | ICD-10-CM | POA: Insufficient documentation

## 2017-10-26 DIAGNOSIS — R2681 Unsteadiness on feet: Secondary | ICD-10-CM

## 2017-10-26 DIAGNOSIS — R29818 Other symptoms and signs involving the nervous system: Secondary | ICD-10-CM | POA: Insufficient documentation

## 2017-10-26 DIAGNOSIS — R1313 Dysphagia, pharyngeal phase: Secondary | ICD-10-CM

## 2017-10-26 NOTE — Patient Instructions (Signed)
Access Code: [email or web address]  URL: [email or web address]  Date: 10/26/2017  Prepared by: Mady Haagensen   Exercises  Sidestepping - 15 reps - 2 sets - 1x daily - 5x weekly  Alternating Step Backward with Support - 15 reps - 2 sets - 1x daily - 5x weekly

## 2017-10-26 NOTE — Therapy (Signed)
[facility] [address] [place], [name], [address] Phone: [phone]   Fax:  [phone]  Speech Language Pathology Treatment  Patient Details  Name: [name] MRN: [id number] Date of Birth: 1940/06/17 Referring Provider (SLP): Tat, Wells Guiles, D.O.   Encounter Date: 10/26/2017  End of Session - 10/26/17 1148    Visit Number  4    Number of Visits  17    Date for SLP Re-Evaluation  12/23/17    SLP Start Time  1103    SLP Stop Time   1144    SLP Time Calculation (min)  41 min    Activity Tolerance  Patient tolerated treatment well       Past Medical History:  Diagnosis Date  . Allergy    SEASONAL  . Anemia    Anemia-NOS / PMH, Dr Julien Nordmann  . Arthritis   . Breast cancer ([place])    stage I right breast  . Cataract    BILATERAL  . Decreased vision    R eye  . Esophageal Stricture   . Hyperlipidemia   . Monoclonal gammopathy   . MVP (mitral valve prolapse)   . Osteoporosis   . Parkinson disease ([place])   . Skin cancer    basal cell L neck  . Vertical diplopia     Past Surgical History:  Procedure Laterality Date  . APPENDECTOMY  2002  . BONE BIOPSY  11/06/10  . BREAST LUMPECTOMY  02/2010   right breast lumpectomy and sentinel node biopsy  . CARDIAC CATHETERIZATION  2005   neg  . CATARACT EXTRACTION, BILATERAL  2018  . CATARACT EXTRACTION, BILATERAL Bilateral 07/2016  . COLONOSCOPY     negative   . HEMORRHOID SURGERY  1970's  . SALPINGOOPHORECTOMY  2002   for benign growths  . SHOULDER SURGERY Right   . TOTAL ABDOMINAL HYSTERECTOMY W/ BILATERAL SALPINGOOPHORECTOMY  77 yrs old   for pain,prolapse ; G3 P2  . UPPER GASTROINTESTINAL ENDOSCOPY      There were no vitals filed for this visit.  Subjective Assessment - 10/26/17 1108    Subjective  "My daughter got married this weekend."    Currently in Pain?  Yes    Pain Score  0-No pain   "in my back, but only when I stand up"            ADULT SLP TREATMENT - 10/26/17 1114      General Information   Behavior/Cognition  Alert;Pleasant mood;Cooperative      Treatment Provided   Treatment provided  Dysphagia      Dysphagia Treatment   Temperature Spikes Noted  No    Respiratory Status  Room air    Treatment Methods  Skilled observation;Therapeutic exercise;Patient/caregiver education    Patient observed directly with PO's  Yes    Type of PO's observed  Thin liquids    Pharyngeal Phase Signs & Symptoms  --   none noted today   Other treatment/comments  Occasional min A necessary for swallowing HEP today - gauze, MEndelsohn, and chin tuck against resistance (chin tuck with thumbs under chin).      Cognitive-Linquistic Treatment   Treatment focused on  Dysarthria    Skilled Treatment  Excellent success both in ST room and outdoors with intelligibilty. In the speech room, min-mod complex conversation for 8 minutes with average low 70s dB, and outdoors pt maintained intelligible speech with motor noise (MRI truck) and traffic/can noise [employer] traffic, and cars running  in parking lot).       Assessment / Recommendations / Plan   Plan  Continue with current plan of care      Dysphagia Recommendations   Diet recommendations  Regular;Thin liquid       SLP Education - 10/26/17 1147    Education Details  HEP procedure    Person(s) Educated  Patient    Methods  Explanation;Demonstration;Verbal cues    Comprehension  Verbalized understanding;Verbal cues required;Returned demonstration;Need further instruction       SLP Short Term Goals - 10/26/17 1149      SLP SHORT TERM GOAL #1   Title  pt will produce loud /a/ at average mid 80s dB over 3 therapy sessions    Baseline  10-26-17    Time  2    Period  Weeks    Status  On-going      SLP SHORT TERM GOAL #2   Title  pt will increase conversational loudness to average low 70s dB average in 5 minutes simple conversation over three sessions    Baseline   10-26-17    Time  2    Period  Weeks    Status  On-going      SLP SHORT TERM GOAL #3   Title  pt will improve loudness in 19/20 sentences to average low 70s dB over three sessions    Baseline  10-26-17    Time  2    Period  Weeks    Status  On-going      SLP SHORT TERM GOAL #4   Title  pt will perform dysphagia HEP with rare min A over three sessions    Time  2    Period  Weeks    Status  On-going       SLP Long Term Goals - 10/26/17 1150      SLP LONG TERM GOAL #1   Title  pt will improve loudness in mod complex conversation of 7 minutes average low 70s dB, over 3 sessions    Time  6    Period  Weeks   or 17 visits, for all LTGs   Status  On-going      SLP LONG TERM GOAL #2   Title  pt will improve loudenss in 7 minutes mod complex-complex conversation over 2 sessions, average 70dB, in mod noisy environment    Time  6    Period  Weeks    Status  On-going      SLP LONG TERM GOAL #3   Title  pt will improve breath support to adequate, in 7 minutes mod complex/complex conversation    Time  6    Period  Weeks    Status  On-going      SLP LONG TERM GOAL #4   Title  pt will complete HEP for dysphagia over three sessions    Time  6    Period  Weeks    Status  On-going      SLP LONG TERM GOAL #5   Title  pt will follow swallow precautions in 3 sessions with POs    Time  6    Period  Weeks    Status  On-going       Plan - 10/26/17 1148    Clinical Impression Statement  Pt presents today with mostly-WNL volume in simple-mod complex conversation both in [facility] room and outdoors. HEP for dysphagia was completed today with min cues occassionaly necessary from SLP. Pt would  cont to benefit from skilled ST focusing on improved swallow skills as well as incr'd loudness across speaking situations.    Speech Therapy Frequency  2x / week    Duration  --   8 weeks/17 visits   Treatment/Interventions  [place]iration precaution training;Pharyngeal strengthening exercises;Diet toleration  management by SLP;Internal/external aids;Multimodal communcation approach;Patient/family education;Compensatory strategies;Functional tasks;Cueing hierarchy;SLP instruction and feedback;Environmental controls    Potential to Achieve Goals  Good       Patient will benefit from skilled therapeutic intervention in order to improve the following deficits and impairments:   Dysarthria and anarthria  Dysphagia, pharyngeal phase    Problem List Patient Active Problem List   Diagnosis Date Noted  . Pedal edema 10/03/2017  . Right arm pain 02/03/2017  . Recurrent falls 02/03/2017  . Advance care planning 01/23/2017  . Hematuria 07/20/2016  . Headache 05/11/2015  . Globus sensation 04/24/2015  . Foot pain 02/25/2015  . Shoulder pain 04/17/2014  . PD (Parkinson's disease) ([place]) 06/05/2013  . Medicare annual wellness visit, subsequent 05/02/2012  . Neck pain 05/02/2012  . Osteoporosis 03/08/2011  . Breast cancer ([place]) 11/19/2010  . GERD (gastroesophageal reflux disease) 08/01/2008  . DYSPHAGIA UNSPECIFIED 08/01/2008  . ORTHOSTATIC HYPOTENSION 06/28/2008  . HLD (hyperlipidemia) 07/10/2007  . MONOCLONAL GAMMOPATHY 07/10/2007  . ANEMIA-NOS 07/10/2007  . FATIGUE 07/10/2007    SCHINKE,CARL ,MS, [place]  10/26/2017, 11:51 AM  [facility] [address] [place], [name], [address] Phone: [phone]   Fax:  [phone]   Name: [name] MRN: [id number] Date of Birth: 1940/04/02

## 2017-10-27 NOTE — Therapy (Signed)
[facility] [address] [place], [name], [address] Phone: [phone]   Fax:  [phone]  Physical Therapy Treatment  Patient Details  Name: [name] MRN: [id number] Date of Birth: 1940-03-05 Referring Provider (PT): Tat, Wells Guiles   Encounter Date: 10/26/2017  PT End of Session - 10/27/17 1250    Visit Number  7    Number of Visits  17    Date for PT Re-Evaluation  12/22/17    Authorization Type  Medicare and BCBS-will need 10th visit progress note    PT Start Time  1018    PT Stop Time  1100    PT Time Calculation (min)  42 min    Activity Tolerance  Patient tolerated treatment well    Behavior During Therapy  [facility] for tasks assessed/performed       Past Medical History:  Diagnosis Date  . Allergy    SEASONAL  . Anemia    Anemia-NOS / PMH, Dr Julien Nordmann  . Arthritis   . Breast cancer ([place])    stage I right breast  . Cataract    BILATERAL  . Decreased vision    R eye  . Esophageal Stricture   . Hyperlipidemia   . Monoclonal gammopathy   . MVP (mitral valve prolapse)   . Osteoporosis   . Parkinson disease ([place])   . Skin cancer    basal cell L neck  . Vertical diplopia     Past Surgical History:  Procedure Laterality Date  . APPENDECTOMY  2002  . BONE BIOPSY  11/06/10  . BREAST LUMPECTOMY  02/2010   right breast lumpectomy and sentinel node biopsy  . CARDIAC CATHETERIZATION  2005   neg  . CATARACT EXTRACTION, BILATERAL  2018  . CATARACT EXTRACTION, BILATERAL Bilateral 07/2016  . COLONOSCOPY     negative   . HEMORRHOID SURGERY  1970's  . SALPINGOOPHORECTOMY  2002   for benign growths  . SHOULDER SURGERY Right   . TOTAL ABDOMINAL HYSTERECTOMY W/ BILATERAL SALPINGOOPHORECTOMY  77 yrs old   for pain,prolapse ; G3 P2  . UPPER GASTROINTESTINAL ENDOSCOPY      There were no vitals filed for this visit.  Subjective Assessment - 10/26/17 1021    Subjective  My daughter got married over  the weekend.  No problems, no falls at the wedding.    Patient Stated Goals  Pt's goals for therapy are to learn to use a cane.    Currently in Pain?  Yes    Pain Score  8     Pain Location  Back    Pain Orientation  Lower    Pain Descriptors / Indicators  Aching    Pain Type  Chronic pain    Pain Frequency  Intermittent    Aggravating Factors   walking    Pain Relieving Factors  Ibuprofen                            Balance Exercises - 10/26/17 1035      Balance Exercises: Standing   Wall Bumps  Hip    Wall Bumps-Hips  Eyes opened;Anterior/posterior;10 reps    Stepping Strategy  Anterior;Posterior;Lateral;UE support   10 reps forward, 15 reps side, posterior   Retro Gait  Upper extremity support;5 reps   Forward/back at counter   Sidestepping  Upper extremity support;2 reps   then progressed to sidestep suats for strength, control   Heel  Raises Limitations  Heel/toe raises x 10 reps    Other Standing Exercises  Standing on [employer]:  forward step taps x 15 reps, then back step taps x 15 reps, then side step taps x 15 reps, for stepping strategy on compliant surface-cues for deliberate foot clearance.  Progressed forward/back walking at counter to quick stop/starts in posterior direction-cues for foot placement for stagger stance position for improved ability for weightshifting.      Cues for slowed pace of exercises, widened BOS and deliberate foot placement  PT Education - 10/27/17 1250    Education Details  Addition to HEP-see instructions    Person(s) Educated  Patient    Methods  Explanation;Demonstration;Handout    Comprehension  Verbalized understanding;Returned demonstration;Verbal cues required       PT Short Term Goals - 10/20/17 1911      PT SHORT TERM GOAL #1   Title  Pt will be independent with HEP for improved posture, balance, gait for improved functional mobility.  TARGET 10/21/17    Time  5    Period  Weeks    Status  Achieved      PT  SHORT TERM GOAL #2   Title  Pt will perform 5x sit<>stand with no posterior LOB, in less than or equal to 11.5 seconds for improved transfer efficiency and safety.    Baseline  10/20/17  10.97 sec    Time  5    Period  Weeks    Status  Achieved      PT SHORT TERM GOAL #3   Title  Pt will improve MiniBESTest score to at least 18/28 for decreased fall risk.    Baseline  10/20/17  21/28    Time  5    Period  Weeks    Status  Achieved      PT SHORT TERM GOAL #4   Title  Pt will verbalize understanding of fall prevention in home environment.    Baseline  10/20/17 information provided this date    Time  5    Period  Weeks    Status  On-going      PT SHORT TERM GOAL #5   Title  Pt will ambulate at least 200 ft using appropriate assistive device (cane vs walker) modified independently for improved safety with gait.    Time  5    Period  Weeks    Status  Achieved        PT Long Term Goals - 09/23/17 1707      PT LONG TERM GOAL #1   Title  Pt will verbalize plans for ongoing community fitness upon d/c from PT.  TARGET 11/18/17    Time  9    Period  Weeks    Status  New    Target Date  11/18/17      PT LONG TERM GOAL #2   Title  Pt will perform at least 8 of 10 reps of sit<>stand from less than 18 inch surfaces, with no posterior lean for improved transfer efficiency and safety.    Time  9    Period  Weeks    Status  New    Target Date  11/18/17      PT LONG TERM GOAL #3   Title  Pt will improve MiniBESTest score to at least 23/28 for decreased fall risk.    Time  9    Period  Weeks    Status  New    Target Date  11/18/17      PT LONG TERM GOAL #4   Title  Pt will demo <2 steps to recover balance in posterior push and release test for improved balance recovery in posterior direction.    Time  9    Period  Weeks    Status  New    Target Date  11/18/17      PT LONG TERM GOAL #5   Title  Pt will ambulate at least 1000 ft with appropriate assistive device, modified  independently for improved community gait negotiation.    Time  9    Period  Weeks    Status  New    Target Date  11/18/17            Plan - 10/27/17 1253    Clinical Impression Statement  Skilled PT session today focused on hip, ankle, stepping strategies on solid and compliant sufaces, with updates made to HEP to add side and posterior step strategies to HEP.  Pt performs most standing exercise with UE support and cues needed for widened BOS.  Pt will continue to benefit from skilled PT to address balance, posture, and gait towards LTGs.    Rehab Potential  Good    Clinical Impairments Affecting Rehab Potential  Motivated for therapy; has current exercise routine    PT Frequency  2x / week   1x/wk for 1 week, then   PT Duration  8 weeks   9 weeks total POC   PT Treatment/Interventions  ADLs/Self Care Home Management;Gait training;Functional mobility training;Therapeutic activities;Therapeutic exercise;DME Instruction;Balance training;Neuromuscular re-education;Patient/family education    PT Next Visit Plan  Check HEP additions given this visit; also consider added standing hip abduction to HEP, backwards walking at counter, wal bumps; consider walking poles for her walking program at home.    PT Home Exercise Plan  [id number]    Consulted and Agree with Plan of Care  Patient       Patient will benefit from skilled therapeutic intervention in order to improve the following deficits and impairments:  Abnormal gait, Decreased balance, Decreased mobility, Decreased strength, Difficulty walking, Postural dysfunction  Visit Diagnosis: Unsteadiness on feet  Other abnormalities of gait and mobility     Problem List Patient Active Problem List   Diagnosis Date Noted  . Pedal edema 10/03/2017  . Right arm pain 02/03/2017  . Recurrent falls 02/03/2017  . Advance care planning 01/23/2017  . Hematuria 07/20/2016  . Headache 05/11/2015  . Globus sensation 04/24/2015  . Foot pain  02/25/2015  . Shoulder pain 04/17/2014  . PD (Parkinson's disease) ([place]) 06/05/2013  . Medicare annual wellness visit, subsequent 05/02/2012  . Neck pain 05/02/2012  . Osteoporosis 03/08/2011  . Breast cancer ([place]) 11/19/2010  . GERD (gastroesophageal reflux disease) 08/01/2008  . DYSPHAGIA UNSPECIFIED 08/01/2008  . ORTHOSTATIC HYPOTENSION 06/28/2008  . HLD (hyperlipidemia) 07/10/2007  . MONOCLONAL GAMMOPATHY 07/10/2007  . ANEMIA-NOS 07/10/2007  . FATIGUE 07/10/2007    MARRIOTT,AMY W. 10/27/2017, 12:56 PM  Frazier Butt., PT   [facility] [address] [place], [name], [address] Phone: [phone]   Fax:  [phone]  Name: [name] MRN: [id number] Date of Birth: Jun 02, 1940

## 2017-10-28 ENCOUNTER — Encounter: Payer: Self-pay | Admitting: Physical Therapy

## 2017-10-28 ENCOUNTER — Ambulatory Visit: Payer: Medicare Other | Admitting: Physical Therapy

## 2017-10-28 ENCOUNTER — Ambulatory Visit: Payer: Medicare Other

## 2017-10-28 DIAGNOSIS — R2681 Unsteadiness on feet: Secondary | ICD-10-CM

## 2017-10-28 DIAGNOSIS — R471 Dysarthria and anarthria: Secondary | ICD-10-CM

## 2017-10-28 DIAGNOSIS — R1313 Dysphagia, pharyngeal phase: Secondary | ICD-10-CM

## 2017-10-28 DIAGNOSIS — R29818 Other symptoms and signs involving the nervous system: Secondary | ICD-10-CM | POA: Diagnosis not present

## 2017-10-28 DIAGNOSIS — R2689 Other abnormalities of gait and mobility: Secondary | ICD-10-CM | POA: Diagnosis not present

## 2017-10-28 NOTE — Therapy (Signed)
[facility] [address] [place], [name], [address] Phone: [phone]   Fax:  [phone]  Speech Language Pathology Treatment  Patient Details  Name: [name] MRN: [id number] Date of Birth: [birth year] Referring Provider (SLP): Tat, Wells Guiles, D.O.   Encounter Date: 10/28/2017  End of Session - 10/28/17 1320    Visit Number  5    Number of Visits  17    Date for SLP Re-Evaluation  12/23/17    SLP Start Time  1105    SLP Stop Time   1145    SLP Time Calculation (min)  40 min    Activity Tolerance  Patient tolerated treatment well       Past Medical History:  Diagnosis Date  . Allergy    SEASONAL  . Anemia    Anemia-NOS / PMH, Dr Julien Nordmann  . Arthritis   . Breast cancer ([place])    stage I right breast  . Cataract    BILATERAL  . Decreased vision    R eye  . Esophageal Stricture   . Hyperlipidemia   . Monoclonal gammopathy   . MVP (mitral valve prolapse)   . Osteoporosis   . Parkinson disease ([place])   . Skin cancer    basal cell L neck  . Vertical diplopia     Past Surgical History:  Procedure Laterality Date  . APPENDECTOMY  2002  . BONE BIOPSY  11/06/10  . BREAST LUMPECTOMY  02/2010   right breast lumpectomy and sentinel node biopsy  . CARDIAC CATHETERIZATION  2005   neg  . CATARACT EXTRACTION, BILATERAL  2018  . CATARACT EXTRACTION, BILATERAL Bilateral 07/2016  . COLONOSCOPY     negative   . HEMORRHOID SURGERY  1970's  . SALPINGOOPHORECTOMY  2002   for benign growths  . SHOULDER SURGERY Right   . TOTAL ABDOMINAL HYSTERECTOMY W/ BILATERAL SALPINGOOPHORECTOMY  77 yrs old   for pain,prolapse ; G3 P2  . UPPER GASTROINTESTINAL ENDOSCOPY      There were no vitals filed for this visit.  Subjective Assessment - 10/28/17 1112    Subjective  "I'm having back pain today."    Currently in Pain?  Yes    Pain Score  6     Pain Location  Back    Pain Orientation  Lower    Pain Descriptors /  Indicators  Aching    Pain Type  Chronic pain    Pain Onset  More than a month ago    Pain Frequency  Intermittent    Aggravating Factors   walking     Pain Relieving Factors  meds            ADULT SLP TREATMENT - 10/28/17 1119      General Information   Behavior/Cognition  Alert;Pleasant mood;Cooperative      Treatment Provided   Treatment provided  Cognitive-Linquistic      Cognitive-Linquistic Treatment   Treatment focused on  Dysarthria    Skilled Treatment  Excellent success in ST room and outside [facility] room (in the clinic) however pt stated, "I have to keep it on my brain or I'll start talking low." In the speech room, min-mod complex conversation for 5 minutes with average low 70s dB, and out of the speech room in the gym (mod noisy), pt maintained WNL volume for 25 minutes. Consider x1/week if pt's success persists.      Assessment / Recommendations / Plan   Plan  Continue with  current plan of care      Progression Toward Goals   Progression toward goals  Progressing toward goals         SLP Short Term Goals - 10/28/17 1143      SLP SHORT TERM GOAL #1   Title  pt will produce loud /a/ at average mid 80s dB over 3 therapy sessions    Baseline  10-26-17    Time  2    Period  Weeks    Status  On-going      SLP SHORT TERM GOAL #2   Title  pt will increase conversational loudness to average low 70s dB average in 5 minutes simple conversation over three sessions    Baseline  10-26-17, 10-28-17    Time  2    Period  Weeks    Status  On-going      SLP SHORT TERM GOAL #3   Title  pt will improve loudness in 19/20 sentences to average low 70s dB over three sessions    Status  Achieved      SLP SHORT TERM GOAL #4   Title  pt will perform dysphagia HEP with rare min A over three sessions    Time  2    Period  Weeks    Status  On-going       SLP Long Term Goals - 10/28/17 1144      SLP LONG TERM GOAL #1   Title  pt will improve loudness in mod complex  conversation of 7 minutes average low 70s dB, over 3 sessions    Baseline  10-28-17    Time  6    Period  Weeks   or 17 visits, for all LTGs   Status  On-going      SLP LONG TERM GOAL #2   Title  pt will improve loudenss in 7 minutes mod complex-complex conversation over 2 sessions, average 70dB, in mod noisy environment    Time  6    Period  Weeks    Status  On-going      SLP LONG TERM GOAL #3   Title  pt will improve breath support to adequate, in 7 minutes mod complex/complex conversation    Time  6    Period  Weeks    Status  On-going      SLP LONG TERM GOAL #4   Title  pt will complete HEP for dysphagia over three sessions    Time  6    Period  Weeks    Status  On-going      SLP LONG TERM GOAL #5   Title  pt will follow swallow precautions in 3 sessions with POs    Time  6    Period  Weeks    Status  On-going       Plan - 10/28/17 1321    Clinical Impression Statement  Pt presents today with mostly-WNL volume in mod complex conversation both in [facility] room and inthe gym in mod noisy environment.  Consider x1/week if pt progress with dysarthria/loudness persists. Pt would cont to benefit from skilled ST focusing on improved swallow skills as well as incr'd loudness across speaking situations.    Speech Therapy Frequency  2x / week    Duration  --   8 weeks/17 visits   Treatment/Interventions  Aspiration precaution training;Pharyngeal strengthening exercises;Diet toleration management by SLP;Internal/external aids;Multimodal communcation approach;Patient/family education;Compensatory strategies;Functional tasks;Cueing hierarchy;SLP instruction and feedback;Environmental controls    Potential to Achieve  Goals  Good       Patient will benefit from skilled therapeutic intervention in order to improve the following deficits and impairments:   Dysarthria and anarthria  Dysphagia, pharyngeal phase    Problem List Patient Active Problem List   Diagnosis Date Noted  . Pedal  edema 10/03/2017  . Right arm pain 02/03/2017  . Recurrent falls 02/03/2017  . Advance care planning 01/23/2017  . Hematuria 07/20/2016  . Headache 05/11/2015  . Globus sensation 04/24/2015  . Foot pain 02/25/2015  . Shoulder pain 04/17/2014  . PD (Parkinson's disease) ([place]) 06/05/2013  . Medicare annual wellness visit, subsequent 05/02/2012  . Neck pain 05/02/2012  . Osteoporosis 03/08/2011  . Breast cancer ([place]) 11/19/2010  . GERD (gastroesophageal reflux disease) 08/01/2008  . DYSPHAGIA UNSPECIFIED 08/01/2008  . ORTHOSTATIC HYPOTENSION 06/28/2008  . HLD (hyperlipidemia) 07/10/2007  . MONOCLONAL GAMMOPATHY 07/10/2007  . ANEMIA-NOS 07/10/2007  . FATIGUE 07/10/2007    Buelah Rennie ,MS, CCC-SLP  10/28/2017, 1:22 PM  [facility] [address] [place], [name], [address] Phone: [phone]   Fax:  [phone]   Name: [name] MRN: [id number] Date of Birth: 1940/04/07

## 2017-10-28 NOTE — Patient Instructions (Addendum)
Weight Shift: Anterior / Posterior (Righting / Equilibrium)-WALL BUMPS    Stand in front of the wall.  Slowly shift weight forward, arms back and hips forward over toes. Return to starting position. Shift weight backward, arms forward and hips back over heels. Hold each position __3__ seconds. Repeat ___10_ times per session. Do __1-2__ sessions per day.  Copyright  VHI. All rights reserved.  Backward    Stand beside the counter and hold on.  Walk backwards with eyes open. Take even steps, making sure each foot lifts off floor.  Then walk forward to end of counter. Repeat for _3-5___ times along the counter. Do 1-2____ sessions per day.   Copyright  VHI. All rights reserved.

## 2017-10-30 ENCOUNTER — Encounter: Payer: Self-pay | Admitting: Physical Therapy

## 2017-10-30 NOTE — Therapy (Signed)
[facility] [address] [place], [name], [address] Phone: [phone]   Fax:  [phone]  Physical Therapy Treatment  Patient Details  Name: [name] MRN: [id number] Date of Birth: Jan 10, 1941 Referring Provider (PT): Tat, Wells Guiles   Encounter Date: 10/28/2017  PT End of Session - 10/30/17 2131    Visit Number  8    Number of Visits  17    Date for PT Re-Evaluation  12/22/17    Authorization Type  Medicare and BCBS-will need 10th visit progress note    PT Start Time  1152    PT Stop Time  1231    PT Time Calculation (min)  39 min    Activity Tolerance  Patient tolerated treatment well    Behavior During Therapy  [facility] for tasks assessed/performed       Past Medical History:  Diagnosis Date  . Allergy    SEASONAL  . Anemia    Anemia-NOS / PMH, Dr Julien Nordmann  . Arthritis   . Breast cancer ([place])    stage I right breast  . Cataract    BILATERAL  . Decreased vision    R eye  . Esophageal Stricture   . Hyperlipidemia   . Monoclonal gammopathy   . MVP (mitral valve prolapse)   . Osteoporosis   . Parkinson disease ([place])   . Skin cancer    basal cell L neck  . Vertical diplopia     Past Surgical History:  Procedure Laterality Date  . APPENDECTOMY  2002  . BONE BIOPSY  11/06/10  . BREAST LUMPECTOMY  02/2010   right breast lumpectomy and sentinel node biopsy  . CARDIAC CATHETERIZATION  2005   neg  . CATARACT EXTRACTION, BILATERAL  2018  . CATARACT EXTRACTION, BILATERAL Bilateral 07/2016  . COLONOSCOPY     negative   . HEMORRHOID SURGERY  1970's  . SALPINGOOPHORECTOMY  2002   for benign growths  . SHOULDER SURGERY Right   . TOTAL ABDOMINAL HYSTERECTOMY W/ BILATERAL SALPINGOOPHORECTOMY  77 yrs old   for pain,prolapse ; G3 P2  . UPPER GASTROINTESTINAL ENDOSCOPY      There were no vitals filed for this visit.  Subjective Assessment - 10/30/17 2124    Subjective  Back is hurting today.  Comes  and goes.  No falls since last visit.    Patient Stated Goals  Pt's goals for therapy are to learn to use a cane.    Currently in Pain?  Yes    Pain Score  6     Pain Location  Back    Pain Orientation  Mid;Lower    Pain Descriptors / Indicators  Aching    Pain Type  Chronic pain    Pain Onset  More than a month ago    Pain Frequency  Intermittent    Aggravating Factors   walking    Pain Relieving Factors  medication                       [facility] Adult PT Treatment/Exercise - 10/30/17 0001      Ambulation/Gait   Ambulation/Gait  Yes    Ambulation/Gait Assistance  5: Supervision    Ambulation/Gait Assistance Details  Gait activities today with HHA (simulating how patient walks with boyfriend at mall daily)    Ambulation Distance (Feet)  400 Feet   x 2   Assistive device  None    Gait Pattern  Step-through pattern;Decreased arm  swing - left;Decreased step length - right;Decreased trunk rotation;Narrow base of support    Gait Comments  Cues for upright posture, widened BOS, tried tactile cues for arm swing, but difficult to carryover, as she doesn't want to "slow down" her boyfriend, Tom, while he is walking for exercise.       Reviewed HEP from last visit, with pt return demo understanding.  With turns, cues to take smaller steps to avoid stepping too far out of base of support.   Balance Exercises - 10/30/17 2126      Balance Exercises: Standing   Wall Bumps  Hip    Wall Bumps-Hips  Eyes opened;Anterior/posterior;10 reps   2 sets   Stepping Strategy  Anterior;Posterior;Lateral;UE support    Retro Gait  Upper extremity support;5 reps   Forward/back walking at counter   Sidestepping  Upper extremity support;2 reps    Turning  Right;Left;5 reps   90 degree step-turns, R and L    Heel Raises Limitations  Heel/toe raises x 10 reps        PT Education - 10/30/17 2131    Education Details  Additions to HEP-see instructions    Person(s) Educated  Patient     Methods  Explanation;Demonstration;Handout    Comprehension  Verbalized understanding;Returned demonstration;Verbal cues required       PT Short Term Goals - 10/20/17 1911      PT SHORT TERM GOAL #1   Title  Pt will be independent with HEP for improved posture, balance, gait for improved functional mobility.  TARGET 10/21/17    Time  5    Period  Weeks    Status  Achieved      PT SHORT TERM GOAL #2   Title  Pt will perform 5x sit<>stand with no posterior LOB, in less than or equal to 11.5 seconds for improved transfer efficiency and safety.    Baseline  10/20/17  10.97 sec    Time  5    Period  Weeks    Status  Achieved      PT SHORT TERM GOAL #3   Title  Pt will improve MiniBESTest score to at least 18/28 for decreased fall risk.    Baseline  10/20/17  21/28    Time  5    Period  Weeks    Status  Achieved      PT SHORT TERM GOAL #4   Title  Pt will verbalize understanding of fall prevention in home environment.    Baseline  10/20/17 information provided this date    Time  5    Period  Weeks    Status  On-going      PT SHORT TERM GOAL #5   Title  Pt will ambulate at least 200 ft using appropriate assistive device (cane vs walker) modified independently for improved safety with gait.    Time  5    Period  Weeks    Status  Achieved        PT Long Term Goals - 09/23/17 1707      PT LONG TERM GOAL #1   Title  Pt will verbalize plans for ongoing community fitness upon d/c from PT.  TARGET 11/18/17    Time  9    Period  Weeks    Status  New    Target Date  11/18/17      PT LONG TERM GOAL #2   Title  Pt will perform at least 8 of 10 reps of sit<>stand  from less than 18 inch surfaces, with no posterior lean for improved transfer efficiency and safety.    Time  9    Period  Weeks    Status  New    Target Date  11/18/17      PT LONG TERM GOAL #3   Title  Pt will improve MiniBESTest score to at least 23/28 for decreased fall risk.    Time  9    Period  Weeks    Status   New    Target Date  11/18/17      PT LONG TERM GOAL #4   Title  Pt will demo <2 steps to recover balance in posterior push and release test for improved balance recovery in posterior direction.    Time  9    Period  Weeks    Status  New    Target Date  11/18/17      PT LONG TERM GOAL #5   Title  Pt will ambulate at least 1000 ft with appropriate assistive device, modified independently for improved community gait negotiation.    Time  9    Period  Weeks    Status  New    Target Date  11/18/17            Plan - 10/30/17 2131    Clinical Impression Statement  Continuing to work on transitions from forward/back directions and with turns.  Pt needs cues for widened BOS for gait, but with turns, needs cues to avoid stepping too far that creates being off-balance.  Did not use walking poles today, but simulated walking with arm-hold assistance, as she does with her boyfriend, with patient needing cues for widened BOS and trying to increase reciprocal arm swing for balance.  Pt will continue to benefit from skilled PT to address balance, posture and gait.    Rehab Potential  Good    Clinical Impairments Affecting Rehab Potential  Motivated for therapy; has current exercise routine    PT Frequency  2x / week   1x/wk for 1 week, then   PT Duration  8 weeks   9 weeks total POC   PT Treatment/Interventions  ADLs/Self Care Home Management;Gait training;Functional mobility training;Therapeutic activities;Therapeutic exercise;DME Instruction;Balance training;Neuromuscular re-education;Patient/family education    PT Next Visit Plan  Check HEP additions given this visit; consider trying walking poles for her walking program at home.  Continue to work on turns, changes of directions with gait.    PT Home Exercise Plan  [id number]    Consulted and Agree with Plan of Care  Patient       Patient will benefit from skilled therapeutic intervention in order to improve the following deficits and  impairments:  Abnormal gait, Decreased balance, Decreased mobility, Decreased strength, Difficulty walking, Postural dysfunction  Visit Diagnosis: Unsteadiness on feet  Other abnormalities of gait and mobility     Problem List Patient Active Problem List   Diagnosis Date Noted  . Pedal edema 10/03/2017  . Right arm pain 02/03/2017  . Recurrent falls 02/03/2017  . Advance care planning 01/23/2017  . Hematuria 07/20/2016  . Headache 05/11/2015  . Globus sensation 04/24/2015  . Foot pain 02/25/2015  . Shoulder pain 04/17/2014  . PD (Parkinson's disease) ([place]) 06/05/2013  . Medicare annual wellness visit, subsequent 05/02/2012  . Neck pain 05/02/2012  . Osteoporosis 03/08/2011  . Breast cancer ([place]) 11/19/2010  . GERD (gastroesophageal reflux disease) 08/01/2008  . DYSPHAGIA UNSPECIFIED 08/01/2008  . ORTHOSTATIC HYPOTENSION 06/28/2008  .  HLD (hyperlipidemia) 07/10/2007  . MONOCLONAL GAMMOPATHY 07/10/2007  . ANEMIA-NOS 07/10/2007  . FATIGUE 07/10/2007    Cj Edgell W. 10/30/2017, 9:35 PM  Frazier Butt., PT   [facility] [address] [place], [name], [address] Phone: [phone]   Fax:  [phone]  Name: KYNADIE YAUN MRN: [id number] Date of Birth: 08/15/1940

## 2017-11-01 ENCOUNTER — Ambulatory Visit: Payer: Medicare Other

## 2017-11-01 ENCOUNTER — Encounter: Payer: Self-pay | Admitting: Physical Therapy

## 2017-11-01 ENCOUNTER — Ambulatory Visit: Payer: Medicare Other | Admitting: Physical Therapy

## 2017-11-01 DIAGNOSIS — R471 Dysarthria and anarthria: Secondary | ICD-10-CM | POA: Diagnosis not present

## 2017-11-01 DIAGNOSIS — R1313 Dysphagia, pharyngeal phase: Secondary | ICD-10-CM

## 2017-11-01 DIAGNOSIS — R29818 Other symptoms and signs involving the nervous system: Secondary | ICD-10-CM | POA: Diagnosis not present

## 2017-11-01 DIAGNOSIS — R2681 Unsteadiness on feet: Secondary | ICD-10-CM | POA: Diagnosis not present

## 2017-11-01 DIAGNOSIS — R2689 Other abnormalities of gait and mobility: Secondary | ICD-10-CM

## 2017-11-01 NOTE — Patient Instructions (Signed)
  Stop doing the exercise standing in the corner with eyes closed.   Stop doing side stepping.    Turning in Place: Solid Surface    Standing in place with back towards a wall and chair back in front of you. (You may use your cane if you feel more comfortable with cane.) Turn to your right leading first with right leg turn slowly 4 times. Once facing forward, rest until not dizzy and then do 4 turns to your left leading with left leg. Repeat __1__ times per session. Do __2__ sessions per day.   Copyright  VHI. All rights reserved.

## 2017-11-01 NOTE — Therapy (Signed)
[facility] [address] [place], [name], [address] Phone: [phone]   Fax:  [phone]  Speech Language Pathology Treatment  Patient Details  Name: [name] MRN: [id number] Date of Birth: [birth year] Referring Provider (SLP): Tat, Wells Guiles, D.O.   Encounter Date: 11/01/2017  End of Session - 11/01/17 1643    Visit Number  6    Number of Visits  17    Date for SLP Re-Evaluation  12/23/17    SLP Start Time  1404    SLP Stop Time   1445    SLP Time Calculation (min)  41 min    Activity Tolerance  Patient tolerated treatment well       Past Medical History:  Diagnosis Date  . Allergy    SEASONAL  . Anemia    Anemia-NOS / PMH, Dr Julien Nordmann  . Arthritis   . Breast cancer ([place])    stage I right breast  . Cataract    BILATERAL  . Decreased vision    R eye  . Esophageal Stricture   . Hyperlipidemia   . Monoclonal gammopathy   . MVP (mitral valve prolapse)   . Osteoporosis   . Parkinson disease ([place])   . Skin cancer    basal cell L neck  . Vertical diplopia     Past Surgical History:  Procedure Laterality Date  . APPENDECTOMY  2002  . BONE BIOPSY  11/06/10  . BREAST LUMPECTOMY  02/2010   right breast lumpectomy and sentinel node biopsy  . CARDIAC CATHETERIZATION  2005   neg  . CATARACT EXTRACTION, BILATERAL  2018  . CATARACT EXTRACTION, BILATERAL Bilateral 07/2016  . COLONOSCOPY     negative   . HEMORRHOID SURGERY  1970's  . SALPINGOOPHORECTOMY  2002   for benign growths  . SHOULDER SURGERY Right   . TOTAL ABDOMINAL HYSTERECTOMY W/ BILATERAL SALPINGOOPHORECTOMY  77 yrs old   for pain,prolapse ; G3 P2  . UPPER GASTROINTESTINAL ENDOSCOPY      There were no vitals filed for this visit.  Subjective Assessment - 11/01/17 1409    Subjective  "(Gershon Mussel is) the only one who does it now." (pt, re: people asking her to repeat)    Currently in Pain?  No/denies            ADULT SLP TREATMENT  - 11/01/17 1412      General Information   Behavior/Cognition  Alert;Pleasant mood;Cooperative      Treatment Provided   Treatment provided  Cognitive-Linquistic;Dysphagia      Dysphagia Treatment   Temperature Spikes Noted  No    Respiratory Status  Room air    Oral Cavity - Dentition  Adequate natural dentition    Treatment Methods  Therapeutic exercise    Patient observed directly with PO's  Yes    Type of PO's observed  Thin liquids    Oral Phase Signs & Symptoms  --   none noted   Pharyngeal Phase Signs & Symptoms  --   none noted today   Other treatment/comments  Rare min A necessary for swallow HEP - mostly with Mendelsohn.       Cognitive-Linquistic Treatment   Treatment focused on  Dysarthria    Skilled Treatment  Pt with incr'd amplitude and frequency of labial and mandibular tremor today than in previous sessions. However, again, pt with excellent success in ST room and outside [place] room (in the clinic) with loudness WNL. Mod noisy environment (gym), pt  maintained WNL volume for 20 minutes.      Assessment / Recommendations / Plan   Plan  --   x1 week due to progress     Dysphagia Recommendations   Diet recommendations  Regular;Thin liquid      Progression Toward Goals   Progression toward goals  Progressing toward goals         SLP Short Term Goals - 11/01/17 1644      SLP SHORT TERM GOAL #1   Title  pt will produce loud /a/ at average mid 80s dB over 3 therapy sessions    Baseline  10-26-17, 11-01-17    Time  1    Period  Weeks    Status  On-going      SLP SHORT TERM GOAL #2   Title  pt will increase conversational loudness to average low 70s dB average in 5 minutes simple conversation over three sessions    Status  Achieved      SLP SHORT TERM GOAL #3   Title  pt will improve loudness in 19/20 sentences to average low 70s dB over three sessions    Status  Achieved      SLP SHORT TERM GOAL #4   Title  pt will perform dysphagia HEP with rare min A over  three sessions    Baseline  11-01-17    Time  1    Period  Weeks    Status  Partially Met   1/3 sessions      SLP Long Term Goals - 11/01/17 1645      SLP LONG TERM GOAL #1   Title  pt will improve loudness in mod complex conversation of 7 minutes average low 70s dB, over 3 sessions    Baseline  10-28-17, 11-01-17    Time  5    Period  Weeks   or 17 visits, for all LTGs   Status  On-going      SLP LONG TERM GOAL #2   Title  pt will improve loudenss in 7 minutes mod complex-complex conversation over 2 sessions, average 70dB, in mod noisy environment    Time  5    Period  Weeks    Status  On-going      SLP LONG TERM GOAL #3   Title  pt will improve breath support to adequate, in 7 minutes mod complex/complex conversation    Time  5    Period  Weeks    Status  On-going      SLP LONG TERM GOAL #4   Title  pt will complete HEP for dysphagia over three sessions    Time  5    Period  Weeks    Status  On-going      SLP LONG TERM GOAL #5   Title  pt will follow swallow precautions in 3 sessions with POs    Time  5    Period  Weeks    Status  On-going       Plan - 11/01/17 1643    Clinical Impression Statement  Pt req'd rare min A with swallow HEP today, and presents again with mostly-WNL volume in mod complex conversation both in [place] room and in the gym in mod noisy environment.  x1/week appropriate at this time and pt agrees. Pt would cont to benefit from skilled ST focusing on improved swallow skills as well as incr'd loudness across speaking situations. Consider d/c if progress consistent in next 2-3 sessions.  Speech Therapy Frequency  2x / week    Duration  --   8 weeks/17 visits   Treatment/Interventions  Aspiration precaution training;Pharyngeal strengthening exercises;Diet toleration management by SLP;Internal/external aids;Multimodal communcation approach;Patient/family education;Compensatory strategies;Functional tasks;Cueing hierarchy;SLP instruction and  feedback;Environmental controls    Potential to Achieve Goals  Good       Patient will benefit from skilled therapeutic intervention in order to improve the following deficits and impairments:   Dysarthria and anarthria  Dysphagia, pharyngeal phase    Problem List Patient Active Problem List   Diagnosis Date Noted  . Pedal edema 10/03/2017  . Right arm pain 02/03/2017  . Recurrent falls 02/03/2017  . Advance care planning 01/23/2017  . Hematuria 07/20/2016  . Headache 05/11/2015  . Globus sensation 04/24/2015  . Foot pain 02/25/2015  . Shoulder pain 04/17/2014  . PD (Parkinson's disease) ([place]) 06/05/2013  . Medicare annual wellness visit, subsequent 05/02/2012  . Neck pain 05/02/2012  . Osteoporosis 03/08/2011  . Breast cancer ([place]) 11/19/2010  . GERD (gastroesophageal reflux disease) 08/01/2008  . DYSPHAGIA UNSPECIFIED 08/01/2008  . ORTHOSTATIC HYPOTENSION 06/28/2008  . HLD (hyperlipidemia) 07/10/2007  . MONOCLONAL GAMMOPATHY 07/10/2007  . ANEMIA-NOS 07/10/2007  . FATIGUE 07/10/2007    Bernon Arviso ,[place], CCC-SLP  11/01/2017, 4:46 PM  [facility] [address] [place], [name], [address] Phone: [phone]   Fax:  [phone]   Name: [name] MRN: [id number] Date of Birth: [birth year]

## 2017-11-02 NOTE — Therapy (Signed)
[facility] [address] [place], [name], [address] Phone: [phone]   Fax:  [phone]  Physical Therapy Treatment  Patient Details  Name: [name] MRN: [id number] Date of Birth: [birth year] Referring Provider (PT): Tat, Wells Guiles   Encounter Date: 11/01/2017  PT End of Session - 11/01/17 1739    Visit Number  8    Number of Visits  17    Date for PT Re-Evaluation  12/22/17    Authorization Type  Medicare and BCBS-will need 10th visit progress note    PT Start Time  1315    PT Stop Time  1358    PT Time Calculation (min)  43 min    Activity Tolerance  Patient tolerated treatment well    Behavior During Therapy  [facility] for tasks assessed/performed       Past Medical History:  Diagnosis Date  . Allergy    SEASONAL  . Anemia    Anemia-NOS / PMH, Dr Julien Nordmann  . Arthritis   . Breast cancer ([place])    stage I right breast  . Cataract    BILATERAL  . Decreased vision    R eye  . Esophageal Stricture   . Hyperlipidemia   . Monoclonal gammopathy   . MVP (mitral valve prolapse)   . Osteoporosis   . Parkinson disease ([place])   . Skin cancer    basal cell L neck  . Vertical diplopia     Past Surgical History:  Procedure Laterality Date  . APPENDECTOMY  2002  . BONE BIOPSY  11/06/10  . BREAST LUMPECTOMY  02/2010   right breast lumpectomy and sentinel node biopsy  . CARDIAC CATHETERIZATION  2005   neg  . CATARACT EXTRACTION, BILATERAL  2018  . CATARACT EXTRACTION, BILATERAL Bilateral 07/2016  . COLONOSCOPY     negative   . HEMORRHOID SURGERY  1970's  . SALPINGOOPHORECTOMY  2002   for benign growths  . SHOULDER SURGERY Right   . TOTAL ABDOMINAL HYSTERECTOMY W/ BILATERAL SALPINGOOPHORECTOMY  77 yrs old   for pain,prolapse ; G3 P2  . UPPER GASTROINTESTINAL ENDOSCOPY      There were no vitals filed for this visit.  Subjective Assessment - 11/01/17 1318    Subjective  took something for her back  earlier today and feeling better now.     Patient Stated Goals  Pt's goals for therapy are to learn to use a cane.    Currently in Pain?  No/denies    Pain Onset  More than a month ago                       [facility] Adult PT Treatment/Exercise - 11/01/17 1741      Ambulation/Gait   Ambulation/Gait Assistance  6: Modified independent (Device/Increase time);4: Min guard    Ambulation/Gait Assistance Details  modified independent with rollator; close guarding with cane (pt reports uses inside at home); slightly unsteady as evidenced by drifting when using cane (esp with head turns or full body turns    Ambulation Distance (Feet)  120 Feet   x 3   Assistive device  Rollator;Straight cane    Gait Pattern  Step-through pattern;Decreased arm swing - left;Decreased step length - right;Decreased trunk rotation;Narrow base of support    Ambulation Surface  Indoor;Level          Balance Exercises - 11/01/17 1732      Balance Exercises: Standing   Tandem Stance  Eyes  open;Intermittent upper extremity support;2 reps    SLS  Eyes open;Solid surface;2 reps   >20  sec each leg   Wall Bumps  Hip    Wall Bumps-Hips  Eyes opened;Anterior/posterior;10 reps    Step Ups  6 inch;Intermittent UE support    Retro Gait  Upper extremity support;4 reps    Sidestepping  2 reps   no UE support   Turning  Both   4 reps   Other Standing Exercises  at counter with light to no UE support: walking with toes raised, with heels raised for challenging balance and ankle strengthening        PT Education - 11/02/17 0750    Education Details  excellent progress and discussion when to discharge from PT    Person(s) Educated  Patient    Methods  Explanation    Comprehension  Verbalized understanding       PT Short Term Goals - 10/20/17 1911      PT SHORT TERM GOAL #1   Title  Pt will be independent with HEP for improved posture, balance, gait for improved functional mobility.  TARGET 10/21/17     Time  5    Period  Weeks    Status  Achieved      PT SHORT TERM GOAL #2   Title  Pt will perform 5x sit<>stand with no posterior LOB, in less than or equal to 11.5 seconds for improved transfer efficiency and safety.    Baseline  10/20/17  10.97 sec    Time  5    Period  Weeks    Status  Achieved      PT SHORT TERM GOAL #3   Title  Pt will improve MiniBESTest score to at least 18/28 for decreased fall risk.    Baseline  10/20/17  21/28    Time  5    Period  Weeks    Status  Achieved      PT SHORT TERM GOAL #4   Title  Pt will verbalize understanding of fall prevention in home environment.    Baseline  10/20/17 information provided this date    Time  5    Period  Weeks    Status  On-going      PT SHORT TERM GOAL #5   Title  Pt will ambulate at least 200 ft using appropriate assistive device (cane vs walker) modified independently for improved safety with gait.    Time  5    Period  Weeks    Status  Achieved        PT Long Term Goals - 09/23/17 1707      PT LONG TERM GOAL #1   Title  Pt will verbalize plans for ongoing community fitness upon d/c from PT.  TARGET 11/18/17    Time  9    Period  Weeks    Status  New    Target Date  11/18/17      PT LONG TERM GOAL #2   Title  Pt will perform at least 8 of 10 reps of sit<>stand from less than 18 inch surfaces, with no posterior lean for improved transfer efficiency and safety.    Time  9    Period  Weeks    Status  New    Target Date  11/18/17      PT LONG TERM GOAL #3   Title  Pt will improve MiniBESTest score to at least 23/28 for decreased fall risk.  Time  9    Period  Weeks    Status  New    Target Date  11/18/17      PT LONG TERM GOAL #4   Title  Pt will demo <2 steps to recover balance in posterior push and release test for improved balance recovery in posterior direction.    Time  9    Period  Weeks    Status  New    Target Date  11/18/17      PT LONG TERM GOAL #5   Title  Pt will ambulate at least  1000 ft with appropriate assistive device, modified independently for improved community gait negotiation.    Time  9    Period  Weeks    Status  New    Target Date  11/18/17            Plan - 11/02/17 0752    Clinical Impression Statement  Patient making excellent progress. Reviewed HEP and removed exercises that have become too easy for her. Continued to work on changes in direction with walking and specifically broke down turns and leading with the foot to the side which she is turning. Added turns to her HEP. Discussed anticipate discharge in next 1-2 sessions and pt in agreement. Want to first make sure she is independent with HEP and fully updated.     Rehab Potential  Good    Clinical Impairments Affecting Rehab Potential  Motivated for therapy; has current exercise routine    PT Frequency  2x / week   1x/wk for 1 week, then   PT Duration  8 weeks   9 weeks total POC   PT Treatment/Interventions  ADLs/Self Care Home Management;Gait training;Functional mobility training;Therapeutic activities;Therapeutic exercise;DME Instruction;Balance training;Neuromuscular re-education;Patient/family education    PT Next Visit Plan  See if any further questions re: HEP--esp with turns added 10/8; check LTGs and likely discharge (unless reason arises to be addressed)    PT Home Exercise Plan  [id number]    Consulted and Agree with Plan of Care  Patient       Patient will benefit from skilled therapeutic intervention in order to improve the following deficits and impairments:  Abnormal gait, Decreased balance, Decreased mobility, Decreased strength, Difficulty walking, Postural dysfunction  Visit Diagnosis: Unsteadiness on feet  Other abnormalities of gait and mobility  Other symptoms and signs involving the nervous system     Problem List Patient Active Problem List   Diagnosis Date Noted  . Pedal edema 10/03/2017  . Right arm pain 02/03/2017  . Recurrent falls 02/03/2017  .  Advance care planning 01/23/2017  . Hematuria 07/20/2016  . Headache 05/11/2015  . Globus sensation 04/24/2015  . Foot pain 02/25/2015  . Shoulder pain 04/17/2014  . PD (Parkinson's disease) ([place]) 06/05/2013  . Medicare annual wellness visit, subsequent 05/02/2012  . Neck pain 05/02/2012  . Osteoporosis 03/08/2011  . Breast cancer ([place]) 11/19/2010  . GERD (gastroesophageal reflux disease) 08/01/2008  . DYSPHAGIA UNSPECIFIED 08/01/2008  . ORTHOSTATIC HYPOTENSION 06/28/2008  . HLD (hyperlipidemia) 07/10/2007  . MONOCLONAL GAMMOPATHY 07/10/2007  . ANEMIA-NOS 07/10/2007  . FATIGUE 07/10/2007    Rexanne Mano, PT 11/02/2017, 5:47 PM  [facility] [address] [place], [name], [address] Phone: [phone]   Fax:  [phone]  Name: EMBERLI BALLESTER MRN: [id number] Date of Birth: September 21, 1940

## 2017-11-03 ENCOUNTER — Ambulatory Visit: Payer: Medicare Other | Admitting: Speech Pathology

## 2017-11-03 ENCOUNTER — Encounter: Payer: Self-pay | Admitting: Physical Therapy

## 2017-11-03 ENCOUNTER — Ambulatory Visit: Payer: Medicare Other | Admitting: Physical Therapy

## 2017-11-03 DIAGNOSIS — R471 Dysarthria and anarthria: Secondary | ICD-10-CM | POA: Diagnosis not present

## 2017-11-03 DIAGNOSIS — R2689 Other abnormalities of gait and mobility: Secondary | ICD-10-CM

## 2017-11-03 DIAGNOSIS — R29818 Other symptoms and signs involving the nervous system: Secondary | ICD-10-CM | POA: Diagnosis not present

## 2017-11-03 DIAGNOSIS — R1313 Dysphagia, pharyngeal phase: Secondary | ICD-10-CM | POA: Diagnosis not present

## 2017-11-03 DIAGNOSIS — R2681 Unsteadiness on feet: Secondary | ICD-10-CM | POA: Diagnosis not present

## 2017-11-03 NOTE — Therapy (Signed)
[facility] [address] [place], [name], [address] Phone: [phone]   Fax:  [phone]  Physical Therapy Treatment/Discharge Summary  Patient Details  Name: [name] MRN: [id number] Date of Birth: 10/30/1940 Referring Provider (PT): Tat, Wells Guiles   Encounter Date: 11/03/2017  PT End of Session - 11/03/17 1848    Visit Number  10   Previous visit should have been visit 9   Number of Visits  17    Date for PT Re-Evaluation  12/22/17    Authorization Type  Medicare and BCBS-will need 10th visit progress note    PT Start Time  0933    PT Stop Time  1013    PT Time Calculation (min)  40 min    Activity Tolerance  Patient tolerated treatment well    Behavior During Therapy  [facility] for tasks assessed/performed       Past Medical History:  Diagnosis Date  . Allergy    SEASONAL  . Anemia    Anemia-NOS / PMH, Dr Julien Nordmann  . Arthritis   . Breast cancer ([place])    stage I right breast  . Cataract    BILATERAL  . Decreased vision    R eye  . Esophageal Stricture   . Hyperlipidemia   . Monoclonal gammopathy   . MVP (mitral valve prolapse)   . Osteoporosis   . Parkinson disease ([place])   . Skin cancer    basal cell L neck  . Vertical diplopia     Past Surgical History:  Procedure Laterality Date  . APPENDECTOMY  2002  . BONE BIOPSY  11/06/10  . BREAST LUMPECTOMY  02/2010   right breast lumpectomy and sentinel node biopsy  . CARDIAC CATHETERIZATION  2005   neg  . CATARACT EXTRACTION, BILATERAL  2018  . CATARACT EXTRACTION, BILATERAL Bilateral 07/2016  . COLONOSCOPY     negative   . HEMORRHOID SURGERY  1970's  . SALPINGOOPHORECTOMY  2002   for benign growths  . SHOULDER SURGERY Right   . TOTAL ABDOMINAL HYSTERECTOMY W/ BILATERAL SALPINGOOPHORECTOMY  77 yrs old   for pain,prolapse ; G3 P2  . UPPER GASTROINTESTINAL ENDOSCOPY      There were no vitals filed for this visit.  Subjective Assessment -  11/03/17 0937    Subjective  Yes, I have back pain-I have so much pain, I don't usually think about it.     Patient Stated Goals  Pt's goals for therapy are to learn to use a cane.    Currently in Pain?  Yes    Pain Score  5     Pain Location  Back    Pain Orientation  Mid;Lower    Pain Descriptors / Indicators  Aching    Pain Type  Chronic pain    Pain Onset  More than a month ago    Aggravating Factors   walking    Pain Relieving Factors  medication                       [facility] Adult PT Treatment/Exercise - 11/03/17 0001      Transfers   Transfers  Sit to Stand;Stand to Sit    Sit to Stand  6: Modified independent (Device/Increase time);Without upper extremity assist    Five time sit to stand comments   10.57    Stand to Sit  6: Modified independent (Device/Increase time);Without upper extremity assist;To chair/3-in-1    Number of Reps  10 reps;2 sets   from 18", then from 16" surfaces, no posterior lean     Ambulation/Gait   Ambulation/Gait  Yes    Ambulation/Gait Assistance  6: Modified independent (Device/Increase time);4: Min guard    Ambulation/Gait Assistance Details  Modified independent with rollator, close guarding with no device for MiniBESTest today    Ambulation Distance (Feet)  120 Feet   100 with rollator   Assistive device  Rollator;None    Gait Pattern  Step-through pattern;Decreased arm swing - left;Decreased step length - right;Decreased trunk rotation;Narrow base of support    Ambulation Surface  Level;Indoor    Gait velocity  2.87 ft/sec (no device)    Pre-Gait Activities  Pt reports walking daily >500 ft either with her boyfriend, Gershon Mussel, or with her rollator.    Gait Comments  Reviewed turns from last visit, given as HEP; pt return demo understanding      High Level Balance   High Level Balance Comments  MiniBESTest score:  25/28 (See flow sheet for details)      Self-Care   Self-Care  Other Self-Care Comments    Other Self-Care Comments    Discussed POC and progress towards goals.  Discussed based on pt's progress, plans for d/c this visit.  Discussed return screen versus PD eval, and pt opts for PT, OT, speech evals (scheduled today).  Discussed need to continue to use rollator for long distance and outdoor walking, but with decreased fall risk, she is okay to walk without device in home.        Mini-BESTest: Balance Evaluation Systems Test  2005-2013 [employer]. All rights reserved. ________________________________________________________________________________________Anticipatory_________Subscore__5___/6 1. SIT TO STAND Instruction: "Cross your arms across your chest. Try not to use your hands unless you must.Do not let your legs lean against the back of the chair when you stand. Please stand up now." X(2) Normal: Comes to stand without use of hands and stabilizes independently. (1) Moderate: Comes to stand WITH use of hands on first attempt. (0) Severe: Unable to stand up from chair without assistance, OR needs several attempts with use of hands. 2. RISE TO TOES Instruction: "Place your feet shoulder width apart. Place your hands on your hips. Try to rise as high as you can onto your toes. I will count out loud to 3 seconds. Try to hold this pose for at least 3 seconds. Look straight ahead. Rise now." X(2) Normal: Stable for 3 s with maximum height. (1) Moderate: Heels up, but not full range (smaller than when holding hands), OR noticeable instability for 3 s. (0) Severe: < 3 s. 3. STAND ON ONE LEG Instruction: "Look straight ahead. Keep your hands on your hips. Lift your leg off of the ground behind you without touching or resting your raised leg upon your other standing leg. Stay standing on one leg as long as you can. Look straight ahead. Lift now." Left: Time in Seconds Trial 1:__3.44___Trial 2:___19.44__ (2) Normal: 20 s. X(1) Moderate: < 20 s. (0) Severe: Unable. Right: Time in Seconds  Trial 1:___4.03__Trial 2:__20___ X(2) Normal: 20 s. (1) Moderate: < 20 s. (0) Severe: Unable To score each side separately use the trial with the longest time. To calculate the sub-score and total score use the side [left or right] with the lowest numerical score [i.e. the worse side]. ______________________________________________________________________________________Reactive Postural Control___________Subscore:____6_/6 4. COMPENSATORY STEPPING CORRECTION- FORWARD Instruction: "Stand with your feet shoulder width apart, arms at your sides. Lean forward against my hands beyond your forward  limits. When I let go, do whatever is necessary, including taking a step, to avoid a fall." X(2) Normal: Recovers independently with a single, large step (second realignment step is allowed). (1) Moderate: More than one step used to recover equilibrium. (0) Severe: No step, OR would fall if not caught, OR falls spontaneously. 5. COMPENSATORY STEPPING CORRECTION- BACKWARD Instruction: "Stand with your feet shoulder width apart, arms at your sides. Lean backward against my hands beyond your backward limits. When I let go, do whatever is necessary, including taking a step, to avoid a fall." X(2) Normal: Recovers independently with a single, large step. (1) Moderate: More than one step used to recover equilibrium. (0) Severe: No step, OR would fall if not caught, OR falls spontaneously. 6. COMPENSATORY STEPPING CORRECTION- LATERAL Instruction: "Stand with your feet together, arms down at your sides. Lean into my hand beyond your sideways limit. When I let go, do whatever is necessary, including taking a step, to avoid a fall." Left X(2) Normal: Recovers independently with 1 step (crossover or lateral OK). (1) Moderate: Several steps to recover equilibrium. (0) Severe: Falls, or cannot step. Right X(2) Normal: Recovers independently with 1 step (crossover or lateral OK). (1) Moderate: Several steps to  recover equilibrium. (0) Severe: Falls, or cannot step. Use the side with the lowest score to calculate sub-score and total score. ____________________________________________________________________________________Sensory Orientation_____________Subscore:_______6__/6 7. STANCE (FEET TOGETHER); EYES OPEN, FIRM SURFACE Instruction: "Place your hands on your hips. Place your feet together until almost touching. Look straight ahead. Be as stable and still as possible, until I say stop." Time in seconds:________ X(2) Normal: 30 s. (1) Moderate: < 30 s. (0) Severe: Unable. 8. STANCE (FEET TOGETHER); EYES CLOSED, FOAM SURFACE Instruction: "Step onto the foam. Place your hands on your hips. Place your feet together until almost touching. Be as stable and still as possible, until I say stop. I will start timing when you close your eyes." Time in seconds:________ X(2) Normal: 30 s. (1) Moderate: < 30 s. (0) Severe: Unable. 9. INCLINE- EYES CLOSED Instruction: "Step onto the incline ramp. Please stand on the incline ramp with your toes toward the top. Place your feet shoulder width apart and have your arms down at your sides. I will start timing when you close your eyes." Time in seconds:________ X(2) Normal: Stands independently 30 s and aligns with gravity. (1) Moderate: Stands independently <30 s OR aligns with surface. (0) Severe: Unable. _________________________________________________________________________________________Dynamic Gait ______Subscore_____8___/10 10. CHANGE IN GAIT SPEED Instruction: "Begin walking at your normal speed, when I tell you 'fast', walk as fast as you can. When I say 'slow', walk very slowly." X(2) Normal: Significantly changes walking speed without imbalance. (1) Moderate: Unable to change walking speed or signs of imbalance. (0) Severe: Unable to achieve significant change in walking speed AND signs of imbalance. [place] -  HORIZONTAL Instruction: "Begin walking at your normal speed, when I say "right", turn your head and look to the right. When I say "left" turn your head and look to the left. Try to keep yourself walking in a straight line." X(2) Normal: performs head turns with no change in gait speed and good balance. (1) Moderate: performs head turns with reduction in gait speed. (0) Severe: performs head turns with imbalance. 12. WALK WITH PIVOT TURNS Instruction: "Begin walking at your normal speed. When I tell you to 'turn and stop', turn as quickly as you can, face the opposite direction, and stop. After the turn, your feet should  be close together." (2) Normal: Turns with feet close FAST (< 3 steps) with good balance. X(1) Moderate: Turns with feet close SLOW (>4 steps) with good balance. (0) Severe: Cannot turn with feet close at any speed without imbalance. 13. STEP OVER OBSTACLES Instruction: "Begin walking at your normal speed. When you get to the box, step over it, not around it and keep walking." (2) Normal: Able to step over box with minimal change of gait speed and with good balance. X(1) Moderate: Steps over box but touches box OR displays cautious behavior by slowing gait. (0) Severe: Unable to step over box OR steps around box. 14. TIMED UP & GO WITH DUAL TASK [3 METER WALK] Instruction TUG: "When I say 'Go', stand up from chair, walk at your normal speed across the tape on the floor, turn around, and come back to sit in the chair." Instruction TUG with Dual Task: "Count backwards by threes starting at ___. When I say 'Go', stand up from chair, walk at your normal speed across the tape on the floor, turn around, and come back to sit in the chair. Continue counting backwards the entire time." TUG: ___10.82_____seconds; Dual Task TUG: ____11.03____seconds (2) Normal: No noticeable change in sitting, standing or walking while backward counting when compared to TUG without Dual Task. (1)  Moderate: Dual Task affects either counting OR walking (>10%) when compared to the TUG without Dual Task. (0) Severe: Stops counting while walking OR stops walking while counting. When scoring item 14, if subject's gait speed slows more than 10% between the TUG without and with a Dual Task the score should be decreased by a point. TOTAL SCORE: ___25_____/28       PT Education - 11/03/17 1847    Education Details  Progress with PT, POC and plans for d/c; pt plans to return to gym for aerobic machines; plan for return to PD evals in 6-7 months    Person(s) Educated  Patient    Methods  Explanation    Comprehension  Verbalized understanding       PT Short Term Goals - 10/20/17 1911      PT SHORT TERM GOAL #1   Title  Pt will be independent with HEP for improved posture, balance, gait for improved functional mobility.  TARGET 10/21/17    Time  5    Period  Weeks    Status  Achieved      PT SHORT TERM GOAL #2   Title  Pt will perform 5x sit<>stand with no posterior LOB, in less than or equal to 11.5 seconds for improved transfer efficiency and safety.    Baseline  10/20/17  10.97 sec    Time  5    Period  Weeks    Status  Achieved      PT SHORT TERM GOAL #3   Title  Pt will improve MiniBESTest score to at least 18/28 for decreased fall risk.    Baseline  10/20/17  21/28    Time  5    Period  Weeks    Status  Achieved      PT SHORT TERM GOAL #4   Title  Pt will verbalize understanding of fall prevention in home environment.    Baseline  10/20/17 information provided this date    Time  5    Period  Weeks    Status  On-going      PT SHORT TERM GOAL #5   Title  Pt will ambulate at least  200 ft using appropriate assistive device (cane vs walker) modified independently for improved safety with gait.    Time  5    Period  Weeks    Status  Achieved        PT Long Term Goals - 11/03/17 0941      PT LONG TERM GOAL #1   Title  Pt will verbalize plans for ongoing community  fitness upon d/c from PT.  TARGET 11/18/17    Time  9    Period  Weeks    Status  Achieved      PT LONG TERM GOAL #2   Title  Pt will perform at least 8 of 10 reps of sit<>stand from less than 18 inch surfaces, with no posterior lean for improved transfer efficiency and safety.    Time  9    Period  Weeks    Status  Achieved      PT LONG TERM GOAL #3   Title  Pt will improve MiniBESTest score to at least 23/28 for decreased fall risk.    Time  9    Period  Weeks    Status  Achieved      PT LONG TERM GOAL #4   Title  Pt will demo <2 steps to recover balance in posterior push and release test for improved balance recovery in posterior direction.    Time  9    Period  Weeks    Status  Achieved      PT LONG TERM GOAL #5   Title  Pt will ambulate at least 1000 ft with appropriate assistive device, modified independently for improved community gait negotiation.    Baseline  per patient report, with rollator    Time  9    Period  Weeks    Status  Achieved            Plan - 11/03/17 1851    Clinical Impression Statement  Pt has met all LTGs.  She made significant progress with PT, and has lowered fall risk with MiniBESTest score of 25/28.  She plans to continue walking program and return to gym.  She requests return eval for PT in 6-7 months.     Rehab Potential  Good    Clinical Impairments Affecting Rehab Potential  Motivated for therapy; has current exercise routine    PT Frequency  2x / week   1x/wk for 1 week, then   PT Duration  8 weeks   9 weeks total POC   PT Treatment/Interventions  ADLs/Self Care Home Management;Gait training;Functional mobility training;Therapeutic activities;Therapeutic exercise;DME Instruction;Balance training;Neuromuscular re-education;Patient/family education    PT Next Visit Plan  Discharge PT this visit-plan for return eval in 6-7 months    PT Home Exercise Plan  [id number]    Consulted and Agree with Plan of Care  Patient       Patient will  benefit from skilled therapeutic intervention in order to improve the following deficits and impairments:  Abnormal gait, Decreased balance, Decreased mobility, Decreased strength, Difficulty walking, Postural dysfunction  Visit Diagnosis: Other abnormalities of gait and mobility  Unsteadiness on feet     Problem List Patient Active Problem List   Diagnosis Date Noted  . Pedal edema 10/03/2017  . Right arm pain 02/03/2017  . Recurrent falls 02/03/2017  . Advance care planning 01/23/2017  . Hematuria 07/20/2016  . Headache 05/11/2015  . Globus sensation 04/24/2015  . Foot pain 02/25/2015  . Shoulder pain 04/17/2014  . PD (  Parkinson's disease) ([place]) 06/05/2013  . Medicare annual wellness visit, subsequent 05/02/2012  . Neck pain 05/02/2012  . Osteoporosis 03/08/2011  . Breast cancer ([place]) 11/19/2010  . GERD (gastroesophageal reflux disease) 08/01/2008  . DYSPHAGIA UNSPECIFIED 08/01/2008  . ORTHOSTATIC HYPOTENSION 06/28/2008  . HLD (hyperlipidemia) 07/10/2007  . MONOCLONAL GAMMOPATHY 07/10/2007  . ANEMIA-NOS 07/10/2007  . FATIGUE 07/10/2007    Jeniffer Culliver W. 11/03/2017, 6:56 PM  Frazier Butt., PT   [facility] [address] [place], [name], [address] Phone: [phone]   Fax:  [phone]  Name: [name] MRN: [id number] Date of Birth: [birth year]   PHYSICAL THERAPY DISCHARGE SUMMARY  Visits from Start of Care: 10  Current functional level related to goals / functional outcomes: PT Long Term Goals - 11/03/17 0941      PT LONG TERM GOAL #1   Title  Pt will verbalize plans for ongoing community fitness upon d/c from PT.  TARGET 11/18/17    Time  9    Period  Weeks    Status  Achieved      PT LONG TERM GOAL #2   Title  Pt will perform at least 8 of 10 reps of sit<>stand from less than 18 inch surfaces, with no posterior lean for improved transfer efficiency and safety.    Time  9     Period  Weeks    Status  Achieved      PT LONG TERM GOAL #3   Title  Pt will improve MiniBESTest score to at least 23/28 for decreased fall risk.    Time  9    Period  Weeks    Status  Achieved      PT LONG TERM GOAL #4   Title  Pt will demo <2 steps to recover balance in posterior push and release test for improved balance recovery in posterior direction.    Time  9    Period  Weeks    Status  Achieved      PT LONG TERM GOAL #5   Title  Pt will ambulate at least 1000 ft with appropriate assistive device, modified independently for improved community gait negotiation.    Baseline  per patient report, with rollator    Time  9    Period  Weeks    Status  Achieved      Pt has met all LTGs.   Remaining deficits: Posture, balance, gait-improving   Education / Equipment: Educated in [employer], return to community fitness.  Plan: Patient agrees to discharge.  Patient goals were met. Patient is being discharged due to meeting the stated rehab goals.  ???PT recommends and pt agrees to return PT evaluation in 6-7 months, due to progressive nature of disease (or sooner if balance warrants).         Mady Haagensen, PT 11/03/17 7:00 PM Phone: [phone] Fax: [phone]

## 2017-11-09 ENCOUNTER — Ambulatory Visit: Payer: Medicare Other | Admitting: Physical Therapy

## 2017-11-10 ENCOUNTER — Ambulatory Visit (INDEPENDENT_AMBULATORY_CARE_PROVIDER_SITE_OTHER): Payer: Medicare Other | Admitting: Family Medicine

## 2017-11-10 ENCOUNTER — Encounter: Payer: Self-pay | Admitting: Family Medicine

## 2017-11-10 DIAGNOSIS — R609 Edema, unspecified: Secondary | ICD-10-CM

## 2017-11-10 NOTE — Progress Notes (Signed)
BLE edema. Started about 1.5 months ago.  She is trying to limit sodium intake but has been eating out more.  Unclear how much this contributes.  Swelling is not resolved.  Least amount of swelling in the early AM, upon waking.  More later in the day.  Not worse, may be some better overall, recently.  No CP, SOB.  Not SOB laying flat.  No leg pain except where her shoes get tight with the swelling.   Weight is stable.    She reports having first dose of Shingrix done outside of the clinic.  Vaccine history updated.  She is back on iron per hematology rec.    Meds, vitals, and allergies reviewed.   ROS: Per HPI unless specifically indicated in ROS section   GEN: nad, alert and oriented HEENT: mucous membranes moist NECK: supple w/o LA CV: rrr. PULM: ctab, no inc wob ABD: soft, +bs EXT: Trace BLE edema.  SKIN: no acute rash

## 2017-11-10 NOTE — Patient Instructions (Signed)
See if 10-41mm (of mercury) compression stockings help.  You can get them at a medical supply store- they should measure you and help fit them.   If not better, or if worse, then let me know.  Take care.  Glad to see you.

## 2017-11-11 ENCOUNTER — Ambulatory Visit: Payer: Medicare Other | Admitting: Neurology

## 2017-11-11 ENCOUNTER — Ambulatory Visit: Payer: Medicare Other

## 2017-11-11 ENCOUNTER — Ambulatory Visit: Payer: Medicare Other | Admitting: Physical Therapy

## 2017-11-11 ENCOUNTER — Telehealth: Payer: Self-pay | Admitting: Neurology

## 2017-11-11 DIAGNOSIS — R471 Dysarthria and anarthria: Secondary | ICD-10-CM | POA: Diagnosis not present

## 2017-11-11 DIAGNOSIS — R1313 Dysphagia, pharyngeal phase: Secondary | ICD-10-CM | POA: Diagnosis not present

## 2017-11-11 DIAGNOSIS — R2689 Other abnormalities of gait and mobility: Secondary | ICD-10-CM | POA: Diagnosis not present

## 2017-11-11 DIAGNOSIS — R29818 Other symptoms and signs involving the nervous system: Secondary | ICD-10-CM | POA: Diagnosis not present

## 2017-11-11 DIAGNOSIS — R2681 Unsteadiness on feet: Secondary | ICD-10-CM | POA: Diagnosis not present

## 2017-11-11 NOTE — Telephone Encounter (Signed)
Patient rescheduled her Botox appointment due being out of town. Dr. Carles Collet is booked out until March. Can she keep her 11/12 Follow Up and have Botox on 11/15? Or is there a spot she can be worked it within the 11 days? Please Advise. Thanks

## 2017-11-11 NOTE — Therapy (Signed)
[facility] [address] [place], [name], [address] Phone: [phone]   Fax:  [phone]  Speech Language Pathology Treatment  Patient Details  Name: [name] MRN: [id number] Date of Birth: 1940-06-26 Referring Provider (SLP): Tat, Wells Guiles, D.O.   Encounter Date: 11/11/2017  End of Session - 11/11/17 1347    Visit Number  7    Number of Visits  17    Date for SLP Re-Evaluation  12/23/17    SLP Start Time  1103    SLP Stop Time   1145    SLP Time Calculation (min)  42 min    Activity Tolerance  Patient tolerated treatment well       Past Medical History:  Diagnosis Date  . Allergy    SEASONAL  . Anemia    Anemia-NOS / PMH, Dr Julien Nordmann  . Arthritis   . Breast cancer ([place])    stage I right breast  . Cataract    BILATERAL  . Decreased vision    R eye  . Esophageal Stricture   . Hyperlipidemia   . Monoclonal gammopathy   . MVP (mitral valve prolapse)   . Osteoporosis   . Parkinson disease ([place])   . Skin cancer    basal cell L neck  . Vertical diplopia     Past Surgical History:  Procedure Laterality Date  . APPENDECTOMY  2002  . BONE BIOPSY  11/06/10  . BREAST LUMPECTOMY  02/2010   right breast lumpectomy and sentinel node biopsy  . CARDIAC CATHETERIZATION  2005   neg  . CATARACT EXTRACTION, BILATERAL  2018  . CATARACT EXTRACTION, BILATERAL Bilateral 07/2016  . COLONOSCOPY     negative   . HEMORRHOID SURGERY  1970's  . SALPINGOOPHORECTOMY  2002   for benign growths  . SHOULDER SURGERY Right   . TOTAL ABDOMINAL HYSTERECTOMY W/ BILATERAL SALPINGOOPHORECTOMY  77 yrs old   for pain,prolapse ; G3 P2  . UPPER GASTROINTESTINAL ENDOSCOPY      There were no vitals filed for this visit.         ADULT SLP TREATMENT - 11/11/17 1152      Treatment Provided   Treatment provided  Dysphagia;Cognitive-Linquistic      Dysphagia Treatment   Other treatment/comments  Modified  independent on her HEP. SLP encouraged pt to get HEP done on vacation when she and S.O. are walking. Pt independently followed her swallow precautions.      Cognitive-Linquistic Treatment   Treatment focused on  Dysarthria    Skilled Treatment  Pt held conversation in varying degrees of compexity with SLP for 25 minutes with WNL loudness persisting 100% of the time.       Assessment / Recommendations / Plan   Plan  --   likely d/c next session     Dysphagia Recommendations   Diet recommendations  Regular;Thin liquid    Liquids provided via  Cup    Medication Administration  --   as tolerated   Compensations  Slow rate;Small sips/bites;Follow solids with liquid      Progression Toward Goals   Progression toward goals  Progressing toward goals         SLP Short Term Goals - 11/11/17 1349      SLP SHORT TERM GOAL #1   Title  pt will produce loud /a/ at average mid 80s dB over 3 therapy sessions    Status  Partially Met      SLP SHORT  TERM GOAL #2   Title  pt will increase conversational loudness to average low 70s dB average in 5 minutes simple conversation over three sessions    Status  Achieved      SLP SHORT TERM GOAL #3   Title  pt will improve loudness in 19/20 sentences to average low 70s dB over three sessions    Status  Achieved      SLP SHORT TERM GOAL #4   Title  pt will perform dysphagia HEP with rare min A over three sessions    Baseline  11-01-17    Time  1    Period  Weeks    Status  Partially Met   1/3 sessions      SLP Long Term Goals - 11/11/17 1349      SLP LONG TERM GOAL #1   Title  pt will improve loudness in mod complex conversation of 7 minutes average low 70s dB, over 3 sessions    Period  --   or 17 visits, for all LTGs   Status  Achieved      SLP LONG TERM GOAL #2   Title  pt will improve loudenss in 7 minutes mod complex-complex conversation over 2 sessions, average 70dB, in mod noisy environment    Baseline  11-11-17    Time  4     Period  Weeks    Status  On-going      SLP LONG TERM GOAL #3   Title  pt will improve breath support to adequate, in 7 minutes mod complex/complex conversation    Status  Achieved      SLP LONG TERM GOAL #4   Title  pt will complete HEP for dysphagia over three sessions    Baseline  11-11-17    Time  4    Period  Weeks    Status  On-going      SLP LONG TERM GOAL #5   Title  pt will follow swallow precautions in 3 sessions with POs    Baseline  11-11-17    Time  4    Period  Weeks    Status  On-going       Plan - 11/11/17 1347    Clinical Impression Statement  Pt was modified independent with swallow HEP today, and presents with WNL volume in varying complexity of conversation in mod noisy environment. Precautions were followed with good success. Likely d/c next visit. Pt would cont to benefit from skilled ST focusing on improved swallow skills as well as incr'd loudness across speaking situations. Consider d/c if progress consistent in next 2-3 sessions.    Speech Therapy Frequency  1x /week    Duration  --   8 weeks/17 visits   Treatment/Interventions  Aspiration precaution training;Pharyngeal strengthening exercises;Diet toleration management by SLP;Internal/external aids;Multimodal communcation approach;Patient/family education;Compensatory strategies;Functional tasks;Cueing hierarchy;SLP instruction and feedback;Environmental controls    Potential to Achieve Goals  Good       Patient will benefit from skilled therapeutic intervention in order to improve the following deficits and impairments:   Dysarthria and anarthria  Dysphagia, pharyngeal phase    Problem List Patient Active Problem List   Diagnosis Date Noted  . Pedal edema 10/03/2017  . Right arm pain 02/03/2017  . Recurrent falls 02/03/2017  . Advance care planning 01/23/2017  . Hematuria 07/20/2016  . Headache 05/11/2015  . Globus sensation 04/24/2015  . Foot pain 02/25/2015  . Shoulder pain 04/17/2014   . PD (Parkinson's disease) ([facility])  06/05/2013  . Medicare annual wellness visit, subsequent 05/02/2012  . Neck pain 05/02/2012  . Osteoporosis 03/08/2011  . Breast cancer ([facility]) 11/19/2010  . GERD (gastroesophageal reflux disease) 08/01/2008  . DYSPHAGIA UNSPECIFIED 08/01/2008  . ORTHOSTATIC HYPOTENSION 06/28/2008  . HLD (hyperlipidemia) 07/10/2007  . MONOCLONAL GAMMOPATHY 07/10/2007  . ANEMIA-NOS 07/10/2007  . FATIGUE 07/10/2007    SCHINKE,CARL ,MS, [place]  11/11/2017, 1:50 PM  [facility] [address] [place], [name], [address] Phone: [phone]   Fax:  [phone]   Name: [name] MRN: [id number] Date of Birth: 09-May-1940

## 2017-11-11 NOTE — Telephone Encounter (Signed)
We will just keep November appt.   Dr. Carles Collet - okay to be seen that close together since for different diagnosis?

## 2017-11-13 DIAGNOSIS — R609 Edema, unspecified: Secondary | ICD-10-CM | POA: Insufficient documentation

## 2017-11-13 NOTE — Assessment & Plan Note (Addendum)
Minimal edema at this point, only trace on exam.  Discussed with patient about options.  She had echo done relatively recently.  This was unremarkable.  Lungs are clear.  Her chest sounds fine today.  She does not have shortness of breath when laying flat.  Discussed 10-89mm (of mercury) compression stockings.  See after visit summary.  Would not start diuretics.  Discussed trying to limit salt intake. If not better, or if worse, then she'll let me know.  Okay for outpatient follow-up.

## 2017-11-14 NOTE — Telephone Encounter (Signed)
It should be okay.

## 2017-11-15 NOTE — Telephone Encounter (Signed)
[name] will call.

## 2017-11-15 NOTE — Telephone Encounter (Signed)
Left message notifying patient to keep November appointment.

## 2017-11-16 ENCOUNTER — Ambulatory Visit: Payer: Medicare Other | Admitting: Physical Therapy

## 2017-11-16 ENCOUNTER — Encounter: Payer: Medicare Other | Admitting: Speech Pathology

## 2017-11-18 ENCOUNTER — Ambulatory Visit: Payer: Medicare Other | Admitting: Physical Therapy

## 2017-11-18 ENCOUNTER — Ambulatory Visit: Payer: Medicare Other | Admitting: Neurology

## 2017-11-21 ENCOUNTER — Ambulatory Visit: Payer: Medicare Other | Admitting: Physical Therapy

## 2017-11-24 ENCOUNTER — Ambulatory Visit: Payer: Medicare Other | Admitting: Physical Therapy

## 2017-11-24 ENCOUNTER — Ambulatory Visit: Payer: Medicare Other

## 2017-11-24 DIAGNOSIS — R1313 Dysphagia, pharyngeal phase: Secondary | ICD-10-CM | POA: Diagnosis not present

## 2017-11-24 DIAGNOSIS — R471 Dysarthria and anarthria: Secondary | ICD-10-CM

## 2017-11-24 DIAGNOSIS — R2681 Unsteadiness on feet: Secondary | ICD-10-CM | POA: Diagnosis not present

## 2017-11-24 DIAGNOSIS — R2689 Other abnormalities of gait and mobility: Secondary | ICD-10-CM | POA: Diagnosis not present

## 2017-11-24 DIAGNOSIS — R29818 Other symptoms and signs involving the nervous system: Secondary | ICD-10-CM | POA: Diagnosis not present

## 2017-11-24 NOTE — Therapy (Signed)
[facility] [address] [place], [name], [address] Phone: [phone]   Fax:  [phone]  Speech Language Pathology Treatment  Patient Details  Name: [name] MRN: [id number] Date of Birth: 1940-06-11 Referring Provider (SLP): Tat, Wells Guiles, D.O.   Encounter Date: 11/24/2017  End of Session - 11/24/17 1735    Visit Number  8    Number of Visits  17    Date for SLP Re-Evaluation  12/23/17    SLP Start Time  1404    SLP Stop Time   1445    SLP Time Calculation (min)  41 min    Activity Tolerance  Patient tolerated treatment well       Past Medical History:  Diagnosis Date  . Allergy    SEASONAL  . Anemia    Anemia-NOS / PMH, Dr Julien Nordmann  . Arthritis   . Breast cancer ([facility])    stage I right breast  . Cataract    BILATERAL  . Decreased vision    R eye  . Esophageal Stricture   . Hyperlipidemia   . Monoclonal gammopathy   . MVP (mitral valve prolapse)   . Osteoporosis   . Parkinson disease ([facility])   . Skin cancer    basal cell L neck  . Vertical diplopia     Past Surgical History:  Procedure Laterality Date  . APPENDECTOMY  2002  . BONE BIOPSY  11/06/10  . BREAST LUMPECTOMY  02/2010   right breast lumpectomy and sentinel node biopsy  . CARDIAC CATHETERIZATION  2005   neg  . CATARACT EXTRACTION, BILATERAL  2018  . CATARACT EXTRACTION, BILATERAL Bilateral 07/2016  . COLONOSCOPY     negative   . HEMORRHOID SURGERY  1970's  . SALPINGOOPHORECTOMY  2002   for benign growths  . SHOULDER SURGERY Right   . TOTAL ABDOMINAL HYSTERECTOMY W/ BILATERAL SALPINGOOPHORECTOMY  77 yrs old   for pain,prolapse ; G3 P2  . UPPER GASTROINTESTINAL ENDOSCOPY      There were no vitals filed for this visit.  Subjective Assessment - 11/24/17 1412    Subjective  "I guess he's having a good time hearing me. He hasn't mentioned it to me."    Currently in Pain?  Yes    Pain Score  10-Worst pain ever    Pain  Location  Back    Pain Orientation  Mid;Lower    Pain Descriptors / Indicators  Aching    Pain Type  Chronic pain    Pain Radiating Towards  abdomen    Pain Onset  More than a month ago    Pain Frequency  Constant    Aggravating Factors   bending over and picking up leg    Pain Relieving Factors  meds (icy hot pad, ibuprofen)            ADULT SLP TREATMENT - 11/24/17 1418      General Information   Behavior/Cognition  Alert;Pleasant mood;Cooperative      Treatment Provided   Treatment provided  Dysphagia;Cognitive-Linquistic      Dysphagia Treatment   Temperature Spikes Noted  No    Respiratory Status  Room air    Oral Cavity - Dentition  Adequate natural dentition    Treatment Methods  Therapeutic exercise    Patient observed directly with PO's  Yes    Type of PO's observed  Thin liquids    Oral Phase Signs & Symptoms  --   none  Pharyngeal Phase Signs & Symptoms  --   none   Other treatment/comments  Pt independently followed precautions for swallowing ,and was independent with HEP. She admitted to suboptimal completion so SLP reminded pt of BID HEP until 12-08-17, then twice a week.          Cognitive-Linquistic Treatment   Treatment focused on  Dysarthria    Skilled Treatment  SLP used loud /a/ to recalibrate pt's conversational speech with average upper 80s-low 90s dB. In therapy room pt maintained WNL loudness of low 70's dB for 14 minutes. Outside pt was approx 100% intelligible with traffic noise and wind noise.       Assessment / Recommendations / Plan   Plan  Continue with current plan of care   likely d/c next session     Dysphagia Recommendations   Diet recommendations  Regular;Thin liquid    Liquids provided via  Cup    Medication Administration  --   as tolerated   Compensations  Slow rate;Small sips/bites;Follow solids with liquid      Progression Toward Goals   Progression toward goals  Progressing toward goals         SLP Short Term  Goals - 11/11/17 1349      SLP SHORT TERM GOAL #1   Title  pt will produce loud /a/ at average mid 80s dB over 3 therapy sessions    Status  Partially Met      SLP SHORT TERM GOAL #2   Title  pt will increase conversational loudness to average low 70s dB average in 5 minutes simple conversation over three sessions    Status  Achieved      SLP SHORT TERM GOAL #3   Title  pt will improve loudness in 19/20 sentences to average low 70s dB over three sessions    Status  Achieved      SLP SHORT TERM GOAL #4   Title  pt will perform dysphagia HEP with rare min A over three sessions    Baseline  11-01-17    Time  1    Period  Weeks    Status  Partially Met   1/3 sessions      SLP Long Term Goals - 11/24/17 1737      SLP LONG TERM GOAL #1   Title  pt will improve loudness in mod complex conversation of 7 minutes average low 70s dB, over 3 sessions    Period  --   or 17 visits, for all LTGs   Status  Achieved      SLP LONG TERM GOAL #2   Title  pt will improve loudenss in 7 minutes mod complex-complex conversation over 2 sessions, average 70dB, in mod noisy environment    Status  Achieved      SLP LONG TERM GOAL #3   Title  pt will improve breath support to adequate, in 7 minutes mod complex/complex conversation    Status  Achieved      SLP LONG TERM GOAL #4   Title  pt will complete HEP for dysphagia over three sessions    Baseline  11-11-17, 11-24-17    Time  3    Period  Weeks    Status  On-going      SLP LONG TERM GOAL #5   Title  pt will follow swallow precautions in 3 sessions with POs    Baseline  11-11-17, 11-24-17    Time  3    Period  Weeks    Status  On-going       Plan - 11/24/17 1412    Clinical Impression Statement  Pt was modified independent with swallow HEP today as well as with precautions. She exhibited WNL volume in a varying complexity of conversation in min-mod noisy environments.  Pt would cont to benefit from skilled ST focusing on consistency  with precautions, HEP, and speech loudness over time and will thus be schedueld for what will likely be her final session in approx 2 weeks.     Speech Therapy Frequency  1x /week    Duration  --   8 weeks/17 visits   Treatment/Interventions  Aspiration precaution training;Pharyngeal strengthening exercises;Diet toleration management by SLP;Internal/external aids;Multimodal communcation approach;Patient/family education;Compensatory strategies;Functional tasks;Cueing hierarchy;SLP instruction and feedback;Environmental controls    Potential to Achieve Goals  Good       Patient will benefit from skilled therapeutic intervention in order to improve the following deficits and impairments:   Dysarthria and anarthria  Dysphagia, pharyngeal phase    Problem List Patient Active Problem List   Diagnosis Date Noted  . Edema 11/13/2017  . Right arm pain 02/03/2017  . Recurrent falls 02/03/2017  . Advance care planning 01/23/2017  . Hematuria 07/20/2016  . Headache 05/11/2015  . Globus sensation 04/24/2015  . Foot pain 02/25/2015  . Shoulder pain 04/17/2014  . PD (Parkinson's disease) ([facility]) 06/05/2013  . Medicare annual wellness visit, subsequent 05/02/2012  . Neck pain 05/02/2012  . Osteoporosis 03/08/2011  . Breast cancer ([facility]) 11/19/2010  . GERD (gastroesophageal reflux disease) 08/01/2008  . DYSPHAGIA UNSPECIFIED 08/01/2008  . ORTHOSTATIC HYPOTENSION 06/28/2008  . HLD (hyperlipidemia) 07/10/2007  . MONOCLONAL GAMMOPATHY 07/10/2007  . ANEMIA-NOS 07/10/2007  . FATIGUE 07/10/2007    Yaiza Palazzola ,[place], [place]  11/24/2017, 5:38 PM  [facility] [address] [place], [name], [address] Phone: [phone]   Fax:  [phone]   Name: [name] MRN: [id number] Date of Birth: 04/05/1940

## 2017-11-28 ENCOUNTER — Encounter: Payer: Medicare Other | Admitting: Speech Pathology

## 2017-11-28 ENCOUNTER — Ambulatory Visit: Payer: Medicare Other | Admitting: Physical Therapy

## 2017-12-01 ENCOUNTER — Ambulatory Visit: Payer: Medicare Other | Admitting: Physical Therapy

## 2017-12-05 ENCOUNTER — Ambulatory Visit: Payer: Medicare Other | Attending: Family Medicine

## 2017-12-05 DIAGNOSIS — R471 Dysarthria and anarthria: Secondary | ICD-10-CM | POA: Insufficient documentation

## 2017-12-05 DIAGNOSIS — R1313 Dysphagia, pharyngeal phase: Secondary | ICD-10-CM

## 2017-12-05 NOTE — Progress Notes (Signed)
[name] was seen today in the movement disorders clinic for neurologic consultation at the request of Megan Dunnings [name], MD.  The consultation is for the evaluation of tremor.  The records that were made available to me were reviewed.  No one accompanies the patient.   The first symptom(s) the patient noticed was unilateral hand tremor on the L and this was 4-5 months ago.  She is R hand dominant.  She states that it was intermittent when it started but now it is more constant.  No tremor elsewhere.    Only living relative with PD is a cousin, who is currently being treated at [facility].  08/17/13 update:  Pt states that tremor is improved on levodopa but not sure if it is otherwise helping (but may be).  Takes med at 8am/1pm/5-7pm.  No SE.  Blood pressure has been low but states that it is always low.  No lightheadedness.  Balance is not good, but no falls.  She completed therapy.  Is exercising at home.  She walks an hr a day 6 days per week and when she goes to the gym, she will use the treadmill.  Has signed up for PD exercise class.   11/20/13 update:  Pt f/u re: PD.  Is on carbidopa/levodopa 25/100 tid.  Is doing well.  No falls.  No lightheadedness.  No hallucinations.  Exercising faithfully.   Is having an aching pain in the shoulder and in the thumb region on the R side but PD primarily affects her on the L.  Some occasional tremor on the L.    04/10/14 update:  Pt is on carbidopa/levodopa 25/100 tid.  She is stable in regards to PD but unfortunately her husband just died 106 days ago.  She just started back exercising with her sister in law.  No SI/HI.  No falls.  No lightheadeness.  Some tremor but no more than usual.  08/12/14 update:  Pt is on carbidopa/levodopa 25/100 three times per day.  She has done PT/OT/ST since our last visit and just completed her last therapy session on 08/01/14.  She had an injection in her shoulder as well and then had therapy for that and it is feeling better.   No falls since our last visit.  No lightheadedness.  She is exercising faithfully.  She is walking for exercise 6 times a week.  She admits that she is crying a lot and states that it started before her husband died and sometimes it is crying over inconsequential stuff.  Sometimes, she cries over her husbands death but doesn't want medication for that.  She is not SI/HI.  12/16/14 update:  Pt is following up today and is still on carbidopa/levodopa 25/100 tid.   No significant changes since last visit.  She goes to the gym 6 days a week.  Sometimes, she will go two times a day.   She is going to be spending thanksgiving with her son.    Wearing off:  No.  How long before next dose:  n/a Falls:   No. N/V:  No. (had some nausea this past weekend but thinks related to food she ate) Hallucinations:  No.  visual distortions: No. Lightheaded:  Yes.  , just last few days  Syncope: No. Dyskinesia:  No.   04/15/15 update:  The patient presents today for follow-up.  She has a history of Parkinson's disease.  She is currently on carbidopa/levodopa 25/100, 3 times per day.  Overall, she  reports that she is doing well.  She denies any falls.  No hallucinations.  No lightheadedness or near syncope.  She is still exercising.  She started occupational therapy for her shoulder pain.  I saw that speech therapy recommended that she see GI because she had her esophagus structure in the past.  She has an appointment with [name] on March 30.  She states that she is having a little problem swallowing but "it isn't bad yet."  States that she is crying for unknown reason (watching the news, etc).  Doesn't think that it is depression.  09/09/15 update:  The patient follows up today.  I have reviewed multiple records since her last visit.  She remains on carbidopa/levodopa 25/100, one tablet 3 times per day.  Last visit, I was going to start her on Nuedexta, but it turns out that this interfered with her tamoxifen.  We ended  up starting her on Effexor XR, 75 mg daily.  The patient states that the crying spells resolved with the effexor.  She did have a modified barium swallow on 04/21/2015 that was normal.  She continued to have a globus sensation and saw [name] for this.  He felt that she had mild oropharyngeal dysphagia related to Parkinson's, but that was not demonstrated on her barium swallow.  He did do a CT of the abdomen that was negative.  The patient did go to the emergency room on 05/07/2015 with headache.  That has since resolved.  She has also been to Dr. [name] with lightheadedness, and also described vertigo to the physical therapist and they recommended vestibular rehabilitation.  She has been going to rehabilitation therapy.   That did help.  She has not had any falls.  No syncopal episodes.  No falls.  She does tell me she had surgery on 7/18 by [name] for rotator cuff.  She is still in a sling.    10/15/15 update:  The patient follows up today.  She is on carbidopa/levodopa 25/100, one tablet 3 times per day.  She is also on Effexor XR, 75 mg daily.  She feels that mood has been good.  She has significant orthostatic hypotension, but I was leery about giving her medication for this last visit because she was complaining about a lot of chest pain.  Not long after that, she saw [name], who ordered an echocardiogram which was performed on 09/23/2015 and was unremarkable.  Ejection fraction was 55-60%.  She had a nuclear stress test and there was no significant reversible ischemia. LVEF 67% with normal wall motion on the stress Myoview.  She did fall this past Sunday and hit her head.  She was putting on underwear and tripped and her face hit the floor.  Everytime she chews, her L face and jaw hurt.  She also has a bilateral frontal headache.  "It feels like a regular headache like I have had before."   Was told that she couldn't do therapy yesterday until she saw her doctor.  L shoulder is a bit sore.  Admits  that she hasn't been drinking quite as much water.  11/18/15 update:  The patient follows up today.  She remains on carbidopa/levodopa 25/100, one tablet 3 times per day.  She did have a CT of the brain and maxillofacial regions right after our last visit, due to a fall.  Fortunately, there was no intracranial abnormalities and no facial fractures.  She has recovered nicely.  She has had no  further falls.  Re: lightheaded, feeling a "little dizzy" with standing.  Thinks dizziness has not been "bad."  Wears abdominal binder some but makes her "hot."    Her mood is doing good on Effexor XR, 75 mg daily.  "I don't cry any more all of the time."  Going to PT and she is walking for exercise.  Is back to drinking about 5-6 bottles water per day.  She has started to note tremor in the RUE but not as much as in the LUE  02/18/16 update:  Patient follows up today, on carbidopa/levodopa 25/100, one tablet 3 times per day.  Overall, the patient states she has been stable.  She has had several falls since our last visit.  One time, she was vacuuming and moving backward and fell.  Two times, she fell on a curb.  She also fell backing up in the bathroom and she hit the toilet and the bathtub.  She got a "little hurt."  The last time she was dreaming and she fell out of the bed and hit her head on the floor.  That was about a week ago.  Denies LOC/alternation in consciousness.   Last PT for PD at end of July.  Had therapy for shoulder in November after shoulder surgery.   Occasional lightheadedness but no near syncope.  Mood has been good on Effexor XR, 75 mg daily.  05/21/16 update:  Patient seen today in follow-up.  She is on carbidopa/levodopa 25/100, one tablet 3 times per day.  Last visit, we started clonazepam 0.5 mg, half tablet at night for REM behavior disorder.  Reports that this has helped without side effects.  "I can't even tell I take it except I stay in the bed."  She has had no "real" falls since our last  visit.  She does tell me she slid off the bed once when she reached to get her remote control.  She then tells me about a fall backwards but she actually fell backward on the bed.     Her mood remains good on Effexor.  No hallucinations.  No lightheadedness or near syncope.  She is exercising daily at the gym.  She is in PT now.    09/29/16 update:  Patient seen in follow-up for her Parkinson's disease.  I increased her carbidopa/levodopa 25/100 last visit, so that she is now taking 1-1/2 tablets 3 times per day.  She states that she noted no big difference.  She is having no dyskinesia.  She is on clonazepam 0.5 mg, half tablet at night for REM behavior disorder.  This has worked well but she wonders if it causes hangover effects.   Her mood has been good with low dose Effexor, 75 mg daily.  She had cataracts extracted and she is happy with the results.  Dizziness has improved.  Not as much exercise as gym she was using closed.  02/15/17 update: Patient seen in follow-up for Parkinson's disease.  She is on carbidopa/levodopa 25/100, 1-1/2 tablets 3 times per day.  She had a fall in December and hurt her arm.  She was stepping backwards and tripped over something on the floor and fell on the fireplace on the arm.  She is still having pain in the arm that she fell arm. Around the same time, she fell off a friends porch.  She didn't get hurt that time.    No lightheadedness or near syncope.  Drinking water has improved her prior symptom of dizziness.  No hallucinations.  No visual distortions.  She remains on clonazepam 0.5 mg, half a tablet at night for REM behavior disorder.  Mood is good on Effexor, 75 mg daily.  On klonopin for RBD.  Awaiting prior auth and I wrote letter about this and submitted to her insurance company.    States that she has not been acting out the dreams.   I have reviewed records made available to me since last visit.  07/19/17 update: Patient is seen today in follow-up for Parkinson's  disease.  She remains on carbidopa/levodopa 25/100, 1.5 tablets 3 times per day.  She is still on clonazepam for REM behavior disorder (appealed with her insurance for it after last visit). Doing well with that.   She had therapy screens in February, 2019 and was determined that she did not need therapy at that time.  Records have been reviewed since last visit.  She also saw oncology for follow-up for breast cancer.  No treatment changes were made.  She had one fall since last visit.  She was getting wood and was stepping backward and tripped over bricks and fell in the wood pile and hit head.  Legs are feeling weaker/more tremulous.  Also noting abnormal movements of the face and tongue and sometimes has trouble speaking because of it.  Brings video of it.  "I slobber too."    12/06/17 update: Patient is seen today in follow-up for Parkinson's disease.  She is on carbidopa/levodopa 25/100, 1-1/2 tablets 3 times per day.  She was complaining about dyskinesia in the face last visit and we added amantadine, 100 mg twice per day.  She reports that it helped but the movements started some again about a month ago.  She had xeomin for sialorrhea at the end of June.  She states that it really helped.  She is on effexor XR, 75 mg daily for mood and mood has been good.  No SI/HI.  RBD doing well with low dose klonopin.  Had one near fall coming down stairs but didn't fall to ground because she ran into the fence.  She fell again coming down steps.  She has trouble with depth perception with steps.  Neuroimaging has not previously been performed.  It was supposed to be done 06/01/13 but pt cancelled as she was claustrophobic and couldn't do the scan even though it was in an open unit.   PREVIOUS MEDICATIONS: none to date  ALLERGIES:   Allergies  Allergen Reactions  . Penicillins Shortness Of Breath and Itching    CURRENT MEDICATIONS:  Current Outpatient Medications on File Prior to Visit  Medication Sig  Dispense Refill  . aspirin 81 MG tablet Take 81 mg by mouth daily.      [name] b complex vitamins tablet Take 1 tablet by mouth daily.    . Calcium 1500 MG tablet Take 1,500 mg by mouth.      . carbidopa-levodopa (SINEMET IR) 25-100 MG tablet TAKE 1 & 1/2 (ONE & ONE-HALF) TABLETS BY MOUTH THREE TIMES DAILY 405 tablet 1  . Cholecalciferol 5000 units TABS Take 1 tablet (5,000 Units total) by mouth daily.    . clonazePAM (KLONOPIN) 0.5 MG tablet TAKE 1/2 (ONE-HALF) TABLET BY MOUTH AT BEDTIME 15 tablet 5  . Denosumab (PROLIA Sparks) Inject into the skin every 6 (six) months.    . Elastic Bandages & Supports (ABDOMINAL BINDER/ELASTIC SMALL) MISC 1 Device by Does not apply route daily. 1 each 0  . Ferrous Sulfate (IRON) 325 (  65 Fe) MG TABS Take 325 mg by mouth.    . fluticasone (FLONASE) 50 MCG/ACT nasal spray Place 2 sprays into both nostrils daily. 16 g 1  . Multiple Vitamin (MULTIVITAMIN) capsule 2 tabs po qd     . Omega-3 Fatty Acids (OMEGA 3 PO) 1 tab po qd     . omeprazole (PRILOSEC) 20 MG capsule Take 1 capsule (20 mg total) by mouth daily. 90 capsule 3  . tamoxifen (NOLVADEX) 20 MG tablet TAKE 1 TABLET BY MOUTH ONCE DAILY 30 tablet 2  . traMADol (ULTRAM) 50 MG tablet TAKE 1 TO 2 TABLETS BY MOUTH AT BEDTIME AS NEEDED FOR PAIN 60 tablet 5  . venlafaxine XR (EFFEXOR-XR) 75 MG 24 hr capsule TAKE 1 CAPSULE BY MOUTH ONCE DAILY WITH BREAKFAST 90 capsule 1  . vitamin B-12 (CYANOCOBALAMIN) 500 MCG tablet Take 2500 mcg daily.    . nitroGLYCERIN (NITROSTAT) 0.3 MG SL tablet Place 1 tablet (0.3 mg total) under the tongue every 5 (five) minutes as needed (max 3 doses). Reported on 04/07/2015 (Patient not taking: Reported on 12/06/2017) 25 tablet 3   Current Facility-Administered Medications on File Prior to Visit  Medication Dose Route Frequency Provider Last Rate Last Dose  . incobotulinumtoxinA (XEOMIN) 100 units injection 100 Units  100 Units Intramuscular Q90 days Keyshun Elpers, Eustace Quail, DO   100 Units at 07/22/17 1122     PAST MEDICAL HISTORY:   Past Medical History:  Diagnosis Date  . Allergy    SEASONAL  . Anemia    Anemia-NOS / PMH, Dr Julien Nordmann  . Arthritis   . Breast cancer ([place])    stage I right breast  . Cataract    BILATERAL  . Decreased vision    R eye  . Esophageal Stricture   . Hyperlipidemia   . Monoclonal gammopathy   . MVP (mitral valve prolapse)   . Osteoporosis   . Parkinson disease ([place])   . Skin cancer    basal cell L neck  . Vertical diplopia     PAST SURGICAL HISTORY:   Past Surgical History:  Procedure Laterality Date  . APPENDECTOMY  2002  . BONE BIOPSY  11/06/10  . BREAST LUMPECTOMY  02/2010   right breast lumpectomy and sentinel node biopsy  . CARDIAC CATHETERIZATION  2005   neg  . CATARACT EXTRACTION, BILATERAL  2018  . CATARACT EXTRACTION, BILATERAL Bilateral 07/2016  . COLONOSCOPY     negative   . HEMORRHOID SURGERY  1970's  . SALPINGOOPHORECTOMY  2002   for benign growths  . SHOULDER SURGERY Right   . TOTAL ABDOMINAL HYSTERECTOMY W/ BILATERAL SALPINGOOPHORECTOMY  77 yrs old   for pain,prolapse ; G3 P2  . UPPER GASTROINTESTINAL ENDOSCOPY      SOCIAL HISTORY:   Social History   Socioeconomic History  . Marital status: Widowed    Spouse name: Not on file  . Number of children: 2  . Years of education: Not on file  . Highest education level: Not on file  Occupational History  . Occupation: Retired  Scientific laboratory technician  . Financial resource strain: Not on file  . Food insecurity:    Worry: Not on file    Inability: Not on file  . Transportation needs:    Medical: Not on file    Non-medical: Not on file  Tobacco Use  . Smoking status: Never Smoker  . Smokeless tobacco: Never Used  Substance and Sexual Activity  . Alcohol use: Yes    Alcohol/week:  1.0 standard drinks    Types: 1 Glasses of wine per week  . Drug use: No  . Sexual activity: Not on file  Lifestyle  . Physical activity:    Days per week: Not on file    Minutes per session: Not  on file  . Stress: Not on file  Relationships  . Social connections:    Talks on phone: Not on file    Gets together: Not on file    Attends religious service: Not on file    Active member of club or organization: Not on file    Attends meetings of clubs or organizations: Not on file    Relationship status: Not on file  . Intimate partner violence:    Fear of current or ex partner: Not on file    Emotionally abused: Not on file    Physically abused: Not on file    Forced sexual activity: Not on file  Other Topics Concern  . Not on file  Social History Narrative   Occupation: Textile   Widowed 2016, husband had cancer, was married in 1959    Patient has never smoked.    Illicit Drug Use - no   Patient does not get regular exercise.    Daily Caffeine Use: 1 cup of coffee daily    FAMILY HISTORY:   Family Status  Relation Name Status  . Mother  Deceased       heart disease  . Brother  Alive       diabetes  . Brother  Deceased       heart disease, renal failure  . Father  Deceased       hepatitis  . Sister  Alive       diabetes  . Brother  Deceased       drowned as a teenager  . Brother  Alive       diabetes  . Brother  Alive       diabetes  . Sister  Alive       diabetes, liver disease (alcohol)  . Sister  Alive       healthy  . Son  Alive       sarcoidosis  . Daughter  Alive       healthy  . Cousin  Alive       Parkinsons disease  . Neg Hx  (Not Specified)    ROS:  A complete 10 system review of systems was obtained and was unremarkable apart from what is mentioned above.  PHYSICAL EXAMINATION:    VITALS:   Vitals:   12/06/17 1422  BP: 118/66  Pulse: 86  SpO2: 97%  Weight: 125 lb (56.7 kg)  Height: 5\' 3"  (1.6 m)    GEN:  The patient appears stated age and is in NAD. HEENT:  Normocephalic, atraumatic.  The mucous membranes are moist. The superficial temporal arteries are without ropiness or tenderness. CV:  RRR Lungs:  CTAB Neck/HEME:  There  are no carotid bruits bilaterally.  Neurological examination:  Orientation: The patient is alert and oriented x3. Cranial nerves: There is good facial symmetry. The speech is fluent and clear. Soft palate rises symmetrically and there is no tongue deviation. Hearing is intact to conversational tone. Sensation: Sensation is intact to light touch throughout Motor: Strength is 5/5 in the bilateral upper and lower extremities.   Shoulder shrug is equal and symmetric.  There is no pronator drift.  Movement examination: Tone: There is normal tone in  the UE/LE Abnormal movements: no tremor.  No dyskinesia Coordination:  There is no decremation with RAM's, with any form of RAMS, including alternating supination and pronation of the forearm, hand opening and closing, finger taps, heel taps and toe taps. Gait and Station: The patient has no difficulty arising out of a deep-seated chair without the use of the hands. The patient's stride length is good.    ASSESSMENT/PLAN:  1.  Idiopathic Parkinson's disease.  She was dx in May, 2015.    -continue carbidopa/levodopa 25/100, 1.5 tablets tid.  -continue amantadine, 100 mg bid 2.  Depression  -doing well on Effexor XR, 75 mg daily.   4.  Dysphagia  -MBE on 04/21/15 was normal  -[name] did not think this was GI issue  -pt reports that this is resolved on 02/15/17 5.  Orthostatic hypotension  -Drinking more water now and feeling less dizzy  -Talked to her about Northera but orthostatics today are much better than in the past and worry about creating supine HTN.  Wear compression binder as much as possible, which she is doing.   6.  REM behavior disorder  -doing better with klonopin - 0.5 mg - 1/2 tablet at night.   7.  Sialorrhea  -xeomin done on 6.28.  Pt reports that was very helpful. 8.  Follow up is anticipated in the next 4-5 months, sooner should new neurologic issues arise.

## 2017-12-05 NOTE — Therapy (Signed)
[facility] [address] [place], [name], [address] Phone: [phone]   Fax:  [phone]  Speech Language Pathology Treatment/ discharge summary  Patient Details  Name: [name] MRN: [id number] Date of Birth: 07-Jul-1940 Referring Provider (SLP): Tat, Wells Guiles, D.O.   Encounter Date: 12/05/2017  End of Session - 12/05/17 1442    Visit Number  9    Number of Visits  17    Date for SLP Re-Evaluation  12/23/17    SLP Start Time  1320    SLP Stop Time   1400    SLP Time Calculation (min)  40 min    Activity Tolerance  Patient tolerated treatment well       Past Medical History:  Diagnosis Date  . Allergy    SEASONAL  . Anemia    Anemia-NOS / PMH, Dr Julien Nordmann  . Arthritis   . Breast cancer ([place])    stage I right breast  . Cataract    BILATERAL  . Decreased vision    R eye  . Esophageal Stricture   . Hyperlipidemia   . Monoclonal gammopathy   . MVP (mitral valve prolapse)   . Osteoporosis   . Parkinson disease ([place])   . Skin cancer    basal cell L neck  . Vertical diplopia     Past Surgical History:  Procedure Laterality Date  . APPENDECTOMY  2002  . BONE BIOPSY  11/06/10  . BREAST LUMPECTOMY  02/2010   right breast lumpectomy and sentinel node biopsy  . CARDIAC CATHETERIZATION  2005   neg  . CATARACT EXTRACTION, BILATERAL  2018  . CATARACT EXTRACTION, BILATERAL Bilateral 07/2016  . COLONOSCOPY     negative   . HEMORRHOID SURGERY  1970's  . SALPINGOOPHORECTOMY  2002   for benign growths  . SHOULDER SURGERY Right   . TOTAL ABDOMINAL HYSTERECTOMY W/ BILATERAL SALPINGOOPHORECTOMY  77 yrs old   for pain,prolapse ; G3 P2  . UPPER GASTROINTESTINAL ENDOSCOPY      There were no vitals filed for this visit.  Subjective Assessment - 12/05/17 1328    Subjective  "I make sure to do those things you want me to (for swallowing)."    Currently in Pain?  Yes    Pain Score  3     Pain Location   Back    Pain Orientation  Lower;Mid    Pain Descriptors / Indicators  Aching    Pain Type  Chronic pain    Pain Onset  More than a month ago    Pain Frequency  Constant    Aggravating Factors   bending over     Pain Relieving Factors  meds            ADULT SLP TREATMENT - 12/05/17 1321      General Information   Behavior/Cognition  Alert;Pleasant mood;Cooperative      Treatment Provided   Treatment provided  Dysphagia;Cognitive-Linquistic      Dysphagia Treatment   Temperature Spikes Noted  No    Respiratory Status  Room air    Oral Cavity - Dentition  Adequate natural dentition    Treatment Methods  Therapeutic exercise    Patient observed directly with PO's  Yes    Type of PO's observed  Thin liquids;Dysphagia 3 (soft)    Oral Phase Signs & Symptoms  --   none noted today   Pharyngeal Phase Signs & Symptoms  --   none today  Other treatment/comments  Pt with no overt s/s aspiration today - pt followed precautions with independence. HEP for dysphagia was completed with independence.       Cognitive-Linquistic Treatment   Treatment focused on  Dysarthria    Skilled Treatment  SLP used loud /a/ to recalibrate pt's conversational speech with average upper 80s -low 90s dB. In therapy room pt maintained WNL loudness of low 70's dB for 16 minutes. Outside pt was 100% intelligible with traffic and vehicle noise.       Assessment / Recommendations / Plan   Plan  Discharge SLP treatment due to (comment)   met goals     Dysphagia Recommendations   Diet recommendations  Regular;Thin liquid    Liquids provided via  Cup    Medication Administration  --   as tolerated   Compensations  Slow rate;Small sips/bites;Follow solids with liquid      Progression Toward Goals   Progression toward goals  Goals met, education completed, patient discharged from [facility] - 11/11/17 [place] #1   Title  pt will produce loud /a/ at  average mid 80s dB over 3 therapy sessions    Status  Partially Met      SLP SHORT TERM GOAL #2   Title  pt will increase conversational loudness to average low 70s dB average in 5 minutes simple conversation over three sessions    Status  Achieved      SLP SHORT TERM GOAL #3   Title  pt will improve loudness in 19/20 sentences to average low 70s dB over three sessions    Status  Achieved      SLP SHORT TERM GOAL #4   Title  pt will perform dysphagia HEP with rare min A over three sessions    Baseline  11-01-17    Time  1    Period  Weeks    Status  Partially Met   1/3 sessions      SLP Long Term Goals - 12/05/17 1327      SLP LONG TERM GOAL #1   Title  pt will improve loudness in mod complex conversation of 7 minutes average low 70s dB, over 3 sessions    Period  --   or 17 visits, for all LTGs   Status  Achieved      SLP LONG TERM GOAL #2   Title  pt will improve loudenss in 7 minutes mod complex-complex conversation over 2 sessions, average 70dB, in mod noisy environment    Status  Achieved      SLP LONG TERM GOAL #3   Title  pt will improve breath support to adequate, in 7 minutes mod complex/complex conversation    Status  Achieved      SLP LONG TERM GOAL #4   Title  pt will complete HEP for dysphagia over three sessions    Status  Achieved      SLP LONG TERM GOAL #5   Title  pt will follow swallow precautions in 3 sessions with POs    Status  Achieved       Plan - 12/05/17 1442    Clinical Impression Statement  Pt continues as modified independent with swallow HEP today and independent with precautions. She exhibited WNL volume in varying complexities of conversation in min-mod noisy environments.  Pt is discharged at this time from skilled ST  due to meeting goals.      Treatment/Interventions  Aspiration precaution training;Pharyngeal strengthening exercises;Diet toleration management by SLP;Internal/external aids;Multimodal communcation approach;Patient/family  education;Compensatory strategies;Functional tasks;Cueing hierarchy;SLP instruction and feedback;Environmental controls    Potential to Achieve Goals  Good       Patient will benefit from skilled therapeutic intervention in order to improve the following deficits and impairments:   Dysarthria and anarthria  Dysphagia, pharyngeal phase   SPEECH THERAPY DISCHARGE SUMMARY  Visits from Start of Care: 9  Current functional level related to goals / functional outcomes: Pt met or partially met all goals. See above. She reports satisfaction with her speech and swallow status currently.   Remaining deficits: None reported currently.   Education / Equipment: HEP for swallowing, loud /a/, need to continue loud /a/ after d/c.   Plan: Patient agrees to discharge.  Patient goals were partially met. Patient is being discharged due to being pleased with the current functional level.  ?????and meeting therapy goals.       Problem List Patient Active Problem List   Diagnosis Date Noted  . Edema 11/13/2017  . Right arm pain 02/03/2017  . Recurrent falls 02/03/2017  . Advance care planning 01/23/2017  . Hematuria 07/20/2016  . Headache 05/11/2015  . Globus sensation 04/24/2015  . Foot pain 02/25/2015  . Shoulder pain 04/17/2014  . PD (Parkinson's disease) ([place]) 06/05/2013  . Medicare annual wellness visit, subsequent 05/02/2012  . Neck pain 05/02/2012  . Osteoporosis 03/08/2011  . Breast cancer ([place]) 11/19/2010  . GERD (gastroesophageal reflux disease) 08/01/2008  . DYSPHAGIA UNSPECIFIED 08/01/2008  . ORTHOSTATIC HYPOTENSION 06/28/2008  . HLD (hyperlipidemia) 07/10/2007  . MONOCLONAL GAMMOPATHY 07/10/2007  . ANEMIA-NOS 07/10/2007  . FATIGUE 07/10/2007    Labria Wos ,[place], [place]  12/05/2017, 2:45 PM  [facility] [address] [place], [name], [address] Phone: [phone]   Fax:  [phone]   Name: [name] MRN: [id number] Date of Birth: 03-02-1940

## 2017-12-06 ENCOUNTER — Ambulatory Visit (INDEPENDENT_AMBULATORY_CARE_PROVIDER_SITE_OTHER): Payer: Medicare Other | Admitting: Neurology

## 2017-12-06 ENCOUNTER — Encounter: Payer: Self-pay | Admitting: Neurology

## 2017-12-06 VITALS — BP 118/66 | HR 86 | Ht 63.0 in | Wt 125.0 lb

## 2017-12-06 DIAGNOSIS — K117 Disturbances of salivary secretion: Secondary | ICD-10-CM

## 2017-12-06 DIAGNOSIS — G20 Parkinson's disease: Secondary | ICD-10-CM | POA: Diagnosis not present

## 2017-12-06 MED ORDER — AMANTADINE HCL 100 MG PO CAPS: 100.0000 mg | ORAL_CAPSULE | Freq: Two times a day (BID) | ORAL | 1 refills | Status: DC

## 2017-12-07 ENCOUNTER — Other Ambulatory Visit: Payer: Self-pay | Admitting: Internal Medicine

## 2017-12-07 DIAGNOSIS — C50919 Malignant neoplasm of unspecified site of unspecified female breast: Secondary | ICD-10-CM

## 2017-12-09 ENCOUNTER — Ambulatory Visit (INDEPENDENT_AMBULATORY_CARE_PROVIDER_SITE_OTHER): Payer: Medicare Other | Admitting: Neurology

## 2017-12-09 DIAGNOSIS — K117 Disturbances of salivary secretion: Secondary | ICD-10-CM | POA: Diagnosis not present

## 2017-12-09 MED ORDER — INCOBOTULINUMTOXINA 100 UNITS IM SOLR
100.0000 [IU] | Freq: Once | INTRAMUSCULAR | Status: AC
  Administered 2017-12-09: 100 [IU] via INTRAMUSCULAR

## 2017-12-09 NOTE — Procedures (Signed)
Botulinum Clinic   History:  Diagnosis: Sialorrhea associated with PD (icd10: K11.7)    Informed consent was obtained.  Discussed differences between myobloc and xeomin.  The patient was educated on the botulinum toxin the black blox warning and given a copy of the botox patient medication guide.  The patient understands that this warning states that there have been reported cases of the Botox extending beyond the injection site and creating adverse effects, similar to those of botulism. This included loss of strength, trouble walking, hoarseness, trouble saying words clearly, loss of bladder control, trouble breathing, trouble swallowing, diplopia, blurry vision and ptosis. Most of the distant spread of Botox was happening in patients, primarily children, who received medication for spasticity or for cervical dystonia. The patient expressed understanding and desire to proceed.   Injections  Location Left  Right Units Number of sites  Parotid (point 1 on picture) 30 30 60 1 per side  Submandibular (point 2) 20 20 40 1 per side  TOTAL UNITS:   100    Type of Toxin: Xeomin Discarded Units: 0  Needle drawback with each injection was free of blood. Pt tolerated procedure well without complications.   Reinjection is anticipated in 4 months.

## 2017-12-12 ENCOUNTER — Other Ambulatory Visit: Payer: Self-pay | Admitting: Neurology

## 2017-12-19 ENCOUNTER — Other Ambulatory Visit: Payer: Self-pay | Admitting: Neurology

## 2017-12-28 ENCOUNTER — Ambulatory Visit (INDEPENDENT_AMBULATORY_CARE_PROVIDER_SITE_OTHER): Payer: Medicare Other | Admitting: Family Medicine

## 2017-12-28 ENCOUNTER — Ambulatory Visit (INDEPENDENT_AMBULATORY_CARE_PROVIDER_SITE_OTHER)
Admission: RE | Admit: 2017-12-28 | Discharge: 2017-12-28 | Disposition: A | Discharge status: Home / Self Care | Payer: Medicare Other | Source: Ambulatory Visit | Attending: Family Medicine | Admitting: Family Medicine

## 2017-12-28 ENCOUNTER — Encounter: Payer: Self-pay | Admitting: Family Medicine

## 2017-12-28 VITALS — BP 122/72 | HR 86 | Temp 98.1°F | Ht 63.0 in | Wt 122.0 lb

## 2017-12-28 DIAGNOSIS — R300 Dysuria: Secondary | ICD-10-CM | POA: Diagnosis not present

## 2017-12-28 DIAGNOSIS — M545 Low back pain, unspecified: Secondary | ICD-10-CM

## 2017-12-28 LAB — POC URINALSYSI DIPSTICK (AUTOMATED)
Blood, UA: NEGATIVE
Glucose, UA: NEGATIVE
Nitrite, UA: NEGATIVE
Protein, UA: POSITIVE — AB
Spec Grav, UA: 1.02 (ref 1.010–1.025)
Urobilinogen, UA: 0.2 E.U./dL
pH, UA: 6 (ref 5.0–8.0)

## 2017-12-28 MED ORDER — TRAMADOL HCL 50 MG PO TABS: ORAL_TABLET | ORAL | Status: DC

## 2017-12-28 MED ORDER — SULFAMETHOXAZOLE-TRIMETHOPRIM 400-80 MG PO TABS: 1.0000 | ORAL_TABLET | Freq: Two times a day (BID) | ORAL | 0 refills | Status: DC

## 2017-12-28 NOTE — Progress Notes (Signed)
She had botox for sialorrhea and that helped, d/w pt. per neurology.  I will defer.  Started in the last few weeks with lower midline back pain, then would occ radiate to the R side of the lower back and the R lower abd.  Not on L side.  Picking up her R leg causes the pain.  She saw some bright red blood- not black- small amount- on toilet paper once.  Burning with urination noted in the last week or so.    No L sided sx. No fevers, no chills.  No vomiting.  No pain down the legs.  No recent falls, caution d/w pt.    She had been taking aleve frequently.  She is taking tramadol BID, no ADE on med.    She had the Shingrix vaccine done with the 2 doses listed under immunization history.  Meds, vitals, and allergies reviewed.   ROS: Per HPI unless specifically indicated in ROS section   GEN: nad, alert and oriented Tremor noted in the B legs and jaw, slightly more than prev noted.   HEENT: mucous membranes moist NECK: supple w/o LA CV: rrr PULM: ctab, no inc wob ABD: soft, +bs SKIN: no acute rash Lower back ttp.  Lower abd ttp without rebound. Trace BLE edema.  She has discomfort at the origin of the right quad with hip flexion

## 2017-12-28 NOTE — Patient Instructions (Signed)
Drink plenty of water and start septra today.  We'll contact you with your lab and xray report.    Try taking tramadol up to 3 times a day for the pain.  Sedation caution.   Take care.  Update me as needed in a few days, especially if worse in the meantime.

## 2017-12-29 DIAGNOSIS — R300 Dysuria: Secondary | ICD-10-CM | POA: Insufficient documentation

## 2017-12-29 DIAGNOSIS — M545 Low back pain, unspecified: Secondary | ICD-10-CM | POA: Insufficient documentation

## 2017-12-29 LAB — URINE CULTURE
MICRO NUMBER:: 91452306
SPECIMEN QUALITY:: ADEQUATE

## 2017-12-29 NOTE — Assessment & Plan Note (Signed)
X-ray reviewed.  See notes on imaging.  Reasonable to try tramadol more frequently.  Discussed with patient about limiting NSAIDs.  Sedation caution on tramadol.  Update me as needed.  She agrees.  It also could be that she has a right quad strain.  Discussed.  I would still use tramadol and relative rest.  She agrees.

## 2017-12-29 NOTE — Assessment & Plan Note (Signed)
She could have cystitis.  Discussed with patient.  Reasonable to start Septra.  Urinalysis discussed.  Check urine culture.  She agrees.  Still okay for outpatient follow-up.

## 2018-01-03 ENCOUNTER — Telehealth: Payer: Self-pay | Admitting: *Deleted

## 2018-01-03 NOTE — Telephone Encounter (Signed)
-----   Message from Tonia Ghent, MD sent at 01/02/2018  7:16 AM EST ----- See urine culture result.  Call patient.  See if taking tramadol is helping with her back pain.  She does have degenerative changes in the lower back along with lumbar curvature that is not a new finding.  Let me know how she is doing.  Thanks.

## 2018-01-04 NOTE — Telephone Encounter (Signed)
Patient notified as instructed by telephone and verbalized understanding. Patient stated that the Tramadol has helped a lot and she is doing better.

## 2018-01-04 NOTE — Telephone Encounter (Signed)
Noted. Thanks.  Would continue as is.  Update me as needed.

## 2018-01-04 NOTE — Telephone Encounter (Signed)
Pt returned call to [place] for results. °

## 2018-01-12 ENCOUNTER — Ambulatory Visit (INDEPENDENT_AMBULATORY_CARE_PROVIDER_SITE_OTHER): Payer: Medicare Other | Admitting: Family Medicine

## 2018-01-12 VITALS — BP 112/64 | HR 87 | Temp 98.7°F | Ht 63.0 in | Wt 122.5 lb

## 2018-01-12 DIAGNOSIS — M545 Low back pain, unspecified: Secondary | ICD-10-CM

## 2018-01-12 DIAGNOSIS — R300 Dysuria: Secondary | ICD-10-CM

## 2018-01-12 LAB — POC URINALSYSI DIPSTICK (AUTOMATED)
Bilirubin, UA: NEGATIVE
Blood, UA: NEGATIVE
Glucose, UA: NEGATIVE
Ketones, UA: NEGATIVE
Leukocytes, UA: NEGATIVE
Nitrite, UA: NEGATIVE
Protein, UA: NEGATIVE
Spec Grav, UA: 1.02 (ref 1.010–1.025)
Urobilinogen, UA: 0.2 E.U./dL
pH, UA: 6 (ref 5.0–8.0)

## 2018-01-12 MED ORDER — SULFAMETHOXAZOLE-TRIMETHOPRIM 400-80 MG PO TABS: 1.0000 | ORAL_TABLET | Freq: Two times a day (BID) | ORAL | 0 refills | Status: DC

## 2018-01-12 MED ORDER — TRAMADOL HCL 50 MG PO TABS: ORAL_TABLET | ORAL | 3 refills | Status: DC

## 2018-01-12 NOTE — Patient Instructions (Signed)
Use the tramadol as needed.  Drink plenty of water and start the antibiotics today.  We'll contact you with your lab report.  Take care.

## 2018-01-12 NOTE — Progress Notes (Signed)
Dysuria: yes, intermittently.   duration of symptoms: days to week.  Some better after last OV then worse again- she clearly improved when on septra.  abdominal pain: yes fevers:no back pain: see below, likely from separate issue.   Vomiting:no U/a d/w pt.  ucx pending.   Tramadol helped some with back pain.  D/w pt. no adverse effect on medication.  She needed a refill, done at office visit.  Meds, vitals, and allergies reviewed.   Per HPI unless specifically indicated in ROS section   GEN: nad, alert and oriented HEENT: mucous membranes moist NECK: supple CV: rrr.  PULM: ctab, no inc wob ABD: soft, +bs, suprapubic area tender EXT: no edema SKIN: no acute rash BACK: no CVA pain

## 2018-01-13 LAB — URINE CULTURE
MICRO NUMBER:: 91521723
SPECIMEN QUALITY:: ADEQUATE

## 2018-01-15 ENCOUNTER — Other Ambulatory Visit: Payer: Self-pay | Admitting: Family Medicine

## 2018-01-15 DIAGNOSIS — G20 Parkinson's disease: Secondary | ICD-10-CM

## 2018-01-15 DIAGNOSIS — M810 Age-related osteoporosis without current pathological fracture: Secondary | ICD-10-CM

## 2018-01-15 DIAGNOSIS — E782 Mixed hyperlipidemia: Secondary | ICD-10-CM

## 2018-01-15 DIAGNOSIS — R7989 Other specified abnormal findings of blood chemistry: Secondary | ICD-10-CM

## 2018-01-15 DIAGNOSIS — E049 Nontoxic goiter, unspecified: Secondary | ICD-10-CM

## 2018-01-15 NOTE — Assessment & Plan Note (Signed)
Relief from tramadol.  No adverse effect.  Continue as is.  Refill done.  Update me as needed.

## 2018-01-15 NOTE — Assessment & Plan Note (Signed)
Presumed urinary tract infection.  She did better after the last office visit but then got worse again when she was off Septra.  Check urine culture.  Restart Septra.  See notes on labs.  She agrees.  Nontoxic.  Okay for outpatient follow-up.

## 2018-01-16 ENCOUNTER — Ambulatory Visit: Payer: Medicare Other

## 2018-01-16 ENCOUNTER — Ambulatory Visit (INDEPENDENT_AMBULATORY_CARE_PROVIDER_SITE_OTHER): Payer: Medicare Other

## 2018-01-16 VITALS — BP 118/72 | HR 93 | Temp 98.5°F | Ht 64.0 in | Wt 119.0 lb

## 2018-01-16 DIAGNOSIS — M810 Age-related osteoporosis without current pathological fracture: Secondary | ICD-10-CM

## 2018-01-16 DIAGNOSIS — E049 Nontoxic goiter, unspecified: Secondary | ICD-10-CM | POA: Diagnosis not present

## 2018-01-16 DIAGNOSIS — G20 Parkinson's disease: Secondary | ICD-10-CM

## 2018-01-16 DIAGNOSIS — Z0000 Encounter for general adult medical examination without abnormal findings: Secondary | ICD-10-CM | POA: Diagnosis not present

## 2018-01-16 DIAGNOSIS — E782 Mixed hyperlipidemia: Secondary | ICD-10-CM | POA: Diagnosis not present

## 2018-01-16 DIAGNOSIS — R7989 Other specified abnormal findings of blood chemistry: Secondary | ICD-10-CM | POA: Diagnosis not present

## 2018-01-16 LAB — LIPID PANEL
Cholesterol: 156 mg/dL (ref 0–200)
HDL: 67.6 mg/dL (ref >39.00)
LDL Cholesterol: 76 mg/dL (ref 0–99)
NonHDL: 87.97
Total CHOL/HDL Ratio: 2
Triglycerides: 62 mg/dL (ref 0.0–149.0)
VLDL: 12.4 mg/dL (ref 0.0–40.0)

## 2018-01-16 LAB — VITAMIN D 25 HYDROXY (VIT D DEFICIENCY, FRACTURES): VITD: 32.94 ng/mL (ref 30.00–100.00)

## 2018-01-16 LAB — TSH: TSH: 4.5 u[IU]/mL (ref 0.35–4.50)

## 2018-01-16 NOTE — Patient Instructions (Addendum)
[name] , Thank you for taking time to come for your Medicare Wellness Visit. I appreciate your ongoing commitment to your health goals. Please review the following plan we discussed and let me know if I can assist you in the future.   These are the goals we discussed: Goals    . Increase physical activity     Starting 01/16/2018 I will continue to exercise for 120 minutes 5 days per week.        This is a list of the screening recommended for you and due dates:  Health Maintenance  Topic Date Due  . Tetanus Vaccine  03/04/2019  . Flu Shot  Completed  . DEXA scan (bone density measurement)  Completed  . Pneumonia vaccines  Completed      Preventive Care for Adults  A healthy lifestyle and preventive care can promote health and wellness. Preventive health guidelines for adults include the following key practices.  . A routine yearly physical is a good way to check with your health care provider about your health and preventive screening. It is a chance to share any concerns and updates on your health and to receive a thorough exam.  . Visit your dentist for a routine exam and preventive care every 6 months. Brush your teeth twice a day and floss once a day. Good oral hygiene prevents tooth decay and gum disease.  . The frequency of eye exams is based on your age, health, family medical history, use  of contact lenses, and other factors. Follow your health care provider's recommendations for frequency of eye exams.  . Eat a healthy diet. Foods like vegetables, fruits, whole grains, low-fat dairy products, and lean protein foods contain the nutrients you need without too many calories. Decrease your intake of foods high in solid fats, added sugars, and salt. Eat the right amount of calories for you. Get information about a proper diet from your health care provider, if necessary.  . Regular physical exercise is one of the most important things you can do for your health. Most  adults should get at least 150 minutes of moderate-intensity exercise (any activity that increases your heart rate and causes you to sweat) each week. In addition, most adults need muscle-strengthening exercises on 2 or more days a week.  Silver Sneakers may be a benefit available to you. To determine eligibility, you may visit the website: www.silversneakers.com or contact program at 725-311-7722 Mon-Fri between 8AM-8PM.   . Maintain a healthy weight. The body mass index (BMI) is a screening tool to identify possible weight problems. It provides an estimate of body fat based on height and weight. Your health care provider can find your BMI and can help you achieve or maintain a healthy weight.   For adults 20 years and older: ? A BMI below 18.5 is considered underweight. ? A BMI of 18.5 to 24.9 is normal. ? A BMI of 25 to 29.9 is considered overweight. ? A BMI of 30 and above is considered obese.   . Maintain normal blood lipids and cholesterol levels by exercising and minimizing your intake of saturated fat. Eat a balanced diet with plenty of fruit and vegetables. Blood tests for lipids and cholesterol should begin at age 66 and be repeated every 5 years. If your lipid or cholesterol levels are high, you are over 50, or you are at high risk for heart disease, you may need your cholesterol levels checked more frequently. Ongoing high lipid and cholesterol levels should  be treated with medicines if diet and exercise are not working.  . If you smoke, find out from your health care provider how to quit. If you do not use tobacco, please do not start.  . If you choose to drink alcohol, please do not consume more than 2 drinks per day. One drink is considered to be 12 ounces (355 mL) of beer, 5 ounces (148 mL) of wine, or 1.5 ounces (44 mL) of liquor.  . If you are 19-20 years old, ask your health care provider if you should take aspirin to prevent strokes.  . Use sunscreen. Apply sunscreen  liberally and repeatedly throughout the day. You should seek shade when your shadow is shorter than you. Protect yourself by wearing long sleeves, pants, a wide-brimmed hat, and sunglasses year round, whenever you are outdoors.  . Once a month, do a whole body skin exam, using a mirror to look at the skin on your back. Tell your health care provider of new moles, moles that have irregular borders, moles that are larger than a pencil eraser, or moles that have changed in shape or color.

## 2018-01-16 NOTE — Progress Notes (Signed)
PCP notes:   Health maintenance:  No gaps identified.  Abnormal screenings:   Hearing - failed  Hearing Screening   125Hz  250Hz  500Hz  1000Hz  2000Hz  3000Hz  4000Hz  6000Hz  8000Hz   Right ear:   40 40 40  0    Left ear:   40 40 40  0     Falls - hx of multiple falls Fall Risk  01/16/2018 12/06/2017 07/19/2017 02/15/2017 01/13/2017  Falls in the past year? 1 1 Yes Yes Yes  Comment several falls due to loss of balance - - - -  Number falls in past yr: 1 1 2  or more 2 or more 2 or more  Injury with Fall? 1 0 Yes Yes Yes  Comment bruising - - - -  Risk Factor Category  - - - High Fall Risk High Fall Risk  Risk for fall due to : - History of fall(s);Impaired balance/gait - - History of fall(s);Impaired balance/gait  Follow up - Falls evaluation completed - Falls evaluation completed -   Patient concerns:   None  Nurse concerns:  None  Next PCP appt:   01/30/2018 @ 1200  I reviewed health advisor's note, was available for consultation on the day of service listed in this note, and agree with documentation and plan. [name], MD.

## 2018-01-16 NOTE — Progress Notes (Signed)
Subjective:   [name] is a 77 y.o. female who presents for Medicare Annual (Subsequent) preventive examination.  Review of Systems:  N/A Cardiac Risk Factors include: advanced age (>4men, >73 women);dyslipidemia     Objective:     Vitals: BP 118/72 (BP Location: Left Arm, Patient Position: Sitting, Cuff Size: Normal)   Pulse 93   Temp 98.5 F (36.9 C) (Oral)   Ht 5\' 4"  (1.626 m) Comment: shoes  Wt 119 lb (54 kg)   SpO2 95%   BMI 20.43 kg/m   Body mass index is 20.43 kg/m.  Advanced Directives 01/16/2018 09/23/2017 01/13/2017 10/18/2016 04/19/2016 04/12/2016 04/12/2016  Does Patient Have a Medical Advance Directive? Yes Yes Yes Yes Yes Yes No  Type of Paramedic of [place];Living will [place];Living will [place];Living will [place];Living will [place];Living will [place];Living will -  Does patient want to make changes to medical advance directive? - - Yes (MAU/Ambulatory/Procedural Areas - Information given) - - - -  Copy of [place] in Chart? No - copy requested - No - copy requested Yes No - copy requested No - copy requested -  Would patient like information on creating a medical advance directive? - - - - - - -    Tobacco Social History   Tobacco Use  Smoking Status Never Smoker  Smokeless Tobacco Never Used     Counseling given: No   Clinical Intake:  Pre-visit preparation completed: Yes  Pain : 0-10 Pain Score: 5  Pain Type: Chronic pain Pain Location: Hip Pain Orientation: Right Pain Onset: More than a month ago Pain Frequency: Intermittent     Nutritional Status: BMI of 19-24  Normal Nutritional Risks: None Diabetes: No  How often do you need to have someone help you when you read instructions, pamphlets, or other written materials from your doctor or pharmacy?: 1 - Never What is the last  grade level you completed in school?: 12th grade + 2 yrs college  Interpreter Needed?: No  Comments: pt is a widow and lives alone Information entered by :: LPinson, LPN  Past Medical History:  Diagnosis Date  . Allergy    SEASONAL  . Anemia    Anemia-NOS / PMH, Dr Julien Nordmann  . Arthritis   . Breast cancer ([place])    stage I right breast  . Cataract    BILATERAL  . Decreased vision    R eye  . Esophageal Stricture   . Hyperlipidemia   . Monoclonal gammopathy   . MVP (mitral valve prolapse)   . Osteoporosis   . Parkinson disease ([place])   . Skin cancer    basal cell L neck  . Vertical diplopia    Past Surgical History:  Procedure Laterality Date  . APPENDECTOMY  2002  . BONE BIOPSY  11/06/10  . BREAST LUMPECTOMY  02/2010   right breast lumpectomy and sentinel node biopsy  . CARDIAC CATHETERIZATION  2005   neg  . CATARACT EXTRACTION, BILATERAL  2018  . CATARACT EXTRACTION, BILATERAL Bilateral 07/2016  . COLONOSCOPY     negative   . HEMORRHOID SURGERY  1970's  . SALPINGOOPHORECTOMY  2002   for benign growths  . SHOULDER SURGERY Right   . TOTAL ABDOMINAL HYSTERECTOMY W/ BILATERAL SALPINGOOPHORECTOMY  77 yrs old   for pain,prolapse ; G3 P2  . UPPER GASTROINTESTINAL ENDOSCOPY     Family History  Problem  Relation Age of Onset  . Diabetes Mother   . Heart disease Mother   . Diabetes Brother   . Heart disease Brother   . Hepatitis Father   . Diabetes Brother   . Diabetes Brother   . Breast cancer Neg Hx   . Colon cancer Neg Hx    Social History   Socioeconomic History  . Marital status: Widowed    Spouse name: Not on file  . Number of children: 2  . Years of education: Not on file  . Highest education level: Not on file  Occupational History  . Occupation: Retired  Scientific laboratory technician  . Financial resource strain: Not on file  . Food insecurity:    Worry: Not on file    Inability: Not on file  . Transportation needs:    Medical: Not on file    Non-medical: Not  on file  Tobacco Use  . Smoking status: Never Smoker  . Smokeless tobacco: Never Used  Substance and Sexual Activity  . Alcohol use: Yes    Alcohol/week: 1.0 standard drinks    Types: 1 Glasses of wine per week  . Drug use: No  . Sexual activity: Not Currently  Lifestyle  . Physical activity:    Days per week: Not on file    Minutes per session: Not on file  . Stress: Not on file  Relationships  . Social connections:    Talks on phone: Not on file    Gets together: Not on file    Attends religious service: Not on file    Active member of club or organization: Not on file    Attends meetings of clubs or organizations: Not on file    Relationship status: Not on file  Other Topics Concern  . Not on file  Social History Narrative   Occupation: Textile   Widowed 2016, husband had cancer, was married in 1959    Patient has never smoked.    Illicit Drug Use - no   Patient does not get regular exercise.    Daily Caffeine Use: 1 cup of coffee daily    Outpatient Encounter Medications as of 01/16/2018  Medication Sig  . amantadine (SYMMETREL) 100 MG capsule Take 1 capsule (100 mg total) by mouth 2 (two) times daily.  [name] aspirin 81 MG tablet Take 81 mg by mouth daily.    [name] b complex vitamins tablet Take 1 tablet by mouth daily.  . Calcium 1500 MG tablet Take 1,500 mg by mouth.    . carbidopa-levodopa (SINEMET IR) 25-100 MG tablet TAKE 1 & 1/2 (ONE & ONE-HALF) TABLETS BY MOUTH THREE TIMES DAILY  . Cholecalciferol 5000 units TABS Take 1 tablet (5,000 Units total) by mouth daily.  . clonazePAM (KLONOPIN) 0.5 MG tablet TAKE 1/2 (ONE-HALF) TABLET BY MOUTH AT BEDTIME  . Denosumab (PROLIA [place]) Inject into the skin every 6 (six) months.  . Elastic Bandages & Supports (ABDOMINAL BINDER/ELASTIC SMALL) MISC 1 Device by Does not apply route daily.  . Ferrous Sulfate (IRON) 325 (65 Fe) MG TABS Take 325 mg by mouth.  . fluticasone (FLONASE) 50 MCG/ACT nasal spray Place 2 sprays into both nostrils  daily.  . Multiple Vitamin (MULTIVITAMIN) capsule 2 tabs po qd   . nitroGLYCERIN (NITROSTAT) 0.3 MG SL tablet Place 1 tablet (0.3 mg total) under the tongue every 5 (five) minutes as needed (max 3 doses). Reported on 04/07/2015  . Omega-3 Fatty Acids (OMEGA 3 PO) 1 tab po qd   .  omeprazole (PRILOSEC) 20 MG capsule Take 1 capsule (20 mg total) by mouth daily.  . OnabotulinumtoxinA (BOTOX IM) Inject into the muscle every 3 (three) months. Injection every 3 months  . sulfamethoxazole-trimethoprim (BACTRIM) 400-80 MG tablet Take 1 tablet by mouth 2 (two) times daily.  . tamoxifen (NOLVADEX) 20 MG tablet TAKE 1 TABLET BY MOUTH ONCE DAILY  . traMADol (ULTRAM) 50 MG tablet TAKE 1 TO 2 TABLETS BY MOUTH 3 TIMES A DAY AS NEEDED FOR PAIN  . venlafaxine XR (EFFEXOR-XR) 75 MG 24 hr capsule TAKE 1 CAPSULE BY MOUTH ONCE DAILY WITH BREAKFAST  . vitamin B-12 (CYANOCOBALAMIN) 500 MCG tablet Take 2500 mcg daily.   Facility-Administered Encounter Medications as of 01/16/2018  Medication  . incobotulinumtoxinA (XEOMIN) 100 units injection 100 Units    Activities of Daily Living In your present state of health, do you have any difficulty performing the following activities: 01/16/2018  Hearing? N  Vision? N  Difficulty concentrating or making decisions? Y  Walking or climbing stairs? Y  Dressing or bathing? N  Doing errands, shopping? N  Preparing Food and eating ? N  Using the Toilet? N  In the past six months, have you accidently leaked urine? Y  Do you have problems with loss of bowel control? Y  Managing your Medications? N  Managing your Finances? N  Housekeeping or managing your Housekeeping? N  Some recent data might be hidden    Patient Care Team: Tonia Ghent, MD as PCP - General (Family Medicine)    Assessment:   This is a routine wellness examination for [name].   Hearing Screening   125Hz  250Hz  500Hz  1000Hz  2000Hz  3000Hz  4000Hz  6000Hz  8000Hz   Right ear:   40 40 40  0    Left  ear:   40 40 40  0    Vision Screening Comments: Vision exam in 2019 with Dr. Delsa Bern   Exercise Activities and Dietary recommendations Current Exercise Habits: Home exercise routine, Type of exercise: treadmill;walking(60 min treadmill; 60 min walking), Time (Minutes): > 60, Frequency (Times/Week): 5, Weekly Exercise (Minutes/Week): 0, Intensity: Moderate, Exercise limited by: None identified  Goals    . Increase physical activity     Starting 01/16/2018 I will continue to exercise for 120 minutes 5 days per week.        Fall Risk Fall Risk  01/16/2018 12/06/2017 07/19/2017 02/15/2017 01/13/2017  Falls in the past year? 1 1 Yes Yes Yes  Comment several falls due to loss of balance - - - -  Number falls in past yr: 1 1 2  or more 2 or more 2 or more  Injury with Fall? 1 0 Yes Yes Yes  Comment bruising - - - -  Risk Factor Category  - - - High Fall Risk High Fall Risk  Risk for fall due to : - History of fall(s);Impaired balance/gait - - History of fall(s);Impaired balance/gait  Follow up - Falls evaluation completed - Falls evaluation completed -   Depression Screen PHQ 2/9 Scores 01/16/2018 01/13/2017 10/17/2015 01/30/2015  PHQ - 2 Score 0 0 0 0  PHQ- 9 Score 0 0 - -     Cognitive Function MMSE - Mini Mental State Exam 01/16/2018 01/13/2017  Orientation to time 5 5  Orientation to Place 5 5  Registration 3 3  Attention/ Calculation 0 0  Recall 3 3  Language- name 2 objects 0 0  Language- repeat 1 1  Language- follow 3 step command 3 3  Language-  read & follow direction 0 0  Write a sentence 0 0  Copy design 0 0  Total score 20 20     PLEASE NOTE: A Mini-Cog screen was completed. Maximum score is 20. A value of 0 denotes this part of [place] MMSE was not completed or the patient failed this part of the Mini-Cog screening.   Mini-Cog Screening Orientation to Time - Max 5 pts Orientation to Place - Max 5 pts Registration - Max 3 pts Recall - Max 3  pts Language Repeat - Max 1 pts Language Follow 3 Step Command - Max 3 pts     Immunization History  Administered Date(s) Administered  . Influenza Split 01/06/2011, 12/01/2011  . Influenza Whole 02/21/2009  . Influenza,inj,Quad PF,6+ Mos 10/31/2012, 10/08/2013, 10/08/2014, 09/26/2015, 10/06/2016, 10/03/2017  . Influenza,inj,quad, With Preservative 09/27/2016  . Pneumococcal Conjugate-13 07/18/2014  . Pneumococcal Polysaccharide-23 03/04/2011  . Zoster 10/26/2012  . Zoster Recombinat (Shingrix) 10/08/2017, 12/07/2017    Screening Tests Health Maintenance  Topic Date Due  . TETANUS/TDAP  03/04/2019  . INFLUENZA VACCINE  Completed  . DEXA SCAN  Completed  . PNA vac Low Risk Adult  Completed      Plan:     I have personally reviewed, addressed, and noted the following in the patient's chart:  A. Medical and social history B. Use of alcohol, tobacco or illicit drugs  C. Current medications and supplements D. Functional ability and status E.  Nutritional status F.  Physical activity G. Advance directives H. List of other physicians I.  Hospitalizations, surgeries, and ER visits in previous 12 months J.  [place] to include hearing, vision, cognitive, depression L. Referrals and appointments - none  In addition, I have reviewed and discussed with patient certain preventive protocols, quality metrics, and best practice recommendations. A written personalized care plan for preventive services as well as general preventive health recommendations were provided to patient.  See attached scanned questionnaire for additional information.   Signed,   Lindell Noe, MHA, BS, LPN Health Coach'

## 2018-01-23 ENCOUNTER — Encounter: Payer: Medicare Other | Admitting: Family Medicine

## 2018-01-27 ENCOUNTER — Other Ambulatory Visit: Payer: Self-pay | Admitting: Neurology

## 2018-01-30 ENCOUNTER — Encounter: Payer: Self-pay | Admitting: Family Medicine

## 2018-01-30 ENCOUNTER — Ambulatory Visit (INDEPENDENT_AMBULATORY_CARE_PROVIDER_SITE_OTHER): Payer: Medicare Other | Admitting: Family Medicine

## 2018-01-30 VITALS — BP 122/80 | HR 101 | Temp 98.2°F | Ht 64.0 in | Wt 119.0 lb

## 2018-01-30 DIAGNOSIS — C50011 Malignant neoplasm of nipple and areola, right female breast: Secondary | ICD-10-CM

## 2018-01-30 DIAGNOSIS — M810 Age-related osteoporosis without current pathological fracture: Secondary | ICD-10-CM

## 2018-01-30 DIAGNOSIS — R300 Dysuria: Secondary | ICD-10-CM

## 2018-01-30 DIAGNOSIS — G20A1 Parkinson's disease without dyskinesia, without mention of fluctuations: Secondary | ICD-10-CM

## 2018-01-30 DIAGNOSIS — R109 Unspecified abdominal pain: Secondary | ICD-10-CM | POA: Diagnosis not present

## 2018-01-30 DIAGNOSIS — G20 Parkinson's disease: Secondary | ICD-10-CM

## 2018-01-30 MED ORDER — HYDROCODONE-ACETAMINOPHEN 5-325 MG PO TABS: 1.0000 | ORAL_TABLET | Freq: Four times a day (QID) | ORAL | 0 refills | Status: DC | PRN

## 2018-01-30 NOTE — Progress Notes (Signed)
Cardiology Office Note   Date:  01/31/2018   ID:  [name], [name] 1940-09-21, MRN [id number]  PCP:  Tonia Ghent, MD  Cardiologist:   Jenkins Rouge, MD   No chief complaint on file.     History of Present Illness: [name] is a 78 y.o. female who presents for f/u chest pain. First seen  2012 after right mastectomy and Dx of breast cancer and myeloma. Had atypical chest pain during XRT. Echos showed normal EF and no MVP trivial MR  despite patients reported history of such. Seen by Dr Tat 09/09/15 for movement disorder and tremor on carbidopa/levodopa and Effexor for depression.   Echo reviewed 09/23/15 normal EF trace MR no MVP Myovue 09/16/15 normal no ischemia EF 67%  Some peripheral neuropathy in legs  Sees [facility] for myeloma   Past Medical History:  Diagnosis Date  . Allergy    SEASONAL  . Anemia    Anemia-NOS / PMH, Dr Julien Nordmann  . Arthritis   . Breast cancer ([place])    stage I right breast  . Cataract    BILATERAL  . Decreased vision    R eye  . Esophageal Stricture   . Hyperlipidemia   . Monoclonal gammopathy   . MVP (mitral valve prolapse)   . Osteoporosis   . Parkinson disease ([place])   . Skin cancer    basal cell L neck  . Vertical diplopia     Past Surgical History:  Procedure Laterality Date  . APPENDECTOMY  2002  . BONE BIOPSY  11/06/10  . BREAST LUMPECTOMY  02/2010   right breast lumpectomy and sentinel node biopsy  . CARDIAC CATHETERIZATION  2005   neg  . CATARACT EXTRACTION, BILATERAL  2018  . COLONOSCOPY     negative   . HEMORRHOID SURGERY  1970's  . SALPINGOOPHORECTOMY  2002   for benign growths  . SHOULDER SURGERY Right   . TOTAL ABDOMINAL HYSTERECTOMY W/ BILATERAL SALPINGOOPHORECTOMY  78 yrs old   for pain,prolapse ; G3 P2  . UPPER GASTROINTESTINAL ENDOSCOPY       Current Outpatient Medications  Medication Sig Dispense Refill  . amantadine (SYMMETREL) 100 MG capsule Take 1 capsule (100 mg total) by mouth 2 (two)  times daily. 180 capsule 1  . aspirin 81 MG tablet Take 81 mg by mouth daily.      [name] b complex vitamins tablet Take 1 tablet by mouth daily.    . Calcium 1500 MG tablet Take 1,500 mg by mouth.      . carbidopa-levodopa (SINEMET IR) 25-100 MG tablet TAKE 1 & 1/2 (ONE & ONE-HALF) TABLETS BY MOUTH THREE TIMES DAILY 405 tablet 1  . Cholecalciferol 5000 units TABS Take 1 tablet (5,000 Units total) by mouth daily.    . clonazePAM (KLONOPIN) 0.5 MG tablet TAKE 1/2 (ONE-HALF) TABLET BY MOUTH AT BEDTIME 15 tablet 5  . Denosumab (PROLIA [place]) Inject into the skin every 6 (six) months.    . Elastic Bandages & Supports (ABDOMINAL BINDER/ELASTIC SMALL) MISC 1 Device by Does not apply route daily. 1 each 0  . Ferrous Sulfate (IRON) 325 (65 Fe) MG TABS Take 325 mg by mouth.    . fluticasone (FLONASE) 50 MCG/ACT nasal spray Place 2 sprays into both nostrils daily. 16 g 1  . HYDROcodone-acetaminophen (NORCO/VICODIN) 5-325 MG tablet Take 1 tablet by mouth every 6 (six) hours as needed for moderate pain (sedation caution). 20 tablet 0  . Multiple Vitamin (MULTIVITAMIN)  capsule 2 tabs po qd     . nitroGLYCERIN (NITROSTAT) 0.3 MG SL tablet Place 1 tablet (0.3 mg total) under the tongue every 5 (five) minutes as needed (max 3 doses). Reported on 04/07/2015 25 tablet 3  . Omega-3 Fatty Acids (OMEGA 3 PO) 1 tab po qd     . omeprazole (PRILOSEC) 20 MG capsule Take 1 capsule (20 mg total) by mouth daily. 90 capsule 3  . OnabotulinumtoxinA (BOTOX IM) Inject into the muscle every 3 (three) months. Injection every 3 months    . tamoxifen (NOLVADEX) 20 MG tablet TAKE 1 TABLET BY MOUTH ONCE DAILY 90 tablet 0  . venlafaxine XR (EFFEXOR-XR) 75 MG 24 hr capsule TAKE 1 CAPSULE BY MOUTH ONCE DAILY WITH BREAKFAST 90 capsule 1  . vitamin B-12 (CYANOCOBALAMIN) 500 MCG tablet Take 2500 mcg daily.     Current Facility-Administered Medications  Medication Dose Route Frequency Provider Last Rate Last Dose  . incobotulinumtoxinA (XEOMIN)  100 units injection 100 Units  100 Units Intramuscular Q90 days Tat, Eustace Quail, DO   100 Units at 07/22/17 1122    Allergies:   Penicillins    Social History:  The patient  reports that she has never smoked. She has never used smokeless tobacco. She reports current alcohol use of about 1.0 standard drinks of alcohol per week. She reports that she does not use drugs.   Family History:  The patient's family history includes Diabetes in her brother, brother, brother, and mother; Heart disease in her brother and mother; Hepatitis in her father.    ROS:  Please see the history of present illness.   Otherwise, review of systems are positive for none.   All other systems are reviewed and negative.    PHYSICAL EXAM: VS:  BP 108/64   Pulse 92   Ht 5\' 4"  (1.626 m)   Wt 120 lb 12.8 oz (54.8 kg)   SpO2 97%   BMI 20.74 kg/m  , BMI Body mass index is 20.74 kg/m. Chronically ill thin female Affect appropriate HEENT: masked facies  Neck supple with no adenopathy JVP normal no bruits no thyromegaly Lungs clear with no wheezing and good diaphragmatic motion Heart:  S1/S2 no murmur, no rub, gallop or click PMI normal Abdomen: benighn, BS positve, no tenderness, no AAA no bruit.  No HSM or HJR Distal pulses intact with no bruits No edema Neuro non-focal UE left greater than right tremor Skin warm and dry No muscular weakness    EKG:  03/03/10 SR rate 75 short PR nonspecific ST/T wave changes 01/31/17 SR rate 89 normal PR 110 msec  01/31/2018 SR rate 92 nonspecific ST changes    Recent Labs: 10/17/2017: ALT <6; BUN 14; Creatinine 0.79; Hemoglobin 11.4; Platelet Count 230; Potassium 3.8; Sodium 143 01/16/2018: TSH 4.50    Lipid Panel    Component Value Date/Time   CHOL 156 01/16/2018 1106   TRIG 62.0 01/16/2018 1106   HDL 67.60 01/16/2018 1106   CHOLHDL 2 01/16/2018 1106   VLDL 12.4 01/16/2018 1106   LDLCALC 76 01/16/2018 1106      Wt Readings from Last 3 Encounters:  01/31/18 120 lb  12.8 oz (54.8 kg)  01/30/18 119 lb (54 kg)  01/16/18 119 lb (54 kg)      Other studies Reviewed: Additional studies/ records that were reviewed today include: notes Dr Tat Cardiology records 2012 .Myovue 09/16/15 Echo 09/23/15     ASSESSMENT AND PLAN:  1. Chest pain atypical normal myovue EF  67% 09/16/15 observe  2. MVP by history no murmur echo 09/23/15 trace MR no MVP no need for SBE  3. Parkinson's f/u Tat  Ok to use Northera for postural hypotension in future Continue amantadine and sinemetT   Current medicines are reviewed at length with the patient today.  The patient does not have concerns regarding medicines.  The following changes have been made:  no change  Labs/ tests ordered today include:     Orders Placed This Encounter  Procedures  . EKG 12-Lead     Disposition:   FU with cardiology PRN    Signed, Jenkins Rouge, MD  01/31/2018 10:49 AM    [facility] [place], [place], [place]  [address] Phone: [phone]; Fax: [phone]

## 2018-01-30 NOTE — Progress Notes (Signed)
Osteoporosis on prolia.  Followed by oncology.  I will defer. History of breast cancer.  Per oncology.  Still on tamoxifen.    Parkinsons.  Per neurology.  She is doing the best she can to put up with her symptoms.  Compliant with medications.  Dysuria is better overall but she has had some occ leakage of urine. She doesn't have saddle numbness.    Tramadol helps some with back pain, it lasts for about 3 hours.  Not drowsy on med.  She still has some R lower back pain that can radiate around the R side of the abdomen. She has a lot of pain laying down and that is affecting her sleep.   Prev imaging d/w pt.  IMPRESSION: 1. Stable mild lumbar curvature convex to the left by 21 degrees. 2. Degenerative disc disease most marked at L1-2 and L2-3 levels.  She had noted audible pulse occ in her ears, unclear if noted more when her abdominal pain is worse.    Declined hearing aids.  Discussed with patient.  PMH and SH reviewed  ROS: Per HPI unless specifically indicated in ROS section   Meds, vitals, and allergies reviewed.   GEN: nad, alert and oriented, affect at baseline which is slightly flat as expected. HEENT: mucous membranes moist NECK: supple w/o LA CV: rrr PULM: ctab, no inc wob ABD: soft, +bs EXT: no edema SKIN: no acute rash Her lower back is tender to palpation on the right side in her lower abdomen is tender to palpation on the right side without rebound.  No masses appreciated.

## 2018-01-30 NOTE — Patient Instructions (Signed)
Stop tramadol.  Change to hydrocodone for pain.  Sedation caution.  Go to the lab on the way out.  We'll contact you with your lab report. Let me check on options about your back pain in the meantime.  Take care.  Glad to see you.

## 2018-01-31 ENCOUNTER — Encounter: Payer: Self-pay | Admitting: Cardiovascular Disease

## 2018-01-31 ENCOUNTER — Ambulatory Visit (INDEPENDENT_AMBULATORY_CARE_PROVIDER_SITE_OTHER): Payer: Medicare Other | Admitting: Cardiovascular Disease

## 2018-01-31 VITALS — BP 108/64 | HR 92 | Ht 64.0 in | Wt 120.8 lb

## 2018-01-31 DIAGNOSIS — R079 Chest pain, unspecified: Secondary | ICD-10-CM | POA: Diagnosis not present

## 2018-01-31 DIAGNOSIS — I340 Nonrheumatic mitral (valve) insufficiency: Secondary | ICD-10-CM | POA: Diagnosis not present

## 2018-01-31 LAB — URINE CULTURE
MICRO NUMBER:: 15701
Result:: NO GROWTH
SPECIMEN QUALITY:: ADEQUATE

## 2018-01-31 NOTE — Patient Instructions (Addendum)
Medication Instructions:   If you need a refill on your cardiac medications before your next appointment, please call your pharmacy.   Lab work:  If you have labs (blood work) drawn today and your tests are completely normal, you will receive your results only by: . MyChart Message (if you have MyChart) OR . A paper copy in the mail If you have any lab test that is abnormal or we need to change your treatment, we will call you to review the results.  Testing/Procedures: None ordered today.  Follow-Up: At [facility], you and your health needs are our priority.  As part of our continuing mission to provide you with exceptional heart care, we have created designated Provider Care Teams.  These Care Teams include your primary Cardiologist (physician) and Advanced Practice Providers (APPs -  Physician Assistants and Nurse Practitioners) who all work together to provide you with the care you need, when you need it. Your physician recommends that you schedule a follow-up appointment as needed with Dr. Nishan.   

## 2018-02-01 DIAGNOSIS — R109 Unspecified abdominal pain: Secondary | ICD-10-CM | POA: Insufficient documentation

## 2018-02-01 NOTE — Assessment & Plan Note (Addendum)
I think this is coming from degenerative changes in her back but I cannot be sure.  She does have dysuria.  Reasonable to check urine culture.  Assuming this is coming from her back she may need escalation of pain medication.  Discussed options.  Stop tramadol.  Change to hydrocodone.  Sedation cautions given.  Routine opiate precautions given.  She does have known degenerative changes in her lower back, most noted at L1-L2 and L2-L3. In the meantime, given her previous cancer history and history of MGUS, given the ongoing abdominal pain, reasonable to check CT abdomen pelvis.  See orders.  I routed this to Dr. Julien Nordmann with oncology as Juluis Rainier, and with appreciation for his input. >25 minutes spent in face to face time with patient, >50% spent in counselling or coordination of care.

## 2018-02-01 NOTE — Assessment & Plan Note (Signed)
Per neurology.  I will defer. 

## 2018-02-01 NOTE — Assessment & Plan Note (Signed)
Per oncology.  I will defer.

## 2018-02-02 ENCOUNTER — Other Ambulatory Visit (INDEPENDENT_AMBULATORY_CARE_PROVIDER_SITE_OTHER): Payer: Medicare Other

## 2018-02-02 ENCOUNTER — Other Ambulatory Visit: Payer: Self-pay | Admitting: Family Medicine

## 2018-02-02 DIAGNOSIS — R109 Unspecified abdominal pain: Secondary | ICD-10-CM | POA: Diagnosis not present

## 2018-02-02 LAB — BASIC METABOLIC PANEL
BUN: 20 mg/dL (ref 6–23)
CO2: 30 mEq/L (ref 19–32)
Calcium: 8.7 mg/dL (ref 8.4–10.5)
Chloride: 103 mEq/L (ref 96–112)
Creatinine, Ser: 0.66 mg/dL (ref 0.40–1.20)
GFR: 92.46 mL/min (ref >60.00)
Glucose, Bld: 105 mg/dL — ABNORMAL HIGH (ref 70–99)
Potassium: 3.8 mEq/L (ref 3.5–5.1)
Sodium: 139 mEq/L (ref 135–145)

## 2018-02-06 DIAGNOSIS — R159 Full incontinence of feces: Secondary | ICD-10-CM | POA: Diagnosis not present

## 2018-02-06 DIAGNOSIS — M545 Low back pain: Secondary | ICD-10-CM | POA: Diagnosis not present

## 2018-02-06 DIAGNOSIS — Z01419 Encounter for gynecological examination (general) (routine) without abnormal findings: Secondary | ICD-10-CM | POA: Diagnosis not present

## 2018-02-07 ENCOUNTER — Ambulatory Visit (INDEPENDENT_AMBULATORY_CARE_PROVIDER_SITE_OTHER)
Admission: RE | Admit: 2018-02-07 | Discharge: 2018-02-07 | Disposition: A | Discharge status: Home / Self Care | Payer: Medicare Other | Source: Ambulatory Visit | Attending: Family Medicine | Admitting: Family Medicine

## 2018-02-07 DIAGNOSIS — R109 Unspecified abdominal pain: Secondary | ICD-10-CM | POA: Diagnosis not present

## 2018-02-07 DIAGNOSIS — R195 Other fecal abnormalities: Secondary | ICD-10-CM | POA: Diagnosis not present

## 2018-02-07 MED ORDER — IOPAMIDOL (ISOVUE-300) INJECTION 61%
100.0000 mL | Freq: Once | INTRAVENOUS | Status: AC | PRN
  Administered 2018-02-07: 100 mL via INTRAVENOUS

## 2018-02-12 ENCOUNTER — Other Ambulatory Visit: Payer: Self-pay | Admitting: Family Medicine

## 2018-02-12 DIAGNOSIS — M47816 Spondylosis without myelopathy or radiculopathy, lumbar region: Secondary | ICD-10-CM

## 2018-02-14 DIAGNOSIS — Z853 Personal history of malignant neoplasm of breast: Secondary | ICD-10-CM | POA: Diagnosis not present

## 2018-02-14 LAB — HM MAMMOGRAPHY

## 2018-02-15 ENCOUNTER — Telehealth: Payer: Self-pay | Admitting: *Deleted

## 2018-02-15 NOTE — Telephone Encounter (Signed)
No call

## 2018-02-16 ENCOUNTER — Encounter: Payer: Self-pay | Admitting: Family Medicine

## 2018-02-22 DIAGNOSIS — M25551 Pain in right hip: Secondary | ICD-10-CM | POA: Diagnosis not present

## 2018-02-22 DIAGNOSIS — M545 Low back pain: Secondary | ICD-10-CM | POA: Diagnosis not present

## 2018-02-23 ENCOUNTER — Encounter: Payer: Self-pay | Admitting: Family Medicine

## 2018-02-25 DIAGNOSIS — M545 Low back pain: Secondary | ICD-10-CM | POA: Diagnosis not present

## 2018-02-27 DIAGNOSIS — M545 Low back pain: Secondary | ICD-10-CM | POA: Diagnosis not present

## 2018-03-10 ENCOUNTER — Other Ambulatory Visit: Payer: Self-pay | Admitting: Internal Medicine

## 2018-03-10 DIAGNOSIS — C50919 Malignant neoplasm of unspecified site of unspecified female breast: Secondary | ICD-10-CM

## 2018-03-15 DIAGNOSIS — M5126 Other intervertebral disc displacement, lumbar region: Secondary | ICD-10-CM | POA: Diagnosis not present

## 2018-03-15 DIAGNOSIS — M545 Low back pain: Secondary | ICD-10-CM | POA: Diagnosis not present

## 2018-03-15 DIAGNOSIS — M5136 Other intervertebral disc degeneration, lumbar region: Secondary | ICD-10-CM | POA: Diagnosis not present

## 2018-04-06 DIAGNOSIS — M5136 Other intervertebral disc degeneration, lumbar region: Secondary | ICD-10-CM | POA: Diagnosis not present

## 2018-04-06 DIAGNOSIS — M545 Low back pain: Secondary | ICD-10-CM | POA: Diagnosis not present

## 2018-04-06 DIAGNOSIS — M5126 Other intervertebral disc displacement, lumbar region: Secondary | ICD-10-CM | POA: Diagnosis not present

## 2018-04-13 DIAGNOSIS — M5126 Other intervertebral disc displacement, lumbar region: Secondary | ICD-10-CM | POA: Diagnosis not present

## 2018-04-13 DIAGNOSIS — M5136 Other intervertebral disc degeneration, lumbar region: Secondary | ICD-10-CM | POA: Diagnosis not present

## 2018-04-13 DIAGNOSIS — M5416 Radiculopathy, lumbar region: Secondary | ICD-10-CM | POA: Diagnosis not present

## 2018-04-14 ENCOUNTER — Encounter: Payer: Self-pay | Admitting: Neurology

## 2018-04-17 ENCOUNTER — Telehealth: Payer: Self-pay | Admitting: Medical Oncology

## 2018-04-17 ENCOUNTER — Inpatient Hospital Stay: Payer: Medicare Other | Attending: Internal Medicine

## 2018-04-17 ENCOUNTER — Inpatient Hospital Stay (HOSPITAL_BASED_OUTPATIENT_CLINIC_OR_DEPARTMENT_OTHER): Payer: Medicare Other | Admitting: Internal Medicine

## 2018-04-17 ENCOUNTER — Encounter: Payer: Self-pay | Admitting: Internal Medicine

## 2018-04-17 ENCOUNTER — Other Ambulatory Visit: Payer: Self-pay

## 2018-04-17 ENCOUNTER — Inpatient Hospital Stay: Payer: Medicare Other

## 2018-04-17 VITALS — BP 131/73 | HR 82 | Temp 98.3°F | Resp 18

## 2018-04-17 VITALS — BP 139/73 | HR 89 | Temp 98.8°F | Resp 18 | Ht 64.0 in | Wt 118.2 lb

## 2018-04-17 DIAGNOSIS — D472 Monoclonal gammopathy: Secondary | ICD-10-CM | POA: Insufficient documentation

## 2018-04-17 DIAGNOSIS — Z170 Estrogen receptor positive status [ER+]: Secondary | ICD-10-CM

## 2018-04-17 DIAGNOSIS — Z7982 Long term (current) use of aspirin: Secondary | ICD-10-CM | POA: Insufficient documentation

## 2018-04-17 DIAGNOSIS — Z85828 Personal history of other malignant neoplasm of skin: Secondary | ICD-10-CM

## 2018-04-17 DIAGNOSIS — G8929 Other chronic pain: Secondary | ICD-10-CM | POA: Diagnosis not present

## 2018-04-17 DIAGNOSIS — K219 Gastro-esophageal reflux disease without esophagitis: Secondary | ICD-10-CM

## 2018-04-17 DIAGNOSIS — M810 Age-related osteoporosis without current pathological fracture: Secondary | ICD-10-CM | POA: Insufficient documentation

## 2018-04-17 DIAGNOSIS — Z79810 Long term (current) use of selective estrogen receptor modulators (SERMs): Secondary | ICD-10-CM | POA: Insufficient documentation

## 2018-04-17 DIAGNOSIS — M545 Low back pain: Secondary | ICD-10-CM | POA: Diagnosis not present

## 2018-04-17 DIAGNOSIS — Z79899 Other long term (current) drug therapy: Secondary | ICD-10-CM

## 2018-04-17 DIAGNOSIS — C50011 Malignant neoplasm of nipple and areola, right female breast: Secondary | ICD-10-CM | POA: Diagnosis not present

## 2018-04-17 DIAGNOSIS — R5383 Other fatigue: Secondary | ICD-10-CM | POA: Diagnosis not present

## 2018-04-17 LAB — CMP (CANCER CENTER ONLY)
ALT: 23 U/L (ref 0–44)
AST: 24 U/L (ref 15–41)
Albumin: 3.7 g/dL (ref 3.5–5.0)
Alkaline Phosphatase: 49 U/L (ref 38–126)
Anion gap: 12 (ref 5–15)
BUN: 12 mg/dL (ref 8–23)
CO2: 27 mmol/L (ref 22–32)
Calcium: 9.1 mg/dL (ref 8.9–10.3)
Chloride: 105 mmol/L (ref 98–111)
Creatinine: 0.99 mg/dL (ref 0.44–1.00)
GFR, Est AFR Am: >60 mL/min (ref >60)
GFR, Estimated: 55 mL/min — ABNORMAL LOW (ref >60)
Glucose, Bld: 110 mg/dL — ABNORMAL HIGH (ref 70–99)
Potassium: 3.7 mmol/L (ref 3.5–5.1)
Sodium: 144 mmol/L (ref 135–145)
Total Bilirubin: 0.4 mg/dL (ref 0.3–1.2)
Total Protein: 7.4 g/dL (ref 6.5–8.1)

## 2018-04-17 LAB — CBC WITH DIFFERENTIAL (CANCER CENTER ONLY)
Abs Immature Granulocytes: 0.02 10*3/uL (ref 0.00–0.07)
Basophils Absolute: 0.1 10*3/uL (ref 0.0–0.1)
Basophils Relative: 1 %
Eosinophils Absolute: 0.1 10*3/uL (ref 0.0–0.5)
Eosinophils Relative: 2 %
HCT: 39.5 % (ref 36.0–46.0)
Hemoglobin: 12.6 g/dL (ref 12.0–15.0)
Immature Granulocytes: 0 %
Lymphocytes Relative: 29 %
Lymphs Abs: 2 10*3/uL (ref 0.7–4.0)
MCH: 32.4 pg (ref 26.0–34.0)
MCHC: 31.9 g/dL (ref 30.0–36.0)
MCV: 101.5 fL — ABNORMAL HIGH (ref 80.0–100.0)
Monocytes Absolute: 0.3 10*3/uL (ref 0.1–1.0)
Monocytes Relative: 5 %
Neutro Abs: 4.4 10*3/uL (ref 1.7–7.7)
Neutrophils Relative %: 63 %
Platelet Count: 219 10*3/uL (ref 150–400)
RBC: 3.89 MIL/uL (ref 3.87–5.11)
RDW: 14 % (ref 11.5–15.5)
WBC Count: 6.9 10*3/uL (ref 4.0–10.5)
nRBC: 0 % (ref 0.0–0.2)

## 2018-04-17 LAB — LACTATE DEHYDROGENASE: LDH: 198 U/L — ABNORMAL HIGH (ref 98–192)

## 2018-04-17 MED ORDER — DENOSUMAB 60 MG/ML ~~LOC~~ SOLN: 60.0000 mg | Freq: Once | SUBCUTANEOUS | Status: DC

## 2018-04-17 MED ORDER — DENOSUMAB 60 MG/ML ~~LOC~~ SOSY
PREFILLED_SYRINGE | SUBCUTANEOUS | Status: AC
  Filled 2018-04-17: qty 1

## 2018-04-17 MED ORDER — DENOSUMAB 60 MG/ML ~~LOC~~ SOSY
60.0000 mg | PREFILLED_SYRINGE | Freq: Once | SUBCUTANEOUS | Status: AC
  Administered 2018-04-17: 60 mg via SUBCUTANEOUS

## 2018-04-17 NOTE — Progress Notes (Signed)
[place] [phone]   [phone]) [phone]  OFFICE PROGRESS NOTE  Tonia Ghent, MD [name] [address]  DIAGNOSIS:  1) Node-negative breast cancer status post completion of radiation 05/06/2010 currently on tamoxifen as well as prolia.  2) Monoclonal gammopathy of unknown significance on observation since 2007   PRIOR THERAPY:  1) status post right lumpectomy on 03/05/2010 and it showed invasive ductal carcinoma 0.9 CM, Positive for ER/PR and negative for HER-2, with ductal carcinoma in situ present and negative sentinel lymph node biopsies.  2) status post adjuvant radiotherapy completed in April of 2012   CURRENT THERAPY:  1) tamoxifen 20 mg by mouth daily started in 2012.  2) Prolia subcutaneous injection every 6 months.  INTERVAL HISTORY: [name] 78 y.o. female returns to the clinic today for 6 months follow-up visit.  The patient is feeling fine today with no concerning complaints except for mild fatigue and low back pain.  She received epidural injection recently.  She denied having any current chest pain, shortness of breath, cough or hemoptysis.  She denied having any fever or chills.  She has no nausea, vomiting, diarrhea or constipation.  She continues to tolerate her treatment with tamoxifen fairly well.  The patient is here today for evaluation with repeat myeloma panel and also for Prolia injection.  MEDICAL HISTORY: Past Medical History:  Diagnosis Date  . Allergy    SEASONAL  . Anemia    Anemia-NOS / PMH, Dr Julien Nordmann  . Arthritis   . Breast cancer ([place])    stage I right breast  . Cataract    BILATERAL  . Decreased vision    R eye  . Esophageal Stricture   . Hyperlipidemia   . Monoclonal gammopathy   . MVP (mitral valve prolapse)   . Osteoporosis   . Parkinson disease ([place])   . Skin cancer    basal cell L neck  . Vertical diplopia     ALLERGIES:  is allergic to penicillins.   MEDICATIONS:  Current Outpatient Medications  Medication Sig Dispense Refill  . amantadine (SYMMETREL) 100 MG capsule Take 1 capsule (100 mg total) by mouth 2 (two) times daily. 180 capsule 1  . aspirin 81 MG tablet Take 81 mg by mouth daily.      [name] b complex vitamins tablet Take 1 tablet by mouth daily.    . Calcium 1500 MG tablet Take 1,500 mg by mouth.      . carbidopa-levodopa (SINEMET IR) 25-100 MG tablet TAKE 1 & 1/2 (ONE & ONE-HALF) TABLETS BY MOUTH THREE TIMES DAILY 405 tablet 1  . Cholecalciferol 5000 units TABS Take 1 tablet (5,000 Units total) by mouth daily.    . clonazePAM (KLONOPIN) 0.5 MG tablet TAKE 1/2 (ONE-HALF) TABLET BY MOUTH AT BEDTIME 15 tablet 5  . Denosumab (PROLIA Pitts) Inject into the skin every 6 (six) months.    . Elastic Bandages & Supports (ABDOMINAL BINDER/ELASTIC SMALL) MISC 1 Device by Does not apply route daily. 1 each 0  . Ferrous Sulfate (IRON) 325 (65 Fe) MG TABS Take 325 mg by mouth.    . fluticasone (FLONASE) 50 MCG/ACT nasal spray Place 2 sprays into both nostrils daily. 16 g 1  . HYDROcodone-acetaminophen (NORCO/VICODIN) 5-325 MG tablet Take 1 tablet by mouth every 6 (six) hours as needed for moderate pain (sedation caution). 20 tablet 0  . Multiple Vitamin (MULTIVITAMIN) capsule 2 tabs po qd     .  nitroGLYCERIN (NITROSTAT) 0.3 MG SL tablet Place 1 tablet (0.3 mg total) under the tongue every 5 (five) minutes as needed (max 3 doses). Reported on 04/07/2015 25 tablet 3  . Omega-3 Fatty Acids (OMEGA 3 PO) 1 tab po qd     . omeprazole (PRILOSEC) 20 MG capsule Take 1 capsule (20 mg total) by mouth daily. 90 capsule 3  . OnabotulinumtoxinA (BOTOX IM) Inject into the muscle every 3 (three) months. Injection every 3 months    . tamoxifen (NOLVADEX) 20 MG tablet TAKE 1 TABLET BY MOUTH ONCE DAILY 90 tablet 0  . venlafaxine XR (EFFEXOR-XR) 75 MG 24 hr capsule TAKE 1 CAPSULE BY MOUTH ONCE DAILY WITH BREAKFAST 90 capsule 1  . vitamin B-12 (CYANOCOBALAMIN) 500 MCG  tablet Take 2500 mcg daily.     Current Facility-Administered Medications  Medication Dose Route Frequency Provider Last Rate Last Dose  . incobotulinumtoxinA (XEOMIN) 100 units injection 100 Units  100 Units Intramuscular Q90 days Tat, Eustace Quail, DO   100 Units at 07/22/17 1122    SURGICAL HISTORY:  Past Surgical History:  Procedure Laterality Date  . APPENDECTOMY  2002  . BONE BIOPSY  11/06/10  . BREAST LUMPECTOMY  02/2010   right breast lumpectomy and sentinel node biopsy  . CARDIAC CATHETERIZATION  2005   neg  . CATARACT EXTRACTION, BILATERAL  2018  . COLONOSCOPY     negative   . HEMORRHOID SURGERY  1970's  . SALPINGOOPHORECTOMY  2002   for benign growths  . SHOULDER SURGERY Right   . TOTAL ABDOMINAL HYSTERECTOMY W/ BILATERAL SALPINGOOPHORECTOMY  78 yrs old   for pain,prolapse ; G3 P2  . UPPER GASTROINTESTINAL ENDOSCOPY      REVIEW OF SYSTEMS:  A comprehensive review of systems was negative except for: Constitutional: positive for fatigue Musculoskeletal: positive for arthralgias   PHYSICAL EXAMINATION: General appearance: alert, cooperative, fatigued and no distress Head: Normocephalic, without obvious abnormality, atraumatic Neck: no adenopathy, no JVD, supple, symmetrical, trachea midline and thyroid not enlarged, symmetric, no tenderness/mass/nodules Lymph nodes: Cervical, supraclavicular, and axillary nodes normal. Resp: clear to auscultation bilaterally Back: symmetric, no curvature. ROM normal. No CVA tenderness. Cardio: regular rate and rhythm, S1, S2 normal, no murmur, click, rub or gallop GI: soft, non-tender; bowel sounds normal; no masses,  no organomegaly Extremities: extremities normal, atraumatic, no cyanosis or edema   ECOG PERFORMANCE STATUS: 1 - Symptomatic but completely ambulatory  Blood pressure 139/73, pulse 89, temperature 98.8 F (37.1 C), temperature source Oral, resp. rate 18, height 5' 4"  (1.626 m), weight 118 lb 3.2 oz (53.6 kg), SpO2 100 %.   LABORATORY DATA: Lab Results  Component Value Date   WBC 6.9 04/17/2018   HGB 12.6 04/17/2018   HCT 39.5 04/17/2018   MCV 101.5 (H) 04/17/2018   PLT 219 04/17/2018      Chemistry      Component Value Date/Time   NA 144 04/17/2018 1037   NA 141 10/13/2016 1141   K 3.7 04/17/2018 1037   K 3.6 10/13/2016 1141   CL 105 04/17/2018 1037   CL 107 03/21/2012 1501   CO2 27 04/17/2018 1037   CO2 27 10/13/2016 1141   BUN 12 04/17/2018 1037   BUN 20.0 10/13/2016 1141   CREATININE 0.99 04/17/2018 1037   CREATININE 0.8 10/13/2016 1141      Component Value Date/Time   CALCIUM 9.1 04/17/2018 1037   CALCIUM 9.5 10/13/2016 1141   ALKPHOS 49 04/17/2018 1037   ALKPHOS 50 10/13/2016 1141  AST 24 04/17/2018 1037   AST 25 10/13/2016 1141   ALT 23 04/17/2018 1037   ALT 17 10/13/2016 1141   BILITOT 0.4 04/17/2018 1037   BILITOT 0.27 10/13/2016 1141       RADIOGRAPHIC STUDIES: No results found.  ASSESSMENT AND PLAN:  This is a very pleasant 78 years old white female with history of breast adenocarcinoma currently on tamoxifen as well as history of monoclonal gammopathy of undetermined significance currently on observation. The patient continues to do well with no concerning complaints except for the chronic low back pain. She had repeat myeloma panel performed earlier today.  The results are still pending. I recommended for the patient to continue on observation with repeat myeloma panel in 6 months if no concerning findings on the pending lab works. For the history of breast cancer, she will continue her current treatment with tamoxifen. For the bone disease, she will continue her treatment with Prolia every 6 months. The patient was advised to call immediately if she has any concerning symptoms in the interval. The patient voices understanding of current disease status and treatment options and is in agreement with the current care plan.  All questions were answered. The patient  knows to call the clinic with any problems, questions or concerns. We can certainly see the patient much sooner if necessary. I spent 10 minutes counseling the patient face to face. The total time spent in the appointment was 15 minutes.  Disclaimer: This note was dictated with voice recognition software. Similar sounding words can inadvertently be transcribed and may not be corrected upon review.

## 2018-04-17 NOTE — Telephone Encounter (Signed)
LVM to cancel appt today . I told pt if she was already on her way to keep appt.

## 2018-04-17 NOTE — Patient Instructions (Signed)

## 2018-04-17 NOTE — Addendum Note (Signed)
Addended by: Ardeen Garland on: 04/17/2018 11:49 AM   Modules accepted: Orders

## 2018-04-18 ENCOUNTER — Telehealth: Payer: Self-pay | Admitting: Internal Medicine

## 2018-04-18 LAB — KAPPA/LAMBDA LIGHT CHAINS
Kappa free light chain: 58.7 mg/L — ABNORMAL HIGH (ref 3.3–19.4)
Kappa, lambda light chain ratio: 14.68 — ABNORMAL HIGH (ref 0.26–1.65)
Lambda free light chains: 4 mg/L — ABNORMAL LOW (ref 5.7–26.3)

## 2018-04-18 LAB — IGG, IGA, IGM
IgA: 34 mg/dL — ABNORMAL LOW (ref 64–422)
IgG (Immunoglobin G), Serum: 1811 mg/dL — ABNORMAL HIGH (ref 700–1600)
IgM (Immunoglobulin M), Srm: 27 mg/dL (ref 26–217)

## 2018-04-18 LAB — BETA 2 MICROGLOBULIN, SERUM: Beta-2 Microglobulin: 1.6 mg/L (ref 0.6–2.4)

## 2018-04-18 NOTE — Telephone Encounter (Signed)
Left message re September appointments. Schedule mailed.  °

## 2018-04-20 ENCOUNTER — Ambulatory Visit: Payer: Medicare Other | Admitting: Neurology

## 2018-04-21 ENCOUNTER — Ambulatory Visit: Payer: Medicare Other | Admitting: Neurology

## 2018-04-21 ENCOUNTER — Encounter: Payer: Self-pay | Admitting: Neurology

## 2018-04-21 NOTE — Progress Notes (Signed)
Xeomin approval valid through 01/26/2019. Patient to use Buy and Bill.

## 2018-04-27 ENCOUNTER — Other Ambulatory Visit: Payer: Self-pay | Admitting: Family Medicine

## 2018-04-28 ENCOUNTER — Telehealth: Payer: Self-pay | Admitting: Occupational Therapy

## 2018-04-28 NOTE — Telephone Encounter (Signed)
[name]  was contacted today regarding temporary reduction of Outpatient Neuro Rehabilitation Services due to concerns for community transmission of COVID-19.  Pt was instructed to call back to reschedule PT/OT/ST evals for June. Patient is aware we can be reached by telephone during limited business hours in the meantime.  Theone Murdoch, OTR/L [phone] Phone: [phone] 12:35 PM 04/28/18

## 2018-05-02 ENCOUNTER — Encounter: Payer: Self-pay | Admitting: Neurology

## 2018-05-02 NOTE — Progress Notes (Signed)
Virtual Visit via Video Note The purpose of this virtual visit is to provide medical care while limiting exposure to the novel coronavirus.    Consent was obtained for video visit:  Yes.   Answered questions that patient had about telehealth interaction:  Yes.   I discussed the limitations, risks, security and privacy concerns of performing an evaluation and management service by telemedicine. I also discussed with the patient that there may be a patient responsible charge related to this service. The patient expressed understanding and agreed to proceed.  Pt location: Home Physician Location: office Name of referring provider:  Tonia Ghent, MD I connected with [name] at patients initiation/request on 05/03/2018 at 11:00 AM EDT by video enabled telemedicine application and verified that I am speaking with the correct person using two identifiers. Pt MRN:  [id number] Pt DOB:  11/16/1940 Video Participants:  [name];     History of Present Illness:  Patient is seen today in follow-up for Parkinson's disease.  She is on carbidopa/levodopa 25/100, 1.5 tablets 3 times per day and amantadine, 100 mg twice per day.  She finds amantadine very helpful, but does state that her lips are always moving.  Pt denies falls.  Pt has had a few episodes of lightheadedness, but no near syncope.  No hallucinations.  Mood has been good.  On effexor xr 75 mg daily.  She is walking for exercise.  Her last Xeomin injections for sialorrhea was on December 09, 2017.  This has helped.  Her next went had to be postponed because of the pandemic.  Records are reviewed since last visit, including her primary care records, cardiology and records from oncology, given her history of breast cancer.  Cardiology did note that it was okay to use Northera if patient needed it in the future.   Observations/Objective:   There were no vitals filed for this visit.  (Asked, but unable) GEN:  The patient appears  stated age and is in NAD.  Neurological examination:  Orientation: The patient is alert and oriented x3. Cranial nerves: There is good facial symmetry. There is mild facial hypomimia.  The speech is fluent and clear. Soft palate rises symmetrically and there is no tongue deviation. Hearing is intact to conversational tone. Motor: Strength is at least antigravity x 4.   Shoulder shrug is equal and symmetric.  There is no pronator drift.  Movement examination: Tone: unable Abnormal movements: She has fairly significant facial dyskinesia with lips moving much of the time Coordination:  There is mild decremation with RAM's, with finger taps on the left.  I was unable to see the feet.  She was during the e visit from her car (not moving) Gait and Station: The patient initially was walking at the beginning of the visit to the car.  Her walk looked good, but I was unable to see much of her feet, because she was doing the E visit from the phone that she was carrying.    Assessment and Plan:   1.  Idiopathic Parkinson's disease.  She was dx in May, 2015.               -continue carbidopa/levodopa 25/100, 1.5 tablets tid.             -We will try to increase her amantadine to 100 mg 3 times per day due to facial dyskinesia.  She will let me know if she has any side effects.  We discussed risks, benefits  and side effects of the medication.  I sent the medication to the pharmacy.  -Discussed in detail Covid-19 and risk factors for this, including age and PD.  Discussed importance of social distancing.  Discussed importance of staying home at all times, as is feasible.  Discussed taking advantage of grocery store hours for the elderly.  Pt expressed understanding. 2.  Depression             -doing well on Effexor XR, 75 mg daily.   4.  Dysphagia             -MBE on 04/21/15 was normal             -[name] did not think this was GI issue             -pt reports that this is resolved on 02/15/17 5.   Orthostatic hypotension             -Drinking more water now and feeling less dizzy             -Talked to her about Northera but overall, not getting lightheaded a lot.  Cardiology did approve this medication if we need it. 6.  REM behavior disorder             -doing better with klonopin - 0.5 mg - 1/2 tablet at night.   7.  Sialorrhea             -xeomin done on 12/09/17.  We will restart this once the pandemic is over.  Follow Up Instructions:    -I discussed the assessment and treatment plan with the patient. The patient was provided an opportunity to ask questions and all were answered. The patient agreed with the plan and demonstrated an understanding of the instructions.   The patient was advised to call back or seek an in-person evaluation if the symptoms worsen or if the condition fails to improve as anticipated.    Total Time spent in visit with the patient was:  25 min, of which more than 50% of the time was spent in counseling and/or coordinating care on safety.   Pt understands and agrees with the plan of care outlined.     Alonza Bogus, DO

## 2018-05-03 ENCOUNTER — Other Ambulatory Visit: Payer: Self-pay

## 2018-05-03 ENCOUNTER — Telehealth (INDEPENDENT_AMBULATORY_CARE_PROVIDER_SITE_OTHER): Payer: Medicare Other | Admitting: Neurology

## 2018-05-03 DIAGNOSIS — G249 Dystonia, unspecified: Secondary | ICD-10-CM | POA: Diagnosis not present

## 2018-05-03 DIAGNOSIS — G20 Parkinson's disease: Secondary | ICD-10-CM | POA: Diagnosis not present

## 2018-05-03 MED ORDER — AMANTADINE HCL 100 MG PO CAPS: 100.0000 mg | ORAL_CAPSULE | Freq: Three times a day (TID) | ORAL | 1 refills | Status: DC

## 2018-05-08 ENCOUNTER — Ambulatory Visit: Payer: Medicare Other | Admitting: Physical Therapy

## 2018-05-08 ENCOUNTER — Encounter: Payer: Medicare Other | Admitting: Occupational Therapy

## 2018-05-09 ENCOUNTER — Ambulatory Visit: Payer: Medicare Other | Admitting: Neurology

## 2018-05-09 DIAGNOSIS — M5126 Other intervertebral disc displacement, lumbar region: Secondary | ICD-10-CM | POA: Diagnosis not present

## 2018-05-09 DIAGNOSIS — M5136 Other intervertebral disc degeneration, lumbar region: Secondary | ICD-10-CM | POA: Diagnosis not present

## 2018-05-09 DIAGNOSIS — M545 Low back pain: Secondary | ICD-10-CM | POA: Diagnosis not present

## 2018-05-09 DIAGNOSIS — M5416 Radiculopathy, lumbar region: Secondary | ICD-10-CM | POA: Diagnosis not present

## 2018-05-16 ENCOUNTER — Other Ambulatory Visit: Payer: Medicare Other

## 2018-05-16 ENCOUNTER — Ambulatory Visit: Payer: Medicare Other | Admitting: Internal Medicine

## 2018-05-16 ENCOUNTER — Ambulatory Visit: Payer: Medicare Other

## 2018-06-05 ENCOUNTER — Other Ambulatory Visit: Payer: Self-pay | Admitting: Internal Medicine

## 2018-06-05 DIAGNOSIS — C50919 Malignant neoplasm of unspecified site of unspecified female breast: Secondary | ICD-10-CM

## 2018-06-06 ENCOUNTER — Other Ambulatory Visit: Payer: Self-pay | Admitting: Neurology

## 2018-07-17 ENCOUNTER — Other Ambulatory Visit: Payer: Self-pay | Admitting: Neurology

## 2018-07-17 NOTE — Telephone Encounter (Signed)
Requested Prescriptions   Pending Prescriptions Disp Refills  . clonazePAM (KLONOPIN) 0.5 MG tablet [Pharmacy Med Name: clonazePAM 0.5 MG Oral Tablet] 15 tablet 0    Sig: TAKE 1/2 (ONE-HALF) TABLET BY MOUTH AT BEDTIME   Rx last filled:12/12/17 #15 5 refills  Pt last seen:05/03/18  Follow up appt scheduled:08/11/18

## 2018-07-17 NOTE — Telephone Encounter (Signed)
Provider approved 

## 2018-08-11 ENCOUNTER — Other Ambulatory Visit: Payer: Self-pay

## 2018-08-11 ENCOUNTER — Ambulatory Visit (INDEPENDENT_AMBULATORY_CARE_PROVIDER_SITE_OTHER): Payer: Medicare Other | Admitting: Neurology

## 2018-08-11 DIAGNOSIS — G20 Parkinson's disease: Secondary | ICD-10-CM

## 2018-08-11 DIAGNOSIS — K117 Disturbances of salivary secretion: Secondary | ICD-10-CM | POA: Diagnosis not present

## 2018-08-11 DIAGNOSIS — R296 Repeated falls: Secondary | ICD-10-CM

## 2018-08-11 MED ORDER — INCOBOTULINUMTOXINA 100 UNITS IM SOLR
100.0000 [IU] | INTRAMUSCULAR | Status: DC
  Administered 2018-08-11 – 2018-12-15 (×2): 100 [IU] via INTRAMUSCULAR

## 2018-08-11 NOTE — Progress Notes (Signed)
[name] was seen today in the movement disorders clinic for neurologic consultation at the request of [name], MD.  The consultation is for the evaluation of tremor.  The records that were made available to me were reviewed.  No one accompanies the patient.   The first symptom(s) the patient noticed was unilateral hand tremor on the L and this was 4-5 months ago.  She is R hand dominant.  She states that it was intermittent when it started but now it is more constant.  No tremor elsewhere.    Only living relative with PD is a cousin, who is currently being treated at [facility].  08/17/13 update:  Pt states that tremor is improved on levodopa but not sure if it is otherwise helping (but may be).  Takes med at 8am/1pm/5-7pm.  No SE.  Blood pressure has been low but states that it is always low.  No lightheadedness.  Balance is not good, but no falls.  She completed therapy.  Is exercising at home.  She walks an hr a day 6 days per week and when she goes to the gym, she will use the treadmill.  Has signed up for PD exercise class.   11/20/13 update:  Pt f/u re: PD.  Is on carbidopa/levodopa 25/100 tid.  Is doing well.  No falls.  No lightheadedness.  No hallucinations.  Exercising faithfully.   Is having an aching pain in the shoulder and in the thumb region on the R side but PD primarily affects her on the L.  Some occasional tremor on the L.    04/10/14 update:  Pt is on carbidopa/levodopa 25/100 tid.  She is stable in regards to PD but unfortunately her husband just died 18 days ago.  She just started back exercising with her sister in law.  No SI/HI.  No falls.  No lightheadeness.  Some tremor but no more than usual.  08/12/14 update:  Pt is on carbidopa/levodopa 25/100 three times per day.  She has done PT/OT/ST since our last visit and just completed her last therapy session on 08/01/14.  She had an injection in her shoulder as well and then had therapy for that and it is feeling better.   No falls since our last visit.  No lightheadedness.  She is exercising faithfully.  She is walking for exercise 6 times a week.  She admits that she is crying a lot and states that it started before her husband died and sometimes it is crying over inconsequential stuff.  Sometimes, she cries over her husbands death but doesn't want medication for that.  She is not SI/HI.  12/16/14 update:  Pt is following up today and is still on carbidopa/levodopa 25/100 tid.   No significant changes since last visit.  She goes to the gym 6 days a week.  Sometimes, she will go two times a day.   She is going to be spending thanksgiving with her son.    Wearing off:  No.  How long before next dose:  n/a Falls:   No. N/V:  No. (had some nausea this past weekend but thinks related to food she ate) Hallucinations:  No.  visual distortions: No. Lightheaded:  Yes.  , just last few days  Syncope: No. Dyskinesia:  No.   04/15/15 update:  The patient presents today for follow-up.  She has a history of Parkinsons disease.  She is currently on carbidopa/levodopa 25/100, 3 times per day.  Overall, she  reports that she is doing well.  She denies any falls.  No hallucinations.  No lightheadedness or near syncope.  She is still exercising.  She started occupational therapy for her shoulder pain.  I saw that speech therapy recommended that she see GI because she had her esophagus structure in the past.  She has an appointment with [name] on March 30.  She states that she is having a little problem swallowing but "it isn't bad yet."  States that she is crying for unknown reason (watching the news, etc).  Doesn't think that it is depression.  09/09/15 update:  The patient follows up today.  I have reviewed multiple records since her last visit.  She remains on carbidopa/levodopa 25/100, one tablet 3 times per day.  Last visit, I was going to start her on Nuedexta, but it turns out that this interfered with her tamoxifen.  We ended  up starting her on Effexor XR, 75 mg daily.  The patient states that the crying spells resolved with the effexor.  She did have a modified barium swallow on 04/21/2015 that was normal.  She continued to have a globus sensation and saw [name] for this.  He felt that she had mild oropharyngeal dysphagia related to Parkinson's, but that was not demonstrated on her barium swallow.  He did do a CT of the abdomen that was negative.  The patient did go to the emergency room on 05/07/2015 with headache.  That has since resolved.  She has also been to Dr. [name] with lightheadedness, and also described vertigo to the physical therapist and they recommended vestibular rehabilitation.  She has been going to rehabilitation therapy.   That did help.  She has not had any falls.  No syncopal episodes.  No falls.  She does tell me she had surgery on 7/18 by [name] for rotator cuff.  She is still in a sling.    10/15/15 update:  The patient follows up today.  She is on carbidopa/levodopa 25/100, one tablet 3 times per day.  She is also on Effexor XR, 75 mg daily.  She feels that mood has been good.  She has significant orthostatic hypotension, but I was leery about giving her medication for this last visit because she was complaining about a lot of chest pain.  Not long after that, she saw [name], who ordered an echocardiogram which was performed on 09/23/2015 and was unremarkable.  Ejection fraction was 55-60%.  She had a nuclear stress test and there was no significant reversible ischemia. LVEF 67% with normal wall motion on the stress Myoview.  She did fall this past Sunday and hit her head.  She was putting on underwear and tripped and her face hit the floor.  Everytime she chews, her L face and jaw hurt.  She also has a bilateral frontal headache.  "It feels like a regular headache like I have had before."   Was told that she couldn't do therapy yesterday until she saw her doctor.  L shoulder is a bit sore.  Admits  that she hasn't been drinking quite as much water.  11/18/15 update:  The patient follows up today.  She remains on carbidopa/levodopa 25/100, one tablet 3 times per day.  She did have a CT of the brain and maxillofacial regions right after our last visit, due to a fall.  Fortunately, there was no intracranial abnormalities and no facial fractures.  She has recovered nicely.  She has had no  further falls.  Re: lightheaded, feeling a "little dizzy" with standing.  Thinks dizziness has not been "bad."  Wears abdominal binder some but makes her "hot."    Her mood is doing good on Effexor XR, 75 mg daily.  "I don't cry any more all of the time."  Going to PT and she is walking for exercise.  Is back to drinking about 5-6 bottles water per day.  She has started to note tremor in the RUE but not as much as in the LUE  02/18/16 update:  Patient follows up today, on carbidopa/levodopa 25/100, one tablet 3 times per day.  Overall, the patient states she has been stable.  She has had several falls since our last visit.  One time, she was vacuuming and moving backward and fell.  Two times, she fell on a curb.  She also fell backing up in the bathroom and she hit the toilet and the bathtub.  She got a "little hurt."  The last time she was dreaming and she fell out of the bed and hit her head on the floor.  That was about a week ago.  Denies LOC/alternation in consciousness.   Last PT for PD at end of July.  Had therapy for shoulder in November after shoulder surgery.   Occasional lightheadedness but no near syncope.  Mood has been good on Effexor XR, 75 mg daily.  05/21/16 update:  Patient seen today in follow-up.  She is on carbidopa/levodopa 25/100, one tablet 3 times per day.  Last visit, we started clonazepam 0.5 mg, half tablet at night for REM behavior disorder.  Reports that this has helped without side effects.  "I can't even tell I take it except I stay in the bed."  She has had no "real" falls since our last  visit.  She does tell me she slid off the bed once when she reached to get her remote control.  She then tells me about a fall backwards but she actually fell backward on the bed.     Her mood remains good on Effexor.  No hallucinations.  No lightheadedness or near syncope.  She is exercising daily at the gym.  She is in PT now.    09/29/16 update:  Patient seen in follow-up for her Parkinson's disease.  I increased her carbidopa/levodopa 25/100 last visit, so that she is now taking 1-1/2 tablets 3 times per day.  She states that she noted no big difference.  She is having no dyskinesia.  She is on clonazepam 0.5 mg, half tablet at night for REM behavior disorder.  This has worked well but she wonders if it causes hangover effects.   Her mood has been good with low dose Effexor, 75 mg daily.  She had cataracts extracted and she is happy with the results.  Dizziness has improved.  Not as much exercise as gym she was using closed.  02/15/17 update: Patient seen in follow-up for Parkinson's disease.  She is on carbidopa/levodopa 25/100, 1-1/2 tablets 3 times per day.  She had a fall in December and hurt her arm.  She was stepping backwards and tripped over something on the floor and fell on the fireplace on the arm.  She is still having pain in the arm that she fell arm. Around the same time, she fell off a friends porch.  She didn't get hurt that time.    No lightheadedness or near syncope.  Drinking water has improved her prior symptom of dizziness.  No hallucinations.  No visual distortions.  She remains on clonazepam 0.5 mg, half a tablet at night for REM behavior disorder.  Mood is good on Effexor, 75 mg daily.  On klonopin for RBD.  Awaiting prior auth and I wrote letter about this and submitted to her insurance company.    States that she has not been acting out the dreams.   I have reviewed records made available to me since last visit.  07/19/17 update: Patient is seen today in follow-up for Parkinson's  disease.  She remains on carbidopa/levodopa 25/100, 1.5 tablets 3 times per day.  She is still on clonazepam for REM behavior disorder (appealed with her insurance for it after last visit). Doing well with that.   She had therapy screens in February, 2019 and was determined that she did not need therapy at that time.  Records have been reviewed since last visit.  She also saw oncology for follow-up for breast cancer.  No treatment changes were made.  She had one fall since last visit.  She was getting wood and was stepping backward and tripped over bricks and fell in the wood pile and hit head.  Legs are feeling weaker/more tremulous.  Also noting abnormal movements of the face and tongue and sometimes has trouble speaking because of it.  Brings video of it.  "I slobber too."    12/06/17 update: Patient is seen today in follow-up for Parkinson's disease.  She is on carbidopa/levodopa 25/100, 1-1/2 tablets 3 times per day.  She was complaining about dyskinesia in the face last visit and we added amantadine, 100 mg twice per day.  She reports that it helped but the movements started some again about a month ago.  She had xeomin for sialorrhea at the end of June.  She states that it really helped.  She is on effexor XR, 75 mg daily for mood and mood has been good.  No SI/HI.  RBD doing well with low dose klonopin.  Had one near fall coming down stairs but didn't fall to ground because she ran into the fence.  She fell again coming down steps.  She has trouble with depth perception with steps.  08/11/2018 update: Patient was seen today originally just for Botox injections, but she began to mention multiple falls since last visit, some of them quite serious.  She actually fell on the concrete and hit her head.  She fell down the stairs.  She did not lose consciousness, but felt stunned when she hit her head on the concrete.  This was over a month ago.  I last saw her through telemedicine in April and to increase her  amantadine to 100 mg 3 times per day.  She did not feel that that was the source of the falls.  She was scheduled to go back to the neuro rehab center, and she stated that that was delayed because of COVID, but then she did not want to go back because she felt it was not safe.  She is still on carbidopa/levodopa 25/100, 1-1/2 tablets 3 times per day.  Neuroimaging has not previously been performed.  It was supposed to be done 06/01/13 but pt cancelled as she was claustrophobic and couldn't do the scan even though it was in an open unit.   PREVIOUS MEDICATIONS: none to date  ALLERGIES:   Allergies  Allergen Reactions   Penicillins Shortness Of Breath and Itching    CURRENT MEDICATIONS:  Current Outpatient Medications on File Prior  to Visit  Medication Sig Dispense Refill   amantadine (SYMMETREL) 100 MG capsule Take 1 capsule (100 mg total) by mouth 3 (three) times daily. 270 capsule 1   aspirin 81 MG tablet Take 81 mg by mouth daily.       b complex vitamins tablet Take 1 tablet by mouth daily.     Calcium 1500 MG tablet Take 1,500 mg by mouth.       carbidopa-levodopa (SINEMET IR) 25-100 MG tablet TAKE 1 & 1/2 (ONE & ONE-HALF) TABLETS BY MOUTH THREE TIMES DAILY 405 tablet 1   Cholecalciferol 5000 units TABS Take 1 tablet (5,000 Units total) by mouth daily.     clonazePAM (KLONOPIN) 0.5 MG tablet TAKE 1/2 (ONE-HALF) TABLET BY MOUTH AT BEDTIME 45 tablet 1   Denosumab (PROLIA [place]) Inject into the skin every 6 (six) months.     Elastic Bandages & Supports (ABDOMINAL BINDER/ELASTIC SMALL) MISC 1 Device by Does not apply route daily. 1 each 0   Ferrous Sulfate (IRON) 325 (65 Fe) MG TABS Take 325 mg by mouth.     fluticasone (FLONASE) 50 MCG/ACT nasal spray Place 2 sprays into both nostrils daily. 16 g 1   Multiple Vitamin (MULTIVITAMIN) capsule 2 tabs po qd      nitroGLYCERIN (NITROSTAT) 0.3 MG SL tablet Place 1 tablet (0.3 mg total) under the tongue every 5 (five) minutes as needed  (max 3 doses). Reported on 04/07/2015 (Patient not taking: Reported on 04/17/2018) 25 tablet 3   Omega-3 Fatty Acids (OMEGA 3 PO) 1 tab po qd      omeprazole (PRILOSEC) 20 MG capsule Take 1 capsule by mouth once daily 90 capsule 3   OnabotulinumtoxinA (BOTOX IM) Inject into the muscle every 3 (three) months. Injection every 3 months     tamoxifen (NOLVADEX) 20 MG tablet Take 1 tablet by mouth once daily 90 tablet 0   venlafaxine XR (EFFEXOR-XR) 75 MG 24 hr capsule TAKE 1 CAPSULE BY MOUTH ONCE DAILY WITH BREAKFAST 90 capsule 1   vitamin B-12 (CYANOCOBALAMIN) 500 MCG tablet Take 2500 mcg daily.     Current Facility-Administered Medications on File Prior to Visit  Medication Dose Route Frequency Provider Last Rate Last Dose   incobotulinumtoxinA (XEOMIN) 100 units injection 100 Units  100 Units Intramuscular Q90 days Rowene Suto, Eustace Quail, DO   100 Units at 07/22/17 1122    PAST MEDICAL HISTORY:   Past Medical History:  Diagnosis Date   Allergy    SEASONAL   Anemia    Anemia-NOS / PMH, Dr Julien Nordmann   Arthritis    Breast cancer [facility]    stage I right breast   Cataract    BILATERAL   Decreased vision    R eye   Esophageal Stricture    Hyperlipidemia    Monoclonal gammopathy    MVP (mitral valve prolapse)    Osteoporosis    Parkinson disease (HCC)    Skin cancer    basal cell L neck   Vertical diplopia     PAST SURGICAL HISTORY:   Past Surgical History:  Procedure Laterality Date   APPENDECTOMY  2002   BONE BIOPSY  11/06/10   BREAST LUMPECTOMY  02/2010   right breast lumpectomy and sentinel node biopsy   CARDIAC CATHETERIZATION  2005   neg   CATARACT EXTRACTION, BILATERAL  2018   COLONOSCOPY     negative    HEMORRHOID SURGERY  1970's   SALPINGOOPHORECTOMY  2002   for benign growths  SHOULDER SURGERY Right    TOTAL ABDOMINAL HYSTERECTOMY W/ BILATERAL SALPINGOOPHORECTOMY  78 yrs old   for pain,prolapse ; G3 P2   UPPER GASTROINTESTINAL ENDOSCOPY        SOCIAL HISTORY:   Social History   Socioeconomic History   Marital status: Widowed    Spouse name: Not on file   Number of children: 2   Years of education: Not on file   Highest education level: Not on file  Occupational History   Occupation: Retired  Scientist, product/process development strain: Not on file   Food insecurity    Worry: Not on file    Inability: Not on Lexicographer needs    Medical: Not on file    Non-medical: Not on file  Tobacco Use   Smoking status: Never Smoker   Smokeless tobacco: Never Used  Substance and Sexual Activity   Alcohol use: Yes    Alcohol/week: 1.0 standard drinks    Types: 1 Glasses of wine per week   Drug use: No   Sexual activity: Not Currently  Lifestyle   Physical activity    Days per week: Not on file    Minutes per session: Not on file   Stress: Not on file  Relationships   Social connections    Talks on phone: Not on file    Gets together: Not on file    Attends religious service: Not on file    Active member of club or organization: Not on file    Attends meetings of clubs or organizations: Not on file    Relationship status: Not on file   Intimate partner violence    Fear of current or ex partner: Not on file    Emotionally abused: Not on file    Physically abused: Not on file    Forced sexual activity: Not on file  Other Topics Concern   Not on file  Social History Narrative   Occupation: Textile   Widowed 2016, husband had cancer, was married in 1959    Patient has never smoked.    Illicit Drug Use - no   Patient does not get regular exercise.    Daily Caffeine Use: 1 cup of coffee daily    FAMILY HISTORY:   Family Status  Relation Name Status   Mother  Deceased       heart disease   Brother  Alive       diabetes   Brother  Deceased       heart disease, renal failure   Father  Deceased       hepatitis   Sister  Alive       diabetes   Brother  Deceased       drowned  as a teenager   Brother  Alive       diabetes   Brother  Alive       diabetes   Sister  Alive       diabetes, liver disease (alcohol)   Sister  Alive       healthy   Son  Alive       sarcoidosis   Daughter  Alive       healthy   Cousin  Alive       Parkinsons disease   Neg Hx  (Not Specified)    ROS:  Review of Systems  Constitutional: Negative.   HENT: Negative.   Eyes: Negative.   Respiratory: Negative.   Cardiovascular: Negative.  Gastrointestinal: Negative.   Musculoskeletal: Positive for falls.  Skin: Negative.      PHYSICAL EXAMINATION:    VITALS:   There were no vitals filed for this visit.  GEN:  The patient appears stated age and is in NAD. HEENT:  Normocephalic, atraumatic.  The mucous membranes are moist. The superficial temporal arteries are without ropiness or tenderness. CV:  RRR Lungs:  CTAB Neck/HEME:  There are no carotid bruits bilaterally.  Neurological examination:  Orientation: The patient is alert and oriented x3. Cranial nerves: There is good facial symmetry. The speech is fluent and clear. Soft palate rises symmetrically and there is no tongue deviation. Hearing is intact to conversational tone. Sensation: Sensation is intact to light touch throughout Motor: Strength is at least antigravity x4.  Movement examination: Tone: There is normal tone in the UE/LE Abnormal movements: no tremor.  There was facial dyskinesia. Coordination: Not tested today. Gait and Station: The patient pushes off of the chair to arise.  She was somewhat unsteady initially.  ASSESSMENT/PLAN:  1.  Idiopathic Parkinson's disease.  She was dx in May, 2015.    -continue carbidopa/levodopa 25/100, 1.5 tablets tid.  -continue amantadine, 100 mg tid  -Long discussion with the patient today regarding her multiple falls.  I really think she needs some physical therapy.  She just did not feel comfortable, either with in-home therapy or with going to the rehab  unit right now.  She stated that she really wanted [place] to be in phase 3 before she felt comfortable with that.  She stated that she would discuss this with me at her next appointment in early September. 2.  Depression  -doing well on Effexor XR, 75 mg daily.   4.  Dysphagia  -MBE on 04/21/15 was normal  -[name] did not think this was GI issue  -pt reports that this is resolved on 02/15/17 5.  Orthostatic hypotension  -Drinking more water now and feeling less dizzy  -Talked to her about Northera but orthostatics today are much better than in the past and worry about creating supine HTN.  Wear compression binder as much as possible, which she is doing.   6.  REM behavior disorder  -doing better with klonopin - 0.5 mg - 1/2 tablet at night.   7.  Sialorrhea  -xeomin done today and reported in a separate procedural note. 8.  Follow up is scheduled in September.

## 2018-08-11 NOTE — Procedures (Signed)
Botulinum Clinic   History:  Diagnosis: Sialorrhea associated with PD (icd10: K11.7)    Informed consent was obtained.  Discussed differences between myobloc and xeomin.  The patient was educated on the botulinum toxin the black blox warning and given a copy of the botox patient medication guide.  The patient understands that this warning states that there have been reported cases of the Botox extending beyond the injection site and creating adverse effects, similar to those of botulism. This included loss of strength, trouble walking, hoarseness, trouble saying words clearly, loss of bladder control, trouble breathing, trouble swallowing, diplopia, blurry vision and ptosis. Most of the distant spread of Botox was happening in patients, primarily children, who received medication for spasticity or for cervical dystonia. The patient expressed understanding and desire to proceed.   Injections  Location Left  Right Units Number of sites  Parotid (point 1 on picture) 30 30 60 1 per side  Submandibular (point 2) 20 20 40 1 per side  TOTAL UNITS:   100    Type of Toxin: Xeomin Discarded Units: 0  Needle drawback with each injection was free of blood. Pt tolerated procedure well without complications.   Reinjection is anticipated in 4 months.

## 2018-08-24 ENCOUNTER — Other Ambulatory Visit: Payer: Self-pay | Admitting: Family Medicine

## 2018-08-24 ENCOUNTER — Other Ambulatory Visit: Payer: Self-pay | Admitting: Neurology

## 2018-08-24 NOTE — Telephone Encounter (Signed)
Requested Prescriptions   Pending Prescriptions Disp Refills  . carbidopa-levodopa (SINEMET IR) 25-100 MG tablet [Pharmacy Med Name: Carbidopa-Levodopa 25-100 MG Oral Tablet] 405 tablet 0    Sig: TAKE 1 & 1/2 (ONE & ONE-HALF) TABLETS BY MOUTH THREE TIMES DAILY   Rx last filled: 01/27/18 #405 1 refills  Pt last seen: 05/03/18   Follow up appt scheduled:12/15/18

## 2018-08-24 NOTE — Telephone Encounter (Signed)
LOV 01/30/2018, fututre visit on 02/01/2019. IN LOV note Tramadol was taking out of med list. Patient states she is taking it, takes 1 tablet at bedtime. Please review

## 2018-08-25 MED ORDER — TRAMADOL HCL 50 MG PO TABS: 50.0000 mg | ORAL_TABLET | Freq: Every day | ORAL | 0 refills | Status: DC | PRN

## 2018-08-25 NOTE — Telephone Encounter (Signed)
Updated Tramadol directions. Prescription was sent in with 2 different directions. Per note below patient taking 1 tablet daily. Pharmacy advised.

## 2018-08-25 NOTE — Telephone Encounter (Signed)
Okay to continue if needed.  Thanks.

## 2018-08-25 NOTE — Addendum Note (Signed)
Addended by: Kris Mouton on: 08/25/2018 12:13 PM   Modules accepted: Orders

## 2018-09-13 ENCOUNTER — Other Ambulatory Visit: Payer: Self-pay | Admitting: Internal Medicine

## 2018-09-13 DIAGNOSIS — C50919 Malignant neoplasm of unspecified site of unspecified female breast: Secondary | ICD-10-CM

## 2018-09-26 NOTE — Progress Notes (Signed)
[name] was seen today in follow up for Parkinsons disease.  Pt is currently on carbidopa/levodopa 25/100, 1-1/2 tablets 3 times per day.  We increased her amantadine last visit to 100 mg 3 times per day for dyskinesia, particularly facial dyskinesia.  She reports that she fell after our botox appointment.  She missed the last step (she took the steps down).  She fell on the knee.  Asks me about skin discoloration in the legs.  Pt states that not having any falling out of the bed or active dreams with clonazepam.  Pt denies lightheadedness, near syncope.  Dizziness is better.  No hallucinations.  Mood has been good. Having some back pain after a fall a while back but also notes that body is "twisting" to the right.  Doing well with botox in terms of drooling.     PREVIOUS MEDICATIONS: Sinemet and Amantadine  ALLERGIES:   Allergies  Allergen Reactions  . Penicillins Shortness Of Breath and Itching    CURRENT MEDICATIONS:  Outpatient Encounter Medications as of 09/28/2018  Medication Sig  . amantadine (SYMMETREL) 100 MG capsule Take 1 capsule (100 mg total) by mouth 3 (three) times daily.  [name] aspirin 81 MG tablet Take 81 mg by mouth daily.    [name] b complex vitamins tablet Take 1 tablet by mouth daily.  . Calcium 1500 MG tablet Take 1,500 mg by mouth.    . carbidopa-levodopa (SINEMET IR) 25-100 MG tablet TAKE 1 & 1/2 (ONE & ONE-HALF) TABLETS BY MOUTH THREE TIMES DAILY  . Cholecalciferol 5000 units TABS Take 1 tablet (5,000 Units total) by mouth daily.  . clonazePAM (KLONOPIN) 0.5 MG tablet TAKE 1/2 (ONE-HALF) TABLET BY MOUTH AT BEDTIME  . Denosumab (PROLIA [place]) Inject into the skin every 6 (six) months.  . Elastic Bandages & Supports (ABDOMINAL BINDER/ELASTIC SMALL) MISC 1 Device by Does not apply route daily.  . Ferrous Sulfate (IRON) 325 (65 Fe) MG TABS Take 325 mg by mouth.  . fluticasone (FLONASE) 50 MCG/ACT nasal spray Place 2 sprays into both nostrils daily.  . Multiple Vitamin  (MULTIVITAMIN) capsule 2 tabs po qd   . nitroGLYCERIN (NITROSTAT) 0.3 MG SL tablet Place 1 tablet (0.3 mg total) under the tongue every 5 (five) minutes as needed (max 3 doses). Reported on 04/07/2015  . Omega-3 Fatty Acids (OMEGA 3 PO) 1 tab po qd   . omeprazole (PRILOSEC) 20 MG capsule Take 1 capsule by mouth once daily  . OnabotulinumtoxinA (BOTOX IM) Inject into the muscle every 3 (three) months. Injection every 3 months  . tamoxifen (NOLVADEX) 20 MG tablet Take 1 tablet by mouth once daily  . traMADol (ULTRAM) 50 MG tablet Take 1 tablet (50 mg total) by mouth daily as needed.  . venlafaxine XR (EFFEXOR-XR) 75 MG 24 hr capsule TAKE 1 CAPSULE BY MOUTH ONCE DAILY WITH BREAKFAST  . vitamin B-12 (CYANOCOBALAMIN) 500 MCG tablet Take 2500 mcg daily.   Facility-Administered Encounter Medications as of 09/28/2018  Medication  . incobotulinumtoxinA (XEOMIN) 100 units injection 100 Units  . incobotulinumtoxinA (XEOMIN) 100 units injection 100 Units    PAST MEDICAL HISTORY:   Past Medical History:  Diagnosis Date  . Allergy    SEASONAL  . Anemia    Anemia-NOS / PMH, Dr Julien Nordmann  . Arthritis   . Breast cancer ([place])    stage I right breast  . Cataract    BILATERAL  . Decreased vision    R eye  . Esophageal Stricture   .  Hyperlipidemia   . Monoclonal gammopathy   . MVP (mitral valve prolapse)   . Osteoporosis   . Parkinson disease ([place])   . Skin cancer    basal cell L neck  . Vertical diplopia     PAST SURGICAL HISTORY:   Past Surgical History:  Procedure Laterality Date  . APPENDECTOMY  2002  . BONE BIOPSY  11/06/10  . BREAST LUMPECTOMY  02/2010   right breast lumpectomy and sentinel node biopsy  . CARDIAC CATHETERIZATION  2005   neg  . CATARACT EXTRACTION, BILATERAL  2018  . COLONOSCOPY     negative   . HEMORRHOID SURGERY  1970's  . SALPINGOOPHORECTOMY  2002   for benign growths  . SHOULDER SURGERY Right   . TOTAL ABDOMINAL HYSTERECTOMY W/ BILATERAL SALPINGOOPHORECTOMY  78  yrs old   for pain,prolapse ; G3 P2  . UPPER GASTROINTESTINAL ENDOSCOPY      SOCIAL HISTORY:   Social History   Socioeconomic History  . Marital status: Widowed    Spouse name: Not on file  . Number of children: 2  . Years of education: Not on file  . Highest education level: Not on file  Occupational History  . Occupation: Retired  Scientific laboratory technician  . Financial resource strain: Not on file  . Food insecurity    Worry: Not on file    Inability: Not on file  . Transportation needs    Medical: Not on file    Non-medical: Not on file  Tobacco Use  . Smoking status: Never Smoker  . Smokeless tobacco: Never Used  Substance and Sexual Activity  . Alcohol use: Yes    Alcohol/week: 1.0 standard drinks    Types: 1 Glasses of wine per week  . Drug use: No  . Sexual activity: Not Currently  Lifestyle  . Physical activity    Days per week: Not on file    Minutes per session: Not on file  . Stress: Not on file  Relationships  . Social Herbalist on phone: Not on file    Gets together: Not on file    Attends religious service: Not on file    Active member of club or organization: Not on file    Attends meetings of clubs or organizations: Not on file    Relationship status: Not on file  . Intimate partner violence    Fear of current or ex partner: Not on file    Emotionally abused: Not on file    Physically abused: Not on file    Forced sexual activity: Not on file  Other Topics Concern  . Not on file  Social History Narrative   Occupation: Textile   Widowed 2016, husband had cancer, was married in 1959    Patient has never smoked.    Illicit Drug Use - no   Patient does not get regular exercise.    Daily Caffeine Use: 1 cup of coffee daily    FAMILY HISTORY:   Family Status  Relation Name Status  . Mother  Deceased       heart disease  . Brother  Alive       diabetes  . Brother  Deceased       heart disease, renal failure  . Father  Deceased        hepatitis  . Sister  Alive       diabetes  . Brother  Deceased       drowned as a  teenager  . Brother  Alive       diabetes  . Brother  Alive       diabetes  . Sister  Alive       diabetes, liver disease (alcohol)  . Sister  Alive       healthy  . Son  Alive       sarcoidosis  . Daughter  Alive       healthy  . Cousin  Alive       Parkinsons disease  . Neg Hx  (Not Specified)    ROS:  Review of Systems  Constitutional: Negative.   HENT: Negative.   Eyes: Negative.   Cardiovascular: Negative.   Gastrointestinal: Negative.   Genitourinary: Negative.   Skin: Negative.   Endo/Heme/Allergies: Negative.     PHYSICAL EXAMINATION:    VITALS:   Vitals:   09/28/18 1110  BP: (!) 156/84  Pulse: 82  SpO2: 100%  Weight: 122 lb (55.3 kg)  Height: 5\' 4"  (1.626 m)    GEN:  The patient appears stated age and is in NAD. HEENT:  Normocephalic, atraumatic.  The mucous membranes are moist. The superficial temporal arteries are without ropiness or tenderness. CV:  RRR Lungs:  CTAB Neck/HEME:  There are no carotid bruits bilaterally. Skin:  Has livedo reticularis on legs  Neurological examination:  Orientation: The patient is alert and oriented x3. Cranial nerves: There is good facial symmetry with facial hypomimia. The speech is fluent and clear. Soft palate rises symmetrically and there is no tongue deviation. Hearing is intact to conversational tone. Sensation: Sensation is intact to light touch throughout Motor: Strength is at least antigravity x4.  Movement examination: Tone: There is normal tone in the UE/LE today Abnormal movements: there is LUE resting tremor.  No facial dyskinesia today Coordination:  There is mild decremation with RAM's, esp with toe taps and heel taps bilaterally Gait and Station: The patient has no difficulty arising out of a deep-seated chair without the use of the hands. The patient's stride length is decreased.  She has some mild camptocormia to  the right.      Chemistry      Component Value Date/Time   NA 144 04/17/2018 1037   NA 141 10/13/2016 1141   K 3.7 04/17/2018 1037   K 3.6 10/13/2016 1141   CL 105 04/17/2018 1037   CL 107 03/21/2012 1501   CO2 27 04/17/2018 1037   CO2 27 10/13/2016 1141   BUN 12 04/17/2018 1037   BUN 20.0 10/13/2016 1141   CREATININE 0.99 04/17/2018 1037   CREATININE 0.8 10/13/2016 1141      Component Value Date/Time   CALCIUM 9.1 04/17/2018 1037   CALCIUM 9.5 10/13/2016 1141   ALKPHOS 49 04/17/2018 1037   ALKPHOS 50 10/13/2016 1141   AST 24 04/17/2018 1037   AST 25 10/13/2016 1141   ALT 23 04/17/2018 1037   ALT 17 10/13/2016 1141   BILITOT 0.4 04/17/2018 1037   BILITOT 0.27 10/13/2016 1141     Lab Results  Component Value Date   TSH 4.50 01/16/2018      ASSESSMENT/PLAN:  1. Idiopathic Parkinson's disease. She was dx in May, 2015.  -continue carbidopa/levodopa 25/100, 1.5 tablets tid. -I didn't see facial dyskinesia with amantadine, 100 mg 3 times per day, but the patient states that she still does have it.  Discussed the balance between livedo reticularis due to amantadine, and facial dyskinesia for which we are using amantadine.  Ultimately,  she wants to stay on the amantadine, knowing risks and benefits.  -refer to PD therapy at [facility] neuro[facility]hab. 2. Depression -doing well on Effexor XR, 75 mg daily.  3. Orthostatic hypotension -Drinking more water now and feeling less dizzy -Talked to her about Northera but overall, not getting lightheaded a lot.  Cardiology did approve this medication if we need it but symptoms resolved now and BP is higher now. 4. REM behavior disorder -doing better with klonopin - 0.5 mg - 1/2 tablet at night. Does not need a refill right now. 5. Sialorrhea -xeomin done on 08/11/18.    Next injections are scheduled on 12/15/2018.  Feels that this is helping 6.  Livedo  reticularis  -discussed that this is from amantadine.  Discussed that this can be permanent.  Pt states that she understands that.  She wants to stay on it because "I can cover up my legs but I can't cover up my face." 7.  Follow up is anticipated in the next 4-6 months, sooner should new neurologic issues arise.  Much greater than 50% of this visit was spent in counseling and coordinating care.  Total face to face time:  25 min  Cc:  Tonia Ghent, MD

## 2018-09-28 ENCOUNTER — Encounter: Payer: Self-pay | Admitting: Neurology

## 2018-09-28 ENCOUNTER — Other Ambulatory Visit: Payer: Self-pay

## 2018-09-28 ENCOUNTER — Ambulatory Visit (INDEPENDENT_AMBULATORY_CARE_PROVIDER_SITE_OTHER): Payer: Medicare Other | Admitting: Neurology

## 2018-09-28 VITALS — BP 156/84 | HR 82 | Ht 64.0 in | Wt 122.0 lb

## 2018-09-28 DIAGNOSIS — G20 Parkinson's disease: Secondary | ICD-10-CM

## 2018-09-28 DIAGNOSIS — R231 Pallor: Secondary | ICD-10-CM

## 2018-09-28 DIAGNOSIS — K117 Disturbances of salivary secretion: Secondary | ICD-10-CM | POA: Diagnosis not present

## 2018-09-28 DIAGNOSIS — G4752 REM sleep behavior disorder: Secondary | ICD-10-CM

## 2018-09-28 NOTE — Patient Instructions (Signed)
You have been referred to Neuro Rehab for therapy. They will call you directly to schedule an appointment.  Please call [phone] if you do not hear from them.

## 2018-10-09 DIAGNOSIS — Z23 Encounter for immunization: Secondary | ICD-10-CM | POA: Diagnosis not present

## 2018-10-16 ENCOUNTER — Ambulatory Visit: Payer: Medicare Other | Admitting: Internal Medicine

## 2018-10-16 ENCOUNTER — Inpatient Hospital Stay: Payer: Medicare Other | Attending: Internal Medicine

## 2018-10-16 ENCOUNTER — Other Ambulatory Visit: Payer: Self-pay

## 2018-10-16 ENCOUNTER — Ambulatory Visit: Payer: Medicare Other

## 2018-10-16 ENCOUNTER — Encounter: Payer: Self-pay | Admitting: Internal Medicine

## 2018-10-16 ENCOUNTER — Other Ambulatory Visit: Payer: Medicare Other

## 2018-10-16 ENCOUNTER — Inpatient Hospital Stay (HOSPITAL_BASED_OUTPATIENT_CLINIC_OR_DEPARTMENT_OTHER): Payer: Medicare Other | Admitting: Internal Medicine

## 2018-10-16 ENCOUNTER — Inpatient Hospital Stay: Payer: Medicare Other

## 2018-10-16 VITALS — BP 131/62 | HR 96 | Temp 97.8°F | Resp 18 | Ht 64.0 in | Wt 122.6 lb

## 2018-10-16 DIAGNOSIS — Z853 Personal history of malignant neoplasm of breast: Secondary | ICD-10-CM | POA: Diagnosis not present

## 2018-10-16 DIAGNOSIS — Z85828 Personal history of other malignant neoplasm of skin: Secondary | ICD-10-CM | POA: Diagnosis not present

## 2018-10-16 DIAGNOSIS — R5383 Other fatigue: Secondary | ICD-10-CM | POA: Diagnosis not present

## 2018-10-16 DIAGNOSIS — Z880 Allergy status to penicillin: Secondary | ICD-10-CM | POA: Diagnosis not present

## 2018-10-16 DIAGNOSIS — G20 Parkinson's disease: Secondary | ICD-10-CM | POA: Insufficient documentation

## 2018-10-16 DIAGNOSIS — R296 Repeated falls: Secondary | ICD-10-CM | POA: Insufficient documentation

## 2018-10-16 DIAGNOSIS — R51 Headache: Secondary | ICD-10-CM | POA: Insufficient documentation

## 2018-10-16 DIAGNOSIS — Z79899 Other long term (current) drug therapy: Secondary | ICD-10-CM | POA: Diagnosis not present

## 2018-10-16 DIAGNOSIS — C50011 Malignant neoplasm of nipple and areola, right female breast: Secondary | ICD-10-CM

## 2018-10-16 DIAGNOSIS — D472 Monoclonal gammopathy: Secondary | ICD-10-CM | POA: Insufficient documentation

## 2018-10-16 DIAGNOSIS — Z8719 Personal history of other diseases of the digestive system: Secondary | ICD-10-CM | POA: Insufficient documentation

## 2018-10-16 DIAGNOSIS — M1990 Unspecified osteoarthritis, unspecified site: Secondary | ICD-10-CM | POA: Diagnosis not present

## 2018-10-16 DIAGNOSIS — M6281 Muscle weakness (generalized): Secondary | ICD-10-CM | POA: Insufficient documentation

## 2018-10-16 DIAGNOSIS — Z79810 Long term (current) use of selective estrogen receptor modulators (SERMs): Secondary | ICD-10-CM | POA: Diagnosis not present

## 2018-10-16 DIAGNOSIS — G44329 Chronic post-traumatic headache, not intractable: Secondary | ICD-10-CM | POA: Diagnosis not present

## 2018-10-16 DIAGNOSIS — R0602 Shortness of breath: Secondary | ICD-10-CM | POA: Insufficient documentation

## 2018-10-16 LAB — CMP (CANCER CENTER ONLY)
ALT: 14 U/L (ref 0–44)
AST: 19 U/L (ref 15–41)
Albumin: 3.8 g/dL (ref 3.5–5.0)
Alkaline Phosphatase: 53 U/L (ref 38–126)
Anion gap: 7 (ref 5–15)
BUN: 10 mg/dL (ref 8–23)
CO2: 28 mmol/L (ref 22–32)
Calcium: 9 mg/dL (ref 8.9–10.3)
Chloride: 107 mmol/L (ref 98–111)
Creatinine: 0.81 mg/dL (ref 0.44–1.00)
GFR, Est AFR Am: >60 mL/min (ref >60)
GFR, Estimated: >60 mL/min (ref >60)
Glucose, Bld: 129 mg/dL — ABNORMAL HIGH (ref 70–99)
Potassium: 3.6 mmol/L (ref 3.5–5.1)
Sodium: 142 mmol/L (ref 135–145)
Total Bilirubin: 0.3 mg/dL (ref 0.3–1.2)
Total Protein: 7.1 g/dL (ref 6.5–8.1)

## 2018-10-16 LAB — CBC WITH DIFFERENTIAL (CANCER CENTER ONLY)
Abs Immature Granulocytes: 0.02 10*3/uL (ref 0.00–0.07)
Basophils Absolute: 0.1 10*3/uL (ref 0.0–0.1)
Basophils Relative: 1 %
Eosinophils Absolute: 0.4 10*3/uL (ref 0.0–0.5)
Eosinophils Relative: 5 %
HCT: 39.7 % (ref 36.0–46.0)
Hemoglobin: 12.9 g/dL (ref 12.0–15.0)
Immature Granulocytes: 0 %
Lymphocytes Relative: 31 %
Lymphs Abs: 2.3 10*3/uL (ref 0.7–4.0)
MCH: 33.2 pg (ref 26.0–34.0)
MCHC: 32.5 g/dL (ref 30.0–36.0)
MCV: 102.3 fL — ABNORMAL HIGH (ref 80.0–100.0)
Monocytes Absolute: 0.3 10*3/uL (ref 0.1–1.0)
Monocytes Relative: 4 %
Neutro Abs: 4.5 10*3/uL (ref 1.7–7.7)
Neutrophils Relative %: 59 %
Platelet Count: 209 10*3/uL (ref 150–400)
RBC: 3.88 MIL/uL (ref 3.87–5.11)
RDW: 14.3 % (ref 11.5–15.5)
WBC Count: 7.6 10*3/uL (ref 4.0–10.5)
nRBC: 0 % (ref 0.0–0.2)

## 2018-10-16 LAB — LACTATE DEHYDROGENASE: LDH: 167 U/L (ref 98–192)

## 2018-10-16 MED ORDER — DENOSUMAB 60 MG/ML ~~LOC~~ SOSY
60.0000 mg | PREFILLED_SYRINGE | Freq: Once | SUBCUTANEOUS | Status: AC
  Administered 2018-10-16: 60 mg via SUBCUTANEOUS

## 2018-10-16 MED ORDER — DENOSUMAB 60 MG/ML ~~LOC~~ SOSY
PREFILLED_SYRINGE | SUBCUTANEOUS | Status: AC
  Filled 2018-10-16: qty 1

## 2018-10-16 NOTE — Patient Instructions (Signed)
Denosumab injection What is this medicine? DENOSUMAB (den oh sue mab) slows bone breakdown. Prolia is used to treat osteoporosis in women after menopause and in men, and in people who are taking corticosteroids for 6 months or more. Delton See is used to treat a high calcium level due to cancer and to prevent bone fractures and other bone problems caused by multiple myeloma or cancer bone metastases. Delton See is also used to treat giant cell tumor of the bone. This medicine may be used for other purposes; ask your health care provider or pharmacist if you have questions. COMMON BRAND NAME(S): Prolia, XGEVA What should I tell my health care provider before I take this medicine? They need to know if you have any of these conditions:  dental disease  having surgery or tooth extraction  infection  kidney disease  low levels of calcium or Vitamin D in the blood  malnutrition  on hemodialysis  skin conditions or sensitivity  thyroid or parathyroid disease  an unusual reaction to denosumab, other medicines, foods, dyes, or preservatives  pregnant or trying to get pregnant  breast-feeding How should I use this medicine? This medicine is for injection under the skin. It is given by a health care professional in a hospital or clinic setting. A special MedGuide will be given to you before each treatment. Be sure to read this information carefully each time. For Prolia, talk to your pediatrician regarding the use of this medicine in children. Special care may be needed. For Delton See, talk to your pediatrician regarding the use of this medicine in children. While this drug may be prescribed for children as young as 13 years for selected conditions, precautions do apply. Overdosage: If you think you have taken too much of this medicine contact a poison control center or emergency room at once. NOTE: This medicine is only for you. Do not share this medicine with others. What if I miss a dose? It is  important not to miss your dose. Call your doctor or health care professional if you are unable to keep an appointment. What may interact with this medicine? Do not take this medicine with any of the following medications:  other medicines containing denosumab This medicine may also interact with the following medications:  medicines that lower your chance of fighting infection  steroid medicines like prednisone or cortisone This list may not describe all possible interactions. Give your health care provider a list of all the medicines, herbs, non-prescription drugs, or dietary supplements you use. Also tell them if you smoke, drink alcohol, or use illegal drugs. Some items may interact with your medicine. What should I watch for while using this medicine? Visit your doctor or health care professional for regular checks on your progress. Your doctor or health care professional may order blood tests and other tests to see how you are doing. Call your doctor or health care professional for advice if you get a fever, chills or sore throat, or other symptoms of a cold or flu. Do not treat yourself. This drug may decrease your body's ability to fight infection. Try to avoid being around people who are sick. You should make sure you get enough calcium and vitamin D while you are taking this medicine, unless your doctor tells you not to. Discuss the foods you eat and the vitamins you take with your health care professional. See your dentist regularly. Brush and floss your teeth as directed. Before you have any dental work done, tell your dentist you are  receiving this medicine. Do not become pregnant while taking this medicine or for 5 months after stopping it. Talk with your doctor or health care professional about your birth control options while taking this medicine. Women should inform their doctor if they wish to become pregnant or think they might be pregnant. There is a potential for serious side  effects to an unborn child. Talk to your health care professional or pharmacist for more information. What side effects may I notice from receiving this medicine? Side effects that you should report to your doctor or health care professional as soon as possible:  allergic reactions like skin rash, itching or hives, swelling of the face, lips, or tongue  bone pain  breathing problems  dizziness  jaw pain, especially after dental work  redness, blistering, peeling of the skin  signs and symptoms of infection like fever or chills; cough; sore throat; pain or trouble passing urine  signs of low calcium like fast heartbeat, muscle cramps or muscle pain; pain, tingling, numbness in the hands or feet; seizures  unusual bleeding or bruising  unusually weak or tired Side effects that usually do not require medical attention (report to your doctor or health care professional if they continue or are bothersome):  constipation  diarrhea  headache  joint pain  loss of appetite  muscle pain  runny nose  tiredness  upset stomach This list may not describe all possible side effects. Call your doctor for medical advice about side effects. You may report side effects to FDA at 1-800-FDA-1088. Where should I keep my medicine? This medicine is only given in a clinic, doctor's office, or other health care setting and will not be stored at home. NOTE: This sheet is a summary. It may not cover all possible information. If you have questions about this medicine, talk to your doctor, pharmacist, or health care provider.  2020 Elsevier/Gold Standard (2017-05-20 16:10:44)

## 2018-10-16 NOTE — Progress Notes (Signed)
[facility] [phone]   [phone]  OFFICE PROGRESS NOTE  Tonia Ghent, MD [name] [address]  DIAGNOSIS:  1) Node-negative breast cancer status post completion of radiation 05/06/2010 currently on tamoxifen as well as prolia.  2) Monoclonal gammopathy of unknown significance on observation since 2007   PRIOR THERAPY:  1) status post right lumpectomy on 03/05/2010 and it showed invasive ductal carcinoma 0.9 CM, Positive for ER/PR and negative for HER-2, with ductal carcinoma in situ present and negative sentinel lymph node biopsies.  2) status post adjuvant radiotherapy completed in April of 2012   CURRENT THERAPY:  1) tamoxifen 20 mg by mouth daily started in 2012.  2) Prolia subcutaneous injection every 6 months.  INTERVAL HISTORY: [name] 78 y.o. female returns to the clinic today for 6 months follow-up visit.  The patient is feeling fine today with no concerning complaints except for frequent falls in the last few months including one in July when she fell down around 9 steps and hit her head.  She has intermittent headache.  She did not seek any medical attention.  She is feeling tired and fatigued all the time.  She denied having any current chest pain but has shortness of breath with exertion with no cough or hemoptysis.  She denied having any fever or chills.  She has no nausea, vomiting, diarrhea or constipation.  She had repeat myeloma panel performed earlier today and she is here for evaluation and discussion of her lab results.  MEDICAL HISTORY: Past Medical History:  Diagnosis Date  . Allergy    SEASONAL  . Anemia    Anemia-NOS / PMH, Dr Julien Nordmann  . Arthritis   . Breast cancer ([place])    stage I right breast  . Cataract    BILATERAL  . Decreased vision    R eye  . Esophageal Stricture   . Hyperlipidemia   . Monoclonal gammopathy   . MVP (mitral valve prolapse)   . Osteoporosis   .  Parkinson disease ([place])   . Skin cancer    basal cell L neck  . Vertical diplopia     ALLERGIES:  is allergic to penicillins.  MEDICATIONS:  Current Outpatient Medications  Medication Sig Dispense Refill  . amantadine (SYMMETREL) 100 MG capsule Take 1 capsule (100 mg total) by mouth 3 (three) times daily. 270 capsule 1  . aspirin 81 MG tablet Take 81 mg by mouth daily.      [name] b complex vitamins tablet Take 1 tablet by mouth daily.    . Calcium 1500 MG tablet Take 1,500 mg by mouth.      . carbidopa-levodopa (SINEMET IR) 25-100 MG tablet TAKE 1 & 1/2 (ONE & ONE-HALF) TABLETS BY MOUTH THREE TIMES DAILY 405 tablet 0  . Cholecalciferol 5000 units TABS Take 1 tablet (5,000 Units total) by mouth daily.    . clonazePAM (KLONOPIN) 0.5 MG tablet TAKE 1/2 (ONE-HALF) TABLET BY MOUTH AT BEDTIME 45 tablet 1  . Denosumab (PROLIA ) Inject into the skin every 6 (six) months.    . Elastic Bandages & Supports (ABDOMINAL BINDER/ELASTIC SMALL) MISC 1 Device by Does not apply route daily. 1 each 0  . Ferrous Sulfate (IRON) 325 (65 Fe) MG TABS Take 325 mg by mouth.    . fluticasone (FLONASE) 50 MCG/ACT nasal spray Place 2 sprays into both nostrils daily. 16 g 1  . Multiple Vitamin (MULTIVITAMIN) capsule  2 tabs po qd     . nitroGLYCERIN (NITROSTAT) 0.3 MG SL tablet Place 1 tablet (0.3 mg total) under the tongue every 5 (five) minutes as needed (max 3 doses). Reported on 04/07/2015 25 tablet 3  . Omega-3 Fatty Acids (OMEGA 3 PO) 1 tab po qd     . omeprazole (PRILOSEC) 20 MG capsule Take 1 capsule by mouth once daily 90 capsule 3  . OnabotulinumtoxinA (BOTOX IM) Inject into the muscle every 3 (three) months. Injection every 3 months    . tamoxifen (NOLVADEX) 20 MG tablet Take 1 tablet by mouth once daily 90 tablet 0  . traMADol (ULTRAM) 50 MG tablet Take 1 tablet (50 mg total) by mouth daily as needed. 90 tablet 0  . venlafaxine XR (EFFEXOR-XR) 75 MG 24 hr capsule TAKE 1 CAPSULE BY MOUTH ONCE DAILY WITH  BREAKFAST 90 capsule 1  . vitamin B-12 (CYANOCOBALAMIN) 500 MCG tablet Take 2500 mcg daily.     Current Facility-Administered Medications  Medication Dose Route Frequency Provider Last Rate Last Dose  . incobotulinumtoxinA (XEOMIN) 100 units injection 100 Units  100 Units Intramuscular Q90 days Tat, Eustace Quail, DO   100 Units at 07/22/17 1122  . incobotulinumtoxinA (XEOMIN) 100 units injection 100 Units  100 Units Intramuscular Q4 months Tat, Eustace Quail, DO   100 Units at 08/11/18 1404    SURGICAL HISTORY:  Past Surgical History:  Procedure Laterality Date  . APPENDECTOMY  2002  . BONE BIOPSY  11/06/10  . BREAST LUMPECTOMY  02/2010   right breast lumpectomy and sentinel node biopsy  . CARDIAC CATHETERIZATION  2005   neg  . CATARACT EXTRACTION, BILATERAL  2018  . COLONOSCOPY     negative   . HEMORRHOID SURGERY  1970's  . SALPINGOOPHORECTOMY  2002   for benign growths  . SHOULDER SURGERY Right   . TOTAL ABDOMINAL HYSTERECTOMY W/ BILATERAL SALPINGOOPHORECTOMY  78 yrs old   for pain,prolapse ; G3 P2  . UPPER GASTROINTESTINAL ENDOSCOPY      REVIEW OF SYSTEMS:  A comprehensive review of systems was negative except for: Constitutional: positive for fatigue Musculoskeletal: positive for arthralgias and muscle weakness   PHYSICAL EXAMINATION: General appearance: alert, cooperative, fatigued and no distress Head: Normocephalic, without obvious abnormality, atraumatic Neck: no adenopathy, no JVD, supple, symmetrical, trachea midline and thyroid not enlarged, symmetric, no tenderness/mass/nodules Lymph nodes: Cervical, supraclavicular, and axillary nodes normal. Resp: clear to auscultation bilaterally Back: symmetric, no curvature. ROM normal. No CVA tenderness. Cardio: regular rate and rhythm, S1, S2 normal, no murmur, click, rub or gallop GI: soft, non-tender; bowel sounds normal; no masses,  no organomegaly Extremities: extremities normal, atraumatic, no cyanosis or edema  ECOG  PERFORMANCE STATUS: 1 - Symptomatic but completely ambulatory  Blood pressure 131/62, pulse 96, temperature 97.8 F (36.6 C), resp. rate 18, height _0  (1.626 m), weight 122 lb 9.6 oz (55.6 kg), SpO2 99 %.  LABORATORY DATA: Lab Results  Component Value Date   WBC 7.6 10/16/2018   HGB 12.9 10/16/2018   HCT 39.7 10/16/2018   MCV 102.3 (H) 10/16/2018   PLT 209 10/16/2018      Chemistry      Component Value Date/Time   NA 144 04/17/2018 1037   NA 141 10/13/2016 1141   K 3.7 04/17/2018 1037   K 3.6 10/13/2016 1141   CL 105 04/17/2018 1037   CL 107 03/21/2012 1501   CO2 27 04/17/2018 1037   CO2 27 10/13/2016 1141   BUN  12 04/17/2018 1037   BUN 20.0 10/13/2016 1141   CREATININE 0.99 04/17/2018 1037   CREATININE 0.8 10/13/2016 1141      Component Value Date/Time   CALCIUM 9.1 04/17/2018 1037   CALCIUM 9.5 10/13/2016 1141   ALKPHOS 49 04/17/2018 1037   ALKPHOS 50 10/13/2016 1141   AST 24 04/17/2018 1037   AST 25 10/13/2016 1141   ALT 23 04/17/2018 1037   ALT 17 10/13/2016 1141   BILITOT 0.4 04/17/2018 1037   BILITOT 0.27 10/13/2016 1141       RADIOGRAPHIC STUDIES: No results found.  ASSESSMENT AND PLAN:  This is a very pleasant 78 years old white female with history of breast adenocarcinoma currently on tamoxifen as well as history of monoclonal gammopathy of undetermined significance currently on observation. The patient is doing fine today with no concerning complaints except for the frequent falls and arthralgia. She had repeat myeloma panel performed earlier today.  CBC, comprehensive metabolic panel and LDH are unremarkable except for minor nonspecific changes.  The remaining myeloma panel is still pending. As the myeloma panel showed no concerning findings, I will see her back for follow-up visit in 1 year for evaluation with repeat myeloma panel. For the history of breast cancer, she will continue her current treatment with tamoxifen. For the bone disease, she  will continue her treatment with Prolia every 6 months. For the frequent falls, I will order CT scan of the head with and without contrast to rule out any intracranial abnormalities. The patient was advised to call immediately if she has any concerning symptoms in the interval. The patient voices understanding of current disease status and treatment options and is in agreement with the current care plan. All questions were answered. The patient knows to call the clinic with any problems, questions or concerns. We can certainly see the patient much sooner if necessary. I spent 10 minutes counseling the patient face to face. The total time spent in the appointment was 15 minutes.  Disclaimer: This note was dictated with voice recognition software. Similar sounding words can inadvertently be transcribed and may not be corrected upon review.

## 2018-10-17 LAB — CANCER ANTIGEN 27.29: CA 27.29: 24.4 U/mL (ref 0.0–38.6)

## 2018-10-17 LAB — IGG, IGA, IGM
IgA: 31 mg/dL — ABNORMAL LOW (ref 64–422)
IgG (Immunoglobin G), Serum: 1590 mg/dL (ref 586–1602)
IgM (Immunoglobulin M), Srm: 26 mg/dL (ref 26–217)

## 2018-10-17 LAB — KAPPA/LAMBDA LIGHT CHAINS
Kappa free light chain: 59 mg/L — ABNORMAL HIGH (ref 3.3–19.4)
Kappa, lambda light chain ratio: 6.28 — ABNORMAL HIGH (ref 0.26–1.65)
Lambda free light chains: 9.4 mg/L (ref 5.7–26.3)

## 2018-10-18 ENCOUNTER — Ambulatory Visit: Payer: Medicare Other | Attending: Neurology | Admitting: Physical Therapy

## 2018-10-18 ENCOUNTER — Encounter: Payer: Self-pay | Admitting: Physical Therapy

## 2018-10-18 ENCOUNTER — Telehealth: Payer: Self-pay | Admitting: Internal Medicine

## 2018-10-18 ENCOUNTER — Other Ambulatory Visit: Payer: Self-pay

## 2018-10-18 DIAGNOSIS — R29818 Other symptoms and signs involving the nervous system: Secondary | ICD-10-CM | POA: Diagnosis not present

## 2018-10-18 DIAGNOSIS — R293 Abnormal posture: Secondary | ICD-10-CM

## 2018-10-18 DIAGNOSIS — R2689 Other abnormalities of gait and mobility: Secondary | ICD-10-CM | POA: Diagnosis not present

## 2018-10-18 DIAGNOSIS — R2681 Unsteadiness on feet: Secondary | ICD-10-CM

## 2018-10-18 LAB — BETA 2 MICROGLOBULIN, SERUM: Beta-2 Microglobulin: 1.5 mg/L (ref 0.6–2.4)

## 2018-10-18 NOTE — Telephone Encounter (Signed)
Scheduled appt per 9/21 los - mailed letter with appt date and time   

## 2018-10-18 NOTE — Therapy (Signed)
[facility] [address] [place], [name], [address] Phone: [phone]   Fax:  [phone]  Physical Therapy Evaluation  Patient Details  Name: [name] MRN: [id number] Date of Birth: August 24, 1940 Referring Provider (PT): Tat, Wells Guiles   Encounter Date: 10/18/2018  PT End of Session - 10/18/18 2135    Visit Number  1    Number of Visits  9    Date for PT Re-Evaluation  01/16/19    Authorization Type  Medicare-will need 10th visit progress note    PT Start Time  1022    PT Stop Time  1101    PT Time Calculation (min)  39 min    Activity Tolerance  Patient tolerated treatment well    Behavior During Therapy  [facility] for tasks assessed/performed       Past Medical History:  Diagnosis Date  . Allergy    SEASONAL  . Anemia    Anemia-NOS / PMH, Dr Julien Nordmann  . Arthritis   . Breast cancer ([place])    stage I right breast  . Cataract    BILATERAL  . Decreased vision    R eye  . Esophageal Stricture   . Hyperlipidemia   . Monoclonal gammopathy   . MVP (mitral valve prolapse)   . Osteoporosis   . Parkinson disease ([place])   . Skin cancer    basal cell L neck  . Vertical diplopia     Past Surgical History:  Procedure Laterality Date  . APPENDECTOMY  2002  . BONE BIOPSY  11/06/10  . BREAST LUMPECTOMY  02/2010   right breast lumpectomy and sentinel node biopsy  . CARDIAC CATHETERIZATION  2005   neg  . CATARACT EXTRACTION, BILATERAL  2018  . COLONOSCOPY     negative   . HEMORRHOID SURGERY  1970's  . SALPINGOOPHORECTOMY  2002   for benign growths  . SHOULDER SURGERY Right   . TOTAL ABDOMINAL HYSTERECTOMY W/ BILATERAL SALPINGOOPHORECTOMY  78 yrs old   for pain,prolapse ; G3 P2  . UPPER GASTROINTESTINAL ENDOSCOPY      There were no vitals filed for this visit.   Subjective Assessment - 10/18/18 1026    Subjective  Have been falling alot.  I fell going up my steps (up on the top of the steps) and fell  backwards, hit the back of my head.  To have a CT scan next week due to head hurting in that area sometimes.  I have a tendency to fall backwards.  The week before last, I fell in the bathtub and on the grassy hill while turning.  Uses cane for mobility.  Takes a while to get up, but I finally can.    Patient Stated Goals  Pt's goals for therapy are to help back and knee; to keep from falling    Currently in Pain?  Yes    Pain Score  7     Pain Location  Back    Pain Orientation  Left    Pain Descriptors / Indicators  Aching    Pain Type  Acute pain   since fall in July   Pain Onset  More than a month ago    Pain Frequency  Intermittent    Aggravating Factors   Sitting too long, doing anything    Pain Relieving Factors  Change of positions, Ibuprofen    Effect of Pain on Daily Activities  PT will monitor pain, but will not specifically address as  a goal at this time.    Multiple Pain Sites  Yes    Pain Score  0   "20 out of a 10" when it hurts   Pain Location  Knee    Pain Orientation  Right    Pain Descriptors / Indicators  Sharp    Pain Onset  More than a month ago   2-3 months   Pain Frequency  Intermittent    Aggravating Factors   walking, falls    Pain Relieving Factors  sitting positions      PT will monitor pain, but will not address specifically as a goal at this time.  As knee pain is relatively new, PT encouraged pt to f/u with MD regarding pain.   [facility] PT Assessment - 10/18/18 1037      Assessment   Medical Diagnosis  Parkinson's disease, falls    Referring Provider (PT)  Tat, Wells Guiles    Onset Date/Surgical Date  09/28/18   MD visit   Hand Dominance  Right      Precautions   Precautions  Fall    Precaution Comments  Avoid R arm for BP      Balance Screen   Has the patient fallen in the past 6 months  Yes    How many times?  unsure   5 falls in past month   Has the patient had a decrease in activity level because of a fear of falling?   No    Is the patient  reluctant to leave their home because of a fear of falling?   No      Home Environment   Living Environment  Private residence    Living Arrangements  Alone    Type of [place] to enter    Entrance Stairs-Number of Steps  7    Entrance Stairs-Rails  Right;Left;Can reach both    [place]  One level    [place] - single point;Walker - 4 wheels   Tub shower; grab bar in shower     Prior Function   Level of Independence  Independent    Leisure  Prior to COVID-19, pt went to gym daily, treadmill, machines, walking daily for 30 minutes at the mall; Recent months, has tried to go back to gym (treadmill limited to 5 minutes); walking at the mall (cannot make it 30 minutes without stopping)      Posture/Postural Control   Posture/Postural Control  Postural limitations    Postural Limitations  Forward head;Rounded Shoulders   Leans to R in sitting     ROM / Strength   AROM / PROM / Strength  Strength      Strength   Overall Strength  Deficits    Strength Assessment Site  Hip;Knee;Ankle    Right/Left Hip  Right;Left    Right Hip Flexion  3+/5    Left Hip Flexion  4/5    Right/Left Knee  Right;Left    Right Knee Flexion  3+/5    Right Knee Extension  3+/5    Left Knee Flexion  4/5    Left Knee Extension  4/5    Right/Left Ankle  Right;Left    Right Ankle Dorsiflexion  3+/5    Left Ankle Dorsiflexion  3+/5      Transfers   Transfers  Sit to Stand;Stand to Sit    Sit to Stand  6: Modified independent (Device/Increase time);5: Supervision;With upper  extremity assist;From chair/3-in-1   Extra time for sit to stand, definite use of UEs   Stand to Sit  5: Supervision;With upper extremity assist;To chair/3-in-1      Ambulation/Gait   Ambulation/Gait  Yes    Ambulation/Gait Assistance  5: Supervision    Ambulation Distance (Feet)  120 Feet    Assistive device  Straight cane    Gait Pattern  Step-through pattern;Decreased step length -  right;Decreased step length - left;Decreased trunk rotation;Narrow base of support   extra steps to turn, imbalance with quick stops/turns   Ambulation Surface  Level;Indoor    Gait velocity  17.38 sec = 1.89 ft/sec    Stairs  Yes    Stairs Assistance  5: Supervision;4: Min guard    Stairs Assistance Details (indicate cue type and reason)  Decreased foot clearance    Stair Management Technique  Two rails;Alternating pattern;Forwards   Does not clear steps well   Number of Stairs  4    Height of Stairs  6      Standardized Balance Assessment   Standardized Balance Assessment  Timed Up and Go Test      Timed Up and Go Test   TUG  Normal TUG;Cognitive TUG    Normal TUG (seconds)  18.43    Cognitive TUG (seconds)  15.1    TUG Comments  Scores >13.5-15 seconds indicate increased fall risk      High Level Balance   High Level Balance Comments  SLS:  RLE 1.91 sec, LLE 11 sec.  Posterior push and release test:  would fall if unaided                Objective measurements completed on examination: See above findings.                PT Short Term Goals - 10/18/18 2145      PT SHORT TERM GOAL #1   Title  Pt will be independent with HEP for improved balance, strength, gait for improved functional mobility.  TARGET  for all STGs: 11/17/2018    Time  4    Period  Weeks    Status  New      PT SHORT TERM GOAL #2   Title  Pt will perform sit<>stand modified independently with minimal UE use and no posterior LOB, 4 of 5 trials, for improved safety and efficiency with transfers.    Time  4    Period  Weeks    Status  New      PT SHORT TERM GOAL #3   Title  Pt will improve TUG score to less than or equal to 15 seconds for decreased fall risk.    Time  4    Period  Weeks    Status  New      PT SHORT TERM GOAL #4   Title  Pt will negotiate at least 4 steps with handrails, modified independently, including standing safely at top of steps (to simulate opening door  without LOB) .    Time  4    Period  Weeks    Status  New        PT Long Term Goals - 10/18/18 2150      PT LONG TERM GOAL #1   Title  Pt will verbalize plans for ongoing community fitness upon d/c from PT.  TARGET 12/15/2018 (for all LTGs)    Time  8    Period  Weeks    Status  New      PT LONG TERM GOAL #2   Title  Pt will improve gait velocity score to at least 2 ft/sec with appropriate assistive device, for decreased fall risk.    Time  8    Period  Weeks    Status  New      PT LONG TERM GOAL #3   Title  Pt will improve TUG score to less than or equal to 13.5 seconds for decreased fall risk.    Time  8    Period  Weeks    Status  New      PT LONG TERM GOAL #4   Title  Pt will demo <2 steps to recover balance in posterior push and release test for improved balance recovery in posterior direction.    Time  8    Period  Weeks    Status  New      PT LONG TERM GOAL #5   Title  Pt will verbalize understanding of fall preveniton in home environment.    Time  8    Period  Weeks    Status  New             Plan - 10/18/18 2137    Clinical Impression Statement  Pt is a 78 year old female with history of Parkinson's disease, with history of falls.  Pt reports multiple falls in the past 6 months, with 2 falls posteriorly where she hit her head.  Pt presents with decreased balance, decreased timing and coordination of gait, postural instability, abnormal posture, decreased lower extremity strength; she is at fall risk per gait velocity and TUG scores.  She would fall if unaided in posterior push and release test.  She would beneift from skilled PT to address the above stated deficits for decreased fall risk and improved overall functional mobility.    Personal Factors and Comorbidities  Comorbidity 3+    Comorbidities  Hx of breast cancer, arthritis, osteoporosis    Examination-Activity Limitations  Stairs;Locomotion Level;Transfers    Examination-Participation Restrictions   Community Activity   Exercise   Stability/Clinical Decision Making  Evolving/Moderate complexity    Clinical Decision Making  Moderate    Rehab Potential  Good    PT Frequency  1x / week    PT Duration  8 weeks   plus eval   PT Treatment/Interventions  ADLs/Self Care Home Management;Gait training;Functional mobility training;Therapeutic activities;Therapeutic exercise;Balance training;DME Instruction;Patient/family education;Neuromuscular re-education    PT Next Visit Plan  Sit to stand training, funcitonal lower extremity strength as part of HEP; hip, ankle strategy, stagger stance and lateral weightshifting; gait training with starts/stops/turns; further assess R kne pain or ask if pt has f/u with MD regarding knee pain       Patient will benefit from skilled therapeutic intervention in order to improve the following deficits and impairments:  Abnormal gait, Decreased balance, Decreased mobility, Decreased strength, Difficulty walking, Postural dysfunction  Visit Diagnosis: Other abnormalities of gait and mobility  Unsteadiness on feet  Other symptoms and signs involving the nervous system  Abnormal posture     Problem List Patient Active Problem List   Diagnosis Date Noted  . Livedo reticularis 09/28/2018  . Abdominal pain 02/01/2018  . Low back pain 12/29/2017  . Dysuria 12/29/2017  . Edema 11/13/2017  . Right arm pain 02/03/2017  . Recurrent falls 02/03/2017  . Advance care planning 01/23/2017  . Hematuria 07/20/2016  . Headache 05/11/2015  . Globus sensation 04/24/2015  .  Foot pain 02/25/2015  . Shoulder pain 04/17/2014  . Parkinson's disease ([place]) 06/05/2013  . Medicare annual wellness visit, subsequent 05/02/2012  . Neck pain 05/02/2012  . Osteoporosis 03/08/2011  . Breast cancer ([place]) 11/19/2010  . GERD (gastroesophageal reflux disease) 08/01/2008  . DYSPHAGIA UNSPECIFIED 08/01/2008  . ORTHOSTATIC HYPOTENSION 06/28/2008  . HLD (hyperlipidemia) 07/10/2007   . MONOCLONAL GAMMOPATHY 07/10/2007  . ANEMIA-NOS 07/10/2007  . FATIGUE 07/10/2007    Shafer Swamy W. 10/18/2018, 9:53 PM Frazier Butt., PT  [facility] [address] [place], [name], [address] Phone: [phone]   Fax:  [phone]  Name: PEARLIA GUDGER MRN: [id number] Date of Birth: 11/08/1940

## 2018-10-24 ENCOUNTER — Ambulatory Visit (HOSPITAL_COMMUNITY)
Admission: RE | Admit: 2018-10-24 | Discharge: 2018-10-24 | Disposition: A | Discharge status: Home / Self Care | Payer: Medicare Other | Source: Ambulatory Visit | Attending: Internal Medicine | Admitting: Internal Medicine

## 2018-10-24 ENCOUNTER — Other Ambulatory Visit: Payer: Self-pay

## 2018-10-24 ENCOUNTER — Encounter (HOSPITAL_COMMUNITY): Payer: Self-pay

## 2018-10-24 DIAGNOSIS — R296 Repeated falls: Secondary | ICD-10-CM | POA: Diagnosis not present

## 2018-10-24 DIAGNOSIS — R410 Disorientation, unspecified: Secondary | ICD-10-CM | POA: Diagnosis not present

## 2018-10-24 DIAGNOSIS — G44329 Chronic post-traumatic headache, not intractable: Secondary | ICD-10-CM | POA: Diagnosis not present

## 2018-10-24 DIAGNOSIS — R51 Headache: Secondary | ICD-10-CM | POA: Diagnosis not present

## 2018-10-24 MED ORDER — IOHEXOL 300 MG/ML  SOLN
75.0000 mL | Freq: Once | INTRAMUSCULAR | Status: AC | PRN
  Administered 2018-10-24: 75 mL via INTRAVENOUS

## 2018-10-24 MED ORDER — SODIUM CHLORIDE (PF) 0.9 % IJ SOLN
INTRAMUSCULAR | Status: AC
  Filled 2018-10-24: qty 50

## 2018-10-26 ENCOUNTER — Ambulatory Visit: Payer: Medicare Other | Attending: Neurology | Admitting: Physical Therapy

## 2018-10-26 ENCOUNTER — Other Ambulatory Visit: Payer: Self-pay

## 2018-10-26 DIAGNOSIS — R2689 Other abnormalities of gait and mobility: Secondary | ICD-10-CM

## 2018-10-26 DIAGNOSIS — R29818 Other symptoms and signs involving the nervous system: Secondary | ICD-10-CM | POA: Diagnosis not present

## 2018-10-26 DIAGNOSIS — R2681 Unsteadiness on feet: Secondary | ICD-10-CM | POA: Diagnosis not present

## 2018-10-26 DIAGNOSIS — R293 Abnormal posture: Secondary | ICD-10-CM | POA: Diagnosis not present
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
## 2018-10-26 NOTE — Patient Instructions (Addendum)
[facility]  Access Code: [facility]  URL: [email or web address]  Date: 10/26/2018  Prepared by: Nita Sells   Exercises  Sit to Stand with Arm Reach Toward Target - 10 reps - 3x daily - 7x weekly  Supine Posterior Pelvic Tilt - 10 reps - 2x daily - 7x weekly

## 2018-11-02 ENCOUNTER — Ambulatory Visit: Payer: Medicare Other | Admitting: Physical Therapy

## 2018-11-09 ENCOUNTER — Other Ambulatory Visit: Payer: Self-pay

## 2018-11-09 ENCOUNTER — Ambulatory Visit: Payer: Medicare Other | Admitting: Physical Therapy

## 2018-11-09 ENCOUNTER — Encounter: Payer: Self-pay | Admitting: Physical Therapy

## 2018-11-09 DIAGNOSIS — R2681 Unsteadiness on feet: Secondary | ICD-10-CM | POA: Diagnosis not present

## 2018-11-09 DIAGNOSIS — R29818 Other symptoms and signs involving the nervous system: Secondary | ICD-10-CM | POA: Diagnosis not present

## 2018-11-09 DIAGNOSIS — R2689 Other abnormalities of gait and mobility: Secondary | ICD-10-CM

## 2018-11-09 DIAGNOSIS — R293 Abnormal posture: Secondary | ICD-10-CM | POA: Diagnosis not present

## 2018-11-10 ENCOUNTER — Ambulatory Visit: Payer: Medicare Other | Admitting: Neurology

## 2018-11-12 ENCOUNTER — Encounter: Payer: Self-pay | Admitting: Physical Therapy

## 2018-11-12 NOTE — Therapy (Signed)
[facility] [address] [place], [name], [address] Phone: [phone]   Fax:  [phone]  Physical Therapy Treatment  Patient Details  Name: [name] MRN: [id number] Date of Birth: [birth year] Referring Provider (PT): Tat, Wells Guiles   Encounter Date: 11/09/2018  PT End of Session - 11/12/18 1102    Visit Number  3    Number of Visits  9    Date for PT Re-Evaluation  01/16/19    Authorization Type  Medicare-will need 10th visit progress note    PT Start Time  1321    PT Stop Time  1400    PT Time Calculation (min)  39 min    Activity Tolerance  Patient tolerated treatment well    Behavior During Therapy  [facility] for tasks assessed/performed       Past Medical History:  Diagnosis Date  . Allergy    SEASONAL  . Anemia    Anemia-NOS / PMH, Dr Julien Nordmann  . Arthritis   . Breast cancer ([place])    stage I right breast  . Cataract    BILATERAL  . Decreased vision    R eye  . Esophageal Stricture   . Hyperlipidemia   . Monoclonal gammopathy   . MVP (mitral valve prolapse)   . Osteoporosis   . Parkinson disease ([place])   . Skin cancer    basal cell L neck  . Vertical diplopia     Past Surgical History:  Procedure Laterality Date  . APPENDECTOMY  2002  . BONE BIOPSY  11/06/10  . BREAST LUMPECTOMY  02/2010   right breast lumpectomy and sentinel node biopsy  . CARDIAC CATHETERIZATION  2005   neg  . CATARACT EXTRACTION, BILATERAL  2018  . COLONOSCOPY     negative   . HEMORRHOID SURGERY  1970's  . SALPINGOOPHORECTOMY  2002   for benign growths  . SHOULDER SURGERY Right   . TOTAL ABDOMINAL HYSTERECTOMY W/ BILATERAL SALPINGOOPHORECTOMY  78 yrs old   for pain,prolapse ; G3 P2  . UPPER GASTROINTESTINAL ENDOSCOPY      There were no vitals filed for this visit.  Subjective Assessment - 11/12/18 1047    Subjective  Had two falls in one day, falling backwards and hitting my back.  Have not called the  orthopedic MD about my knee pain.    Patient Stated Goals  Pt's goals for therapy are to help back and knee; to keep from falling    Currently in Pain?  Yes    Pain Score  8     Pain Location  Back    Pain Orientation  Posterior;Right;Lower    Pain Descriptors / Indicators  Aching   tender to palpation   Pain Type  Acute pain   from recent falls   Pain Onset  More than a month ago    Pain Frequency  Occasional    Aggravating Factors   sitting too  long, fall    Pain Relieving Factors  rest    Pain Onset  More than a month ago   2-3 months                      [facility] Adult PT Treatment/Exercise - 11/12/18 1052      Transfers   Transfers  Sit to Stand;Stand to Sit    Sit to Stand  5: Supervision;4: Min assist;With upper extremity assist;From chair/3-in-1    Sit to Stand Details  Tactile  cues for weight shifting;Verbal cues for technique;Manual facilitation for weight shifting;Tactile cues for weight beaing    Sit to Stand Details (indicate cue type and reason)  REview of HEP from last visit, cues for increased forward lean for sit<>stand    Stand to Sit  5: Supervision;With upper extremity assist;To bed;To chair/3-in-1    Number of Reps  10 reps    Comments  Additional multiple reps practiced throughout session, with cues for widened BOS, for increased forward lean, upright posture upon standing for improved balance.      Ambulation/Gait   Ambulation/Gait  Yes    Ambulation/Gait Assistance  4: Min assist;4: Min guard    Ambulation/Gait Assistance Details  Cues for widened BOS and for wider cane placement    Ambulation Distance (Feet)  100 Feet   x 2; 115 ft x 2   Assistive device  Straight cane    Gait Pattern  Step-through pattern;Decreased step length - right;Decreased step length - left;Decreased trunk rotation;Narrow base of support;Scissoring    Ambulation Surface  Level;Indoor    Gait Comments  Gait with stops/stars, with cues to stop with widened BOS and  correct posture, then start again with widened BOS.        High Level Balance   High Level Balance Activities  Weight-shifting turns;Marching turns    High Level Balance Comments  Practiced side step (quarter turns) with counter/chair support-practicing 90 degree and 180 degree turns, return to center, both directions.  Pt needs cues for technique, for widened BOS; marching and weigthshifting turns in place with UE support of cane, with cues for widened BOS and min guard for safety.      Self-Care   Self-Care  Other Self-Care Comments    Other Self-Care Comments   Discussed again pt's reports of R knee pain and instability at times, to contact orthopedic MD for further assessment.  In addition, instructed patient to monitor back pain from recent fall (as it is very tender to palpation); if pain worsens or she has difficulty with movement/breathing due to pain, she needs to contact MD to follow up.     Discussed gait safety with use of rollator walker versus cane, especially with recent multiple falls, but pt does not generally use her rollator.     Balance Exercises - 11/12/18 1053      Balance Exercises: Standing   Stepping Strategy  Posterior;10 reps;UE support   At counter, cues for widened BOS   Heel Raises Limitations  x 10    Toe Raise Limitations  x 10    Other Standing Exercises  Widened BOS lateral weigthshifting x 10 reps          PT Short Term Goals - 10/18/18 2145      PT SHORT TERM GOAL #1   Title  Pt will be independent with HEP for improved balance, strength, gait for improved functional mobility.  TARGET  for all STGs: 11/17/2018    Time  4    Period  Weeks    Status  New      PT SHORT TERM GOAL #2   Title  Pt will perform sit<>stand modified independently with minimal UE use and no posterior LOB, 4 of 5 trials, for improved safety and efficiency with transfers.    Time  4    Period  Weeks    Status  New      PT SHORT TERM GOAL #3   Title  Pt will improve  TUG  score to less than or equal to 15 seconds for decreased fall risk.    Time  4    Period  Weeks    Status  New      PT SHORT TERM GOAL #4   Title  Pt will negotiate at least 4 steps with handrails, modified independently, including standing safely at top of steps (to simulate opening door without LOB) .    Time  4    Period  Weeks    Status  New        PT Long Term Goals - 10/18/18 2150      PT LONG TERM GOAL #1   Title  Pt will verbalize plans for ongoing community fitness upon d/c from PT.  TARGET 12/15/2018 (for all LTGs)    Time  8    Period  Weeks    Status  New      PT LONG TERM GOAL #2   Title  Pt will improve gait velocity score to at least 2 ft/sec with appropriate assistive device, for decreased fall risk.    Time  8    Period  Weeks    Status  New      PT LONG TERM GOAL #3   Title  Pt will improve TUG score to less than or equal to 13.5 seconds for decreased fall risk.    Time  8    Period  Weeks    Status  New      PT LONG TERM GOAL #4   Title  Pt will demo <2 steps to recover balance in posterior push and release test for improved balance recovery in posterior direction.    Time  8    Period  Weeks    Status  New      PT LONG TERM GOAL #5   Title  Pt will verbalize understanding of fall preveniton in home environment.    Time  8    Period  Weeks    Status  New            Plan - 11/12/18 1102    Clinical Impression Statement  Pt has had at least 2 falls in recent days, resulting in back pain.  Educated patient again on follow up with MD due to previous knee pain and now new back pain.  Also educated on gait safety with use of rollator for improved stability. Focused session today on reinforcing proper sit to stand technique and balance, turning methods.  Pt will continue to benefit from skilled PT to address blaance, gait safety to reduce future falls.    Personal Factors and Comorbidities  Comorbidity 3+    Comorbidities  Hx of breast cancer,  arthritis, osteoporosis    Examination-Activity Limitations  Stairs;Locomotion Level;Transfers    Examination-Participation Restrictions  Community Activity   Exercise   Stability/Clinical Decision Making  Evolving/Moderate complexity    Rehab Potential  Good    PT Frequency  1x / week    PT Duration  8 weeks   plus eval   PT Treatment/Interventions  ADLs/Self Care Home Management;Gait training;Functional mobility training;Therapeutic activities;Therapeutic exercise;Balance training;DME Instruction;Patient/family education;Neuromuscular re-education    PT Next Visit Plan  Check again:  Did she call ortho md?  Review Sit to stand training, funcitonal lower extremity strength as part of HEP; hip, ankle strategy, stagger stance and lateral weightshifting; gait training with starts/stops/turns; CHECK STGs    PT Home Exercise Plan  Access Code: [facility]  Consulted and Agree with Plan of Care  Patient       Patient will benefit from skilled therapeutic intervention in order to improve the following deficits and impairments:  Abnormal gait, Decreased balance, Decreased mobility, Decreased strength, Difficulty walking, Postural dysfunction  Visit Diagnosis: Unsteadiness on feet  Other abnormalities of gait and mobility     Problem List Patient Active Problem List   Diagnosis Date Noted  . Livedo reticularis 09/28/2018  . Abdominal pain 02/01/2018  . Low back pain 12/29/2017  . Dysuria 12/29/2017  . Edema 11/13/2017  . Right arm pain 02/03/2017  . Recurrent falls 02/03/2017  . Advance care planning 01/23/2017  . Hematuria 07/20/2016  . Headache 05/11/2015  . Globus sensation 04/24/2015  . Foot pain 02/25/2015  . Shoulder pain 04/17/2014  . Parkinson's disease ([place]) 06/05/2013  . Medicare annual wellness visit, subsequent 05/02/2012  . Neck pain 05/02/2012  . Osteoporosis 03/08/2011  . Breast cancer ([place]) 11/19/2010  . GERD (gastroesophageal reflux disease) 08/01/2008  .  DYSPHAGIA UNSPECIFIED 08/01/2008  . ORTHOSTATIC HYPOTENSION 06/28/2008  . HLD (hyperlipidemia) 07/10/2007  . MONOCLONAL GAMMOPATHY 07/10/2007  . ANEMIA-NOS 07/10/2007  . FATIGUE 07/10/2007    , W. 11/12/2018, 11:10 AM  Frazier Butt., PT   [facility] [address] [place], [name], [address] Phone: [phone]   Fax:  [phone]  Name: [name] MRN: [id number] Date of Birth: 07/29/1940

## 2018-11-16 ENCOUNTER — Other Ambulatory Visit: Payer: Self-pay

## 2018-11-16 ENCOUNTER — Ambulatory Visit: Payer: Medicare Other | Admitting: Physical Therapy

## 2018-11-16 ENCOUNTER — Encounter: Payer: Self-pay | Admitting: Physical Therapy

## 2018-11-16 DIAGNOSIS — R2681 Unsteadiness on feet: Secondary | ICD-10-CM | POA: Diagnosis not present

## 2018-11-16 DIAGNOSIS — R2689 Other abnormalities of gait and mobility: Secondary | ICD-10-CM | POA: Diagnosis not present

## 2018-11-16 DIAGNOSIS — R293 Abnormal posture: Secondary | ICD-10-CM

## 2018-11-16 DIAGNOSIS — R29818 Other symptoms and signs involving the nervous system: Secondary | ICD-10-CM | POA: Diagnosis not present

## 2018-11-16 NOTE — Patient Instructions (Signed)
Access Code: [email or web address]  URL: [email or web address]  Date: 11/16/2018  Prepared by: Mady Haagensen   Exercises Supine Bridge - 10 reps - 1 sets - 1x daily - 5x weekly Hooklying Isometric Clamshell - 10 reps - 1 sets - 1x daily - 5x weekly

## 2018-11-16 NOTE — Therapy (Signed)
[facility] [address] [place], [name], [address] Phone: [phone]   Fax:  [phone]  Physical Therapy Treatment  Patient Details  Name: [name] MRN: [id number] Date of Birth: [birth year] Referring Provider (PT): Tat, Wells Guiles   Encounter Date: 11/16/2018  PT End of Session - 11/16/18 1427    Visit Number  4    Number of Visits  9    Date for PT Re-Evaluation  01/16/19    Authorization Type  Medicare-will need 10th visit progress note    PT Start Time  1316    PT Stop Time  1400    PT Time Calculation (min)  44 min    Activity Tolerance  Patient tolerated treatment well    Behavior During Therapy  [facility] for tasks assessed/performed       Past Medical History:  Diagnosis Date  . Allergy    SEASONAL  . Anemia    Anemia-NOS / PMH, Dr Julien Nordmann  . Arthritis   . Breast cancer ([place])    stage I right breast  . Cataract    BILATERAL  . Decreased vision    R eye  . Esophageal Stricture   . Hyperlipidemia   . Monoclonal gammopathy   . MVP (mitral valve prolapse)   . Osteoporosis   . Parkinson disease ([place])   . Skin cancer    basal cell L neck  . Vertical diplopia     Past Surgical History:  Procedure Laterality Date  . APPENDECTOMY  2002  . BONE BIOPSY  11/06/10  . BREAST LUMPECTOMY  02/2010   right breast lumpectomy and sentinel node biopsy  . CARDIAC CATHETERIZATION  2005   neg  . CATARACT EXTRACTION, BILATERAL  2018  . COLONOSCOPY     negative   . HEMORRHOID SURGERY  1970's  . SALPINGOOPHORECTOMY  2002   for benign growths  . SHOULDER SURGERY Right   . TOTAL ABDOMINAL HYSTERECTOMY W/ BILATERAL SALPINGOOPHORECTOMY  78 yrs old   for pain,prolapse ; G3 P2  . UPPER GASTROINTESTINAL ENDOSCOPY      There were no vitals filed for this visit.  Subjective Assessment - 11/16/18 1319    Subjective  No more falls since last week.  Knee may be a little better; back still bothering me.  Did  contact the orthopedic MD-to see him early November.    Patient Stated Goals  Pt's goals for therapy are to help back and knee; to keep from falling    Currently in Pain?  Yes    Pain Score  8     Pain Location  Back    Pain Orientation  Lower;Right    Pain Descriptors / Indicators  Aching    Pain Type  Acute pain;Chronic pain    Pain Onset  More than a month ago    Aggravating Factors   sitting too long, moving certain positions    Pain Relieving Factors  rest    Pain Score  6    Pain Location  Knee    Pain Orientation  Right    Pain Descriptors / Indicators  Aching    Pain Type  Chronic pain    Pain Onset  More than a month ago   2-3 months   Pain Frequency  Intermittent    Aggravating Factors   turning    Pain Relieving Factors  rest  [facility] Adult PT Treatment/Exercise - 11/16/18 0001      Ambulation/Gait   Ambulation/Gait  Yes    Ambulation/Gait Assistance  4: Min guard    Ambulation/Gait Assistance Details  Cues for widened BOS, cues for placing cane wore wide and to the side    Ambulation Distance (Feet)  60 Feet   115 x 2   Assistive device  Straight cane    Gait Pattern  Step-through pattern;Decreased step length - right;Decreased step length - left;Decreased trunk rotation;Narrow base of support;Scissoring    Ambulation Surface  Level;Indoor    Gait Comments  Gait with quick stop/starts, several steps of backward walking, then return to forward walking (3 sets), with min guard assistance, cues to widen BOS.      Lumbar Exercises: Supine   Pelvic Tilt  2 reps;5 reps    Bridge  10 reps    Bridge Limitations  cues for slowed pace, control; no c/o back pain    Other Supine Lumbar Exercises  Hooklying position with resisted hip abduction clamshells green theraband x 10 reps      Knee/Hip Exercises: Standing   Terminal Knee Extension  Strengthening;Both;1 set;10 reps   Cues for glut/quad activation; pt has knees flexed in stand    Other Standing Knee Exercises  Upon standing, glut activation/glut squeezes x 5 reps; lateral weightshifting with widened BOS, x 10, then with resistance at hips x 10 reps; resistance given at hips in anterior/posterior position x 10 reps      Knee/Hip Exercises: Seated   Other Seated Knee/Hip Exercises       Sit to Sand  2 sets;5 reps   band around knees; cues for glut activation         Balance Exercises - 11/16/18 1345      Balance Exercises: Standing   Wall Bumps  Hip    Wall Bumps-Hips  Eyes opened;10 reps;Anterior/posterior    Stepping Strategy  Posterior;10 reps;UE support   2 sets; 2nd set alternating legs   Retro Gait  Upper extremity support;5 reps   at counter, fwd/back walking, then varied # of steps, min A   Heel Raises Limitations  x 10    Toe Raise Limitations  x 10    Other Standing Exercises           PT Education - 11/16/18 1427    Education Details  Added to HEP for hip strengthening-see instructions (pt has band at home)    Person(s) Educated  Patient    Methods  Explanation;Demonstration;Handout    Comprehension  Verbalized understanding;Returned demonstration;Verbal cues required;Need further instruction       PT Short Term Goals - 10/18/18 2145      PT SHORT TERM GOAL #1   Title  Pt will be independent with HEP for improved balance, strength, gait for improved functional mobility.  TARGET  for all STGs: 11/17/2018    Time  4    Period  Weeks    Status  New      PT SHORT TERM GOAL #2   Title  Pt will perform sit<>stand modified independently with minimal UE use and no posterior LOB, 4 of 5 trials, for improved safety and efficiency with transfers.    Time  4    Period  Weeks    Status  New      PT SHORT TERM GOAL #3   Title  Pt will improve TUG score to less than or equal to 15 seconds for decreased  fall risk.    Time  4    Period  Weeks    Status  New      PT SHORT TERM GOAL #4   Title  Pt will negotiate at least 4 steps with handrails,  modified independently, including standing safely at top of steps (to simulate opening door without LOB) .    Time  4    Period  Weeks    Status  New        PT Long Term Goals - 10/18/18 2150      PT LONG TERM GOAL #1   Title  Pt will verbalize plans for ongoing community fitness upon d/c from PT.  TARGET 12/15/2018 (for all LTGs)    Time  8    Period  Weeks    Status  New      PT LONG TERM GOAL #2   Title  Pt will improve gait velocity score to at least 2 ft/sec with appropriate assistive device, for decreased fall risk.    Time  8    Period  Weeks    Status  New      PT LONG TERM GOAL #3   Title  Pt will improve TUG score to less than or equal to 13.5 seconds for decreased fall risk.    Time  8    Period  Weeks    Status  New      PT LONG TERM GOAL #4   Title  Pt will demo <2 steps to recover balance in posterior push and release test for improved balance recovery in posterior direction.    Time  8    Period  Weeks    Status  New      PT LONG TERM GOAL #5   Title  Pt will verbalize understanding of fall preveniton in home environment.    Time  8    Period  Weeks    Status  New            Plan - 11/16/18 1428    Clinical Impression Statement  Focused today's PT session on hip strengthening and balance exercises, especially in posterior direction.  Pt reports and demonstrates overall decreased awareness of body/position sense with standing and gait (crossing feet, unaware of shoulders posterior to hips upon standing).  Will continue to benefit from skilled PT to address strength, balance, and gait for overall improved safety with funcitonal moiblity.    Personal Factors and Comorbidities  Comorbidity 3+    Comorbidities  Hx of breast cancer, arthritis, osteoporosis    Examination-Activity Limitations  Stairs;Locomotion Level;Transfers    Examination-Participation Restrictions  Community Activity   Exercise   Stability/Clinical Decision Making  Evolving/Moderate  complexity    Rehab Potential  Good    PT Frequency  1x / week    PT Duration  8 weeks   plus eval   PT Treatment/Interventions  ADLs/Self Care Home Management;Gait training;Functional mobility training;Therapeutic activities;Therapeutic exercise;Balance training;DME Instruction;Patient/family education;Neuromuscular re-education    PT Next Visit Plan  Check STGs; Review updates to HEP; functional lower extremity strength as part of HEP; hip, ankle strategy, stagger stance and lateral weightshifting; gait training with starts/stops/turns    PT Home Exercise Plan  Access Code: [facility]    Consulted and Agree with Plan of Care  Patient       Patient will benefit from skilled therapeutic intervention in order to improve the following deficits and impairments:  Abnormal gait, Decreased balance, Decreased mobility,  Decreased strength, Difficulty walking, Postural dysfunction  Visit Diagnosis: Unsteadiness on feet  Other abnormalities of gait and mobility  Abnormal posture     Problem List Patient Active Problem List   Diagnosis Date Noted  . Livedo reticularis 09/28/2018  . Abdominal pain 02/01/2018  . Low back pain 12/29/2017  . Dysuria 12/29/2017  . Edema 11/13/2017  . Right arm pain 02/03/2017  . Recurrent falls 02/03/2017  . Advance care planning 01/23/2017  . Hematuria 07/20/2016  . Headache 05/11/2015  . Globus sensation 04/24/2015  . Foot pain 02/25/2015  . Shoulder pain 04/17/2014  . Parkinson's disease ([place]) 06/05/2013  . Medicare annual wellness visit, subsequent 05/02/2012  . Neck pain 05/02/2012  . Osteoporosis 03/08/2011  . Breast cancer ([place]) 11/19/2010  . GERD (gastroesophageal reflux disease) 08/01/2008  . DYSPHAGIA UNSPECIFIED 08/01/2008  . ORTHOSTATIC HYPOTENSION 06/28/2008  . HLD (hyperlipidemia) 07/10/2007  . MONOCLONAL GAMMOPATHY 07/10/2007  . ANEMIA-NOS 07/10/2007  . FATIGUE 07/10/2007    Megan Whack W. 11/16/2018, 2:31 PM Frazier Butt.,  PT  [facility] [address] [place], [name], [address] Phone: [phone]   Fax:  [phone]  Name: [name] MRN: [id number] Date of Birth: Sep 03, 1940

## 2018-11-21 ENCOUNTER — Other Ambulatory Visit: Payer: Self-pay

## 2018-11-21 ENCOUNTER — Ambulatory Visit: Payer: Medicare Other | Admitting: Physical Therapy

## 2018-11-21 DIAGNOSIS — R2689 Other abnormalities of gait and mobility: Secondary | ICD-10-CM

## 2018-11-21 DIAGNOSIS — R29818 Other symptoms and signs involving the nervous system: Secondary | ICD-10-CM | POA: Diagnosis not present

## 2018-11-21 DIAGNOSIS — R2681 Unsteadiness on feet: Secondary | ICD-10-CM

## 2018-11-21 DIAGNOSIS — R293 Abnormal posture: Secondary | ICD-10-CM

## 2018-11-21 NOTE — Therapy (Signed)
[facility] [address] [place], [name], [address] Phone: [phone]   Fax:  [phone]  Physical Therapy Treatment  Patient Details  Name: [name] MRN: [id number] Date of Birth: 11/24/1940 Referring Provider (PT): Tat, Wells Guiles   Encounter Date: 11/21/2018  PT End of Session - 11/21/18 1757    Visit Number  5    Number of Visits  9    Date for PT Re-Evaluation  01/16/19    Authorization Type  Medicare-will need 10th visit progress note    PT Start Time  1152    PT Stop Time  1234    PT Time Calculation (min)  42 min    Activity Tolerance  Patient tolerated treatment well    Behavior During Therapy  [facility] for tasks assessed/performed       Past Medical History:  Diagnosis Date  . Allergy    SEASONAL  . Anemia    Anemia-NOS / PMH, Dr Julien Nordmann  . Arthritis   . Breast cancer ([place])    stage I right breast  . Cataract    BILATERAL  . Decreased vision    R eye  . Esophageal Stricture   . Hyperlipidemia   . Monoclonal gammopathy   . MVP (mitral valve prolapse)   . Osteoporosis   . Parkinson disease ([place])   . Skin cancer    basal cell L neck  . Vertical diplopia     Past Surgical History:  Procedure Laterality Date  . APPENDECTOMY  2002  . BONE BIOPSY  11/06/10  . BREAST LUMPECTOMY  02/2010   right breast lumpectomy and sentinel node biopsy  . CARDIAC CATHETERIZATION  2005   neg  . CATARACT EXTRACTION, BILATERAL  2018  . COLONOSCOPY     negative   . HEMORRHOID SURGERY  1970's  . SALPINGOOPHORECTOMY  2002   for benign growths  . SHOULDER SURGERY Right   . TOTAL ABDOMINAL HYSTERECTOMY W/ BILATERAL SALPINGOOPHORECTOMY  78 yrs old   for pain,prolapse ; G3 P2  . UPPER GASTROINTESTINAL ENDOSCOPY      There were no vitals filed for this visit.  Subjective Assessment - 11/21/18 1153    Subjective  No changes, no falls over the weekend.  Back is bothering me today, but that's not unsual.   It's far for me to come, so I'd like to finish next week.    Patient Stated Goals  Pt's goals for therapy are to help back and knee; to keep from falling    Currently in Pain?  Yes    Pain Score  5     Pain Location  Back    Pain Orientation  Right;Lower    Pain Descriptors / Indicators  Aching    Pain Type  Acute pain;Chronic pain    Pain Onset  More than a month ago    Pain Frequency  Intermittent    Aggravating Factors   sitting too long, certain positions    Pain Relieving Factors  rest    Pain Onset  More than a month ago   2-3 months                      [facility] Adult PT Treatment/Exercise - 11/21/18 1158      Transfers   Transfers  Sit to Stand;Stand to Sit    Sit to Stand  5: Supervision;Without upper extremity assist;From bed;From chair/3-in-1    Stand to Sit  5: Supervision;With  upper extremity assist;To bed;To chair/3-in-1    Number of Reps  2 sets;Other reps (comment)   5 reps, from mat, from chair   Comments  With initial attempts at sit to stand from chair height, pt has significant posterior lean, sitting back into chair. With reminder cues, pt is able to perform sit<>stand with better technique.      Ambulation/Gait   Ambulation/Gait  Yes    Ambulation/Gait Assistance  5: Supervision;4: Min guard    Ambulation Distance (Feet)  120 Feet    Assistive device  Straight cane    Gait Pattern  Step-through pattern;Decreased step length - right;Decreased step length - left;Decreased trunk rotation;Narrow base of support;Scissoring    Ambulation Surface  Level;Indoor    Gait velocity  14 sec = 2.34 ft/sec; 2nd trial:  12.79 sec = 2.56 ft/sec    Stairs  Yes    Stairs Assistance  6: Modified independent (Device/Increase time)    Stair Management Technique  Two rails;Alternating pattern;Forwards    Number of Stairs  --   x 3   Height of Stairs  6    Door Management  5: Supervision;Other (comment)    Door Managment Details (indicate cue type and reason)   Simulated opening door at top of her stairs into home, with cues provided for widened BOS for lateral/diagonal weigthshifting for better balance to open the door (as pt has fallen posteriorly down the steps before)      Standardized Balance Assessment   Standardized Balance Assessment  Timed Up and Go Test      Timed Up and Go Test   TUG  Normal TUG;Cognitive TUG    Normal TUG (seconds)  12.84    Cognitive TUG (seconds)  13.56      Neuro Re-ed    Neuro Re-ed Details   Worked on regaining balance with posterior balance perturbations, including posterior step and weigthshift x 10 reps with UE support, then posterior step (hinge at hips), then second step back to widen BOS and PWR! Up.  Repeated at lest 5 reps, with pt improving by end of reps.  Also practiced lateral weightshifting, x 10 reps      Pt performs supine pelvic tilts and reviews bridging x 10 reps, hooklying clamshell hip abduction x 10 reps (no band, as pt has theraband at home)       PT Education - 11/21/18 1756    Education Details  Additions to HEP-see instructions    Person(s) Educated  Patient    Methods  Explanation;Demonstration;Handout    Comprehension  Verbalized understanding;Returned demonstration;Verbal cues required       PT Short Term Goals - 11/21/18 1157      PT SHORT TERM GOAL #1   Title  Pt will be independent with HEP for improved balance, strength, gait for improved functional mobility.  TARGET  for all STGs: 11/17/2018    Time  4    Period  Weeks    Status  Achieved      PT SHORT TERM GOAL #2   Title  Pt will perform sit<>stand modified independently with minimal UE use and no posterior LOB, 4 of 5 trials, for improved safety and efficiency with transfers.    Time  4    Period  Weeks    Status  Partially Met      PT SHORT TERM GOAL #3   Title  Pt will improve TUG score to less than or equal to 15 seconds for decreased fall  risk.    Time  4    Period  Weeks    Status  Achieved      PT  SHORT TERM GOAL #4   Title  Pt will negotiate at least 4 steps with handrails, modified independently, including standing safely at top of steps (to simulate opening door without LOB) .    Time  4    Period  Weeks    Status  Achieved        PT Long Term Goals - 10/18/18 2150      PT LONG TERM GOAL #1   Title  Pt will verbalize plans for ongoing community fitness upon d/c from PT.  TARGET 12/15/2018 (for all LTGs)    Time  8    Period  Weeks    Status  New      PT LONG TERM GOAL #2   Title  Pt will improve gait velocity score to at least 2 ft/sec with appropriate assistive device, for decreased fall risk.    Time  8    Period  Weeks    Status  New      PT LONG TERM GOAL #3   Title  Pt will improve TUG score to less than or equal to 13.5 seconds for decreased fall risk.    Time  8    Period  Weeks    Status  New      PT LONG TERM GOAL #4   Title  Pt will demo <2 steps to recover balance in posterior push and release test for improved balance recovery in posterior direction.    Time  8    Period  Weeks    Status  New      PT LONG TERM GOAL #5   Title  Pt will verbalize understanding of fall preveniton in home environment.    Time  8    Period  Weeks    Status  New            Plan - 11/21/18 1758    Clinical Impression Statement  Assessed STGs this week, with pt meeting 3 of 4 STGs.  STG 2 partially met, as she performed sit<>stand safely from mat height, but has posterior lean from chair height.  Also addressed today weightshfiting and step strategies to lessen posterior lean and LOB.  Pt will continue to benefit from skilled PT to address balance and gait for overall improved funcitona lmobility.    Personal Factors and Comorbidities  Comorbidity 3+    Comorbidities  Hx of breast cancer, arthritis, osteoporosis    Examination-Activity Limitations  Stairs;Locomotion Level;Transfers    Examination-Participation Restrictions  Community Activity   Exercise    Stability/Clinical Decision Making  Evolving/Moderate complexity    Rehab Potential  Good    PT Frequency  2x / week   Recert 65/99/35 to capture 2 visits this week, in case pt decides to D/c   PT Duration  8 weeks   plus eval   PT Treatment/Interventions  ADLs/Self Care Home Management;Gait training;Functional mobility training;Therapeutic activities;Therapeutic exercise;Balance training;DME Instruction;Patient/family education;Neuromuscular re-education    PT Next Visit Plan  Review updates to HEP; functional lower extremity strength as part of HEP; hip, ankle strategy, stagger stance and lateral weightshifting; gait training with starts/stops/turns; pt may want to d/c next week; prepare for d/c, work towards [place].    PT Home Exercise Plan  Access Code: [email or web address]    Consulted and Agree with Plan of Care  Patient  Patient will benefit from skilled therapeutic intervention in order to improve the following deficits and impairments:  Abnormal gait, Decreased balance, Decreased mobility, Decreased strength, Difficulty walking, Postural dysfunction  Visit Diagnosis: Unsteadiness on feet  Other abnormalities of gait and mobility  Abnormal posture     Problem List Patient Active Problem List   Diagnosis Date Noted  . Livedo reticularis 09/28/2018  . Abdominal pain 02/01/2018  . Low back pain 12/29/2017  . Dysuria 12/29/2017  . Edema 11/13/2017  . Right arm pain 02/03/2017  . Recurrent falls 02/03/2017  . Advance care planning 01/23/2017  . Hematuria 07/20/2016  . Headache 05/11/2015  . Globus sensation 04/24/2015  . Foot pain 02/25/2015  . Shoulder pain 04/17/2014  . Parkinson's disease ([place]) 06/05/2013  . Medicare annual wellness visit, subsequent 05/02/2012  . Neck pain 05/02/2012  . Osteoporosis 03/08/2011  . Breast cancer ([place]) 11/19/2010  . GERD (gastroesophageal reflux disease) 08/01/2008  . DYSPHAGIA UNSPECIFIED 08/01/2008  . ORTHOSTATIC HYPOTENSION 06/28/2008   . HLD (hyperlipidemia) 07/10/2007  . MONOCLONAL GAMMOPATHY 07/10/2007  . ANEMIA-NOS 07/10/2007  . FATIGUE 07/10/2007    Eldon Zietlow W. 11/21/2018, 6:02 PM  Frazier Butt., PT   [facility] [address] [place], [name], [address] Phone: [phone]   Fax:  [phone]  Name: LATALIA ETZLER MRN: [id number] Date of Birth: 1940/11/29

## 2018-11-21 NOTE — Patient Instructions (Addendum)
Access Code: [email or web address]  URL: [email or web address]  Date: 11/21/2018  Prepared by: Mady Haagensen   Exercises Supine Bridge - 10 reps - 1 sets - 1x daily - 5x weekly Hooklying Isometric Clamshell - 10 reps - 1 sets - 1x daily - 5x weekly  Added 11/21/2018 Lateral Weight Shift - 10 reps - 1-2 sets - 1x daily - 7x weekly Retro Step - 10 reps - 3 sets - 1x daily - 7x weekly (take one step back, hinge at hip, second step back to widen BOS and PWR! Up)-to regain balance in posterior direction

## 2018-11-23 ENCOUNTER — Ambulatory Visit: Payer: Medicare Other | Admitting: Physical Therapy

## 2018-11-28 DIAGNOSIS — M25561 Pain in right knee: Secondary | ICD-10-CM | POA: Insufficient documentation

## 2018-11-30 ENCOUNTER — Ambulatory Visit: Payer: Medicare Other | Attending: Neurology | Admitting: Physical Therapy

## 2018-11-30 ENCOUNTER — Encounter: Payer: Self-pay | Admitting: Physical Therapy

## 2018-11-30 ENCOUNTER — Other Ambulatory Visit: Payer: Self-pay

## 2018-11-30 DIAGNOSIS — R2681 Unsteadiness on feet: Secondary | ICD-10-CM | POA: Diagnosis not present

## 2018-11-30 DIAGNOSIS — R2689 Other abnormalities of gait and mobility: Secondary | ICD-10-CM | POA: Diagnosis not present

## 2018-11-30 NOTE — Patient Instructions (Signed)

## 2018-12-01 NOTE — Therapy (Signed)
[facility] [address] [place], [name], [address] Phone: [phone]   Fax:  [phone]  Physical Therapy Treatment  Patient Details  Name: [name] MRN: [id number] Date of Birth: 05/03/1940 Referring Provider (PT): Tat, Wells Guiles   Encounter Date: 11/30/2018  PT End of Session - 12/01/18 1822    Visit Number  6    Number of Visits  9    Date for PT Re-Evaluation  01/16/19    Authorization Type  Medicare-will need 10th visit progress note    PT Start Time  1325    PT Stop Time  1400    PT Time Calculation (min)  35 min    Activity Tolerance  Patient tolerated treatment well    Behavior During Therapy  [facility] for tasks assessed/performed       Past Medical History:  Diagnosis Date  . Allergy    SEASONAL  . Anemia    Anemia-NOS / PMH, Dr Julien Nordmann  . Arthritis   . Breast cancer ([place])    stage I right breast  . Cataract    BILATERAL  . Decreased vision    R eye  . Esophageal Stricture   . Hyperlipidemia   . Monoclonal gammopathy   . MVP (mitral valve prolapse)   . Osteoporosis   . Parkinson disease ([place])   . Skin cancer    basal cell L neck  . Vertical diplopia     Past Surgical History:  Procedure Laterality Date  . APPENDECTOMY  2002  . BONE BIOPSY  11/06/10  . BREAST LUMPECTOMY  02/2010   right breast lumpectomy and sentinel node biopsy  . CARDIAC CATHETERIZATION  2005   neg  . CATARACT EXTRACTION, BILATERAL  2018  . COLONOSCOPY     negative   . HEMORRHOID SURGERY  1970's  . SALPINGOOPHORECTOMY  2002   for benign growths  . SHOULDER SURGERY Right   . TOTAL ABDOMINAL HYSTERECTOMY W/ BILATERAL SALPINGOOPHORECTOMY  78 yrs old   for pain,prolapse ; G3 P2  . UPPER GASTROINTESTINAL ENDOSCOPY      There were no vitals filed for this visit.  Subjective Assessment - 11/30/18 1326    Subjective  Fell over at [employer], trying to turn to get to the toilet.  That toilet is lower than mine  and has no rails.    Patient Stated Goals  Pt's goals for therapy are to help back and knee; to keep from falling    Currently in Pain?  Yes    Pain Score  3     Pain Location  Back    Pain Orientation  Right;Lower    Pain Descriptors / Indicators  Aching    Pain Type  Acute pain;Chronic pain    Pain Onset  More than a month ago    Pain Frequency  Intermittent    Aggravating Factors   sitting too long, certain positions    Pain Relieving Factors  rest    Pain Onset  More than a month ago   2-3 months                      [facility] Adult PT Treatment/Exercise - 12/01/18 0001      Transfers   Transfers  Sit to Stand;Stand to Sit    Sit to Stand  5: Supervision;Without upper extremity assist;From bed;From chair/3-in-1    Stand to Sit  5: Supervision;With upper extremity assist;To bed;To chair/3-in-1  Number of Reps  Other reps (comment)   5 reps   Comments  At slowed pace, pt performs without posterior lean.      Ambulation/Gait   Ambulation/Gait  Yes    Ambulation/Gait Assistance  5: Supervision;4: Min guard    Ambulation/Gait Assistance Details  Pt forgot to bring in cane today.  Discussed importance of use of proper assistive device.  Trialed rollator at end of session as a reminder for improved safety with rollator walker.    Ambulation Distance (Feet)  100 Feet   120, 40 ft x 2 no device   Assistive device  None   used clinic rollator at end of session   Gait Pattern  Step-through pattern;Decreased step length - right;Decreased step length - left;Decreased trunk rotation;Narrow base of support;Scissoring    Ambulation Surface  Level;Indoor    Gait velocity  12.72 sec (2.58 ft/sec); 11.94 sec (2.75 ft/sec)      Timed Up and Go Test   TUG  Normal TUG;Cognitive TUG    Normal TUG (seconds)  12.1    Cognitive TUG (seconds)  13.04      Self-Care   Self-Care  Other Self-Care Comments    Other Self-Care Comments   Discussec fall prevention in home envrionment  (including her friend, Tom's home, where she has had several falls).  Discussed SLOWED PACE, WIDENED BOS, and USE OF ROLLATOR to decrease fall risk.  Discussed continued fitness plans, with pt continueing to walk with friend at mall daily.  Provided pt with information on Fitness Fridays through [employer].  Discussed return visit in 6-9 months and pt prefers to wait on scheduling and f/u with MD.      Neuro Re-ed    Neuro Re-ed Details   REviewed HEP from last visit:  wide BOS lateral weigthshifting, then posterior step and regain balance iwth PWR! Up position, x 5 reps each leg.  Performed posterior push and release test x 3 reps, posterior pull test x 2 reps and PT gave posterior balance perturbations at hips, shoulders, (totatling at least 10 reps); pt able to regain balance in 2 steps or less, except one episode, with uncontrolled posterior stepping and PT assist.             PT Education - 12/01/18 1821    Education Details  Fall prevention, informaiton on [employer] Fridays; plan for d/c this visit    Person(s) Educated  Patient    Methods  Explanation;Handout    Comprehension  Verbalized understanding;Returned demonstration       PT Short Term Goals - 11/21/18 1157      PT SHORT TERM GOAL #1   Title  Pt will be independent with HEP for improved balance, strength, gait for improved functional mobility.  TARGET  for all STGs: 11/17/2018    Time  4    Period  Weeks    Status  Achieved      PT SHORT TERM GOAL #2   Title  Pt will perform sit<>stand modified independently with minimal UE use and no posterior LOB, 4 of 5 trials, for improved safety and efficiency with transfers.    Time  4    Period  Weeks    Status  Partially Met      PT SHORT TERM GOAL #3   Title  Pt will improve TUG score to less than or equal to 15 seconds for decreased fall risk.    Time  4    Period  Weeks    Status  Achieved      PT SHORT TERM GOAL #4   Title  Pt will  negotiate at least 4 steps with handrails, modified independently, including standing safely at top of steps (to simulate opening door without LOB) .    Time  4    Period  Weeks    Status  New        PT Long Term Goals - 12/01/18 1811      PT LONG TERM GOAL #1   Title  Pt will verbalize plans for ongoing community fitness upon d/c from PT.  TARGET 12/15/2018 (for all LTGs)    Time  8    Period  Weeks    Status  Achieved      PT LONG TERM GOAL #2   Title  Pt will improve gait velocity score to at least 2 ft/sec with appropriate assistive device, for decreased fall risk.    Time  8    Period  Weeks    Status  Achieved      PT LONG TERM GOAL #3   Title  Pt will improve TUG score to less than or equal to 13.5 seconds for decreased fall risk.    Baseline  12.1, TUG cog 13.04    Time  8    Period  Weeks    Status  Achieved      PT LONG TERM GOAL #4   Title  Pt will demo <2 steps to recover balance in posterior push and release test for improved balance recovery in posterior direction.    Time  8    Period  Weeks    Status  Achieved      PT LONG TERM GOAL #5   Title  Pt will verbalize understanding of fall preveniton in home environment.    Time  8    Period  Weeks    Status  Achieved            Plan - 12/01/18 1822    Clinical Impression Statement  Today was pt's last scheduled visit, and she feels she is ready for d/c.  Despite pt's continued falls (about 1x/wk), she has met all LTGs checked today.  In clinic with balance perturbations, she has improved in posterior direction balance recovery.  Education provided on fall prevention and use of appropriate device (rollator), though pt does not usually use this.  She is appropriate for d/c at this time.    Personal Factors and Comorbidities  Comorbidity 3+    Comorbidities  Hx of breast cancer, arthritis, osteoporosis    Examination-Activity Limitations  Stairs;Locomotion Level;Transfers    Examination-Participation  Restrictions  Community Activity   Exercise   Stability/Clinical Decision Making  Evolving/Moderate complexity    Rehab Potential  Good    PT Frequency  2x / week   Recert 09/60/45 to capture 2 visits this week, in case pt decides to D/c   PT Duration  8 weeks   plus eval   PT Treatment/Interventions  ADLs/Self Care Home Management;Gait training;Functional mobility training;Therapeutic activities;Therapeutic exercise;Balance training;DME Instruction;Patient/family education;Neuromuscular re-education    PT Next Visit Plan  D/C this visit; PT would recommend return PT eval in 6-9 months; pt wants to wait on scheduling and discuss with MD when future PT needed.    PT Home Exercise Plan  Access Code: [email or web address]    Consulted and Agree with Plan of Care  Patient       Patient will  benefit from skilled therapeutic intervention in order to improve the following deficits and impairments:  Abnormal gait, Decreased balance, Decreased mobility, Decreased strength, Difficulty walking, Postural dysfunction  Visit Diagnosis: Unsteadiness on feet  Other abnormalities of gait and mobility     Problem List Patient Active Problem List   Diagnosis Date Noted  . Livedo reticularis 09/28/2018  . Abdominal pain 02/01/2018  . Low back pain 12/29/2017  . Dysuria 12/29/2017  . Edema 11/13/2017  . Right arm pain 02/03/2017  . Recurrent falls 02/03/2017  . Advance care planning 01/23/2017  . Hematuria 07/20/2016  . Headache 05/11/2015  . Globus sensation 04/24/2015  . Foot pain 02/25/2015  . Shoulder pain 04/17/2014  . Parkinson's disease ([place]) 06/05/2013  . Medicare annual wellness visit, subsequent 05/02/2012  . Neck pain 05/02/2012  . Osteoporosis 03/08/2011  . Breast cancer ([place]) 11/19/2010  . GERD (gastroesophageal reflux disease) 08/01/2008  . DYSPHAGIA UNSPECIFIED 08/01/2008  . ORTHOSTATIC HYPOTENSION 06/28/2008  . HLD (hyperlipidemia) 07/10/2007  . MONOCLONAL GAMMOPATHY 07/10/2007  .  ANEMIA-NOS 07/10/2007  . FATIGUE 07/10/2007    Christphor Groft W. 12/01/2018, 6:25 PM Frazier Butt., PT  [facility] [address] [place], [name], [address] Phone: [phone]   Fax:  [phone]  Name: [name] MRN: [id number] Date of Birth: 1940-05-24   PHYSICAL THERAPY DISCHARGE SUMMARY  Visits from Start of Care: 6  Current functional level related to goals / functional outcomes: See STGs and LTGs above   Remaining deficits: Falls-pt has demonstrated improved ability in clinic for posterior balance recovery, but she is still reporting falls at home.   Education / Equipment: Educated in fall prevention, HEP, continued fitness options.  Plan: Patient agrees to discharge.  Patient goals were met. Patient is being discharged due to meeting the stated rehab goals.  ?????REcommend return PT eval in 6-9 months, but pt wants to wait to f/u with MD.    Mady Haagensen, PT 12/01/18 6:27 PM Phone: [phone] Fax: [phone]

## 2018-12-06 DIAGNOSIS — M1711 Unilateral primary osteoarthritis, right knee: Secondary | ICD-10-CM | POA: Diagnosis not present

## 2018-12-06 DIAGNOSIS — M25561 Pain in right knee: Secondary | ICD-10-CM | POA: Diagnosis not present

## 2018-12-14 DIAGNOSIS — I341 Nonrheumatic mitral (valve) prolapse: Secondary | ICD-10-CM | POA: Insufficient documentation

## 2018-12-15 ENCOUNTER — Other Ambulatory Visit: Payer: Self-pay

## 2018-12-15 ENCOUNTER — Ambulatory Visit (INDEPENDENT_AMBULATORY_CARE_PROVIDER_SITE_OTHER): Payer: Medicare Other | Admitting: Neurology

## 2018-12-15 ENCOUNTER — Other Ambulatory Visit: Payer: Self-pay | Admitting: Internal Medicine

## 2018-12-15 DIAGNOSIS — K117 Disturbances of salivary secretion: Secondary | ICD-10-CM | POA: Diagnosis not present

## 2018-12-15 DIAGNOSIS — C50919 Malignant neoplasm of unspecified site of unspecified female breast: Secondary | ICD-10-CM

## 2018-12-15 NOTE — Procedures (Signed)
Botulinum Clinic   History:  Diagnosis: Sialorrhea associated with PD (icd10: K11.7)    Informed consent was obtained.  Discussed differences between myobloc and xeomin.  The patient was educated on the botulinum toxin the black blox warning and given a copy of the botox patient medication guide.  The patient understands that this warning states that there have been reported cases of the Botox extending beyond the injection site and creating adverse effects, similar to those of botulism. This included loss of strength, trouble walking, hoarseness, trouble saying words clearly, loss of bladder control, trouble breathing, trouble swallowing, diplopia, blurry vision and ptosis. Most of the distant spread of Botox was happening in patients, primarily children, who received medication for spasticity or for cervical dystonia. The patient expressed understanding and desire to proceed.   Injections  Location Left  Right Units Number of sites  Parotid (point 1 on picture) 30 30 60 1 per side  Submandibular (point 2) 20 20 40 1 per side  TOTAL UNITS:   100    Type of Toxin: Xeomin Discarded Units: 0  Needle drawback with each injection was free of blood. Pt tolerated procedure well without complications.   Reinjection is anticipated in 4 months.

## 2018-12-18 ENCOUNTER — Other Ambulatory Visit: Payer: Self-pay | Admitting: Neurology

## 2018-12-25 ENCOUNTER — Other Ambulatory Visit: Payer: Self-pay | Admitting: Neurology

## 2019-01-21 ENCOUNTER — Other Ambulatory Visit: Payer: Self-pay | Admitting: Family Medicine

## 2019-01-21 DIAGNOSIS — E782 Mixed hyperlipidemia: Secondary | ICD-10-CM

## 2019-01-21 DIAGNOSIS — R739 Hyperglycemia, unspecified: Secondary | ICD-10-CM

## 2019-01-21 DIAGNOSIS — M810 Age-related osteoporosis without current pathological fracture: Secondary | ICD-10-CM

## 2019-01-21 DIAGNOSIS — R7989 Other specified abnormal findings of blood chemistry: Secondary | ICD-10-CM

## 2019-01-29 ENCOUNTER — Other Ambulatory Visit: Payer: Self-pay

## 2019-01-29 ENCOUNTER — Other Ambulatory Visit (INDEPENDENT_AMBULATORY_CARE_PROVIDER_SITE_OTHER): Payer: Medicare Other

## 2019-01-29 ENCOUNTER — Ambulatory Visit (INDEPENDENT_AMBULATORY_CARE_PROVIDER_SITE_OTHER): Payer: Medicare Other

## 2019-01-29 DIAGNOSIS — Z0000 Encounter for general adult medical examination without abnormal findings: Secondary | ICD-10-CM

## 2019-01-29 DIAGNOSIS — M810 Age-related osteoporosis without current pathological fracture: Secondary | ICD-10-CM

## 2019-01-29 DIAGNOSIS — R7989 Other specified abnormal findings of blood chemistry: Secondary | ICD-10-CM

## 2019-01-29 DIAGNOSIS — R739 Hyperglycemia, unspecified: Secondary | ICD-10-CM

## 2019-01-29 LAB — LIPID PANEL
Cholesterol: 165 mg/dL (ref 0–200)
HDL: 74.1 mg/dL (ref >39.00)
LDL Cholesterol: 78 mg/dL (ref 0–99)
NonHDL: 91.26
Total CHOL/HDL Ratio: 2
Triglycerides: 68 mg/dL (ref 0.0–149.0)
VLDL: 13.6 mg/dL (ref 0.0–40.0)

## 2019-01-29 LAB — BASIC METABOLIC PANEL
BUN: 17 mg/dL (ref 6–23)
CO2: 27 mEq/L (ref 19–32)
Calcium: 9.2 mg/dL (ref 8.4–10.5)
Chloride: 104 mEq/L (ref 96–112)
Creatinine, Ser: 0.75 mg/dL (ref 0.40–1.20)
GFR: 74.86 mL/min (ref >60.00)
Glucose, Bld: 104 mg/dL — ABNORMAL HIGH (ref 70–99)
Potassium: 3.9 mEq/L (ref 3.5–5.1)
Sodium: 142 mEq/L (ref 135–145)

## 2019-01-29 LAB — HEMOGLOBIN A1C: Hgb A1c MFr Bld: 5.8 % (ref 4.6–6.5)

## 2019-01-29 LAB — TSH: TSH: 3.44 u[IU]/mL (ref 0.35–4.50)

## 2019-01-29 LAB — VITAMIN D 25 HYDROXY (VIT D DEFICIENCY, FRACTURES): VITD: 39.88 ng/mL (ref 30.00–100.00)

## 2019-01-29 NOTE — Patient Instructions (Signed)
[name] , Thank you for taking time to come for your Medicare Wellness Visit. I appreciate your ongoing commitment to your health goals. Please review the following plan we discussed and let me know if I can assist you in the future.   Screening recommendations/referrals: Colonoscopy: Up to date, completed 11/03/2009 Mammogram: Up to date, completed 02/14/2018 Bone Density: Up to date, completed 04/20/2012 Recommended yearly ophthalmology/optometry visit for glaucoma screening and checkup Recommended yearly dental visit for hygiene and checkup  Vaccinations: Influenza vaccine: Up to date, completed 10/09/2018 Pneumococcal vaccine: Completed series Tdap vaccine: Up to date, completed 03/03/2009 Shingles vaccine: Completed series    Advanced directives: Please bring a copy of your POA (Power of [place]) and/or Living Will to your next appointment.   Conditions/risks identified: hyperlipidemia  Next appointment: 02/01/2019 @ 10:45 am    Preventive Care 65 Years and Older, Female Preventive care refers to lifestyle choices and visits with your health care provider that can promote health and wellness. What does preventive care include?  A yearly physical exam. This is also called an annual well check.  Dental exams once or twice a year.  Routine eye exams. Ask your health care provider how often you should have your eyes checked.  Personal lifestyle choices, including:  Daily care of your teeth and gums.  Regular physical activity.  Eating a healthy diet.  Avoiding tobacco and drug use.  Limiting alcohol use.  Practicing safe sex.  Taking low-dose aspirin every day.  Taking vitamin and mineral supplements as recommended by your health care provider. What happens during an annual well check? The services and screenings done by your health care provider during your annual well check will depend on your age, overall health, lifestyle risk factors, and family history of  disease. Counseling  Your health care provider may ask you questions about your:  Alcohol use.  Tobacco use.  Drug use.  Emotional well-being.  Home and relationship well-being.  Sexual activity.  Eating habits.  History of falls.  Memory and ability to understand (cognition).  Work and work Statistician.  Reproductive health. Screening  You may have the following tests or measurements:  Height, weight, and BMI.  Blood pressure.  Lipid and cholesterol levels. These may be checked every 5 years, or more frequently if you are over 19 years old.  Skin check.  Lung cancer screening. You may have this screening every year starting at age 78 if you have a 30-pack-year history of smoking and currently smoke or have quit within the past 15 years.  Fecal occult blood test (FOBT) of the stool. You may have this test every year starting at age 103.  Flexible sigmoidoscopy or colonoscopy. You may have a sigmoidoscopy every 5 years or a colonoscopy every 10 years starting at age 52.  Hepatitis C blood test.  Hepatitis B blood test.  Sexually transmitted disease (STD) testing.  Diabetes screening. This is done by checking your blood sugar (glucose) after you have not eaten for a while (fasting). You may have this done every 1-3 years.  Bone density scan. This is done to screen for osteoporosis. You may have this done starting at age 49.  Mammogram. This may be done every 1-2 years. Talk to your health care provider about how often you should have regular mammograms. Talk with your health care provider about your test results, treatment options, and if necessary, the need for more tests. Vaccines  Your health care provider may recommend certain vaccines, such as:  Influenza vaccine. This is recommended every year.  Tetanus, diphtheria, and acellular pertussis (Tdap, Td) vaccine. You may need a Td booster every 10 years.  Zoster vaccine. You may need this after age  7.  Pneumococcal 13-valent conjugate (PCV13) vaccine. One dose is recommended after age 54.  Pneumococcal polysaccharide (PPSV23) vaccine. One dose is recommended after age 41. Talk to your health care provider about which screenings and vaccines you need and how often you need them. This information is not intended to replace advice given to you by your health care provider. Make sure you discuss any questions you have with your health care provider. Document Released: 02/07/2015 Document Revised: 10/01/2015 Document Reviewed: 11/12/2014 Elsevier Interactive Patient Education  2017 [place] Prevention in the Home Falls can cause injuries. They can happen to people of all ages. There are many things you can do to make your home safe and to help prevent falls. What can I do on the outside of my home?  Regularly fix the edges of walkways and driveways and fix any cracks.  Remove anything that might make you trip as you walk through a door, such as a raised step or threshold.  Trim any bushes or trees on the path to your home.  Use bright outdoor lighting.  Clear any walking paths of anything that might make someone trip, such as rocks or tools.  Regularly check to see if handrails are loose or broken. Make sure that both sides of any steps have handrails.  Any raised decks and porches should have guardrails on the edges.  Have any leaves, snow, or ice cleared regularly.  Use sand or salt on walking paths during winter.  Clean up any spills in your garage right away. This includes oil or grease spills. What can I do in the bathroom?  Use night lights.  Install grab bars by the toilet and in the tub and shower. Do not use towel bars as grab bars.  Use non-skid mats or decals in the tub or shower.  If you need to sit down in the shower, use a plastic, non-slip stool.  Keep the floor dry. Clean up any water that spills on the floor as soon as it happens.  Remove  soap buildup in the tub or shower regularly.  Attach bath mats securely with double-sided non-slip rug tape.  Do not have throw rugs and other things on the floor that can make you trip. What can I do in the bedroom?  Use night lights.  Make sure that you have a light by your bed that is easy to reach.  Do not use any sheets or blankets that are too big for your bed. They should not hang down onto the floor.  Have a firm chair that has side arms. You can use this for support while you get dressed.  Do not have throw rugs and other things on the floor that can make you trip. What can I do in the kitchen?  Clean up any spills right away.  Avoid walking on wet floors.  Keep items that you use a lot in easy-to-reach places.  If you need to reach something above you, use a strong step stool that has a grab bar.  Keep electrical cords out of the way.  Do not use floor polish or wax that makes floors slippery. If you must use wax, use non-skid floor wax.  Do not have throw rugs and other things on the floor that  can make you trip. What can I do with my stairs?  Do not leave any items on the stairs.  Make sure that there are handrails on both sides of the stairs and use them. Fix handrails that are broken or loose. Make sure that handrails are as long as the stairways.  Check any carpeting to make sure that it is firmly attached to the stairs. Fix any carpet that is loose or worn.  Avoid having throw rugs at the top or bottom of the stairs. If you do have throw rugs, attach them to the floor with carpet tape.  Make sure that you have a light switch at the top of the stairs and the bottom of the stairs. If you do not have them, ask someone to add them for you. What else can I do to help prevent falls?  Wear shoes that:  Do not have high heels.  Have rubber bottoms.  Are comfortable and fit you well.  Are closed at the toe. Do not wear sandals.  If you use a  stepladder:  Make sure that it is fully opened. Do not climb a closed stepladder.  Make sure that both sides of the stepladder are locked into place.  Ask someone to hold it for you, if possible.  Clearly mark and make sure that you can see:  Any grab bars or handrails.  First and last steps.  Where the edge of each step is.  Use tools that help you move around (mobility aids) if they are needed. These include:  Canes.  Walkers.  Scooters.  Crutches.  Turn on the lights when you go into a dark area. Replace any light bulbs as soon as they burn out.  Set up your furniture so you have a clear path. Avoid moving your furniture around.  If any of your floors are uneven, fix them.  If there are any pets around you, be aware of where they are.  Review your medicines with your doctor. Some medicines can make you feel dizzy. This can increase your chance of falling. Ask your doctor what other things that you can do to help prevent falls. This information is not intended to replace advice given to you by your health care provider. Make sure you discuss any questions you have with your health care provider. Document Released: 11/07/2008 Document Revised: 06/19/2015 Document Reviewed: 02/15/2014 Elsevier Interactive Patient Education  2017 [employer].

## 2019-01-29 NOTE — Progress Notes (Signed)
PCP notes:  Health Maintenance: No gaps noted   Abnormal Screenings: none   Patient concerns: none   Nurse concerns: none   Next PCP appt.: 02/01/2019@ 10:45 am

## 2019-01-29 NOTE — Progress Notes (Signed)
Subjective:   [name] is a 79 y.o. female who presents for Medicare Annual (Subsequent) preventive examination.  Review of Systems: N/A    This visit is being conducted through telemedicine via telephone at the nurse health advisor's home address due to the COVID-19 pandemic. This patient has given me verbal consent via doximity to conduct this visit, patient states they are participating from their home address. Patient and myself are on the telephone call. There is no referral for this visit. Some vital signs may be absent or patient reported.    Patient identification: identified by name, DOB, and current address   Cardiac Risk Factors include: advanced age (>27men, >17 women);dyslipidemia     Objective:     Vitals: There were no vitals taken for this visit.  There is no height or weight on file to calculate BMI.  Advanced Directives 01/29/2019 10/18/2018 01/16/2018 09/23/2017 01/13/2017 10/18/2016 04/19/2016  Does Patient Have a Medical Advance Directive? Yes Yes Yes Yes Yes Yes Yes  Type of Paramedic of [place];Living will - [place];Living will [place];Living will [place];Living will [place];Living will [place];Living will  Does patient want to make changes to medical advance directive? - No - Patient declined - - Yes (MAU/Ambulatory/Procedural Areas - Information given) - -  Copy of [place] in Chart? No - copy requested - No - copy requested - No - copy requested Yes No - copy requested  Would patient like information on creating a medical advance directive? - - - - - - -    Tobacco Social History   Tobacco Use  Smoking Status Never Smoker  Smokeless Tobacco Never Used     Counseling given: Not Answered   Clinical Intake:  Pre-visit preparation completed: Yes  Pain : 0-10 Pain Score: 4  Pain Type: Chronic pain  Pain Location: Back Pain Orientation: Lower Pain Descriptors / Indicators: Aching Pain Onset: More than a month ago Pain Frequency: Intermittent     Nutritional Risks: None Diabetes: No  How often do you need to have someone help you when you read instructions, pamphlets, or other written materials from your doctor or pharmacy?: 1 - Never What is the last grade level you completed in school?: college  Interpreter Needed?: No  Information entered by :: CJohnson, LPN  Past Medical History:  Diagnosis Date  . Allergy    SEASONAL  . Anemia    Anemia-NOS / PMH, Dr Julien Nordmann  . Arthritis   . Breast cancer ([place])    stage I right breast  . Cataract    BILATERAL  . Decreased vision    R eye  . Esophageal Stricture   . Hyperlipidemia   . Monoclonal gammopathy   . MVP (mitral valve prolapse)   . Osteoporosis   . Parkinson disease ([place])   . Skin cancer    basal cell L neck  . Vertical diplopia    Past Surgical History:  Procedure Laterality Date  . APPENDECTOMY  2002  . BONE BIOPSY  11/06/10  . BREAST LUMPECTOMY  02/2010   right breast lumpectomy and sentinel node biopsy  . CARDIAC CATHETERIZATION  2005   neg  . CATARACT EXTRACTION, BILATERAL  2018  . COLONOSCOPY     negative   . HEMORRHOID SURGERY  1970's  . SALPINGOOPHORECTOMY  2002   for benign growths  . SHOULDER SURGERY Right   . TOTAL  ABDOMINAL HYSTERECTOMY W/ BILATERAL SALPINGOOPHORECTOMY  79 yrs old   for pain,prolapse ; G3 P2  . UPPER GASTROINTESTINAL ENDOSCOPY     Family History  Problem Relation Age of Onset  . Diabetes Mother   . Heart disease Mother   . Diabetes Brother   . Heart disease Brother   . Hepatitis Father   . Diabetes Brother   . Diabetes Brother   . Breast cancer Neg Hx   . Colon cancer Neg Hx    Social History   Socioeconomic History  . Marital status: Widowed    Spouse name: Not on file  . Number of children: 2  . Years of education: Not on file  . Highest education level:  Not on file  Occupational History  . Occupation: Retired  Tobacco Use  . Smoking status: Never Smoker  . Smokeless tobacco: Never Used  Substance and Sexual Activity  . Alcohol use: Not Currently    Alcohol/week: 1.0 standard drinks    Types: 1 Glasses of wine per week  . Drug use: No  . Sexual activity: Not Currently  Other Topics Concern  . Not on file  Social History Narrative   Occupation: Textile   Widowed 2016, husband had cancer, was married in 1959    Patient has never smoked.    Illicit Drug Use - no   Patient does not get regular exercise.    Daily Caffeine Use: 1 cup of coffee daily   Social Determinants of Health   Financial Resource Strain: Low Risk   . Difficulty of Paying Living Expenses: Not hard at all  Food Insecurity: No Food Insecurity  . Worried About Charity fundraiser in the Last Year: Never true  . Ran Out of Food in the Last Year: Never true  Transportation Needs: No Transportation Needs  . Lack of Transportation (Medical): No  . Lack of Transportation (Non-Medical): No  Physical Activity: Inactive  . Days of Exercise per Week: 0 days  . Minutes of Exercise per Session: 0 min  Stress: No Stress Concern Present  . Feeling of Stress : Not at all  Social Connections:   . Frequency of Communication with Friends and Family: Not on file  . Frequency of Social Gatherings with Friends and Family: Not on file  . Attends Religious Services: Not on file  . Active Member of Clubs or Organizations: Not on file  . Attends Archivist Meetings: Not on file  . Marital Status: Not on file    Outpatient Encounter Medications as of 01/29/2019  Medication Sig  . amantadine (SYMMETREL) 100 MG capsule Take 1 capsule (100 mg total) by mouth 3 (three) times daily.  [name] aspirin 81 MG tablet Take 81 mg by mouth daily.    [name] b complex vitamins tablet Take 1 tablet by mouth daily.  . Calcium 1500 MG tablet Take 1,500 mg by mouth.    . carbidopa-levodopa (SINEMET  IR) 25-100 MG tablet TAKE 1 & 1/2 (ONE & ONE-HALF) TABLETS BY MOUTH THREE TIMES DAILY  . Cholecalciferol 5000 units TABS Take 1 tablet (5,000 Units total) by mouth daily.  . clonazePAM (KLONOPIN) 0.5 MG tablet TAKE 1/2 (ONE-HALF) TABLET BY MOUTH AT BEDTIME  . Denosumab (PROLIA [place]) Inject into the skin every 6 (six) months.  . Elastic Bandages & Supports (ABDOMINAL BINDER/ELASTIC SMALL) MISC 1 Device by Does not apply route daily.  . Ferrous Sulfate (IRON) 325 (65 Fe) MG TABS Take 325 mg by mouth.  [name]  fluticasone (FLONASE) 50 MCG/ACT nasal spray Place 2 sprays into both nostrils daily.  . Multiple Vitamin (MULTIVITAMIN) capsule 2 tabs po qd   . nitroGLYCERIN (NITROSTAT) 0.3 MG SL tablet Place 1 tablet (0.3 mg total) under the tongue every 5 (five) minutes as needed (max 3 doses). Reported on 04/07/2015  . Omega-3 Fatty Acids (OMEGA 3 PO) 1 tab po qd   . omeprazole (PRILOSEC) 20 MG capsule Take 1 capsule by mouth once daily  . OnabotulinumtoxinA (BOTOX IM) Inject into the muscle every 3 (three) months. Injection every 3 months  . tamoxifen (NOLVADEX) 20 MG tablet Take 1 tablet by mouth once daily  . traMADol (ULTRAM) 50 MG tablet Take 1 tablet (50 mg total) by mouth daily as needed.  . venlafaxine XR (EFFEXOR-XR) 75 MG 24 hr capsule TAKE 1 CAPSULE BY MOUTH ONCE DAILY WITH BREAKFAST  . vitamin B-12 (CYANOCOBALAMIN) 500 MCG tablet Take 2500 mcg daily.   Facility-Administered Encounter Medications as of 01/29/2019  Medication  . incobotulinumtoxinA (XEOMIN) 100 units injection 100 Units  . incobotulinumtoxinA (XEOMIN) 100 units injection 100 Units    Activities of Daily Living In your present state of health, do you have any difficulty performing the following activities: 01/29/2019  Hearing? N  Vision? N  Difficulty concentrating or making decisions? N  Walking or climbing stairs? N  Dressing or bathing? N  Doing errands, shopping? N  Preparing Food and eating ? N  Using the Toilet? N  In the  past six months, have you accidently leaked urine? N  Do you have problems with loss of bowel control? N  Managing your Medications? N  Managing your Finances? N  Housekeeping or managing your Housekeeping? N  Some recent data might be hidden    Patient Care Team: Tonia Ghent, MD as PCP - General (Family Medicine)    Assessment:   This is a routine wellness examination for [place].  Exercise Activities and Dietary recommendations Current Exercise Habits: The patient does not participate in regular exercise at present, Exercise limited by: None identified  Goals    . Increase physical activity     Starting 01/16/2018 I will continue to exercise for 120 minutes 5 days per week.     . Patient Stated     01/29/2019, I will try to start back exercising after the pandemic is under control.        Fall Risk Fall Risk  01/29/2019 09/28/2018 01/16/2018 12/06/2017 07/19/2017  Falls in the past year? 1 1 1 1  Yes  Comment tripped and fell - several falls due to loss of balance - -  Number falls in past yr: 1 1 1 1 2  or more  Injury with Fall? 0 1 1 0 Yes  Comment - Posterior scalp- months ago. Left knee- about 4 months ago bruising - -  Risk Factor Category  - - - - -  Risk for fall due to : Medication side effect;Impaired balance/gait Impaired balance/gait - History of fall(s);Impaired balance/gait -  Follow up Falls evaluation completed;Falls prevention discussed Falls evaluation completed - Falls evaluation completed -   Is the patient's home free of loose throw rugs in walkways, pet beds, electrical cords, etc?   yes      Grab bars in the bathroom? yes      Handrails on the stairs?   yes      Adequate lighting?   yes  Timed Get Up and Go performed: N/A  Depression Screen PHQ 2/9 Scores  01/29/2019 01/16/2018 01/13/2017 10/17/2015  PHQ - 2 Score 0 0 0 0  PHQ- 9 Score 0 0 0 -     Cognitive Function MMSE - Mini Mental State Exam 01/29/2019 01/16/2018 01/13/2017  Orientation to time  5 5 5   Orientation to Place 5 5 5   Registration 3 3 3   Attention/ Calculation 5 0 0  Recall 3 3 3   Language- name 2 objects - 0 0  Language- repeat 1 1 1   Language- follow 3 step command - 3 3  Language- read & follow direction - 0 0  Write a sentence - 0 0  Copy design - 0 0  Total score - 20 20  Mini Cog  Mini-Cog screen was completed. Maximum score is 22. A value of 0 denotes this part of the MMSE was not completed or the patient failed this part of the Mini-Cog screening.       Immunization History  Administered Date(s) Administered  . Fluad Quad(high Dose 65+) 10/09/2018  . Influenza Split 01/06/2011, 12/01/2011  . Influenza Whole 02/21/2009  . Influenza,inj,Quad PF,6+ Mos 10/31/2012, 10/08/2013, 10/08/2014, 09/26/2015, 10/06/2016, 10/03/2017  . Influenza,inj,quad, With Preservative 09/27/2016, 10/09/2018  . Pneumococcal Conjugate-13 07/18/2014  . Pneumococcal Polysaccharide-23 03/04/2011  . Zoster 10/26/2012  . Zoster Recombinat (Shingrix) 10/08/2017, 12/07/2017    Qualifies for Shingles Vaccine?Completed series   Screening Tests Health Maintenance  Topic Date Due  . TETANUS/TDAP  03/04/2019  . INFLUENZA VACCINE  Completed  . DEXA SCAN  Completed  . PNA vac Low Risk Adult  Completed    Cancer Screenings: Lung: Low Dose CT Chest recommended if Age 46-80 years, 30 pack-year currently smoking OR have quit w/in 15years. Patient does not qualify. Breast:  Up to date on Mammogram? Yes, completed 02/14/2018   Up to date of Bone Density/Dexa? Yes, completed 04/20/2012 Colorectal: completed 11/03/2009  Additional Screenings:  Hepatitis C Screening: N/A     Plan:    Patient will start back exercising once pandemic gets under control.    I have personally reviewed and noted the following in the patient's chart:   . Medical and social history . Use of alcohol, tobacco or illicit drugs  . Current medications and supplements . Functional ability and status .  Nutritional status . Physical activity . Advanced directives . List of other physicians . Hospitalizations, surgeries, and ER visits in previous 12 months . Vitals . Screenings to include cognitive, depression, and falls . Referrals and appointments  In addition, I have reviewed and discussed with patient certain preventive protocols, quality metrics, and best practice recommendations. A written personalized care plan for preventive services as well as general preventive health recommendations were provided to patient.     Andrez Grime, LPN  [id number]

## 2019-01-30 ENCOUNTER — Other Ambulatory Visit: Payer: Self-pay

## 2019-01-30 ENCOUNTER — Other Ambulatory Visit: Payer: Self-pay | Admitting: *Deleted

## 2019-01-30 DIAGNOSIS — C50919 Malignant neoplasm of unspecified site of unspecified female breast: Secondary | ICD-10-CM

## 2019-01-30 MED ORDER — TAMOXIFEN CITRATE 20 MG PO TABS: 20.0000 mg | ORAL_TABLET | Freq: Every day | ORAL | 3 refills | Status: DC

## 2019-01-31 MED ORDER — VENLAFAXINE HCL ER 75 MG PO CP24: ORAL_CAPSULE | ORAL | 1 refills | Status: DC

## 2019-01-31 MED ORDER — CARBIDOPA-LEVODOPA 25-100 MG PO TABS: ORAL_TABLET | ORAL | 1 refills | Status: DC

## 2019-01-31 MED ORDER — CLONAZEPAM 0.5 MG PO TABS: ORAL_TABLET | ORAL | 1 refills | Status: DC

## 2019-01-31 MED ORDER — AMANTADINE HCL 100 MG PO CAPS: 100.0000 mg | ORAL_CAPSULE | Freq: Three times a day (TID) | ORAL | 1 refills | Status: DC

## 2019-02-01 ENCOUNTER — Ambulatory Visit: Payer: Medicare Other

## 2019-02-01 ENCOUNTER — Encounter: Payer: Self-pay | Admitting: Family Medicine

## 2019-02-01 ENCOUNTER — Ambulatory Visit (INDEPENDENT_AMBULATORY_CARE_PROVIDER_SITE_OTHER): Payer: Medicare Other | Admitting: Family Medicine

## 2019-02-01 ENCOUNTER — Other Ambulatory Visit: Payer: Self-pay

## 2019-02-01 VITALS — BP 142/72 | HR 100 | Temp 96.6°F | Ht 64.0 in | Wt 119.4 lb

## 2019-02-01 DIAGNOSIS — M25561 Pain in right knee: Secondary | ICD-10-CM

## 2019-02-01 DIAGNOSIS — M545 Low back pain, unspecified: Secondary | ICD-10-CM

## 2019-02-01 DIAGNOSIS — G20 Parkinson's disease: Secondary | ICD-10-CM | POA: Diagnosis not present

## 2019-02-01 DIAGNOSIS — Z0000 Encounter for general adult medical examination without abnormal findings: Secondary | ICD-10-CM

## 2019-02-01 DIAGNOSIS — D472 Monoclonal gammopathy: Secondary | ICD-10-CM

## 2019-02-01 MED ORDER — PREDNISONE 10 MG PO TABS: ORAL_TABLET | ORAL | 0 refills | Status: DC

## 2019-02-01 NOTE — Patient Instructions (Addendum)
[email or web address]  Take prednisone for 10 days.  2 tabs a day for 5 days, then 1 a day for 5 days, with food. Don't take with aleve/ibuprofen.  Let me check with Dr. Julien Nordmann in the meantime.   Please check on getting a fall alert button.   Take care.  Glad to see you.

## 2019-02-01 NOTE — Progress Notes (Signed)
This visit occurred during the SARS-CoV-2 public health emergency.  Safety protocols were in place, including screening questions prior to the visit, additional usage of staff PPE, and extensive cleaning of exam room while observing appropriate contact time as indicated for disinfecting solutions.  Flu 2020 Shingrix prev done.   PNA 2013 Tetanus 2011 Colonoscopy 2011, defer repeat now given pandemic.   Breast cancer screening 2020. F/u pending.   DXA done 2014-She has prolia through oncology.  (Dr. Julien Nordmann).   MGUS per oncology.  (Dr. Julien Nordmann).  Still on tamoxifen.  Compliant.  I will defer.  She agrees.  Parkinsons.  Fall cautions d/w pt. She was trying to sit and missed the chair, hit her head.  This was last summer.   She has a cane and rolling walker.  Gilford Rile helps when outside but it is tougher to maneuver in the house.  Fall button info given to patient.    She had prev knee injections.  Not having as much pain now.    She is still having back and L hip area pain.  She had prev injection, painful at the time but with relief thereafter.  She has taking ibuprofen prn.  She is on tramadol at baseline, daily, with a little relief.  No sedation on med. Some intermittent paresthesia in the B legs.  No weakness.    covid info d/w pt.  See avs.   PMH and SH reviewed  ROS: Per HPI unless specifically indicated in ROS section   Meds, vitals, and allergies reviewed.   GEN: nad, alert and oriented HEENT: ncat NECK: supple w/o LA CV: rrr. PULM: ctab, no inc wob ABD: soft, +bs EXT: no edema SKIN: no acute rash Tremor at baseline.   At least 30 minutes were devoted to patient care in this encounter (this can potentially include time spent reviewing the patient's file/history, interviewing and examining the patient, counseling/reviewing plan with patient, ordering referrals, ordering tests, reviewing relevant laboratory or x-ray data, and documenting the encounter).

## 2019-02-05 ENCOUNTER — Telehealth: Payer: Self-pay | Admitting: *Deleted

## 2019-02-05 ENCOUNTER — Other Ambulatory Visit: Payer: Self-pay | Admitting: Internal Medicine

## 2019-02-05 DIAGNOSIS — Z0000 Encounter for general adult medical examination without abnormal findings: Secondary | ICD-10-CM | POA: Insufficient documentation

## 2019-02-05 NOTE — Telephone Encounter (Signed)
Message to scheduler to make an appt for next Prolia injection in March

## 2019-02-05 NOTE — Assessment & Plan Note (Addendum)
She is still having back and L hip area pain.  She had prev injection, painful at the time but with relief thereafter.  She has taking ibuprofen prn.  She is on tramadol at baseline, daily, with a little relief.  No sedation on med. Some intermittent paresthesia in the B legs.  No weakness.   Continue tramadol as needed.  Reasonable to try a short course of prednisone with routine cautions.  See after visit summary.  She agrees.

## 2019-02-05 NOTE — Assessment & Plan Note (Signed)
MGUS per oncology.  (Dr. Julien Nordmann).  Still on tamoxifen.  Compliant.  I will defer.  She agrees.

## 2019-02-05 NOTE — Assessment & Plan Note (Signed)
Flu 2020 Shingrix prev done.   PNA 2013 Tetanus 2011 Colonoscopy 2011, defer repeat now given pandemic.   Breast cancer screening 2020. F/u pending.   DXA done 2014-She has prolia through oncology.  (Dr. Julien Nordmann).

## 2019-02-05 NOTE — Assessment & Plan Note (Signed)
Fall cautions d/w pt. She was trying to sit and missed the chair, hit her head.  This was last summer.   She has a cane and rolling walker.  Gilford Rile helps when outside but it is tougher to maneuver in the house.  Fall button info given to patient.   Continue current medications as is.

## 2019-02-05 NOTE — Telephone Encounter (Signed)
-----   Message from Curt Bears, MD sent at 02/05/2019  8:34 AM EST ----- [name], She received her injection every 6 months.  Last injection was supposed to be done on 10/16/2018 but I am not sure if she received it.I will ask my nurse to call her to come to the office and received her injection if she has not had it in September.Thank you. Mohamed ----- Message ----- From: Tonia Ghent, MD Sent: 02/05/2019  12:33 AM EST To: Curt Bears, MD  When is this patient due for Prolia again?  I was going to check with you about this since I thought it was being managed through your clinic.  I greatly appreciate your help.  Take care.  Brigitte Pulse

## 2019-02-05 NOTE — Assessment & Plan Note (Signed)
Improved with injection

## 2019-02-06 ENCOUNTER — Telehealth: Payer: Self-pay | Admitting: Internal Medicine

## 2019-02-06 ENCOUNTER — Telehealth: Payer: Self-pay | Admitting: Family Medicine

## 2019-02-06 NOTE — Telephone Encounter (Signed)
I thank all involved. 

## 2019-02-06 NOTE — Telephone Encounter (Signed)
Scheduled appt per 1/11 sch message- pt aware of appt date and time   

## 2019-02-06 NOTE — Telephone Encounter (Signed)
-----   Message from Patton Salles, RN sent at 02/05/2019  9:42 AM EST ----- She did receive it in September.  ----- Message ----- From: Curt Bears, MD Sent: 02/05/2019   8:34 AM EST To: Patton Salles, RN, Tonia Ghent, MD  Lindon Romp, She received her injection every 6 months.  Last injection was supposed to be done on 10/16/2018 but I am not sure if she received it.I will ask my nurse to call her to come to the office and received her injection if she has not had it in September.Thank you. Mohamed ----- Message ----- From: Tonia Ghent, MD Sent: 02/05/2019  12:33 AM EST To: Curt Bears, MD  When is this patient due for Prolia again?  I was going to check with you about this since I thought it was being managed through your clinic.  I greatly appreciate your help.  Take care.  Brigitte Pulse

## 2019-03-06 NOTE — Progress Notes (Addendum)
Virtual Visit via Video Note The purpose of this virtual visit is to provide medical care while limiting exposure to the novel coronavirus.    Consent was obtained for video visit:  Yes.   Answered questions that patient had about telehealth interaction:  Yes.   I discussed the limitations, risks, security and privacy concerns of performing an evaluation and management service by telemedicine. I also discussed with the patient that there may be a patient responsible charge related to this service. The patient expressed understanding and agreed to proceed.  Pt location: Home Physician Location: office Name of referring provider:  Tonia Ghent, MD I connected with [name] at patients initiation/request on 03/08/2019 at  1:30 PM EST by video enabled telemedicine application and verified that I am speaking with the correct person using two identifiers. Pt MRN:  [id number] Pt DOB:  Apr 29, 1940 Video Participants:  [name];     History of Present Illness:  Patient seen today in follow-up for Parkinson's disease. Scheduled for VV earlier this morning but did not respond to links or phone call.  Called back and asked to r/s for now as internet down this morning.   Medical records are reviewed since last visit.  Pt had one fall and it was yesterday; she was coming out of the store and missed the curb and fell.  She scratched her knees.  She is back to walking for exercise 30 min per day.  Pt denies lightheadedness, near syncope.  No hallucinations.  Mood has been good.  Last saw primary care on January 7.  Current movement d/o meds: Carbidopa/levodopa 25/100, 1.5 tablets 3 times per day Amantadine 100 mg 3 times per day  Effexor XR, 75 mg daily Clonazepam 0.5 mg, half tablet at night   Current Outpatient Medications on File Prior to Visit  Medication Sig Dispense Refill  . amantadine (SYMMETREL) 100 MG capsule Take 1 capsule (100 mg total) by mouth 3 (three) times daily.  270 capsule 1  . aspirin 81 MG tablet Take 81 mg by mouth daily.      [name] b complex vitamins tablet Take 1 tablet by mouth daily.    . Calcium 1500 MG tablet Take 1,500 mg by mouth.      . carbidopa-levodopa (SINEMET IR) 25-100 MG tablet TAKE 1 & 1/2 (ONE & ONE-HALF) TABLETS BY MOUTH THREE TIMES DAILY 405 tablet 1  . Cholecalciferol 5000 units TABS Take 1 tablet (5,000 Units total) by mouth daily.    . clonazePAM (KLONOPIN) 0.5 MG tablet TAKE 1/2 (ONE-HALF) TABLET BY MOUTH AT BEDTIME 45 tablet 1  . Denosumab (PROLIA [place]) Inject into the skin every 6 (six) months.    . Elastic Bandages & Supports (ABDOMINAL BINDER/ELASTIC SMALL) MISC 1 Device by Does not apply route daily. 1 each 0  . Ferrous Sulfate (IRON) 325 (65 Fe) MG TABS Take 325 mg by mouth.    . Multiple Vitamin (MULTIVITAMIN) capsule 2 tabs po qd     . Omega-3 Fatty Acids (OMEGA 3 PO) 1 tab po qd     . omeprazole (PRILOSEC) 20 MG capsule Take 1 capsule by mouth once daily 90 capsule 3  . OnabotulinumtoxinA (BOTOX IM) Inject into the muscle every 3 (three) months. Injection every 3 months    . predniSONE (DELTASONE) 10 MG tablet Take 2 a day for 5 days, then 1 a day for 5 days, with food. Don't take with aleve/ibuprofen. 15 tablet 0  . tamoxifen (NOLVADEX) 20  MG tablet Take 1 tablet (20 mg total) by mouth daily. 90 tablet 3  . traMADol (ULTRAM) 50 MG tablet Take 1 tablet (50 mg total) by mouth daily as needed. 90 tablet 0  . venlafaxine XR (EFFEXOR-XR) 75 MG 24 hr capsule TAKE 1 CAPSULE BY MOUTH ONCE DAILY WITH BREAKFAST 90 capsule 1  . vitamin B-12 (CYANOCOBALAMIN) 500 MCG tablet Take 2500 mcg daily.     No current facility-administered medications on file prior to visit.     Observations/Objective:   There were no vitals filed for this visit. GEN:  The patient appears stated age and is in NAD.  Neurological examination:  Orientation: The patient is alert and oriented x3.  Some psychomotor retardation Cranial nerves: There is good  facial symmetry. There is facial hypomimia.  The speech is fluent and clear. Soft palate rises symmetrically and there is no tongue deviation. Hearing is intact to conversational tone. Motor: Strength is at least antigravity x 4.   Shoulder shrug is equal and symmetric.  There is no pronator drift.  Movement examination: Tone: unable Abnormal movements: she has oral dyskinesia Coordination:  There is no decremation with RAM's, with hand opening and closing but video quality was poor Gait and Station: was unable to allow me to view    Assessment and Plan:   1.  Parkinsons Disease  -Continue carbidopa/levodopa 25/100, 1.5 tablets 3 times per day  -Continue amantadine, 100 mg 3 times per day for dyskinesia.  Still has some but not bothersome  -exercise  -next covid vaccine (2nd) tomorrow.  Discussed this today  2.  Sialorrhea  -This is commonly associated with PD.  We talked about treatments.  The patient is not a candidate for oral anticholinergic therapy because of increased risk of confusion and falls.  We discussed Botox (type A and B) and 1% atropine drops.  We discusssed that candy like lemon drops can help by stimulating mm of the oropharynx to induce swallowing.  -last myobloc on 12/15/18  3.  Livedo reticularis  -Consequence of amantadine.  Discussed this.  Patient understands, but wants to remain on amantadine  4.  RBD  -Doing well with clonazepam 0.5 mg, half tablet at night  Follow Up Instructions:   5 months in person  -I discussed the assessment and treatment plan with the patient. The patient was provided an opportunity to ask questions and all were answered. The patient agreed with the plan and demonstrated an understanding of the instructions.   The patient was advised to call back or seek an in-person evaluation if the symptoms worsen or if the condition fails to improve as anticipated.    Total time spent on today's visit was 30 minutes, including both face-to-face  time and nonface-to-face time.  Time included that spent on review of records (prior notes available to me/labs/imaging if pertinent), discussing treatment and goals, answering patient's questions and coordinating care.   Alonza Bogus, DO

## 2019-03-07 ENCOUNTER — Encounter: Payer: Self-pay | Admitting: Neurology

## 2019-03-08 ENCOUNTER — Telehealth (INDEPENDENT_AMBULATORY_CARE_PROVIDER_SITE_OTHER): Payer: Medicare Other | Admitting: Neurology

## 2019-03-08 ENCOUNTER — Other Ambulatory Visit: Payer: Self-pay

## 2019-03-08 DIAGNOSIS — G249 Dystonia, unspecified: Secondary | ICD-10-CM | POA: Diagnosis not present

## 2019-03-08 DIAGNOSIS — G20 Parkinson's disease: Secondary | ICD-10-CM | POA: Diagnosis not present

## 2019-03-08 DIAGNOSIS — R231 Pallor: Secondary | ICD-10-CM | POA: Diagnosis not present

## 2019-03-08 DIAGNOSIS — G4752 REM sleep behavior disorder: Secondary | ICD-10-CM

## 2019-03-08 NOTE — Addendum Note (Signed)
Addended by: Alonza Bogus S on: 03/08/2019 01:28 PM   Modules accepted: Level of Service

## 2019-03-08 NOTE — Addendum Note (Signed)
Addended by: Alonza Bogus S on: 03/08/2019 01:29 PM   Modules accepted: Level of Service

## 2019-03-29 ENCOUNTER — Encounter: Payer: Self-pay | Admitting: *Deleted

## 2019-03-29 NOTE — Progress Notes (Addendum)
Faxed Benefit verification to [facility]  Waiting for reply They replied today with acknowledgement they received a BV request. Service request # is [id number] Support line [phone] option #4 M_F 8-8 ET   Received fax Buy ans Bill covered  Fax sent to scan into her chart

## 2019-03-30 ENCOUNTER — Other Ambulatory Visit: Payer: Self-pay

## 2019-03-30 ENCOUNTER — Inpatient Hospital Stay: Payer: Medicare Other | Attending: Internal Medicine

## 2019-03-30 VITALS — BP 128/72 | HR 78 | Temp 98.2°F | Resp 18

## 2019-03-30 DIAGNOSIS — G20 Parkinson's disease: Secondary | ICD-10-CM | POA: Diagnosis not present

## 2019-03-30 DIAGNOSIS — E785 Hyperlipidemia, unspecified: Secondary | ICD-10-CM | POA: Insufficient documentation

## 2019-03-30 DIAGNOSIS — Z9181 History of falling: Secondary | ICD-10-CM | POA: Diagnosis not present

## 2019-03-30 DIAGNOSIS — C50011 Malignant neoplasm of nipple and areola, right female breast: Secondary | ICD-10-CM

## 2019-03-30 DIAGNOSIS — D472 Monoclonal gammopathy: Secondary | ICD-10-CM | POA: Insufficient documentation

## 2019-03-30 DIAGNOSIS — M2550 Pain in unspecified joint: Secondary | ICD-10-CM | POA: Insufficient documentation

## 2019-03-30 DIAGNOSIS — Z79899 Other long term (current) drug therapy: Secondary | ICD-10-CM | POA: Diagnosis not present

## 2019-03-30 MED ORDER — DENOSUMAB 60 MG/ML ~~LOC~~ SOSY
PREFILLED_SYRINGE | SUBCUTANEOUS | Status: AC
  Filled 2019-03-30: qty 1

## 2019-03-30 MED ORDER — DENOSUMAB 60 MG/ML ~~LOC~~ SOSY
60.0000 mg | PREFILLED_SYRINGE | Freq: Once | SUBCUTANEOUS | Status: AC
  Administered 2019-03-30: 60 mg via SUBCUTANEOUS

## 2019-03-30 NOTE — Patient Instructions (Signed)
Denosumab injection What is this medicine? DENOSUMAB (den oh sue mab) slows bone breakdown. Prolia is used to treat osteoporosis in women after menopause and in men, and in people who are taking corticosteroids for 6 months or more. Delton See is used to treat a high calcium level due to cancer and to prevent bone fractures and other bone problems caused by multiple myeloma or cancer bone metastases. Delton See is also used to treat giant cell tumor of the bone. This medicine may be used for other purposes; ask your health care provider or pharmacist if you have questions. COMMON BRAND NAME(S): Prolia, XGEVA What should I tell my health care provider before I take this medicine? They need to know if you have any of these conditions:  dental disease  having surgery or tooth extraction  infection  kidney disease  low levels of calcium or Vitamin D in the blood  malnutrition  on hemodialysis  skin conditions or sensitivity  thyroid or parathyroid disease  an unusual reaction to denosumab, other medicines, foods, dyes, or preservatives  pregnant or trying to get pregnant  breast-feeding How should I use this medicine? This medicine is for injection under the skin. It is given by a health care professional in a hospital or clinic setting. A special MedGuide will be given to you before each treatment. Be sure to read this information carefully each time. For Prolia, talk to your pediatrician regarding the use of this medicine in children. Special care may be needed. For Delton See, talk to your pediatrician regarding the use of this medicine in children. While this drug may be prescribed for children as young as 13 years for selected conditions, precautions do apply. Overdosage: If you think you have taken too much of this medicine contact a poison control center or emergency room at once. NOTE: This medicine is only for you. Do not share this medicine with others. What if I miss a dose? It is  important not to miss your dose. Call your doctor or health care professional if you are unable to keep an appointment. What may interact with this medicine? Do not take this medicine with any of the following medications:  other medicines containing denosumab This medicine may also interact with the following medications:  medicines that lower your chance of fighting infection  steroid medicines like prednisone or cortisone This list may not describe all possible interactions. Give your health care provider a list of all the medicines, herbs, non-prescription drugs, or dietary supplements you use. Also tell them if you smoke, drink alcohol, or use illegal drugs. Some items may interact with your medicine. What should I watch for while using this medicine? Visit your doctor or health care professional for regular checks on your progress. Your doctor or health care professional may order blood tests and other tests to see how you are doing. Call your doctor or health care professional for advice if you get a fever, chills or sore throat, or other symptoms of a cold or flu. Do not treat yourself. This drug may decrease your body's ability to fight infection. Try to avoid being around people who are sick. You should make sure you get enough calcium and vitamin D while you are taking this medicine, unless your doctor tells you not to. Discuss the foods you eat and the vitamins you take with your health care professional. See your dentist regularly. Brush and floss your teeth as directed. Before you have any dental work done, tell your dentist you are  receiving this medicine. Do not become pregnant while taking this medicine or for 5 months after stopping it. Talk with your doctor or health care professional about your birth control options while taking this medicine. Women should inform their doctor if they wish to become pregnant or think they might be pregnant. There is a potential for serious side  effects to an unborn child. Talk to your health care professional or pharmacist for more information. What side effects may I notice from receiving this medicine? Side effects that you should report to your doctor or health care professional as soon as possible:  allergic reactions like skin rash, itching or hives, swelling of the face, lips, or tongue  bone pain  breathing problems  dizziness  jaw pain, especially after dental work  redness, blistering, peeling of the skin  signs and symptoms of infection like fever or chills; cough; sore throat; pain or trouble passing urine  signs of low calcium like fast heartbeat, muscle cramps or muscle pain; pain, tingling, numbness in the hands or feet; seizures  unusual bleeding or bruising  unusually weak or tired Side effects that usually do not require medical attention (report to your doctor or health care professional if they continue or are bothersome):  constipation  diarrhea  headache  joint pain  loss of appetite  muscle pain  runny nose  tiredness  upset stomach This list may not describe all possible side effects. Call your doctor for medical advice about side effects. You may report side effects to FDA at 1-800-FDA-1088. Where should I keep my medicine? This medicine is only given in a clinic, doctor's office, or other health care setting and will not be stored at home. NOTE: This sheet is a summary. It may not cover all possible information. If you have questions about this medicine, talk to your doctor, pharmacist, or health care provider.  2020 Elsevier/Gold Standard (2017-05-20 16:10:44)

## 2019-03-30 NOTE — Progress Notes (Signed)
Per Dr Julien Nordmann it is okay togive Prolia today and use creatinine and calcium  from 01/29/2019.

## 2019-04-06 ENCOUNTER — Other Ambulatory Visit: Payer: Self-pay | Admitting: Neurology

## 2019-04-11 DIAGNOSIS — H43813 Vitreous degeneration, bilateral: Secondary | ICD-10-CM | POA: Diagnosis not present

## 2019-04-16 DIAGNOSIS — Z1231 Encounter for screening mammogram for malignant neoplasm of breast: Secondary | ICD-10-CM | POA: Diagnosis not present

## 2019-04-16 LAB — HM MAMMOGRAPHY

## 2019-04-20 ENCOUNTER — Ambulatory Visit: Payer: Medicare Other | Admitting: Neurology

## 2019-04-26 ENCOUNTER — Ambulatory Visit (INDEPENDENT_AMBULATORY_CARE_PROVIDER_SITE_OTHER): Payer: Medicare Other | Admitting: Neurology

## 2019-04-26 ENCOUNTER — Other Ambulatory Visit: Payer: Self-pay

## 2019-04-26 DIAGNOSIS — K117 Disturbances of salivary secretion: Secondary | ICD-10-CM

## 2019-04-26 MED ORDER — INCOBOTULINUMTOXINA 100 UNITS IM SOLR
100.0000 [IU] | INTRAMUSCULAR | Status: DC
  Administered 2019-04-26: 100 [IU] via INTRAMUSCULAR

## 2019-04-26 NOTE — Procedures (Signed)
Botulinum Clinic   History:  Diagnosis: Sialorrhea associated with PD (icd10: K11.7)    Informed consent was obtained.  Discussed differences between myobloc and xeomin.  The patient was educated on the botulinum toxin the black blox warning and given a copy of the botox patient medication guide.  The patient understands that this warning states that there have been reported cases of the Botox extending beyond the injection site and creating adverse effects, similar to those of botulism. This included loss of strength, trouble walking, hoarseness, trouble saying words clearly, loss of bladder control, trouble breathing, trouble swallowing, diplopia, blurry vision and ptosis. Most of the distant spread of Botox was happening in patients, primarily children, who received medication for spasticity or for cervical dystonia. The patient expressed understanding and desire to proceed.   Injections  Location Left  Right Units Number of sites  Parotid (point 1 on picture) 30 30 60 1 per side  Submandibular (point 2) 20 20 40 1 per side  TOTAL UNITS:   100    Type of Toxin: Xeomin Discarded Units: 0  Needle drawback with each injection was free of blood. Pt tolerated procedure well without complications.   Reinjection is anticipated in 4 months.

## 2019-05-04 ENCOUNTER — Encounter: Payer: Self-pay | Admitting: Family Medicine

## 2019-05-08 ENCOUNTER — Other Ambulatory Visit: Payer: Self-pay | Admitting: Family Medicine

## 2019-05-29 ENCOUNTER — Ambulatory Visit: Payer: Medicare Other | Admitting: Family Medicine

## 2019-06-04 ENCOUNTER — Encounter: Payer: Self-pay | Admitting: Family Medicine

## 2019-06-04 ENCOUNTER — Ambulatory Visit (INDEPENDENT_AMBULATORY_CARE_PROVIDER_SITE_OTHER)
Admission: RE | Admit: 2019-06-04 | Discharge: 2019-06-04 | Disposition: A | Discharge status: Home / Self Care | Payer: Medicare Other | Source: Ambulatory Visit | Attending: Family Medicine | Admitting: Family Medicine

## 2019-06-04 ENCOUNTER — Ambulatory Visit (INDEPENDENT_AMBULATORY_CARE_PROVIDER_SITE_OTHER): Payer: Medicare Other | Admitting: Family Medicine

## 2019-06-04 ENCOUNTER — Other Ambulatory Visit: Payer: Self-pay

## 2019-06-04 VITALS — BP 124/76 | HR 91 | Temp 97.6°F | Ht 64.0 in | Wt 118.3 lb

## 2019-06-04 DIAGNOSIS — M545 Low back pain, unspecified: Secondary | ICD-10-CM

## 2019-06-04 MED ORDER — TRAMADOL HCL 50 MG PO TABS: 50.0000 mg | ORAL_TABLET | Freq: Two times a day (BID) | ORAL | 1 refills | Status: DC | PRN

## 2019-06-04 NOTE — Progress Notes (Signed)
This visit occurred during the SARS-CoV-2 public health emergency.  Safety protocols were in place, including screening questions prior to the visit, additional usage of staff PPE, and extensive cleaning of exam room while observing appropriate contact time as indicated for disinfecting solutions.  Mult falls in the last few months, with parkinson's hx noted. No LOC.  No ER eval.  Discussed options.  B lower back pain.  No leg pain but "a crawling sensation" on the B legs that has been going on for about a few months.    She had seen Dr. Ron Agee with prev injections noted.  Those helped temporarily with back pain.  Tramadol helped some prev.  She is out of med now.  She didn't attribute fall to tramadol use.  We talked about fall risk reduction, ie PT vs cane.  I am checking with staff to see about options for lightweight Rollator.  Meds, vitals, and allergies reviewed.   ROS: Per HPI unless specifically indicated in ROS section   GEN: nad, alert and oriented HEENT: ncat NECK: supple w/o LA CV: rrr.  PULM: ctab, no inc wob ABD: soft, +bs EXT: Trace BLE edema SKIN: Well-perfused Lower midline back tender to palpation at left trochanteric bursa tender to palpation.  No pain on hip range of motion otherwise.  Able to bear weight.

## 2019-06-04 NOTE — Patient Instructions (Addendum)
Use ice as needed.  That may help some.  Take tylenol with tramadol for pain.  Go to the lab on the way out for an xray.   If you have mychart we'll likely use that to update you.    We'll call about setting up PT.  If not better then let me know.   I will see about getting a lightweight rollator.   Take care.  Glad to see you.

## 2019-06-06 DIAGNOSIS — S335XXD Sprain of ligaments of lumbar spine, subsequent encounter: Secondary | ICD-10-CM | POA: Diagnosis not present

## 2019-06-06 DIAGNOSIS — M545 Low back pain: Secondary | ICD-10-CM | POA: Diagnosis not present

## 2019-06-06 NOTE — Assessment & Plan Note (Signed)
Discussed with patient about options. Use ice as needed.  That may help some.  Take tylenol with tramadol for pain.  See notes on imaging. We'll call about setting up PT.  If not better then let she will me know.   I have messaged staff about getting a Sports administrator.    I will ask for input from neurology about the creeping/crawling sensation she has on her legs.  This appears to be a separate issue.

## 2019-06-11 DIAGNOSIS — S335XXD Sprain of ligaments of lumbar spine, subsequent encounter: Secondary | ICD-10-CM | POA: Diagnosis not present

## 2019-06-11 DIAGNOSIS — M545 Low back pain: Secondary | ICD-10-CM | POA: Diagnosis not present

## 2019-06-26 ENCOUNTER — Telehealth: Payer: Self-pay | Admitting: Family Medicine

## 2019-06-26 NOTE — Telephone Encounter (Signed)
  Community Resource Referral   [place] 06/26/2019  1st Attempt  DOB: [birth year]   AGE: 79 y.o.   GENDER: female   PCP Tonia Ghent, MD.   Called pt regarding Community Resource Referral LMTCB Follow up on: 06/27/2019  Notes: Pt needing assistance on a light weight rollator.  [place] . [email or web address]  [phone]  ye

## 2019-06-28 NOTE — Telephone Encounter (Signed)
°  Community Resource Referral   [place] 06/28/2019   DOB: 1940-07-15    AGE: 79 y.o.    GENDER: female    PCP Tonia Ghent, MD.   Called pt regarding Community Resource Referral she stated that [facility], [place], [place] [address] had the light weight rollator. I called the pharmacy and spoke with Quita Skye he stated that they did take Medicare and if her BCBS supplement was not accepted that they would help her find another pharmacy that would accept her insurance.  Dr. [name] and Terri Skains Please fax the prescription for the lightweight rollator to [facility] [phone]  Include ICD-10 Code  Thank you,  [email or web address]   [id number]

## 2019-06-29 ENCOUNTER — Encounter: Payer: Self-pay | Admitting: Family Medicine

## 2019-06-29 NOTE — Telephone Encounter (Signed)
Order printed and faxed to [facility].

## 2019-06-29 NOTE — Telephone Encounter (Signed)
[name], please fax the order for the lightweight rollator to [facility] [place]  Dx G24.9, G20.   Thanks.

## 2019-07-05 ENCOUNTER — Other Ambulatory Visit: Payer: Self-pay | Admitting: Neurology

## 2019-07-16 NOTE — Telephone Encounter (Signed)
Returned phone msg to Ms. Woolford letting her know that the order had been placed to [facility] on 6/4.

## 2019-07-17 DIAGNOSIS — M545 Low back pain: Secondary | ICD-10-CM | POA: Diagnosis not present

## 2019-07-19 ENCOUNTER — Other Ambulatory Visit: Payer: Self-pay | Admitting: Family Medicine

## 2019-07-19 NOTE — Telephone Encounter (Signed)
Electronic refill request. Tramadol Last office visit:   06/04/2019 Last Filled:    60 tablet 1 06/04/2019  Please advise.

## 2019-07-20 NOTE — Telephone Encounter (Signed)
Sent. Thanks.   

## 2019-08-01 DIAGNOSIS — M4120 Other idiopathic scoliosis, site unspecified: Secondary | ICD-10-CM | POA: Diagnosis not present

## 2019-08-01 DIAGNOSIS — M545 Low back pain: Secondary | ICD-10-CM | POA: Diagnosis not present

## 2019-08-07 ENCOUNTER — Other Ambulatory Visit: Payer: Self-pay | Admitting: Neurology

## 2019-08-08 DIAGNOSIS — M545 Low back pain: Secondary | ICD-10-CM | POA: Diagnosis not present

## 2019-08-08 DIAGNOSIS — M4120 Other idiopathic scoliosis, site unspecified: Secondary | ICD-10-CM | POA: Diagnosis not present

## 2019-08-08 DIAGNOSIS — M47816 Spondylosis without myelopathy or radiculopathy, lumbar region: Secondary | ICD-10-CM | POA: Diagnosis not present

## 2019-08-09 NOTE — Progress Notes (Deleted)
Assessment/Plan:   1.  Parkinsons Disease  -***Continue carbidopa/levodopa 25/100, 1-1/2 tablets 3 times per day  -Continue amantadine 100 mg 3 times per day 2.  Sialorrhea  -Has upcoming Xeomin repeat injections in August.  3.  Lower extremity paresthesias, with history of MGUS  -Could be peripheral neuropathy, could be restless leg.  Do B12, TSH, CBC, ferritin, iron  Subjective:   [name] was seen today in follow up for Parkinsons disease.  My previous records were reviewed prior to todays visit as well as outside records available to me. Pt has had several falls since our last visit.  Saw primary care and he did give her a prescription for a Rollator.  Pt denies lightheadedness, near syncope.  No hallucinations.  Mood has been good.  Xeomin is working well.  Last injections were on April 1 for sialorrhea.  Received email from primary care on May 13 asking me about a "crawling sensation" in the patient's lower extremities.  Patient does have a history of MGUS.  Current prescribed movement disorder medications: ***Carbidopa/levodopa 25/100, 1.5 tablets 3 times per day Amantadine, 100 mg, 1 tablet 3 times per day   PREVIOUS MEDICATIONS: {Parkinson's RX:18200}  ALLERGIES:   Allergies  Allergen Reactions  . Penicillins Shortness Of Breath and Itching    CURRENT MEDICATIONS:  Outpatient Encounter Medications as of 08/13/2019  Medication Sig  . amantadine (SYMMETREL) 100 MG capsule TAKE (1) CAPSULE BY MOUTH THREE TIMES DAILY  . aspirin 81 MG tablet Take 81 mg by mouth daily.    [name] b complex vitamins tablet Take 1 tablet by mouth daily.  . Calcium 1500 MG tablet Take 1,500 mg by mouth.    . carbidopa-levodopa (SINEMET IR) 25-100 MG tablet TAKE 1 & 1/2 TABLETS BY MOUTH THREE TIMES DAILY  . Cholecalciferol 5000 units TABS Take 1 tablet (5,000 Units total) by mouth daily.  . clonazePAM (KLONOPIN) 0.5 MG tablet TAKE 1/2 (ONE-HALF) TABLET BY MOUTH AT BEDTIME  . Denosumab  (PROLIA [place]) Inject into the skin every 6 (six) months.  . Elastic Bandages & Supports (ABDOMINAL BINDER/ELASTIC SMALL) MISC 1 Device by Does not apply route daily.  . Ferrous Sulfate (IRON) 325 (65 Fe) MG TABS Take 325 mg by mouth.  . Multiple Vitamin (MULTIVITAMIN) capsule 2 tabs po qd   . Omega-3 Fatty Acids (OMEGA 3 PO) 1 tab po qd   . omeprazole (PRILOSEC) 20 MG capsule TAKE (1) CAPSULE BY MOUTH ONCE DAILY.  . OnabotulinumtoxinA (BOTOX IM) Inject into the muscle every 3 (three) months. Injection every 3 months  . tamoxifen (NOLVADEX) 20 MG tablet Take 1 tablet (20 mg total) by mouth daily.  . traMADol (ULTRAM) 50 MG tablet TAKE (1) TABLET BY MOUTH EVERY TWELVE HOURS AS NEEDED FOR PAIN.  [name] venlafaxine XR (EFFEXOR-XR) 75 MG 24 hr capsule TAKE 1 CAPSULE BY MOUTH ONCE DAILY WITH BREAKFAST  . vitamin B-12 (CYANOCOBALAMIN) 500 MCG tablet Take 2500 mcg daily.   No facility-administered encounter medications on file as of 08/13/2019.    Objective:   PHYSICAL EXAMINATION:    VITALS:  There were no vitals filed for this visit.  GEN:  The patient appears stated age and is in NAD. HEENT:  Normocephalic, atraumatic.  The mucous membranes are moist. The superficial temporal arteries are without ropiness or tenderness. CV:  RRR Lungs:  CTAB Neck/HEME:  There are no carotid bruits bilaterally.  Neurological examination:  Orientation: The patient is alert and oriented x3. Cranial nerves: There  is good facial symmetry with*** facial hypomimia. The speech is fluent and clear. Soft palate rises symmetrically and there is no tongue deviation. Hearing is intact to conversational tone. Sensation: Sensation is intact to light touch throughout Motor: Strength is at least antigravity x4.  Movement examination: Tone: There is ***tone in the *** Abnormal movements: *** Coordination:  There is *** decremation with RAM's, *** Gait and Station: The patient has *** difficulty arising out of a deep-seated  chair without the use of the hands. The patient's stride length is ***.  The patient has a *** pull test.     I have reviewed and interpreted the following labs independently    Chemistry      Component Value Date/Time   NA 142 01/29/2019 0949   NA 141 10/13/2016 1141   K 3.9 01/29/2019 0949   K 3.6 10/13/2016 1141   CL 104 01/29/2019 0949   CL 107 03/21/2012 1501   CO2 27 01/29/2019 0949   CO2 27 10/13/2016 1141   BUN 17 01/29/2019 0949   BUN 20.0 10/13/2016 1141   CREATININE 0.75 01/29/2019 0949   CREATININE 0.81 10/16/2018 1327   CREATININE 0.8 10/13/2016 1141      Component Value Date/Time   CALCIUM 9.2 01/29/2019 0949   CALCIUM 9.5 10/13/2016 1141   ALKPHOS 53 10/16/2018 1327   ALKPHOS 50 10/13/2016 1141   AST 19 10/16/2018 1327   AST 25 10/13/2016 1141   ALT 14 10/16/2018 1327   ALT 17 10/13/2016 1141   BILITOT 0.3 10/16/2018 1327   BILITOT 0.27 10/13/2016 1141       Lab Results  Component Value Date   WBC 7.6 10/16/2018   HGB 12.9 10/16/2018   HCT 39.7 10/16/2018   MCV 102.3 (H) 10/16/2018   PLT 209 10/16/2018    Lab Results  Component Value Date   TSH 3.44 01/29/2019     Total time spent on today's visit was ***30 minutes, including both face-to-face time and nonface-to-face time.  Time included that spent on review of records (prior notes available to me/labs/imaging if pertinent), discussing treatment and goals, answering patient's questions and coordinating care.  Cc:  Tonia Ghent, MD

## 2019-08-13 ENCOUNTER — Ambulatory Visit: Payer: Medicare Other | Admitting: Neurology

## 2019-08-20 ENCOUNTER — Other Ambulatory Visit: Payer: Self-pay | Admitting: Family Medicine

## 2019-08-23 DIAGNOSIS — M4120 Other idiopathic scoliosis, site unspecified: Secondary | ICD-10-CM | POA: Diagnosis not present

## 2019-08-23 DIAGNOSIS — M545 Low back pain: Secondary | ICD-10-CM | POA: Diagnosis not present

## 2019-09-07 ENCOUNTER — Ambulatory Visit (INDEPENDENT_AMBULATORY_CARE_PROVIDER_SITE_OTHER): Payer: Medicare Other | Admitting: Neurology

## 2019-09-07 ENCOUNTER — Other Ambulatory Visit: Payer: Self-pay

## 2019-09-07 DIAGNOSIS — K117 Disturbances of salivary secretion: Secondary | ICD-10-CM | POA: Diagnosis not present

## 2019-09-07 MED ORDER — CLONAZEPAM 0.5 MG PO TABS: ORAL_TABLET | ORAL | 0 refills | Status: DC

## 2019-09-07 MED ORDER — INCOBOTULINUMTOXINA 100 UNITS IM SOLR
100.0000 [IU] | INTRAMUSCULAR | Status: DC
  Administered 2019-09-07: 100 [IU] via INTRAMUSCULAR

## 2019-09-07 NOTE — Procedures (Signed)
Botulinum Clinic   History:  Diagnosis: Sialorrhea associated with PD (icd10: K11.7)    Informed consent was obtained.  Discussed differences between myobloc and xeomin.  The patient was educated on the botulinum toxin the black blox warning and given a copy of the botox patient medication guide.  The patient understands that this warning states that there have been reported cases of the Botox extending beyond the injection site and creating adverse effects, similar to those of botulism. This included loss of strength, trouble walking, hoarseness, trouble saying words clearly, loss of bladder control, trouble breathing, trouble swallowing, diplopia, blurry vision and ptosis. Most of the distant spread of Botox was happening in patients, primarily children, who received medication for spasticity or for cervical dystonia. The patient expressed understanding and desire to proceed.   Injections  Location Left  Right Units Number of sites  Parotid (point 1 on picture) 30 30 60 1 per side  Submandibular (point 2) 20 20 40 1 per side  TOTAL UNITS:   100    Type of Toxin: Xeomin Discarded Units: 0  Needle drawback with each injection was free of blood. Pt tolerated procedure well without complications.   Reinjection is anticipated in 4 months.

## 2019-09-13 ENCOUNTER — Other Ambulatory Visit: Payer: Self-pay | Admitting: Neurology

## 2019-09-16 ENCOUNTER — Other Ambulatory Visit: Payer: Self-pay | Admitting: Family Medicine

## 2019-09-16 DIAGNOSIS — M810 Age-related osteoporosis without current pathological fracture: Secondary | ICD-10-CM

## 2019-09-16 DIAGNOSIS — D7589 Other specified diseases of blood and blood-forming organs: Secondary | ICD-10-CM

## 2019-09-16 DIAGNOSIS — D539 Nutritional anemia, unspecified: Secondary | ICD-10-CM

## 2019-09-16 DIAGNOSIS — G20 Parkinson's disease: Secondary | ICD-10-CM

## 2019-09-16 DIAGNOSIS — D649 Anemia, unspecified: Secondary | ICD-10-CM

## 2019-09-18 NOTE — Telephone Encounter (Signed)
Rx(s) sent to pharmacy electronically.  

## 2019-10-02 NOTE — Progress Notes (Deleted)
Assessment/Plan:   1.   Parkinsons Disease  -***Continue carbidopa/levodopa 25/100, 1-1/2 tablets 3 times per day  -Continue amantadine 100 mg 3 times per day 2.  Sialorrhea  -continue xeomin injections  3.  Lower extremity paresthesias, with history of MGUS  -Could be peripheral neuropathy, could be restless leg.  Do B12, TSH, CBC, ferritin, iron (actually all already ordered by PCP - will have them collected here today)  Subjective:   [name] was seen today in follow up for Parkinsons disease.  My previous records were reviewed prior to todays visit as well as outside records available to me. Pt has had several falls since our last visit.  Saw primary care and he did give her a prescription for a Rollator.  Pt denies lightheadedness, near syncope.  No hallucinations.  Mood has been good.  Xeomin is working well.  Last injections were on 8/13 for sialorrhea.  Received email from primary care on May 13 asking me about a "crawling sensation" in the patient's lower extremities.  Patient does have a history of MGUS.  Current prescribed movement disorder medications: ***Carbidopa/levodopa 25/100, 1.5 tablets 3 times per day Amantadine, 100 mg, 1 tablet 3 times per day Current prescribed movement disorder medications: ***   PREVIOUS MEDICATIONS: {Parkinson's RX:18200}  ALLERGIES:   Allergies  Allergen Reactions  . Penicillins Shortness Of Breath and Itching    CURRENT MEDICATIONS:  Outpatient Encounter Medications as of 10/04/2019  Medication Sig  . amantadine (SYMMETREL) 100 MG capsule TAKE (1) CAPSULE BY MOUTH THREE TIMES DAILY  . aspirin 81 MG tablet Take 81 mg by mouth daily.    [name] b complex vitamins tablet Take 1 tablet by mouth daily.  . Calcium 1500 MG tablet Take 1,500 mg by mouth.    . carbidopa-levodopa (SINEMET IR) 25-100 MG tablet TAKE 1 & 1/2 TABLETS BY MOUTH THREE TIMES DAILY  . Cholecalciferol 5000 units TABS Take 1 tablet (5,000 Units total) by mouth  daily.  . clonazePAM (KLONOPIN) 0.5 MG tablet TAKE 1/2 (ONE-HALF) TABLET BY MOUTH AT BEDTIME  . Denosumab (PROLIA [place]) Inject into the skin every 6 (six) months.  . Elastic Bandages & Supports (ABDOMINAL BINDER/ELASTIC SMALL) MISC 1 Device by Does not apply route daily.  . Ferrous Sulfate (IRON) 325 (65 Fe) MG TABS Take 325 mg by mouth.  . Multiple Vitamin (MULTIVITAMIN) capsule 2 tabs po qd   . Omega-3 Fatty Acids (OMEGA 3 PO) 1 tab po qd   . omeprazole (PRILOSEC) 20 MG capsule TAKE (1) CAPSULE BY MOUTH ONCE DAILY.  . OnabotulinumtoxinA (BOTOX IM) Inject into the muscle every 3 (three) months. Injection every 3 months  . tamoxifen (NOLVADEX) 20 MG tablet Take 1 tablet (20 mg total) by mouth daily.  . traMADol (ULTRAM) 50 MG tablet TAKE (1) TABLET BY MOUTH EVERY TWELVE HOURS AS NEEDED FOR PAIN.  [name] venlafaxine XR (EFFEXOR-XR) 75 MG 24 hr capsule TAKE 1 CAPSULE BY MOUTH ONCE DAILY WITH BREAKFAST  . vitamin B-12 (CYANOCOBALAMIN) 500 MCG tablet Take 2500 mcg daily.   Facility-Administered Encounter Medications as of 10/04/2019  Medication  . incobotulinumtoxinA (XEOMIN) 100 units injection 100 Units    Objective:   PHYSICAL EXAMINATION:    VITALS:  There were no vitals filed for this visit.  GEN:  The patient appears stated age and is in NAD. HEENT:  Normocephalic, atraumatic.  The mucous membranes are moist. The superficial temporal arteries are without ropiness or tenderness. CV:  RRR Lungs:  CTAB  Neck/HEME:  There are no carotid bruits bilaterally.  Neurological examination:  Orientation: The patient is alert and oriented x3. Cranial nerves: There is good facial symmetry with*** facial hypomimia. The speech is fluent and clear. Soft palate rises symmetrically and there is no tongue deviation. Hearing is intact to conversational tone. Sensation: Sensation is intact to light touch throughout Motor: Strength is at least antigravity x4.  Movement examination: Tone: There is ***tone in  the *** Abnormal movements: *** Coordination:  There is *** decremation with RAM's, *** Gait and Station: The patient has *** difficulty arising out of a deep-seated chair without the use of the hands. The patient's stride length is ***.  The patient has a *** pull test.     I have reviewed and interpreted the following labs independently    Chemistry      Component Value Date/Time   NA 142 01/29/2019 0949   NA 141 10/13/2016 1141   K 3.9 01/29/2019 0949   K 3.6 10/13/2016 1141   CL 104 01/29/2019 0949   CL 107 03/21/2012 1501   CO2 27 01/29/2019 0949   CO2 27 10/13/2016 1141   BUN 17 01/29/2019 0949   BUN 20.0 10/13/2016 1141   CREATININE 0.75 01/29/2019 0949   CREATININE 0.81 10/16/2018 1327   CREATININE 0.8 10/13/2016 1141      Component Value Date/Time   CALCIUM 9.2 01/29/2019 0949   CALCIUM 9.5 10/13/2016 1141   ALKPHOS 53 10/16/2018 1327   ALKPHOS 50 10/13/2016 1141   AST 19 10/16/2018 1327   AST 25 10/13/2016 1141   ALT 14 10/16/2018 1327   ALT 17 10/13/2016 1141   BILITOT 0.3 10/16/2018 1327   BILITOT 0.27 10/13/2016 1141       Lab Results  Component Value Date   WBC 7.6 10/16/2018   HGB 12.9 10/16/2018   HCT 39.7 10/16/2018   MCV 102.3 (H) 10/16/2018   PLT 209 10/16/2018    Lab Results  Component Value Date   TSH 3.44 01/29/2019     Total time spent on today's visit was ***30 minutes, including both face-to-face time and nonface-to-face time.  Time included that spent on review of records (prior notes available to me/labs/imaging if pertinent), discussing treatment and goals, answering patient's questions and coordinating care.  Cc:  Tonia Ghent, MD

## 2019-10-04 ENCOUNTER — Ambulatory Visit: Payer: Medicare Other | Admitting: Neurology

## 2019-10-04 IMAGING — CT CT HEAD WO/W CM
3 of 4 series · 15 of 47 positions shown, 18 images · IV contrast (OMNIPAQUE)
Comparison: Head CT 10/15/2015.

CLINICAL DATA: 78-year-old female with recent falls. Headache,
confusion and intermittent numbness. Previous history of breast
cancer and multiple myeloma.

EXAM:
CT HEAD WITHOUT AND WITH CONTRAST
TECHNIQUE: Contiguous axial images were obtained from the base of the skull
through the vertex without and with intravenous contrast
CONTRAST:  75mL OMNIPAQUE IOHEXOL 300 MG/ML  SOLN

[Series 2: head wo · axial · 0.47mm/px · z∈[-103,+22]mm · 9 of 31 slices shown, 12 images]
[im 3/31  brain]
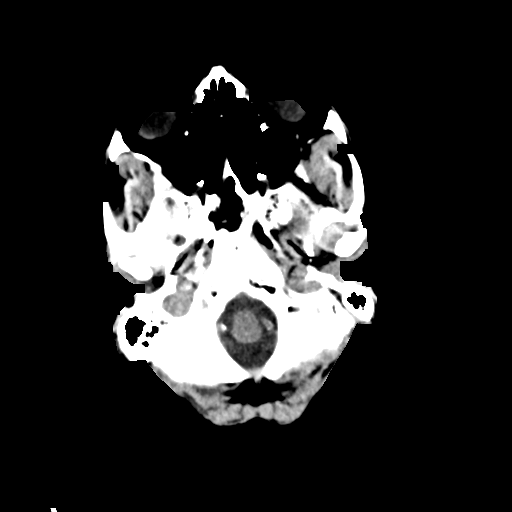
[im 3/31  bone]
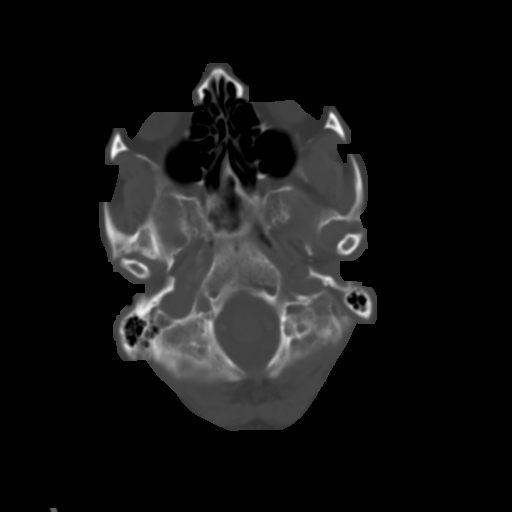
[im 7/31  brain]
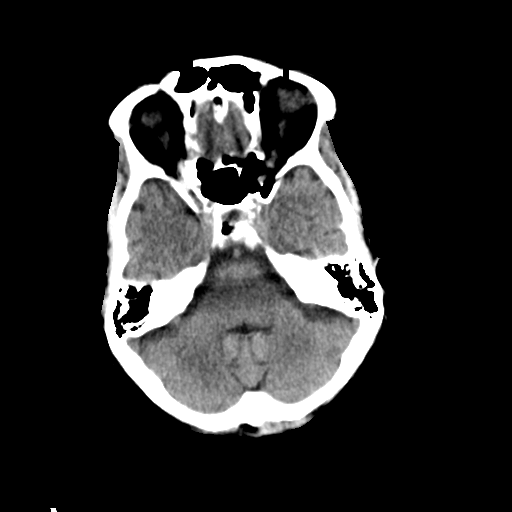
[im 9/31  brain]
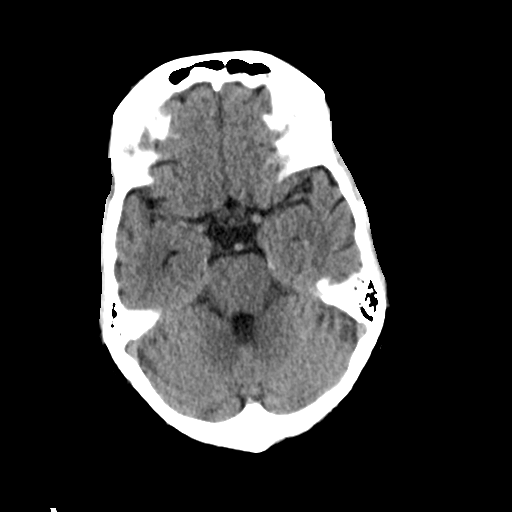
[im 13/31  brain]
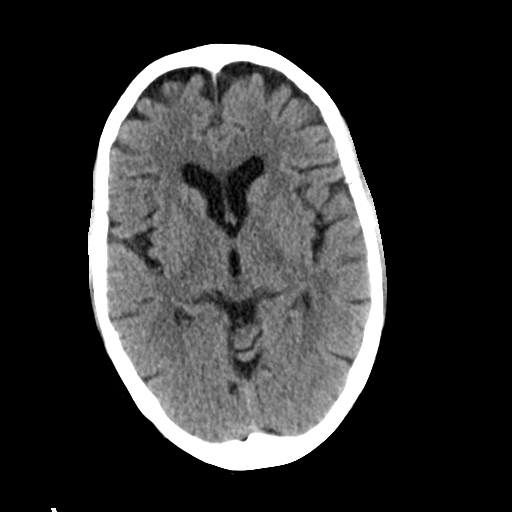
[im 16/31  brain]
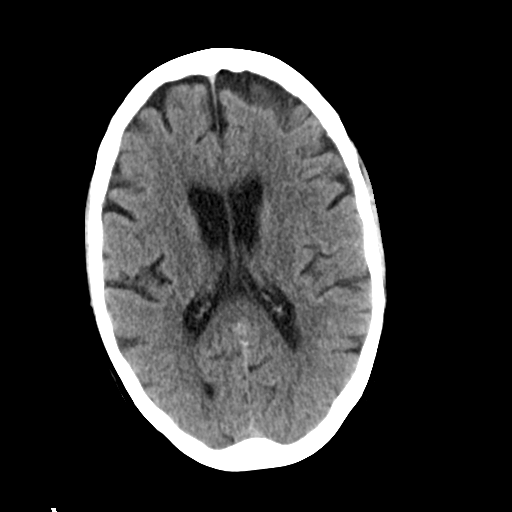
[im 16/31  bone]
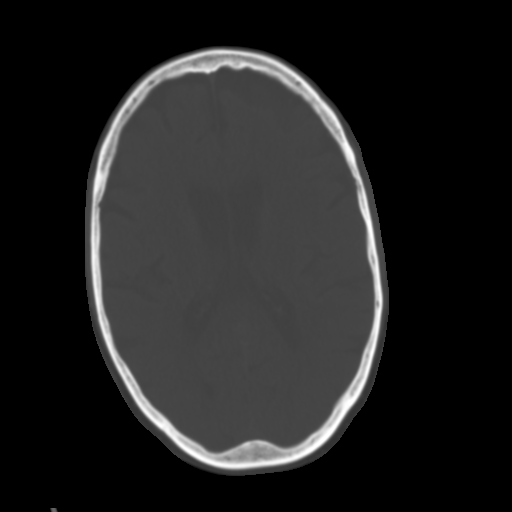
[im 18/31  brain]
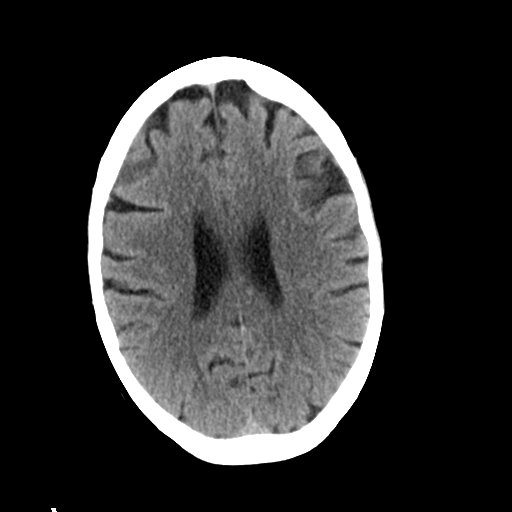
[im 22/31  brain]
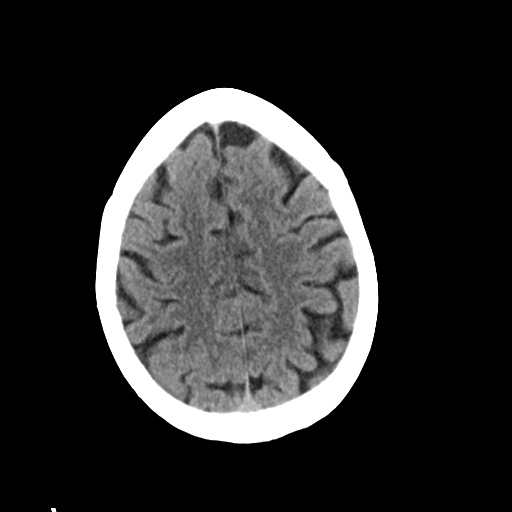
[im 24/31  brain]
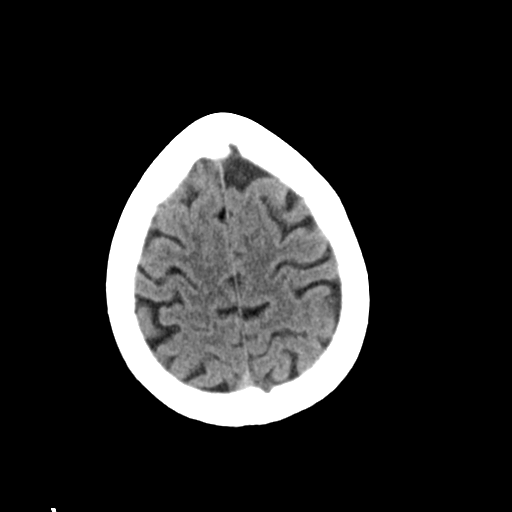
[im 28/31  brain]
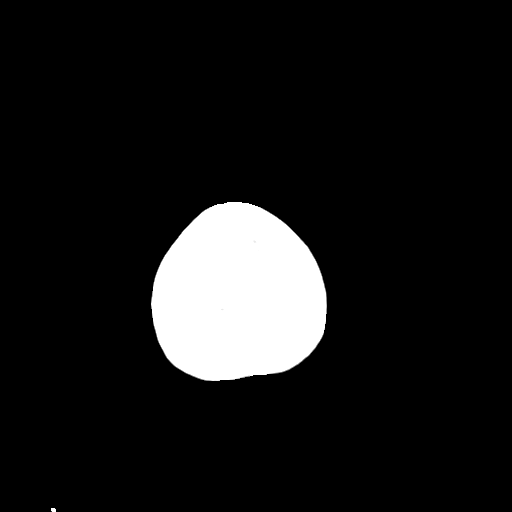
[im 28/31  bone]
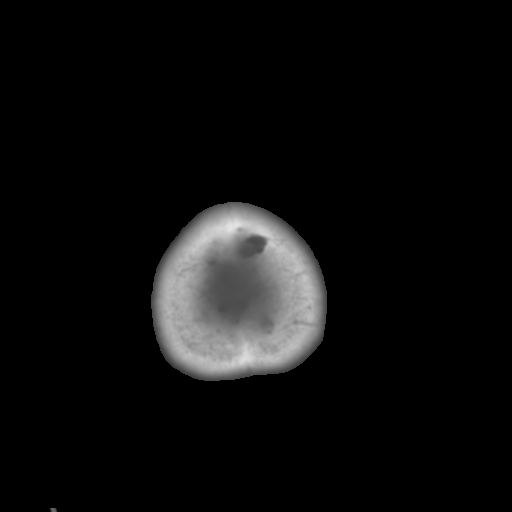

[Series 5: coronal soft tissue · coronal · 0.32mm/px · 3 of 72 slices shown]
[im 24/72  brain]
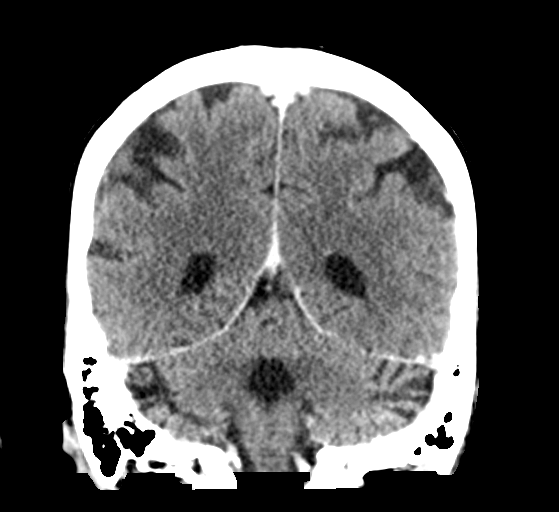
[im 32/72  brain]
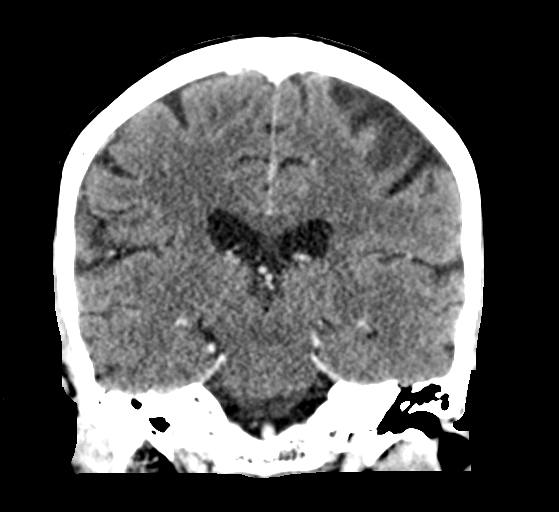
[im 40/72  brain]
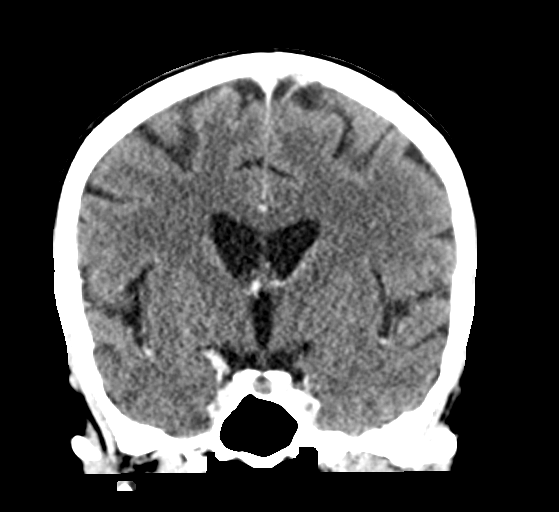

[Series 6: sagittal soft tissue · sagittal · 0.30mm/px · 3 of 54 slices shown]
[im 18/54  brain]
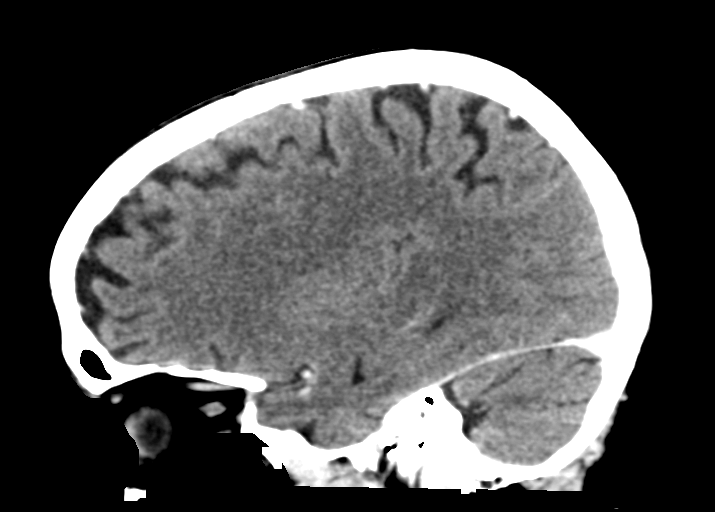
[im 27/54  brain]
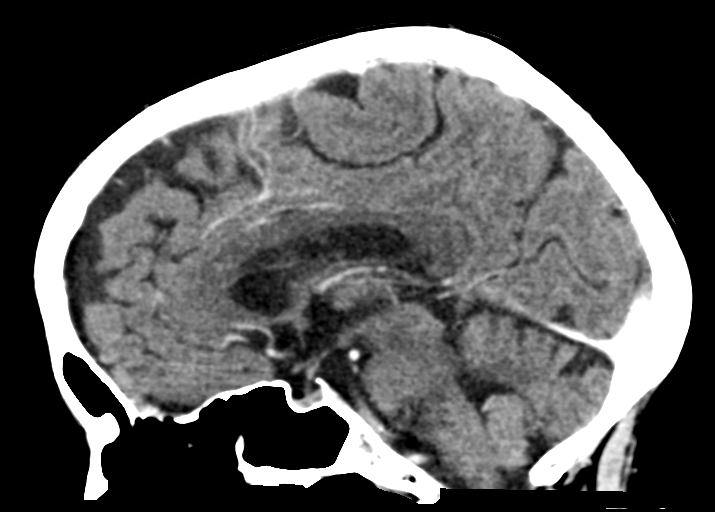
[im 36/54  brain]
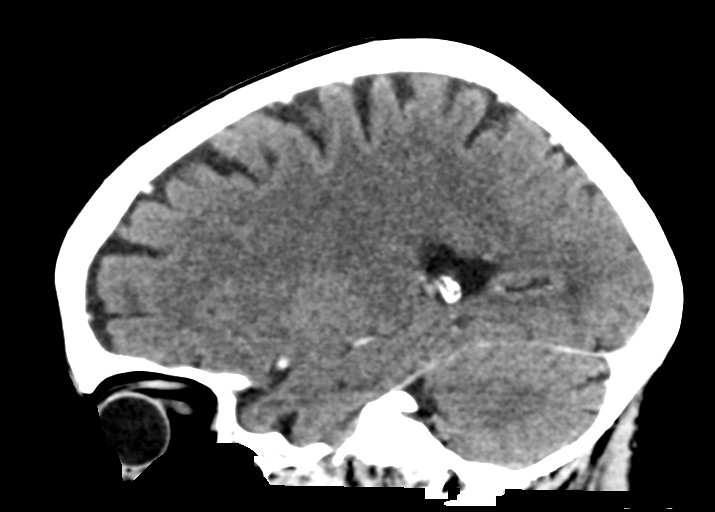

[15 of 47 positions shown; findings below may reference images not displayed]

FINDINGS: Brain: Cerebral volume remains normal for age. No midline shift,
ventriculomegaly, mass effect, evidence of mass lesion, intracranial
hemorrhage or evidence of cortically based acute infarction.
Gray-white matter differentiation is within normal limits throughout
the brain.

No abnormal enhancement identified.

Vascular: Calcified atherosclerosis at the skull base. The major
intracranial vascular structures are enhancing and appear to be
patent.

Skull: No acute osseous abnormality identified.

Sinuses/Orbits: Visualized paranasal sinuses and mastoids are stable
and well pneumatized.

Other: Interval postoperative changes to both globes. No acute orbit
or scalp soft tissue finding.
IMPRESSION: Negative, normal for age CT appearance of the brain.

## 2019-10-11 ENCOUNTER — Other Ambulatory Visit: Payer: Self-pay

## 2019-10-11 ENCOUNTER — Inpatient Hospital Stay: Payer: Medicare Other | Attending: Internal Medicine

## 2019-10-11 DIAGNOSIS — Z79899 Other long term (current) drug therapy: Secondary | ICD-10-CM | POA: Diagnosis not present

## 2019-10-11 DIAGNOSIS — I341 Nonrheumatic mitral (valve) prolapse: Secondary | ICD-10-CM | POA: Diagnosis not present

## 2019-10-11 DIAGNOSIS — Z853 Personal history of malignant neoplasm of breast: Secondary | ICD-10-CM | POA: Diagnosis not present

## 2019-10-11 DIAGNOSIS — D472 Monoclonal gammopathy: Secondary | ICD-10-CM | POA: Diagnosis not present

## 2019-10-11 DIAGNOSIS — Z9049 Acquired absence of other specified parts of digestive tract: Secondary | ICD-10-CM | POA: Insufficient documentation

## 2019-10-11 DIAGNOSIS — Z79810 Long term (current) use of selective estrogen receptor modulators (SERMs): Secondary | ICD-10-CM | POA: Diagnosis not present

## 2019-10-11 DIAGNOSIS — M6281 Muscle weakness (generalized): Secondary | ICD-10-CM | POA: Insufficient documentation

## 2019-10-11 DIAGNOSIS — Z90722 Acquired absence of ovaries, bilateral: Secondary | ICD-10-CM | POA: Insufficient documentation

## 2019-10-11 DIAGNOSIS — R5383 Other fatigue: Secondary | ICD-10-CM | POA: Diagnosis not present

## 2019-10-11 DIAGNOSIS — Z923 Personal history of irradiation: Secondary | ICD-10-CM | POA: Insufficient documentation

## 2019-10-11 DIAGNOSIS — M2550 Pain in unspecified joint: Secondary | ICD-10-CM | POA: Insufficient documentation

## 2019-10-11 DIAGNOSIS — Z880 Allergy status to penicillin: Secondary | ICD-10-CM | POA: Insufficient documentation

## 2019-10-11 DIAGNOSIS — Z85828 Personal history of other malignant neoplasm of skin: Secondary | ICD-10-CM | POA: Diagnosis not present

## 2019-10-11 DIAGNOSIS — G20 Parkinson's disease: Secondary | ICD-10-CM | POA: Diagnosis not present

## 2019-10-11 DIAGNOSIS — R531 Weakness: Secondary | ICD-10-CM | POA: Insufficient documentation

## 2019-10-11 DIAGNOSIS — Z8719 Personal history of other diseases of the digestive system: Secondary | ICD-10-CM | POA: Diagnosis not present

## 2019-10-11 LAB — CBC WITH DIFFERENTIAL (CANCER CENTER ONLY)
Abs Immature Granulocytes: 0.02 10*3/uL (ref 0.00–0.07)
Basophils Absolute: 0.1 10*3/uL (ref 0.0–0.1)
Basophils Relative: 1 %
Eosinophils Absolute: 0.4 10*3/uL (ref 0.0–0.5)
Eosinophils Relative: 5 %
HCT: 39.1 % (ref 36.0–46.0)
Hemoglobin: 12.9 g/dL (ref 12.0–15.0)
Immature Granulocytes: 0 %
Lymphocytes Relative: 29 %
Lymphs Abs: 2.2 10*3/uL (ref 0.7–4.0)
MCH: 33.8 pg (ref 26.0–34.0)
MCHC: 33 g/dL (ref 30.0–36.0)
MCV: 102.4 fL — ABNORMAL HIGH (ref 80.0–100.0)
Monocytes Absolute: 0.3 10*3/uL (ref 0.1–1.0)
Monocytes Relative: 4 %
Neutro Abs: 4.6 10*3/uL (ref 1.7–7.7)
Neutrophils Relative %: 61 %
Platelet Count: 198 10*3/uL (ref 150–400)
RBC: 3.82 MIL/uL — ABNORMAL LOW (ref 3.87–5.11)
RDW: 13.2 % (ref 11.5–15.5)
WBC Count: 7.6 10*3/uL (ref 4.0–10.5)
nRBC: 0 % (ref 0.0–0.2)

## 2019-10-11 LAB — CMP (CANCER CENTER ONLY)
ALT: 20 U/L (ref 0–44)
AST: 27 U/L (ref 15–41)
Albumin: 3.8 g/dL (ref 3.5–5.0)
Alkaline Phosphatase: 65 U/L (ref 38–126)
Anion gap: 8 (ref 5–15)
BUN: 13 mg/dL (ref 8–23)
CO2: 28 mmol/L (ref 22–32)
Calcium: 9.2 mg/dL (ref 8.9–10.3)
Chloride: 106 mmol/L (ref 98–111)
Creatinine: 0.81 mg/dL (ref 0.44–1.00)
GFR, Est AFR Am: >60 mL/min (ref >60)
GFR, Estimated: >60 mL/min (ref >60)
Glucose, Bld: 89 mg/dL (ref 70–99)
Potassium: 3.8 mmol/L (ref 3.5–5.1)
Sodium: 142 mmol/L (ref 135–145)
Total Bilirubin: 0.7 mg/dL (ref 0.3–1.2)
Total Protein: 7.5 g/dL (ref 6.5–8.1)

## 2019-10-11 LAB — LACTATE DEHYDROGENASE: LDH: 217 U/L — ABNORMAL HIGH (ref 98–192)

## 2019-10-12 LAB — IGG, IGA, IGM
IgA: 38 mg/dL — ABNORMAL LOW (ref 64–422)
IgG (Immunoglobin G), Serum: 1595 mg/dL (ref 586–1602)
IgM (Immunoglobulin M), Srm: 20 mg/dL — ABNORMAL LOW (ref 26–217)

## 2019-10-12 LAB — KAPPA/LAMBDA LIGHT CHAINS
Kappa free light chain: 59.4 mg/L — ABNORMAL HIGH (ref 3.3–19.4)
Kappa, lambda light chain ratio: 12.91 — ABNORMAL HIGH (ref 0.26–1.65)
Lambda free light chains: 4.6 mg/L — ABNORMAL LOW (ref 5.7–26.3)

## 2019-10-12 LAB — BETA 2 MICROGLOBULIN, SERUM: Beta-2 Microglobulin: 1.4 mg/L (ref 0.6–2.4)

## 2019-10-18 ENCOUNTER — Encounter: Payer: Self-pay | Admitting: Internal Medicine

## 2019-10-18 ENCOUNTER — Inpatient Hospital Stay (HOSPITAL_BASED_OUTPATIENT_CLINIC_OR_DEPARTMENT_OTHER): Payer: Medicare Other | Admitting: Internal Medicine

## 2019-10-18 ENCOUNTER — Telehealth: Payer: Self-pay | Admitting: Internal Medicine

## 2019-10-18 ENCOUNTER — Other Ambulatory Visit: Payer: Self-pay

## 2019-10-18 VITALS — BP 150/75 | HR 97 | Temp 97.5°F | Resp 18 | Ht 64.0 in | Wt 120.9 lb

## 2019-10-18 DIAGNOSIS — C50011 Malignant neoplasm of nipple and areola, right female breast: Secondary | ICD-10-CM | POA: Diagnosis not present

## 2019-10-18 DIAGNOSIS — R531 Weakness: Secondary | ICD-10-CM | POA: Diagnosis not present

## 2019-10-18 DIAGNOSIS — M2550 Pain in unspecified joint: Secondary | ICD-10-CM | POA: Diagnosis not present

## 2019-10-18 DIAGNOSIS — G20 Parkinson's disease: Secondary | ICD-10-CM | POA: Diagnosis not present

## 2019-10-18 DIAGNOSIS — D472 Monoclonal gammopathy: Secondary | ICD-10-CM | POA: Diagnosis not present

## 2019-10-18 DIAGNOSIS — R5383 Other fatigue: Secondary | ICD-10-CM | POA: Diagnosis not present

## 2019-10-18 DIAGNOSIS — Z853 Personal history of malignant neoplasm of breast: Secondary | ICD-10-CM | POA: Diagnosis not present

## 2019-10-18 MED ORDER — DENOSUMAB 60 MG/ML ~~LOC~~ SOSY
60.0000 mg | PREFILLED_SYRINGE | Freq: Once | SUBCUTANEOUS | Status: AC
  Administered 2019-10-18: 60 mg via SUBCUTANEOUS

## 2019-10-18 MED ORDER — DENOSUMAB 60 MG/ML ~~LOC~~ SOSY
PREFILLED_SYRINGE | SUBCUTANEOUS | Status: AC
  Filled 2019-10-18: qty 1

## 2019-10-18 NOTE — Telephone Encounter (Signed)
Scheduled appointments per 9/23 los. Gave patient calendar print out.  

## 2019-10-18 NOTE — Progress Notes (Signed)
[place] [phone]   [phone]  OFFICE PROGRESS NOTE  Tonia Ghent, MD [name] [address]  DIAGNOSIS:  1) Node-negative breast cancer status post completion of radiation 05/06/2010 currently on tamoxifen as well as prolia.  2) Monoclonal gammopathy of unknown significance on observation since 2007   PRIOR THERAPY:  1) status post right lumpectomy on 03/05/2010 and it showed invasive ductal carcinoma 0.9 CM, Positive for ER/PR and negative for HER-2, with ductal carcinoma in situ present and negative sentinel lymph node biopsies.  2) status post adjuvant radiotherapy completed in April of 2012   CURRENT THERAPY:  1) tamoxifen 20 mg by mouth daily started in 2012.  2) Prolia subcutaneous injection every 6 months.  INTERVAL HISTORY: [name] 79 y.o. female returns to the clinic today for 1 year follow-up visit.  The patient is feeling fine today with no concerning complaints except for frequent falls from generalized weakness.  She did not have any fractures.  She is currently on Prolia for her bone disease.  She denied having any current chest pain, shortness of breath, cough or hemoptysis.  She denied having any fever or chills.  She has no nausea, vomiting, diarrhea or constipation.  She has no headache or visual changes.  She is here today for evaluation and repeat myeloma panel.  MEDICAL HISTORY: Past Medical History:  Diagnosis Date  . Allergy    SEASONAL  . Anemia    Anemia-NOS / PMH, Dr Julien Nordmann  . Arthritis   . Breast cancer ([place])    stage I right breast  . Cataract    BILATERAL  . Decreased vision    R eye  . Esophageal Stricture   . Hyperlipidemia   . Monoclonal gammopathy   . MVP (mitral valve prolapse)   . Osteoporosis   . Parkinson disease ([place])   . Skin cancer    basal cell L neck  . Vertical diplopia     ALLERGIES:  is allergic to penicillins.  MEDICATIONS:  Current  Outpatient Medications  Medication Sig Dispense Refill  . amantadine (SYMMETREL) 100 MG capsule TAKE (1) CAPSULE BY MOUTH THREE TIMES DAILY 90 capsule 0  . aspirin 81 MG tablet Take 81 mg by mouth daily.      [name] b complex vitamins tablet Take 1 tablet by mouth daily.    . Calcium 1500 MG tablet Take 1,500 mg by mouth.      . carbidopa-levodopa (SINEMET IR) 25-100 MG tablet TAKE 1 & 1/2 TABLETS BY MOUTH THREE TIMES DAILY 405 tablet 0  . Cholecalciferol 5000 units TABS Take 1 tablet (5,000 Units total) by mouth daily.    . clonazePAM (KLONOPIN) 0.5 MG tablet TAKE 1/2 (ONE-HALF) TABLET BY MOUTH AT BEDTIME 45 tablet 0  . Denosumab (PROLIA [place]) Inject into the skin every 6 (six) months.    . Elastic Bandages & Supports (ABDOMINAL BINDER/ELASTIC SMALL) MISC 1 Device by Does not apply route daily. 1 each 0  . Ferrous Sulfate (IRON) 325 (65 Fe) MG TABS Take 325 mg by mouth.    . Multiple Vitamin (MULTIVITAMIN) capsule 2 tabs po qd     . Omega-3 Fatty Acids (OMEGA 3 PO) 1 tab po qd     . omeprazole (PRILOSEC) 20 MG capsule TAKE (1) CAPSULE BY MOUTH ONCE DAILY. 90 capsule 3  . OnabotulinumtoxinA (BOTOX IM) Inject into the muscle every 3 (three) months. Injection every 3 months    .  tamoxifen (NOLVADEX) 20 MG tablet Take 1 tablet (20 mg total) by mouth daily. 90 tablet 3  . traMADol (ULTRAM) 50 MG tablet TAKE (1) TABLET BY MOUTH EVERY TWELVE HOURS AS NEEDED FOR PAIN. 60 tablet 1  . venlafaxine XR (EFFEXOR-XR) 75 MG 24 hr capsule TAKE 1 CAPSULE BY MOUTH ONCE DAILY WITH BREAKFAST 90 capsule 0  . vitamin B-12 (CYANOCOBALAMIN) 500 MCG tablet Take 2500 mcg daily.     Current Facility-Administered Medications  Medication Dose Route Frequency Provider Last Rate Last Admin  . incobotulinumtoxinA (XEOMIN) 100 units injection 100 Units  100 Units Intramuscular Q90 days Tat, Eustace Quail, DO   100 Units at 09/07/19 1128    SURGICAL HISTORY:  Past Surgical History:  Procedure Laterality Date  . APPENDECTOMY  2002   . BONE BIOPSY  11/06/10  . BREAST LUMPECTOMY  02/2010   right breast lumpectomy and sentinel node biopsy  . CARDIAC CATHETERIZATION  2005   neg  . CATARACT EXTRACTION, BILATERAL  2018  . COLONOSCOPY     negative   . HEMORRHOID SURGERY  1970's  . SALPINGOOPHORECTOMY  2002   for benign growths  . SHOULDER SURGERY Right   . TOTAL ABDOMINAL HYSTERECTOMY W/ BILATERAL SALPINGOOPHORECTOMY  79 yrs old   for pain,prolapse ; G3 P2  . UPPER GASTROINTESTINAL ENDOSCOPY      REVIEW OF SYSTEMS:  A comprehensive review of systems was negative except for: Constitutional: positive for fatigue Musculoskeletal: positive for arthralgias and muscle weakness   PHYSICAL EXAMINATION: General appearance: alert, cooperative, fatigued and no distress Head: Normocephalic, without obvious abnormality, atraumatic Neck: no adenopathy, no JVD, supple, symmetrical, trachea midline and thyroid not enlarged, symmetric, no tenderness/mass/nodules Lymph nodes: Cervical, supraclavicular, and axillary nodes normal. Resp: clear to auscultation bilaterally Back: symmetric, no curvature. ROM normal. No CVA tenderness. Cardio: regular rate and rhythm, S1, S2 normal, no murmur, click, rub or gallop GI: soft, non-tender; bowel sounds normal; no masses,  no organomegaly Extremities: extremities normal, atraumatic, no cyanosis or edema  ECOG PERFORMANCE STATUS: 1 - Symptomatic but completely ambulatory  Blood pressure (!) 150/75, pulse 97, temperature (!) 97.5 F (36.4 C), temperature source Tympanic, resp. rate 18, height 5' 4" (1.626 m), weight 120 lb 14.4 oz (54.8 kg), SpO2 100 %.  LABORATORY DATA: Lab Results  Component Value Date   WBC 7.6 10/11/2019   HGB 12.9 10/11/2019   HCT 39.1 10/11/2019   MCV 102.4 (H) 10/11/2019   PLT 198 10/11/2019      Chemistry      Component Value Date/Time   NA 142 10/11/2019 1013   NA 141 10/13/2016 1141   K 3.8 10/11/2019 1013   K 3.6 10/13/2016 1141   CL 106 10/11/2019 1013    CL 107 03/21/2012 1501   CO2 28 10/11/2019 1013   CO2 27 10/13/2016 1141   BUN 13 10/11/2019 1013   BUN 20.0 10/13/2016 1141   CREATININE 0.81 10/11/2019 1013   CREATININE 0.8 10/13/2016 1141      Component Value Date/Time   CALCIUM 9.2 10/11/2019 1013   CALCIUM 9.5 10/13/2016 1141   ALKPHOS 65 10/11/2019 1013   ALKPHOS 50 10/13/2016 1141   AST 27 10/11/2019 1013   AST 25 10/13/2016 1141   ALT 20 10/11/2019 1013   ALT 17 10/13/2016 1141   BILITOT 0.7 10/11/2019 1013   BILITOT 0.27 10/13/2016 1141       RADIOGRAPHIC STUDIES: No results found.  ASSESSMENT AND PLAN:  This is a very pleasant  79 years old white female with history of breast adenocarcinoma currently on tamoxifen as well as history of monoclonal gammopathy of undetermined significance currently on observation. The patient had repeat myeloma panel performed recently that showed no concerning findings for progression. I recommended for her to continue on observation with repeat myeloma panel in 1 year. For the history of breast cancer, she will continue her current treatment with tamoxifen. For the bone disease, she will continue her treatment with Prolia every 6 months. She was advised to call immediately if she has any other concerning symptoms in the interval. The patient voices understanding of current disease status and treatment options and is in agreement with the current care plan. All questions were answered. The patient knows to call the clinic with any problems, questions or concerns. We can certainly see the patient much sooner if necessary.   Disclaimer: This note was dictated with voice recognition software. Similar sounding words can inadvertently be transcribed and may not be corrected upon review.

## 2019-10-22 NOTE — Progress Notes (Signed)
Assessment/Plan:   1.   Parkinsons Disease  -change carbidopa/levodopa 25/100 from 1.5 tid to 1 po at 7am/11am/3pm/7pm (slight dose decrease but spreading out dose to see if helps dyskinesia as I stop amantadine)  -stop amantadine due to hallucinations  -consider nuplazid if hallucinations persist  -[facility] at [facility] 2.  Sialorrhea  -continue xeomin injections  3.  Lower extremity paresthesias, with history of MGUS  -Could be peripheral neuropathy, could be restless leg.  Do B12, TSH, CBC, ferritin, iron (actually all already ordered by PCP - will have them collected here today)  4.  LBP since fall  -limit tramadol due to hallucinations.  Discussed with the patient that this medication can definitely cause/contribute to hallucinations in Parkinsons Disease patients  5.  Will see her in next 4 weeks due to worsening of s/s  Subjective:   [name] was seen today in follow up for Parkinsons disease.  My previous records were reviewed prior to todays visit as well as outside records available to me. Pt has had several falls since our last visit.  She has had 3 falls since 09/07/19.  None of the falls she was on the walker.  With the last fall, she was having a hallucination of bugs and looked down to see the "spiders" and fell and hit the bathtub.  She was in the bathroom and didn't have the walker.  Lots of tail bone pain since fall.  On tramadol. Saw primary care and he did give her a prescription for a Rollator.  Pt denies lightheadedness, near syncope.   Mood has been good.  Xeomin is working well.  Last injections were on 8/13 for sialorrhea.  Received email from primary care on May 13 asking me about a "crawling sensation" in the patient's lower extremities.  Patient does have a history of MGUS.  Current prescribed movement disorder medications: Carbidopa/levodopa 25/100, 1.5 tablets 3 times per day (9am/1pm/6pm per pt but she states that she is waking up at  5am). Amantadine, 100 mg, 1 tablet 3 times per day     ALLERGIES:   Allergies  Allergen Reactions  . Penicillins Shortness Of Breath and Itching    CURRENT MEDICATIONS:  Outpatient Encounter Medications as of 10/23/2019  Medication Sig  . amantadine (SYMMETREL) 100 MG capsule TAKE (1) CAPSULE BY MOUTH THREE TIMES DAILY  . aspirin 81 MG tablet Take 81 mg by mouth daily.    [name] b complex vitamins tablet Take 1 tablet by mouth daily.  . Calcium 1500 MG tablet Take 1,500 mg by mouth.    . carbidopa-levodopa (SINEMET IR) 25-100 MG tablet TAKE 1 & 1/2 TABLETS BY MOUTH THREE TIMES DAILY  . Cholecalciferol 5000 units TABS Take 1 tablet (5,000 Units total) by mouth daily.  . clonazePAM (KLONOPIN) 0.5 MG tablet TAKE 1/2 (ONE-HALF) TABLET BY MOUTH AT BEDTIME  . Denosumab (PROLIA [place]) Inject into the skin every 6 (six) months.  . Elastic Bandages & Supports (ABDOMINAL BINDER/ELASTIC SMALL) MISC 1 Device by Does not apply route daily.  . Ferrous Sulfate (IRON) 325 (65 Fe) MG TABS Take 325 mg by mouth.  . Multiple Vitamin (MULTIVITAMIN) capsule 2 tabs po qd   . Omega-3 Fatty Acids (OMEGA 3 PO) 1 tab po qd   . omeprazole (PRILOSEC) 20 MG capsule TAKE (1) CAPSULE BY MOUTH ONCE DAILY.  . OnabotulinumtoxinA (BOTOX IM) Inject into the muscle every 3 (three) months. Injection every 3 months  . tamoxifen (NOLVADEX) 20 MG  tablet Take 1 tablet (20 mg total) by mouth daily.  . traMADol (ULTRAM) 50 MG tablet TAKE (1) TABLET BY MOUTH EVERY TWELVE HOURS AS NEEDED FOR PAIN.  [name] venlafaxine XR (EFFEXOR-XR) 75 MG 24 hr capsule TAKE 1 CAPSULE BY MOUTH ONCE DAILY WITH BREAKFAST  . vitamin B-12 (CYANOCOBALAMIN) 500 MCG tablet Take 2500 mcg daily.   Facility-Administered Encounter Medications as of 10/23/2019  Medication  . incobotulinumtoxinA (XEOMIN) 100 units injection 100 Units    Objective:   PHYSICAL EXAMINATION:    VITALS:   Vitals:   10/23/19 1357  BP: 139/85  Pulse: 91  SpO2: 96%  Weight: 119 lb  (54 kg)  Height: 5\' 4"  (1.626 m)    GEN:  The patient appears stated age and is in NAD. HEENT:  Normocephalic, atraumatic.  The mucous membranes are moist. The superficial temporal arteries are without ropiness or tenderness. CV:  RRR Lungs:  CTAB Neck/HEME:  There are no carotid bruits bilaterally.  Neurological examination:  Orientation: The patient is alert and oriented x3. Cranial nerves: There is good facial symmetry with facial hypomimia. The speech is fluent and clear. Soft palate rises symmetrically and there is no tongue deviation. Hearing is intact to conversational tone. Sensation: Sensation is intact to light touch throughout Motor: Strength is at least antigravity x4.  Movement examination: Tone: There is normal tone in the UE/LE Abnormal movements: she has mod dyskinesia in the legs and axial regions Coordination:  There is  decremation with RAM's, esp of the L foot Gait and Station: The patient has difficulty arising out of a deep-seated chair without the use of the hands.  Patient pushes off of the chair to arise.  The patient's stride length is decreased but stride length is good.   I have reviewed and interpreted the following labs independently    Chemistry      Component Value Date/Time   NA 142 10/11/2019 1013   NA 141 10/13/2016 1141   K 3.8 10/11/2019 1013   K 3.6 10/13/2016 1141   CL 106 10/11/2019 1013   CL 107 03/21/2012 1501   CO2 28 10/11/2019 1013   CO2 27 10/13/2016 1141   BUN 13 10/11/2019 1013   BUN 20.0 10/13/2016 1141   CREATININE 0.81 10/11/2019 1013   CREATININE 0.8 10/13/2016 1141      Component Value Date/Time   CALCIUM 9.2 10/11/2019 1013   CALCIUM 9.5 10/13/2016 1141   ALKPHOS 65 10/11/2019 1013   ALKPHOS 50 10/13/2016 1141   AST 27 10/11/2019 1013   AST 25 10/13/2016 1141   ALT 20 10/11/2019 1013   ALT 17 10/13/2016 1141   BILITOT 0.7 10/11/2019 1013   BILITOT 0.27 10/13/2016 1141       Lab Results  Component Value Date    WBC 7.6 10/11/2019   HGB 12.9 10/11/2019   HCT 39.1 10/11/2019   MCV 102.4 (H) 10/11/2019   PLT 198 10/11/2019    Lab Results  Component Value Date   TSH 3.44 01/29/2019     Total time spent on today's visit was 40 minutes, including both face-to-face time and nonface-to-face time.  Time included that spent on review of records (prior notes available to me/labs/imaging if pertinent), discussing treatment and goals, answering patient's questions and coordinating care.  Cc:  Tonia Ghent, MD

## 2019-10-23 ENCOUNTER — Other Ambulatory Visit: Payer: Self-pay

## 2019-10-23 ENCOUNTER — Encounter: Payer: Self-pay | Admitting: Neurology

## 2019-10-23 ENCOUNTER — Other Ambulatory Visit: Payer: Medicare Other

## 2019-10-23 ENCOUNTER — Ambulatory Visit (INDEPENDENT_AMBULATORY_CARE_PROVIDER_SITE_OTHER): Payer: Medicare Other | Admitting: Neurology

## 2019-10-23 VITALS — BP 139/85 | HR 91 | Ht 64.0 in | Wt 119.0 lb

## 2019-10-23 DIAGNOSIS — R441 Visual hallucinations: Secondary | ICD-10-CM | POA: Diagnosis not present

## 2019-10-23 DIAGNOSIS — D539 Nutritional anemia, unspecified: Secondary | ICD-10-CM

## 2019-10-23 DIAGNOSIS — G20 Parkinson's disease: Secondary | ICD-10-CM | POA: Diagnosis not present

## 2019-10-23 DIAGNOSIS — G249 Dystonia, unspecified: Secondary | ICD-10-CM | POA: Diagnosis not present

## 2019-10-23 DIAGNOSIS — M810 Age-related osteoporosis without current pathological fracture: Secondary | ICD-10-CM

## 2019-10-23 DIAGNOSIS — D7589 Other specified diseases of blood and blood-forming organs: Secondary | ICD-10-CM

## 2019-10-23 DIAGNOSIS — D649 Anemia, unspecified: Secondary | ICD-10-CM

## 2019-10-23 MED ORDER — CARBIDOPA-LEVODOPA 25-100 MG PO TABS
1.0000 | ORAL_TABLET | Freq: Four times a day (QID) | ORAL | 1 refills | Status: DC
Start: 2019-10-23 — End: 2019-11-21

## 2019-10-23 NOTE — Patient Instructions (Addendum)
1.  change carbidopa/levodopa 25/100 from 1.5 three times per day to 1 po at 7am/11am/3pm/7pm 2.  STOP amantadine 3.  LIMIT tramadol - this can cause hallucinations - I want you to try to use tylenol for your pain instead 4.  We will send referral to [facility]  Your provider has requested that you have labwork completed today. Please go to [facility] ([id number]) on the second floor of this building before leaving the office today. You do not need to check in. If you are not called within 15 minutes please check with the front desk.

## 2019-10-24 ENCOUNTER — Telehealth: Payer: Self-pay | Admitting: Radiology

## 2019-10-24 NOTE — Telephone Encounter (Signed)
Per Dr [name], if she had blood work done on 9.28.21 @ her neuro she doesn't need to come for her lab appt her on 9.30.21.

## 2019-10-25 ENCOUNTER — Other Ambulatory Visit: Payer: Self-pay

## 2019-10-25 ENCOUNTER — Other Ambulatory Visit (INDEPENDENT_AMBULATORY_CARE_PROVIDER_SITE_OTHER): Payer: Medicare Other

## 2019-10-25 DIAGNOSIS — R7989 Other specified abnormal findings of blood chemistry: Secondary | ICD-10-CM

## 2019-10-25 DIAGNOSIS — D539 Nutritional anemia, unspecified: Secondary | ICD-10-CM | POA: Diagnosis not present

## 2019-10-25 DIAGNOSIS — M810 Age-related osteoporosis without current pathological fracture: Secondary | ICD-10-CM | POA: Diagnosis not present

## 2019-10-25 DIAGNOSIS — D649 Anemia, unspecified: Secondary | ICD-10-CM | POA: Diagnosis not present

## 2019-10-25 DIAGNOSIS — D7589 Other specified diseases of blood and blood-forming organs: Secondary | ICD-10-CM | POA: Diagnosis not present

## 2019-10-25 DIAGNOSIS — G20 Parkinson's disease: Secondary | ICD-10-CM

## 2019-10-25 LAB — VITAMIN D 25 HYDROXY (VIT D DEFICIENCY, FRACTURES): VITD: 33.46 ng/mL (ref 30.00–100.00)

## 2019-10-25 LAB — COMPREHENSIVE METABOLIC PANEL
ALT: 13 U/L (ref 0–35)
AST: 22 U/L (ref 0–37)
Albumin: 3.9 g/dL (ref 3.5–5.2)
Alkaline Phosphatase: 60 U/L (ref 39–117)
BUN: 13 mg/dL (ref 6–23)
CO2: 27 mEq/L (ref 19–32)
Calcium: 9 mg/dL (ref 8.4–10.5)
Chloride: 106 mEq/L (ref 96–112)
Creatinine, Ser: 0.73 mg/dL (ref 0.40–1.20)
GFR: 77.09 mL/min (ref >60.00)
Glucose, Bld: 105 mg/dL — ABNORMAL HIGH (ref 70–99)
Potassium: 4.2 mEq/L (ref 3.5–5.1)
Sodium: 141 mEq/L (ref 135–145)
Total Bilirubin: 0.5 mg/dL (ref 0.2–1.2)
Total Protein: 6.9 g/dL (ref 6.0–8.3)

## 2019-10-25 LAB — CBC WITH DIFFERENTIAL/PLATELET
Basophils Absolute: 0 10*3/uL (ref 0.0–0.1)
Basophils Relative: 0.7 % (ref 0.0–3.0)
Eosinophils Absolute: 0.3 10*3/uL (ref 0.0–0.7)
Eosinophils Relative: 5.5 % — ABNORMAL HIGH (ref 0.0–5.0)
HCT: 38.8 % (ref 36.0–46.0)
Hemoglobin: 12.9 g/dL (ref 12.0–15.0)
Lymphocytes Relative: 32.9 % (ref 12.0–46.0)
Lymphs Abs: 2.1 10*3/uL (ref 0.7–4.0)
MCHC: 33.2 g/dL (ref 30.0–36.0)
MCV: 102.4 fl — ABNORMAL HIGH (ref 78.0–100.0)
Monocytes Absolute: 0.3 10*3/uL (ref 0.1–1.0)
Monocytes Relative: 5.2 % (ref 3.0–12.0)
Neutro Abs: 3.6 10*3/uL (ref 1.4–7.7)
Neutrophils Relative %: 55.7 % (ref 43.0–77.0)
Platelets: 258 10*3/uL (ref 150.0–400.0)
RBC: 3.78 Mil/uL — ABNORMAL LOW (ref 3.87–5.11)
RDW: 13.6 % (ref 11.5–15.5)
WBC: 6.4 10*3/uL (ref 4.0–10.5)

## 2019-10-25 LAB — IBC + FERRITIN
Ferritin: 78.8 ng/mL (ref 10.0–291.0)
Iron: 111 ug/dL (ref 42–145)
Saturation Ratios: 32.9 % (ref 20.0–50.0)
Transferrin: 241 mg/dL (ref 212.0–360.0)

## 2019-10-25 LAB — TSH: TSH: 4.75 u[IU]/mL — ABNORMAL HIGH (ref 0.35–4.50)

## 2019-10-25 LAB — VITAMIN B12: Vitamin B-12: 1277 pg/mL — ABNORMAL HIGH (ref 211–911)

## 2019-11-15 DIAGNOSIS — M6281 Muscle weakness (generalized): Secondary | ICD-10-CM | POA: Diagnosis not present

## 2019-11-15 DIAGNOSIS — G20 Parkinson's disease: Secondary | ICD-10-CM | POA: Diagnosis not present

## 2019-11-19 DIAGNOSIS — Z23 Encounter for immunization: Secondary | ICD-10-CM | POA: Diagnosis not present

## 2019-11-19 NOTE — Progress Notes (Signed)
Assessment/Plan:   1.   Parkinsons Disease  -change carbidopa/levodopa 25/100, 1 tablet at 7 AM/ 1/2 tablet at 9, 1/2 tablet at 11 AM/1/2 tablet at 1pm/1/2 tablet at 3 PM/1/2 tablet at 5pm/ 1/2 tablet at 7 PM  -has very significant dyskinesia but amantadine caused hallucinations  -multiple falls - to start PT on Monday at [facility]  -discussed DBS in detail including risks and benefits.  i'm concerned about her support system.  She lives alone.  Also, she may have some MCI given the hallucinations with amantadine and would need neurocog testing.  Pt literature on dbs given  -told her to use walker at all times  2.  Sialorrhea  -continue xeomin injections  3.  LBP since fall  -limit tramadol due to hallucinations.  Discussed with the patient that this medication can definitely cause/contribute to hallucinations in Parkinsons Disease patients  4.  rbd  -on klonopin, 0.5 mg, 1/2 po q hs.  Pt has not refilled per PDMP since august and that was only 15 pills  Subjective:   [name] was seen today in follow up for Parkinsons disease.  My previous records were reviewed prior to todays visit as well as outside records available to me.  Last visit, I stopped the patient's amantadine because of hallucinations and changed the way that she dosed her levodopa.  [facility] also [facility] to [facility].  I also told her to limit her tramadol, as that certainly could promote hallucinations in Parkinson's patients.  In regards to hallucinations, she reports that she no longer has those but she has significant "wiggles" and cannot stop moving.  She [place] one [place] at the mall and had 2 other falls since our last visit.    Current prescribed movement disorder medications: Carbidopa/levodopa 25/100, 1. tablet at 7 AM/11 AM/3 PM/7 PM Amantadine discontinued last visit because of hallucinations ? Klonopin, 0.5 mg, 1/2 po q hs (not filling regularly)    ALLERGIES:   Allergies   Allergen Reactions  . Penicillins Shortness Of Breath and Itching    CURRENT MEDICATIONS:  Outpatient Encounter Medications as of 11/21/2019  Medication Sig  . aspirin 81 MG tablet Take 81 mg by mouth daily.    [name] b complex vitamins tablet Take 1 tablet by mouth daily.  . Calcium 1500 MG tablet Take 1,500 mg by mouth.    . carbidopa-levodopa (SINEMET IR) 25-100 MG tablet Take 1 tablet by mouth 4 (four) times daily. 1 at 7am/11am/3pm/7pm  . Cholecalciferol 5000 units TABS Take 1 tablet (5,000 Units total) by mouth daily.  . clonazePAM (KLONOPIN) 0.5 MG tablet TAKE 1/2 (ONE-HALF) TABLET BY MOUTH AT BEDTIME  . Denosumab (PROLIA [place]) Inject into the skin every 6 (six) months.  . Elastic Bandages & Supports (ABDOMINAL BINDER/ELASTIC SMALL) MISC 1 Device by Does not apply route daily.  . Ferrous Sulfate (IRON) 325 (65 Fe) MG TABS Take 325 mg by mouth.  . Multiple Vitamin (MULTIVITAMIN) capsule 2 tabs po qd   . Omega-3 Fatty Acids (OMEGA 3 PO) 1 tab po qd   . omeprazole (PRILOSEC) 20 MG capsule TAKE (1) CAPSULE BY MOUTH ONCE DAILY.  . OnabotulinumtoxinA (BOTOX IM) Inject into the muscle every 3 (three) months. Injection every 3 months  . tamoxifen (NOLVADEX) 20 MG tablet Take 1 tablet (20 mg total) by mouth daily.  . traMADol (ULTRAM) 50 MG tablet TAKE (1) TABLET BY MOUTH EVERY TWELVE HOURS AS NEEDED FOR PAIN.  [name] venlafaxine XR (  EFFEXOR-XR) 75 MG 24 hr capsule TAKE 1 CAPSULE BY MOUTH ONCE DAILY WITH BREAKFAST  . vitamin B-12 (CYANOCOBALAMIN) 500 MCG tablet Take 2500 mcg daily.   Facility-Administered Encounter Medications as of 11/21/2019  Medication  . incobotulinumtoxinA (XEOMIN) 100 units injection 100 Units    Objective:   PHYSICAL EXAMINATION:    VITALS:   Vitals:   11/21/19 1040  BP: 110/64  Pulse: (!) 59  SpO2: 100%  Weight: 123 lb (55.8 kg)  Height: 5\' 4"  (1.626 m)    GEN:  The patient appears stated age and is in NAD. HEENT:  Normocephalic, atraumatic.  The mucous  membranes are moist. The superficial temporal arteries are without ropiness or tenderness. CV:  RRR Lungs:  CTAB Neck/HEME:  There are no carotid bruits bilaterally.  Neurological examination:  Orientation: The patient is alert and oriented x3. Cranial nerves: There is good facial symmetry with facial hypomimia. The speech is fluent and clear. Soft palate rises symmetrically and there is no tongue deviation. Hearing is intact to conversational tone. Sensation: Sensation is intact to light touch throughout Motor: Strength is at least antigravity x4.  Movement examination: Tone: There is normal tone in the UE/LE Abnormal movements: she has mod dyskinesia in the L leg and arm and axial regions Coordination:  There is mild decremation with RAM's, esp of the L foot Gait and Station: The patient has difficulty arising out of a deep-seated chair without the use of the hands.  Patient pushes off of the chair to arise.  The patient's stride length is decreased but stride length is good.   I have reviewed and interpreted the following labs independently    Chemistry      Component Value Date/Time   NA 141 10/25/2019 0922   NA 141 10/13/2016 1141   K 4.2 10/25/2019 0922   K 3.6 10/13/2016 1141   CL 106 10/25/2019 0922   CL 107 03/21/2012 1501   CO2 27 10/25/2019 0922   CO2 27 10/13/2016 1141   BUN 13 10/25/2019 0922   BUN 20.0 10/13/2016 1141   CREATININE 0.73 10/25/2019 0922   CREATININE 0.81 10/11/2019 1013   CREATININE 0.8 10/13/2016 1141      Component Value Date/Time   CALCIUM 9.0 10/25/2019 0922   CALCIUM 9.5 10/13/2016 1141   ALKPHOS 60 10/25/2019 0922   ALKPHOS 50 10/13/2016 1141   AST 22 10/25/2019 0922   AST 27 10/11/2019 1013   AST 25 10/13/2016 1141   ALT 13 10/25/2019 0922   ALT 20 10/11/2019 1013   ALT 17 10/13/2016 1141   BILITOT 0.5 10/25/2019 0922   BILITOT 0.7 10/11/2019 1013   BILITOT 0.27 10/13/2016 1141       Lab Results  Component Value Date   WBC  6.4 10/25/2019   HGB 12.9 10/25/2019   HCT 38.8 10/25/2019   MCV 102.4 (H) 10/25/2019   PLT 258.0 10/25/2019    Lab Results  Component Value Date   TSH 4.75 (H) 10/25/2019   Lab Results  Component Value Date   VITAMINB12 1,277 (H) 10/25/2019   Lab Results  Component Value Date   IRON 111 10/25/2019   FERRITIN 78.8 10/25/2019     Total time spent on today's visit was 45 minutes, including both face-to-face time and nonface-to-face time.  Time included that spent on review of records (prior notes available to me/labs/imaging if pertinent), discussing treatment and goals, answering patient's questions and coordinating care.  Cc:  Tonia Ghent, MD

## 2019-11-21 ENCOUNTER — Ambulatory Visit (INDEPENDENT_AMBULATORY_CARE_PROVIDER_SITE_OTHER): Payer: Medicare Other | Admitting: Neurology

## 2019-11-21 ENCOUNTER — Encounter: Payer: Self-pay | Admitting: Neurology

## 2019-11-21 ENCOUNTER — Other Ambulatory Visit: Payer: Self-pay

## 2019-11-21 VITALS — BP 110/64 | HR 59 | Ht 64.0 in | Wt 123.0 lb

## 2019-11-21 DIAGNOSIS — G249 Dystonia, unspecified: Secondary | ICD-10-CM | POA: Diagnosis not present

## 2019-11-21 DIAGNOSIS — G20 Parkinson's disease: Secondary | ICD-10-CM | POA: Diagnosis not present

## 2019-11-21 MED ORDER — CARBIDOPA-LEVODOPA 25-100 MG PO TABS
ORAL_TABLET | ORAL | 1 refills | Status: DC
Start: 2019-11-21 — End: 2020-06-19

## 2019-11-21 NOTE — Patient Instructions (Addendum)
1.  Take carbidopa/levodopa 25/100, 1 tablet at 7 AM/ 1/2 tablet at 9, 1/2 tablet at 11 AM/1/2 tablet at 1pm/1/2 tablet at 3 PM/1/2 tablet at 5pm/ 1/2 tablet at 7 PM  2.  We may consider surgery in the future  3.  I want you to use your walker!

## 2019-11-26 DIAGNOSIS — M6281 Muscle weakness (generalized): Secondary | ICD-10-CM | POA: Diagnosis not present

## 2019-11-26 DIAGNOSIS — G20 Parkinson's disease: Secondary | ICD-10-CM | POA: Diagnosis not present

## 2019-11-27 DIAGNOSIS — M6281 Muscle weakness (generalized): Secondary | ICD-10-CM | POA: Diagnosis not present

## 2019-11-27 DIAGNOSIS — G20 Parkinson's disease: Secondary | ICD-10-CM | POA: Diagnosis not present

## 2019-11-29 DIAGNOSIS — G20 Parkinson's disease: Secondary | ICD-10-CM | POA: Diagnosis not present

## 2019-11-29 DIAGNOSIS — M6281 Muscle weakness (generalized): Secondary | ICD-10-CM | POA: Diagnosis not present

## 2019-11-30 DIAGNOSIS — M6281 Muscle weakness (generalized): Secondary | ICD-10-CM | POA: Diagnosis not present

## 2019-11-30 DIAGNOSIS — G20 Parkinson's disease: Secondary | ICD-10-CM | POA: Diagnosis not present

## 2019-12-03 DIAGNOSIS — M6281 Muscle weakness (generalized): Secondary | ICD-10-CM | POA: Diagnosis not present

## 2019-12-03 DIAGNOSIS — G20 Parkinson's disease: Secondary | ICD-10-CM | POA: Diagnosis not present

## 2019-12-04 DIAGNOSIS — I517 Cardiomegaly: Secondary | ICD-10-CM | POA: Diagnosis not present

## 2019-12-04 DIAGNOSIS — Y92009 Unspecified place in unspecified non-institutional (private) residence as the place of occurrence of the external cause: Secondary | ICD-10-CM | POA: Diagnosis not present

## 2019-12-04 DIAGNOSIS — M6281 Muscle weakness (generalized): Secondary | ICD-10-CM | POA: Diagnosis not present

## 2019-12-04 DIAGNOSIS — M5030 Other cervical disc degeneration, unspecified cervical region: Secondary | ICD-10-CM | POA: Diagnosis not present

## 2019-12-04 DIAGNOSIS — Y999 Unspecified external cause status: Secondary | ICD-10-CM | POA: Diagnosis not present

## 2019-12-04 DIAGNOSIS — S0990XA Unspecified injury of head, initial encounter: Secondary | ICD-10-CM | POA: Diagnosis not present

## 2019-12-04 DIAGNOSIS — G20 Parkinson's disease: Secondary | ICD-10-CM | POA: Diagnosis not present

## 2019-12-04 DIAGNOSIS — S01411A Laceration without foreign body of right cheek and temporomandibular area, initial encounter: Secondary | ICD-10-CM | POA: Diagnosis not present

## 2019-12-04 DIAGNOSIS — J321 Chronic frontal sinusitis: Secondary | ICD-10-CM | POA: Diagnosis not present

## 2019-12-04 DIAGNOSIS — Z880 Allergy status to penicillin: Secondary | ICD-10-CM | POA: Diagnosis not present

## 2019-12-04 DIAGNOSIS — W0110XA Fall on same level from slipping, tripping and stumbling with subsequent striking against unspecified object, initial encounter: Secondary | ICD-10-CM | POA: Diagnosis not present

## 2019-12-04 DIAGNOSIS — J322 Chronic ethmoidal sinusitis: Secondary | ICD-10-CM | POA: Diagnosis not present

## 2019-12-04 DIAGNOSIS — W1830XA Fall on same level, unspecified, initial encounter: Secondary | ICD-10-CM | POA: Diagnosis not present

## 2019-12-04 DIAGNOSIS — N200 Calculus of kidney: Secondary | ICD-10-CM | POA: Diagnosis not present

## 2019-12-04 DIAGNOSIS — S299XXA Unspecified injury of thorax, initial encounter: Secondary | ICD-10-CM | POA: Diagnosis not present

## 2019-12-05 DIAGNOSIS — M6281 Muscle weakness (generalized): Secondary | ICD-10-CM | POA: Diagnosis not present

## 2019-12-05 DIAGNOSIS — G20 Parkinson's disease: Secondary | ICD-10-CM | POA: Diagnosis not present

## 2019-12-06 DIAGNOSIS — G20 Parkinson's disease: Secondary | ICD-10-CM | POA: Diagnosis not present

## 2019-12-06 DIAGNOSIS — M6281 Muscle weakness (generalized): Secondary | ICD-10-CM | POA: Diagnosis not present

## 2019-12-07 ENCOUNTER — Telehealth: Payer: Self-pay

## 2019-12-07 DIAGNOSIS — Z5189 Encounter for other specified aftercare: Secondary | ICD-10-CM | POA: Diagnosis not present

## 2019-12-07 NOTE — Telephone Encounter (Signed)
Please try to check on patient/check with her son.  It sounds like she needs to be rechecked whenever/however possible.  Thanks.

## 2019-12-07 NOTE — Telephone Encounter (Signed)
Unable to reach pt by phone; unable to speak with Rolan Bucco (DPR signed)sending note to Dr [name] and Cardell Peach RN.

## 2019-12-07 NOTE — Telephone Encounter (Signed)
[place] RECORD [facility] Patient Name: [name] Gender: Female DOB: [birth year] Age: 79 Y 2 M 12 D Return Phone Number: [phone] (Primary), [phone] (Secondary) Address: City/State/Zip: [place] [address] Client [facility] - Client Client Site [place] - Day Physician Renford Dills - MD Contact Type Call Who Is Calling Patient / Member / Family / Caregiver Call Type Triage / Clinical Relationship To Patient Self Return Phone Number [phone] (Secondary) Chief Complaint Eye Pain Reason for Call Symptomatic / Request for [place] states she fell and hurt her skin around her eye, she did go to the ER. This happened on Tuesday. She feels a discharge on eye, still has dressing on. Some eye pain. Translation No Nurse Assessment Nurse: Ronnald Ramp, RN, Miranda Date/Time (Eastern Time): 12/07/2019 9:42:46 AM Confirm and document reason for call. If symptomatic, describe symptoms. ---Caller states she fell on Tuesday and skinned up her face near her right eye. She was seen in the ED and told she had an abrasion. She was told to call the MD office for a follow up but the doctor does not have an available appt. She can feel something draining beneath the bandage. She feels like the bandage needs to be changed. Does the patient have any new or worsening symptoms? ---Yes Will a triage be completed? ---Yes Related visit to physician within the last 2 weeks? ---Yes Does the PT have any chronic conditions? (i.e. diabetes, asthma, this includes High risk factors for pregnancy, etc.) ---Yes List chronic conditions. ---Parkinson's disease, Multiple Myeloma, Is this a behavioral health or substance abuse call? ---No Guidelines Guideline Title Affirmed Question Affirmed Notes Nurse Date/Time Eilene Ghazi Time) Skin Injury Minor scrape  (abrasion) Ronnald Ramp, RN, Miranda 12/07/2019 9:45:52 AM Disp. Time Eilene Ghazi Time) Disposition Final User 12/07/2019 9:49:46 AM See PCP within 24 Hours Yes Ronnald Ramp, RN, Miranda Disposition Overriden: Home Care Override Reason: Patient's symptoms need a higher level of care PLEASE NOTE: All timestamps contained within this report are represented as [place] Standard Time. CONFIDENTIALTY NOTICE: This fax transmission is intended only for the addressee. It contains information that is legally privileged, confidential or otherwise protected from use or disclosure. If you are not the intended recipient, you are strictly prohibited from reviewing, disclosing, copying using or disseminating any of this information or taking any action in reliance on or regarding this information. If you have received this fax in error, please notify us immediately by telephone so that we can arrange for its return to [place]. Phone: [phone], Toll-Free: [phone], Fax: [phone] Page: 2 of 2 Call Id: [id number] [place] Disagree/Comply Comply Caller Understands Yes PreDisposition Call Doctor Care Advice Given Per Guideline SEE PCP WITHIN 24 HOURS: * IF OFFICE WILL BE OPEN: You need to be examined within the next 24 hours. Call your doctor (or NP/PA) when the office opens and make an appointment. CARE ADVICE given per Skin Injury (Adult) guideline. CALL BACK IF: * Fever occurs * You become worse Comments User: Leverne Humbles, RN Date/Time (Eastern Time): 12/07/2019 9:50:32 AM Caller states she has already been told by the office they do not have any appts until Monday. Told caller to go to UC to be seen. Referrals GO TO FACILITY UNDECIDED

## 2019-12-09 ENCOUNTER — Telehealth: Payer: Self-pay | Admitting: Family Medicine

## 2019-12-09 NOTE — Telephone Encounter (Signed)
Called and left message on voicemail for patient and for her son.  Message left that we had trouble with the scheduling at clinic and I was asking if she can come in tomorrow at 1130 instead of 8:00.  I will be glad to see this pleasant patient whenever I see her.  I apologize for any problems that late notice regarding scheduling would cause for the patient or family.

## 2019-12-10 ENCOUNTER — Inpatient Hospital Stay: Payer: Medicare Other | Admitting: Family Medicine

## 2019-12-10 ENCOUNTER — Telehealth: Payer: Self-pay

## 2019-12-10 DIAGNOSIS — G20 Parkinson's disease: Secondary | ICD-10-CM | POA: Diagnosis not present

## 2019-12-10 DIAGNOSIS — M6281 Muscle weakness (generalized): Secondary | ICD-10-CM | POA: Diagnosis not present

## 2019-12-10 NOTE — Telephone Encounter (Signed)
Called patient on behalf of Dr. [name]. Patient says she called over the weekend and canceled her visit for today. Patient says she called our office for a same day appt on Friday. Unfortunately, we were totally booked. Patient says we advised her to visit an urgent care... if she needed immediate treatment concerning the bandages on her eye. Patient decided to visited the urgent care.

## 2019-12-10 NOTE — Telephone Encounter (Signed)
Called patient and spoke with her about her recent injury D/T a fall. Patient stated that she went to Urgent Care and they changed her bandage. Patient stated that they showed her how to change the dressing and she is doing this everyday. Patient also stated that they started her on an antibiotic, but she is not sure which one. Patient states that she feels better.

## 2019-12-10 NOTE — Telephone Encounter (Signed)
[place] Night - Client Nonclinical Telephone Record  [facility] Client [place] Night - Client Client Site [facility] Physician Renford Dills - MD Contact Type Call Who Is Calling Patient / Member / Family / Caregiver Caller Name Manhattan Mccuen Phone Number [phone] Patient Name [name] Patient DOB [birth year] Call Type Message Only Information Provided Reason for Call Request to [facility] Appointment Initial Comment Caller states she needs to cancel an appointment she had for Monday. Additional Comment Provided caller with office hours from profile. Caller declined RN triage. Disp. Time Disposition Final User 12/08/2019 9:58:09 AM General Information Provided Yes Harrington, Lauren Call Closed By: Philis Pique Transaction Date/Time: 12/08/2019 9:54:45 AM (ET)   Pt is coming in today at [facility]

## 2019-12-11 ENCOUNTER — Inpatient Hospital Stay: Payer: Medicare Other | Admitting: Family Medicine

## 2019-12-11 DIAGNOSIS — G20 Parkinson's disease: Secondary | ICD-10-CM | POA: Diagnosis not present

## 2019-12-11 DIAGNOSIS — M6281 Muscle weakness (generalized): Secondary | ICD-10-CM | POA: Diagnosis not present

## 2019-12-11 NOTE — Telephone Encounter (Signed)
Noted. Thanks.

## 2019-12-12 DIAGNOSIS — G20 Parkinson's disease: Secondary | ICD-10-CM | POA: Diagnosis not present

## 2019-12-12 DIAGNOSIS — M6281 Muscle weakness (generalized): Secondary | ICD-10-CM | POA: Diagnosis not present

## 2019-12-13 DIAGNOSIS — G20 Parkinson's disease: Secondary | ICD-10-CM | POA: Diagnosis not present

## 2019-12-13 DIAGNOSIS — M6281 Muscle weakness (generalized): Secondary | ICD-10-CM | POA: Diagnosis not present

## 2019-12-13 NOTE — Telephone Encounter (Signed)
I want her to get checked sooner rather than later.  I think it makes sense to get checked in [place] now instead of coming all the way here to clinic. Please get update on patient tomorrow.  Thanks.

## 2019-12-13 NOTE — Telephone Encounter (Signed)
I spoke with pt; pt said under the eye laceration is draining yellow drainage; the area is gapping open. Pt had finished abx given at ED first of Oct.  No fever but area is not hurting as much as it did but is still hurting. Pt said her vision is blurred. No available appts 12/13/19 or 12/14/19. Pt said she will go to UC in [place]. FYI to Dr [name].

## 2019-12-13 NOTE — Telephone Encounter (Signed)
[place] RECORD [facility] Patient Name: [name] Gender: Female DOB: [birth year] Age: 79 Y 2 M 18 D Return Phone Number: [phone] (Primary), [phone] (Secondary) Address: City/State/Zip: [place] [address] Client [facility] - Client Client Site [place] - Day Physician Renford Dills - MD Contact Type Call Who Is Calling Patient / Member / Family / Caregiver Call Type Triage / Clinical Relationship To Patient Self Return Phone Number [phone] (Secondary) Chief Complaint VISION - sudden loss or sudden decrease (NOT blurred vision) Reason for Call Symptomatic / Request for [name] states she fell, and something is going on with her eyes. Translation No Nurse Assessment Nurse: Rock Nephew, RN, Juliann Pulse Date/Time (Eastern Time): 12/13/2019 12:54:33 PM Confirm and document reason for call. If symptomatic, describe symptoms. ---Caller states she fell on 11/2 and hit her face, she was seen in ER and again in an [facility] . She had a laceration to her face ( under her eye ) and it did not require stitches. She was shown how to clean the wound . She is calling today because the wound is draining ( yellow ) . Does the patient have any new or worsening symptoms? ---Yes Will a triage be completed? ---Yes Related visit to physician within the last 2 weeks? ---Yes Does the PT have any chronic conditions? (i.e. diabetes, asthma, this includes High risk factors for pregnancy, etc.) ---No Is this a behavioral health or substance abuse call? ---No Guidelines Guideline Title Affirmed Question Affirmed Notes Nurse Date/Time (Eastern Time) Cuts and Lacerations [1] Looks infected (spreading redness, pus) AND [2] no fever Rock Nephew, RN, Juliann Pulse 12/13/2019 12:57:09 PM Disp. Time Eilene Ghazi Time) Disposition Final User 12/13/2019 12:48:02 PM Send to Urgent  Antony Haste 12/13/2019 12:58:26 PM See PCP within 24 Hours Yes Rock Nephew, RN, Gara Kroner Disagree/Comply Comply PLEASE NOTE: All timestamps contained within this report are represented as [place] Standard Time. CONFIDENTIALTY NOTICE: This fax transmission is intended only for the addressee. It contains information that is legally privileged, confidential or otherwise protected from use or disclosure. If you are not the intended recipient, you are strictly prohibited from reviewing, disclosing, copying using or disseminating any of this information or taking any action in reliance on or regarding this information. If you have received this fax in error, please notify us immediately by telephone so that we can arrange for its return to [place]. Phone: [phone], Toll-Free: [phone], Fax: [phone] Page: 2 of 2 Call Id: [id number] [place] Understands Yes PreDisposition Call Doctor Care Advice Given Per Guideline SEE PCP WITHIN 24 HOURS: * IF OFFICE WILL BE OPEN: You need to be examined within the next 24 hours. Call your doctor (or NP/PA) when the office opens and make an appointment. CALL BACK IF: * Fever occurs * You become worse CARE ADVICE given per Cuts and Lacerations (Adult) guideline. Comments User: Baruch Goldmann Date/Time Eilene Ghazi Time): 12/13/2019 12:46:26 PM Call transferred from office. Referrals REFERRED TO PCP OFFICE

## 2019-12-14 DIAGNOSIS — S0181XA Laceration without foreign body of other part of head, initial encounter: Secondary | ICD-10-CM | POA: Diagnosis not present

## 2019-12-14 DIAGNOSIS — W19XXXA Unspecified fall, initial encounter: Secondary | ICD-10-CM | POA: Diagnosis not present

## 2019-12-14 DIAGNOSIS — Z5189 Encounter for other specified aftercare: Secondary | ICD-10-CM | POA: Diagnosis not present

## 2019-12-14 DIAGNOSIS — S0590XA Unspecified injury of unspecified eye and orbit, initial encounter: Secondary | ICD-10-CM | POA: Diagnosis not present

## 2019-12-14 NOTE — Telephone Encounter (Signed)
Called and spoke with patient per Dr. [name]. Patient stated that she went to UC this morning (12/14/19). They changed her dressing and she is continuing with the antibiotics. The UC recommended that she make an appointment with her eye doctor. Patient stated that she will make an appointment with the eye doctor. Informed patient to call office back if she needs anything and if symptoms get worse, to return to UC. Patient verbalized understanding.

## 2019-12-16 NOTE — Telephone Encounter (Signed)
Noted. Thanks.

## 2019-12-17 DIAGNOSIS — G20 Parkinson's disease: Secondary | ICD-10-CM | POA: Diagnosis not present

## 2019-12-17 DIAGNOSIS — M6281 Muscle weakness (generalized): Secondary | ICD-10-CM | POA: Diagnosis not present

## 2019-12-18 DIAGNOSIS — G20 Parkinson's disease: Secondary | ICD-10-CM | POA: Diagnosis not present

## 2019-12-18 DIAGNOSIS — M6281 Muscle weakness (generalized): Secondary | ICD-10-CM | POA: Diagnosis not present

## 2019-12-19 DIAGNOSIS — M6281 Muscle weakness (generalized): Secondary | ICD-10-CM | POA: Diagnosis not present

## 2019-12-19 DIAGNOSIS — G20 Parkinson's disease: Secondary | ICD-10-CM | POA: Diagnosis not present

## 2019-12-21 DIAGNOSIS — M6281 Muscle weakness (generalized): Secondary | ICD-10-CM | POA: Diagnosis not present

## 2019-12-21 DIAGNOSIS — G20 Parkinson's disease: Secondary | ICD-10-CM | POA: Diagnosis not present

## 2019-12-24 DIAGNOSIS — Z23 Encounter for immunization: Secondary | ICD-10-CM | POA: Diagnosis not present

## 2019-12-25 DIAGNOSIS — M6281 Muscle weakness (generalized): Secondary | ICD-10-CM | POA: Diagnosis not present

## 2019-12-25 DIAGNOSIS — G20 Parkinson's disease: Secondary | ICD-10-CM | POA: Diagnosis not present

## 2019-12-31 DIAGNOSIS — G20 Parkinson's disease: Secondary | ICD-10-CM | POA: Diagnosis not present

## 2019-12-31 DIAGNOSIS — M6281 Muscle weakness (generalized): Secondary | ICD-10-CM | POA: Diagnosis not present

## 2020-01-03 DIAGNOSIS — M6281 Muscle weakness (generalized): Secondary | ICD-10-CM | POA: Diagnosis not present

## 2020-01-03 DIAGNOSIS — G20 Parkinson's disease: Secondary | ICD-10-CM | POA: Diagnosis not present

## 2020-01-05 ENCOUNTER — Other Ambulatory Visit: Payer: Self-pay | Admitting: Internal Medicine

## 2020-01-05 DIAGNOSIS — C50919 Malignant neoplasm of unspecified site of unspecified female breast: Secondary | ICD-10-CM

## 2020-01-10 ENCOUNTER — Other Ambulatory Visit: Payer: Self-pay

## 2020-01-10 ENCOUNTER — Ambulatory Visit (INDEPENDENT_AMBULATORY_CARE_PROVIDER_SITE_OTHER): Payer: Medicare Other | Admitting: Neurology

## 2020-01-10 ENCOUNTER — Encounter: Payer: Self-pay | Admitting: Neurology

## 2020-01-10 VITALS — BP 120/60 | HR 97 | Resp 18 | Ht 64.0 in | Wt 122.0 lb

## 2020-01-10 DIAGNOSIS — G20 Parkinson's disease: Secondary | ICD-10-CM | POA: Diagnosis not present

## 2020-01-10 DIAGNOSIS — K117 Disturbances of salivary secretion: Secondary | ICD-10-CM | POA: Diagnosis not present

## 2020-01-10 DIAGNOSIS — W19XXXA Unspecified fall, initial encounter: Secondary | ICD-10-CM

## 2020-01-10 DIAGNOSIS — M6281 Muscle weakness (generalized): Secondary | ICD-10-CM | POA: Diagnosis not present

## 2020-01-10 MED ORDER — INCOBOTULINUMTOXINA 100 UNITS IM SOLR
100.0000 [IU] | INTRAMUSCULAR | Status: DC
  Administered 2020-01-10: 100 [IU] via INTRAMUSCULAR

## 2020-01-10 NOTE — Procedures (Signed)
Botulinum Clinic   History:  Diagnosis: Sialorrhea associated with PD (icd10: K11.7)    Informed consent was obtained.  Discussed differences between myobloc and xeomin.  The patient was educated on the botulinum toxin the black blox warning and given a copy of the botox patient medication guide.  The patient understands that this warning states that there have been reported cases of the Botox extending beyond the injection site and creating adverse effects, similar to those of botulism. This included loss of strength, trouble walking, hoarseness, trouble saying words clearly, loss of bladder control, trouble breathing, trouble swallowing, diplopia, blurry vision and ptosis. Most of the distant spread of Botox was happening in patients, primarily children, who received medication for spasticity or for cervical dystonia. The patient expressed understanding and desire to proceed.   Injections  Location Left  Right Units Number of sites  Parotid (point 1 on picture) 30 30 60 1 per side  Submandibular (point 2) 20 20 40 1 per side  TOTAL UNITS:   100    Type of Toxin: Xeomin Discarded Units: 0  Needle drawback with each injection was free of blood. Pt tolerated procedure well without complications.   Reinjection is anticipated in 4 months.

## 2020-01-10 NOTE — Patient Instructions (Signed)
You have an appointment scheduled with Dr Manuella Ghazi on January 11, 2020 at 12:50pm in the [place] office.

## 2020-01-10 NOTE — Progress Notes (Signed)
Assessment/Plan:   1.   Parkinsons Disease  -change carbidopa/levodopa 25/100, 1 tablet at 7 AM/ 1/2 tablet at 9, 1/2 tablet at 11 AM/1/2 tablet at 1pm/1/2 tablet at 3 PM/1/2 tablet at 5pm/ 1/2 tablet at 7 PM  -Less dyskinesia today.  That is good because amantadine caused hallucinations.  -Concerned about DBS in her given lack of robust support system.  -told her to use walker at all times  2.  Sialorrhea  -continue xeomin injections.  This was done today and on a separate procedural note  3.  LBP since fall  -limit tramadol due to hallucinations.  Discussed with the patient that this medication can definitely cause/contribute to hallucinations in Parkinsons Disease patients  4.  rbd  -on klonopin, 0.5 mg, 1/2 po q hs.  Rarely taking  5.  Eye injury on tree root after fall  -Concerned about her.  She has never been able to see out of that (sounds like congenital atrophy).  I am getting her an appointment with her ophthalmologist, Dr. Tama High.  Patient also wanted to see her primary care physician and I am going to try to set her up an appointment with him.  Subjective:   [name] was seen today.  She was supposed to be just here for Botox for sialorrhea.  She mentioned that she fell last month and fell on a tree root that was covered up by leaves and hurt her eye.  She states that she has not been in to see the eye doctor.  States that she was hoping to get into see her primary care doctor instead, but has not been able to achieve an appointment.  States that she has called several times without success.  She has been treated at local urgent care.  Still having issues with the right eye.  She states that she has never been able to see out of that right eye (sounds like congenital atrophy).  She does have an ophthalmologist.  [facility].  She has found that to be very helpful and has only 2  visits left.  Current prescribed movement disorder medications: Carbidopa/levodopa 25/100, carbidopa/levodopa 25/100, 1 tablet at 7 AM/ 1/2 tablet at 9, 1/2 tablet at 11 AM/1/2 tablet at 1pm/1/2 tablet at 3 PM/1/2 tablet at 5pm/ 1/2 tablet at 7 PM (changed when I saw her in oct) Amantadine discontinued last visit because of hallucinations ? Klonopin, 0.5 mg, 1/2 po q hs (not filling regularly)    ALLERGIES:   Allergies  Allergen Reactions  . Penicillins Shortness Of Breath and Itching    CURRENT MEDICATIONS:  Outpatient Encounter Medications as of 01/10/2020  Medication Sig  . aspirin 81 MG tablet Take 81 mg by mouth daily.    [name] b complex vitamins tablet Take 1 tablet by mouth daily.  . Calcium 1500 MG tablet Take 1,500 mg by mouth.    . carbidopa-levodopa (SINEMET IR) 25-100 MG tablet 1 1 tablet at 7 AM/ 1/2 tablet at 9, 1/2 tablet at 11 AM/1/2 tablet at 1pm/1/2 tablet at 3 PM/1/2 tablet at 5pm/ 1/2 tablet at 7 PM  . Cholecalciferol 5000 units TABS Take 1 tablet (5,000 Units total) by mouth daily.  . clonazePAM (KLONOPIN) 0.5 MG tablet TAKE 1/2 (ONE-HALF) TABLET BY MOUTH AT BEDTIME  . Denosumab (PROLIA [place]) Inject into the skin every 6 (six) months.  Regino Schultze Bandages & Supports (ABDOMINAL BINDER/ELASTIC SMALL) MISC  1 Device by Does not apply route daily.  . Ferrous Sulfate (IRON) 325 (65 Fe) MG TABS Take 325 mg by mouth.  . Multiple Vitamin (MULTIVITAMIN) capsule 2 tabs po qd   . Omega-3 Fatty Acids (OMEGA 3 PO) 1 tab po qd   . omeprazole (PRILOSEC) 20 MG capsule TAKE (1) CAPSULE BY MOUTH ONCE DAILY.  . OnabotulinumtoxinA (BOTOX IM) Inject into the muscle every 3 (three) months. Injection every 3 months  . tamoxifen (NOLVADEX) 20 MG tablet TAKE 1 TABLET BY MOUTH ONCE DAILY.  . traMADol (ULTRAM) 50 MG tablet TAKE (1) TABLET BY MOUTH EVERY TWELVE HOURS AS NEEDED FOR PAIN.  [name] venlafaxine XR (EFFEXOR-XR) 75 MG 24 hr capsule TAKE 1 CAPSULE BY MOUTH ONCE DAILY WITH BREAKFAST  . vitamin  B-12 (CYANOCOBALAMIN) 500 MCG tablet Take 2500 mcg daily.   Facility-Administered Encounter Medications as of 01/10/2020  Medication  . incobotulinumtoxinA (XEOMIN) 100 units injection 100 Units  . incobotulinumtoxinA (XEOMIN) 100 units injection 100 Units    Objective:   PHYSICAL EXAMINATION:    VITALS:   Vitals:   01/10/20 1120  BP: 120/60  Pulse: 97  Resp: 18  SpO2: 98%  Weight: 122 lb (55.3 kg)  Height: 5\' 4"  (1.626 m)    GEN:  The patient appears stated age and is in NAD. HEENT:  Normocephalic.  The lower orbicularis oculi is stretched out and no longer is symmetric with the other side.  It is slightly edematous, but no significant erythema.  There is no drainage.  He mucous membranes are moist. The superficial temporal arteries are without ropiness or tenderness. CV:  RRR Lungs:  CTAB Neck/HEME:  There are no carotid bruits bilaterally.  Neurological examination:  Orientation: The patient is alert and oriented x3. Cranial nerves: There is good facial symmetry with facial hypomimia. The speech is fluent and clear. Soft palate rises symmetrically and there is no tongue deviation. Hearing is intact to conversational tone. Sensation: Sensation is intact to light touch throughout Motor: Strength is at least antigravity x4.  Movement examination: Tone: There is normal tone in the UE/LE Abnormal movements: she has no significant dyskinesia today. Coordination:  There is mild decremation with RAM's, esp of the L foot Gait and Station: The patient has difficulty arising out of a deep-seated chair without the use of the hands.  Patient pushes off of the chair to arise.  The patient's stride length is decreased but stride length is good.   I have reviewed and interpreted the following labs independently    Chemistry      Component Value Date/Time   NA 141 10/25/2019 0922   NA 141 10/13/2016 1141   K 4.2 10/25/2019 0922   K 3.6 10/13/2016 1141   CL 106 10/25/2019 0922    CL 107 03/21/2012 1501   CO2 27 10/25/2019 0922   CO2 27 10/13/2016 1141   BUN 13 10/25/2019 0922   BUN 20.0 10/13/2016 1141   CREATININE 0.73 10/25/2019 0922   CREATININE 0.81 10/11/2019 1013   CREATININE 0.8 10/13/2016 1141      Component Value Date/Time   CALCIUM 9.0 10/25/2019 0922   CALCIUM 9.5 10/13/2016 1141   ALKPHOS 60 10/25/2019 0922   ALKPHOS 50 10/13/2016 1141   AST 22 10/25/2019 0922   AST 27 10/11/2019 1013   AST 25 10/13/2016 1141   ALT 13 10/25/2019 0922   ALT 20 10/11/2019 1013   ALT 17 10/13/2016 1141   BILITOT 0.5 10/25/2019 0814  BILITOT 0.7 10/11/2019 1013   BILITOT 0.27 10/13/2016 1141       Lab Results  Component Value Date   WBC 6.4 10/25/2019   HGB 12.9 10/25/2019   HCT 38.8 10/25/2019   MCV 102.4 (H) 10/25/2019   PLT 258.0 10/25/2019    Lab Results  Component Value Date   TSH 4.75 (H) 10/25/2019   Lab Results  Component Value Date   VITAMINB12 1,277 (H) 10/25/2019   Lab Results  Component Value Date   IRON 111 10/25/2019   FERRITIN 78.8 10/25/2019     Total time spent on today's visit was 20 minutes, including both face-to-face time and nonface-to-face time.  Time included that spent on review of records (prior notes available to me/labs/imaging if pertinent), discussing treatment and goals, answering patient's questions and coordinating care.  Cc:  Tonia Ghent, MD

## 2020-01-11 DIAGNOSIS — H02122 Mechanical ectropion of right lower eyelid: Secondary | ICD-10-CM | POA: Diagnosis not present

## 2020-01-12 ENCOUNTER — Telehealth: Payer: Self-pay | Admitting: Family Medicine

## 2020-01-12 NOTE — Telephone Encounter (Signed)
Please call patient about follow up appointment, please schedule.  Thanks.

## 2020-01-14 NOTE — Progress Notes (Deleted)
Assessment/Plan:   1.  Parkinsons Disease  -***Continue carbidopa/levodopa 25/100, 1 tablet at 7 AM/ 1/2 tablet at 9, 1/2 tablet at 11 AM/1/2 tablet at 1pm/1/2 tablet at 3 PM/1/2 tablet at 5pm/ 1/2 tablet at 7 PM  2.  Sialorrhea  -On Xeomin and it is working well.  3.  RBD  -Rarely takes clonazepam, 0.5 mg, half tablet at bed. Subjective:   [name] was seen today in follow up for Parkinsons disease.  My previous records were reviewed prior to todays visit as well as outside records available to me.  When I saw the patient for Botox recently, she had tripped over a tree root and had an eye injury.  I subsequently referred her to ophthalmology.  ***she saw Dr. Manuella Ghazi.   Last visit, we discontinued her amantadine because of hallucinations.  We also talked to her about limiting tramadol (had low back pain after a fall).  She is no longer taking that.  Mood has been good.  Current prescribed movement disorder medications: ***carbidopa/levodopa 25/100, 1 tablet at 7 AM/ 1/2 tablet at 9, 1/2 tablet at 11 AM/1/2 tablet at 1pm/1/2 tablet at 3 PM/1/2 tablet at 5pm/ 1/2 tablet at 7 PM Clonazepam 0.5 mg, half tablet at bed  PREVIOUS MEDICATIONS: Amantadine (hallucinations); has clonazepam but rarely takes  ALLERGIES:   Allergies  Allergen Reactions  . Penicillins Shortness Of Breath and Itching    CURRENT MEDICATIONS:  Outpatient Encounter Medications as of 01/28/2020  Medication Sig  . aspirin 81 MG tablet Take 81 mg by mouth daily.    [name] b complex vitamins tablet Take 1 tablet by mouth daily.  . Calcium 1500 MG tablet Take 1,500 mg by mouth.    . carbidopa-levodopa (SINEMET IR) 25-100 MG tablet 1 1 tablet at 7 AM/ 1/2 tablet at 9, 1/2 tablet at 11 AM/1/2 tablet at 1pm/1/2 tablet at 3 PM/1/2 tablet at 5pm/ 1/2 tablet at 7 PM  . Cholecalciferol 5000 units TABS Take 1 tablet (5,000 Units total) by mouth daily.  . clonazePAM (KLONOPIN) 0.5 MG tablet TAKE 1/2 (ONE-HALF) TABLET BY MOUTH  AT BEDTIME  . Denosumab (PROLIA [place]) Inject into the skin every 6 (six) months.  . Elastic Bandages & Supports (ABDOMINAL BINDER/ELASTIC SMALL) MISC 1 Device by Does not apply route daily.  . Ferrous Sulfate (IRON) 325 (65 Fe) MG TABS Take 325 mg by mouth.  . Multiple Vitamin (MULTIVITAMIN) capsule 2 tabs po qd   . Omega-3 Fatty Acids (OMEGA 3 PO) 1 tab po qd   . omeprazole (PRILOSEC) 20 MG capsule TAKE (1) CAPSULE BY MOUTH ONCE DAILY.  . OnabotulinumtoxinA (BOTOX IM) Inject into the muscle every 3 (three) months. Injection every 3 months  . tamoxifen (NOLVADEX) 20 MG tablet TAKE 1 TABLET BY MOUTH ONCE DAILY.  . traMADol (ULTRAM) 50 MG tablet TAKE (1) TABLET BY MOUTH EVERY TWELVE HOURS AS NEEDED FOR PAIN.  [name] venlafaxine XR (EFFEXOR-XR) 75 MG 24 hr capsule TAKE 1 CAPSULE BY MOUTH ONCE DAILY WITH BREAKFAST  . vitamin B-12 (CYANOCOBALAMIN) 500 MCG tablet Take 2500 mcg daily.   Facility-Administered Encounter Medications as of 01/28/2020  Medication  . incobotulinumtoxinA (XEOMIN) 100 units injection 100 Units  . incobotulinumtoxinA (XEOMIN) 100 units injection 100 Units    Objective:   PHYSICAL EXAMINATION:    VITALS:  There were no vitals filed for this visit.  GEN:  The patient appears stated age and is in NAD. HEENT:  Normocephalic, atraumatic.  The mucous membranes  are moist. The superficial temporal arteries are without ropiness or tenderness. CV:  RRR Lungs:  CTAB Neck/HEME:  There are no carotid bruits bilaterally.  Neurological examination:  Orientation: The patient is alert and oriented x3. Cranial nerves: There is good facial symmetry with*** facial hypomimia. The speech is fluent and clear. Soft palate rises symmetrically and there is no tongue deviation. Hearing is intact to conversational tone. Sensation: Sensation is intact to light touch throughout Motor: Strength is at least antigravity x4.  Movement examination: Tone: There is ***tone in the *** Abnormal movements:  *** Coordination:  There is *** decremation with RAM's, *** Gait and Station: The patient has *** difficulty arising out of a deep-seated chair without the use of the hands. The patient's stride length is ***.  The patient has a *** pull test.     I have reviewed and interpreted the following labs independently    Chemistry      Component Value Date/Time   NA 141 10/25/2019 0922   NA 141 10/13/2016 1141   K 4.2 10/25/2019 0922   K 3.6 10/13/2016 1141   CL 106 10/25/2019 0922   CL 107 03/21/2012 1501   CO2 27 10/25/2019 0922   CO2 27 10/13/2016 1141   BUN 13 10/25/2019 0922   BUN 20.0 10/13/2016 1141   CREATININE 0.73 10/25/2019 0922   CREATININE 0.81 10/11/2019 1013   CREATININE 0.8 10/13/2016 1141      Component Value Date/Time   CALCIUM 9.0 10/25/2019 0922   CALCIUM 9.5 10/13/2016 1141   ALKPHOS 60 10/25/2019 0922   ALKPHOS 50 10/13/2016 1141   AST 22 10/25/2019 0922   AST 27 10/11/2019 1013   AST 25 10/13/2016 1141   ALT 13 10/25/2019 0922   ALT 20 10/11/2019 1013   ALT 17 10/13/2016 1141   BILITOT 0.5 10/25/2019 0922   BILITOT 0.7 10/11/2019 1013   BILITOT 0.27 10/13/2016 1141       Lab Results  Component Value Date   WBC 6.4 10/25/2019   HGB 12.9 10/25/2019   HCT 38.8 10/25/2019   MCV 102.4 (H) 10/25/2019   PLT 258.0 10/25/2019    Lab Results  Component Value Date   TSH 4.75 (H) 10/25/2019     Total time spent on today's visit was ***30 minutes, including both face-to-face time and nonface-to-face time.  Time included that spent on review of records (prior notes available to me/labs/imaging if pertinent), discussing treatment and goals, answering patient's questions and coordinating care.  Cc:  Tonia Ghent, MD

## 2020-01-17 DIAGNOSIS — G20 Parkinson's disease: Secondary | ICD-10-CM | POA: Diagnosis not present

## 2020-01-17 DIAGNOSIS — M6281 Muscle weakness (generalized): Secondary | ICD-10-CM | POA: Diagnosis not present

## 2020-01-24 ENCOUNTER — Ambulatory Visit: Payer: Medicare Other | Admitting: Family Medicine

## 2020-01-28 ENCOUNTER — Ambulatory Visit: Payer: Medicare Other | Admitting: Neurology

## 2020-01-30 DIAGNOSIS — G20 Parkinson's disease: Secondary | ICD-10-CM | POA: Diagnosis not present

## 2020-01-30 DIAGNOSIS — M6281 Muscle weakness (generalized): Secondary | ICD-10-CM | POA: Diagnosis not present

## 2020-01-31 ENCOUNTER — Ambulatory Visit: Payer: Medicare Other | Admitting: Family Medicine

## 2020-02-11 ENCOUNTER — Ambulatory Visit: Payer: Medicare Other | Admitting: Family Medicine

## 2020-02-22 ENCOUNTER — Other Ambulatory Visit: Payer: Self-pay

## 2020-02-25 ENCOUNTER — Encounter: Payer: Self-pay | Admitting: Family Medicine

## 2020-02-25 ENCOUNTER — Other Ambulatory Visit: Payer: Self-pay

## 2020-02-25 ENCOUNTER — Ambulatory Visit (INDEPENDENT_AMBULATORY_CARE_PROVIDER_SITE_OTHER): Payer: Medicare Other | Admitting: Family Medicine

## 2020-02-25 VITALS — BP 130/70 | HR 96 | Temp 97.7°F | Ht 64.0 in | Wt 127.0 lb

## 2020-02-25 DIAGNOSIS — K921 Melena: Secondary | ICD-10-CM

## 2020-02-25 DIAGNOSIS — M545 Low back pain, unspecified: Secondary | ICD-10-CM

## 2020-02-25 DIAGNOSIS — G20 Parkinson's disease: Secondary | ICD-10-CM

## 2020-02-25 DIAGNOSIS — R109 Unspecified abdominal pain: Secondary | ICD-10-CM | POA: Diagnosis not present

## 2020-02-25 LAB — COMPREHENSIVE METABOLIC PANEL
ALT: 3 U/L (ref 0–35)
AST: 23 U/L (ref 0–37)
Albumin: 3.7 g/dL (ref 3.5–5.2)
Alkaline Phosphatase: 45 U/L (ref 39–117)
BUN: 14 mg/dL (ref 6–23)
CO2: 28 mEq/L (ref 19–32)
Calcium: 9.1 mg/dL (ref 8.4–10.5)
Chloride: 107 mEq/L (ref 96–112)
Creatinine, Ser: 0.71 mg/dL (ref 0.40–1.20)
GFR: 81.44 mL/min (ref >60.00)
Glucose, Bld: 89 mg/dL (ref 70–99)
Potassium: 4.4 mEq/L (ref 3.5–5.1)
Sodium: 142 mEq/L (ref 135–145)
Total Bilirubin: 0.5 mg/dL (ref 0.2–1.2)
Total Protein: 6.9 g/dL (ref 6.0–8.3)

## 2020-02-25 LAB — CBC WITH DIFFERENTIAL/PLATELET
Basophils Absolute: 0.1 10*3/uL (ref 0.0–0.1)
Basophils Relative: 0.8 % (ref 0.0–3.0)
Eosinophils Absolute: 0.4 10*3/uL (ref 0.0–0.7)
Eosinophils Relative: 4.7 % (ref 0.0–5.0)
HCT: 38.6 % (ref 36.0–46.0)
Hemoglobin: 12.6 g/dL (ref 12.0–15.0)
Lymphocytes Relative: 27.1 % (ref 12.0–46.0)
Lymphs Abs: 2.2 10*3/uL (ref 0.7–4.0)
MCHC: 32.7 g/dL (ref 30.0–36.0)
MCV: 98.1 fl (ref 78.0–100.0)
Monocytes Absolute: 0.5 10*3/uL (ref 0.1–1.0)
Monocytes Relative: 5.7 % (ref 3.0–12.0)
Neutro Abs: 5.1 10*3/uL (ref 1.4–7.7)
Neutrophils Relative %: 61.7 % (ref 43.0–77.0)
Platelets: 229 10*3/uL (ref 150.0–400.0)
RBC: 3.94 Mil/uL (ref 3.87–5.11)
RDW: 14.5 % (ref 11.5–15.5)
WBC: 8.3 10*3/uL (ref 4.0–10.5)

## 2020-02-25 LAB — IRON: Iron: 96 ug/dL (ref 42–145)

## 2020-02-25 LAB — LIPASE: Lipase: 14 U/L (ref 11.0–59.0)

## 2020-02-25 MED ORDER — TIZANIDINE HCL 4 MG PO TABS: 4.0000 mg | ORAL_TABLET | Freq: Three times a day (TID) | ORAL | 1 refills | Status: DC | PRN

## 2020-02-25 MED ORDER — OMEPRAZOLE 20 MG PO CPDR: 40.0000 mg | DELAYED_RELEASE_CAPSULE | Freq: Every day | ORAL | Status: DC

## 2020-02-25 NOTE — Patient Instructions (Addendum)
Go to the lab on the way out.   If you have mychart we'll likely use that to update you.    If you have any shortness of breath, then go to the ER or dial 911.   Stop taking any aleve or ibuprofen or aspirin.  Take a double dose of omeprazole each day in the meantime.  Take care.  Glad to see you.

## 2020-02-25 NOTE — Progress Notes (Signed)
This visit occurred during the SARS-CoV-2 public health emergency.  Safety protocols were in place, including screening questions prior to the visit, additional usage of staff PPE, and extensive cleaning of exam room while observing appropriate contact time as indicated for disinfecting solutions.  Using rollator at baseline, parking form done for parkinsons.  D/w pt.  She has baseline trunk and arm movements from parkinsons.  She has had follow-up with neurology.  R lower eyelid had healed in the meantime, after skin tear in 11/21.   She is off tramadol. She is still having L lower back pain.  She is trying to get by taking aleve.  She has been seeing Dr. Ron Agee.  She has used tizanidine prev with some relief of back pain.   She is having abd cramping and black tarry stools.  She has leakage of stools.  Going on for about 2 months.  She is still on iron at baseline. Mild discomfort at this point, lower midline abd pain.  She is not having any chest pain.  Meds, vitals, and allergies reviewed.   ROS: Per HPI unless specifically indicated in ROS section   GEN: nad, alert and oriented HEENT: ncat, right lower eyelid has healed in the meantime. NECK: supple w/o LA CV: rrr PULM: ctab, no inc wob ABD: soft, +bs, not ttp EXT: no edema SKIN: no acute rash She has baseline trunk and extremity movements due to Parkinson's.

## 2020-02-27 ENCOUNTER — Other Ambulatory Visit: Payer: Self-pay | Admitting: Family Medicine

## 2020-02-27 DIAGNOSIS — K921 Melena: Secondary | ICD-10-CM | POA: Insufficient documentation

## 2020-02-27 NOTE — Assessment & Plan Note (Addendum)
Unclear if this is from iron replacement, GI bleed, or both.  Advised to stop NSAIDs in the meantime.  Check routine labs today.  No abdominal pain on exam.  Abdomen is not tender.  Normal bowel sounds.  At this point still okay for outpatient follow-up considering her symptoms have been going on for 2 months and she is not short of breath or having chest pain.  Advised to increase her omeprazole to 2 tabs a day in the meantime.

## 2020-02-27 NOTE — Assessment & Plan Note (Signed)
Would stop NSAIDs in the meantime.  See notes on labs.  Would avoid tramadol for now.  Would continue tizanidine.  Prescription sent.

## 2020-02-27 NOTE — Assessment & Plan Note (Signed)
Per neurology.  Parking form done.  Continue using a walker/cane.  Cautions discussed with patient.  She agrees with plan.

## 2020-03-06 ENCOUNTER — Encounter: Payer: Self-pay | Admitting: Gastroenterology

## 2020-03-13 ENCOUNTER — Telehealth: Payer: Self-pay | Admitting: Family Medicine

## 2020-03-13 NOTE — Telephone Encounter (Signed)
LVM for pt to rtn my call to schedule awv with nha. 

## 2020-03-20 ENCOUNTER — Telehealth: Payer: Self-pay | Admitting: Family Medicine

## 2020-03-20 DIAGNOSIS — R109 Unspecified abdominal pain: Secondary | ICD-10-CM

## 2020-03-20 NOTE — Telephone Encounter (Signed)
Pt called in wanted to know if Dr. [name] to call her about the issue she is having , she wouldn't tell me

## 2020-03-20 NOTE — Telephone Encounter (Signed)
Spoke with patient and she states she is still having the problem with her bowels and it seems to be getting worse. States she never feels anything come out like she discussed in her OV; she can feel it when when it backs up in her underwear and is wet. She wants to know if there is something she can take or do or maybe if its her diet that is causing this?   Also states that taking the 2 omeprazole is not helping the pain she has been having.  Patient also wanted you to be aware that her finger tips have started to get numb at times since her OV.

## 2020-03-21 ENCOUNTER — Encounter: Payer: Self-pay | Admitting: Nurse Practitioner

## 2020-03-21 NOTE — Telephone Encounter (Signed)
I think we need to get her set up with GI and I put in the referral.  Did she notice any changes with stopping iron?  Please let me know.

## 2020-03-21 NOTE — Telephone Encounter (Signed)
Spoke with patient and she states its not as black since she stopped taking the iron. Advised we did referral for GI and she is okay with that and will wait for them to call for an appt. Advised to call back if sx get worse before then.

## 2020-03-21 NOTE — Telephone Encounter (Signed)
FYI: patient is scheduled with [facility] on 04/07/20 at 2:30 pm-first available. Patient is aware. Patient was advised to keep [place] updated as needed before she gets in with GI.

## 2020-03-23 NOTE — Telephone Encounter (Signed)
Noted.  Thanks.  I will await GI input.

## 2020-04-06 NOTE — Progress Notes (Signed)
04/07/2020 [place] [id number] [id number]   CHIEF COMPLAINT: Loose stools.   HISTORY OF PRESENT ILLNESS:  [name] is a 80 year old female with a past medical history of arthritis, osteoporosis, IDA, monoclonal gammopathy, breast cancer 2012 s/p right breast lumpectomy and radiation, MVP, Parkinson's disease diagnosed in 2015 and an esophageal stricture.  Past appendectomy and hysterectomy.  She presents to our office today as referred by Dr. [name] for further evaluation regarding loose stools with associated fecal incontinence. She previously had constipation described as passing rock hard stools once weekly. She is now passing several loose stools daily for the past 2 months.  She has mild intermittent menstrual like lower abdominal cramping.  She reported seeing black tarry like loose stools the end of January 2022 on a few occasions.  No associated Pepto-Bismol use.  No heartburn but she reported having pill dysphagia which usually occurs with larger size pills/tablets.  No upper abdominal pain.  She was advised by Dr. [name] to increase Omeprazole from once daily to twice daily and to discontinue her oral iron tablet and her stools remain dark or brown but no further black-colored stools.  She was previously taking Aleve 3 tabs 3 or 4 times daily for 1 or 2 months for lower back and left hip pain which was discontinued by Dr. [name] 02/27/2020.  She is mostly concerned regarding the leakage of loose stools which is quite frustrating and is interfering with her quality of living.  She drinks 1 glass of wine each evening with her boyfriend.  She eats ice cream several days weekly otherwise limited dairy products.  No fever, sweats or chills.  No weight loss.  She underwent an EGD 03/25/2011 by Dr. Deatra Ina which identified a stricture at the [place] junction which was dilated.  She underwent a colonoscopy 11/03/2009 which was normal.  Colonoscopy 07/01/1999 showed evidence of possible healing  proctosigmoiditis, biopsies showed benign lymphoid aggregates, no colitis.  Colonoscopy 09/05/1986 was normal.  No known family history of colorectal cancer.  Her Parkinson's disease appears to be fairly progressive on Sinemet. When sitting, she is  tremulous and her gait is a bit staggered. When walking, her first few steps appear to be unstable.  She uses a cane or walker at home but left the walker in her car.   CBC Latest Ref Rng & Units 02/25/2020 10/25/2019 10/11/2019  WBC 4.0 - 10.5 K/uL 8.3 6.4 7.6  Hemoglobin 12.0 - 15.0 g/dL 12.6 12.9 12.9  Hematocrit 36.0 - 46.0 % 38.6 38.8 39.1  Platelets 150.0 - 400.0 K/uL 229.0 258.0 198  Iron level 96 on 02/25/2020.  CMP Latest Ref Rng & Units 02/25/2020 10/25/2019 10/11/2019  Glucose 70 - 99 mg/dL 89 105(H) 89  BUN 6 - 23 mg/dL 14 13 13   Creatinine 0.40 - 1.20 mg/dL 0.71 0.73 0.81  Sodium 135 - 145 mEq/L 142 141 142  Potassium 3.5 - 5.1 mEq/L 4.4 4.2 3.8  Chloride 96 - 112 mEq/L 107 106 106  CO2 19 - 32 mEq/L 28 27 28   Calcium 8.4 - 10.5 mg/dL 9.1 9.0 9.2  Total Protein 6.0 - 8.3 g/dL 6.9 6.9 7.5  Total Bilirubin 0.2 - 1.2 mg/dL 0.5 0.5 0.7  Alkaline Phos 39 - 117 U/L 45 60 65  AST 0 - 37 U/L 23 22 27   ALT 0 - 35 U/L 3 13 20     Past Medical History:  Diagnosis Date  . Allergy    SEASONAL  .  Anemia    Anemia-NOS / PMH, Dr Julien Nordmann  . Arthritis   . Breast cancer ([place])    stage I right breast  . Cataract    BILATERAL  . Decreased vision    R eye  . Esophageal Stricture   . Hyperlipidemia   . Monoclonal gammopathy   . MVP (mitral valve prolapse)   . Osteoporosis   . Parkinson disease ([place])   . Skin cancer    basal cell L neck  . Vertical diplopia    Past Surgical History:  Procedure Laterality Date  . APPENDECTOMY  2002  . BONE BIOPSY  11/06/10  . BREAST LUMPECTOMY  02/2010   right breast lumpectomy and sentinel node biopsy  . CARDIAC CATHETERIZATION  2005   neg  . CATARACT EXTRACTION, BILATERAL  2018  . COLONOSCOPY      negative   . HEMORRHOID SURGERY  1970's  . SALPINGOOPHORECTOMY  2002   for benign growths  . SHOULDER SURGERY Right   . TOTAL ABDOMINAL HYSTERECTOMY W/ BILATERAL SALPINGOOPHORECTOMY  80 yrs old   for pain,prolapse ; G3 P2  . UPPER GASTROINTESTINAL ENDOSCOPY     Social History: She is widowed. She has one son and one daughter. Nonsmoker.  She drinks one glass of wine every evening.   Family History: Father deceased in his 79's with hepatitis, further details are unclear.  Mother deceased age 79 from MI, history of with diabetes. She 5 brothers and 3 sisters. No family history esophageal, stomach or colon cancer.   Allergies  Allergen Reactions  . Penicillins Shortness Of Breath and Itching     Outpatient Encounter Medications as of 04/07/2020  Medication Sig  . b complex vitamins tablet Take 1 tablet by mouth daily.  . Calcium 1500 MG tablet Take 1,500 mg by mouth.  . carbidopa-levodopa (SINEMET IR) 25-100 MG tablet 1 1 tablet at 7 AM/ 1/2 tablet at 9, 1/2 tablet at 11 AM/1/2 tablet at 1pm/1/2 tablet at 3 PM/1/2 tablet at 5pm/ 1/2 tablet at 7 PM  . Cholecalciferol 5000 units TABS Take 1 tablet (5,000 Units total) by mouth daily.  . clonazePAM (KLONOPIN) 0.5 MG tablet TAKE 1/2 (ONE-HALF) TABLET BY MOUTH AT BEDTIME  . Denosumab (PROLIA [place]) Inject into the skin every 6 (six) months.  . Elastic Bandages & Supports (ABDOMINAL BINDER/ELASTIC SMALL) MISC 1 Device by Does not apply route daily.  . Multiple Vitamin (MULTIVITAMIN) capsule 2 tabs po qd  . Omega-3 Fatty Acids (OMEGA 3 PO) 1 tab po qd  . omeprazole (PRILOSEC) 20 MG capsule Take 2 capsules (40 mg total) by mouth daily.  . OnabotulinumtoxinA (BOTOX IM) Inject into the muscle every 3 (three) months. Injection every 3 months  . tamoxifen (NOLVADEX) 20 MG tablet TAKE 1 TABLET BY MOUTH ONCE DAILY.  [name] tiZANidine (ZANAFLEX) 4 MG tablet Take 1 tablet (4 mg total) by mouth every 8 (eight) hours as needed for muscle spasms.  [name] venlafaxine XR  (EFFEXOR-XR) 75 MG 24 hr capsule TAKE 1 CAPSULE BY MOUTH ONCE DAILY WITH BREAKFAST  . vitamin B-12 (CYANOCOBALAMIN) 500 MCG tablet Take 2500 mcg daily.   No facility-administered encounter medications on file as of 04/07/2020.    REVIEW OF SYSTEMS: Gen: Denies fever, sweats or chills. No weight loss.  CV: Denies chest pain, palpitations or edema. Resp: Denies cough, shortness of breath of hemoptysis.  GI:See HPI.  GU : Denies urinary burning, blood in urine, increased urinary frequency or incontinence. MS: No new muscle pain. +  lower back and hip pain.  Derm: Denies rash, itchiness, skin lesions or unhealing ulcers. Psych: Denies depression, anxiety or memory loss.  Heme: Denies bruising, bleeding. Neuro:  + Shuffle gait, upper extremity and head tremors.  Endo:  Denies any problems with DM, thyroid or adrenal function.  PHYSICAL EXAM: BP (!) 104/58   Pulse 62   Ht 5\' 4"  (1.626 m)   Wt 126 lb (57.2 kg)   BMI 21.63 kg/m  General: 80 year old female with Parkinson's tremors and shuffle gait in no acute distress. Head: Normocephalic and atraumatic. Eyes:  Sclerae non-icteric, conjunctive pink. Ears: Normal auditory acuity. Mouth: Dentition intact. No ulcers or lesions.  Neck: Supple, no lymphadenopathy or thyromegaly.  Lungs: Clear bilaterally to auscultation without wheezes, crackles or rhonchi. Heart: Regular rate and rhythm. No murmur, rub or gallop appreciated.  Abdomen: Soft, nontender, non distended. No masses. No hepatosplenomegaly. Normoactive bowel sounds x 4 quadrants.  Rectal: Small external hemorrhoids and posterior sentinel tag without fissure. No rectal mass. Soft brown stool guaiac negative. Diminished anal sphincter tone. Estill Bamberg CMA present during exam.  Musculoskeletal: Symmetrical with no gross deformities. Skin: Warm and dry. No rash or lesions on visible extremities. Extremities: No edema. Neurological: Alert oriented x 4, no focal deficits.  Psychological:   Alert and cooperative. Normal mood and affect.  ASSESSMENT AND PLAN:  74.  80 year old female with Parkinson's disease with associated tremors and shuffled gait and chronic constipation presents for further evaluation regarding loose stools, several days stools were black and tarry with recent NSAIDs and fecal incontinence. Intermittent lower abdominal cramping.  A rectal exam today showed loose brown stool guaiac negative. -GI pathogen panel, CBC with differential and BMP -Await the above lab results, if her lower abdominal cramping discomfort worsens will consider CTAP -Lactaid with each dairy product -Benefiber 1 tablespoon daily if tolerated, patient to stop if lower abdominal cramping worsens   -Reduce Omeprazole 20mg  to once daily -Patient to call our office if black stools recur -I will consult with Dr. Havery Moros prior to ordering any endoscopic evaluation EGD +/- colonoscopy  -Follow-up in office in 4 weeks.  Patient to call our office if symptoms worsen. -Eventual rectal therapy for fecal incontinence if symptoms persist  2.  History of IDA. Oral iron discontinued 4 weeks ago. -CBC  3.  Colon cancer screening.  No history of colon polyps.  Last colonoscopy in 2011, no polyps.  4.  History of breast cancer.  5. History of monoclonal gammopathy of unknown significance followed by Dr. Earlie Server  6.  Parkinson's disease on Sinemet.  She has upper extremity and head tremors with a shuffled gait with balance issues. High risk for falls.  Our [place] CMA Estill Bamberg personally escorted the patient to the basement lab and then to her car to ensure a safe departure home.  I advised the patient to use her cane or walker at all times. -Follow-up with neurology        CC:  Tonia Ghent, MD

## 2020-04-07 ENCOUNTER — Other Ambulatory Visit (INDEPENDENT_AMBULATORY_CARE_PROVIDER_SITE_OTHER): Payer: Medicare Other

## 2020-04-07 ENCOUNTER — Encounter: Payer: Self-pay | Admitting: Nurse Practitioner

## 2020-04-07 ENCOUNTER — Ambulatory Visit (INDEPENDENT_AMBULATORY_CARE_PROVIDER_SITE_OTHER): Payer: Medicare Other | Admitting: Nurse Practitioner

## 2020-04-07 VITALS — BP 104/58 | HR 62 | Ht 64.0 in | Wt 126.0 lb

## 2020-04-07 DIAGNOSIS — R197 Diarrhea, unspecified: Secondary | ICD-10-CM | POA: Diagnosis not present

## 2020-04-07 DIAGNOSIS — R1030 Lower abdominal pain, unspecified: Secondary | ICD-10-CM

## 2020-04-07 DIAGNOSIS — R159 Full incontinence of feces: Secondary | ICD-10-CM | POA: Diagnosis not present

## 2020-04-07 LAB — CBC WITH DIFFERENTIAL/PLATELET
Basophils Absolute: 0.1 10*3/uL (ref 0.0–0.1)
Basophils Relative: 1 % (ref 0.0–3.0)
Eosinophils Absolute: 0.3 10*3/uL (ref 0.0–0.7)
Eosinophils Relative: 3.8 % (ref 0.0–5.0)
HCT: 37.3 % (ref 36.0–46.0)
Hemoglobin: 12.6 g/dL (ref 12.0–15.0)
Lymphocytes Relative: 35.6 % (ref 12.0–46.0)
Lymphs Abs: 3 10*3/uL (ref 0.7–4.0)
MCHC: 33.7 g/dL (ref 30.0–36.0)
MCV: 95.8 fl (ref 78.0–100.0)
Monocytes Absolute: 0.5 10*3/uL (ref 0.1–1.0)
Monocytes Relative: 5.8 % (ref 3.0–12.0)
Neutro Abs: 4.5 10*3/uL (ref 1.4–7.7)
Neutrophils Relative %: 53.8 % (ref 43.0–77.0)
Platelets: 228 10*3/uL (ref 150.0–400.0)
RBC: 3.9 Mil/uL (ref 3.87–5.11)
RDW: 14.1 % (ref 11.5–15.5)
WBC: 8.4 10*3/uL (ref 4.0–10.5)

## 2020-04-07 LAB — BASIC METABOLIC PANEL
BUN: 12 mg/dL (ref 6–23)
CO2: 27 mEq/L (ref 19–32)
Calcium: 9.1 mg/dL (ref 8.4–10.5)
Chloride: 105 mEq/L (ref 96–112)
Creatinine, Ser: 0.66 mg/dL (ref 0.40–1.20)
GFR: 83.95 mL/min (ref >60.00)
Glucose, Bld: 83 mg/dL (ref 70–99)
Potassium: 3.7 mEq/L (ref 3.5–5.1)
Sodium: 141 mEq/L (ref 135–145)

## 2020-04-07 NOTE — Patient Instructions (Addendum)
If you are age 79 or older, your body mass index should be between 23-30. Your Body mass index is 21.63 kg/m. If this is out of the aforementioned range listed, please consider follow up with your Primary Care Provider.  LABS:  Lab work has been ordered for you today. Our lab is located in the basement. Press "B" on the elevator. The lab is located at the first door on the left as you exit the elevator.  HEALTHCARE LAWS AND MY CHART RESULTS: Due to recent changes in healthcare laws, you may see the results of your imaging and laboratory studies on MyChart before your provider has had a chance to review them.   We understand that in some cases there may be results that are confusing or concerning to you. Not all laboratory results come back in the same time frame and the provider may be waiting for multiple results in order to interpret others.  Please give [place] 48 hours in order for your provider to thoroughly review all the results before contacting the office for clarification of your results.   OVER THE COUNTER MEDICATION Please purchase the following medications over the counter and take as directed:  Benefiber- 1 tablespoon daily to bulk up stool as tolerated. IBgard twice a day for abdominal cramping. We have given you samples today.  Please decrease Omeprazole to just once a day.  We have scheduled you a follow up with Dr. Havery Moros on 05/23/20 at 9:50 am.  Please call our office if your symptoms worsen. It was great seeing you today! Thank you for entrusting me with your care and choosing [facility].  Noralyn Pick, CRNP

## 2020-04-07 NOTE — Progress Notes (Signed)
Agree with assessment and plan as outlined.  

## 2020-04-10 DIAGNOSIS — H02122 Mechanical ectropion of right lower eyelid: Secondary | ICD-10-CM | POA: Diagnosis not present

## 2020-04-16 ENCOUNTER — Other Ambulatory Visit: Payer: Self-pay

## 2020-04-16 ENCOUNTER — Inpatient Hospital Stay: Payer: Medicare Other | Attending: Internal Medicine

## 2020-04-16 VITALS — BP 120/66 | HR 90 | Resp 18

## 2020-04-16 DIAGNOSIS — Z170 Estrogen receptor positive status [ER+]: Secondary | ICD-10-CM | POA: Insufficient documentation

## 2020-04-16 DIAGNOSIS — Z79899 Other long term (current) drug therapy: Secondary | ICD-10-CM | POA: Diagnosis not present

## 2020-04-16 DIAGNOSIS — D472 Monoclonal gammopathy: Secondary | ICD-10-CM | POA: Insufficient documentation

## 2020-04-16 DIAGNOSIS — C50011 Malignant neoplasm of nipple and areola, right female breast: Secondary | ICD-10-CM | POA: Diagnosis not present

## 2020-04-16 MED ORDER — DENOSUMAB 60 MG/ML ~~LOC~~ SOSY
60.0000 mg | PREFILLED_SYRINGE | Freq: Once | SUBCUTANEOUS | Status: AC
  Administered 2020-04-16: 60 mg via SUBCUTANEOUS

## 2020-04-16 MED ORDER — DENOSUMAB 60 MG/ML ~~LOC~~ SOSY
PREFILLED_SYRINGE | SUBCUTANEOUS | Status: AC
  Filled 2020-04-16: qty 1

## 2020-04-16 NOTE — Progress Notes (Signed)
Assessment/Plan:    1.  Parkinsons Disease  -pt not taking her medication regularly.  States that she is not taking her meds because not keeping her meds regularly with her between her house and her boyfriends and her car.  Told her she needs to keep meds in a necklace on her person.  She will not do well without meds.  Needs to take carbidopa/levodopa 25/100, 1 tablet at 7 AM/ 1/2 tablet at 9, 1/2 tablet at 11 AM/1/2 tablet at 1pm/1/2 tablet at 3 PM/1/2 tablet at 5pm/ 1/2 tablet at 7 PM  -discussed PT  2.  Sialorrhea  -On Xeomin and it is working well.  3.  RBD  -Rarely takes clonazepam, 0.5 mg, half tablet at bed.  PDMP reveals that she has not filled clonazepam since December 24, 2019 and that was on for 15 tablets.  4.  Constipation with what sounds like overflow incontinence  -saw GI NP - Treatment with benefiber recommended  Subjective:   [name] was seen today in follow up for Parkinsons disease.  My previous records were reviewed prior to todays visit as well as outside records available to me.   When I saw the patient for Botox recently, she had tripped over a tree root and had an eye injury.  I subsequently referred her to ophthalmology.  she saw Dr. Manuella Ghazi.  I reviewed those notes.  He recommended evaluation with Dr. Leonard Schwartz, a different ophthalmologist.  I did not get her evaluation from him, but today she states that the other eye doctor never called her but she did follow up with Dr. Manuella Ghazi last week and she was referred to a surgeon to fix the eyelid drooping.  last visit, we discontinued her amantadine because of hallucinations.  We also talked to her about limiting tramadol (had low back pain after a fall).  She is no longer taking that.  Mood has been good.  She saw the nurse practitioner from gastroenterology for constipation, some fecal incontinence and what sounds like black tarry stools.  She was guaiac negative.  Treatment of constipation with fiber was  recommended.  She has had a fall since last visit - fell into furnace and "I bruised my head."  No LOC.  This was about 6 weeks ago.    Current prescribed movement disorder medications: carbidopa/levodopa 25/100, 1 tablet at 7 AM/ 1/2 tablet at 9, 1/2 tablet at 11 AM/1/2 tablet at 1pm/1/2 tablet at 3 PM/1/2 tablet at 5pm/ 1/2 tablet at 7 PM (pt reports not taking it a lot - states that she takes it sometimes and sometimes not - reports that meds may be at her boyfriends house and may be with her.  Right now its in her car and "if its dark and in my car, I don't go there." Clonazepam 0.5 mg, half tablet at bed  PREVIOUS MEDICATIONS: Amantadine (hallucinations); has clonazepam but rarely takes  ALLERGIES:   Allergies  Allergen Reactions  . Penicillins Shortness Of Breath and Itching    CURRENT MEDICATIONS:  Outpatient Encounter Medications as of 04/18/2020  Medication Sig  . b complex vitamins tablet Take 1 tablet by mouth daily.  . Calcium 1500 MG tablet Take 1,500 mg by mouth.  . carbidopa-levodopa (SINEMET IR) 25-100 MG tablet 1 1 tablet at 7 AM/ 1/2 tablet at 9, 1/2 tablet at 11 AM/1/2 tablet at 1pm/1/2 tablet at 3 PM/1/2 tablet at 5pm/ 1/2 tablet at 7 PM (Patient taking differently: 1 tablet at 7  AM/ 1/2 tablet at 9, 1/2 tablet at 11 AM/1/2 tablet at 1pm/1/2 tablet at 3 PM/1/2 tablet at 5pm/ 1/2 tablet at 7 PM)  . Cholecalciferol 5000 units TABS Take 1 tablet (5,000 Units total) by mouth daily.  . clonazePAM (KLONOPIN) 0.5 MG tablet TAKE 1/2 (ONE-HALF) TABLET BY MOUTH AT BEDTIME  . Denosumab (PROLIA [place]) Inject into the skin every 6 (six) months.  . Elastic Bandages & Supports (ABDOMINAL BINDER/ELASTIC SMALL) MISC 1 Device by Does not apply route daily.  . Multiple Vitamin (MULTIVITAMIN) capsule 2 tabs po qd  . Omega-3 Fatty Acids (OMEGA 3 PO) 1 tab po qd  . omeprazole (PRILOSEC) 20 MG capsule Take 2 capsules (40 mg total) by mouth daily.  . OnabotulinumtoxinA (BOTOX IM) Inject into the  muscle every 3 (three) months. Injection every 3 months  . tamoxifen (NOLVADEX) 20 MG tablet TAKE 1 TABLET BY MOUTH ONCE DAILY.  [name] tiZANidine (ZANAFLEX) 4 MG tablet Take 1 tablet (4 mg total) by mouth every 8 (eight) hours as needed for muscle spasms.  [name] venlafaxine XR (EFFEXOR-XR) 75 MG 24 hr capsule TAKE 1 CAPSULE BY MOUTH ONCE DAILY WITH BREAKFAST  . vitamin B-12 (CYANOCOBALAMIN) 500 MCG tablet Take 2500 mcg daily.   No facility-administered encounter medications on file as of 04/18/2020.    Objective:   PHYSICAL EXAMINATION:    VITALS:   Vitals:   04/18/20 0804  BP: 108/66  Pulse: 91  SpO2: 98%  Weight: 124 lb (56.2 kg)  Height: 5\' 4"  (1.626 m)    GEN:  The patient appears stated age and is in NAD. HEENT:  Normocephalic, atraumatic.  The mucous membranes are moist. The superficial temporal arteries are without ropiness or tenderness. CV:  RRR Lungs:  CTAB Neck/HEME:  There are no carotid bruits bilaterally.  Neurological examination:  Orientation: The patient is alert and oriented x3. Cranial nerves: There is good facial symmetry with facial hypomimia. The speech is fluent and mildly dysarthric. Soft palate rises symmetrically and there is no tongue deviation. Hearing is intact to conversational tone. Sensation: Sensation is intact to light touch throughout Motor: Strength is at least antigravity x4.  Movement examination: Tone: There is nl tone in the UE/LE Abnormal movements: there is LUE rest tremor and occasional RLE tremor Coordination:  There is mild decremation with RAM's, with finger taps and toe taps on the L Gait and Station: The patient has mild difficulty arising out of a deep-seated chair without the use of the hands. The patient's stride length is slightly decreased.    I have reviewed and interpreted the following labs independently    Chemistry      Component Value Date/Time   NA 141 04/07/2020 1532   NA 141 10/13/2016 1141   K 3.7 04/07/2020 1532    K 3.6 10/13/2016 1141   CL 105 04/07/2020 1532   CL 107 03/21/2012 1501   CO2 27 04/07/2020 1532   CO2 27 10/13/2016 1141   BUN 12 04/07/2020 1532   BUN 20.0 10/13/2016 1141   CREATININE 0.66 04/07/2020 1532   CREATININE 0.81 10/11/2019 1013   CREATININE 0.8 10/13/2016 1141      Component Value Date/Time   CALCIUM 9.1 04/07/2020 1532   CALCIUM 9.5 10/13/2016 1141   ALKPHOS 45 02/25/2020 1153   ALKPHOS 50 10/13/2016 1141   AST 23 02/25/2020 1153   AST 27 10/11/2019 1013   AST 25 10/13/2016 1141   ALT 3 02/25/2020 1153   ALT 20 10/11/2019 1013  ALT 17 10/13/2016 1141   BILITOT 0.5 02/25/2020 1153   BILITOT 0.7 10/11/2019 1013   BILITOT 0.27 10/13/2016 1141       Lab Results  Component Value Date   WBC 8.4 04/07/2020   HGB 12.6 04/07/2020   HCT 37.3 04/07/2020   MCV 95.8 04/07/2020   PLT 228.0 04/07/2020    Lab Results  Component Value Date   TSH 4.75 (H) 10/25/2019     Total time spent on today's visit was 30 minutes, including both face-to-face time and nonface-to-face time.  Time included that spent on review of records (prior notes available to me/labs/imaging if pertinent), discussing treatment and goals, answering patient's questions and coordinating care.  Cc:  Tonia Ghent, MD

## 2020-04-18 ENCOUNTER — Ambulatory Visit (INDEPENDENT_AMBULATORY_CARE_PROVIDER_SITE_OTHER): Payer: Medicare Other | Admitting: Neurology

## 2020-04-18 ENCOUNTER — Encounter: Payer: Self-pay | Admitting: Neurology

## 2020-04-18 ENCOUNTER — Other Ambulatory Visit: Payer: Self-pay

## 2020-04-18 VITALS — BP 108/66 | HR 91 | Ht 64.0 in | Wt 124.0 lb

## 2020-04-18 DIAGNOSIS — G20 Parkinson's disease: Secondary | ICD-10-CM | POA: Diagnosis not present

## 2020-04-18 NOTE — Patient Instructions (Addendum)
1.  You need to take your Parkinsons Disease medications!  Get a necklace that you put your medications in and put your carbidopa/levodopa 25/100 in there.    2.  We will refer you back to [facility] on [address]  3.  Use the goodRx card instead of your medicare to pay for your klonopin.  It looks like its cheapest at [employer].

## 2020-04-21 DIAGNOSIS — Z1231 Encounter for screening mammogram for malignant neoplasm of breast: Secondary | ICD-10-CM | POA: Diagnosis not present

## 2020-04-21 LAB — HM MAMMOGRAPHY

## 2020-05-14 ENCOUNTER — Other Ambulatory Visit: Payer: Self-pay

## 2020-05-14 DIAGNOSIS — G20 Parkinson's disease: Secondary | ICD-10-CM

## 2020-05-16 ENCOUNTER — Ambulatory Visit (INDEPENDENT_AMBULATORY_CARE_PROVIDER_SITE_OTHER): Payer: Medicare Other | Admitting: Neurology

## 2020-05-16 ENCOUNTER — Other Ambulatory Visit: Payer: Self-pay

## 2020-05-16 DIAGNOSIS — K117 Disturbances of salivary secretion: Secondary | ICD-10-CM | POA: Diagnosis not present

## 2020-05-16 MED ORDER — INCOBOTULINUMTOXINA 100 UNITS IM SOLR
100.0000 [IU] | Freq: Once | INTRAMUSCULAR | Status: AC
  Administered 2020-05-16: 100 [IU] via INTRAMUSCULAR

## 2020-05-16 NOTE — Procedures (Signed)
Botulinum Clinic   History:  Diagnosis: Sialorrhea associated with PD (icd10: K11.7)    Informed consent was obtained.  Discussed differences between myobloc and xeomin.  The patient was educated on the botulinum toxin the black blox warning and given a copy of the botox patient medication guide.  The patient understands that this warning states that there have been reported cases of the Botox extending beyond the injection site and creating adverse effects, similar to those of botulism. This included loss of strength, trouble walking, hoarseness, trouble saying words clearly, loss of bladder control, trouble breathing, trouble swallowing, diplopia, blurry vision and ptosis. Most of the distant spread of Botox was happening in patients, primarily children, who received medication for spasticity or for cervical dystonia. The patient expressed understanding and desire to proceed.   Injections  Location Left  Right Units Number of sites  Parotid (point 1 on picture) 30 30 60 1 per side  Submandibular (point 2) 20 20 40 1 per side  TOTAL UNITS:   100    Type of Toxin: Xeomin Discarded Units: 0  Needle drawback with each injection was free of blood. Pt tolerated procedure well without complications.   Reinjection is anticipated in 4 months.

## 2020-05-23 ENCOUNTER — Ambulatory Visit (INDEPENDENT_AMBULATORY_CARE_PROVIDER_SITE_OTHER): Payer: Medicare Other | Admitting: Gastroenterology

## 2020-05-23 ENCOUNTER — Encounter: Payer: Self-pay | Admitting: Gastroenterology

## 2020-05-23 VITALS — BP 104/58 | HR 96 | Ht 64.0 in | Wt 120.4 lb

## 2020-05-23 DIAGNOSIS — R109 Unspecified abdominal pain: Secondary | ICD-10-CM | POA: Diagnosis not present

## 2020-05-23 DIAGNOSIS — R194 Change in bowel habit: Secondary | ICD-10-CM | POA: Diagnosis not present

## 2020-05-23 NOTE — Patient Instructions (Signed)
Continue fiber daily. IB Gard as needed Imodium as needed  Low FODMAP diet  Please contact the office if you have any recurrence of symptoms

## 2020-05-23 NOTE — Progress Notes (Signed)
HPI :  80 year old female here for a follow-up visit with me for altered bowel habits.  It was my first time meeting her, she was last seen by Carl Best March 14 in our office.   She has a history of breast cancer remotely, history of Parkinson's disease.  She had seen [place] previously for change in bowel habits.  She had constipation several months ago and then her stools changed to more loose in nature with urgency and some episodes of fecal incontinence.  There was a concern at one point time also about seeing black and tarry stools.  She states she had some medicine changes since that set of symptoms, her dose of Sinemet was adjusted, she is not sure if that played a role in things.  At her last visit she had blood work done which showed no evidence of any anemia, her hemoglobin was normal, white blood cell count normal.  She was asked to submit a GI pathogen panel but never did so.  She was given Lactaid to use with dairy products, told to take Benefiber daily and to contact us if dark tarry stools recurred.  She previously was using NSAIDs earlier in the year but stopped using them.  Her dark stools were in January and she has not seen that since then.  She is taking omeprazole for her history of reflux.  Generally since her last visit she is doing better.  Her stool frequency is not as bad, her stools are forming up and she thinks the regimen is definitely helped.  Her stools are brown in color without blood.  She has been using some IBgard as well for some abdominal cramping and see states that has helped.  She denies any family history of colon cancer.  We discussed how aggressive she want to be with her work-up at this time, she understands colonoscopy would be extremely challenging for her to have her issues with Parkinson's   Prior work-up: EGD 03/25/2011 by Dr. Deatra Ina which identified a stricture at the [place] junction which was dilated.    Colonoscopy 11/03/2009 normal.    Colonoscopy 07/01/1999 showed evidence of possible healing proctosigmoiditis, biopsies showed benign lymphoid aggregates, no colitis.   Colonoscopy 09/05/1986 was normal.     Past Medical History:  Diagnosis Date  . Allergy    SEASONAL  . Anemia    Anemia-NOS / PMH, Dr Julien Nordmann  . Arthritis   . Breast cancer ([place])    stage I right breast  . Cataract    BILATERAL  . Decreased vision    R eye  . Esophageal Stricture   . Hyperlipidemia   . Monoclonal gammopathy   . MVP (mitral valve prolapse)   . Osteoporosis   . Parkinson disease ([place])   . Skin cancer    basal cell L neck  . Vertical diplopia      Past Surgical History:  Procedure Laterality Date  . APPENDECTOMY  2002  . BONE BIOPSY  11/06/10  . BREAST LUMPECTOMY  02/2010   right breast lumpectomy and sentinel node biopsy  . CARDIAC CATHETERIZATION  2005   neg  . CATARACT EXTRACTION, BILATERAL  2018  . COLONOSCOPY     negative   . HEMORRHOID SURGERY  1970's  . SALPINGOOPHORECTOMY  2002   for benign growths  . SHOULDER SURGERY Right   . TOTAL ABDOMINAL HYSTERECTOMY W/ BILATERAL SALPINGOOPHORECTOMY  80 yrs old   for pain,prolapse ; G3 P2  . UPPER  GASTROINTESTINAL ENDOSCOPY     Family History  Problem Relation Age of Onset  . Diabetes Mother   . Heart disease Mother   . Diabetes Brother   . Heart disease Brother   . Hepatitis Father   . Diabetes Brother   . Diabetes Brother   . Breast cancer Neg Hx   . Colon cancer Neg Hx    Social History   Tobacco Use  . Smoking status: Never Smoker  . Smokeless tobacco: Never Used  Vaping Use  . Vaping Use: Never used  Substance Use Topics  . Alcohol use: Not Currently    Alcohol/week: 1.0 standard drink    Types: 1 Glasses of wine per week  . Drug use: No   Current Outpatient Medications  Medication Sig Dispense Refill  . b complex vitamins tablet Take 1 tablet by mouth daily.    . Calcium 1500 MG tablet Take 1,500 mg by mouth.    . carbidopa-levodopa (SINEMET  IR) 25-100 MG tablet 1 1 tablet at 7 AM/ 1/2 tablet at 9, 1/2 tablet at 11 AM/1/2 tablet at 1pm/1/2 tablet at 3 PM/1/2 tablet at 5pm/ 1/2 tablet at 7 PM (Patient taking differently: 1 tablet at 7 AM/ 1/2 tablet at 9, 1/2 tablet at 11 AM/1/2 tablet at 1pm/1/2 tablet at 3 PM/1/2 tablet at 5pm/ 1/2 tablet at 7 PM) 360 tablet 1  . Cholecalciferol 5000 units TABS Take 1 tablet (5,000 Units total) by mouth daily.    . clonazePAM (KLONOPIN) 0.5 MG tablet TAKE 1/2 (ONE-HALF) TABLET BY MOUTH AT BEDTIME 45 tablet 0  . Denosumab (PROLIA [place]) Inject into the skin every 6 (six) months.    . Elastic Bandages & Supports (ABDOMINAL BINDER/ELASTIC SMALL) MISC 1 Device by Does not apply route daily. 1 each 0  . Multiple Vitamin (MULTIVITAMIN) capsule 2 tabs po qd    . Omega-3 Fatty Acids (OMEGA 3 PO) 1 tab po qd    . omeprazole (PRILOSEC) 20 MG capsule Take 2 capsules (40 mg total) by mouth daily.    . OnabotulinumtoxinA (BOTOX IM) Inject into the muscle every 3 (three) months. Injection every 3 months    . tamoxifen (NOLVADEX) 20 MG tablet TAKE 1 TABLET BY MOUTH ONCE DAILY. 90 tablet 0  . tiZANidine (ZANAFLEX) 4 MG tablet Take 1 tablet (4 mg total) by mouth every 8 (eight) hours as needed for muscle spasms. 60 tablet 1  . venlafaxine XR (EFFEXOR-XR) 75 MG 24 hr capsule TAKE 1 CAPSULE BY MOUTH ONCE DAILY WITH BREAKFAST 90 capsule 0  . vitamin B-12 (CYANOCOBALAMIN) 500 MCG tablet Take 2500 mcg daily.     No current facility-administered medications for this visit.   Allergies  Allergen Reactions  . Penicillins Shortness Of Breath and Itching     Review of Systems: All systems reviewed and negative except where noted in HPI.   Lab Results  Component Value Date   WBC 8.4 04/07/2020   HGB 12.6 04/07/2020   HCT 37.3 04/07/2020   MCV 95.8 04/07/2020   PLT 228.0 04/07/2020    Lab Results  Component Value Date   CREATININE 0.66 04/07/2020   BUN 12 04/07/2020   NA 141 04/07/2020   K 3.7 04/07/2020   CL  105 04/07/2020   CO2 27 04/07/2020    Lab Results  Component Value Date   ALT 3 02/25/2020   AST 23 02/25/2020   ALKPHOS 45 02/25/2020   BILITOT 0.5 02/25/2020     Physical Exam: BP [name])  104/58   Pulse 96   Ht 5\' 4"  (1.626 m)   Wt 120 lb 6.4 oz (54.6 kg)   BMI 20.67 kg/m  Constitutional: Pleasant,well-developed, female in no acute distress. Neurological: Alert and oriented to person place and time. Skin: Skin is warm and dry. No rashes noted. Psychiatric: Normal mood and affect. Behavior is normal.   ASSESSMENT AND PLAN: 80 year old female here for reassessment of the following:  Bowel habit changes Abdominal cramps  History as above, previously was having constipation and then loose stools with incontinence.  Labs reassuring.  Never submitted her GI pathogen panel.  On bowel regimen with fiber daily and using IBgard as needed and she is doing much better than previous.  Still has occasional loose stool but in general has been much happier than previous, she would rather have more frequent, softer/looser stool than constipation, although does not sound like she had overflow incontinence.  We discussed how aggressive she want to be at this point with her work-up, i.e. colonoscopy.  She is wanting to avoid that if possible given she is feeling better.  Wants to continue with fiber supplement daily, can continue with IBgard if it is helping her.  She inquires about dietary ways to manage this, I counseled her on a low FODMAP diet will see if that helps.  She can also try low-dose Imodium as needed moving forward although she is fearful of constipation.  She can follow-up with [place] as needed moving forward on her course.  She agreed.  She will continue to use Lactaid as needed  Plan: - continue fiber daily - IB gard PRN - counseled on and handout on low FODMAP diet - immodium PRN - lactaid PRN - patients wishes to hold off on colonoscopy and further workup since she is  improving   Branson Cellar, MD [facility]

## 2020-06-17 ENCOUNTER — Other Ambulatory Visit: Payer: Self-pay | Admitting: Neurology

## 2020-06-17 ENCOUNTER — Other Ambulatory Visit: Payer: Self-pay

## 2020-06-17 NOTE — Telephone Encounter (Signed)
Call patient and find out if taking med.  Last filled 11/2019 and that was only for 15 tablets.  Curious as to why needs it now?

## 2020-06-18 NOTE — Telephone Encounter (Signed)
Patient states that she needs a refill on her medication Carbidopa levodopa and the clonazepam  Called in to the [facility]

## 2020-06-19 MED ORDER — CLONAZEPAM 0.5 MG PO TABS: ORAL_TABLET | ORAL | 0 refills | Status: DC

## 2020-06-19 MED ORDER — CARBIDOPA-LEVODOPA 25-100 MG PO TABS: ORAL_TABLET | ORAL | 1 refills | Status: DC

## 2020-06-19 NOTE — Telephone Encounter (Signed)
Called and spoke to patient and she informed me that she has gotten away from taking her medications and wants to start again. She will be using the necklace that is recommended by Dr. Carles Collet. Patient stated she needs her clonazepam and Carbidopa/Levodopa filled. She takes her Carbidopa/Levodopa 1 tablet at 7 AM/ 1/2 tablet at 9, 1/2 tablet at 11 AM/1/2 tablet at 1pm/1/2 tablet at 3 PM/1/2 tablet at 5pm/ 1/2 tablet at 7 PM.

## 2020-06-20 DIAGNOSIS — Z23 Encounter for immunization: Secondary | ICD-10-CM | POA: Diagnosis not present

## 2020-09-10 NOTE — Progress Notes (Signed)
Assessment/Plan:    1.  Parkinsons Disease  -Continue carbidopa/levodopa 25/100, 1 tablet at 7 AM/ 1/2 tablet at 9, 1/2 tablet at 11 AM/1/2 tablet at 1pm/1/2 tablet at 3 PM/1/2 tablet at 5pm/ 1/2 tablet at 7 PM  -Patient has Parkinson's dyskinesia, but not bothersome to her.  Would not recommend treatment, given that treatment risks outweigh benefits.   2.  Sialorrhea  -On Xeomin and it is working well.  3.  RBD  -Using clonazepam, 0.5 mg, half tablet at bed.  PDMP reviewed and refilled.  4.  Constipation with what sounds like overflow incontinence  -saw GI NP - Treatment with benefiber recommended and helping  5.  Depression  -Has been out of her venlafaxine for over 1 month.  Sent a note to primary care to let him know that she needs a refill.  Subjective:   [name] was seen today in follow up for Parkinsons disease.  My previous records were reviewed prior to todays visit as well as outside records available to me.   I talked with the patient last visit, she really was not taking her medications regularly, because she was traveling between her house and her boyfriend's house.  We talked about getting a necklace or something similar to help her carry her medications with her.  She states that "the necklace made a huge difference."  No falls.  No hallucinations.  Mood is "okay" - out of effexor x 1 month.    Current prescribed movement disorder medications: carbidopa/levodopa 25/100, 1 tablet at 7 AM/ 1/2 tablet at 9, 1/2 tablet at 11 AM/1/2 tablet at 1pm/1/2 tablet at 3 PM/1/2 tablet at 5pm/ 1/2 tablet at 7 PM  Clonazepam 0.5 mg, half tablet at bed  PREVIOUS MEDICATIONS: Amantadine (hallucinations); has clonazepam but rarely takes  ALLERGIES:   Allergies  Allergen Reactions   Penicillins Shortness Of Breath and Itching    CURRENT MEDICATIONS:  Outpatient Encounter Medications as of 09/11/2020  Medication Sig   b complex vitamins tablet Take 1 tablet by mouth  daily.   Calcium 1500 MG tablet Take 1,500 mg by mouth.   carbidopa-levodopa (SINEMET IR) 25-100 MG tablet 1 tablet at 7 AM/ 1/2 tablet at 9, 1/2 tablet at 11 AM/1/2 tablet at 1pm/1/2 tablet at 3 PM/1/2 tablet at 5pm/ 1/2 tablet at 7 PM   Cholecalciferol 5000 units TABS Take 1 tablet (5,000 Units total) by mouth daily.   clonazePAM (KLONOPIN) 0.5 MG tablet TAKE 1/2 (ONE-HALF) TABLET BY MOUTH AT BEDTIME   Denosumab (PROLIA [place]) Inject into the skin every 6 (six) months.   Elastic Bandages & Supports (ABDOMINAL BINDER/ELASTIC SMALL) MISC 1 Device by Does not apply route daily.   Multiple Vitamin (MULTIVITAMIN) capsule 2 tabs po qd   Omega-3 Fatty Acids (OMEGA 3 PO) 1 tab po qd   omeprazole (PRILOSEC) 20 MG capsule Take 2 capsules (40 mg total) by mouth daily.   OnabotulinumtoxinA (BOTOX IM) Inject into the muscle every 3 (three) months. Injection every 3 months   tamoxifen (NOLVADEX) 20 MG tablet TAKE 1 TABLET BY MOUTH ONCE DAILY.   tiZANidine (ZANAFLEX) 4 MG tablet Take 1 tablet (4 mg total) by mouth every 8 (eight) hours as needed for muscle spasms.   venlafaxine XR (EFFEXOR-XR) 75 MG 24 hr capsule TAKE 1 CAPSULE BY MOUTH ONCE DAILY WITH BREAKFAST   vitamin B-12 (CYANOCOBALAMIN) 500 MCG tablet Take 2500 mcg daily.   No facility-administered encounter medications on file as of 09/11/2020.  Objective:   PHYSICAL EXAMINATION:    VITALS:   Vitals:   09/11/20 0830  BP: 132/68  Pulse: 82  SpO2: 98%  Weight: 116 lb 6.4 oz (52.8 kg)  Height: '5\' 4"'$  (1.626 m)     GEN:  The patient appears stated age and is in NAD. HEENT:  Normocephalic, atraumatic.  The mucous membranes are moist. The superficial temporal arteries are without ropiness or tenderness. CV:  RRR Lungs:  CTAB Neck/HEME:  There are no carotid bruits bilaterally.  Neurological examination:  Orientation: The patient is alert and oriented x3. Cranial nerves: There is good facial symmetry with facial hypomimia. The speech is  fluent and mildly dysarthric and hypophonic. Soft palate rises symmetrically and there is no tongue deviation. Hearing is intact to conversational tone. Sensation: Sensation is intact to light touch throughout Motor: Strength is at least antigravity x4.  Movement examination: Tone: There is nl tone in the UE/LE Abnormal movements: there is dyskinesia on the L Coordination:  There is mild decremation with RAM's, with finger taps and toe taps on the L Gait and Station: The patient has mild difficulty arising out of a deep-seated chair without the use of the hands. The patient's stride length is slightly decreased.  She ambulates with her cane.  I have reviewed and interpreted the following labs independently    Chemistry      Component Value Date/Time   NA 141 04/07/2020 1532   NA 141 10/13/2016 1141   K 3.7 04/07/2020 1532   K 3.6 10/13/2016 1141   CL 105 04/07/2020 1532   CL 107 03/21/2012 1501   CO2 27 04/07/2020 1532   CO2 27 10/13/2016 1141   BUN 12 04/07/2020 1532   BUN 20.0 10/13/2016 1141   CREATININE 0.66 04/07/2020 1532   CREATININE 0.81 10/11/2019 1013   CREATININE 0.8 10/13/2016 1141      Component Value Date/Time   CALCIUM 9.1 04/07/2020 1532   CALCIUM 9.5 10/13/2016 1141   ALKPHOS 45 02/25/2020 1153   ALKPHOS 50 10/13/2016 1141   AST 23 02/25/2020 1153   AST 27 10/11/2019 1013   AST 25 10/13/2016 1141   ALT 3 02/25/2020 1153   ALT 20 10/11/2019 1013   ALT 17 10/13/2016 1141   BILITOT 0.5 02/25/2020 1153   BILITOT 0.7 10/11/2019 1013   BILITOT 0.27 10/13/2016 1141       Lab Results  Component Value Date   WBC 8.4 04/07/2020   HGB 12.6 04/07/2020   HCT 37.3 04/07/2020   MCV 95.8 04/07/2020   PLT 228.0 04/07/2020    Lab Results  Component Value Date   TSH 4.75 (H) 10/25/2019     Total time spent on today's visit was 20 minutes, including both face-to-face time and nonface-to-face time.  Time included that spent on review of records (prior notes  available to me/labs/imaging if pertinent), discussing treatment and goals, answering patient's questions and coordinating care.  Cc:  Tonia Ghent, MD

## 2020-09-11 ENCOUNTER — Other Ambulatory Visit: Payer: Self-pay

## 2020-09-11 ENCOUNTER — Ambulatory Visit (INDEPENDENT_AMBULATORY_CARE_PROVIDER_SITE_OTHER): Payer: Medicare Other | Admitting: Neurology

## 2020-09-11 ENCOUNTER — Encounter: Payer: Self-pay | Admitting: Neurology

## 2020-09-11 VITALS — BP 132/68 | HR 82 | Ht 64.0 in | Wt 116.4 lb

## 2020-09-11 DIAGNOSIS — F330 Major depressive disorder, recurrent, mild: Secondary | ICD-10-CM | POA: Diagnosis not present

## 2020-09-11 DIAGNOSIS — G20 Parkinson's disease: Secondary | ICD-10-CM

## 2020-09-11 MED ORDER — CLONAZEPAM 0.5 MG PO TABS: ORAL_TABLET | ORAL | 1 refills | Status: DC

## 2020-09-14 ENCOUNTER — Other Ambulatory Visit: Payer: Self-pay | Admitting: Family Medicine

## 2020-09-14 MED ORDER — VENLAFAXINE HCL ER 75 MG PO CP24: 75.0000 mg | ORAL_CAPSULE | Freq: Every day | ORAL | 1 refills | Status: DC

## 2020-09-17 ENCOUNTER — Other Ambulatory Visit: Payer: Self-pay | Admitting: Internal Medicine

## 2020-09-17 DIAGNOSIS — C50919 Malignant neoplasm of unspecified site of unspecified female breast: Secondary | ICD-10-CM

## 2020-09-24 ENCOUNTER — Ambulatory Visit: Payer: Medicare Other | Admitting: Neurology

## 2020-09-26 ENCOUNTER — Ambulatory Visit (INDEPENDENT_AMBULATORY_CARE_PROVIDER_SITE_OTHER): Payer: Medicare Other | Admitting: Neurology

## 2020-09-26 ENCOUNTER — Other Ambulatory Visit: Payer: Self-pay

## 2020-09-26 DIAGNOSIS — K117 Disturbances of salivary secretion: Secondary | ICD-10-CM

## 2020-09-26 MED ORDER — INCOBOTULINUMTOXINA 100 UNITS IM SOLR
100.0000 [IU] | INTRAMUSCULAR | Status: DC
  Administered 2020-09-26: 100 [IU] via INTRAMUSCULAR

## 2020-09-26 NOTE — Procedures (Signed)
Botulinum Clinic   History:  Diagnosis: Sialorrhea associated with PD (icd10: K11.7)    Informed consent was obtained.  Discussed differences between myobloc and xeomin.  The patient was educated on the botulinum toxin the black blox warning and given a copy of the botox patient medication guide.  The patient understands that this warning states that there have been reported cases of the Botox extending beyond the injection site and creating adverse effects, similar to those of botulism. This included loss of strength, trouble walking, hoarseness, trouble saying words clearly, loss of bladder control, trouble breathing, trouble swallowing, diplopia, blurry vision and ptosis. Most of the distant spread of Botox was happening in patients, primarily children, who received medication for spasticity or for cervical dystonia. The patient expressed understanding and desire to proceed.   Injections  Location Left  Right Units Number of sites  Parotid (point 1 on picture) 30 30 60 1 per side  Submandibular (point 2) 20 20 40 1 per side  TOTAL UNITS:   100    Type of Toxin: Xeomin Discarded Units: 0  Needle drawback with each injection was free of blood. Pt tolerated procedure well without complications.   Reinjection is anticipated in 4 months.

## 2020-10-06 ENCOUNTER — Other Ambulatory Visit: Payer: Self-pay | Admitting: Neurology

## 2020-10-06 DIAGNOSIS — G20 Parkinson's disease: Secondary | ICD-10-CM

## 2020-10-10 ENCOUNTER — Inpatient Hospital Stay: Payer: Medicare Other | Attending: Internal Medicine

## 2020-10-10 ENCOUNTER — Other Ambulatory Visit: Payer: Self-pay

## 2020-10-10 DIAGNOSIS — R5383 Other fatigue: Secondary | ICD-10-CM | POA: Insufficient documentation

## 2020-10-10 DIAGNOSIS — D472 Monoclonal gammopathy: Secondary | ICD-10-CM | POA: Insufficient documentation

## 2020-10-10 DIAGNOSIS — Z79899 Other long term (current) drug therapy: Secondary | ICD-10-CM | POA: Insufficient documentation

## 2020-10-10 DIAGNOSIS — Z79810 Long term (current) use of selective estrogen receptor modulators (SERMs): Secondary | ICD-10-CM | POA: Insufficient documentation

## 2020-10-10 DIAGNOSIS — G20 Parkinson's disease: Secondary | ICD-10-CM | POA: Insufficient documentation

## 2020-10-10 DIAGNOSIS — Z8719 Personal history of other diseases of the digestive system: Secondary | ICD-10-CM | POA: Diagnosis not present

## 2020-10-10 DIAGNOSIS — Z880 Allergy status to penicillin: Secondary | ICD-10-CM | POA: Insufficient documentation

## 2020-10-10 DIAGNOSIS — Z853 Personal history of malignant neoplasm of breast: Secondary | ICD-10-CM | POA: Insufficient documentation

## 2020-10-10 DIAGNOSIS — Z90722 Acquired absence of ovaries, bilateral: Secondary | ICD-10-CM | POA: Diagnosis not present

## 2020-10-10 DIAGNOSIS — Z9049 Acquired absence of other specified parts of digestive tract: Secondary | ICD-10-CM | POA: Diagnosis not present

## 2020-10-10 DIAGNOSIS — Z85828 Personal history of other malignant neoplasm of skin: Secondary | ICD-10-CM | POA: Diagnosis not present

## 2020-10-10 DIAGNOSIS — Z923 Personal history of irradiation: Secondary | ICD-10-CM | POA: Insufficient documentation

## 2020-10-10 LAB — CBC WITH DIFFERENTIAL (CANCER CENTER ONLY)
Abs Immature Granulocytes: 0.01 10*3/uL (ref 0.00–0.07)
Basophils Absolute: 0 10*3/uL (ref 0.0–0.1)
Basophils Relative: 1 %
Eosinophils Absolute: 0.3 10*3/uL (ref 0.0–0.5)
Eosinophils Relative: 4 %
HCT: 37.8 % (ref 36.0–46.0)
Hemoglobin: 12.1 g/dL (ref 12.0–15.0)
Immature Granulocytes: 0 %
Lymphocytes Relative: 36 %
Lymphs Abs: 2.5 10*3/uL (ref 0.7–4.0)
MCH: 32 pg (ref 26.0–34.0)
MCHC: 32 g/dL (ref 30.0–36.0)
MCV: 100 fL (ref 80.0–100.0)
Monocytes Absolute: 0.4 10*3/uL (ref 0.1–1.0)
Monocytes Relative: 6 %
Neutro Abs: 3.6 10*3/uL (ref 1.7–7.7)
Neutrophils Relative %: 53 %
Platelet Count: 211 10*3/uL (ref 150–400)
RBC: 3.78 MIL/uL — ABNORMAL LOW (ref 3.87–5.11)
RDW: 13.8 % (ref 11.5–15.5)
WBC Count: 6.8 10*3/uL (ref 4.0–10.5)
nRBC: 0 % (ref 0.0–0.2)

## 2020-10-10 LAB — CMP (CANCER CENTER ONLY)
ALT: <6 U/L (ref 0–44)
AST: 20 U/L (ref 15–41)
Albumin: 3.6 g/dL (ref 3.5–5.0)
Alkaline Phosphatase: 49 U/L (ref 38–126)
Anion gap: 9 (ref 5–15)
BUN: 13 mg/dL (ref 8–23)
CO2: 27 mmol/L (ref 22–32)
Calcium: 9.2 mg/dL (ref 8.9–10.3)
Chloride: 107 mmol/L (ref 98–111)
Creatinine: 0.73 mg/dL (ref 0.44–1.00)
GFR, Estimated: >60 mL/min (ref >60)
Glucose, Bld: 100 mg/dL — ABNORMAL HIGH (ref 70–99)
Potassium: 4 mmol/L (ref 3.5–5.1)
Sodium: 143 mmol/L (ref 135–145)
Total Bilirubin: 0.5 mg/dL (ref 0.3–1.2)
Total Protein: 7.1 g/dL (ref 6.5–8.1)

## 2020-10-10 LAB — LACTATE DEHYDROGENASE: LDH: 195 U/L — ABNORMAL HIGH (ref 98–192)

## 2020-10-11 LAB — BETA 2 MICROGLOBULIN, SERUM: Beta-2 Microglobulin: 1.4 mg/L (ref 0.6–2.4)

## 2020-10-11 LAB — IGG, IGA, IGM
IgA: 36 mg/dL — ABNORMAL LOW (ref 64–422)
IgG (Immunoglobin G), Serum: 1776 mg/dL — ABNORMAL HIGH (ref 586–1602)
IgM (Immunoglobulin M), Srm: 21 mg/dL — ABNORMAL LOW (ref 26–217)

## 2020-10-13 LAB — KAPPA/LAMBDA LIGHT CHAINS
Kappa free light chain: 118 mg/L — ABNORMAL HIGH (ref 3.3–19.4)
Kappa, lambda light chain ratio: 16.86 — ABNORMAL HIGH (ref 0.26–1.65)
Lambda free light chains: 7 mg/L (ref 5.7–26.3)

## 2020-10-16 ENCOUNTER — Other Ambulatory Visit: Payer: Self-pay

## 2020-10-16 ENCOUNTER — Inpatient Hospital Stay (HOSPITAL_BASED_OUTPATIENT_CLINIC_OR_DEPARTMENT_OTHER): Payer: Medicare Other | Admitting: Internal Medicine

## 2020-10-16 ENCOUNTER — Inpatient Hospital Stay: Payer: Medicare Other

## 2020-10-16 VITALS — BP 116/84 | HR 88 | Temp 97.0°F | Resp 18 | Wt 110.4 lb

## 2020-10-16 DIAGNOSIS — D472 Monoclonal gammopathy: Secondary | ICD-10-CM | POA: Diagnosis not present

## 2020-10-16 DIAGNOSIS — Z79899 Other long term (current) drug therapy: Secondary | ICD-10-CM | POA: Diagnosis not present

## 2020-10-16 DIAGNOSIS — G20 Parkinson's disease: Secondary | ICD-10-CM | POA: Diagnosis not present

## 2020-10-16 DIAGNOSIS — C50011 Malignant neoplasm of nipple and areola, right female breast: Secondary | ICD-10-CM

## 2020-10-16 DIAGNOSIS — R5383 Other fatigue: Secondary | ICD-10-CM | POA: Diagnosis not present

## 2020-10-16 DIAGNOSIS — Z79810 Long term (current) use of selective estrogen receptor modulators (SERMs): Secondary | ICD-10-CM | POA: Diagnosis not present

## 2020-10-16 DIAGNOSIS — Z853 Personal history of malignant neoplasm of breast: Secondary | ICD-10-CM | POA: Diagnosis not present

## 2020-10-16 MED ORDER — DENOSUMAB 60 MG/ML ~~LOC~~ SOSY
60.0000 mg | PREFILLED_SYRINGE | Freq: Once | SUBCUTANEOUS | Status: AC
  Administered 2020-10-16: 60 mg via SUBCUTANEOUS
  Filled 2020-10-16: qty 1

## 2020-10-16 NOTE — Progress Notes (Signed)
[facility] Telephone:([phone]) [phone]   Fax:([phone]  OFFICE PROGRESS NOTE  Tonia Ghent, MD [name] [address]  DIAGNOSIS:  1) Node-negative breast cancer status post completion of radiation 05/06/2010 currently on tamoxifen as well as prolia.  2) Monoclonal gammopathy of unknown significance on observation since 2007   PRIOR THERAPY:  1) status post right lumpectomy on 03/05/2010 and it showed invasive ductal carcinoma 0.9 CM, Positive for ER/PR and negative for HER-2, with ductal carcinoma in situ present and negative sentinel lymph node biopsies.  2) status post adjuvant radiotherapy completed in April of 2012   CURRENT THERAPY:  1) tamoxifen 20 mg by mouth daily started in 2012.  2) Prolia subcutaneous injection every 6 months.  INTERVAL HISTORY: [name] 80 y.o. female returns to the clinic today for annual follow-up visit.  The patient is feeling fine today with no concerning complaints except for the generalized tremor from her Parkinson's disease.  She is currently on treatment under the care of Dr. Carles Collet with neurology.  She is currently on carbidopa/levodopa.  She denied having any current chest pain, shortness of breath, cough or hemoptysis.  She denied having any fever or chills.  She has no nausea, vomiting, diarrhea or constipation.  She has no weight loss or night sweats.  She has been on tamoxifen for her history of breast cancer since 2012. The patient is here today for evaluation and repeat myeloma panel.  MEDICAL HISTORY: Past Medical History:  Diagnosis Date   Allergy    SEASONAL   Anemia    Anemia-NOS / PMH, Dr Julien Nordmann   Arthritis    Breast cancer ([place])    stage I right breast   Cataract    BILATERAL   Decreased vision    R eye   Esophageal Stricture    Hyperlipidemia    Monoclonal gammopathy    MVP (mitral valve prolapse)    Osteoporosis    Parkinson disease ([facility])    Skin cancer    basal  cell L neck   Vertical diplopia     ALLERGIES:  is allergic to penicillins.  MEDICATIONS:  Current Outpatient Medications  Medication Sig Dispense Refill   b complex vitamins tablet Take 1 tablet by mouth daily.     Calcium 1500 MG tablet Take 1,500 mg by mouth.     carbidopa-levodopa (SINEMET IR) 25-100 MG tablet 1 tablet at 7 AM/ 1/2 tablet at 9, 1/2 tablet at 11 AM/1/2 tablet at 1pm/1/2 tablet at 3 PM/1/2 tablet at 5pm/ 1/2 tablet at 7 PM 360 tablet 0   Cholecalciferol 5000 units TABS Take 1 tablet (5,000 Units total) by mouth daily.     clonazePAM (KLONOPIN) 0.5 MG tablet TAKE 1/2 (ONE-HALF) TABLET BY MOUTH AT BEDTIME 45 tablet 1   Denosumab (PROLIA ) Inject into the skin every 6 (six) months.     Elastic Bandages & Supports (ABDOMINAL BINDER/ELASTIC SMALL) MISC 1 Device by Does not apply route daily. 1 each 0   Multiple Vitamin (MULTIVITAMIN) capsule 2 tabs po qd     Omega-3 Fatty Acids (OMEGA 3 PO) 1 tab po qd     omeprazole (PRILOSEC) 20 MG capsule Take 2 capsules (40 mg total) by mouth daily.     OnabotulinumtoxinA (BOTOX IM) Inject into the muscle every 3 (three) months. Injection every 3 months     tamoxifen (NOLVADEX) 20 MG tablet TAKE 1 TABLET BY MOUTH ONCE DAILY.  90 tablet 0   tiZANidine (ZANAFLEX) 4 MG tablet Take 1 tablet (4 mg total) by mouth every 8 (eight) hours as needed for muscle spasms. 60 tablet 1   venlafaxine XR (EFFEXOR-XR) 75 MG 24 hr capsule Take 1 capsule (75 mg total) by mouth daily with breakfast. 90 capsule 1   vitamin B-12 (CYANOCOBALAMIN) 500 MCG tablet Take 2500 mcg daily.     Current Facility-Administered Medications  Medication Dose Route Frequency Provider Last Rate Last Admin   incobotulinumtoxinA (XEOMIN) 100 units injection 100 Units  100 Units Intramuscular Q90 days Tat, Eustace Quail, DO   100 Units at 09/26/20 9381    SURGICAL HISTORY:  Past Surgical History:  Procedure Laterality Date   APPENDECTOMY  2002   BONE BIOPSY  11/06/10   BREAST  LUMPECTOMY  02/2010   right breast lumpectomy and sentinel node biopsy   CARDIAC CATHETERIZATION  2005   neg   CATARACT EXTRACTION, BILATERAL  2018   COLONOSCOPY     negative    HEMORRHOID SURGERY  1970's   SALPINGOOPHORECTOMY  2002   for benign growths   SHOULDER SURGERY Right    TOTAL ABDOMINAL HYSTERECTOMY W/ BILATERAL SALPINGOOPHORECTOMY  80 yrs old   for pain,prolapse ; G3 P2   UPPER GASTROINTESTINAL ENDOSCOPY      REVIEW OF SYSTEMS:  A comprehensive review of systems was negative except for: Constitutional: positive for fatigue Neurological: positive for tremors   PHYSICAL EXAMINATION: General appearance: alert, cooperative, fatigued, and no distress Head: Normocephalic, without obvious abnormality, atraumatic Neck: no adenopathy, no JVD, supple, symmetrical, trachea midline, and thyroid not enlarged, symmetric, no tenderness/mass/nodules Lymph nodes: Cervical, supraclavicular, and axillary nodes normal. Resp: clear to auscultation bilaterally Back: symmetric, no curvature. ROM normal. No CVA tenderness. Cardio: regular rate and rhythm, S1, S2 normal, no murmur, click, rub or gallop GI: soft, non-tender; bowel sounds normal; no masses,  no organomegaly Extremities: extremities normal, atraumatic, no cyanosis or edema  ECOG PERFORMANCE STATUS: 1 - Symptomatic but completely ambulatory  Blood pressure 116/84, pulse 88, temperature (!) 97 F (36.1 C), temperature source Tympanic, resp. rate 18, weight 110 lb 6 oz (50.1 kg), SpO2 99 %.  LABORATORY DATA: Lab Results  Component Value Date   WBC 6.8 10/10/2020   HGB 12.1 10/10/2020   HCT 37.8 10/10/2020   MCV 100.0 10/10/2020   PLT 211 10/10/2020      Chemistry      Component Value Date/Time   NA 143 10/10/2020 1021   NA 141 10/13/2016 1141   K 4.0 10/10/2020 1021   K 3.6 10/13/2016 1141   CL 107 10/10/2020 1021   CL 107 03/21/2012 1501   CO2 27 10/10/2020 1021   CO2 27 10/13/2016 1141   BUN 13 10/10/2020 1021    BUN 20.0 10/13/2016 1141   CREATININE 0.73 10/10/2020 1021   CREATININE 0.8 10/13/2016 1141      Component Value Date/Time   CALCIUM 9.2 10/10/2020 1021   CALCIUM 9.5 10/13/2016 1141   ALKPHOS 49 10/10/2020 1021   ALKPHOS 50 10/13/2016 1141   AST 20 10/10/2020 1021   AST 25 10/13/2016 1141   ALT <6 10/10/2020 1021   ALT 17 10/13/2016 1141   BILITOT 0.5 10/10/2020 1021   BILITOT 0.27 10/13/2016 1141       RADIOGRAPHIC STUDIES: No results found.  ASSESSMENT AND PLAN:  This is a very pleasant 80 years old white female with history of breast adenocarcinoma currently on tamoxifen as well as history  of monoclonal gammopathy of undetermined significance currently on observation. The patient had repeat myeloma panel performed recently.  I discussed the lab results with the patient and that showed significant increase in the free kappa light chain which doubled compared to a year ago.  I recommended for the patient to consider a bone marrow biopsy and aspirate to rule out progression to multiple myeloma. I will see her back for follow-up visit in 1 months for evaluation and discussion of the biopsy results and further recommendation regarding her condition For the history of breast cancer, she will continue her current treatment with tamoxifen. For the bone disease, she will continue her treatment with Prolia every 6 months. For the Parkinson's disease, she will continue her current treatment with carbidopa/levodopa under the care of neurology. The patient was advised to call immediately if she has any other concerning symptoms in the interval  The patient voices understanding of current disease status and treatment options and is in agreement with the current care plan. All questions were answered. The patient knows to call the clinic with any problems, questions or concerns. We can certainly see the patient much sooner if necessary.   Disclaimer: This note was dictated with voice  recognition software. Similar sounding words can inadvertently be transcribed and may not be corrected upon review.

## 2020-10-16 NOTE — Patient Instructions (Signed)
Denosumab injection What is this medication? DENOSUMAB (den oh sue mab) slows bone breakdown. Prolia is used to treat osteoporosis in women after menopause and in men, and in people who are taking corticosteroids for 6 months or more. Xgeva is used to treat a high calcium level due to cancer and to prevent bone fractures and other bone problems caused by multiple myeloma or cancer bone metastases. Xgeva is also used to treat giant cell tumor of the bone. This medicine may be used for other purposes; ask your health care provider or pharmacist if you have questions. COMMON BRAND NAME(S): Prolia, XGEVA What should I tell my care team before I take this medication? They need to know if you have any of these conditions: dental disease having surgery or tooth extraction infection kidney disease low levels of calcium or Vitamin D in the blood malnutrition on hemodialysis skin conditions or sensitivity thyroid or parathyroid disease an unusual reaction to denosumab, other medicines, foods, dyes, or preservatives pregnant or trying to get pregnant breast-feeding How should I use this medication? This medicine is for injection under the skin. It is given by a health care professional in a hospital or clinic setting. A special MedGuide will be given to you before each treatment. Be sure to read this information carefully each time. For Prolia, talk to your pediatrician regarding the use of this medicine in children. Special care may be needed. For Xgeva, talk to your pediatrician regarding the use of this medicine in children. While this drug may be prescribed for children as young as 13 years for selected conditions, precautions do apply. Overdosage: If you think you have taken too much of this medicine contact a poison control center or emergency room at once. NOTE: This medicine is only for you. Do not share this medicine with others. What if I miss a dose? It is important not to miss your dose.  Call your doctor or health care professional if you are unable to keep an appointment. What may interact with this medication? Do not take this medicine with any of the following medications: other medicines containing denosumab This medicine may also interact with the following medications: medicines that lower your chance of fighting infection steroid medicines like prednisone or cortisone This list may not describe all possible interactions. Give your health care provider a list of all the medicines, herbs, non-prescription drugs, or dietary supplements you use. Also tell them if you smoke, drink alcohol, or use illegal drugs. Some items may interact with your medicine. What should I watch for while using this medication? Visit your doctor or health care professional for regular checks on your progress. Your doctor or health care professional may order blood tests and other tests to see how you are doing. Call your doctor or health care professional for advice if you get a fever, chills or sore throat, or other symptoms of a cold or flu. Do not treat yourself. This drug may decrease your body's ability to fight infection. Try to avoid being around people who are sick. You should make sure you get enough calcium and vitamin D while you are taking this medicine, unless your doctor tells you not to. Discuss the foods you eat and the vitamins you take with your health care professional. See your dentist regularly. Brush and floss your teeth as directed. Before you have any dental work done, tell your dentist you are receiving this medicine. Do not become pregnant while taking this medicine or for 5 months after   stopping it. Talk with your doctor or health care professional about your birth control options while taking this medicine. Women should inform their doctor if they wish to become pregnant or think they might be pregnant. There is a potential for serious side effects to an unborn child. Talk to  your health care professional or pharmacist for more information. What side effects may I notice from receiving this medication? Side effects that you should report to your doctor or health care professional as soon as possible: allergic reactions like skin rash, itching or hives, swelling of the face, lips, or tongue bone pain breathing problems dizziness jaw pain, especially after dental work redness, blistering, peeling of the skin signs and symptoms of infection like fever or chills; cough; sore throat; pain or trouble passing urine signs of low calcium like fast heartbeat, muscle cramps or muscle pain; pain, tingling, numbness in the hands or feet; seizures unusual bleeding or bruising unusually weak or tired Side effects that usually do not require medical attention (report to your doctor or health care professional if they continue or are bothersome): constipation diarrhea headache joint pain loss of appetite muscle pain runny nose tiredness upset stomach This list may not describe all possible side effects. Call your doctor for medical advice about side effects. You may report side effects to FDA at 1-800-FDA-1088. Where should I keep my medication? This medicine is only given in a clinic, doctor's office, or other health care setting and will not be stored at home. NOTE: This sheet is a summary. It may not cover all possible information. If you have questions about this medicine, talk to your doctor, pharmacist, or health care provider.  2022 Elsevier/Gold Standard (2017-05-20 16:10:44)

## 2020-10-22 DIAGNOSIS — Z23 Encounter for immunization: Secondary | ICD-10-CM | POA: Diagnosis not present

## 2020-11-03 ENCOUNTER — Other Ambulatory Visit: Payer: Self-pay

## 2020-11-03 ENCOUNTER — Ambulatory Visit (INDEPENDENT_AMBULATORY_CARE_PROVIDER_SITE_OTHER): Payer: Medicare Other | Admitting: Family Medicine

## 2020-11-03 ENCOUNTER — Encounter: Payer: Self-pay | Admitting: Family Medicine

## 2020-11-03 VITALS — BP 120/80 | HR 84 | Temp 98.1°F | Ht 64.0 in | Wt 109.0 lb

## 2020-11-03 DIAGNOSIS — G20 Parkinson's disease: Secondary | ICD-10-CM

## 2020-11-03 DIAGNOSIS — M810 Age-related osteoporosis without current pathological fracture: Secondary | ICD-10-CM

## 2020-11-03 DIAGNOSIS — K921 Melena: Secondary | ICD-10-CM

## 2020-11-03 DIAGNOSIS — Z0000 Encounter for general adult medical examination without abnormal findings: Secondary | ICD-10-CM | POA: Diagnosis not present

## 2020-11-03 DIAGNOSIS — D472 Monoclonal gammopathy: Secondary | ICD-10-CM

## 2020-11-03 DIAGNOSIS — C50011 Malignant neoplasm of nipple and areola, right female breast: Secondary | ICD-10-CM

## 2020-11-03 DIAGNOSIS — Z7189 Other specified counseling: Secondary | ICD-10-CM

## 2020-11-03 DIAGNOSIS — G20A1 Parkinson's disease without dyskinesia, without mention of fluctuations: Secondary | ICD-10-CM

## 2020-11-03 DIAGNOSIS — M545 Low back pain, unspecified: Secondary | ICD-10-CM

## 2020-11-03 MED ORDER — VENLAFAXINE HCL ER 37.5 MG PO CP24: 112.5000 mg | ORAL_CAPSULE | Freq: Every day | ORAL | 12 refills | Status: DC

## 2020-11-03 MED ORDER — METHOCARBAMOL 500 MG PO TABS
500.0000 mg | ORAL_TABLET | Freq: Three times a day (TID) | ORAL | 3 refills | Status: DC | PRN
Start: 2020-11-03 — End: 2022-04-16

## 2020-11-03 NOTE — Progress Notes (Signed)
This visit occurred during the SARS-CoV-2 public health emergency.  Safety protocols were in place, including screening questions prior to the visit, additional usage of staff PPE, and extensive cleaning of exam room while observing appropriate contact time as indicated for disinfecting solutions.  I have personally reviewed the Medicare Annual Wellness questionnaire and have noted 1. The patient's medical and social history 2. Their use of alcohol, tobacco or illicit drugs 3. Their current medications and supplements 4. The patient's functional ability including ADL's, fall risks, home safety risks and hearing or visual             impairment. 5. Diet and physical activities 6. Evidence for depression or mood disorders  The patients weight, height, BMI have been recorded in the chart and visual acuity is per eye clinic.  I have made referrals, counseling and provided education to the patient based review of the above and I have provided the pt with a written personalized care plan for preventive services.  Provider list updated- see scanned forms.  Routine anticipatory guidance given to patient.  See health maintenance. The possibility exists that previously documented standard health maintenance information may have been brought forward from a previous encounter into this note.  If needed, that same information has been updated to reflect the current situation based on today's encounter.    Flu 2022 Shingles up-to-date PNA up-to-date Tetanus discussed with patient, done 2021 per patient report. COVID-vaccine previously done Colon cancer screening deferred given pending bone marrow biopsy. Breast cancer screening 2022 Advance directive-son Rolan Bucco is designated if patient were incapacitated. Cognitive function addressed- see scanned forms- and if abnormal then additional documentation follows.   In addition to [facility], follow up visit for the below conditions:  Weight loss  noted.  Had seen GI clinic.  Rec'd to  - continue fiber daily - IB gard PRN - counseled on and handout on low FODMAP diet - immodium PRN - lactaid PRN She doesn't have black stools now.  She reports her appetite is "okay."  Reasonable to monitor for now.  MGUS with bone marrow biopsy pending.  I'll defer to onc and she agrees.    Still on prolia every 6 months per hematology clinic.    Still on tamoxifen per onc.  I will defer.  She agrees.  Continues venlafaxine.  No adverse effect on medication.  It helps her mood some.  Still with lower back pain.  Was drowsy with zanaflex. Ongoing L lower back pain, at baseline.  Prev improved with PT.  No fall but pain in the meantime.  She has degenerative changes in the L spine at baseline.    PMH and SH reviewed  Meds, vitals, and allergies reviewed.   ROS: Per HPI.  Unless specifically indicated otherwise in HPI, the patient denies:  General: fever. Eyes: acute vision changes ENT: sore throat Cardiovascular: chest pain Respiratory: SOB GI: vomiting GU: dysuria Musculoskeletal: acute back pain Derm: acute rash Neuro: acute motor dysfunction Psych: worsening mood Endocrine: polydipsia Heme: bleeding Allergy: hayfever  GEN: nad, alert and oriented HEENT: ncat NECK: supple w/o LA CV: rrr. PULM: ctab, no inc wob ABD: soft, +bs EXT: no edema SKIN: no acute rash Walking with cane.  Tremor at baseline.  L lower back ttp w/o rash.  No midline pain

## 2020-11-03 NOTE — Patient Instructions (Signed)
I'll await the results from your biopsy.    I would try increasing the venlafaxine to 112.5mg  a day (3 of the 37.5mg  tabs).  I sent a new prescription to the pharmacy.   I would try methocarbamol for your back and see if that helps.  It should be less likely to make you drowsy than zanaflex.  See if that helps.    We'll go from there.    Take care.  Glad to see you.

## 2020-11-04 ENCOUNTER — Telehealth: Payer: Self-pay

## 2020-11-04 NOTE — Telephone Encounter (Signed)
PA was done in [email or web address] for Venlafaxine ER 37.5 mg capsules. PA was approved from 11/04/20 to 11/04/21.

## 2020-11-05 NOTE — Assessment & Plan Note (Signed)
History of, but not currently.  She reports her appetite is "okay".  Reasonable to have her monitor her weight at home and update [place] as needed.  The main issue right now is to get through her bone marrow biopsy.

## 2020-11-05 NOTE — Assessment & Plan Note (Signed)
History of, with ongoing tamoxifen use.  I will defer to hematology.  She agrees.

## 2020-11-05 NOTE — Assessment & Plan Note (Signed)
She was drowsy with Zanaflex.  Discussed other options.  She can try methocarbamol with sedation caution and update me as needed.  That should be less sedating.

## 2020-11-05 NOTE — Assessment & Plan Note (Signed)
Advance directive-son [name] is designated if patient were incapacitated.

## 2020-11-05 NOTE — Assessment & Plan Note (Signed)
Flu 2022 Shingles up-to-date PNA up-to-date Tetanus discussed with patient, done 2021 per patient report. COVID-vaccine previously done Colon cancer screening deferred given pending bone marrow biopsy. Breast cancer screening 2022 Advance directive-son Rolan Bucco is designated patient were incapacitated. Cognitive function addressed- see scanned forms- and if abnormal then additional documentation follows.

## 2020-11-05 NOTE — Assessment & Plan Note (Signed)
Per neurology.  I will defer.  She agrees.

## 2020-11-05 NOTE — Assessment & Plan Note (Signed)
Continue Prolia per hematology.

## 2020-11-05 NOTE — Assessment & Plan Note (Signed)
With follow-up bone marrow biopsy pending.  I will await those notes.

## 2020-11-06 ENCOUNTER — Other Ambulatory Visit: Payer: Self-pay | Admitting: Radiology

## 2020-11-10 ENCOUNTER — Ambulatory Visit (HOSPITAL_COMMUNITY)
Admission: RE | Admit: 2020-11-10 | Discharge: 2020-11-10 | Disposition: A | Discharge status: Home / Self Care | Payer: Medicare Other | Source: Ambulatory Visit | Attending: Internal Medicine | Admitting: Internal Medicine

## 2020-11-10 ENCOUNTER — Encounter (HOSPITAL_COMMUNITY): Payer: Self-pay

## 2020-11-10 ENCOUNTER — Other Ambulatory Visit: Payer: Self-pay

## 2020-11-10 DIAGNOSIS — D7589 Other specified diseases of blood and blood-forming organs: Secondary | ICD-10-CM | POA: Insufficient documentation

## 2020-11-10 DIAGNOSIS — G20 Parkinson's disease: Secondary | ICD-10-CM | POA: Diagnosis not present

## 2020-11-10 DIAGNOSIS — C9000 Multiple myeloma not having achieved remission: Secondary | ICD-10-CM | POA: Diagnosis not present

## 2020-11-10 DIAGNOSIS — D472 Monoclonal gammopathy: Secondary | ICD-10-CM | POA: Diagnosis not present

## 2020-11-10 DIAGNOSIS — M810 Age-related osteoporosis without current pathological fracture: Secondary | ICD-10-CM | POA: Insufficient documentation

## 2020-11-10 DIAGNOSIS — Z79899 Other long term (current) drug therapy: Secondary | ICD-10-CM | POA: Diagnosis not present

## 2020-11-10 LAB — CBC WITH DIFFERENTIAL/PLATELET
Abs Immature Granulocytes: 0.02 10*3/uL (ref 0.00–0.07)
Basophils Absolute: 0.1 10*3/uL (ref 0.0–0.1)
Basophils Relative: 1 %
Eosinophils Absolute: 0.2 10*3/uL (ref 0.0–0.5)
Eosinophils Relative: 4 %
HCT: 42.4 % (ref 36.0–46.0)
Hemoglobin: 12.6 g/dL (ref 12.0–15.0)
Immature Granulocytes: 0 %
Lymphocytes Relative: 34 %
Lymphs Abs: 1.8 10*3/uL (ref 0.7–4.0)
MCH: 32.7 pg (ref 26.0–34.0)
MCHC: 29.7 g/dL — ABNORMAL LOW (ref 30.0–36.0)
MCV: 110.1 fL — ABNORMAL HIGH (ref 80.0–100.0)
Monocytes Absolute: 0.4 10*3/uL (ref 0.1–1.0)
Monocytes Relative: 7 %
Neutro Abs: 2.9 10*3/uL (ref 1.7–7.7)
Neutrophils Relative %: 54 %
Platelets: 228 10*3/uL (ref 150–400)
RBC: 3.85 MIL/uL — ABNORMAL LOW (ref 3.87–5.11)
RDW: 14.1 % (ref 11.5–15.5)
WBC: 5.4 10*3/uL (ref 4.0–10.5)
nRBC: 0 % (ref 0.0–0.2)

## 2020-11-10 MED ORDER — FENTANYL CITRATE (PF) 100 MCG/2ML IJ SOLN
INTRAMUSCULAR | Status: DC | PRN
  Administered 2020-11-10 (×2): 25 ug via INTRAVENOUS

## 2020-11-10 MED ORDER — SODIUM CHLORIDE 0.9 % IV SOLN: INTRAVENOUS | Status: DC

## 2020-11-10 MED ORDER — MIDAZOLAM HCL 2 MG/2ML IJ SOLN
INTRAMUSCULAR | Status: DC | PRN
  Administered 2020-11-10 (×2): .5 mg via INTRAVENOUS

## 2020-11-10 MED ORDER — FENTANYL CITRATE (PF) 100 MCG/2ML IJ SOLN
INTRAMUSCULAR | Status: AC
  Filled 2020-11-10: qty 2

## 2020-11-10 MED ORDER — MIDAZOLAM HCL 2 MG/2ML IJ SOLN
INTRAMUSCULAR | Status: AC
  Filled 2020-11-10: qty 4

## 2020-11-10 MED ORDER — LIDOCAINE HCL (PF) 1 % IJ SOLN
INTRAMUSCULAR | Status: DC | PRN
  Administered 2020-11-10: 20 mL

## 2020-11-10 NOTE — H&P (Signed)
Chief Complaint: Patient was seen in consultation today for image guided bone marrow biopsy and aspiration at the request of [facility]  Referring Physician(s): Mohamed,Mohamed  Supervising Physician: Michaelle Birks  Patient Status: [facility] - Out-pt  History of Present Illness: [name] is a 80 y.o. female with PMH of anemia, breast cancer, cataract, HLD, monoclonal gammopathy, MVP, osteoporosis, Parkinson disease, and vertigo diplopia.  Patient has been observed for monoclonal gammopathy since 2007.  Patient's recent myeloma panel showed significant increase in free kappa light chain.  Dr. Curt Bears has referred patient here for bone marrow biopsy and aspiration to rule out progression of multiple myeloma.  Past Medical History:  Diagnosis Date   Allergy    SEASONAL   Anemia    Anemia-NOS / PMH, Dr Julien Nordmann   Arthritis    Breast cancer [facility])    stage I right breast   Cataract    BILATERAL   Decreased vision    R eye   Esophageal Stricture    Hyperlipidemia    Monoclonal gammopathy    MVP (mitral valve prolapse)    Osteoporosis    Parkinson disease (HCC)    Skin cancer    basal cell L neck   Vertical diplopia     Past Surgical History:  Procedure Laterality Date   APPENDECTOMY  2002   BONE BIOPSY  11/06/10   BREAST LUMPECTOMY  02/2010   right breast lumpectomy and sentinel node biopsy   CARDIAC CATHETERIZATION  2005   neg   CATARACT EXTRACTION, BILATERAL  2018   COLONOSCOPY     negative    HEMORRHOID SURGERY  1970's   SALPINGOOPHORECTOMY  2002   for benign growths   SHOULDER SURGERY Right    TOTAL ABDOMINAL HYSTERECTOMY W/ BILATERAL SALPINGOOPHORECTOMY  80 yrs old   for pain,prolapse ; G3 P2   UPPER GASTROINTESTINAL ENDOSCOPY      Allergies: Penicillins  Medications: Prior to Admission medications   Medication Sig Start Date End Date Taking? Authorizing Provider  carbidopa-levodopa (SINEMET IR) 25-100 MG tablet 1 tablet at 7 AM/ 1/2  tablet at 9, 1/2 tablet at 11 AM/1/2 tablet at 1pm/1/2 tablet at 3 PM/1/2 tablet at 5pm/ 1/2 tablet at 7 PM 10/06/20  Yes Tat, Eustace Quail, DO  clonazePAM (KLONOPIN) 0.5 MG tablet TAKE 1/2 (ONE-HALF) TABLET BY MOUTH AT BEDTIME 09/11/20  Yes Tat, Eustace Quail, DO  methocarbamol (ROBAXIN) 500 MG tablet Take 1 tablet (500 mg total) by mouth every 8 (eight) hours as needed for muscle spasms. 11/03/20  Yes Tonia Ghent, MD  omeprazole (PRILOSEC) 20 MG capsule Take 2 capsules (40 mg total) by mouth daily. 02/25/20  Yes Tonia Ghent, MD  OnabotulinumtoxinA (BOTOX IM) Inject into the muscle every 3 (three) months. Injection every 3 months   Yes [provider]  tamoxifen (NOLVADEX) 20 MG tablet TAKE 1 TABLET BY MOUTH ONCE DAILY. 09/17/20  Yes Curt Bears, MD  venlafaxine XR (EFFEXOR-XR) 37.5 MG 24 hr capsule Take 3 capsules (112.5 mg total) by mouth daily with breakfast. 11/03/20  Yes Tonia Ghent, MD  b complex vitamins tablet Take 1 tablet by mouth daily.    [provider]  Calcium 1500 MG tablet Take 1,500 mg by mouth.    [provider]  Cholecalciferol 5000 units TABS Take 1 tablet (5,000 Units total) by mouth daily. 01/23/17   Tonia Ghent, MD  Denosumab (PROLIA [place]) Inject into the skin every 6 (six) months.  [provider]  Elastic Bandages & Supports (ABDOMINAL BINDER/ELASTIC SMALL) MISC 1 Device by Does not apply route daily. 10/15/15   Tat, Eustace Quail, DO  Multiple Vitamin (MULTIVITAMIN) capsule 2 tabs po qd    [provider]  Omega-3 Fatty Acids (OMEGA 3 PO) 1 tab po qd    [provider]  vitamin B-12 (CYANOCOBALAMIN) 500 MCG tablet Take 2500 mcg daily.    [provider]     Family History  Problem Relation Age of Onset   Diabetes Mother    Heart disease Mother    Diabetes Brother    Heart disease Brother    Hepatitis Father    Diabetes Brother    Diabetes Brother    Breast cancer Neg Hx    Colon cancer Neg  Hx     Social History   Socioeconomic History   Marital status: Widowed    Spouse name: Not on file   Number of children: 2   Years of education: Not on file   Highest education level: Not on file  Occupational History   Occupation: Retired  Tobacco Use   Smoking status: Never   Smokeless tobacco: Never  Vaping Use   Vaping Use: Never used  Substance and Sexual Activity   Alcohol use: Not Currently    Alcohol/week: 1.0 standard drink    Types: 1 Glasses of wine per week   Drug use: No   Sexual activity: Not Currently  Other Topics Concern   Not on file  Social History Narrative   Occupation: Textile   Widowed 2016, husband had cancer, was married in 1959    Patient has never smoked.    Illicit Drug Use - no   Patient does not get regular exercise.    Daily Caffeine Use: 1 cup of coffee daily   Right Handed   Lives in a one story home   Social Determinants of Health   Financial Resource Strain: Not on file  Food Insecurity: Not on file  Transportation Needs: Not on file  Physical Activity: Not on file  Stress: Not on file  Social Connections: Not on file      Review of Systems: A 12 point ROS discussed and pertinent positives are indicated in the HPI above.  All other systems are negative.  Review of Systems  Constitutional:  Negative for chills and fever.  HENT:  Negative for nosebleeds.   Respiratory:  Negative for cough and shortness of breath.   Cardiovascular:  Negative for chest pain and leg swelling.  Gastrointestinal:  Negative for abdominal pain, blood in stool, diarrhea, nausea and vomiting.  Genitourinary:  Negative for hematuria.  Neurological:  Positive for dizziness. Negative for weakness.   Vital Signs: BP (!) 161/76   Pulse 71   Temp (!) 97.5 F (36.4 C) (Oral)   Resp 16   SpO2 100%   Physical Exam Constitutional:      Appearance: Normal appearance.  HENT:     Head: Normocephalic and atraumatic.     Mouth/Throat:     Mouth:  Mucous membranes are dry.     Pharynx: Oropharynx is clear.  Cardiovascular:     Rate and Rhythm: Normal rate and regular rhythm.     Heart sounds: No murmur heard.   No gallop.  Pulmonary:     Effort: Pulmonary effort is normal. No respiratory distress.     Breath sounds: Normal breath sounds. No stridor. No wheezing, rhonchi or rales.  Abdominal:  General: Bowel sounds are normal. There is no distension.     Palpations: Abdomen is soft.     Tenderness: There is no abdominal tenderness. There is no guarding.  Musculoskeletal:     Right lower leg: Edema present.     Left lower leg: Edema present.     Comments: Mild bilateral LEE present   Skin:    General: Skin is warm and dry.  Neurological:     Mental Status: She is alert and oriented to person, place, and time.  Psychiatric:        Mood and Affect: Mood normal.        Behavior: Behavior normal.        Thought Content: Thought content normal.        Judgment: Judgment normal.    Imaging: No results found.  Labs:  CBC: Recent Labs    02/25/20 1153 04/07/20 1532 10/10/20 1021 11/10/20 0730  WBC 8.3 8.4 6.8 5.4  HGB 12.6 12.6 12.1 12.6  HCT 38.6 37.3 37.8 42.4  PLT 229.0 228.0 211 228    COAGS: No results for input(s): INR, APTT in the last 8760 hours.  BMP: Recent Labs    02/25/20 1153 04/07/20 1532 10/10/20 1021  NA 142 141 143  K 4.4 3.7 4.0  CL 107 105 107  CO2 28 27 27   GLUCOSE 89 83 100*  BUN 14 12 13   CALCIUM 9.1 9.1 9.2  CREATININE 0.71 0.66 0.73  GFRNONAA  --   --  >60    LIVER FUNCTION TESTS: Recent Labs    02/25/20 1153 10/10/20 1021  BILITOT 0.5 0.5  AST 23 20  ALT 3 <6  ALKPHOS 45 49  PROT 6.9 7.1  ALBUMIN 3.7 3.6    TUMOR MARKERS: No results for input(s): AFPTM, CEA, CA199, CHROMGRNA in the last 8760 hours.  Assessment and Plan: History of anemia, breast cancer, cataract, HLD, monoclonal gammopathy, MVP, osteoporosis, Parkinson disease, and vertigo diplopia.  Patient  has been observed for monoclonal gammopathy since 2007.  Patient's recent myeloma panel showed significant increase in free kappa light chain.  Dr. Earlie Server has referred patient here for bone marrow biopsy and aspiration to rule out progression of multiple myeloma.  Pt resting quietly on stretcher. She is A&O, calm and pleasant.  She states that she is NPO per order Pt denies the use of blood thinners.  She is in no distress. VSS Today's labs pending.   Risks and benefits of bone marrow biopsy and aspiration was discussed with the patient and/or patient's family including, but not limited to bleeding, infection, damage to adjacent structures or low yield requiring additional tests.  All of the questions were answered and there is agreement to proceed.  Consent signed and in chart.   Thank you for this interesting consult.  I greatly enjoyed meeting Brier MILKA WINDHOLZ and look forward to participating in their care.  A copy of this report was sent to the requesting provider on this date.  Electronically Signed: Tyson Alias, NP 11/10/2020, 8:45 AM   I spent a total of 30-minute in face to face in clinical consultation, greater than 50% of which was counseling/coordinating care for image guided bone marrow biopsy and aspiration.

## 2020-11-10 NOTE — Procedures (Signed)
Vascular and Interventional Radiology Procedure Note  Patient: [name] DOB: [birth year] Medical Record Number: [id number] Note Date/Time: 11/10/20 9:51 AM   Performing Physician: Michaelle Birks, MD Assistant(s): None  Diagnosis: Monoclonal paraproteneinemia  Procedure: BONE MARROW ASPIRATION AND BIOPSY  Anesthesia: Conscious Sedation Complications: None Estimated Blood Loss: Minimal Specimens: Sent for Pathology  Findings:  Successful CT-guided bone marrow biopsy A total of 1 cores were obtained. Hemostasis of the tract was achieved using Manual Pressure.  Plan: Bed rest for 1 hours.  See detailed procedure note with images in PACS. The patient tolerated the procedure well without incident or complication and was returned to Recovery in stable condition.    Michaelle Birks, MD [facility] Radiology   Pager. [place]

## 2020-11-17 ENCOUNTER — Other Ambulatory Visit: Payer: Self-pay

## 2020-11-17 ENCOUNTER — Encounter: Payer: Self-pay | Admitting: Internal Medicine

## 2020-11-17 ENCOUNTER — Inpatient Hospital Stay: Payer: Medicare Other | Attending: Internal Medicine | Admitting: Internal Medicine

## 2020-11-17 VITALS — BP 130/67 | HR 109 | Temp 97.3°F | Resp 18 | Ht 64.0 in | Wt 109.2 lb

## 2020-11-17 DIAGNOSIS — Z85828 Personal history of other malignant neoplasm of skin: Secondary | ICD-10-CM | POA: Insufficient documentation

## 2020-11-17 DIAGNOSIS — C50011 Malignant neoplasm of nipple and areola, right female breast: Secondary | ICD-10-CM | POA: Insufficient documentation

## 2020-11-17 DIAGNOSIS — Z79810 Long term (current) use of selective estrogen receptor modulators (SERMs): Secondary | ICD-10-CM | POA: Diagnosis not present

## 2020-11-17 DIAGNOSIS — G20 Parkinson's disease: Secondary | ICD-10-CM | POA: Insufficient documentation

## 2020-11-17 DIAGNOSIS — Z8719 Personal history of other diseases of the digestive system: Secondary | ICD-10-CM | POA: Insufficient documentation

## 2020-11-17 DIAGNOSIS — Z880 Allergy status to penicillin: Secondary | ICD-10-CM | POA: Insufficient documentation

## 2020-11-17 DIAGNOSIS — D472 Monoclonal gammopathy: Secondary | ICD-10-CM | POA: Diagnosis not present

## 2020-11-17 DIAGNOSIS — Z90722 Acquired absence of ovaries, bilateral: Secondary | ICD-10-CM | POA: Diagnosis not present

## 2020-11-17 DIAGNOSIS — Z923 Personal history of irradiation: Secondary | ICD-10-CM | POA: Insufficient documentation

## 2020-11-17 DIAGNOSIS — Z9049 Acquired absence of other specified parts of digestive tract: Secondary | ICD-10-CM | POA: Insufficient documentation

## 2020-11-17 NOTE — Progress Notes (Signed)
[place] Telephone:(336) [phone]   Fax:(336) [phone]  OFFICE PROGRESS NOTE  Tonia Ghent, MD [name] [address]  DIAGNOSIS:  1) Node-negative breast cancer status post completion of radiation 05/06/2010 currently on tamoxifen as well as prolia.  2) Monoclonal gammopathy of unknown significance on observation since 2007   PRIOR THERAPY:  1) status post right lumpectomy on 03/05/2010 and it showed invasive ductal carcinoma 0.9 CM, Positive for ER/PR and negative for HER-2, with ductal carcinoma in situ present and negative sentinel lymph node biopsies.  2) status post adjuvant radiotherapy completed in April of 2012   CURRENT THERAPY:  1) tamoxifen 20 mg by mouth daily started in 2012.  2) Prolia subcutaneous injection every 6 months.  INTERVAL HISTORY: [name] 80 y.o. female returns to the clinic today for follow-up visit accompanied by her son.  The patient is feeling fine today with no concerning complaints except for tremors from the Parkinson's disease.  She denied having any current chest pain, shortness of breath, cough or hemoptysis.  She denied having any fever or chills.  She has no nausea, vomiting, diarrhea or constipation.  She has no headache or visual changes.  She has no significant weight loss or night sweats.  She had a bone marrow biopsy and aspirate performed recently and she is here for evaluation and discussion of her biopsy results and recommendation regarding her condition.   MEDICAL HISTORY: Past Medical History:  Diagnosis Date   Allergy    SEASONAL   Anemia    Anemia-NOS / PMH, Dr Julien Nordmann   Arthritis    Breast cancer ([place])    stage I right breast   Cataract    BILATERAL   Decreased vision    R eye   Esophageal Stricture    Hyperlipidemia    Monoclonal gammopathy    MVP (mitral valve prolapse)    Osteoporosis    Parkinson disease ([facility])    Skin cancer    basal cell L neck   Vertical  diplopia     ALLERGIES:  is allergic to penicillins.  MEDICATIONS:  Current Outpatient Medications  Medication Sig Dispense Refill   b complex vitamins tablet Take 1 tablet by mouth daily.     Calcium 1500 MG tablet Take 1,500 mg by mouth.     carbidopa-levodopa (SINEMET IR) 25-100 MG tablet 1 tablet at 7 AM/ 1/2 tablet at 9, 1/2 tablet at 11 AM/1/2 tablet at 1pm/1/2 tablet at 3 PM/1/2 tablet at 5pm/ 1/2 tablet at 7 PM 360 tablet 0   Cholecalciferol 5000 units TABS Take 1 tablet (5,000 Units total) by mouth daily.     clonazePAM (KLONOPIN) 0.5 MG tablet TAKE 1/2 (ONE-HALF) TABLET BY MOUTH AT BEDTIME 45 tablet 1   Denosumab (PROLIA [place]) Inject into the skin every 6 (six) months.     Elastic Bandages & Supports (ABDOMINAL BINDER/ELASTIC SMALL) MISC 1 Device by Does not apply route daily. 1 each 0   methocarbamol (ROBAXIN) 500 MG tablet Take 1 tablet (500 mg total) by mouth every 8 (eight) hours as needed for muscle spasms. 90 tablet 3   Multiple Vitamin (MULTIVITAMIN) capsule 2 tabs po qd     Omega-3 Fatty Acids (OMEGA 3 PO) 1 tab po qd     omeprazole (PRILOSEC) 20 MG capsule Take 2 capsules (40 mg total) by mouth daily.     OnabotulinumtoxinA (BOTOX IM) Inject into the muscle every 3 (three)  months. Injection every 3 months     tamoxifen (NOLVADEX) 20 MG tablet TAKE 1 TABLET BY MOUTH ONCE DAILY. 90 tablet 0   venlafaxine XR (EFFEXOR-XR) 37.5 MG 24 hr capsule Take 3 capsules (112.5 mg total) by mouth daily with breakfast. 90 capsule 12   vitamin B-12 (CYANOCOBALAMIN) 500 MCG tablet Take 2500 mcg daily.     Current Facility-Administered Medications  Medication Dose Route Frequency Provider Last Rate Last Admin   incobotulinumtoxinA (XEOMIN) 100 units injection 100 Units  100 Units Intramuscular Q90 days Tat, Eustace Quail, DO   100 Units at 09/26/20 3846    SURGICAL HISTORY:  Past Surgical History:  Procedure Laterality Date   APPENDECTOMY  2002   BONE BIOPSY  11/06/10   BREAST LUMPECTOMY   02/2010   right breast lumpectomy and sentinel node biopsy   CARDIAC CATHETERIZATION  2005   neg   CATARACT EXTRACTION, BILATERAL  2018   COLONOSCOPY     negative    HEMORRHOID SURGERY  1970's   SALPINGOOPHORECTOMY  2002   for benign growths   SHOULDER SURGERY Right    TOTAL ABDOMINAL HYSTERECTOMY W/ BILATERAL SALPINGOOPHORECTOMY  80 yrs old   for pain,prolapse ; G3 P2   UPPER GASTROINTESTINAL ENDOSCOPY      REVIEW OF SYSTEMS:  Constitutional: positive for fatigue Eyes: negative Ears, nose, mouth, throat, and face: negative Respiratory: negative Cardiovascular: negative Gastrointestinal: negative Genitourinary:negative Integument/breast: negative Hematologic/lymphatic: negative Musculoskeletal:negative Neurological: positive for tremors Behavioral/Psych: negative Endocrine: negative Allergic/Immunologic: negative   PHYSICAL EXAMINATION: General appearance: alert, cooperative, fatigued, and no distress Head: Normocephalic, without obvious abnormality, atraumatic Neck: no adenopathy, no JVD, supple, symmetrical, trachea midline, and thyroid not enlarged, symmetric, no tenderness/mass/nodules Lymph nodes: Cervical, supraclavicular, and axillary nodes normal. Resp: clear to auscultation bilaterally Back: symmetric, no curvature. ROM normal. No CVA tenderness. Cardio: regular rate and rhythm, S1, S2 normal, no murmur, click, rub or gallop GI: soft, non-tender; bowel sounds normal; no masses,  no organomegaly Extremities: extremities normal, atraumatic, no cyanosis or edema Neurologic: Motor: Tremors  ECOG PERFORMANCE STATUS: 1 - Symptomatic but completely ambulatory  Blood pressure 130/67, pulse (!) 109, temperature (!) 97.3 F (36.3 C), temperature source Tympanic, resp. rate 18, height 5' 4"  (1.626 m), weight 109 lb 3.2 oz (49.5 kg), SpO2 97 %.  LABORATORY DATA: Lab Results  Component Value Date   WBC 5.4 11/10/2020   HGB 12.6 11/10/2020   HCT 42.4 11/10/2020   MCV  110.1 (H) 11/10/2020   PLT 228 11/10/2020      Chemistry      Component Value Date/Time   NA 143 10/10/2020 1021   NA 141 10/13/2016 1141   K 4.0 10/10/2020 1021   K 3.6 10/13/2016 1141   CL 107 10/10/2020 1021   CL 107 03/21/2012 1501   CO2 27 10/10/2020 1021   CO2 27 10/13/2016 1141   BUN 13 10/10/2020 1021   BUN 20.0 10/13/2016 1141   CREATININE 0.73 10/10/2020 1021   CREATININE 0.8 10/13/2016 1141      Component Value Date/Time   CALCIUM 9.2 10/10/2020 1021   CALCIUM 9.5 10/13/2016 1141   ALKPHOS 49 10/10/2020 1021   ALKPHOS 50 10/13/2016 1141   AST 20 10/10/2020 1021   AST 25 10/13/2016 1141   ALT <6 10/10/2020 1021   ALT 17 10/13/2016 1141   BILITOT 0.5 10/10/2020 1021   BILITOT 0.27 10/13/2016 1141       RADIOGRAPHIC STUDIES: No results found.  ASSESSMENT AND PLAN:  This is a very pleasant 80 years old white female with history of breast adenocarcinoma currently on tamoxifen as well as history of monoclonal gammopathy of undetermined significance currently on observation. Her most recent myeloma panel showed increase in the free kappa light chain. I ordered a bone marrow biopsy and aspirate which was performed recently.  The biopsy showed cellular marrow with kappa restricted plasmacytosis averaging 10-15%.  The findings were characteristic of smoldering multiple myeloma in this patient who is asymptomatic and no endorgan damage. I discussed with the patient the biopsy results and recommended for her to continue on observation with repeat myeloma panel in 6 months. I would consider her for treatment if she started having symptoms or if she develops any endorgan damage secondary to her multiple myeloma. For the history of breast cancer she is currently on treatment with tamoxifen. For the bone disease, she will continue on Prolia every 6 months. For the Parkinson's disease, she is followed by neurology and currently on carbidopa/levodopa. The patient was advised  to call immediately if she has any other concerning symptoms in the interval.  The patient voices understanding of current disease status and treatment options and is in agreement with the current care plan. All questions were answered. The patient knows to call the clinic with any problems, questions or concerns. We can certainly see the patient much sooner if necessary.   Disclaimer: This note was dictated with voice recognition software. Similar sounding words can inadvertently be transcribed and may not be corrected upon review.

## 2020-11-21 ENCOUNTER — Encounter (HOSPITAL_COMMUNITY): Payer: Self-pay | Admitting: Internal Medicine

## 2020-11-24 LAB — SURGICAL PATHOLOGY

## 2020-12-19 ENCOUNTER — Other Ambulatory Visit: Payer: Self-pay | Admitting: Internal Medicine

## 2020-12-19 DIAGNOSIS — C50919 Malignant neoplasm of unspecified site of unspecified female breast: Secondary | ICD-10-CM

## 2020-12-22 ENCOUNTER — Encounter: Payer: Self-pay | Admitting: Internal Medicine

## 2020-12-23 ENCOUNTER — Other Ambulatory Visit: Payer: Self-pay | Admitting: Family Medicine

## 2020-12-26 ENCOUNTER — Ambulatory Visit: Payer: Medicare Other | Admitting: Neurology

## 2021-01-09 ENCOUNTER — Telehealth: Payer: Self-pay

## 2021-01-09 NOTE — Telephone Encounter (Signed)
New message   Benefit Verfication submitted to [employer]    Patient ID [id number]  Thank you for submitting your enrollment request!  If you would like to view the status of the enrollment, please navigate to the Case List View. Please note that the case(s) you just created may take several minutes to appear on your

## 2021-01-16 NOTE — Telephone Encounter (Signed)
F/u   Fax received from [employer] patient insurance coverage for [employer]  Prior Authorization Process: Prior Authorization is not required.  Specialty Pharmacy & Pharmacy options are not available.  Additional Instructions: Payer Name & Number: [facility] [phone] Rep Name: [employer] Trace # [id number] Benefits are verified for [id number], with diagnosis code K117. 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 The effective date is  01/26/2020 with no future termination date. This plan runs on a calendar year. This is a full-funded group  plan.. There are no exclusions, Lifetime max, or benefit cap for this plan. This plan is not a part of the  healthcare exchange nor is it a cobra plan. A referral is not required. The provider is in network with the  plan using NPI# [id number].   No coverage with Medicare for diagnosis K117 in combination with [id number] and [id number].  Prior Authorization is not required.  [facility] [employer] has established written guidance describing the coverage and coding for Xeomin  ([employer], Document ID: [id number]).  Xeomin is not covered under this policy for diagnosis code K117.

## 2021-01-30 ENCOUNTER — Other Ambulatory Visit: Payer: Self-pay

## 2021-01-30 ENCOUNTER — Ambulatory Visit (INDEPENDENT_AMBULATORY_CARE_PROVIDER_SITE_OTHER): Payer: Medicare Other | Admitting: Neurology

## 2021-01-30 DIAGNOSIS — K117 Disturbances of salivary secretion: Secondary | ICD-10-CM | POA: Diagnosis not present

## 2021-01-30 MED ORDER — INCOBOTULINUMTOXINA 100 UNITS IM SOLR
100.0000 [IU] | INTRAMUSCULAR | Status: DC
  Administered 2021-01-30: 100 [IU] via INTRAMUSCULAR

## 2021-01-30 NOTE — Procedures (Signed)
Botulinum Clinic   History:  Diagnosis: Sialorrhea associated with PD (icd10: K11.7)    Informed consent was obtained.  Discussed differences between myobloc and xeomin.  The patient was educated on the botulinum toxin the black blox warning and given a copy of the botox patient medication guide.  The patient understands that this warning states that there have been reported cases of the Botox extending beyond the injection site and creating adverse effects, similar to those of botulism. This included loss of strength, trouble walking, hoarseness, trouble saying words clearly, loss of bladder control, trouble breathing, trouble swallowing, diplopia, blurry vision and ptosis. Most of the distant spread of Botox was happening in patients, primarily children, who received medication for spasticity or for cervical dystonia. The patient expressed understanding and desire to proceed.   Injections  Location Left  Right Units Number of sites  Parotid (point 1 on picture) 30 30 60 1 per side  Submandibular (point 2) 20 20 40 1 per side  TOTAL UNITS:   100    Type of Toxin: Xeomin Discarded Units: 0  Needle drawback with each injection was free of blood. Pt tolerated procedure well without complications.   Reinjection is anticipated in 4 months.

## 2021-03-23 NOTE — Progress Notes (Signed)
Assessment/Plan:    1.  Parkinsons Disease  -Continue carbidopa/levodopa 25/100, 1 tablet at 7 AM/ 1/2 tablet at 9, 1/2 tablet at 11 AM/1/2 tablet at 1pm/1/2 tablet at 3 PM/1/2 tablet at 5pm/ 1/2 tablet at 7 PM  -no dyskinesia today.  She does report having some but we agreed today that risks>benefits of amantadine today   2.  Sialorrhea  -On Xeomin and it is working well.  Last injections January 6.  3.  RBD  -Using clonazepam, 0.5 mg, half tablet at bed.  PDMP reviewed.  Not filled since august.  She is ready for RF but thinks that there may be one at pharmacy.  4.  Constipation with what sounds like overflow incontinence  -saw GI NP - Treatment with benefiber recommended and helping  5.  Depression  -Back on her venlafaxine.  Subjective:   [name] was seen today in follow up for Parkinsons disease.  My previous records were reviewed prior to todays visit as well as outside records available to me.   Patient is back on venlafaxine (previously out of it, but primary care has since refilled it and she has restarted it).  No hallucinations.  No lightheadedness or near syncope.  Few falls - "one was bad."  [place] and had festination and fell.  Face hit ground.  No LOC.  She got self up.  She had one fall in bathroom but had stuff on the floor and she was trying to tramp over it and fell and hit head.  No LOC.  Occ dyskinesia - bothers her if driving but otherwise not a lot.   Continues to have Botox/Xeomin for sialorrhea.  That seems to be working well.  Current prescribed movement disorder medications: carbidopa/levodopa 25/100, 1 tablet at 7 AM/ 1/2 tablet at 9, 1/2 tablet at 11 AM/1/2 tablet at 1pm/1/2 tablet at 3 PM/1/2 tablet at 5pm/ 1/2 tablet at 7 PM  Clonazepam 0.5 mg, half tablet at bed (last filled 08/2020 for 90 days and just running out)  PREVIOUS MEDICATIONS: Amantadine (hallucinations); has clonazepam but rarely  takes  ALLERGIES:   Allergies  Allergen Reactions   Penicillins Shortness Of Breath and Itching    CURRENT MEDICATIONS:  Outpatient Encounter Medications as of 03/24/2021  Medication Sig   b complex vitamins tablet Take 1 tablet by mouth daily.   Calcium 1500 MG tablet Take 1,500 mg by mouth.   carbidopa-levodopa (SINEMET IR) 25-100 MG tablet 1 tablet at 7 AM/ 1/2 tablet at 9, 1/2 tablet at 11 AM/1/2 tablet at 1pm/1/2 tablet at 3 PM/1/2 tablet at 5pm/ 1/2 tablet at 7 PM   Cholecalciferol 5000 units TABS Take 1 tablet (5,000 Units total) by mouth daily.   clonazePAM (KLONOPIN) 0.5 MG tablet TAKE 1/2 (ONE-HALF) TABLET BY MOUTH AT BEDTIME   Denosumab (PROLIA [place]) Inject into the skin every 6 (six) months.   Elastic Bandages & Supports (ABDOMINAL BINDER/ELASTIC SMALL) MISC 1 Device by Does not apply route daily.   methocarbamol (ROBAXIN) 500 MG tablet Take 1 tablet (500 mg total) by mouth every 8 (eight) hours as needed for muscle spasms.   Multiple Vitamin (MULTIVITAMIN) capsule 2 tabs po qd   Omega-3 Fatty Acids (OMEGA 3 PO) 1 tab po qd   omeprazole (PRILOSEC) 20 MG capsule TAKE (1) CAPSULE BY MOUTH ONCE DAILY.   OnabotulinumtoxinA (BOTOX IM) Inject into the muscle every 3 (three) months. Injection every 3  months   tamoxifen (NOLVADEX) 20 MG tablet TAKE 1 TABLET BY MOUTH ONCE DAILY.   venlafaxine XR (EFFEXOR-XR) 37.5 MG 24 hr capsule Take 3 capsules (112.5 mg total) by mouth daily with breakfast.   vitamin B-12 (CYANOCOBALAMIN) 500 MCG tablet Take 2500 mcg daily.   Facility-Administered Encounter Medications as of 03/24/2021  Medication   incobotulinumtoxinA (XEOMIN) 100 units injection 100 Units   incobotulinumtoxinA (XEOMIN) 100 units injection 100 Units    Objective:   PHYSICAL EXAMINATION:    VITALS:   Vitals:   03/24/21 1055  BP: 128/82  Pulse: 79  SpO2: 99%  Weight: 110 lb 12.8 oz (50.3 kg)  Height: 5\' 3"  (1.6 m)      GEN:  The patient appears stated age and is in  NAD. HEENT:  Normocephalic, atraumatic.  The mucous membranes are moist. The superficial temporal arteries are without ropiness or tenderness. CV:  RRR Lungs:  CTAB Neck/HEME:  There are no carotid bruits bilaterally.  Neurological examination:  Orientation: The patient is alert and oriented x3. Cranial nerves: There is good facial symmetry with facial hypomimia. The speech is fluent and mildly dysarthric and hypophonic. Soft palate rises symmetrically and there is no tongue deviation. Hearing is intact to conversational tone. Sensation: Sensation is intact to light touch throughout Motor: Strength is at least antigravity x4.  Movement examination: Tone: There is nl tone in the UE/LE Abnormal movements: there is LUE rest tremor (no dyskinesia today).  There is mild RLE tremor Coordination:  There is mild decremation with RAM's, with finger taps and toe taps on the L Gait and Station: The patient has mild difficulty arising out of a deep-seated chair without the use of the hands. The patient's stride length is slightly decreased.  She ambulates with her cane.    Total time spent on today's visit was 22 minutes, including both face-to-face time and nonface-to-face time.  Time included that spent on review of records (prior notes available to me/labs/imaging if pertinent), discussing treatment and goals, answering patient's questions and coordinating care.  Cc:  Tonia Ghent, MD

## 2021-03-24 ENCOUNTER — Encounter: Payer: Self-pay | Admitting: Neurology

## 2021-03-24 ENCOUNTER — Other Ambulatory Visit: Payer: Self-pay | Admitting: Internal Medicine

## 2021-03-24 ENCOUNTER — Ambulatory Visit (INDEPENDENT_AMBULATORY_CARE_PROVIDER_SITE_OTHER): Payer: Medicare Other | Admitting: Neurology

## 2021-03-24 ENCOUNTER — Other Ambulatory Visit: Payer: Self-pay

## 2021-03-24 VITALS — BP 128/82 | HR 79 | Ht 63.0 in | Wt 110.8 lb

## 2021-03-24 DIAGNOSIS — G20 Parkinson's disease: Secondary | ICD-10-CM | POA: Diagnosis not present

## 2021-03-24 DIAGNOSIS — K117 Disturbances of salivary secretion: Secondary | ICD-10-CM

## 2021-03-24 DIAGNOSIS — C50919 Malignant neoplasm of unspecified site of unspecified female breast: Secondary | ICD-10-CM

## 2021-04-02 ENCOUNTER — Other Ambulatory Visit: Payer: Self-pay

## 2021-04-02 DIAGNOSIS — F330 Major depressive disorder, recurrent, mild: Secondary | ICD-10-CM

## 2021-04-02 MED ORDER — CLONAZEPAM 0.5 MG PO TABS: ORAL_TABLET | ORAL | 1 refills | Status: DC

## 2021-04-10 ENCOUNTER — Other Ambulatory Visit: Payer: Self-pay | Admitting: Neurology

## 2021-04-10 DIAGNOSIS — G20 Parkinson's disease: Secondary | ICD-10-CM

## 2021-04-13 ENCOUNTER — Other Ambulatory Visit: Payer: Self-pay

## 2021-04-15 ENCOUNTER — Inpatient Hospital Stay: Payer: Medicare Other

## 2021-04-15 ENCOUNTER — Other Ambulatory Visit: Payer: Self-pay | Admitting: *Deleted

## 2021-04-15 ENCOUNTER — Inpatient Hospital Stay: Payer: Medicare Other | Attending: Internal Medicine

## 2021-04-15 ENCOUNTER — Other Ambulatory Visit: Payer: Self-pay

## 2021-04-15 DIAGNOSIS — D472 Monoclonal gammopathy: Secondary | ICD-10-CM

## 2021-04-15 DIAGNOSIS — Z79899 Other long term (current) drug therapy: Secondary | ICD-10-CM | POA: Insufficient documentation

## 2021-04-15 DIAGNOSIS — Z170 Estrogen receptor positive status [ER+]: Secondary | ICD-10-CM | POA: Insufficient documentation

## 2021-04-15 DIAGNOSIS — C50011 Malignant neoplasm of nipple and areola, right female breast: Secondary | ICD-10-CM | POA: Diagnosis not present

## 2021-04-15 LAB — BASIC METABOLIC PANEL - CANCER CENTER ONLY
Anion gap: 5 (ref 5–15)
BUN: 19 mg/dL (ref 8–23)
CO2: 29 mmol/L (ref 22–32)
Calcium: 9 mg/dL (ref 8.9–10.3)
Chloride: 108 mmol/L (ref 98–111)
Creatinine: 0.67 mg/dL (ref 0.44–1.00)
GFR, Estimated: >60 mL/min (ref >60)
Glucose, Bld: 100 mg/dL — ABNORMAL HIGH (ref 70–99)
Potassium: 4 mmol/L (ref 3.5–5.1)
Sodium: 142 mmol/L (ref 135–145)

## 2021-04-15 NOTE — Progress Notes (Unsigned)
Patient labs came back for her prolia injection but she may have left the facility called her to let her know that her MD wanted her to get her shot. ?

## 2021-04-16 ENCOUNTER — Inpatient Hospital Stay: Payer: Medicare Other

## 2021-04-16 VITALS — BP 98/70 | HR 96 | Temp 99.3°F | Resp 16

## 2021-04-16 DIAGNOSIS — C50011 Malignant neoplasm of nipple and areola, right female breast: Secondary | ICD-10-CM

## 2021-04-16 DIAGNOSIS — Z170 Estrogen receptor positive status [ER+]: Secondary | ICD-10-CM | POA: Diagnosis not present

## 2021-04-16 DIAGNOSIS — Z79899 Other long term (current) drug therapy: Secondary | ICD-10-CM | POA: Diagnosis not present

## 2021-04-16 DIAGNOSIS — D472 Monoclonal gammopathy: Secondary | ICD-10-CM | POA: Diagnosis not present

## 2021-04-16 MED ORDER — DENOSUMAB 60 MG/ML ~~LOC~~ SOSY
60.0000 mg | PREFILLED_SYRINGE | Freq: Once | SUBCUTANEOUS | Status: AC
  Administered 2021-04-16: 60 mg via SUBCUTANEOUS
  Filled 2021-04-16: qty 1

## 2021-04-27 DIAGNOSIS — Z1231 Encounter for screening mammogram for malignant neoplasm of breast: Secondary | ICD-10-CM | POA: Diagnosis not present

## 2021-04-27 LAB — HM MAMMOGRAPHY

## 2021-05-11 ENCOUNTER — Inpatient Hospital Stay: Payer: Medicare Other | Attending: Internal Medicine

## 2021-05-11 ENCOUNTER — Other Ambulatory Visit: Payer: Self-pay

## 2021-05-11 DIAGNOSIS — G20 Parkinson's disease: Secondary | ICD-10-CM | POA: Diagnosis not present

## 2021-05-11 DIAGNOSIS — Z90722 Acquired absence of ovaries, bilateral: Secondary | ICD-10-CM | POA: Diagnosis not present

## 2021-05-11 DIAGNOSIS — C50011 Malignant neoplasm of nipple and areola, right female breast: Secondary | ICD-10-CM | POA: Diagnosis not present

## 2021-05-11 DIAGNOSIS — Z880 Allergy status to penicillin: Secondary | ICD-10-CM | POA: Insufficient documentation

## 2021-05-11 DIAGNOSIS — Z85828 Personal history of other malignant neoplasm of skin: Secondary | ICD-10-CM | POA: Insufficient documentation

## 2021-05-11 DIAGNOSIS — R5383 Other fatigue: Secondary | ICD-10-CM | POA: Diagnosis not present

## 2021-05-11 DIAGNOSIS — Z8719 Personal history of other diseases of the digestive system: Secondary | ICD-10-CM | POA: Insufficient documentation

## 2021-05-11 DIAGNOSIS — Z9049 Acquired absence of other specified parts of digestive tract: Secondary | ICD-10-CM | POA: Insufficient documentation

## 2021-05-11 DIAGNOSIS — Z923 Personal history of irradiation: Secondary | ICD-10-CM | POA: Insufficient documentation

## 2021-05-11 DIAGNOSIS — D472 Monoclonal gammopathy: Secondary | ICD-10-CM | POA: Diagnosis not present

## 2021-05-11 DIAGNOSIS — Z79899 Other long term (current) drug therapy: Secondary | ICD-10-CM | POA: Diagnosis not present

## 2021-05-11 DIAGNOSIS — Z79810 Long term (current) use of selective estrogen receptor modulators (SERMs): Secondary | ICD-10-CM | POA: Insufficient documentation

## 2021-05-11 LAB — CBC WITH DIFFERENTIAL (CANCER CENTER ONLY)
Abs Immature Granulocytes: 0.02 10*3/uL (ref 0.00–0.07)
Basophils Absolute: 0 10*3/uL (ref 0.0–0.1)
Basophils Relative: 1 %
Eosinophils Absolute: 0.2 10*3/uL (ref 0.0–0.5)
Eosinophils Relative: 3 %
HCT: 37.6 % (ref 36.0–46.0)
Hemoglobin: 12.5 g/dL (ref 12.0–15.0)
Immature Granulocytes: 0 %
Lymphocytes Relative: 25 %
Lymphs Abs: 1.6 10*3/uL (ref 0.7–4.0)
MCH: 33.1 pg (ref 26.0–34.0)
MCHC: 33.2 g/dL (ref 30.0–36.0)
MCV: 99.5 fL (ref 80.0–100.0)
Monocytes Absolute: 0.3 10*3/uL (ref 0.1–1.0)
Monocytes Relative: 5 %
Neutro Abs: 4.4 10*3/uL (ref 1.7–7.7)
Neutrophils Relative %: 66 %
Platelet Count: 191 10*3/uL (ref 150–400)
RBC: 3.78 MIL/uL — ABNORMAL LOW (ref 3.87–5.11)
RDW: 14 % (ref 11.5–15.5)
WBC Count: 6.7 10*3/uL (ref 4.0–10.5)
nRBC: 0 % (ref 0.0–0.2)

## 2021-05-11 LAB — CMP (CANCER CENTER ONLY)
ALT: <5 U/L (ref 0–44)
AST: 20 U/L (ref 15–41)
Albumin: 3.6 g/dL (ref 3.5–5.0)
Alkaline Phosphatase: 43 U/L (ref 38–126)
Anion gap: 5 (ref 5–15)
BUN: 15 mg/dL (ref 8–23)
CO2: 29 mmol/L (ref 22–32)
Calcium: 8.3 mg/dL — ABNORMAL LOW (ref 8.9–10.3)
Chloride: 107 mmol/L (ref 98–111)
Creatinine: 0.68 mg/dL (ref 0.44–1.00)
GFR, Estimated: >60 mL/min (ref >60)
Glucose, Bld: 92 mg/dL (ref 70–99)
Potassium: 3.8 mmol/L (ref 3.5–5.1)
Sodium: 141 mmol/L (ref 135–145)
Total Bilirubin: 0.6 mg/dL (ref 0.3–1.2)
Total Protein: 7 g/dL (ref 6.5–8.1)

## 2021-05-11 LAB — LACTATE DEHYDROGENASE: LDH: 165 U/L (ref 98–192)

## 2021-05-12 LAB — KAPPA/LAMBDA LIGHT CHAINS
Kappa free light chain: 121.8 mg/L — ABNORMAL HIGH (ref 3.3–19.4)
Kappa, lambda light chain ratio: 12.69 — ABNORMAL HIGH (ref 0.26–1.65)
Lambda free light chains: 9.6 mg/L (ref 5.7–26.3)

## 2021-05-12 LAB — IGG, IGA, IGM
IgA: 36 mg/dL — ABNORMAL LOW (ref 64–422)
IgG (Immunoglobin G), Serum: 1787 mg/dL — ABNORMAL HIGH (ref 586–1602)
IgM (Immunoglobulin M), Srm: 26 mg/dL (ref 26–217)

## 2021-05-12 LAB — BETA 2 MICROGLOBULIN, SERUM: Beta-2 Microglobulin: 1.6 mg/L (ref 0.6–2.4)

## 2021-05-18 ENCOUNTER — Inpatient Hospital Stay (HOSPITAL_BASED_OUTPATIENT_CLINIC_OR_DEPARTMENT_OTHER): Payer: Medicare Other | Admitting: Internal Medicine

## 2021-05-18 ENCOUNTER — Other Ambulatory Visit: Payer: Self-pay

## 2021-05-18 VITALS — BP 122/84 | HR 101 | Temp 97.2°F | Resp 16 | Wt 112.2 lb

## 2021-05-18 DIAGNOSIS — C50011 Malignant neoplasm of nipple and areola, right female breast: Secondary | ICD-10-CM | POA: Diagnosis not present

## 2021-05-18 DIAGNOSIS — Z79810 Long term (current) use of selective estrogen receptor modulators (SERMs): Secondary | ICD-10-CM | POA: Diagnosis not present

## 2021-05-18 DIAGNOSIS — D472 Monoclonal gammopathy: Secondary | ICD-10-CM

## 2021-05-18 DIAGNOSIS — Z79899 Other long term (current) drug therapy: Secondary | ICD-10-CM | POA: Diagnosis not present

## 2021-05-18 DIAGNOSIS — R5383 Other fatigue: Secondary | ICD-10-CM | POA: Diagnosis not present

## 2021-05-18 DIAGNOSIS — G20 Parkinson's disease: Secondary | ICD-10-CM | POA: Diagnosis not present

## 2021-05-18 NOTE — Progress Notes (Signed)
?    [facility] ?Telephone:(336) [phone]   [phone] ? ?OFFICE PROGRESS NOTE ? ?Tonia Ghent, MD ?[place] ?[name] [address] ? ?DIAGNOSIS:  ?1) Node-negative breast cancer status post completion of radiation 05/06/2010 currently on tamoxifen as well as prolia.  ?2) Monoclonal gammopathy of unknown significance on observation since 2007  ? ?PRIOR THERAPY:  ?1) status post right lumpectomy on 03/05/2010 and it showed invasive ductal carcinoma 0.9 CM, Positive for ER/PR and negative for HER-2, with ductal carcinoma in situ present and negative sentinel lymph node biopsies.  ?2) status post adjuvant radiotherapy completed in April of 2012  ? ?CURRENT THERAPY:  ?1) tamoxifen 20 mg by mouth daily started in 2012.  ?2) Prolia subcutaneous injection every 6 months. ? ?INTERVAL HISTORY: ?[name] 81 y.o. female returns to the clinic today for 6 months follow-up visit accompanied by her son Junior.  The patient is feeling fine today with no concerning complaints except for the persistent tremor from her Parkinson's disease.  She denied having any current chest pain, shortness of breath, cough or hemoptysis.  She denied having any nausea, vomiting, diarrhea or constipation.  She has no headache or visual changes.  She has no recent weight loss or night sweats.  She had repeat myeloma panel performed recently and she is here for evaluation and discussion of her lab results. ? ? ?MEDICAL HISTORY: ?Past Medical History:  ?Diagnosis Date  ? Allergy   ? SEASONAL  ? Anemia   ? Anemia-NOS / PMH, Dr Julien Nordmann  ? Arthritis   ? Breast cancer ([place])   ? stage I right breast  ? Cataract   ? BILATERAL  ? Decreased vision   ? R eye  ? Esophageal Stricture   ? Hyperlipidemia   ? Monoclonal gammopathy   ? MVP (mitral valve prolapse)   ? Osteoporosis   ? Parkinson disease ([place])   ? Skin cancer   ? basal cell L neck  ? Vertical diplopia   ? ? ?ALLERGIES:  is allergic to  penicillins. ? ?MEDICATIONS:  ?Current Outpatient Medications  ?Medication Sig Dispense Refill  ? carbidopa-levodopa (SINEMET IR) 25-100 MG tablet 1 tablet at 7 AM/ 1/2 tablet at 9, 1/2 tablet at 11 AM/1/2 tablet at 1pm/1/2 tablet at 3 PM/1/2 tablet at 5pm/ 1/2 tablet at 7 PM 360 tablet 1  ? b complex vitamins tablet Take 1 tablet by mouth daily.    ? Calcium 1500 MG tablet Take 1,500 mg by mouth.    ? Cholecalciferol 5000 units TABS Take 1 tablet (5,000 Units total) by mouth daily.    ? clonazePAM (KLONOPIN) 0.5 MG tablet TAKE 1/2 (ONE-HALF) TABLET BY MOUTH AT BEDTIME 45 tablet 1  ? Denosumab (PROLIA [place]) Inject into the skin every 6 (six) months.    ? Elastic Bandages & Supports (ABDOMINAL BINDER/ELASTIC SMALL) MISC 1 Device by Does not apply route daily. 1 each 0  ? methocarbamol (ROBAXIN) 500 MG tablet Take 1 tablet (500 mg total) by mouth every 8 (eight) hours as needed for muscle spasms. 90 tablet 3  ? Multiple Vitamin (MULTIVITAMIN) capsule 2 tabs po qd    ? Omega-3 Fatty Acids (OMEGA 3 PO) 1 tab po qd    ? omeprazole (PRILOSEC) 20 MG capsule TAKE (1) CAPSULE BY MOUTH ONCE DAILY. 90 capsule 0  ? OnabotulinumtoxinA (BOTOX IM) Inject into the muscle every 3 (three) months. Injection every 3 months    ? tamoxifen (NOLVADEX)  20 MG tablet TAKE 1 TABLET BY MOUTH ONCE DAILY. 90 tablet 11  ? venlafaxine XR (EFFEXOR-XR) 37.5 MG 24 hr capsule Take 3 capsules (112.5 mg total) by mouth daily with breakfast. 90 capsule 12  ? vitamin B-12 (CYANOCOBALAMIN) 500 MCG tablet Take 2500 mcg daily.    ? ?Current Facility-Administered Medications  ?Medication Dose Route Frequency Provider Last Rate Last Admin  ? incobotulinumtoxinA (XEOMIN) 100 units injection 100 Units  100 Units Intramuscular Q90 days Tat, Eustace Quail, DO   100 Units at 09/26/20 1700  ? incobotulinumtoxinA (XEOMIN) 100 units injection 100 Units  100 Units Intramuscular Q90 days Tat, Eustace Quail, DO   100 Units at 01/30/21 1749  ? ? ?SURGICAL HISTORY:  ?Past Surgical  History:  ?Procedure Laterality Date  ? APPENDECTOMY  2002  ? BONE BIOPSY  11/06/10  ? BREAST LUMPECTOMY  02/2010  ? right breast lumpectomy and sentinel node biopsy  ? CARDIAC CATHETERIZATION  2005  ? neg  ? CATARACT EXTRACTION, BILATERAL  2018  ? COLONOSCOPY    ? negative   ? HEMORRHOID SURGERY  1970's  ? SALPINGOOPHORECTOMY  2002  ? for benign growths  ? SHOULDER SURGERY Right   ? TOTAL ABDOMINAL HYSTERECTOMY W/ BILATERAL SALPINGOOPHORECTOMY  81 yrs old  ? for pain,prolapse ; G3 P2  ? UPPER GASTROINTESTINAL ENDOSCOPY    ? ? ?REVIEW OF SYSTEMS:  A comprehensive review of systems was negative except for: Constitutional: positive for fatigue ?Neurological: positive for tremors  ? ?PHYSICAL EXAMINATION: General appearance: alert, cooperative, fatigued, and no distress ?Head: Normocephalic, without obvious abnormality, atraumatic ?Neck: no adenopathy, no JVD, supple, symmetrical, trachea midline, and thyroid not enlarged, symmetric, no tenderness/mass/nodules ?Lymph nodes: Cervical, supraclavicular, and axillary nodes normal. ?Resp: clear to auscultation bilaterally ?Back: symmetric, no curvature. ROM normal. No CVA tenderness. ?Cardio: regular rate and rhythm, S1, S2 normal, no murmur, click, rub or gallop ?GI: soft, non-tender; bowel sounds normal; no masses,  no organomegaly ?Extremities: extremities normal, atraumatic, no cyanosis or edema ? ?ECOG PERFORMANCE STATUS: 1 - Symptomatic but completely ambulatory ? ?Blood pressure 122/84, pulse (!) 101, temperature (!) 97.2 ?F (36.2 ?C), temperature source Tympanic, resp. rate 16, weight 112 lb 3.2 oz (50.9 kg), SpO2 96 %. ? ?LABORATORY DATA: ?Lab Results  ?Component Value Date  ? WBC 6.7 05/11/2021  ? HGB 12.5 05/11/2021  ? HCT 37.6 05/11/2021  ? MCV 99.5 05/11/2021  ? PLT 191 05/11/2021  ? ? ?  Chemistry   ?   ?Component Value Date/Time  ? NA 141 05/11/2021 1020  ? NA 141 10/13/2016 1141  ? K 3.8 05/11/2021 1020  ? K 3.6 10/13/2016 1141  ? CL 107 05/11/2021 1020  ? CL  107 03/21/2012 1501  ? CO2 29 05/11/2021 1020  ? CO2 27 10/13/2016 1141  ? BUN 15 05/11/2021 1020  ? BUN 20.0 10/13/2016 1141  ? CREATININE 0.68 05/11/2021 1020  ? CREATININE 0.8 10/13/2016 1141  ?    ?Component Value Date/Time  ? CALCIUM 8.3 (L) 05/11/2021 1020  ? CALCIUM 9.5 10/13/2016 1141  ? ALKPHOS 43 05/11/2021 1020  ? ALKPHOS 50 10/13/2016 1141  ? AST 20 05/11/2021 1020  ? AST 25 10/13/2016 1141  ? ALT <5 05/11/2021 1020  ? ALT 17 10/13/2016 1141  ? BILITOT 0.6 05/11/2021 1020  ? BILITOT 0.27 10/13/2016 1141  ?  ? ? ? ?RADIOGRAPHIC STUDIES: ?No results found. ? ?ASSESSMENT AND PLAN:  ?This is a very pleasant 81 years old white female with  history of breast adenocarcinoma currently on tamoxifen as well as history of monoclonal gammopathy of undetermined significance currently on observation. ?Her most recent myeloma panel showed increase in the free kappa light chain. ?The bone biopsy showed cellular marrow with kappa restricted plasmacytosis averaging 10-15%.  The findings were characteristic of smoldering multiple myeloma in this patient who is asymptomatic and no endorgan damage. ?The patient is currently on observation and she is feeling fine with no concerning complaints. ?Her myeloma panel showed no concerning findings for disease progression. ?I recommended for the patient to continue on observation with repeat myeloma panel in 1 year. ?For the Parkinson's disease, she is followed by neurology and currently on carbidopa/levodopa. ?She was advised to call immediately if she has any other concerning symptoms in the interval. ? ?The patient voices understanding of current disease status and treatment options and is in agreement with the current care plan. ?All questions were answered. The patient knows to call the clinic with any problems, questions or concerns. We can certainly see the patient much sooner if necessary. ? ? ?Disclaimer: This note was dictated with voice recognition software. Similar  sounding words can inadvertently be transcribed and may not be corrected upon review. ? ?  ?  ?

## 2021-06-05 ENCOUNTER — Ambulatory Visit (INDEPENDENT_AMBULATORY_CARE_PROVIDER_SITE_OTHER): Payer: Medicare Other | Admitting: Neurology

## 2021-06-05 DIAGNOSIS — K117 Disturbances of salivary secretion: Secondary | ICD-10-CM | POA: Diagnosis not present

## 2021-06-05 MED ORDER — INCOBOTULINUMTOXINA 100 UNITS IM SOLR
100.0000 [IU] | INTRAMUSCULAR | Status: DC
  Administered 2021-06-05: 100 [IU] via INTRAMUSCULAR

## 2021-06-05 NOTE — Procedures (Signed)
Botulinum Clinic  ? ?History:  ?Diagnosis: Sialorrhea associated with PD (icd10: K11.7) ? ? ? ?Informed consent was obtained.  The patient was educated on the botulinum toxin the black blox warning and given a copy of the botox patient medication guide.  The patient understands that this warning states that there have been reported cases of the Botox extending beyond the injection site and creating adverse effects, similar to those of botulism. This included loss of strength, trouble walking, hoarseness, trouble saying words clearly, loss of bladder control, trouble breathing, trouble swallowing, diplopia, blurry vision and ptosis. Most of the distant spread of Botox was happening in patients, primarily children, who received medication for spasticity or for cervical dystonia. The patient expressed understanding and desire to proceed. ? ? ?Injections  ?Location Left  Right Units Number of sites  ?Parotid (point 1 on picture) 30 30 60 1 per side  ?Submandibular (point 2) 20 20 40 1 per side  ?TOTAL UNITS:   100   ? ?Type of Toxin: Xeomin ?Discarded Units: 0  ?Needle drawback with each injection was free of blood. ?Pt tolerated procedure well without complications.   ?Reinjection is anticipated in 4 months. ? ? ? ? ?

## 2021-07-09 ENCOUNTER — Other Ambulatory Visit: Payer: Self-pay | Admitting: Family Medicine

## 2021-07-10 DIAGNOSIS — H02122 Mechanical ectropion of right lower eyelid: Secondary | ICD-10-CM | POA: Diagnosis not present

## 2021-09-16 ENCOUNTER — Telehealth: Payer: Self-pay | Admitting: Internal Medicine

## 2021-09-16 NOTE — Telephone Encounter (Signed)
Called patient regarding 2024 appointment, left a voicemail.  

## 2021-09-18 ENCOUNTER — Ambulatory Visit: Payer: Medicare Other | Admitting: Neurology

## 2021-09-23 ENCOUNTER — Ambulatory Visit: Payer: Medicare Other | Admitting: Neurology

## 2021-10-09 ENCOUNTER — Ambulatory Visit (INDEPENDENT_AMBULATORY_CARE_PROVIDER_SITE_OTHER): Payer: Medicare Other | Admitting: Neurology

## 2021-10-09 DIAGNOSIS — K117 Disturbances of salivary secretion: Secondary | ICD-10-CM | POA: Diagnosis not present

## 2021-10-09 MED ORDER — INCOBOTULINUMTOXINA 100 UNITS IM SOLR
100.0000 [IU] | INTRAMUSCULAR | Status: DC
  Administered 2021-10-09: 100 [IU] via INTRAMUSCULAR

## 2021-10-09 NOTE — Procedures (Signed)
Botulinum Clinic   History:  Diagnosis: Sialorrhea associated with PD (icd10: K11.7) Results:  states its working well   Informed consent was obtained.  The patient was educated on the botulinum toxin the black blox warning and given a copy of the botox patient medication guide.  The patient understands that this warning states that there have been reported cases of the Botox extending beyond the injection site and creating adverse effects, similar to those of botulism. This included loss of strength, trouble walking, hoarseness, trouble saying words clearly, loss of bladder control, trouble breathing, trouble swallowing, diplopia, blurry vision and ptosis. Most of the distant spread of Botox was happening in patients, primarily children, who received medication for spasticity or for cervical dystonia. The patient expressed understanding and desire to proceed.   Injections  Location Left  Right Units Number of sites  Parotid (point 1 on picture) 30 30 60 1 per side  Submandibular (point 2) 20 20 40 1 per side  TOTAL UNITS:   100    Type of Toxin: Xeomin Discarded Units: 0  Needle drawback with each injection was free of blood. Pt tolerated procedure well without complications.   Reinjection is anticipated in 4 months.

## 2021-10-15 NOTE — Progress Notes (Signed)
Assessment/Plan:    1.  Parkinsons Disease  -Continue carbidopa/levodopa 25/100, 1 tablet at 7 AM/ 1/2 tablet at 9, 1/2 tablet at 11 AM/1/2 tablet at 1pm/1/2 tablet at 3 PM/1/2 tablet at 5pm/ 1/2 tablet at 7 PM  -cautiously add amantadine 100 mg at 7am and 1 pm for significant dyskinesia.  Discussed r/b/se  -send PT to home.  She doesn't drive.     2.  Sialorrhea  -On Xeomin and it is working well.  Last injections September 15.  3.  RBD  -had one fall out of the bed since our last visit.  Told her to get bed rails from [place].  Using clonazepam, 0.5 mg, half tablet at bed.    4.  Constipation with what sounds like overflow incontinence  -saw GI NP - Treatment with benefiber recommended and helping  5.  Depression  -On venlafaxine.  Subjective:   [name] was seen today in follow up for Parkinsons disease.  My previous records were reviewed prior to todays visit as well as outside records available to me.   Patient continues on Xeomin for sialorrhea.  That seems to be working well.  She just had her last injections on September 15.  She is having dyskinesia and it has become more bothersome.  One fall and that was while sleeping.  Having back pain.    Current prescribed movement disorder medications: carbidopa/levodopa 25/100, 1 tablet at 7 AM/ 1/2 tablet at 9, 1/2 tablet at 11 AM/1/2 tablet at 1pm/1/2 tablet at 3 PM/1/2 tablet at 5pm/ 1/2 tablet at 7 PM  Clonazepam 0.5 mg, half tablet at bed (last filled 08/2020 for 90 days and just running out)  PREVIOUS MEDICATIONS: Amantadine (hallucinations); has clonazepam but rarely takes  ALLERGIES:   Allergies  Allergen Reactions   Penicillins Shortness Of Breath and Itching    CURRENT MEDICATIONS:  Outpatient Encounter Medications as of 10/19/2021  Medication Sig   b complex vitamins tablet Take 1 tablet by mouth daily.   Calcium 1500 MG tablet Take 1,500 mg by mouth.   carbidopa-levodopa (SINEMET IR) 25-100 MG tablet  1 tablet at 7 AM/ 1/2 tablet at 9, 1/2 tablet at 11 AM/1/2 tablet at 1pm/1/2 tablet at 3 PM/1/2 tablet at 5pm/ 1/2 tablet at 7 PM   Cholecalciferol 5000 units TABS Take 1 tablet (5,000 Units total) by mouth daily.   clonazePAM (KLONOPIN) 0.5 MG tablet TAKE 1/2 (ONE-HALF) TABLET BY MOUTH AT BEDTIME   Denosumab (PROLIA [place]) Inject into the skin every 6 (six) months.   Elastic Bandages & Supports (ABDOMINAL BINDER/ELASTIC SMALL) MISC 1 Device by Does not apply route daily.   methocarbamol (ROBAXIN) 500 MG tablet Take 1 tablet (500 mg total) by mouth every 8 (eight) hours as needed for muscle spasms.   Multiple Vitamin (MULTIVITAMIN) capsule 2 tabs po qd   Omega-3 Fatty Acids (OMEGA 3 PO) 1 tab po qd   omeprazole (PRILOSEC) 20 MG capsule TAKE (1) CAPSULE BY MOUTH ONCE DAILY.   OnabotulinumtoxinA (BOTOX IM) Inject into the muscle every 3 (three) months. Injection every 3 months   tamoxifen (NOLVADEX) 20 MG tablet TAKE 1 TABLET BY MOUTH ONCE DAILY.   venlafaxine XR (EFFEXOR-XR) 37.5 MG 24 hr capsule Take 3 capsules (112.5 mg total) by mouth daily with breakfast.   vitamin B-12 (CYANOCOBALAMIN) 500 MCG tablet Take 2500 mcg daily.   Facility-Administered Encounter Medications as of 10/19/2021  Medication   incobotulinumtoxinA (XEOMIN) 100 units injection 100 Units  incobotulinumtoxinA (XEOMIN) 100 units injection 100 Units   incobotulinumtoxinA (XEOMIN) 100 units injection 100 Units   incobotulinumtoxinA (XEOMIN) 100 units injection 100 Units    Objective:   PHYSICAL EXAMINATION:    VITALS:   Vitals:   10/19/21 0755  BP: 123/82  Pulse: 65  SpO2: 96%  Weight: 111 lb 9.6 oz (50.6 kg)  Height: '5\' 3"'$  (1.6 m)   GEN:  The patient appears stated age and is in NAD. HEENT:  Normocephalic, atraumatic.  The mucous membranes are moist. The superficial temporal arteries are without ropiness or tenderness. CV:  RRR Lungs:  CTAB Neck/HEME:  There are no carotid bruits bilaterally.  Neurological  examination:  Orientation: The patient is alert and oriented x3. Cranial nerves: There is good facial symmetry with facial hypomimia. The speech is fluent and hypophonic. Soft palate rises symmetrically and there is no tongue deviation. Hearing is intact to conversational tone. Sensation: Sensation is intact to light touch throughout Motor: Strength is at least antigravity x4.  Movement examination: Tone: There is nl tone in the UE/LE Abnormal movements: there is no tremor.  She has at least mod dyskinesia, L>R Coordination:  There is mild to mod decremation with RAM's, with finger taps and toe taps on the L Gait and Station: The patient has trouble OOC.  She is unbalanced.    Total time spent on today's visit was 22 minutes, including both face-to-face time and nonface-to-face time.  Time included that spent on review of records (prior notes available to me/labs/imaging if pertinent), discussing treatment and goals, answering patient's questions and coordinating care.  Cc:  Tonia Ghent, MD

## 2021-10-19 ENCOUNTER — Encounter: Payer: Self-pay | Admitting: Neurology

## 2021-10-19 ENCOUNTER — Other Ambulatory Visit: Payer: Self-pay

## 2021-10-19 ENCOUNTER — Ambulatory Visit (INDEPENDENT_AMBULATORY_CARE_PROVIDER_SITE_OTHER): Payer: Medicare Other | Admitting: Neurology

## 2021-10-19 DIAGNOSIS — G20 Parkinson's disease: Secondary | ICD-10-CM

## 2021-10-19 DIAGNOSIS — F330 Major depressive disorder, recurrent, mild: Secondary | ICD-10-CM

## 2021-10-19 MED ORDER — AMANTADINE HCL 100 MG PO CAPS: 100.0000 mg | ORAL_CAPSULE | Freq: Two times a day (BID) | ORAL | 1 refills | Status: DC

## 2021-10-19 MED ORDER — CLONAZEPAM 0.5 MG PO TABS: ORAL_TABLET | ORAL | 1 refills | Status: DC

## 2021-10-19 MED ORDER — CARBIDOPA-LEVODOPA 25-100 MG PO TABS: ORAL_TABLET | ORAL | 1 refills | Status: DC

## 2021-10-19 NOTE — Patient Instructions (Addendum)
ADD amantadine '100mg'$ , 1 at 7am and 1pm  Local and Online Resources for Power over Parkinson's Group September 2023  LOCAL [place] PARKINSON'S GROUPS  Power over Parkinson's Group:   Power Over Parkinson's Patient Education Group will be Wednesday, September 13th-*Hybrid meting*- in person at [facility] location and via [facility] at 2 pm.   Upcoming Power over [employer] Meetings:  2nd Wednesdays of the month at 2 pm:  September 13th, October 11th, November 8th Contact Amy Marriott at [email or web address] if interested in participating in this group Parkinson's Care Partners Group:    3rd Mondays, Contact Misty Paladino Atypical Parkinsonian Patient Group:   4th Wednesdays, Contact Misty Paladino If you are interested in participating in these groups with Misty, please contact her directly for how to join those meetings.  Her contact information is [email or web address].    LOCAL EVENTS AND NEW OFFERINGS New PWR! Moves [employer] Instructor-Led Classes offering at [employer]!  Wednesdays 1-2 pm.   Contact Vonna Kotyk at  [email or web address] or Caron Presume at [place], [email or web address] Dance for Parkinson 's classes will be on Tuesdays 9:30am-10:30am starting October 3-December 12 with a break the week of November 21 . Located in the December 04 which is in the first floor of the [employer]  ([place] for Parkinson's will be held on 2nd and 4th Mondays at 11:00am . First class will start  September 25th.  Located at the [place] ([place].) Through support from the [place] and Drumming for Parkinson's classes are free for both patients and caregivers.  Contact Misty Taylor-Paladino for more details about registering.  [place]:  www.[email or web address] PD Health at Home  continues:  Mindfulness Mondays, Wellness Wednesdays, Fitness Fridays  Upcoming Education:   Navigating Nutrition with PD.  Wednesday, Sept. 6th 1:00-2:00 pm Understanding Mind and Memory.  Wednesday, Sept. 20th 1:00-2:00 pm  Expert Briefing:    Parkinson's Disease and the Bladder.  Wednesday, Sept. 13th 1:00-2:00 pm Parkinson's and the Gut-Brain Connection.  Wednesday, Oct. 11th 1:00-2:00 pm Register for expert briefings (webinars) at 07-12-1974 Please check out their website to sign up for emails and see their full online offerings   [place]:  [email or web address]  Third Thursday Webinars:  On the third Thursday of every month at 12 p.m. ET, join our free live webinars to learn about various aspects of living with Parkinson's disease and our work to speed medical breakthroughs. Upcoming Webinar:  Stay tuned Check out additional information on their website to see their full online offerings  Tuesday:  [email or web address] Upcoming Webinar:   Stay tuned Webinar Series:  Living with Parkinson's Meetup.   Third Thursdays each month, 3 pm Care Partner Monthly Meetup.  With 03-11-1984 [place].  First Tuesday of each month, 2 pm Check out additional information to Live Well Today on their website  Parkinson and Movement Disorders (PMD) Alliance:  www.[email or web address] [employer] Online:  Online Education Events Sign up for emails, which are sent weekly to give you updates on programming and online offerings  Parkinson's Association of the [place]:  www.[email or web address] Information on online support groups, education events, and online exercises including Yoga, Parkinson's exercises and more-LOTS of information on links to PD resources and online events Virtual Support Group through Parkinson's Association of the [place]; next one is scheduled for Wednesday,  October 4th at 2 pm. (No September meeting due to the symposium.  These are typically scheduled for the 1st Wednesday of the month at 2 pm).  Visit website for details. Register for "Caring for Parkinson's-Caring for You", 9th Annual Symposium.  In-person event in [place].  September 9th.  To register:  [email or web address]/symposium-[email or web address]/?blm_aid=[email or web address] MOVEMENT AND EXERCISE OPPORTUNITIES PWR! Moves Classes at [place].  Wednesdays 10 and 11 am.   Contact Amy Marriott, PT [email or web address] if interested. NEW PWR! Moves Class offerings at [employer].  Wednesdays 1-2 pm.  Contact Vonna Kotyk at  [email or web address] or Caron Presume at [employer],  [email or web address] Parkinson's Wellness Recovery (PWR! Moves)  [email or web address] Info on the PWR! Virtual Experience:  You will have access to our expertise through self-assessment, guided plans that start with the PD-specific fundamentals, educational content, tips, Q&A with an expert, and a growing [employer] of PD-specific pre-recorded and live exercise classes of varying types and intensity - both physical and cognitive! If that is not enough, we offer 1:1 wellness consultations (in-person or virtual) to personalize your PWR! Art therapist.  [place] Fridays:  As part of the PD Health @ Home program, this free video series focuses each week on one aspect of fitness designed to support people living with Parkinson's.  These weekly videos highlight the [place] recent fitness guidelines for people with Parkinson's disease. [address] Dance for PD website is offering free, live-stream classes throughout the week, as well as links to [employer].si of classes:  [email or web address]/ Virtual dance and Pilates for Parkinson's classes: Click on the Community Tab> Parkinson's Movement Initiative Tab.   To register for classes and for more information, visit www.[employer] and click the "community" tab.  [employer] Parkinson's Cycling Classes  [employer]:  Thursdays @ Noon-Live classes at 03-11-1984 ([employer] at [email or web address] or [phone]) [employer]: Virtual Classes Mondays and Thursdays 03-11-1984 classes Tuesday, Wednesday and Thursday (contact [place] at [email or web address]  or [phone]) [place] Varied levels of classes are offered Tuesdays and Thursdays at [facility].  Stretching with [facility] weekly class is also offered for people with Parkinson's To observe a class or for more information, call [phone] or email [phone] at [email or web address] ADDITIONAL SUPPORT AND RESOURCES Well[employer]Spring [employer]:Online Caregiver Education Opportunities:  www.well-[email or web address]/caregiver-education/caregiver-support-group.  You may also contact Hezzie Bump at [email or web address] or [phone].    Coping with Difficult Caregiver Emotions.  Wednesday, September 20th, 10:30 am-12.  The [facility], [facility] Collective Navigating the [place] of Senior Care Options.  Thursday, September 28th, 4-5:15 pm.  The [facility]. Well-Spring Navigator:  07-25-1995 program, a free service to help individuals and families through the journey of determining care for older adults.  The "Navigator" is a [facility], [employer], who will speak with a prospective client and/or loved ones to provide an assessment of the situation and a set of recommendations for a personalized care plan -- all free of charge, and whether Well-Spring [employer] offers the needed service or not. If the need is not a service we provide, we are well-connected with reputable programs in town that we can refer you to.  www.well-springsolutions.org or to speak with the Navigator, call [phone].

## 2021-10-26 DIAGNOSIS — Z23 Encounter for immunization: Secondary | ICD-10-CM | POA: Diagnosis not present

## 2021-10-27 ENCOUNTER — Telehealth: Payer: Self-pay | Admitting: Medical Oncology

## 2021-10-27 ENCOUNTER — Other Ambulatory Visit: Payer: Self-pay | Admitting: Internal Medicine

## 2021-10-27 NOTE — Telephone Encounter (Signed)
LVM that someone will check with [name] if he wants her to continue Prolia q 6 months.

## 2021-10-28 ENCOUNTER — Other Ambulatory Visit: Payer: Self-pay

## 2021-10-28 DIAGNOSIS — G20B1 Parkinson's disease with dyskinesia, without mention of fluctuations: Secondary | ICD-10-CM

## 2021-10-29 ENCOUNTER — Telehealth: Payer: Self-pay | Admitting: Internal Medicine

## 2021-10-29 NOTE — Telephone Encounter (Signed)
Contacted patient to scheduled appointments. Patient is aware of appointments that are scheduled.   

## 2021-11-02 ENCOUNTER — Other Ambulatory Visit: Payer: Self-pay

## 2021-11-02 ENCOUNTER — Inpatient Hospital Stay: Payer: Medicare Other

## 2021-11-02 ENCOUNTER — Inpatient Hospital Stay (HOSPITAL_BASED_OUTPATIENT_CLINIC_OR_DEPARTMENT_OTHER): Payer: Medicare Other | Admitting: Internal Medicine

## 2021-11-02 ENCOUNTER — Inpatient Hospital Stay: Payer: Medicare Other | Attending: Internal Medicine

## 2021-11-02 VITALS — BP 141/89 | HR 91 | Temp 97.5°F | Resp 16 | Wt 111.8 lb

## 2021-11-02 DIAGNOSIS — Z9049 Acquired absence of other specified parts of digestive tract: Secondary | ICD-10-CM | POA: Insufficient documentation

## 2021-11-02 DIAGNOSIS — Z85828 Personal history of other malignant neoplasm of skin: Secondary | ICD-10-CM | POA: Diagnosis not present

## 2021-11-02 DIAGNOSIS — Z90722 Acquired absence of ovaries, bilateral: Secondary | ICD-10-CM | POA: Insufficient documentation

## 2021-11-02 DIAGNOSIS — Z8719 Personal history of other diseases of the digestive system: Secondary | ICD-10-CM | POA: Insufficient documentation

## 2021-11-02 DIAGNOSIS — R499 Unspecified voice and resonance disorder: Secondary | ICD-10-CM | POA: Diagnosis not present

## 2021-11-02 DIAGNOSIS — R5383 Other fatigue: Secondary | ICD-10-CM | POA: Diagnosis not present

## 2021-11-02 DIAGNOSIS — G20A1 Parkinson's disease without dyskinesia, without mention of fluctuations: Secondary | ICD-10-CM | POA: Insufficient documentation

## 2021-11-02 DIAGNOSIS — Z79810 Long term (current) use of selective estrogen receptor modulators (SERMs): Secondary | ICD-10-CM | POA: Insufficient documentation

## 2021-11-02 DIAGNOSIS — Z853 Personal history of malignant neoplasm of breast: Secondary | ICD-10-CM | POA: Insufficient documentation

## 2021-11-02 DIAGNOSIS — Z880 Allergy status to penicillin: Secondary | ICD-10-CM | POA: Diagnosis not present

## 2021-11-02 DIAGNOSIS — D472 Monoclonal gammopathy: Secondary | ICD-10-CM | POA: Diagnosis not present

## 2021-11-02 DIAGNOSIS — Z79899 Other long term (current) drug therapy: Secondary | ICD-10-CM | POA: Insufficient documentation

## 2021-11-02 DIAGNOSIS — Z923 Personal history of irradiation: Secondary | ICD-10-CM | POA: Insufficient documentation

## 2021-11-02 DIAGNOSIS — I341 Nonrheumatic mitral (valve) prolapse: Secondary | ICD-10-CM | POA: Insufficient documentation

## 2021-11-02 DIAGNOSIS — C50011 Malignant neoplasm of nipple and areola, right female breast: Secondary | ICD-10-CM

## 2021-11-02 DIAGNOSIS — R531 Weakness: Secondary | ICD-10-CM | POA: Insufficient documentation

## 2021-11-02 DIAGNOSIS — R251 Tremor, unspecified: Secondary | ICD-10-CM | POA: Insufficient documentation

## 2021-11-02 DIAGNOSIS — J029 Acute pharyngitis, unspecified: Secondary | ICD-10-CM | POA: Diagnosis not present

## 2021-11-02 LAB — CBC WITH DIFFERENTIAL (CANCER CENTER ONLY)
Abs Immature Granulocytes: 0.02 10*3/uL (ref 0.00–0.07)
Basophils Absolute: 0.1 10*3/uL (ref 0.0–0.1)
Basophils Relative: 1 %
Eosinophils Absolute: 0.2 10*3/uL (ref 0.0–0.5)
Eosinophils Relative: 3 %
HCT: 37.9 % (ref 36.0–46.0)
Hemoglobin: 12.8 g/dL (ref 12.0–15.0)
Immature Granulocytes: 0 %
Lymphocytes Relative: 28 %
Lymphs Abs: 2 10*3/uL (ref 0.7–4.0)
MCH: 33.8 pg (ref 26.0–34.0)
MCHC: 33.8 g/dL (ref 30.0–36.0)
MCV: 100 fL (ref 80.0–100.0)
Monocytes Absolute: 0.3 10*3/uL (ref 0.1–1.0)
Monocytes Relative: 4 %
Neutro Abs: 4.6 10*3/uL (ref 1.7–7.7)
Neutrophils Relative %: 64 %
Platelet Count: 206 10*3/uL (ref 150–400)
RBC: 3.79 MIL/uL — ABNORMAL LOW (ref 3.87–5.11)
RDW: 13.5 % (ref 11.5–15.5)
WBC Count: 7.2 10*3/uL (ref 4.0–10.5)
nRBC: 0 % (ref 0.0–0.2)

## 2021-11-02 LAB — CMP (CANCER CENTER ONLY)
ALT: <5 U/L (ref 0–44)
AST: 21 U/L (ref 15–41)
Albumin: 3.8 g/dL (ref 3.5–5.0)
Alkaline Phosphatase: 54 U/L (ref 38–126)
Anion gap: 5 (ref 5–15)
BUN: 14 mg/dL (ref 8–23)
CO2: 33 mmol/L — ABNORMAL HIGH (ref 22–32)
Calcium: 9 mg/dL (ref 8.9–10.3)
Chloride: 104 mmol/L (ref 98–111)
Creatinine: 0.69 mg/dL (ref 0.44–1.00)
GFR, Estimated: >60 mL/min (ref >60)
Glucose, Bld: 93 mg/dL (ref 70–99)
Potassium: 3.7 mmol/L (ref 3.5–5.1)
Sodium: 142 mmol/L (ref 135–145)
Total Bilirubin: 0.4 mg/dL (ref 0.3–1.2)
Total Protein: 7.5 g/dL (ref 6.5–8.1)

## 2021-11-02 LAB — LACTATE DEHYDROGENASE: LDH: 150 U/L (ref 98–192)

## 2021-11-02 MED ORDER — DENOSUMAB 60 MG/ML ~~LOC~~ SOSY
60.0000 mg | PREFILLED_SYRINGE | Freq: Once | SUBCUTANEOUS | Status: AC
  Administered 2021-11-02: 60 mg via SUBCUTANEOUS
  Filled 2021-11-02: qty 1

## 2021-11-02 NOTE — Progress Notes (Signed)
[facility] [phone]   [phone]  OFFICE PROGRESS NOTE  Tonia Ghent, MD [name] [address]  DIAGNOSIS:  1) Node-negative breast cancer status post completion of radiation 05/06/2010 currently on tamoxifen as well as prolia.  2) Monoclonal gammopathy of unknown significance on observation since 2007   PRIOR THERAPY:  1) status post right lumpectomy on 03/05/2010 and it showed invasive ductal carcinoma 0.9 CM, Positive for ER/PR and negative for HER-2, with ductal carcinoma in situ present and negative sentinel lymph node biopsies.  2) status post adjuvant radiotherapy completed in April of 2012   CURRENT THERAPY:  1) tamoxifen 20 mg by mouth daily started in 2012.  2) Prolia subcutaneous injection every 6 months.  INTERVAL HISTORY: [name] 81 y.o. female returns to the clinic today for 6 months follow-up visit.  The patient is feeling fine today with no concerning complaints except for occasional sore throat and weakness of her voice that comes and goes.  She denied having any chest pain, shortness of breath, cough or hemoptysis.  She denied having any fever or chills.  She has no nausea, vomiting, diarrhea or constipation.  She has no headache or visual changes.  She is here today for evaluation with repeat myeloma panel.  MEDICAL HISTORY: Past Medical History:  Diagnosis Date   Allergy    SEASONAL   Anemia    Anemia-NOS / PMH, Dr Julien Nordmann   Arthritis    Breast cancer ([facility])    stage I right breast   Cataract    BILATERAL   Decreased vision    R eye   Esophageal Stricture    Hyperlipidemia    Monoclonal gammopathy    MVP (mitral valve prolapse)    Osteoporosis    Parkinson disease (H[facility])    Skin cancer    basal cell L neck   Vertical diplopia     ALLERGIES:  is allergic to penicillins.  MEDICATIONS:  Current Outpatient Medications  Medication Sig Dispense Refill   amantadine  (SYMMETREL) 100 MG capsule Take 1 capsule (100 mg total) by mouth 2 (two) times daily. 1 at 7am and 1 at 1pm 180 capsule 1   b complex vitamins tablet Take 1 tablet by mouth daily.     Calcium 1500 MG tablet Take 1,500 mg by mouth.     carbidopa-levodopa (SINEMET IR) 25-100 MG tablet 1 at 7 AM/ 1/2 at 9, 1/2 at 11 AM/1/2 at 1pm/1/2 at 3 PM/1/2 at 5pm/ 1/2 at 7 PM 360 tablet 1   Cholecalciferol 5000 units TABS Take 1 tablet (5,000 Units total) by mouth daily.     clonazePAM (KLONOPIN) 0.5 MG tablet TAKE 1/2 (ONE-HALF) TABLET BY MOUTH AT BEDTIME 45 tablet 1   Denosumab (PROLIA [place]) Inject into the skin every 6 (six) months.     Elastic Bandages & Supports (ABDOMINAL BINDER/ELASTIC SMALL) MISC 1 Device by Does not apply route daily. 1 each 0   methocarbamol (ROBAXIN) 500 MG tablet Take 1 tablet (500 mg total) by mouth every 8 (eight) hours as needed for muscle spasms. 90 tablet 3   Multiple Vitamin (MULTIVITAMIN) capsule 2 tabs po qd     Omega-3 Fatty Acids (OMEGA 3 PO) 1 tab po qd     omeprazole (PRILOSEC) 20 MG capsule TAKE (1) CAPSULE BY MOUTH ONCE DAILY. 90 capsule 0   OnabotulinumtoxinA (BOTOX IM) Inject into the muscle every 3 (three) months. Injection every  3 months     tamoxifen (NOLVADEX) 20 MG tablet TAKE 1 TABLET BY MOUTH ONCE DAILY. 90 tablet 11   venlafaxine XR (EFFEXOR-XR) 37.5 MG 24 hr capsule Take 3 capsules (112.5 mg total) by mouth daily with breakfast. 90 capsule 12   vitamin B-12 (CYANOCOBALAMIN) 500 MCG tablet Take 2500 mcg daily.     Current Facility-Administered Medications  Medication Dose Route Frequency Provider Last Rate Last Admin   incobotulinumtoxinA (XEOMIN) 100 units injection 100 Units  100 Units Intramuscular Q90 days Tat, Eustace Quail, DO   100 Units at 09/26/20 5409   incobotulinumtoxinA (XEOMIN) 100 units injection 100 Units  100 Units Intramuscular Q90 days Tat, Eustace Quail, DO   100 Units at 01/30/21 8119   incobotulinumtoxinA (XEOMIN) 100 units injection 100 Units   100 Units Intramuscular Q90 days Tat, Eustace Quail, DO   100 Units at 06/05/21 1478   incobotulinumtoxinA (XEOMIN) 100 units injection 100 Units  100 Units Intramuscular Q90 days Tat, Eustace Quail, DO   100 Units at 10/09/21 0816    SURGICAL HISTORY:  Past Surgical History:  Procedure Laterality Date   APPENDECTOMY  2002   BONE BIOPSY  11/06/10   BREAST LUMPECTOMY  02/2010   right breast lumpectomy and sentinel node biopsy   CARDIAC CATHETERIZATION  2005   neg   CATARACT EXTRACTION, BILATERAL  2018   COLONOSCOPY     negative    HEMORRHOID SURGERY  1970's   SALPINGOOPHORECTOMY  2002   for benign growths   SHOULDER SURGERY Right    TOTAL ABDOMINAL HYSTERECTOMY W/ BILATERAL SALPINGOOPHORECTOMY  81 yrs old   for pain,prolapse ; G3 P2   UPPER GASTROINTESTINAL ENDOSCOPY      REVIEW OF SYSTEMS:  A comprehensive review of systems was negative except for: Constitutional: positive for fatigue Ears, nose, mouth, throat, and face: positive for voice change Neurological: positive for tremors   PHYSICAL EXAMINATION: General appearance: alert, cooperative, fatigued, and no distress Head: Normocephalic, without obvious abnormality, atraumatic Neck: no adenopathy, no JVD, supple, symmetrical, trachea midline, and thyroid not enlarged, symmetric, no tenderness/mass/nodules Lymph nodes: Cervical, supraclavicular, and axillary nodes normal. Resp: clear to auscultation bilaterally Back: symmetric, no curvature. ROM normal. No CVA tenderness. Cardio: regular rate and rhythm, S1, S2 normal, no murmur, click, rub or gallop GI: soft, non-tender; bowel sounds normal; no masses,  no organomegaly Extremities: extremities normal, atraumatic, no cyanosis or edema  ECOG PERFORMANCE STATUS: 1 - Symptomatic but completely ambulatory  Blood pressure (!) 141/89, pulse 91, temperature (!) 97.5 F (36.4 C), temperature source Oral, resp. rate 16, weight 111 lb 12.8 oz (50.7 kg), SpO2 98 %.  LABORATORY DATA: Lab  Results  Component Value Date   WBC 7.2 11/02/2021   HGB 12.8 11/02/2021   HCT 37.9 11/02/2021   MCV 100.0 11/02/2021   PLT 206 11/02/2021      Chemistry      Component Value Date/Time   NA 141 05/11/2021 1020   NA 141 10/13/2016 1141   K 3.8 05/11/2021 1020   K 3.6 10/13/2016 1141   CL 107 05/11/2021 1020   CL 107 03/21/2012 1501   CO2 29 05/11/2021 1020   CO2 27 10/13/2016 1141   BUN 15 05/11/2021 1020   BUN 20.0 10/13/2016 1141   CREATININE 0.68 05/11/2021 1020   CREATININE 0.8 10/13/2016 1141      Component Value Date/Time   CALCIUM 8.3 (L) 05/11/2021 1020   CALCIUM 9.5 10/13/2016 1141   ALKPHOS 43 05/11/2021  1020   ALKPHOS 50 10/13/2016 1141   AST 20 05/11/2021 1020   AST 25 10/13/2016 1141   ALT <5 05/11/2021 1020   ALT 17 10/13/2016 1141   BILITOT 0.6 05/11/2021 1020   BILITOT 0.27 10/13/2016 1141       RADIOGRAPHIC STUDIES: No results found.  ASSESSMENT AND PLAN:  This is a very pleasant 81 years old white female with history of breast adenocarcinoma currently on tamoxifen as well as history of monoclonal gammopathy of undetermined significance currently on observation. Her most recent myeloma panel showed increase in the free kappa light chain. The bone biopsy showed cellular marrow with kappa restricted plasmacytosis averaging 10-15%.  The findings were characteristic of smoldering multiple myeloma in this patient who is asymptomatic and no endorgan damage. The patient is feeling fine today with no concerning complaints.  She had repeat myeloma panel performed earlier today.  Her CBC is unremarkable but the remaining myeloma panel is still pending. I recommended for the patient to continue on observation with repeat myeloma panel in 1 year. For the osteoporosis, she will continue her current treatment with Prolia every 6 months.  She will need only basic metabolic panel before her treatment with Prolia. For the Parkinson's disease, she is followed by  neurology and currently on carbidopa/levodopa. I will see the patient back for follow-up visit in 1 year. She was advised to call immediately if she has any other concerning symptoms in the interval. The patient voices understanding of current disease status and treatment options and is in agreement with the current care plan. All questions were answered. The patient knows to call the clinic with any problems, questions or concerns. We can certainly see the patient much sooner if necessary.   Disclaimer: This note was dictated with voice recognition software. Similar sounding words can inadvertently be transcribed and may not be corrected upon review.

## 2021-11-03 ENCOUNTER — Telehealth: Payer: Self-pay | Admitting: Internal Medicine

## 2021-11-03 DIAGNOSIS — K117 Disturbances of salivary secretion: Secondary | ICD-10-CM | POA: Diagnosis not present

## 2021-11-03 DIAGNOSIS — K5900 Constipation, unspecified: Secondary | ICD-10-CM | POA: Diagnosis not present

## 2021-11-03 DIAGNOSIS — F330 Major depressive disorder, recurrent, mild: Secondary | ICD-10-CM | POA: Diagnosis not present

## 2021-11-03 DIAGNOSIS — Z9181 History of falling: Secondary | ICD-10-CM | POA: Diagnosis not present

## 2021-11-03 DIAGNOSIS — G20A1 Parkinson's disease without dyskinesia, without mention of fluctuations: Secondary | ICD-10-CM | POA: Diagnosis not present

## 2021-11-03 DIAGNOSIS — G4752 REM sleep behavior disorder: Secondary | ICD-10-CM | POA: Diagnosis not present

## 2021-11-03 LAB — IGG, IGA, IGM
IgA: 39 mg/dL — ABNORMAL LOW (ref 64–422)
IgG (Immunoglobin G), Serum: 1918 mg/dL — ABNORMAL HIGH (ref 586–1602)
IgM (Immunoglobulin M), Srm: 21 mg/dL — ABNORMAL LOW (ref 26–217)

## 2021-11-03 LAB — KAPPA/LAMBDA LIGHT CHAINS
Kappa free light chain: 131.4 mg/L — ABNORMAL HIGH (ref 3.3–19.4)
Kappa, lambda light chain ratio: 12.63 — ABNORMAL HIGH (ref 0.26–1.65)
Lambda free light chains: 10.4 mg/L (ref 5.7–26.3)

## 2021-11-03 LAB — BETA 2 MICROGLOBULIN, SERUM: Beta-2 Microglobulin: 1.4 mg/L (ref 0.6–2.4)

## 2021-11-03 NOTE — Telephone Encounter (Signed)
Scheduled per 10/09 los, patient has been called and notified of upcoming appointments. 

## 2021-11-10 DIAGNOSIS — K117 Disturbances of salivary secretion: Secondary | ICD-10-CM | POA: Diagnosis not present

## 2021-11-10 DIAGNOSIS — K5900 Constipation, unspecified: Secondary | ICD-10-CM | POA: Diagnosis not present

## 2021-11-10 DIAGNOSIS — G20A1 Parkinson's disease without dyskinesia, without mention of fluctuations: Secondary | ICD-10-CM | POA: Diagnosis not present

## 2021-11-10 DIAGNOSIS — Z9181 History of falling: Secondary | ICD-10-CM | POA: Diagnosis not present

## 2021-11-10 DIAGNOSIS — G4752 REM sleep behavior disorder: Secondary | ICD-10-CM | POA: Diagnosis not present

## 2021-11-10 DIAGNOSIS — F330 Major depressive disorder, recurrent, mild: Secondary | ICD-10-CM | POA: Diagnosis not present

## 2021-11-11 ENCOUNTER — Ambulatory Visit (INDEPENDENT_AMBULATORY_CARE_PROVIDER_SITE_OTHER): Payer: Medicare Other | Admitting: *Deleted

## 2021-11-11 DIAGNOSIS — Z0000 Encounter for general adult medical examination without abnormal findings: Secondary | ICD-10-CM

## 2021-11-11 NOTE — Progress Notes (Signed)
Subjective:   [name] is a 81 y.o. female who presents for Medicare Annual (Subsequent) preventive examination.  I connected with  Alphonzo Lemmings on 11/11/21 by at telephone enabled telemedicine application and verified that I am speaking with the correct person using two identifiers.   I discussed the limitations of evaluation and management by telemedicine. The patient expressed understanding and agreed to proceed.  Patient location: home  Provider location: Tele-health-home    Review of Systems     Cardiac Risk Factors include: advanced age (>66mn, >>80women);family history of premature cardiovascular disease     Objective:    Today's Vitals   There is no height or weight on file to calculate BMI.     11/11/2021   10:17 AM 10/19/2021    7:54 AM 03/24/2021   10:56 AM 11/17/2020    9:51 AM 11/10/2020    7:22 AM 09/11/2020    8:31 AM 04/18/2020    8:01 AM  Advanced Directives  Does Patient Have a Medical Advance Directive? No Yes No Yes Yes Yes Yes  Type of AScientist, research (medical)Living will HClovisLiving will  [place] will  Does patient want to make changes to medical advance directive?     No - Patient declined    Copy of [place] Chart?    No - copy requested No - copy requested    Would patient like information on creating a medical advance directive? No - Patient declined          Current Medications (verified) Outpatient Encounter Medications as of 11/11/2021  Medication Sig   amantadine (SYMMETREL) 100 MG capsule Take 1 capsule (100 mg total) by mouth 2 (two) times daily. 1 at 7am and 1 at 1pm   b complex vitamins tablet Take 1 tablet by mouth daily.   Calcium 1500 MG tablet Take 1,500 mg by mouth.   carbidopa-levodopa (SINEMET IR) 25-100 MG tablet 1 at 7 AM/ 1/2 at 9, 1/2 at 11 AM/1/2 at 1pm/1/2 at 3 PM/1/2 at 5pm/ 1/2 at 7 PM   Cholecalciferol 5000  units TABS Take 1 tablet (5,000 Units total) by mouth daily.   clonazePAM (KLONOPIN) 0.5 MG tablet TAKE 1/2 (ONE-HALF) TABLET BY MOUTH AT BEDTIME   Denosumab (PROLIA [place]) Inject into the skin every 6 (six) months.   Elastic Bandages & Supports (ABDOMINAL BINDER/ELASTIC SMALL) MISC 1 Device by Does not apply route daily.   methocarbamol (ROBAXIN) 500 MG tablet Take 1 tablet (500 mg total) by mouth every 8 (eight) hours as needed for muscle spasms.   Multiple Vitamin (MULTIVITAMIN) capsule 2 tabs po qd   Omega-3 Fatty Acids (OMEGA 3 PO) 1 tab po qd   omeprazole (PRILOSEC) 20 MG capsule TAKE (1) CAPSULE BY MOUTH ONCE DAILY.   OnabotulinumtoxinA (BOTOX IM) Inject into the muscle every 3 (three) months. Injection every 3 months   tamoxifen (NOLVADEX) 20 MG tablet TAKE 1 TABLET BY MOUTH ONCE DAILY.   venlafaxine XR (EFFEXOR-XR) 37.5 MG 24 hr capsule Take 3 capsules (112.5 mg total) by mouth daily with breakfast.   vitamin B-12 (CYANOCOBALAMIN) 500 MCG tablet Take 2500 mcg daily.   Facility-Administered Encounter Medications as of 11/11/2021  Medication   incobotulinumtoxinA (XEOMIN) 100 units injection 100 Units   incobotulinumtoxinA (XEOMIN) 100 units injection 100 Units   incobotulinumtoxinA (XEOMIN) 100 units injection 100 Units   incobotulinumtoxinA (XEOMIN) 100 units injection 100 Units  Allergies (verified) Penicillins   History: Past Medical History:  Diagnosis Date   Allergy    SEASONAL   Anemia    Anemia-NOS / PMH, Dr Julien Nordmann   Arthritis    Breast cancer [facility])    stage I right breast   Cataract    BILATERAL   Decreased vision    R eye   Esophageal Stricture    Hyperlipidemia    Monoclonal gammopathy    MVP (mitral valve prolapse)    Osteoporosis    Parkinson disease    Skin cancer    basal cell L neck   Vertical diplopia    Past Surgical History:  Procedure Laterality Date   APPENDECTOMY  2002   BONE BIOPSY  11/06/10   BREAST LUMPECTOMY  02/2010   right breast  lumpectomy and sentinel node biopsy   CARDIAC CATHETERIZATION  2005   neg   CATARACT EXTRACTION, BILATERAL  2018   COLONOSCOPY     negative    HEMORRHOID SURGERY  1970's   SALPINGOOPHORECTOMY  2002   for benign growths   SHOULDER SURGERY Right    TOTAL ABDOMINAL HYSTERECTOMY W/ BILATERAL SALPINGOOPHORECTOMY  81 yrs old   for pain,prolapse ; G3 P2   UPPER GASTROINTESTINAL ENDOSCOPY     Family History  Problem Relation Age of Onset   Diabetes Mother    Heart disease Mother    Diabetes Brother    Heart disease Brother    Hepatitis Father    Diabetes Brother    Diabetes Brother    Breast cancer Neg Hx    Colon cancer Neg Hx    Social History   Socioeconomic History   Marital status: Widowed    Spouse name: Not on file   Number of children: 2   Years of education: Not on file   Highest education level: Not on file  Occupational History   Occupation: Retired  Tobacco Use   Smoking status: Never   Smokeless tobacco: Never  Vaping Use   Vaping Use: Never used  Substance and Sexual Activity   Alcohol use: Not Currently    Alcohol/week: 1.0 standard drink of alcohol    Types: 1 Glasses of wine per week   Drug use: No   Sexual activity: Not Currently  Other Topics Concern   Not on file  Social History Narrative   Occupation: Textile   Widowed 2016, husband had cancer, was married in 1959    Patient has never smoked.    Illicit Drug Use - no   Patient does not get regular exercise.    Daily Caffeine Use: 1 cup of coffee daily   Right Handed   Lives in a one story home   Social Determinants of Health   Financial Resource Strain: Low Risk  (11/11/2021)   Overall Financial Resource Strain (CARDIA)    Difficulty of Paying Living Expenses: Not hard at all  Food Insecurity: No Food Insecurity (11/11/2021)   Hunger Vital Sign    Worried About Running Out of Food in the Last Year: Never true    Ran Out of Food in the Last Year: Never true  Transportation Needs: No  Transportation Needs (01/29/2019)   PRAPARE - Hydrologist (Medical): No    Lack of Transportation (Non-Medical): No  Physical Activity: Insufficiently Active (11/11/2021)   Exercise Vital Sign    Days of Exercise per Week: 4 days    Minutes of Exercise per Session: 30 min  Stress: No Stress Concern Present (11/11/2021)   [place]    Feeling of Stress : Not at all  Social Connections: Socially Isolated (11/11/2021)   Social Connection and Isolation Panel [NHANES]    Frequency of Communication with Friends and Family: Three times a week    Frequency of Social Gatherings with Friends and Family: Three times a week    Attends Religious Services: Never    Active Member of Clubs or Organizations: No    Attends Archivist Meetings: Never    Marital Status: Widowed    Tobacco Counseling Counseling given: Not Answered   Clinical Intake:  Pre-visit preparation completed: Yes  Pain : No/denies pain     Diabetes: No  How often do you need to have someone help you when you read instructions, pamphlets, or other written materials from your doctor or pharmacy?: 1 - Never  Diabetic?  no  Interpreter Needed?: No  Information entered by :: Leroy Kennedy LPN   Activities of Daily Living    11/11/2021   10:18 AM  In your present state of health, do you have any difficulty performing the following activities:  Hearing? 0  Vision? 0  Difficulty concentrating or making decisions? 0  Walking or climbing stairs? 1  Dressing or bathing? 0  Doing errands, shopping? 0  Preparing Food and eating ? N  Using the Toilet? N  In the past six months, have you accidently leaked urine? Y  Do you have problems with loss of bowel control? N  Managing your Medications? N  Managing your Finances? N  Housekeeping or managing your Housekeeping? N    Patient Care Team: Tonia Ghent, MD as  PCP - General (Family Medicine) Tat, Eustace Quail, DO as Consulting Physician (Neurology) Danice Goltz, MD as Consulting Physician (Ophthalmology)  Indicate any recent Medical Services you may have received from other than [facility] providers in the past year (date may be approximate).     Assessment:   This is a routine wellness examination for [place].  Hearing/Vision screen Hearing Screening - Comments:: No trouble hearing Vision Screening - Comments:: [facility] Up to date  Dietary issues and exercise activities discussed: Current Exercise Habits: Home exercise routine, Type of exercise: walking, Time (Minutes): 30, Frequency (Times/Week): 4, Weekly Exercise (Minutes/Week): 120, Intensity: Mild   Goals Addressed             This Visit's Progress    Increase physical activity         Depression Screen    11/11/2021   10:11 AM 11/03/2020   11:00 AM 01/29/2019    2:03 PM 01/16/2018   11:09 AM 01/13/2017   11:36 AM 10/17/2015   10:58 AM 01/30/2015    1:06 PM  PHQ 2/9 Scores  PHQ - 2 Score 0 0 0 0 0 0 0  PHQ- 9 Score 2 0 0 0 0      Fall Risk    11/11/2021   10:17 AM 10/19/2021    7:54 AM 03/24/2021   10:56 AM 09/11/2020    8:31 AM 04/18/2020    8:01 AM  [place] in the past year? 0 0 1 0 1  Number falls in past yr: 0 0 0 0 1  Injury with Fall? 0 0 1 0 0  Risk for fall due to : Impaired balance/gait      Follow up Falls evaluation completed;Education provided;Falls prevention discussed  FALL RISK PREVENTION PERTAINING TO THE HOME:  Any stairs in or around the home? Yes  If so, are there any without handrails? No  Home free of loose throw rugs in walkways, pet beds, electrical cords, etc? Yes  Adequate lighting in your home to reduce risk of falls? Yes   ASSISTIVE DEVICES UTILIZED TO PREVENT FALLS:  Life alert? No  Use of a cane, walker or w/c? Yes  Grab bars in the bathroom? Yes  Shower chair or bench in shower? No  Elevated toilet seat or a  handicapped toilet? No   TIMED UP AND GO:  Was the test performed? No .    Cognitive Function:    01/29/2019    2:05 PM 01/16/2018   11:08 AM 01/13/2017   11:39 AM  MMSE - Mini Mental State Exam  Orientation to time '5 5 5  '$ Orientation to Place '5 5 5  '$ Registration '3 3 3  '$ Attention/ Calculation 5 0 0  Recall '3 3 3  '$ Language- name 2 objects  0 0  Language- repeat '1 1 1  '$ Language- follow 3 step command  3 3  Language- read & follow direction  0 0  Write a sentence  0 0  Copy design  0 0  Total score  20 20        11/11/2021   10:09 AM  6CIT Screen  What Year? 0 points  What month? 0 points  What time? 0 points  Count back from 20 0 points  Months in reverse 0 points  Repeat phrase 0 points  Total Score 0 points    Immunizations Immunization History  Administered Date(s) Administered   Fluad Quad(high Dose 65+) 10/09/2018   Influenza Split 01/06/2011, 12/01/2011   Influenza Whole 02/21/2009   Influenza,inj,Quad PF,6+ Mos 10/31/2012, 10/08/2013, 10/08/2014, 09/26/2015, 10/06/2016, 10/03/2017   Influenza,inj,quad, With Preservative 09/27/2016, 10/09/2018   Influenza-Unspecified 12/24/2019, 10/22/2020   Moderna Sars-Covid-2 Vaccination 02/09/2019, 03/09/2019, 11/19/2019   Pneumococcal Conjugate-13 07/18/2014   Pneumococcal Polysaccharide-23 03/04/2011   Td 01/26/2019   Zoster Recombinat (Shingrix) 10/08/2017, 12/07/2017   Zoster, Live 10/26/2012    TDAP status: Up to date  Flu Vaccine status: Due, Education has been provided regarding the importance of this vaccine. Advised may receive this vaccine at local pharmacy or Health Dept. Aware to provide a copy of the vaccination record if obtained from local pharmacy or Health Dept. Verbalized acceptance and understanding.  Pneumococcal vaccine status: Up to date  Covid-19 vaccine status: Information provided on how to obtain vaccines.   Qualifies for Shingles Vaccine? No   Zostavax completed Yes   Shingrix  Completed?: Yes  Screening Tests Health Maintenance  Topic Date Due   INFLUENZA VACCINE  08/25/2021   COVID-19 Vaccine (4 - Moderna risk series) 11/27/2021 (Originally 01/14/2020)   TETANUS/TDAP  01/25/2029   Pneumonia Vaccine 102+ Years old  Completed   DEXA SCAN  Completed   Zoster Vaccines- Shingrix  Completed   HPV VACCINES  Aged Out    Health Maintenance  Health Maintenance Due  Topic Date Due   INFLUENZA VACCINE  08/25/2021    Colorectal cancer screening: No longer required.   Mammogram status: Completed  . Repeat every year    Lung Cancer Screening: (Low Dose CT Chest recommended if Age 86-80 years, 30 pack-year currently smoking OR have quit w/in 15years.) does not qualify.   Lung Cancer Screening Referral:   Additional Screening:  Hepatitis C Screening: does not qualify; Completed   Vision Screening:  Recommended annual ophthalmology exams for early detection of glaucoma and other disorders of the eye. Is the patient up to date with their annual eye exam?  Yes  Who is the provider or what is the name of the office in which the patient attends annual eye exams? [facility] If pt is not established with a provider, would they like to be referred to a provider to establish care? No .   Dental Screening: Recommended annual dental exams for proper oral hygiene  Community Resource Referral / Chronic Care Management: CRR required this visit?  No   CCM required this visit?  No      Plan:     I have personally reviewed and noted the following in the patient's chart:   Medical and social history Use of alcohol, tobacco or illicit drugs  Current medications and supplements including opioid prescriptions. Patient is not currently taking opioid prescriptions. Functional ability and status Nutritional status Physical activity Advanced directives List of other physicians Hospitalizations, surgeries, and ER visits in previous 12 months Vitals Screenings to include  cognitive, depression, and falls Referrals and appointments  In addition, I have reviewed and discussed with patient certain preventive protocols, quality metrics, and best practice recommendations. A written personalized care plan for preventive services as well as general preventive health recommendations were provided to patient.     Leroy Kennedy, LPN   [id number]   Nurse Notes:

## 2021-11-11 NOTE — Patient Instructions (Signed)
[name] , Thank you for taking time to come for your Medicare Wellness Visit. I appreciate your ongoing commitment to your health goals. Please review the following plan we discussed and let me know if I can assist you in the future.   These are the goals we discussed:  Goals      Increase physical activity     Starting 01/16/2018 I will continue to exercise for 120 minutes 5 days per week.      Increase physical activity     Patient Stated     01/29/2019, I will try to start back exercising after the pandemic is under control.         This is a list of the screening recommended for you and due dates:  Health Maintenance  Topic Date Due   Flu Shot  08/25/2021   COVID-19 Vaccine (4 - Moderna risk series) 11/27/2021*   Tetanus Vaccine  01/25/2029   Pneumonia Vaccine  Completed   DEXA scan (bone density measurement)  Completed   Zoster (Shingles) Vaccine  Completed   HPV Vaccine  Aged Out  *Topic was postponed. The date shown is not the original due date.    Advanced directives: Education provided  Conditions/risks identified:      Preventive Care 80 Years and Older, Female Preventive care refers to lifestyle choices and visits with your health care provider that can promote health and wellness. What does preventive care include? A yearly physical exam. This is also called an annual well check. Dental exams once or twice a year. Routine eye exams. Ask your health care provider how often you should have your eyes checked. Personal lifestyle choices, including: Daily care of your teeth and gums. Regular physical activity. Eating a healthy diet. Avoiding tobacco and drug use. Limiting alcohol use. Practicing safe sex. Taking low-dose aspirin every day. Taking vitamin and mineral supplements as recommended by your health care provider. What happens during an annual well check? The services and screenings done by your health care provider during your annual well check  will depend on your age, overall health, lifestyle risk factors, and family history of disease. Counseling  Your health care provider may ask you questions about your: Alcohol use. Tobacco use. Drug use. Emotional well-being. Home and relationship well-being. Sexual activity. Eating habits. History of falls. Memory and ability to understand (cognition). Work and work Statistician. Reproductive health. Screening  You may have the following tests or measurements: Height, weight, and BMI. Blood pressure. Lipid and cholesterol levels. These may be checked every 5 years, or more frequently if you are over 45 years old. Skin check. Lung cancer screening. You may have this screening every year starting at age 37 if you have a 30-pack-year history of smoking and currently smoke or have quit within the past 15 years. Fecal occult blood test (FOBT) of the stool. You may have this test every year starting at age 21. Flexible sigmoidoscopy or colonoscopy. You may have a sigmoidoscopy every 5 years or a colonoscopy every 10 years starting at age 50. Hepatitis C blood test. Hepatitis B blood test. Sexually transmitted disease (STD) testing. Diabetes screening. This is done by checking your blood sugar (glucose) after you have not eaten for a while (fasting). You may have this done every 1-3 years. Bone density scan. This is done to screen for osteoporosis. You may have this done starting at age 90. Mammogram. This may be done every 1-2 years. Talk to your health care provider about how  often you should have regular mammograms. Talk with your health care provider about your test results, treatment options, and if necessary, the need for more tests. Vaccines  Your health care provider may recommend certain vaccines, such as: Influenza vaccine. This is recommended every year. Tetanus, diphtheria, and acellular pertussis (Tdap, Td) vaccine. You may need a Td booster every 10 years. Zoster vaccine. You  may need this after age 53. Pneumococcal 13-valent conjugate (PCV13) vaccine. One dose is recommended after age 47. Pneumococcal polysaccharide (PPSV23) vaccine. One dose is recommended after age 13. Talk to your health care provider about which screenings and vaccines you need and how often you need them. This information is not intended to replace advice given to you by your health care provider. Make sure you discuss any questions you have with your health care provider. Document Released: 02/07/2015 Document Revised: 10/01/2015 Document Reviewed: 11/12/2014 Elsevier Interactive Patient Education  2017 [place] Prevention in the Home Falls can cause injuries. They can happen to people of all ages. There are many things you can do to make your home safe and to help prevent falls. What can I do on the outside of my home? Regularly fix the edges of walkways and driveways and fix any cracks. Remove anything that might make you trip as you walk through a door, such as a raised step or threshold. Trim any bushes or trees on the path to your home. Use bright outdoor lighting. Clear any walking paths of anything that might make someone trip, such as rocks or tools. Regularly check to see if handrails are loose or broken. Make sure that both sides of any steps have handrails. Any raised decks and porches should have guardrails on the edges. Have any leaves, snow, or ice cleared regularly. Use sand or salt on walking paths during winter. Clean up any spills in your garage right away. This includes oil or grease spills. What can I do in the bathroom? Use night lights. Install grab bars by the toilet and in the tub and shower. Do not use towel bars as grab bars. Use non-skid mats or decals in the tub or shower. If you need to sit down in the shower, use a plastic, non-slip stool. Keep the floor dry. Clean up any water that spills on the floor as soon as it happens. Remove soap buildup  in the tub or shower regularly. Attach bath mats securely with double-sided non-slip rug tape. Do not have throw rugs and other things on the floor that can make you trip. What can I do in the bedroom? Use night lights. Make sure that you have a light by your bed that is easy to reach. Do not use any sheets or blankets that are too big for your bed. They should not hang down onto the floor. Have a firm chair that has side arms. You can use this for support while you get dressed. Do not have throw rugs and other things on the floor that can make you trip. What can I do in the kitchen? Clean up any spills right away. Avoid walking on wet floors. Keep items that you use a lot in easy-to-reach places. If you need to reach something above you, use a strong step stool that has a grab bar. Keep electrical cords out of the way. Do not use floor polish or wax that makes floors slippery. If you must use wax, use non-skid floor wax. Do not have throw rugs and other things  on the floor that can make you trip. What can I do with my stairs? Do not leave any items on the stairs. Make sure that there are handrails on both sides of the stairs and use them. Fix handrails that are broken or loose. Make sure that handrails are as long as the stairways. Check any carpeting to make sure that it is firmly attached to the stairs. Fix any carpet that is loose or worn. Avoid having throw rugs at the top or bottom of the stairs. If you do have throw rugs, attach them to the floor with carpet tape. Make sure that you have a light switch at the top of the stairs and the bottom of the stairs. If you do not have them, ask someone to add them for you. What else can I do to help prevent falls? Wear shoes that: Do not have high heels. Have rubber bottoms. Are comfortable and fit you well. Are closed at the toe. Do not wear sandals. If you use a stepladder: Make sure that it is fully opened. Do not climb a closed  stepladder. Make sure that both sides of the stepladder are locked into place. Ask someone to hold it for you, if possible. Clearly mark and make sure that you can see: Any grab bars or handrails. First and last steps. Where the edge of each step is. Use tools that help you move around (mobility aids) if they are needed. These include: Canes. Walkers. Scooters. Crutches. Turn on the lights when you go into a dark area. Replace any light bulbs as soon as they burn out. Set up your furniture so you have a clear path. Avoid moving your furniture around. If any of your floors are uneven, fix them. If there are any pets around you, be aware of where they are. Review your medicines with your doctor. Some medicines can make you feel dizzy. This can increase your chance of falling. Ask your doctor what other things that you can do to help prevent falls. This information is not intended to replace advice given to you by your health care provider. Make sure you discuss any questions you have with your health care provider. Document Released: 11/07/2008 Document Revised: 06/19/2015 Document Reviewed: 02/15/2014 Elsevier Interactive Patient Education  2017 [employer].

## 2021-11-17 DIAGNOSIS — K5900 Constipation, unspecified: Secondary | ICD-10-CM | POA: Diagnosis not present

## 2021-11-17 DIAGNOSIS — F330 Major depressive disorder, recurrent, mild: Secondary | ICD-10-CM | POA: Diagnosis not present

## 2021-11-17 DIAGNOSIS — G4752 REM sleep behavior disorder: Secondary | ICD-10-CM | POA: Diagnosis not present

## 2021-11-17 DIAGNOSIS — K117 Disturbances of salivary secretion: Secondary | ICD-10-CM | POA: Diagnosis not present

## 2021-11-17 DIAGNOSIS — Z9181 History of falling: Secondary | ICD-10-CM | POA: Diagnosis not present

## 2021-11-17 DIAGNOSIS — G20A1 Parkinson's disease without dyskinesia, without mention of fluctuations: Secondary | ICD-10-CM | POA: Diagnosis not present

## 2021-11-18 DIAGNOSIS — Z23 Encounter for immunization: Secondary | ICD-10-CM | POA: Diagnosis not present

## 2021-11-23 DIAGNOSIS — G20A1 Parkinson's disease without dyskinesia, without mention of fluctuations: Secondary | ICD-10-CM | POA: Diagnosis not present

## 2021-11-23 DIAGNOSIS — F330 Major depressive disorder, recurrent, mild: Secondary | ICD-10-CM | POA: Diagnosis not present

## 2021-11-23 DIAGNOSIS — K117 Disturbances of salivary secretion: Secondary | ICD-10-CM | POA: Diagnosis not present

## 2021-11-23 DIAGNOSIS — Z9181 History of falling: Secondary | ICD-10-CM | POA: Diagnosis not present

## 2021-11-23 DIAGNOSIS — G4752 REM sleep behavior disorder: Secondary | ICD-10-CM | POA: Diagnosis not present

## 2021-11-23 DIAGNOSIS — K5900 Constipation, unspecified: Secondary | ICD-10-CM | POA: Diagnosis not present

## 2021-12-01 DIAGNOSIS — F330 Major depressive disorder, recurrent, mild: Secondary | ICD-10-CM | POA: Diagnosis not present

## 2021-12-01 DIAGNOSIS — K5900 Constipation, unspecified: Secondary | ICD-10-CM | POA: Diagnosis not present

## 2021-12-01 DIAGNOSIS — K117 Disturbances of salivary secretion: Secondary | ICD-10-CM | POA: Diagnosis not present

## 2021-12-01 DIAGNOSIS — G20A1 Parkinson's disease without dyskinesia, without mention of fluctuations: Secondary | ICD-10-CM | POA: Diagnosis not present

## 2021-12-01 DIAGNOSIS — Z9181 History of falling: Secondary | ICD-10-CM | POA: Diagnosis not present

## 2021-12-01 DIAGNOSIS — G4752 REM sleep behavior disorder: Secondary | ICD-10-CM | POA: Diagnosis not present

## 2021-12-03 DIAGNOSIS — K117 Disturbances of salivary secretion: Secondary | ICD-10-CM | POA: Diagnosis not present

## 2021-12-03 DIAGNOSIS — Z9181 History of falling: Secondary | ICD-10-CM | POA: Diagnosis not present

## 2021-12-03 DIAGNOSIS — F330 Major depressive disorder, recurrent, mild: Secondary | ICD-10-CM | POA: Diagnosis not present

## 2021-12-03 DIAGNOSIS — G4752 REM sleep behavior disorder: Secondary | ICD-10-CM | POA: Diagnosis not present

## 2021-12-03 DIAGNOSIS — G20A1 Parkinson's disease without dyskinesia, without mention of fluctuations: Secondary | ICD-10-CM | POA: Diagnosis not present

## 2021-12-03 DIAGNOSIS — K5900 Constipation, unspecified: Secondary | ICD-10-CM | POA: Diagnosis not present

## 2021-12-08 ENCOUNTER — Telehealth: Payer: Self-pay | Admitting: Neurology

## 2021-12-08 DIAGNOSIS — F330 Major depressive disorder, recurrent, mild: Secondary | ICD-10-CM | POA: Diagnosis not present

## 2021-12-08 DIAGNOSIS — K117 Disturbances of salivary secretion: Secondary | ICD-10-CM | POA: Diagnosis not present

## 2021-12-08 DIAGNOSIS — G4752 REM sleep behavior disorder: Secondary | ICD-10-CM | POA: Diagnosis not present

## 2021-12-08 DIAGNOSIS — K5900 Constipation, unspecified: Secondary | ICD-10-CM | POA: Diagnosis not present

## 2021-12-08 DIAGNOSIS — Z9181 History of falling: Secondary | ICD-10-CM | POA: Diagnosis not present

## 2021-12-08 DIAGNOSIS — G20A1 Parkinson's disease without dyskinesia, without mention of fluctuations: Secondary | ICD-10-CM | POA: Diagnosis not present

## 2021-12-08 NOTE — Telephone Encounter (Signed)
Called [place] and left a message for a call back.

## 2021-12-08 NOTE — Telephone Encounter (Signed)
PT from [facility] called in wanting to get a verbal order to discharge her early for next week. She has met all of her goals and has stopped falling. Ok to leave a message.

## 2021-12-09 NOTE — Telephone Encounter (Signed)
Called [place] and left a message for a call back.

## 2021-12-14 DIAGNOSIS — Z9181 History of falling: Secondary | ICD-10-CM | POA: Diagnosis not present

## 2021-12-14 DIAGNOSIS — G4752 REM sleep behavior disorder: Secondary | ICD-10-CM | POA: Diagnosis not present

## 2021-12-14 DIAGNOSIS — K117 Disturbances of salivary secretion: Secondary | ICD-10-CM | POA: Diagnosis not present

## 2021-12-14 DIAGNOSIS — F330 Major depressive disorder, recurrent, mild: Secondary | ICD-10-CM | POA: Diagnosis not present

## 2021-12-14 DIAGNOSIS — G20A1 Parkinson's disease without dyskinesia, without mention of fluctuations: Secondary | ICD-10-CM | POA: Diagnosis not present

## 2021-12-14 DIAGNOSIS — K5900 Constipation, unspecified: Secondary | ICD-10-CM | POA: Diagnosis not present

## 2021-12-22 ENCOUNTER — Encounter (HOSPITAL_COMMUNITY): Payer: Self-pay

## 2021-12-22 ENCOUNTER — Ambulatory Visit (HOSPITAL_BASED_OUTPATIENT_CLINIC_OR_DEPARTMENT_OTHER)
Admit: 2021-12-22 | Discharge: 2021-12-22 | Disposition: A | Discharge status: Home / Self Care | Payer: Medicare Other | Attending: Family Medicine | Admitting: Family Medicine

## 2021-12-22 ENCOUNTER — Telehealth: Payer: Self-pay

## 2021-12-22 ENCOUNTER — Emergency Department (HOSPITAL_COMMUNITY)
Admission: EM | Admit: 2021-12-22 | Discharge: 2021-12-23 | Disposition: A | Discharge status: Home / Self Care | Payer: Medicare Other | Attending: Emergency Medicine | Admitting: Emergency Medicine

## 2021-12-22 ENCOUNTER — Other Ambulatory Visit: Payer: Self-pay

## 2021-12-22 ENCOUNTER — Emergency Department (HOSPITAL_COMMUNITY): Payer: Medicare Other

## 2021-12-22 DIAGNOSIS — G20A1 Parkinson's disease without dyskinesia, without mention of fluctuations: Secondary | ICD-10-CM | POA: Diagnosis not present

## 2021-12-22 DIAGNOSIS — M79604 Pain in right leg: Secondary | ICD-10-CM | POA: Diagnosis not present

## 2021-12-22 DIAGNOSIS — E876 Hypokalemia: Secondary | ICD-10-CM | POA: Insufficient documentation

## 2021-12-22 DIAGNOSIS — R0602 Shortness of breath: Secondary | ICD-10-CM | POA: Insufficient documentation

## 2021-12-22 DIAGNOSIS — R52 Pain, unspecified: Secondary | ICD-10-CM | POA: Diagnosis not present

## 2021-12-22 LAB — CBC WITH DIFFERENTIAL/PLATELET
Abs Immature Granulocytes: 0.02 10*3/uL (ref 0.00–0.07)
Basophils Absolute: 0.1 10*3/uL (ref 0.0–0.1)
Basophils Relative: 1 %
Eosinophils Absolute: 0.3 10*3/uL (ref 0.0–0.5)
Eosinophils Relative: 4 %
HCT: 38.6 % (ref 36.0–46.0)
Hemoglobin: 12.3 g/dL (ref 12.0–15.0)
Immature Granulocytes: 0 %
Lymphocytes Relative: 35 %
Lymphs Abs: 2.3 10*3/uL (ref 0.7–4.0)
MCH: 32.9 pg (ref 26.0–34.0)
MCHC: 31.9 g/dL (ref 30.0–36.0)
MCV: 103.2 fL — ABNORMAL HIGH (ref 80.0–100.0)
Monocytes Absolute: 0.4 10*3/uL (ref 0.1–1.0)
Monocytes Relative: 5 %
Neutro Abs: 3.6 10*3/uL (ref 1.7–7.7)
Neutrophils Relative %: 55 %
Platelets: 205 10*3/uL (ref 150–400)
RBC: 3.74 MIL/uL — ABNORMAL LOW (ref 3.87–5.11)
RDW: 13.9 % (ref 11.5–15.5)
WBC: 6.6 10*3/uL (ref 4.0–10.5)
nRBC: 0 % (ref 0.0–0.2)

## 2021-12-22 LAB — BASIC METABOLIC PANEL
Anion gap: 10 (ref 5–15)
BUN: 9 mg/dL (ref 8–23)
CO2: 26 mmol/L (ref 22–32)
Calcium: 8.5 mg/dL — ABNORMAL LOW (ref 8.9–10.3)
Chloride: 107 mmol/L (ref 98–111)
Creatinine, Ser: 0.53 mg/dL (ref 0.44–1.00)
GFR, Estimated: >60 mL/min (ref >60)
Glucose, Bld: 87 mg/dL (ref 70–99)
Potassium: 3.4 mmol/L — ABNORMAL LOW (ref 3.5–5.1)
Sodium: 143 mmol/L (ref 135–145)

## 2021-12-22 LAB — BRAIN NATRIURETIC PEPTIDE: B Natriuretic Peptide: 82.2 pg/mL (ref 0.0–100.0)

## 2021-12-22 NOTE — Telephone Encounter (Signed)
Noted. Will await ER report.  Thanks.  °

## 2021-12-22 NOTE — Progress Notes (Signed)
Bilateral lower extremity venous duplex has been completed. Preliminary results can be found in CV Proc through chart review.  Results were given to Dallie Piles PA.  12/22/21 4:10 PM [name] RVT

## 2021-12-22 NOTE — ED Triage Notes (Signed)
Pt c/o "heat" to R leg running from knee to foot x 2-3 weeks; denies pain, redness, swelling; also endorses intermittent sob that started to day; pt speaking in full sentances, NAD at this time

## 2021-12-22 NOTE — Telephone Encounter (Signed)
Per chart review tab pt is at [facility] ED. Sending note to Dr [name] and [name] pool.  [place] RECORD AccessNurse Patient Name: [name] Gender: Female DOB: 1940/05/14 Age: 81 Y 2 M 28 D Return Phone Number: [phone] (Secondary) Address: [name]  [address] Client [name] Day - Client Client Site [place] - Day Provider Renford Dills - MD Contact Type Call Who Is Calling Patient / Member / Family / Caregiver Call Type Triage / Clinical Relationship To Patient Self Return Phone Number [phone] (Secondary) Chief Complaint Leg Swelling And Edema Reason for Call Symptomatic / Request for [name] states her right leg is feeling hot, it doesn't hurt. Caller states her legs do swell a couple times during the day. Translation No Nurse Assessment Nurse: [place], RN, Estill Bamberg Date/Time (Eastern Time): 12/22/2021 10:05:06 AM Confirm and document reason for call. If symptomatic, describe symptoms. ---caller states her right leg gets "runs of warmth" legs stay swollen. not arm to touch. not red. Does the patient have any new or worsening symptoms? ---Yes Will a triage be completed? ---Yes Related visit to physician within the last 2 weeks? ---No Does the PT have any chronic conditions? (i.e. diabetes, asthma, this includes High risk factors for pregnancy, etc.) ---Yes List chronic conditions. ---parkinson's Is this a behavioral health or substance abuse call? ---No Guidelines Guideline Title Affirmed Question Affirmed Notes Nurse Date/Time Eilene Ghazi Time) Leg Swelling and Edema Difficulty breathing at rest Humfleet, RN, Estill Bamberg 12/22/2021 10:07:02 AM Disp. Time Eilene Ghazi Time) Disposition Final User 12/22/2021 10:08:47 AM Go to ED Now Yes Humfleet, RN, Estill Bamberg Final Disposition 12/22/2021 10:08:47  AM Go to ED Now Yes Humfleet, RN, Estill Bamberg PLEASE NOTE: All timestamps contained within this report are represented as [place] Standard Time. CONFIDENTIALTY NOTICE: This fax transmission is intended only for the addressee. It contains information that is legally privileged, confidential or otherwise protected from use or disclosure. If you are not the intended recipient, you are strictly prohibited from reviewing, disclosing, copying using or disseminating any of this information or taking any action in reliance on or regarding this information. If you have received this fax in error, please notify us immediately by telephone so that we can arrange for its return to [place]. Phone: [phone], Toll-Free: [phone], Fax: [phone] Page: 2 of 2 Call Id: [id number] [place] Disagree/Comply Comply Caller Understands Yes PreDisposition InappropriateToAsk Care Advice Given Per Guideline GO TO ED NOW: * You need to be seen in the Emergency Department. * Go to the ED at ___________ Hospital. * Patient should not delay going to the emergency department. * If immediate transportation is not available via car, rideshare (e.g., Lyft, Uber), or taxi, then the patient should be instructed to call [id number]. CARE ADVICE given per Leg Swelling and Edema (Adult) guideline. Referrals Erma - E

## 2021-12-22 NOTE — ED Provider Triage Note (Signed)
Emergency Medicine Provider Triage Evaluation Note  [name] , a 81 y.o. female  was evaluated in triage.  Pt complains of right lower leg erythema, warmth, swelling, and pain. Called PCP today to make them aware and "sounded short of breath on the phone" so was sent here. Recent decreased mobility but no recent travel, history of DVT/PE, or hormone use. No chest pain, nausea, vomiting, dizziness, headache, or abdominal pain.   Review of Systems  Positive: See HPI Negative: See HPI  Physical Exam  BP (!) 181/153   Pulse 95   Temp 98.6 F (37 C)   Resp 16   SpO2 100%  Gen:   Awake, no distress  mild tremor secondary to Parkinson's Resp:  Normal effort LCTA MSK:   Moves extremities without difficulty  Other:  Mild tachycardia, Very minor erythema and swelling to right lower leg compared to left, no calf, tib-fib, or knee tenderness to either LE, no open wounds or warmth  Medical Decision Making  Medically screening exam initiated at 1:53 PM.  Appropriate orders placed.  [name] was informed that the remainder of the evaluation will be completed by another provider, this initial triage assessment does not replace that evaluation, and the importance of remaining in the ED until their evaluation is complete.  Pt here for RLE skin changes for 2 weeks and dyspnea intermittent today present with exertion and at rest. Vital signs are stable though BP is very elevated. O2 saturation is 100% but with mild tachycardia and risk factors will proceed with labs and [place] LE to rule out DVT.    Megan Righter, PA-C 12/22/21 1409

## 2021-12-23 NOTE — Discharge Instructions (Signed)
You blood work looked good today.  There was no evidence of blood clot or infection in the leg.  No evidence of pneumonia or fluid on your lungs.  Please follow-up with your doctor.

## 2021-12-23 NOTE — ED Provider Notes (Signed)
[facility] Emergency Department Provider Note MRN:  [id number]  Arrival date & time: 12/23/21     Chief Complaint   Shortness of Breath and Leg Problem   History of Present Illness   [name] is a 81 y.o. year-old female presents to the ED with chief complaint of heat or warmth in right knee for the past 2 to 3 weeks.  She denies any injuries.  She does not note any swelling, but states that she does have trouble getting around due to her Parkinson's disease.  She also states that sometimes she has some shortness of breath.  She denies any shortness of breath or chest pain now.  She denies cough or fever.  She states that she only feels tired now, denies any other complaints..  History provided by patient.   Review of Systems  Pertinent positive and negative review of systems noted in HPI.    Physical Exam   Vitals:   12/22/21 2247 12/23/21 0100  BP: 138/73 (!) 149/80  Pulse: 88 83  Resp: 18 17  Temp: 98.3 F (36.8 C)   SpO2: 98% 100%    CONSTITUTIONAL:  non toxic-appearing, NAD NEURO:  Alert and oriented x 3, CN 3-12 grossly intact EYES:  eyes equal and reactive ENT/NECK:  Supple, no stridor  CARDIO:  normal rate, regular rhythm, appears well-perfused  PULM:  No respiratory distress, CTAB GI/GU:  non-distended, non tender MSK/SPINE:  No gross deformities, no edema, moves all extremities, normal PROM, no erythema, no calf tenderness SKIN:  no rash, atraumatic   *Additional and/or pertinent findings included in MDM below  Diagnostic and Interventional Summary    EKG Interpretation  Date/Time:  Tuesday December 22 2021 13:49:40 EST Ventricular Rate:  93 PR Interval:  126 QRS Duration: 78 QT Interval:  370 QTC Calculation: 460 R Axis:   71 Text Interpretation: Normal sinus rhythm Nonspecific ST abnormality Abnormal ECG When compared with ECG of 03-Mar-2010 08:50, PREVIOUS ECG IS PRESENT Confirmed by Thayer Jew [phone]) on  12/22/2021 11:45:56 PM       Labs Reviewed  BASIC METABOLIC PANEL - Abnormal; Notable for the following components:      Result Value   Potassium 3.4 (*)    Calcium 8.5 (*)    All other components within normal limits  CBC WITH DIFFERENTIAL/PLATELET - Abnormal; Notable for the following components:   RBC 3.74 (*)    MCV 103.2 (*)    All other components within normal limits  BRAIN NATRIURETIC PEPTIDE    VAS [place] LOWER EXTREMITY VENOUS (DVT) (ONLY [facility] & [facility])  Final Result    DG Chest 2 View  Final Result      Medications - No data to display   Procedures  /  Critical Care Procedures  ED Course and Medical Decision Making  I have reviewed the triage vital signs, the nursing notes, and pertinent available records from the EMR.  Social Determinants Affecting Complexity of Care: Patient has no clinically significant social determinants affecting this chief complaint..   ED Course:    Medical Decision Making Patient here with reported warmth or heat in her right leg.  She does not have any rash or erythema.  Nothing to suggest infection.  A lower extremity Doppler ultrasound was ordered in triage and is negative for lower extremity DVT.  Negative for Baker's cyst.  Chest x-ray negative for pneumonia or effusion.  There are some old healing rib fractures.  Labs are reassuring.  No leukocytosis.  BNP is normal.  She denies any chest pain.  I doubt ACS.  The symptoms do not sound new, and I do not see any acute findings during the emergency department workup today.  I feel the patient can be safely discharged with outpatient follow-up.  She is agreeable with this plan.  Amount and/or Complexity of Data Reviewed Labs: ordered.    Details: Mild hypokalemia at 3.4, no leukocytosis, BNP is 82.2 Radiology: ordered and independent interpretation performed.    Details: No effusion, no pneumonia     Consultants: No consultations were needed in caring for this patient.   Treatment  and Plan: I considered admission due to patient's initial presentation, but after considering the examination and diagnostic results, patient will not require admission and can be discharged with outpatient follow-up.    Final Clinical Impressions(s) / ED Diagnoses     ICD-10-CM   1. Right leg pain  M79.604     2. SOB (shortness of breath)  R06.02       ED Discharge Orders     None         Discharge Instructions Discussed with and Provided to Patient:   Discharge Instructions      You blood work looked good today.  There was no evidence of blood clot or infection in the leg.  No evidence of pneumonia or fluid on your lungs.  Please follow-up with your doctor.      Montine Circle, PA-C 12/23/21 0211    Merryl Hacker, MD 12/24/21 [phone]

## 2021-12-25 ENCOUNTER — Telehealth: Payer: Self-pay

## 2021-12-25 NOTE — Telephone Encounter (Signed)
        Patient  visited [facility] on 11/29   Telephone encounter attempt :  1st  A HIPAA compliant voice message was left requesting a return call.  Instructed patient to call back    [place], [facility]  [phone] [address], [place], [place] [address] Phone: [phone] Email: [email or web address]

## 2021-12-28 ENCOUNTER — Telehealth: Payer: Self-pay

## 2021-12-28 NOTE — Telephone Encounter (Signed)
     Patient  visit on 11/29  at [facility]  Have you been able to follow up with your primary care physician? Yes   The patient was or was not able to obtain any needed medicine or equipment. Yes   Are there diet recommendations that you are having difficulty following? Na   Patient expresses understanding of discharge instructions and education provided has no other needs at this time.  Yes      [place], [facility] Care Management  [phone] [address], [place], [place] [address] Phone: [phone] Email: [email or web address]

## 2021-12-31 ENCOUNTER — Ambulatory Visit (INDEPENDENT_AMBULATORY_CARE_PROVIDER_SITE_OTHER): Payer: Medicare Other | Admitting: Family Medicine

## 2021-12-31 ENCOUNTER — Encounter: Payer: Self-pay | Admitting: Family Medicine

## 2021-12-31 VITALS — BP 130/70 | HR 95 | Temp 97.9°F | Wt 110.0 lb

## 2021-12-31 DIAGNOSIS — M25561 Pain in right knee: Secondary | ICD-10-CM | POA: Diagnosis not present

## 2021-12-31 MED ORDER — PREDNISONE 10 MG PO TABS: ORAL_TABLET | ORAL | 0 refills | Status: DC

## 2021-12-31 MED ORDER — VENLAFAXINE HCL ER 37.5 MG PO CP24: 112.5000 mg | ORAL_CAPSULE | Freq: Every day | ORAL | 12 refills | Status: DC

## 2021-12-31 NOTE — Patient Instructions (Addendum)
Try prednisone with food and let me know if that helps with your knee and your breathing.   Take care.  Glad to see you.

## 2021-12-31 NOTE — Progress Notes (Signed)
ER f/u.   Episodically SOB w/o wheeze. Going on for about 1 month.  Recent CXR with no active cardiopulmonary disease. Probable healing left seventh through ninth rib fractures.  There is no evidence of deep vein thrombosis in the lower extremity on recent u/s.  R leg feels warm to patient, distal to the R knee.    R knee pain, increased recently.  Pain on standing.    Meds, vitals, and allergies reviewed.   ROS: Per HPI unless specifically indicated in ROS section   GEN: nad, alert and oriented HEENT: ncat NECK: supple w/o LA CV: rrr.  PULM: ctab, no inc wob ABD: soft, +bs EXT: no edema SKIN: well perfused.  Parkinsonian tremor at baseline. R calf 34cm, L calf 33cm.   R knee lateral joint line ttp.

## 2022-01-03 NOTE — Assessment & Plan Note (Signed)
It could be that she is having arthritic changes in the knee with subsequent distal edema.  She has joint line tenderness in the right knee, especially laterally.  Discussed options.  Reasonable to try prednisone with steroid cautions discussed with patient.  She will see if this affects her knee pain at all, and also if it affects her breathing at all.  She has had some episodic shortness of breath without wheeze with recent clear chest x-ray.  Her lungs are clear today.  We talked about her possible rib fractures and how this could contribute.

## 2022-02-05 ENCOUNTER — Encounter: Payer: Self-pay | Admitting: Internal Medicine

## 2022-02-12 ENCOUNTER — Encounter: Payer: Self-pay | Admitting: Internal Medicine

## 2022-02-12 ENCOUNTER — Ambulatory Visit (INDEPENDENT_AMBULATORY_CARE_PROVIDER_SITE_OTHER): Payer: Medicare Other | Admitting: Neurology

## 2022-02-12 DIAGNOSIS — K117 Disturbances of salivary secretion: Secondary | ICD-10-CM | POA: Diagnosis not present

## 2022-02-12 MED ORDER — INCOBOTULINUMTOXINA 100 UNITS IM SOLR
100.0000 [IU] | INTRAMUSCULAR | Status: AC
  Administered 2022-02-12: 100 [IU] via INTRAMUSCULAR

## 2022-02-12 NOTE — Procedures (Signed)
Botulinum Clinic   History:  Diagnosis: Sialorrhea associated with PD (icd10: K11.7) Results:  doing well   Informed consent was obtained.  The patient was educated on the botulinum toxin the black blox warning and given a copy of the botox patient medication guide.  The patient understands that this warning states that there have been reported cases of the Botox extending beyond the injection site and creating adverse effects, similar to those of botulism. This included loss of strength, trouble walking, hoarseness, trouble saying words clearly, loss of bladder control, trouble breathing, trouble swallowing, diplopia, blurry vision and ptosis. Most of the distant spread of Botox was happening in patients, primarily children, who received medication for spasticity or for cervical dystonia. The patient expressed understanding and desire to proceed.   Injections  Location Left  Right Units Number of sites  Parotid (point 1 on picture) 30 30 60 1 per side  Submandibular (point 2) 20 20 40 1 per side  TOTAL UNITS:   100    Type of Toxin: Xeomin Discarded Units: 0  Needle drawback with each injection was free of blood. Pt tolerated procedure well without complications.   Reinjection is anticipated in 4 months.

## 2022-03-24 ENCOUNTER — Other Ambulatory Visit: Payer: Self-pay | Admitting: Family Medicine

## 2022-03-24 ENCOUNTER — Other Ambulatory Visit: Payer: Self-pay | Admitting: Internal Medicine

## 2022-03-24 DIAGNOSIS — C50919 Malignant neoplasm of unspecified site of unspecified female breast: Secondary | ICD-10-CM

## 2022-04-16 ENCOUNTER — Other Ambulatory Visit: Payer: Self-pay | Admitting: Family Medicine

## 2022-04-20 NOTE — Progress Notes (Unsigned)
Assessment/Plan:    1.  Parkinsons Disease  -we discussed changing to the CR so she doesn't have to take quite so often but she decided to hold for now.  Continue carbidopa/levodopa 25/100, 1 tablet at 7 AM/ 1/2 tablet at 9, 1/2 tablet at 11 AM/1/2 tablet at 1pm/1/2 tablet at 3 PM/1/2 tablet at 5pm/ 1/2 tablet at 7 PM  -continue amantadine 100 mg at 7am and 1 pm for significant dyskinesia.    2.  Sialorrhea  -On Xeomin and it is working well.  Last injections February 12, 2022  3.  RBD  -had one fall out of the bed since our last visit.  Told her to get bed rails from [place].  Using clonazepam, 0.5 mg, half tablet at bed.    4.  Constipation with what sounds like overflow incontinence  -saw GI NP - Treatment with benefiber recommended and helping  5.  Depression  -On venlafaxine.  Subjective:   [name] was seen today in follow up for Parkinsons disease.  My previous records were reviewed prior to todays visit as well as outside records available to me.   Patient continues on Xeomin for sialorrhea.  That seems to be working well.  We cautiously added amantadine last visit for dyskinesia and she reports that it helped without SE.  She had hallucinations in past but not this time.  She is struggling with the frequency of taking levodopa but she does get them in.  She had one fall few weeks ago in the bathroom.  She stepped backwards in the bathroom and fell.  She hit her ribs.  She was able to get up on her own.  She was in the emergency room at the end of November for right leg/knee pain.  Workup there was negative.  Its still bothering her.  She followed up with primary care physician in December.  She was given a course of prednisone. Current prescribed movement disorder medications: carbidopa/levodopa 25/100, 1 tablet at 7 AM/ 1/2 tablet at 9, 1/2 tablet at 11 AM/1/2 tablet at 1pm/1/2 tablet at 3 PM/1/2 tablet at 5pm/ 1/2 tablet at 7 PM  Amantadine, 100 mg at 7 AM/1 PM (added  last visit) Clonazepam 0.5 mg, half tablet at bed   PREVIOUS MEDICATIONS: has clonazepam but rarely takes  ALLERGIES:   Allergies  Allergen Reactions   Penicillins Shortness Of Breath and Itching    CURRENT MEDICATIONS:  Outpatient Encounter Medications as of 04/22/2022  Medication Sig   amantadine (SYMMETREL) 100 MG capsule Take 1 capsule (100 mg total) by mouth 2 (two) times daily. 1 at 7am and 1 at 1pm   b complex vitamins tablet Take 1 tablet by mouth daily.   Calcium 1500 MG tablet Take 1,500 mg by mouth.   carbidopa-levodopa (SINEMET IR) 25-100 MG tablet 1 at 7 AM/ 1/2 at 9, 1/2 at 11 AM/1/2 at 1pm/1/2 at 3 PM/1/2 at 5pm/ 1/2 at 7 PM   Cholecalciferol 5000 units TABS Take 1 tablet (5,000 Units total) by mouth daily.   clonazePAM (KLONOPIN) 0.5 MG tablet TAKE 1/2 (ONE-HALF) TABLET BY MOUTH AT BEDTIME   Denosumab (PROLIA [place]) Inject into the skin every 6 (six) months.   Elastic Bandages & Supports (ABDOMINAL BINDER/ELASTIC SMALL) MISC 1 Device by Does not apply route daily.   Multiple Vitamin (MULTIVITAMIN) capsule 2 tabs po qd   Omega-3 Fatty Acids (OMEGA 3 PO) 1 tab po qd   omeprazole (PRILOSEC) 20 MG capsule TAKE (1)  CAPSULE BY MOUTH ONCE DAILY.   OnabotulinumtoxinA (BOTOX IM) Inject into the muscle every 3 (three) months. Injection every 3 months   tamoxifen (NOLVADEX) 20 MG tablet TAKE 1 TABLET BY MOUTH ONCE DAILY.   venlafaxine XR (EFFEXOR-XR) 37.5 MG 24 hr capsule Take 3 capsules (112.5 mg total) by mouth daily with breakfast.   vitamin B-12 (CYANOCOBALAMIN) 500 MCG tablet Take 2500 mcg daily.   methocarbamol (ROBAXIN) 500 MG tablet TAKE 1 TABLET EVERY 8 HOURS AS NEEDED FOR MUSCLE SPASM (Patient not taking: Reported on 04/22/2022)   [DISCONTINUED] predniSONE (DELTASONE) 10 MG tablet Take 2 a day for 5 days, then 1 a day for 5 days, with food. Don't take with aleve/ibuprofen. (Patient not taking: Reported on 04/22/2022)   Facility-Administered Encounter Medications as of  04/22/2022  Medication   incobotulinumtoxinA (XEOMIN) 100 units injection 100 Units    Objective:   PHYSICAL EXAMINATION:    VITALS:   Vitals:   04/22/22 0754  BP: 118/70  Pulse: 80  SpO2: 99%  Weight: 109 lb 12.8 oz (49.8 kg)  Height: 5\' 3"  (1.6 m)   GEN:  The patient appears stated age and is in NAD. HEENT:  Normocephalic, atraumatic.  The mucous membranes are moist. The superficial temporal arteries are without ropiness or tenderness.  Neurological examination:  Orientation: The patient is alert and oriented x3. Cranial nerves: There is good facial symmetry with facial hypomimia. The speech is fluent and hypophonic. Soft palate rises symmetrically and there is no tongue deviation. Hearing is intact to conversational tone. Sensation: Sensation is intact to light touch throughout Motor: Strength is at least antigravity x4.  Movement examination: Tone: There is nl tone in the UE/LE Abnormal movements: she has mod LUE dyskinesia but none elsewhere which is improved Coordination:  There is mild to mod decremation with toe taps bilaterally.  Other RAMs are good Gait and Station: The patient pushes off to arise.  She ambulates well today with her pronged cane.    Total time spent on today's visit was 22 minutes, including both face-to-face time and nonface-to-face time.  Time included that spent on review of records (prior notes available to me/labs/imaging if pertinent), discussing treatment and goals, answering patient's questions and coordinating care.  Cc:  Tonia Ghent, MD

## 2022-04-22 ENCOUNTER — Encounter: Payer: Self-pay | Admitting: Neurology

## 2022-04-22 ENCOUNTER — Ambulatory Visit (INDEPENDENT_AMBULATORY_CARE_PROVIDER_SITE_OTHER): Payer: Medicare Other | Admitting: Neurology

## 2022-04-22 VITALS — BP 118/70 | HR 80 | Ht 63.0 in | Wt 109.8 lb

## 2022-04-22 DIAGNOSIS — G20B2 Parkinson's disease with dyskinesia, with fluctuations: Secondary | ICD-10-CM | POA: Diagnosis not present

## 2022-04-22 DIAGNOSIS — K117 Disturbances of salivary secretion: Secondary | ICD-10-CM | POA: Diagnosis not present

## 2022-04-22 NOTE — Patient Instructions (Signed)
Local and Online Resources for Power over Parkinson's Group  March 2024   LOCAL [place] PARKINSON'S GROUPS   Power over Parkinson's Group:    Power Over Parkinson's Patient Education Group will be Wednesday, March 13th-*Hybrid meting*- in person at [place] [facility] location and via WEBEX, 2:00-3:00 pm.   Starting in November 2023, Power over Parkinson's and Care Partner Groups will meet together, with plans for separate break out session for caregivers (*this will be evolving over the next few months) Upcoming Power over Parkinson's Meetings/Care Partner Support:  2nd Wednesdays of the month at 2 pm:  March 13th, April 10th Contact Amy Marriott at [email or web address] if interested in participating in this group    LOCAL EVENTS AND NEW OFFERINGS  Let's Try Pickleball-$25 for 6 weeks of Pickleball, starting February 2nd.  Contact Sarah Chambers for more details.  [email or web address] NEW:  Parkinson's Social Game Night.  First Thursday of each month, 2:00-4:00 pm.  *Next date is MARCH 7th*.  [facility], [place].  Contact [email or web address] if interested. Parkinson's CarePartner Group for Men is in the works, if interested email Sarah  [email or web address] [employer] Chair Yoga classes "Train and Gain", Fridays 10 am, [employer].  Contact Gina at [phone].  PWR! Moves Community Fitness Instructor-Led Classes offering at [employer]!  TUESDAYS and Wednesdays 1-2 pm.   Contact Christy Weaver at  [email or web address]  or [phone] (Tuesday classes are modified for chair and standing only) Drumming for Parkinson's will be held on 2nd and 4th Mondays at 11:00 am.   Located at the [employer] ([address] [place].)  Contact Jane Maydian at [email or web address] or [phone]  Dance for Parkinson 's classes will be on Tuesdays 10-11 am starting in February. Located in the [facility]  [employer] [facility], in the first floor of the [facility] ([address] To register:  [email or web address] or [phone] [employer] Parkinson's Tai Chi Class, Mondays at 11 am.  Call [phone] for details Moving Day [place].  Saturday, May 4th, 10 am start.  Register at [email or web address]    ONLINE EDUCATION AND SUPPORT  Parkinson Foundation:  www.parkinson.org  PD Health at Home continues:  Mindfulness Mondays, Wellness Wednesdays, Fitness Fridays  (PWR! Moves as part of Fitness Fridays March 22nd, 1-1:45 pm) Upcoming Education:   Managing "Off" Periods:  Return of Parkinson's Symptoms.  Wednesday, March 20th, 1-2 pm Parkinson's 101.  Wednesday, April 3rd, 1-2 pm Expert Briefing:  Understanding Pain in Parkinson's.   Wednesday, March 13th, 1-2 pm  Research Update:  Working to Halt PD.  Wednesday, April 10th, 1-2 pm Register for virtual education and expert briefings (webinars) at www.[email or web address].org/resources-support/[email or web address]-education Please check out their website to sign up for emails and see their full online offerings     Michael [employer] Fox Foundation:  www.michaeljfox.org   Third Thursday Webinars:  On the third Thursday of every month at 12 p.m. ET, join our free live webinars to learn about various aspects of living with Parkinson's disease and our work to speed medical breakthroughs.  Upcoming Webinar:  Everyday Exposure to Parkinson's:  Environmental Connections to the Disease.  Thursday, March 21st at 12 noon. Check out additional information on their website to see their full online offerings    Davis Phinney Foundation:  www.davisphinneyfoundation.org  Upcoming Webinar:   Nutrition and Parkinson's.  Wednesday, March 6th, 12 noon Webinar Series:  Living with Parkinson's Meetup.   Third Thursdays each month, 3 pm  Care Partner Monthly   Meetup.  With Connie Carpenter Phinney.  First Tuesday of each month, 2 pm  Check out additional information  to Live Well Today on their website    Parkinson and Movement Disorders (PMD) Alliance:  www.pmdalliance.org  [employer]:  Online Education Events  Sign up for emails, which are sent weekly to give you updates on programming and online offerings    Parkinson's Association of [place]:  www.parkinsonassociation.org  Information on online support groups, education events, and online exercises including Yoga, Parkinson's exercises and more-LOTS of information on links to PD resources and online events  Virtual Support Group through Parkinson's Association of [place]; next one is scheduled for Wednesday, March 6th  MOVEMENT AND EXERCISE OPPORTUNITIES  PWR! Moves Classes at [facility].  Wednesdays 10 and 11 am.   Contact Amy Marriott, PT [email or web address] if interested.  PWR! Moves Class offerings at [employer]. *TUESDAYS* and Wednesdays 1-2 pm.    Contact Christy Weaver at  [email or web address]    Parkinson's Wellness Recovery (PWR! Moves)  www.pwr4life.org  Info on the PWR! Virtual Experience:  You will have access to our expertise?through self-assessment, guided plans that start with the PD-specific fundamentals, educational content, tips, Q&A with an expert, and a growing library of PD-specific pre-recorded and live exercise classes of varying types and intensity - both physical and cognitive! If that is not enough, we offer 1:1 wellness consultations (in-person or virtual) to personalize your PWR! Virtual Experience.   Parkinson Foundation Fitness Fridays:   As part of the PD Health @ Home program, this free video series focuses each week on one aspect of fitness designed to support people living with Parkinson's.? These weekly videos highlight the Parkinson Foundation fitness guidelines for people with Parkinson's disease.  www.parkinson.org/resources-support/online-education/pdhealth#ff  Dance for PD website is offering free, live-stream  classes throughout the week, as well as links to digital library of classes:  [email or web address].[email or web address]  Virtual dance and Pilates for Parkinson's classes: Click on the Community Tab> Parkinson's Movement Initiative Tab.  To register for classes and for more information, visit www.americandancefestival.org and click the "community" tab.   [employer] Parkinson's Cycling Classes   [employer]:  Thursdays @ Noon-Live classes at [employer] (Contact Margaret Hazen at [email or web address]?or [phone])  [employer]: Virtual Classes Mondays and Thursdays /Live classes Tuesday, Wednesday and Thursday (contact Marlee at [email or web address] ?or [phone])  [place] Steady Boxing  Varied levels of classes are offered Tuesdays and Thursdays at [employer] [facility] [facility].   Stretching with Maria weekly class is also offered for people with Parkinson's  To observe a class or for more information, call [phone] or email Hillary Savage at [email or web address]   ADDITIONAL SUPPORT AND RESOURCES  [employer]:Online Caregiver Education Opportunities:  www.well-springsolutions.org/caregiver-education/caregiver-support-group.  You may also contact Jodi Kolada at [email or web address] or [phone].     Family Caregiver [facility].  Thursday, March 7th, 10:15-1:45 at [facility].  Register with Jodi Kolada (see above) Well-Spring Navigator:  Just1Navigator program, a?free service to help individuals and families through the journey of determining care for older adults.  The "Navigator" is a social worker, Nicole Reynolds, who will speak with a prospective client and/or loved ones to provide an assessment of the situation and a set of recommendations for a personalized care plan -- all free of charge, and whether?[employer] offers the needed service or not. If the need is not a service we provide, we are well-connected with reputable programs in  town that we can refer you to.  www.well-springsolutions.org or   to speak with the Navigator, call [phone].     

## 2022-05-03 ENCOUNTER — Other Ambulatory Visit: Payer: Self-pay

## 2022-05-03 ENCOUNTER — Inpatient Hospital Stay: Payer: Medicare Other | Attending: Internal Medicine

## 2022-05-03 ENCOUNTER — Inpatient Hospital Stay: Payer: Medicare Other

## 2022-05-03 VITALS — BP 126/71 | HR 93 | Temp 98.2°F | Resp 16

## 2022-05-03 DIAGNOSIS — C50011 Malignant neoplasm of nipple and areola, right female breast: Secondary | ICD-10-CM

## 2022-05-03 DIAGNOSIS — Z79899 Other long term (current) drug therapy: Secondary | ICD-10-CM | POA: Insufficient documentation

## 2022-05-03 DIAGNOSIS — Z170 Estrogen receptor positive status [ER+]: Secondary | ICD-10-CM | POA: Insufficient documentation

## 2022-05-03 DIAGNOSIS — D472 Monoclonal gammopathy: Secondary | ICD-10-CM

## 2022-05-03 LAB — BASIC METABOLIC PANEL
Anion gap: 7 (ref 5–15)
BUN: 16 mg/dL (ref 8–23)
CO2: 27 mmol/L (ref 22–32)
Calcium: 8.9 mg/dL (ref 8.9–10.3)
Chloride: 107 mmol/L (ref 98–111)
Creatinine, Ser: 0.76 mg/dL (ref 0.44–1.00)
GFR, Estimated: >60 mL/min (ref >60)
Glucose, Bld: 108 mg/dL — ABNORMAL HIGH (ref 70–99)
Potassium: 3.8 mmol/L (ref 3.5–5.1)
Sodium: 141 mmol/L (ref 135–145)

## 2022-05-03 MED ORDER — DENOSUMAB 60 MG/ML ~~LOC~~ SOSY
60.0000 mg | PREFILLED_SYRINGE | Freq: Once | SUBCUTANEOUS | Status: AC
  Administered 2022-05-03: 60 mg via SUBCUTANEOUS
  Filled 2022-05-03: qty 1

## 2022-05-10 ENCOUNTER — Other Ambulatory Visit: Payer: Medicare Other

## 2022-05-17 ENCOUNTER — Ambulatory Visit: Payer: Medicare Other | Admitting: Internal Medicine

## 2022-05-26 DIAGNOSIS — Z1231 Encounter for screening mammogram for malignant neoplasm of breast: Secondary | ICD-10-CM | POA: Diagnosis not present

## 2022-05-26 LAB — HM MAMMOGRAPHY

## 2022-06-12 ENCOUNTER — Other Ambulatory Visit: Payer: Self-pay | Admitting: Neurology

## 2022-07-05 ENCOUNTER — Other Ambulatory Visit: Payer: Self-pay | Admitting: Neurology

## 2022-07-05 DIAGNOSIS — F330 Major depressive disorder, recurrent, mild: Secondary | ICD-10-CM

## 2022-07-07 DIAGNOSIS — H53031 Strabismic amblyopia, right eye: Secondary | ICD-10-CM | POA: Diagnosis not present

## 2022-10-25 ENCOUNTER — Other Ambulatory Visit: Payer: Self-pay

## 2022-10-25 ENCOUNTER — Inpatient Hospital Stay: Payer: Medicare Other | Attending: Internal Medicine

## 2022-10-25 ENCOUNTER — Inpatient Hospital Stay: Payer: Medicare Other

## 2022-10-25 VITALS — BP 145/84 | HR 85 | Temp 98.2°F | Resp 17

## 2022-10-25 DIAGNOSIS — Z853 Personal history of malignant neoplasm of breast: Secondary | ICD-10-CM | POA: Diagnosis not present

## 2022-10-25 DIAGNOSIS — D472 Monoclonal gammopathy: Secondary | ICD-10-CM | POA: Diagnosis not present

## 2022-10-25 DIAGNOSIS — C50011 Malignant neoplasm of nipple and areola, right female breast: Secondary | ICD-10-CM

## 2022-10-25 DIAGNOSIS — Z79899 Other long term (current) drug therapy: Secondary | ICD-10-CM | POA: Insufficient documentation

## 2022-10-25 LAB — CBC WITH DIFFERENTIAL (CANCER CENTER ONLY)
Abs Immature Granulocytes: 0.02 10*3/uL (ref 0.00–0.07)
Basophils Absolute: 0.1 10*3/uL (ref 0.0–0.1)
Basophils Relative: 1 %
Eosinophils Absolute: 0.3 10*3/uL (ref 0.0–0.5)
Eosinophils Relative: 4 %
HCT: 37.7 % (ref 36.0–46.0)
Hemoglobin: 12.1 g/dL (ref 12.0–15.0)
Immature Granulocytes: 0 %
Lymphocytes Relative: 35 %
Lymphs Abs: 2.3 10*3/uL (ref 0.7–4.0)
MCH: 32.9 pg (ref 26.0–34.0)
MCHC: 32.1 g/dL (ref 30.0–36.0)
MCV: 102.4 fL — ABNORMAL HIGH (ref 80.0–100.0)
Monocytes Absolute: 0.4 10*3/uL (ref 0.1–1.0)
Monocytes Relative: 5 %
Neutro Abs: 3.5 10*3/uL (ref 1.7–7.7)
Neutrophils Relative %: 55 %
Platelet Count: 218 10*3/uL (ref 150–400)
RBC: 3.68 MIL/uL — ABNORMAL LOW (ref 3.87–5.11)
RDW: 14 % (ref 11.5–15.5)
WBC Count: 6.5 10*3/uL (ref 4.0–10.5)
nRBC: 0 % (ref 0.0–0.2)

## 2022-10-25 LAB — CMP (CANCER CENTER ONLY)
ALT: 17 U/L (ref 0–44)
AST: 22 U/L (ref 15–41)
Albumin: 3.7 g/dL (ref 3.5–5.0)
Alkaline Phosphatase: 44 U/L (ref 38–126)
Anion gap: 5 (ref 5–15)
BUN: 18 mg/dL (ref 8–23)
CO2: 31 mmol/L (ref 22–32)
Calcium: 8.7 mg/dL — ABNORMAL LOW (ref 8.9–10.3)
Chloride: 106 mmol/L (ref 98–111)
Creatinine: 0.75 mg/dL (ref 0.44–1.00)
GFR, Estimated: >60 mL/min (ref >60)
Glucose, Bld: 103 mg/dL — ABNORMAL HIGH (ref 70–99)
Potassium: 3.8 mmol/L (ref 3.5–5.1)
Sodium: 142 mmol/L (ref 135–145)
Total Bilirubin: 0.4 mg/dL (ref 0.3–1.2)
Total Protein: 7 g/dL (ref 6.5–8.1)

## 2022-10-25 LAB — LACTATE DEHYDROGENASE: LDH: 156 U/L (ref 98–192)

## 2022-10-25 MED ORDER — DENOSUMAB 60 MG/ML ~~LOC~~ SOSY
60.0000 mg | PREFILLED_SYRINGE | Freq: Once | SUBCUTANEOUS | Status: AC
  Administered 2022-10-25: 60 mg via SUBCUTANEOUS
  Filled 2022-10-25: qty 1

## 2022-10-25 NOTE — Progress Notes (Signed)
1515 Spoke to Dr. Arbutus Ped about pt's calcium level of 8.7--states it is okay to give Prolia injection today.

## 2022-10-26 LAB — IGG, IGA, IGM
IgA: 38 mg/dL — ABNORMAL LOW (ref 64–422)
IgG (Immunoglobin G), Serum: 1734 mg/dL — ABNORMAL HIGH (ref 586–1602)
IgM (Immunoglobulin M), Srm: 21 mg/dL — ABNORMAL LOW (ref 26–217)

## 2022-10-26 LAB — KAPPA/LAMBDA LIGHT CHAINS
Kappa free light chain: 108.2 mg/L — ABNORMAL HIGH (ref 3.3–19.4)
Kappa, lambda light chain ratio: 10.11 — ABNORMAL HIGH (ref 0.26–1.65)
Lambda free light chains: 10.7 mg/L (ref 5.7–26.3)

## 2022-10-26 LAB — BETA 2 MICROGLOBULIN, SERUM: Beta-2 Microglobulin: 1.6 mg/L (ref 0.6–2.4)

## 2022-10-27 ENCOUNTER — Telehealth: Payer: Self-pay | Admitting: Internal Medicine

## 2022-10-27 NOTE — Telephone Encounter (Signed)
Called patient regarding October appointments, patient is notified.

## 2022-10-29 NOTE — Progress Notes (Unsigned)
Assessment/Plan:    1.  Parkinsons Disease  -we discussed changing to the CR so she doesn't have to take quite so often but she decided to hold for now.  Continue carbidopa/levodopa 25/100, 1 tablet at 7 AM/ 1/2 tablet at 9, 1/2 tablet at 11 AM/1/2 tablet at 1pm/1/2 tablet at 3 PM/1/2 tablet at 5pm/ 1/2 tablet at 7 PM  -continue amantadine 100 mg at 7am and 1 pm for significant dyskinesia.    2.  Sialorrhea  -has not done xeomin since since February 12, 2022.  Not exactly sure what happened, but we will go ahead and ***get her rescheduled.  3.  RBD  -Continue clonazepam, 0.5 mg, half tablet at bedtime.  -Use bed rails.  -***Add melatonin  4.  Constipation with what sounds like overflow incontinence  -saw GI NP - Treatment with benefiber recommended and helping  5.  Depression  -On venlafaxine.  Subjective:   [name] was seen today in follow up for Parkinsons disease.  My previous records were reviewed prior to todays visit as well as outside records available to me.   Patient has had no falls since our last visit.  Last visit, I talked to her about getting bed rails to ensure bedroom safety, as she had had a fall out of the bed from RBD.  Current prescribed movement disorder medications: carbidopa/levodopa 25/100, 1 tablet at 7 AM/ 1/2 tablet at 9, 1/2 tablet at 11 AM/1/2 tablet at 1pm/1/2 tablet at 3 PM/1/2 tablet at 5pm/ 1/2 tablet at 7 PM  Amantadine, 100 mg at 7 AM/1 PM (added last visit) Clonazepam 0.5 mg, half tablet at bed   PREVIOUS MEDICATIONS: has clonazepam but rarely takes  ALLERGIES:   Allergies  Allergen Reactions   Penicillins Shortness Of Breath and Itching    CURRENT MEDICATIONS:  Outpatient Encounter Medications as of 11/01/2022  Medication Sig   amantadine (SYMMETREL) 100 MG capsule TAKE ONE CAPSULE BY MOUTH TWICE DAILY AT 7am AND 1pm   b complex vitamins tablet Take 1 tablet by mouth daily.   Calcium 1500 MG tablet Take 1,500 mg by mouth.    carbidopa-levodopa (SINEMET IR) 25-100 MG tablet 1 at 7 AM/ 1/2 at 9, 1/2 at 11 AM/1/2 at 1pm/1/2 at 3 PM/1/2 at 5pm/ 1/2 at 7 PM   Cholecalciferol 5000 units TABS Take 1 tablet (5,000 Units total) by mouth daily.   clonazePAM (KLONOPIN) 0.5 MG tablet TAKE 1/2 TABLET BY MOUTH AT BEDTIME   Denosumab (PROLIA [place]) Inject into the skin every 6 (six) months.   Elastic Bandages & Supports (ABDOMINAL BINDER/ELASTIC SMALL) MISC 1 Device by Does not apply route daily.   methocarbamol (ROBAXIN) 500 MG tablet TAKE 1 TABLET EVERY 8 HOURS AS NEEDED FOR MUSCLE SPASM (Patient not taking: Reported on 04/22/2022)   Multiple Vitamin (MULTIVITAMIN) capsule 2 tabs po qd   Omega-3 Fatty Acids (OMEGA 3 PO) 1 tab po qd   omeprazole (PRILOSEC) 20 MG capsule TAKE (1) CAPSULE BY MOUTH ONCE DAILY.   OnabotulinumtoxinA (BOTOX IM) Inject into the muscle every 3 (three) months. Injection every 3 months   tamoxifen (NOLVADEX) 20 MG tablet TAKE 1 TABLET BY MOUTH ONCE DAILY.   venlafaxine XR (EFFEXOR-XR) 37.5 MG 24 hr capsule Take 3 capsules (112.5 mg total) by mouth daily with breakfast.   vitamin B-12 (CYANOCOBALAMIN) 500 MCG tablet Take 2500 mcg daily.   Facility-Administered Encounter Medications as of 11/01/2022  Medication   incobotulinumtoxinA (XEOMIN) 100 units injection 100 Units  Objective:   PHYSICAL EXAMINATION:    VITALS:   There were no vitals filed for this visit.  GEN:  The patient appears stated age and is in NAD. HEENT:  Normocephalic, atraumatic.  The mucous membranes are moist. The superficial temporal arteries are without ropiness or tenderness.  Neurological examination:  Orientation: The patient is alert and oriented x3. Cranial nerves: There is good facial symmetry with facial hypomimia. The speech is fluent and hypophonic. Soft palate rises symmetrically and there is no tongue deviation. Hearing is intact to conversational tone. Sensation: Sensation is intact to light touch  throughout Motor: Strength is at least antigravity x4.  Movement examination: Tone: There is nl tone in the UE/LE Abnormal movements: she has mod LUE dyskinesia but none elsewhere which is improved Coordination:  There is mild to mod decremation with toe taps bilaterally.  Other RAMs are good Gait and Station: The patient pushes off to arise.  She ambulates well today with her pronged cane.    Total time spent on today's visit was *** minutes, including both face-to-face time and nonface-to-face time.  Time included that spent on review of records (prior notes available to me/labs/imaging if pertinent), discussing treatment and goals, answering patient's questions and coordinating care.  Cc:  Joaquim Nam, MD

## 2022-11-01 ENCOUNTER — Other Ambulatory Visit: Payer: Self-pay | Admitting: Neurology

## 2022-11-01 ENCOUNTER — Inpatient Hospital Stay: Payer: Medicare Other | Admitting: Internal Medicine

## 2022-11-01 ENCOUNTER — Encounter: Payer: Self-pay | Admitting: Internal Medicine

## 2022-11-01 ENCOUNTER — Telehealth: Payer: Self-pay | Admitting: Pharmacy Technician

## 2022-11-01 ENCOUNTER — Other Ambulatory Visit (HOSPITAL_COMMUNITY): Payer: Self-pay

## 2022-11-01 ENCOUNTER — Ambulatory Visit (INDEPENDENT_AMBULATORY_CARE_PROVIDER_SITE_OTHER): Payer: Medicare Other | Admitting: Neurology

## 2022-11-01 ENCOUNTER — Encounter: Payer: Self-pay | Admitting: Neurology

## 2022-11-01 VITALS — BP 128/78 | HR 71 | Wt 113.4 lb

## 2022-11-01 DIAGNOSIS — G20A1 Parkinson's disease without dyskinesia, without mention of fluctuations: Secondary | ICD-10-CM

## 2022-11-01 DIAGNOSIS — F330 Major depressive disorder, recurrent, mild: Secondary | ICD-10-CM | POA: Diagnosis not present

## 2022-11-01 DIAGNOSIS — G20B2 Parkinson's disease with dyskinesia, with fluctuations: Secondary | ICD-10-CM

## 2022-11-01 MED ORDER — CLONAZEPAM 0.5 MG PO TABS
ORAL_TABLET | ORAL | 1 refills | Status: DC
Start: 2022-11-01 — End: 2023-02-14

## 2022-11-01 NOTE — Telephone Encounter (Signed)
Pharmacy Patient Advocate Encounter   Received notification from Physician's Office that prior authorization for XEOMIN 100U is required/requested.   Insurance verification completed.   The patient is insured through [facility] .   Per test claim: PA required; PA submitted to [facility] via Fax Key/confirmation #/EOC [id number] Status is pending

## 2022-11-01 NOTE — Patient Instructions (Addendum)
You need to use a walker at ALL times!  STOP carbidopa/levodopa IR (the yellow pill) START rytary 145 mg, 1 capsule at 7am/11am/3pm/7pm.  Try that for a few weeks.  If you are happy with that, call me and I will send in a RX.  If you aren't, try the rytary 195 mg, 1 capsule at 7am/11am/3pm/7pm Continue amantadine (red capsule) at 7am/11am  We discussed a nonprofit medical company called [employer].  They have all kinds of medical equipment including wheelchairs, walkers, canes and incontinence materials, which is free of charge.  Their phone number is [phone].  Their email is [email or web address].  Feel free to reach out to them with questions.  I believe that they are only open for pick up on Tuesdays but you can contact them other days of the week.

## 2022-11-02 NOTE — Telephone Encounter (Signed)
Pharmacy Patient Advocate Encounter  Received notification from [facility] that Prior Authorization for XEOMIN 100U has been CANCELLED due to   [employer] to check to see if Precertification or Prior Authorization is needed.  Pt has Medicare Supplement F plan Pt plan is not code specific.  No out of pocket max. $240 goes towards Medicare deductible. 20% Medicare co-insurance 100% covered toward doctor billable amount  PA #/Case ID/Reference #: [id number]

## 2022-11-03 NOTE — Progress Notes (Signed)
[facility] OFFICE PROGRESS NOTE  Joaquim Nam, MD [address] [place] [address]  DIAGNOSIS:  1) Node-negative breast cancer status post completion of radiation 05/06/2010 currently on tamoxifen as well as prolia.  2) Monoclonal gammopathy of unknown significance on observation since 2007   PRIOR THERAPY: 1) status post right lumpectomy on 03/05/2010 and it showed invasive ductal carcinoma 0.9 CM, Positive for ER/PR and negative for HER-2, with ductal carcinoma in situ present and negative sentinel lymph node biopsies.  2) status post adjuvant radiotherapy completed in April of 2012   CURRENT THERAPY:  1) tamoxifen 20 mg by mouth daily started in 2012. Will discontinue at this point.  2) Prolia subcutaneous injection every 6 months, last given on 10/25/22  INTERVAL HISTORY: [name] 82 y.o. female returns to the clinic today for an annual follow-up visit.  The patient was last seen by Dr. Arbutus Ped 1 year ago.  The patient is followed for MGUS.  She also has a history of breast cancer in 2012.  She is feeling fairly well today without any concerning complaints.  In general, she has low energy. She also fell a few days ago. She has Parkinson's. She has a cane for ambulation. She is supposed to be using a walker. She is expected to start PT this week 2x per week for 6 weeks. Denies any fever, chills, night sweats, or unexplained weight loss. Denies any abnormal bleeding or bruising.  Denies any chest pain, shortness of breath, cough, or hemoptysis.  Denies any signs and symptoms of infection.  Denies any unusual bone pain except her fell on her right knee when she fell. Overall, she states the pain is improving. She recently had a repeat myeloma panel performed.  She is here today for evaluation to review her results.  MEDICAL HISTORY: Past Medical History:  Diagnosis Date   Allergy    SEASONAL   Anemia    Anemia-NOS / PMH, Dr Arbutus Ped   Arthritis     Breast cancer (HCC)    stage I right breast   Cataract    BILATERAL   Decreased vision    R eye   Esophageal Stricture    Hyperlipidemia    Monoclonal gammopathy    MVP (mitral valve prolapse)    Osteoporosis    Parkinson disease (HCC)    Skin cancer    basal cell L neck   Vertical diplopia     ALLERGIES:  is allergic to penicillins.  MEDICATIONS:  Current Outpatient Medications  Medication Sig Dispense Refill   amantadine (SYMMETREL) 100 MG capsule TAKE ONE CAPSULE BY MOUTH TWICE DAILY AT 7am AND 1pm 180 capsule 0   b complex vitamins tablet Take 1 tablet by mouth daily.     Calcium 1500 MG tablet Take 1,500 mg by mouth.     Cholecalciferol 5000 units TABS Take 1 tablet (5,000 Units total) by mouth daily.     clonazePAM (KLONOPIN) 0.5 MG tablet TAKE 1/2 TABLET BY MOUTH AT BEDTIME 45 tablet 1   Denosumab (PROLIA Lydia) Inject into the skin every 6 (six) months.     Elastic Bandages & Supports (ABDOMINAL BINDER/ELASTIC SMALL) MISC 1 Device by Does not apply route daily. 1 each 0   methocarbamol (ROBAXIN) 500 MG tablet TAKE 1 TABLET EVERY 8 HOURS AS NEEDED FOR MUSCLE SPASM 90 tablet 0   Multiple Vitamin (MULTIVITAMIN) capsule 2 tabs po qd     Omega-3 Fatty Acids (OMEGA 3 PO)  1 tab po qd     omeprazole (PRILOSEC) 20 MG capsule TAKE (1) CAPSULE BY MOUTH ONCE DAILY. 90 capsule 2   OnabotulinumtoxinA (BOTOX IM) Inject into the muscle every 3 (three) months. Injection every 3 months     tamoxifen (NOLVADEX) 20 MG tablet TAKE 1 TABLET BY MOUTH ONCE DAILY. 90 tablet 11   venlafaxine XR (EFFEXOR-XR) 37.5 MG 24 hr capsule Take 3 capsules (112.5 mg total) by mouth daily with breakfast. 90 capsule 12   vitamin B-12 (CYANOCOBALAMIN) 500 MCG tablet Take 2500 mcg daily.     Current Facility-Administered Medications  Medication Dose Route Frequency Provider Last Rate Last Admin   incobotulinumtoxinA (XEOMIN) 100 units injection 100 Units  100 Units Intramuscular Q90 days Tat, Octaviano Batty, DO   100  Units at 02/12/22 0272    SURGICAL HISTORY:  Past Surgical History:  Procedure Laterality Date   APPENDECTOMY  2002   BONE BIOPSY  11/06/10   BREAST LUMPECTOMY  02/2010   right breast lumpectomy and sentinel node biopsy   CARDIAC CATHETERIZATION  2005   neg   CATARACT EXTRACTION, BILATERAL  2018   COLONOSCOPY     negative    HEMORRHOID SURGERY  1970's   SALPINGOOPHORECTOMY  2002   for benign growths   SHOULDER SURGERY Right    TOTAL ABDOMINAL HYSTERECTOMY W/ BILATERAL SALPINGOOPHORECTOMY  82 yrs old   for pain,prolapse ; G3 P2   UPPER GASTROINTESTINAL ENDOSCOPY      REVIEW OF SYSTEMS:   Review of Systems  Constitutional: Positive for fatigue. Negative for appetite change, chills, fever and unexpected weight change.  HENT: Negative for mouth sores, nosebleeds, sore throat and trouble swallowing.   Eyes: Negative for eye problems and icterus.  Respiratory: Negative for cough, hemoptysis, shortness of breath and wheezing.   Cardiovascular: Negative for chest pain and leg swelling.  Gastrointestinal: Negative for abdominal pain, constipation, diarrhea, nausea and vomiting.  Genitourinary: Negative for bladder incontinence, difficulty urinating, dysuria, frequency and hematuria.   Musculoskeletal: Negative for back pain, gait problem, neck pain and neck stiffness.  Skin: Negative for itching and rash.  Neurological: Positive for tremor. Negative for dizziness, extremity weakness, gait problem, headaches, light-headedness and seizures.  Hematological: Negative for adenopathy. Does not bruise/bleed easily.  Psychiatric/Behavioral: Negative for confusion, depression and sleep disturbance. The patient is not nervous/anxious.     PHYSICAL EXAMINATION:  Blood pressure (!) 152/81, pulse 88, temperature 97.6 F (36.4 C), temperature source Oral, resp. rate 16, weight 111 lb 6.4 oz (50.5 kg), SpO2 100%.  ECOG PERFORMANCE STATUS: 2  Physical Exam  Constitutional: Oriented to person,  place, and time and thin appearing female, and in no distress.  HENT:  Head: Normocephalic and atraumatic.  Mouth/Throat: Oropharynx is clear and moist. No oropharyngeal exudate.  Eyes: Conjunctivae are normal. Right eye exhibits no discharge. Left eye exhibits no discharge. No scleral icterus.  Neck: Normal range of motion. Neck supple.  Cardiovascular: Normal rate, regular rhythm, normal heart sounds and intact distal pulses.   Pulmonary/Chest: Effort normal and breath sounds normal. No respiratory distress. No wheezes. No rales.  Abdominal: Soft. Bowel sounds are normal. Exhibits no distension and no mass. There is no tenderness.  Musculoskeletal: Normal range of motion. Exhibits no edema.  Lymphadenopathy:    No cervical adenopathy.  Neurological: Positive for tremor. Alert and oriented to person, place, and time. Positive for muscle wasting. Examined in the chair.  Skin: Skin is warm and dry. No rash noted. Not diaphoretic. No  erythema. No pallor.  Psychiatric: Mood, memory and judgment normal.  Vitals reviewed.  LABORATORY DATA: Lab Results  Component Value Date   WBC 6.5 10/25/2022   HGB 12.1 10/25/2022   HCT 37.7 10/25/2022   MCV 102.4 (H) 10/25/2022   PLT 218 10/25/2022      Chemistry      Component Value Date/Time   NA 142 10/25/2022 1249   NA 141 10/13/2016 1141   K 3.8 10/25/2022 1249   K 3.6 10/13/2016 1141   CL 106 10/25/2022 1249   CL 107 03/21/2012 1501   CO2 31 10/25/2022 1249   CO2 27 10/13/2016 1141   BUN 18 10/25/2022 1249   BUN 20.0 10/13/2016 1141   CREATININE 0.75 10/25/2022 1249   CREATININE 0.8 10/13/2016 1141      Component Value Date/Time   CALCIUM 8.7 (L) 10/25/2022 1249   CALCIUM 9.5 10/13/2016 1141   ALKPHOS 44 10/25/2022 1249   ALKPHOS 50 10/13/2016 1141   AST 22 10/25/2022 1249   AST 25 10/13/2016 1141   ALT 17 10/25/2022 1249   ALT 17 10/13/2016 1141   BILITOT 0.4 10/25/2022 1249   BILITOT 0.27 10/13/2016 1141        RADIOGRAPHIC STUDIES:  No results found.   ASSESSMENT/PLAN:  This is a very pleasant 82 year old Caucasian female with a history of breast adenocarcinoma and MGUS.  She previously had a bone marrow biopsy and aspirate that showed kappa restricted plasmacytosis averaging 10 to 15%.  The findings were characteristic of smoldering multiple myeloma but the patient is asymptomatic with no endorgan damage.  She had a repeat myeloma panel performed.  She was seen by Dr. Arbutus Ped today.  Her labs show stable disease. .   Dr. Arbutus Ped recommends that she continue on observation with repeat labs every 6 months and prolia. Weill see her in 1 year about 1 week after her repeat labs and prolia at that time.   She has been on tamoxifen since 2012. Discussed with Dr. Arbutus Ped and she is ok to discontinue this.   The patient was advised to call immediately if she has any concerning symptoms in the interval. The patient voices understanding of current disease status and treatment options and is in agreement with the current care plan. All questions were answered. The patient knows to call the clinic with any problems, questions or concerns. We can certainly see the patient much sooner if necessary    Orders Placed This Encounter  Procedures   CBC with Differential (Cancer Center Only)    Standing Status:   Standing    Number of Occurrences:   2    Standing Expiration Date:   11/08/2023   CMP (Cancer Center only)    Standing Status:   Standing    Number of Occurrences:   2    Standing Expiration Date:   11/08/2023   Lactate dehydrogenase (LDH)    Standing Status:   Standing    Number of Occurrences:   2    Standing Expiration Date:   11/08/2023   Kappa/lambda light chains    Standing Status:   Standing    Number of Occurrences:   2    Standing Expiration Date:   11/08/2023   QIG  (Quant. immunoglobulins  - IgG, IgA, IgM)    Standing Status:   Standing    Number of Occurrences:   2     Standing Expiration Date:   11/08/2023   Beta 2 microglobulin  Standing Status:   Standing    Number of Occurrences:   2    Standing Expiration Date:   11/08/2023     Johnette Abraham Makailey Hodgkin, PA-C 11/08/22  ADDENDUM: Hematology/Oncology Attending: I had a face-to-face encounter with the patient today.  I reviewed her record, lab and recommended her care plan.  This is a very pleasant 82 years old white female diagnosed with MGUS and has been on observation since 2007.  The patient also has a history of node-negative breast cancer status postlumpectomy and adjuvant radiotherapy in 2012.  She has been on treatment with hormonal therapy for more than 10 years and I strongly recommend for her to discontinue her treatment. The patient has been on observation for the MGUS and repeat myeloma panel today showed no concerning findings for disease progression.  She also continue treatment with Prolia for osteoporosis every 6 months. I recommended for the patient to continue on observation with repeat myeloma panel in 1 year. The patient was advised to call immediately if she has any other concerning symptoms in the interval. Disclaimer: This note was dictated with voice recognition software. Similar sounding words can inadvertently be transcribed and may be missed upon review. Lajuana Matte, MD

## 2022-11-04 ENCOUNTER — Other Ambulatory Visit: Payer: Self-pay

## 2022-11-04 ENCOUNTER — Ambulatory Visit (HOSPITAL_COMMUNITY): Payer: Medicare Other | Attending: Neurology | Admitting: Physical Therapy

## 2022-11-04 DIAGNOSIS — G20A1 Parkinson's disease without dyskinesia, without mention of fluctuations: Secondary | ICD-10-CM | POA: Diagnosis not present

## 2022-11-04 DIAGNOSIS — G20B2 Parkinson's disease with dyskinesia, with fluctuations: Secondary | ICD-10-CM | POA: Insufficient documentation

## 2022-11-04 DIAGNOSIS — Z9181 History of falling: Secondary | ICD-10-CM | POA: Insufficient documentation

## 2022-11-04 DIAGNOSIS — M6281 Muscle weakness (generalized): Secondary | ICD-10-CM | POA: Insufficient documentation

## 2022-11-04 DIAGNOSIS — R490 Dysphonia: Secondary | ICD-10-CM | POA: Diagnosis not present

## 2022-11-04 DIAGNOSIS — R262 Difficulty in walking, not elsewhere classified: Secondary | ICD-10-CM | POA: Diagnosis not present

## 2022-11-04 NOTE — Therapy (Signed)
OUTPATIENT PHYSICAL THERAPY LOWER EXTREMITY EVALUATION   Patient Name: [name] MRN: [id number] DOB:27-Sep-1940, 82 y.o., female Today's Date: 11/04/2022  END OF SESSION:  PT End of Session - 11/04/22 1613     Visit Number 1    Number of Visits 12    Date for PT Re-Evaluation 12/16/22    Authorization Type medicare    Progress Note Due on Visit 10    PT Start Time 1515    PT Stop Time 1600    PT Time Calculation (min) 45 min    Activity Tolerance Patient tolerated treatment well    Behavior During Therapy WFL for tasks assessed/performed             Past Medical History:  Diagnosis Date   Allergy    SEASONAL   Anemia    Anemia-NOS / PMH, Dr Arbutus Ped   Arthritis    Breast cancer (HCC)    stage I right breast   Cataract    BILATERAL   Decreased vision    R eye   Esophageal Stricture    Hyperlipidemia    Monoclonal gammopathy    MVP (mitral valve prolapse)    Osteoporosis    Parkinson disease (HCC)    Skin cancer    basal cell L neck   Vertical diplopia    Past Surgical History:  Procedure Laterality Date   APPENDECTOMY  2002   BONE BIOPSY  11/06/10   BREAST LUMPECTOMY  02/2010   right breast lumpectomy and sentinel node biopsy   CARDIAC CATHETERIZATION  2005   neg   CATARACT EXTRACTION, BILATERAL  2018   COLONOSCOPY     negative    HEMORRHOID SURGERY  1970's   SALPINGOOPHORECTOMY  2002   for benign growths   SHOULDER SURGERY Right    TOTAL ABDOMINAL HYSTERECTOMY W/ BILATERAL SALPINGOOPHORECTOMY  82 yrs old   for pain,prolapse ; G3 P2   UPPER GASTROINTESTINAL ENDOSCOPY     Patient Active Problem List   Diagnosis Date Noted   Black stools 02/27/2020   Healthcare maintenance 02/05/2019   Mitral valve prolapse 12/14/2018   Pain in right knee 11/28/2018   Livedo reticularis 09/28/2018   Abdominal pain 02/01/2018   Low back pain 12/29/2017   Dysuria 12/29/2017   Edema 11/13/2017   Right arm pain 02/03/2017   Recurrent falls 02/03/2017    Advance care planning 01/23/2017   Hematuria 07/20/2016   Headache 05/11/2015   Globus sensation 04/24/2015   Foot pain 02/25/2015   Shoulder pain 04/17/2014   Parkinson's disease (HCC) 06/05/2013   Medicare annual wellness visit, subsequent 05/02/2012   Neck pain 05/02/2012   Osteoporosis 03/08/2011   Breast cancer (HCC) 11/19/2010   GERD (gastroesophageal reflux disease) 08/01/2008   DYSPHAGIA UNSPECIFIED 08/01/2008   ORTHOSTATIC HYPOTENSION 06/28/2008   HLD (hyperlipidemia) 07/10/2007   MONOCLONAL GAMMOPATHY 07/10/2007   ANEMIA-NOS 07/10/2007   FATIGUE 07/10/2007    PCP: Crawford Givens  REFERRING PROVIDER: Vladimir Faster, DO  REFERRING DIAG: Parkinson   THERAPY DIAG:  Difficulty in walking, not elsewhere classified  Muscle weakness (generalized)  History of falling  Rationale for Evaluation and Treatment: Rehabilitation  ONSET DATE: 01/26/2022  SUBJECTIVE:   SUBJECTIVE STATEMENT: [name] states that she is falling to much.  She has fallen at least 8 times since April.  She has been using a cane for while.  She also has a walker.  PT states that she has had home health before but did not feel  that this helped much.   PERTINENT HISTORY: Parkinson's , OA  PAIN:  Are you having pain? Yes, pt slide yesterday and has a skin tear on her RT wrist this is painful for her today.   PRECAUTIONS: Fall WEIGHT BEARING RESTRICTIONS: No  FALLS:  Has patient fallen in last 6 months? Yes. Number of falls 8  LIVING ENVIRONMENT: Lives with: lives alone Lives in: House/apartment Stairs: Yes: External: 10 steps; on right going up Has following equipment at home: Single point cane, Walker - 2 wheeled, and [employer] - 4 wheeled    PLOF: Independent with basic ADLs  PATIENT GOALS: to stop falling as much   NEXT MD VISIT: not sure  OBJECTIVE:  Note: Objective measures were completed at Evaluation unless otherwise noted.   COGNITION: Overall cognitive status: Within  functional limits for tasks assessed     SENSATION: WFL  deg  POSTURE: rounded shoulders, decreased lumbar lordosis, and decreased thoracic kyphosis    LOWER EXTREMITY MMT:  MMT Right eval Left eval  Hip flexion 5 5  Hip extension 3 3  Hip abduction 4+  3+  Hip adduction    Hip internal rotation    Hip external rotation    Knee flexion 3+ 3+  Knee extension 4+ 5  Ankle dorsiflexion 3+ 3+  Ankle plantarflexion    Ankle inversion    Ankle eversion    FUNCTIONAL TESTS:  30 seconds chair stand test:  2 x in 30"  2 minute walk test: with cane 292 ft with knees bend and decreased ankle motion  Single leg stance:  RT: 4"; Lt 55"    TODAY'S TREATMENT:                                                                                                                              DATE: 11/04/22: Evaluation Supine: Bridge x 10 LTR x 10 Prone: Hip extension x 10 Sit Sit to stand x 5 Power UP, Rock, twist , and step x 5      PATIENT EDUCATION:  Education details: HEP as below as well as POWER in sitting position Person educated: Patient Education method: Explanation, Verbal cues, and Handouts Education comprehension: verbalized understanding and returned demonstration  HOME EXERCISE PROGRAM: Access Code: [email or web address] URL: [email or web address] Date: 11/04/2022 Prepared by: Virgina Organ  Exercises - Supine Bridge  - 2 x daily - 7 x weekly - 1 sets - 10 reps - 5 seconds hold - Supine Lower Trunk Rotation  - 2 x daily - 7 x weekly - 1 sets - 10 reps - 3 seconds  hold - Sit to Stand with Armchair  - 2 x daily - 7 x weekly - 1 sets - 10 reps - Prone Hip Extension  - 1 x daily - 7 x weekly - 2 sets - 10 reps - 3 seconds  hold  ASSESSMENT:  CLINICAL IMPRESSION: Patient is an 82  y.o. female who was seen today  for physical therapy evaluation and treatment for Parkinsons. Evaluation demonstrates multiple falls, decreased balance, decreased strength, and decreased  activity tolerance.  [name] will benefit from skilled PT to address these issues and decrease her falls.   OBJECTIVE IMPAIRMENTS: Abnormal gait, decreased activity tolerance, decreased balance, decreased mobility, decreased strength, and increased fascial restrictions.   ACTIVITY LIMITATIONS: carrying, standing, and locomotion level  PARTICIPATION LIMITATIONS: shopping and community activity  REAB POTENTIAL: Good  CLINICAL DECISION MAKING: Evolving/moderate complexity  EVALUATION COMPLEXITY: Moderate   GOALS: Goals reviewed with patient? No  SHORT TERM GOALS: Target date: 11/25/22 PT to be I in HEP in order to improve strength and balance to have not fallen in the past two weeks  Baseline: Goal status: INITIAL  2.  PT to be able to complete 5 sit to stand in a 30 second period.   Baseline:  Goal status: INITIAL  3.  PT to be able to balance on her left leg for 10 seconds to reduce risk of falling  Baseline:  Goal status: INITIAL   LONG TERM GOALS: Target date: 12/17/22  PT to be I in an advanced HEP in order to have not had a fall in the past five weeks.  Baseline:  Goal status: INITIAL  2.  PT LE strength to be increased by one grade to allow pt to come sit to stand from a couch without difficulty  Baseline:  Goal status: INITIAL  3.  Pt to be able to single leg stance on her Lt leg for 20 seconds to decrease risk of falling  Baseline:  Goal status: INITIAL    PLAN:  PT FREQUENCY: 2x/week  PT DURATION: 6 weeks  PLANNED INTERVENTIONS: 97146- PT Re-evaluation, 97110-Therapeutic exercises, 97530- Therapeutic activity, 97112- Neuromuscular re-education, 97535- Self Care, and Patient/Family education  PLAN FOR NEXT SESSION: begin Big walking forward, Backward and side step, sit to stand and POWER in supine  Virgina Organ, PT CLT [phone]  11/04/2022, 4:28 PM

## 2022-11-08 ENCOUNTER — Inpatient Hospital Stay: Payer: Medicare Other | Attending: Physician Assistant | Admitting: Physician Assistant

## 2022-11-08 VITALS — BP 152/81 | HR 88 | Temp 97.6°F | Resp 16 | Wt 111.4 lb

## 2022-11-08 DIAGNOSIS — R251 Tremor, unspecified: Secondary | ICD-10-CM | POA: Diagnosis not present

## 2022-11-08 DIAGNOSIS — R5383 Other fatigue: Secondary | ICD-10-CM | POA: Diagnosis not present

## 2022-11-08 DIAGNOSIS — Z853 Personal history of malignant neoplasm of breast: Secondary | ICD-10-CM | POA: Diagnosis not present

## 2022-11-08 DIAGNOSIS — Z880 Allergy status to penicillin: Secondary | ICD-10-CM | POA: Insufficient documentation

## 2022-11-08 DIAGNOSIS — Z8719 Personal history of other diseases of the digestive system: Secondary | ICD-10-CM | POA: Insufficient documentation

## 2022-11-08 DIAGNOSIS — D472 Monoclonal gammopathy: Secondary | ICD-10-CM | POA: Insufficient documentation

## 2022-11-08 DIAGNOSIS — G20A1 Parkinson's disease without dyskinesia, without mention of fluctuations: Secondary | ICD-10-CM | POA: Diagnosis not present

## 2022-11-08 DIAGNOSIS — Z9049 Acquired absence of other specified parts of digestive tract: Secondary | ICD-10-CM | POA: Diagnosis not present

## 2022-11-08 DIAGNOSIS — Z79899 Other long term (current) drug therapy: Secondary | ICD-10-CM | POA: Diagnosis not present

## 2022-11-08 DIAGNOSIS — Z90710 Acquired absence of both cervix and uterus: Secondary | ICD-10-CM | POA: Diagnosis not present

## 2022-11-08 DIAGNOSIS — Z85828 Personal history of other malignant neoplasm of skin: Secondary | ICD-10-CM | POA: Diagnosis not present

## 2022-11-08 DIAGNOSIS — Z79810 Long term (current) use of selective estrogen receptor modulators (SERMs): Secondary | ICD-10-CM | POA: Diagnosis not present

## 2022-11-08 DIAGNOSIS — Z923 Personal history of irradiation: Secondary | ICD-10-CM | POA: Insufficient documentation

## 2022-11-08 DIAGNOSIS — Z90722 Acquired absence of ovaries, bilateral: Secondary | ICD-10-CM | POA: Diagnosis not present

## 2022-11-09 ENCOUNTER — Ambulatory Visit (HOSPITAL_COMMUNITY): Payer: Medicare Other | Admitting: Physical Therapy

## 2022-11-09 DIAGNOSIS — R262 Difficulty in walking, not elsewhere classified: Secondary | ICD-10-CM | POA: Diagnosis not present

## 2022-11-09 DIAGNOSIS — M6281 Muscle weakness (generalized): Secondary | ICD-10-CM | POA: Diagnosis not present

## 2022-11-09 DIAGNOSIS — Z9181 History of falling: Secondary | ICD-10-CM | POA: Diagnosis not present

## 2022-11-09 DIAGNOSIS — G20A1 Parkinson's disease without dyskinesia, without mention of fluctuations: Secondary | ICD-10-CM | POA: Diagnosis not present

## 2022-11-09 DIAGNOSIS — R490 Dysphonia: Secondary | ICD-10-CM | POA: Diagnosis not present

## 2022-11-09 DIAGNOSIS — G20B2 Parkinson's disease with dyskinesia, with fluctuations: Secondary | ICD-10-CM | POA: Diagnosis not present

## 2022-11-09 NOTE — Therapy (Signed)
OUTPATIENT PHYSICAL THERAPY LOWER EXTREMITY Treatment   Patient Name: [name] MRN: [id number] DOB:09-Sep-1940, 82 y.o., female Today's Date: 11/09/2022  END OF SESSION:  PT End of Session - 11/09/22 1102     Visit Number 2    Number of Visits 12    Date for PT Re-Evaluation 12/16/22    Authorization Type medicare    Progress Note Due on Visit 10    PT Start Time 1016    PT Stop Time 1102    PT Time Calculation (min) 46 min    Activity Tolerance Patient tolerated treatment well    Behavior During Therapy WFL for tasks assessed/performed              Past Medical History:  Diagnosis Date   Allergy    SEASONAL   Anemia    Anemia-NOS / PMH, Dr Arbutus Ped   Arthritis    Breast cancer (HCC)    stage I right breast   Cataract    BILATERAL   Decreased vision    R eye   Esophageal Stricture    Hyperlipidemia    Monoclonal gammopathy    MVP (mitral valve prolapse)    Osteoporosis    Parkinson disease (HCC)    Skin cancer    basal cell L neck   Vertical diplopia    Past Surgical History:  Procedure Laterality Date   APPENDECTOMY  2002   BONE BIOPSY  11/06/10   BREAST LUMPECTOMY  02/2010   right breast lumpectomy and sentinel node biopsy   CARDIAC CATHETERIZATION  2005   neg   CATARACT EXTRACTION, BILATERAL  2018   COLONOSCOPY     negative    HEMORRHOID SURGERY  1970's   SALPINGOOPHORECTOMY  2002   for benign growths   SHOULDER SURGERY Right    TOTAL ABDOMINAL HYSTERECTOMY W/ BILATERAL SALPINGOOPHORECTOMY  82 yrs old   for pain,prolapse ; G3 P2   UPPER GASTROINTESTINAL ENDOSCOPY     Patient Active Problem List   Diagnosis Date Noted   Black stools 02/27/2020   Healthcare maintenance 02/05/2019   Mitral valve prolapse 12/14/2018   Pain in right knee 11/28/2018   Livedo reticularis 09/28/2018   Abdominal pain 02/01/2018   Low back pain 12/29/2017   Dysuria 12/29/2017   Edema 11/13/2017   Right arm pain 02/03/2017   Recurrent falls 02/03/2017    Advance care planning 01/23/2017   Hematuria 07/20/2016   Headache 05/11/2015   Globus sensation 04/24/2015   Foot pain 02/25/2015   Shoulder pain 04/17/2014   Parkinson's disease (HCC) 06/05/2013   Medicare annual wellness visit, subsequent 05/02/2012   Neck pain 05/02/2012   Osteoporosis 03/08/2011   Breast cancer (HCC) 11/19/2010   GERD (gastroesophageal reflux disease) 08/01/2008   DYSPHAGIA UNSPECIFIED 08/01/2008   ORTHOSTATIC HYPOTENSION 06/28/2008   HLD (hyperlipidemia) 07/10/2007   MONOCLONAL GAMMOPATHY 07/10/2007   ANEMIA-NOS 07/10/2007   FATIGUE 07/10/2007    PCP: Crawford Givens  REFERRING PROVIDER: Vladimir Faster, DO  REFERRING DIAG: Parkinson   THERAPY DIAG:  Difficulty in walking, not elsewhere classified  Muscle weakness (generalized)  History of falling  Rationale for Evaluation and Treatment: Rehabilitation  ONSET DATE: 01/26/2022  SUBJECTIVE:   SUBJECTIVE STATEMENT:Pt states that when she got home the HEP was confusing    Ms. Popham states that she is falling to much.  She has fallen at least 8 times since April.  She has been using a cane for while.  She also has a walker.  PT states that she has had home health before but did not feel that this helped much.   PERTINENT HISTORY: Parkinson's , OA  PAIN:  Are you having pain? Back we are not focusing on this, limited activity to not aggravate pt back  PRECAUTIONS: Fall WEIGHT BEARING RESTRICTIONS: No  FALLS:  Has patient fallen in last 6 months? Yes. Number of falls 8  LIVING ENVIRONMENT: Lives with: lives alone Lives in: House/apartment Stairs: Yes: External: 10 steps; on right going up Has following equipment at home: Single point cane, Walker - 2 wheeled, and [employer] - 4 wheeled    PLOF: Independent with basic ADLs  PATIENT GOALS: to stop falling as much   NEXT MD VISIT: not sure  OBJECTIVE:  Note: Objective measures were completed at Evaluation unless otherwise  noted.   COGNITION: Overall cognitive status: Within functional limits for tasks assessed     SENSATION: WFL  deg  POSTURE: rounded shoulders, decreased lumbar lordosis, and decreased thoracic kyphosis    LOWER EXTREMITY MMT:  MMT Right eval Left eval  Hip flexion 5 5  Hip extension 3 3  Hip abduction 4+  3+  Hip adduction    Hip internal rotation    Hip external rotation    Knee flexion 3+ 3+  Knee extension 4+ 5  Ankle dorsiflexion 3+ 3+  Ankle plantarflexion    Ankle inversion    Ankle eversion    FUNCTIONAL TESTS:  30 seconds chair stand test:  2 x in 30"  2 minute walk test: with cane 292 ft with knees bend and decreased ankle motion  Single leg stance:  RT: 4"; Lt 55"    TODAY'S TREATMENT:                                                                                                                              DATE:  11/09/22 Nustep x 5 minutes at 90 SPM Sitting and supine PWR: Power UP, Rock, twist , and step x 5  Large walking forward, side step and backward Sitting: throwing scarf and catching x 20 11/04/22: Evaluation Supine: Bridge x 10 LTR x 10 Prone: Hip extension x 10 Sit Sit to stand x 5 Power UP, Rock, twist , and step x 5      PATIENT EDUCATION:  Education details: HEP as below as well as POWER in sitting position Person educated: Patient Education method: Explanation, Verbal cues, and Handouts Education comprehension: verbalized understanding and returned demonstration  HOME EXERCISE PROGRAM: Access Code: [email or web address] URL: [email or web address] Date: 11/04/2022 Prepared by: Virgina Organ  Exercises - Supine Bridge  - 2 x daily - 7 x weekly - 1 sets - 10 reps - 5 seconds hold - Supine Lower Trunk Rotation  - 2 x daily - 7 x weekly - 1 sets - 10 reps - 3 seconds  hold - Sit to Stand with Armchair  - 2 x daily - 7 x weekly - 1  sets - 10 reps - Prone Hip Extension  - 1 x daily - 7 x weekly - 2 sets - 10 reps - 3  seconds  hold  ASSESSMENT:  CLINICAL IMPRESSION: Therapist reviewed evaluation and goals with patient.  Added supine PWR as well as PWR walking and catching scarf while sitting.  Pt continues to need strengthening and improved PWR.  Have pt count reps outloud loudly for voice.  Eval: Patient is an 82  y.o. female who was seen today for physical therapy evaluation and treatment for Parkinsons. Evaluation demonstrates multiple falls, decreased balance, decreased strength, and decreased activity tolerance.  Ms. Star will benefit from skilled PT to address these issues and decrease her falls.   OBJECTIVE IMPAIRMENTS: Abnormal gait, decreased activity tolerance, decreased balance, decreased mobility, decreased strength, and increased fascial restrictions.   ACTIVITY LIMITATIONS: carrying, standing, and locomotion level  PARTICIPATION LIMITATIONS: shopping and community activity  REAB POTENTIAL: Good  CLINICAL DECISION MAKING: Evolving/moderate complexity  EVALUATION COMPLEXITY: Moderate   GOALS: Goals reviewed with patient? No  SHORT TERM GOALS: Target date: 11/25/22 PT to be I in HEP in order to improve strength and balance to have not fallen in the past two weeks  Baseline: Goal status:  on-going   2.  PT to be able to complete 5 sit to stand in a 30 second period.   Baseline:  Goal status:  on-going   3.  PT to be able to balance on her left leg for 10 seconds to reduce risk of falling  Baseline:  Goal status:  on-going    LONG TERM GOALS: Target date: 12/17/22  PT to be I in an advanced HEP in order to have not had a fall in the past five weeks.  Baseline:  Goal status: on-going   2.  PT LE strength to be increased by one grade to allow pt to come sit to stand from a couch without difficulty  Baseline:  Goal status:  on-going  3.  Pt to be able to single leg stance on her Lt leg for 20 seconds to decrease risk of falling  Baseline:  Goal status:  on-going     PLAN:  PT FREQUENCY: 2x/week  PT DURATION: 6 weeks  PLANNED INTERVENTIONS: 97146- PT Re-evaluation, 97110-Therapeutic exercises, 97530- Therapeutic activity, 97112- Neuromuscular re-education, 97535- Self Care, and Patient/Family education  PLAN FOR NEXT SESSION: begin Big walking forward, Backward and side step, sit to stand and POWER in supine  Virgina Organ, PT CLT [phone]  11/09/2022, 11:02 AM

## 2022-11-11 ENCOUNTER — Ambulatory Visit (HOSPITAL_COMMUNITY): Payer: Medicare Other

## 2022-11-11 DIAGNOSIS — M6281 Muscle weakness (generalized): Secondary | ICD-10-CM | POA: Diagnosis not present

## 2022-11-11 DIAGNOSIS — R490 Dysphonia: Secondary | ICD-10-CM | POA: Diagnosis not present

## 2022-11-11 DIAGNOSIS — G20A1 Parkinson's disease without dyskinesia, without mention of fluctuations: Secondary | ICD-10-CM | POA: Diagnosis not present

## 2022-11-11 DIAGNOSIS — G20B2 Parkinson's disease with dyskinesia, with fluctuations: Secondary | ICD-10-CM | POA: Diagnosis not present

## 2022-11-11 DIAGNOSIS — R262 Difficulty in walking, not elsewhere classified: Secondary | ICD-10-CM

## 2022-11-11 DIAGNOSIS — Z9181 History of falling: Secondary | ICD-10-CM | POA: Diagnosis not present

## 2022-11-11 NOTE — Therapy (Signed)
OUTPATIENT PHYSICAL THERAPY TREATMENT   Patient Name: [name] MRN: [id number] DOB:01/08/1941, 82 y.o., female Today's Date: 11/11/2022  END OF SESSION:  PT End of Session - 11/11/22 1519     Visit Number 3    Number of Visits 12    Authorization Type medicare, BCBS Supplement    Progress Note Due on Visit 1015    PT Start Time 1515    PT Stop Time 1555    PT Time Calculation (min) 40 min    Equipment Utilized During Treatment Gait belt    Activity Tolerance Patient tolerated treatment well;No increased pain    Behavior During Therapy WFL for tasks assessed/performed              Past Medical History:  Diagnosis Date   Allergy    SEASONAL   Anemia    Anemia-NOS / PMH, Dr [name]   Arthritis    Breast cancer [facility])    stage I right breast   Cataract    BILATERAL   Decreased vision    R eye   Esophageal Stricture    Hyperlipidemia    Monoclonal gammopathy    MVP (mitral valve prolapse)    Osteoporosis    Parkinson disease (HCC)    Skin cancer    basal cell L neck   Vertical diplopia    Past Surgical History:  Procedure Laterality Date   APPENDECTOMY  2002   BONE BIOPSY  11/06/10   BREAST LUMPECTOMY  02/2010   right breast lumpectomy and sentinel node biopsy   CARDIAC CATHETERIZATION  2005   neg   CATARACT EXTRACTION, BILATERAL  2018   COLONOSCOPY     negative    HEMORRHOID SURGERY  1970's   SALPINGOOPHORECTOMY  2002   for benign growths   SHOULDER SURGERY Right    TOTAL ABDOMINAL HYSTERECTOMY W/ BILATERAL SALPINGOOPHORECTOMY  82 yrs old   for pain,prolapse ; G3 P2   UPPER GASTROINTESTINAL ENDOSCOPY     Patient Active Problem List   Diagnosis Date Noted   Black stools 02/27/2020   Healthcare maintenance 02/05/2019   Mitral valve prolapse 12/14/2018   Pain in right knee 11/28/2018   Livedo reticularis 09/28/2018   Abdominal pain 02/01/2018   Low back pain 12/29/2017   Dysuria 12/29/2017   Edema 11/13/2017   Right arm pain 02/03/2017    Recurrent falls 02/03/2017   Advance care planning 01/23/2017   Hematuria 07/20/2016   Headache 05/11/2015   Globus sensation 04/24/2015   Foot pain 02/25/2015   Shoulder pain 04/17/2014   Parkinson's disease (HCC) 06/05/2013   Medicare annual wellness visit, subsequent 05/02/2012   Neck pain 05/02/2012   Osteoporosis 03/08/2011   Breast cancer (HCC) 11/19/2010   GERD (gastroesophageal reflux disease) 08/01/2008   DYSPHAGIA UNSPECIFIED 08/01/2008   ORTHOSTATIC HYPOTENSION 06/28/2008   HLD (hyperlipidemia) 07/10/2007   MONOCLONAL GAMMOPATHY 07/10/2007   ANEMIA-NOS 07/10/2007   FATIGUE 07/10/2007    PCP: [name]  REFERRING PROVIDER: Vladimir Faster, DO  REFERRING DIAG: Parkinson   THERAPY DIAG:  Difficulty in walking, not elsewhere classified  Muscle weakness (generalized)  History of falling  Rationale for Evaluation and Treatment: Rehabilitation  ONSET DATE: 01/26/2022  SUBJECTIVE:   SUBJECTIVE STATEMENT: Pt reports recently seeing neurologist who is trialing her on anew PD medication for acouple weeks, also asked to DC tamoxifen. No other updates since that time.    PERTINENT HISTORY: [name] states that she is falling to much.  She has  fallen at least 8 times since April.  She has been using a cane for while.  She also has a walker.  PT states that she has had home health before but did not feel that this helped much. PMH: Parkinson's, OA .  PAIN:  Are you having pain? Back we are not focusing on this, limited activity to not aggravate pt back   PRECAUTIONS: Fall WEIGHT BEARING RESTRICTIONS: No  FALLS:  Has patient fallen in last 6 months? Yes. Number of falls 8  PLOF: Independent with basic ADLs  PATIENT GOALS: to stop falling as much   NEXT MD VISIT: not sure  OBJECTIVE:  Note: Objective measures were completed at Evaluation unless otherwise noted.   COGNITION: Overall cognitive status: Within functional limits for tasks  assessed     SENSATION: WFL  deg  POSTURE: rounded shoulders, decreased lumbar lordosis, and decreased thoracic kyphosis  LOWER EXTREMITY MMT:  MMT Right eval Left eval  Hip flexion 5 5  Hip extension 3 3  Hip abduction 4+  3+  Knee flexion 3+ 3+  Knee extension 4+ 5  Ankle dorsiflexion 3+ 3+   FUNCTIONAL TESTS:  30 seconds chair stand test:  2 x in 30"  2 minute walk test: with cane 292 ft with knees bend and decreased ankle motion  Single leg stance:  RT: 4"; Lt 55"   TODAY'S TREATMENT:                                                                                                                              DATE:    11/11/22: -PWR! Moves up x10, twist x10 bilat  -seated bouncy ball rebounding off wall (about 7' target) x15, then 15x seated on wobble disc (excellent explosive reaction timing catch catching)   -standing hips to wall, trunk extension pillow crush isometric 15x3secH   -AMB high intensity intervals, max speed 6x 61ft c 3lb AW bilat (~1.65m/s average for final 3)   -78ft retroAMB c 3lb AW bilat, NO DEVICE, mingaurd to total A for LOB; beter control when cued to maintain slight forward lean and decrease gait speed -61ft lateral stepping c 3lb AW bilat bilat no device (no frontal plane balance issues)   PATIENT EDUCATION:  Education details: HEP as below as well as PWR! in sitting position Person educated: Patient Education method: Explanation, Verbal cues, and Handouts Education comprehension: verbalized understanding and returned demonstration  HOME EXERCISE PROGRAM: Access Code: [email or web address] URL: [email or web address] Date: 11/04/2022 Prepared by: [name]  Exercises - Supine Bridge  - 2 x daily - 7 x weekly - 1 sets - 10 reps - 5 seconds hold - Supine Lower Trunk Rotation  - 2 x daily - 7 x weekly - 1 sets - 10 reps - 3 seconds  hold - Sit to Stand with Armchair  - 2 x daily - 7 x weekly - 1 sets - 10 reps - Prone Hip Extension   -  1 x daily - 7 x weekly - 2 sets - 10 reps - 3 seconds  hold  ASSESSMENT:  CLINICAL IMPRESSION:  Continued focus on large amplitude movements and dynamic balance training. Pt continues to struggle with PWR! Moves at home, Chartered loss adjuster recommended accessing videos online. Trial of HIT training today with RW, gait speed impressive with excellent step length during activity. Pt also successfully trigger for high velocity movements during ball rebounding activity. Several LOB during retroAMBtraining, delayed awareness of sway, delayed corrective action, but fewer LOB with cues for postural and speed adjustments. Pt says she had fun at end of session. Chartered loss adjuster coordinate with DIL at [facility] to explain online availability of PWR! Moves videos if she is able to facilitate. PT will help patient achieve LT goals of care, reduce falls risk, maximize independence in ADL, reduce caregiver burden.     OBJECTIVE IMPAIRMENTS: Abnormal gait, decreased activity tolerance, decreased balance, decreased mobility, decreased strength, and increased fascial restrictions.   ACTIVITY LIMITATIONS: carrying, standing, and locomotion level  PARTICIPATION LIMITATIONS: shopping and community activity  REAB POTENTIAL: Good  CLINICAL DECISION MAKING: Evolving/moderate complexity  EVALUATION COMPLEXITY: Moderate   GOALS: Goals reviewed with patient? No  SHORT TERM GOALS: Target date: 11/25/22 PT to be I in HEP in order to improve strength and balance to have not fallen in the past two weeks  Baseline: Goal status:  on-going   2.  PT to be able to complete 5 sit to stand in a 30 second period.   Baseline:  Goal status:  on-going   3.  PT to be able to balance on her left leg for 10 seconds to reduce risk of falling  Baseline:  Goal status:  on-going    LONG TERM GOALS: Target date: 12/17/22  PT to be I in an advanced HEP in order to have not had a fall in the past five weeks.  Baseline:  Goal status: on-going   2.   PT LE strength to be increased by one grade to allow pt to come sit to stand from a couch without difficulty  Baseline:  Goal status:  on-going  3.  Pt to be able to single leg stance on her Lt leg for 20 seconds to decrease risk of falling  Baseline:  Goal status:  on-going    PLAN:  PT FREQUENCY: 2x/week  PT DURATION: 6 weeks  PLANNED INTERVENTIONS: 97146- PT Re-evaluation, 97110-Therapeutic exercises, 97530- Therapeutic activity, 97112- Neuromuscular re-education, 97535- Self Care, and Patient/Family education  PLAN FOR NEXT SESSION: begin Big walking forward, Backward and side step, sit to stand and POWER in supine  Rosamaria Lints, PT Physical Therapist [facility] [place]  [phone]    11/11/2022, 3:20 PM

## 2022-11-12 ENCOUNTER — Ambulatory Visit (INDEPENDENT_AMBULATORY_CARE_PROVIDER_SITE_OTHER): Payer: Medicare Other | Admitting: Neurology

## 2022-11-12 DIAGNOSIS — K117 Disturbances of salivary secretion: Secondary | ICD-10-CM

## 2022-11-12 MED ORDER — INCOBOTULINUMTOXINA 100 UNITS IM SOLR
100.0000 [IU] | INTRAMUSCULAR | Status: AC
Start: 2022-11-12 — End: ?
  Administered 2022-11-12: 100 [IU] via INTRAMUSCULAR

## 2022-11-12 NOTE — Procedures (Signed)
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  The patient was educated on the botulinum toxin the black blox warning and given a copy of the botox patient medication guide.  The patient understands that this warning states that there have been reported cases of the Botox extending beyond the injection site and creating adverse effects, similar to those of botulism. This included loss of strength, trouble walking, hoarseness, trouble saying words clearly, loss of bladder control, trouble breathing, trouble swallowing, diplopia, blurry vision and ptosis. Most of the distant spread of Botox was happening in patients, primarily children, who received medication for spasticity or for cervical dystonia. The patient expressed understanding and desire to proceed.   Injections  Location Left  Right Units Number of sites  Parotid (point 1 on picture) 30 30 60 1 per side  Submandibular (point 2) 20 20 40 1 per side  TOTAL UNITS:   100    Type of Toxin: Xeomin Discarded Units: 0  Needle drawback with each injection was free of blood. Pt tolerated procedure well without complications.   Reinjection is anticipated in 4 months.

## 2022-11-16 ENCOUNTER — Encounter (HOSPITAL_COMMUNITY): Payer: Self-pay | Admitting: Speech Pathology

## 2022-11-16 ENCOUNTER — Ambulatory Visit (HOSPITAL_COMMUNITY): Payer: Medicare Other | Admitting: Speech Pathology

## 2022-11-16 DIAGNOSIS — R262 Difficulty in walking, not elsewhere classified: Secondary | ICD-10-CM | POA: Diagnosis not present

## 2022-11-16 DIAGNOSIS — Z9181 History of falling: Secondary | ICD-10-CM | POA: Diagnosis not present

## 2022-11-16 DIAGNOSIS — G20A1 Parkinson's disease without dyskinesia, without mention of fluctuations: Secondary | ICD-10-CM

## 2022-11-16 DIAGNOSIS — G20B2 Parkinson's disease with dyskinesia, with fluctuations: Secondary | ICD-10-CM | POA: Diagnosis not present

## 2022-11-16 DIAGNOSIS — R490 Dysphonia: Secondary | ICD-10-CM | POA: Diagnosis not present

## 2022-11-16 DIAGNOSIS — M6281 Muscle weakness (generalized): Secondary | ICD-10-CM | POA: Diagnosis not present

## 2022-11-16 NOTE — Therapy (Signed)
OUTPATIENT SPEECH LANGUAGE PATHOLOGY PARKINSON'S EVALUATION   Patient Name: [name] MRN: [id number] DOB:[birth year], 82 y.o., female Today's Date: 11/16/2022  PCP: Megan Nam, MD REFERRING PROVIDER: Vladimir Faster, DO  END OF SESSION:  End of Session - 11/16/22 1822     Visit Number 1    Number of Visits 10    Date for SLP Re-Evaluation 12/30/22    Authorization Type Medicare Primary and BCBS secondary    SLP Start Time 1430    SLP Stop Time  1515    SLP Time Calculation (min) 45 min    Activity Tolerance Patient tolerated treatment well             Past Medical History:  Diagnosis Date   Allergy    SEASONAL   Anemia    Anemia-NOS / PMH, Dr Arbutus Ped   Arthritis    Breast cancer (HCC)    stage I right breast   Cataract    BILATERAL   Decreased vision    R eye   Esophageal Stricture    Hyperlipidemia    Monoclonal gammopathy    MVP (mitral valve prolapse)    Osteoporosis    Parkinson disease (HCC)    Skin cancer    basal cell L neck   Vertical diplopia    Past Surgical History:  Procedure Laterality Date   APPENDECTOMY  2002   BONE BIOPSY  11/06/10   BREAST LUMPECTOMY  02/2010   right breast lumpectomy and sentinel node biopsy   CARDIAC CATHETERIZATION  2005   neg   CATARACT EXTRACTION, BILATERAL  2018   COLONOSCOPY     negative    HEMORRHOID SURGERY  1970's   SALPINGOOPHORECTOMY  2002   for benign growths   SHOULDER SURGERY Right    TOTAL ABDOMINAL HYSTERECTOMY W/ BILATERAL SALPINGOOPHORECTOMY  82 yrs old   for pain,prolapse ; G3 P2   UPPER GASTROINTESTINAL ENDOSCOPY     Patient Active Problem List   Diagnosis Date Noted   Black stools 02/27/2020   Healthcare maintenance 02/05/2019   Mitral valve prolapse 12/14/2018   Pain in right knee 11/28/2018   Livedo reticularis 09/28/2018   Abdominal pain 02/01/2018   Low back pain 12/29/2017   Dysuria 12/29/2017   Edema 11/13/2017   Right arm pain 02/03/2017   Recurrent falls  02/03/2017   Advance care planning 01/23/2017   Hematuria 07/20/2016   Headache 05/11/2015   Globus sensation 04/24/2015   Foot pain 02/25/2015   Shoulder pain 04/17/2014   Parkinson's disease (HCC) 06/05/2013   Medicare annual wellness visit, subsequent 05/02/2012   Neck pain 05/02/2012   Osteoporosis 03/08/2011   Breast cancer (HCC) 11/19/2010   GERD (gastroesophageal reflux disease) 08/01/2008   DYSPHAGIA UNSPECIFIED 08/01/2008   ORTHOSTATIC HYPOTENSION 06/28/2008   HLD (hyperlipidemia) 07/10/2007   MONOCLONAL GAMMOPATHY 07/10/2007   ANEMIA-NOS 07/10/2007   FATIGUE 07/10/2007    ONSET DATE: 06/05/2013  REFERRING DIAG: Parkinson's disease  THERAPY DIAG:  Hypokinetic Parkinsonian dysphonia (HCC)  Rationale for Evaluation and Treatment: Rehabilitation  SUBJECTIVE:   SUBJECTIVE STATEMENT: "Tremor"  Pt accompanied by: self and family member  PERTINENT HISTORY: [name] is an 82 yo female who was referred for SLP evaluation and treatment for dysarthria in setting of Parkinson's Disease. Pt indicates she was diagnosed with PD in May of 2015 and did have some SLP therapy the following year in [place]. Pt recently had Xeomin for her sialorrhea.She was referred for speech evaluation by Dr. Lurena Joiner Tat,  DO.   PAIN:  Are you having pain? No  FALLS: Has patient fallen in last 6 months?  Yes  LIVING ENVIRONMENT: Lives with: lives alone Lives in: House/apartment  PLOF:  Level of assistance: Needed assistance with ADLs, Needed assistance with IADLS Employment: Retired  PATIENT GOALS: Increase speech intelligibility  OBJECTIVE:  Note: Objective measures were completed at Evaluation unless otherwise noted.  DIAGNOSTIC FINDINGS: N/A  COGNITION: Overall cognitive status: Within functional limits for tasks assessed Areas of impairment:  Comments: Pt lives alone, family assists prn  MOTOR SPEECH: Overall motor speech: impaired Level of impairment:  Word Respiration: clavicular breathing Phonation: normal and low vocal intensity Resonance: WFL Articulation: Appears intact Intelligibility: Intelligibility reduced Motor planning: Appears intact Motor speech errors: aware and unaware Interfering components:  N/A Effective technique: increased vocal intensity and Speak with intent  ORAL MOTOR EXAMINATION: Overall status: WFL Comments:   OBJECTIVE VOICE ASSESSMENT: Sustained "ah" maximum phonation time: 11 seconds Sustained "ah" loudness average: 82 dB Oral reading (passage) loudness average: 70 dB Oral reading loudness range: 69-72 dB Conversational loudness average: 78 dB Conversational loudness range:  Voice quality: diplophonia and tremor Stimulability trials: Given SLP modeling and occasional min cues, loudness average increased to 92dB (range of 86 to 92) at (loud "ah", word, sentence, paragraph, conversation) level.  Comments:   Completed audio recording of patients baseline voice without cueing from SLP: Yes  Pt does not report difficulty with swallowing which does not warrant further evaluation.   TODAY'S TREATMENT:  Evaluation only today and trial of Speakout! Therapy techniques                                                                                                                                       DATE: 11/16/22  PATIENT EDUCATION: Education details: SPEAK OUT! Therapy workbook ordered Person educated: Patient and Child(ren) Education method: Explanation Education comprehension: verbalized understanding  HOME EXERCISE PROGRAM: Pt will completed HEP as assigned to facilitate carryover of speech tasks in home and community environment.   GOALS: Goals reviewed with patient? Yes  SHORT TERM GOALS: Target date: 01/22/23  Pt will coordinate vocal and articulatory subsystems in hierarchical speech tasks by producing sounds with intention with min assistance.  Baseline: mi/mod Goal status:  INITIAL  2.  Generalize intentional speech to cognitive-linguistic exercises and conversational speech with improved vocal quality, loudness, articulatory precision, and endurance while maintaining a minimum of 85 dB with min assistance. Baseline: ~78 dB Goal status: INITIAL  3.  Read phrases, sentences, and paragraphs with intention, yielding improved vocal quality, loudness, articulatory precision, and endurance while maintaining a minimum of 85 dB with min assistance.  Baseline: ~69 dB Goal status: INITIAL  4.  Patient will maintain a maximum phonation duration of 10+ seconds or greater with minimal cues during sustained phonation over 10 trials. Baseline: Met over one trial, reduced with additional repetitions Goal status: INITIAL  LONG  TERM GOALS: Target date: 02/26/2023  Pt will speak with intent during spontaneous 1:1 conversations with indirect cues. Baseline: mi/mod Goal status: INITIAL   ASSESSMENT:  CLINICAL IMPRESSION: Patient is an 82 y.o. female who was seen today for a speech evaluation. 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 She demonstrates adequate vocal intensity ~75% of the time, but has difficulty with pacing and breath support which is negatively impacted by dyskinesia. Pt participated in SLP therapy in the past and is familiar with increasing vocal intensity to improve speech. Pt with reduced vocal intensity during oral reading and spontaneous conversation (~69 dB) and benefited from cues to use vocal intent and projection. Pt is a good candidate for SPEAK OUT! Therapy and is motivated to address her dysarthria.   OBJECTIVE IMPAIRMENTS: Objective impairments include dysarthria. These impairments are limiting patient from effectively communicating at home and in community.Factors affecting potential to achieve goals and functional outcome are medical prognosis.. Patient will benefit from skilled SLP services to address  above impairments and improve overall function.  REHAB POTENTIAL: Good  PLAN:  SLP FREQUENCY: 2x/week  SLP DURATION: other: for 4 weeks and then one additional visit 4-6 weeks after the 8th treatment session  PLANNED INTERVENTIONS: 92507 Treatment of speech (30 or 45 min) , Re-evaluation, Cueing hierachy, Internal/external aids, Functional tasks, and Multimodal communication approach   Thank you,  Havery Moros, CCC-SLP [phone]  Elmar Antigua, CCC-SLP 11/16/2022, 6:27 PM

## 2022-11-22 ENCOUNTER — Ambulatory Visit (INDEPENDENT_AMBULATORY_CARE_PROVIDER_SITE_OTHER): Payer: Medicare Other

## 2022-11-22 VITALS — Ht 63.0 in | Wt 106.0 lb

## 2022-11-22 DIAGNOSIS — Z0000 Encounter for general adult medical examination without abnormal findings: Secondary | ICD-10-CM | POA: Diagnosis not present

## 2022-11-22 NOTE — Patient Instructions (Signed)
Ms. Thune , Thank you for taking time to come for your Medicare Wellness Visit. I appreciate your ongoing commitment to your health goals. Please review the following plan we discussed and let me know if I can assist you in the future.   Referrals/Orders/Follow-Ups/Clinician Recommendations: Aim for 30 minutes of exercise or brisk walking, 6-8 glasses of water, and 5 servings of fruits and vegetables each day.   This is a list of the screening recommended for you and due dates:  Health Maintenance  Topic Date Due   Flu Shot  08/26/2022   COVID-19 Vaccine (6 - 2023-24 season) 09/26/2022   Medicare Annual Wellness Visit  11/22/2023   DTaP/Tdap/Td vaccine (2 - Tdap) 01/25/2029   Pneumonia Vaccine  Completed   DEXA scan (bone density measurement)  Completed   Zoster (Shingles) Vaccine  Completed   HPV Vaccine  Aged Out    Advanced directives: (Copy Requested) Please bring a copy of your health care power of attorney and living will to the office to be added to your chart at your convenience.  Next Medicare Annual Wellness Visit scheduled for next year: Yes  Insert Preventive Care attachment Insert FALL PREVENTION attachment if needed

## 2022-11-22 NOTE — Progress Notes (Signed)
Subjective:   [name] is a 82 y.o. female who presents for Medicare Annual (Subsequent) preventive examination.  Visit Complete: Virtual I connected with  [name] on 11/22/22 by a audio enabled telemedicine application and verified that I am speaking with the correct person using two identifiers.  Patient Location: Home  Provider Location: Home Office  I discussed the limitations of evaluation and management by telemedicine. The patient expressed understanding and agreed to proceed.  Vital Signs: Because this visit was a virtual/telehealth visit, some criteria may be missing or patient reported. Any vitals not documented were not able to be obtained and vitals that have been documented are patient reported.  Patient Medicare AWV questionnaire was completed by the patient on 11/22/2022; I have confirmed that all information answered by patient is correct and no changes since this date.  Cardiac Risk Factors include: advanced age (>33men, >20 women);dyslipidemia;hypertension     Objective:    Today's Vitals   11/22/22 1334  Weight: 106 lb (48.1 kg)  Height: 5\' 3"  (1.6 m)   Body mass index is 18.78 kg/m.     11/22/2022    1:37 PM 11/04/2022    3:13 PM 11/01/2022    7:56 AM 04/22/2022    7:55 AM 11/11/2021   10:17 AM 10/19/2021    7:54 AM 03/24/2021   10:56 AM  Advanced Directives  Does Patient Have a Medical Advance Directive? Yes No No Yes No Yes No  Type of Estate agent of [place];Living will   Living will     Copy of Healthcare Power of Attorney in Chart? No - copy requested        Would patient like information on creating a medical advance directive?  No - Patient declined   No - Patient declined      Current Medications (verified) Outpatient Encounter Medications as of 11/22/2022  Medication Sig   amantadine (SYMMETREL) 100 MG capsule TAKE ONE CAPSULE BY MOUTH TWICE DAILY AT 7am AND 1pm   b complex vitamins tablet Take 1  tablet by mouth daily.   Calcium 1500 MG tablet Take 1,500 mg by mouth.   Cholecalciferol 5000 units TABS Take 1 tablet (5,000 Units total) by mouth daily.   clonazePAM (KLONOPIN) 0.5 MG tablet TAKE 1/2 TABLET BY MOUTH AT BEDTIME   Denosumab (PROLIA [place]) Inject into the skin every 6 (six) months.   Elastic Bandages & Supports (ABDOMINAL BINDER/ELASTIC SMALL) MISC 1 Device by Does not apply route daily.   methocarbamol (ROBAXIN) 500 MG tablet TAKE 1 TABLET EVERY 8 HOURS AS NEEDED FOR MUSCLE SPASM   Multiple Vitamin (MULTIVITAMIN) capsule 2 tabs po qd   Omega-3 Fatty Acids (OMEGA 3 PO) 1 tab po qd   omeprazole (PRILOSEC) 20 MG capsule TAKE (1) CAPSULE BY MOUTH ONCE DAILY.   OnabotulinumtoxinA (BOTOX IM) Inject into the muscle every 3 (three) months. Injection every 3 months   venlafaxine XR (EFFEXOR-XR) 37.5 MG 24 hr capsule Take 3 capsules (112.5 mg total) by mouth daily with breakfast.   vitamin B-12 (CYANOCOBALAMIN) 500 MCG tablet Take 2500 mcg daily.   Facility-Administered Encounter Medications as of 11/22/2022  Medication   incobotulinumtoxinA (XEOMIN) 100 units injection 100 Units   incobotulinumtoxinA (XEOMIN) 100 units injection 100 Units    Allergies (verified) Penicillins   History: Past Medical History:  Diagnosis Date   Allergy    SEASONAL   Anemia    Anemia-NOS / PMH, Dr Arbutus Ped   Arthritis  Breast cancer (HCC)    stage I right breast   Cataract    BILATERAL   Decreased vision    R eye   Esophageal Stricture    Hyperlipidemia    Monoclonal gammopathy    MVP (mitral valve prolapse)    Osteoporosis    Parkinson disease (HCC)    Skin cancer    basal cell L neck   Vertical diplopia    Past Surgical History:  Procedure Laterality Date   APPENDECTOMY  2002   BONE BIOPSY  11/06/10   BREAST LUMPECTOMY  02/2010   right breast lumpectomy and sentinel node biopsy   CARDIAC CATHETERIZATION  2005   neg   CATARACT EXTRACTION, BILATERAL  2018   COLONOSCOPY      negative    HEMORRHOID SURGERY  1970's   SALPINGOOPHORECTOMY  2002   for benign growths   SHOULDER SURGERY Right    TOTAL ABDOMINAL HYSTERECTOMY W/ BILATERAL SALPINGOOPHORECTOMY  82 yrs old   for pain,prolapse ; G3 P2   UPPER GASTROINTESTINAL ENDOSCOPY     Family History  Problem Relation Age of Onset   Diabetes Mother    Heart disease Mother    Diabetes Brother    Heart disease Brother    Hepatitis Father    Diabetes Brother    Diabetes Brother    Breast cancer Neg Hx    Colon cancer Neg Hx    Social History   Socioeconomic History   Marital status: Widowed    Spouse name: Not on file   Number of children: 2   Years of education: Not on file   Highest education level: Not on file  Occupational History   Occupation: Retired  Tobacco Use   Smoking status: Never   Smokeless tobacco: Never  Vaping Use   Vaping status: Never Used  Substance and Sexual Activity   Alcohol use: Not Currently    Alcohol/week: 1.0 standard drink of alcohol    Types: 1 Glasses of wine per week   Drug use: No   Sexual activity: Not Currently  Other Topics Concern   Not on file  Social History Narrative   Occupation: Textile   Widowed 2016, husband had cancer, was married in 1959    Patient has never smoked.    Illicit Drug Use - no   Patient does not get regular exercise.    Daily Caffeine Use: 1 cup of coffee daily   Right Handed   Lives in a one story home   Social Determinants of Health   Financial Resource Strain: Low Risk  (11/22/2022)   Overall Financial Resource Strain (CARDIA)    Difficulty of Paying Living Expenses: Not hard at all  Food Insecurity: No Food Insecurity (11/22/2022)   Hunger Vital Sign    Worried About Running Out of Food in the Last Year: Never true    Ran Out of Food in the Last Year: Never true  Transportation Needs: No Transportation Needs (11/22/2022)   PRAPARE - Administrator, Civil Service (Medical): No    Lack of Transportation  (Non-Medical): No  Physical Activity: Inactive (11/22/2022)   Exercise Vital Sign    Days of Exercise per Week: 0 days    Minutes of Exercise per Session: 0 min  Stress: No Stress Concern Present (11/22/2022)   [employer] of Occupational Health - Occupational Stress Questionnaire    Feeling of Stress : Not at all  Social Connections: Socially Isolated (11/22/2022)   Social  Connection and Isolation Panel [NHANES]    Frequency of Communication with Friends and Family: More than three times a week    Frequency of Social Gatherings with Friends and Family: More than three times a week    Attends Religious Services: Never    Database administrator or Organizations: No    Attends Banker Meetings: Never    Marital Status: Widowed    Tobacco Counseling Counseling given: Not Answered   Clinical Intake:  Pre-visit preparation completed: Yes  Pain : No/denies pain     Nutritional Risks: None Diabetes: No  How often do you need to have someone help you when you read instructions, pamphlets, or other written materials from your doctor or pharmacy?: 1 - Never  Interpreter Needed?: No  Information entered by :: Renie Ora, LPN   Activities of Daily Living    11/22/2022    1:37 PM  In your present state of health, do you have any difficulty performing the following activities:  Hearing? 0  Vision? 0  Difficulty concentrating or making decisions? 0  Walking or climbing stairs? 0  Dressing or bathing? 0  Doing errands, shopping? 0  Preparing Food and eating ? N  Using the Toilet? N  In the past six months, have you accidently leaked urine? N  Do you have problems with loss of bowel control? N  Managing your Medications? N  Managing your Finances? N  Housekeeping or managing your Housekeeping? N    Patient Care Team: Joaquim Nam, MD as PCP - General (Family Medicine) Tat, Octaviano Batty, DO as Consulting Physician (Neurology) Elwin Mocha,  MD as Consulting Physician (Ophthalmology)  Indicate any recent Medical Services you may have received from other than [facility] providers in the past year (date may be approximate).     Assessment:   This is a routine wellness examination for Beautiful.  Hearing/Vision screen Vision Screening - Comments:: Wears rx glasses - up to date with routine eye exams with Dr. Glynis Smiles   Goals Addressed             This Visit's Progress    DIET - EAT MORE FRUITS AND VEGETABLES         Depression Screen    11/22/2022    1:36 PM 11/11/2021   10:11 AM 11/03/2020   11:00 AM 01/29/2019    2:03 PM 01/16/2018   11:09 AM 01/13/2017   11:36 AM 10/17/2015   10:58 AM  PHQ 2/9 Scores  PHQ - 2 Score 0 0 0 0 0 0 0  PHQ- 9 Score  2 0 0 0 0     Fall Risk    11/22/2022    1:35 PM 11/01/2022    7:55 AM 04/22/2022    7:55 AM 11/11/2021   10:17 AM 10/19/2021    7:54 AM  Fall Risk   Falls in the past year? 1 1 1  0 0  Number falls in past yr: 1 1 0 0 0  Injury with Fall? 1 0 0 0 0  Risk for fall due to : History of fall(s);Impaired balance/gait;Orthopedic patient   Impaired balance/gait   Follow up Education provided;Falls prevention discussed;Falls evaluation completed Falls evaluation completed Falls evaluation completed Falls evaluation completed;Education provided;Falls prevention discussed     MEDICARE RISK AT HOME: Medicare Risk at Home Any stairs in or around the home?: Yes If so, are there any without handrails?: No Home free of loose throw rugs in walkways, pet beds,  electrical cords, etc?: Yes Adequate lighting in your home to reduce risk of falls?: Yes Life alert?: No Use of a cane, walker or w/c?: No Grab bars in the bathroom?: Yes Shower chair or bench in shower?: Yes Elevated toilet seat or a handicapped toilet?: Yes  TIMED UP AND GO:  Was the test performed?  No    Cognitive Function:    01/29/2019    2:05 PM 01/16/2018   11:08 AM 01/13/2017   11:39 AM  MMSE - Mini Mental State  Exam  Orientation to time 5 5 5   Orientation to Place 5 5 5   Registration 3 3 3   Attention/ Calculation 5 0 0  Recall 3 3 3   Language- name 2 objects  0 0  Language- repeat 1 1 1   Language- follow 3 step command  3 3  Language- read & follow direction  0 0  Write a sentence  0 0  Copy design  0 0  Total score  20 20        11/22/2022    1:38 PM 11/11/2021   10:09 AM  6CIT Screen  What Year? 0 points 0 points  What month? 0 points 0 points  What time? 0 points 0 points  Count back from 20 0 points 0 points  Months in reverse 0 points 0 points  Repeat phrase 0 points 0 points  Total Score 0 points 0 points    Immunizations Immunization History  Administered Date(s) Administered   Fluad Quad(high Dose 65+) 10/09/2018   Influenza Split 01/06/2011, 12/01/2011   Influenza Whole 02/21/2009   Influenza, High Dose Seasonal PF 10/25/2021   Influenza,inj,Quad PF,6+ Mos 10/31/2012, 10/08/2013, 10/08/2014, 09/26/2015, 10/06/2016, 10/03/2017   Influenza,inj,quad, With Preservative 09/27/2016, 10/09/2018   Influenza-Unspecified 12/24/2019, 10/22/2020   Moderna Covid-19 Vaccine Bivalent Booster 52yrs & up 10/22/2020   Moderna Sars-Covid-2 Vaccination 02/09/2019, 03/09/2019, 11/19/2019, 06/20/2020   Pneumococcal Conjugate-13 07/18/2014   Pneumococcal Polysaccharide-23 03/04/2011   Td 01/26/2019   Zoster Recombinant(Shingrix) 10/08/2017, 12/07/2017   Zoster, Live 10/26/2012    TDAP status: Up to date  Flu Vaccine status: Due, Education has been provided regarding the importance of this vaccine. Advised may receive this vaccine at local pharmacy or Health Dept. Aware to provide a copy of the vaccination record if obtained from local pharmacy or Health Dept. Verbalized acceptance and understanding.  Pneumococcal vaccine status: Up to date  Covid-19 vaccine status: Completed vaccines  Qualifies for Shingles Vaccine? Yes   Zostavax completed Yes   Shingrix Completed?:  Yes  Screening Tests Health Maintenance  Topic Date Due   INFLUENZA VACCINE  08/26/2022   COVID-19 Vaccine (6 - 2023-24 season) 09/26/2022   Medicare Annual Wellness (AWV)  11/22/2023   DTaP/Tdap/Td (2 - Tdap) 01/25/2029   Pneumonia Vaccine 41+ Years old  Completed   DEXA SCAN  Completed   Zoster Vaccines- Shingrix  Completed   HPV VACCINES  Aged Out    Health Maintenance  Health Maintenance Due  Topic Date Due   INFLUENZA VACCINE  08/26/2022   COVID-19 Vaccine (6 - 2023-24 season) 09/26/2022    Colorectal cancer screening: No longer required.   Mammogram status: No longer required due to age.  Bone Density status: Ordered declined . Pt provided with contact info and advised to call to schedule appt.  Lung Cancer Screening: (Low Dose CT Chest recommended if Age 71-80 years, 20 pack-year currently smoking OR have quit w/in 15years.) does not qualify.   Lung Cancer Screening Referral: n/a  Additional Screening:  Hepatitis C Screening: does not qualify;  Vision Screening: Recommended annual ophthalmology exams for early detection of glaucoma and other disorders of the eye. Is the patient up to date with their annual eye exam?  Yes  Who is the provider or what is the name of the office in which the patient attends annual eye exams? Dr.Sha  If pt is not established with a provider, would they like to be referred to a provider to establish care? No .   Dental Screening: Recommended annual dental exams for proper oral hygiene   Community Resource Referral / Chronic Care Management: CRR required this visit?  No   CCM required this visit?  No     Plan:     I have personally reviewed and noted the following in the patient's chart:   Medical and social history Use of alcohol, tobacco or illicit drugs  Current medications and supplements including opioid prescriptions. Patient is not currently taking opioid prescriptions. Functional ability and status Nutritional  status Physical activity Advanced directives List of other physicians Hospitalizations, surgeries, and ER visits in previous 12 months Vitals Screenings to include cognitive, depression, and falls Referrals and appointments  In addition, I have reviewed and discussed with patient certain preventive protocols, quality metrics, and best practice recommendations. A written personalized care plan for preventive services as well as general preventive health recommendations were provided to patient.     Lorrene Reid, LPN   16/10/9602   After Visit Summary: (MyChart) Due to this being a telephonic visit, the after visit summary with patients personalized plan was offered to patient via MyChart   Nurse Notes: none

## 2022-11-23 ENCOUNTER — Ambulatory Visit (HOSPITAL_COMMUNITY): Payer: Medicare Other | Admitting: Physical Therapy

## 2022-11-23 DIAGNOSIS — M6281 Muscle weakness (generalized): Secondary | ICD-10-CM

## 2022-11-23 DIAGNOSIS — G20B2 Parkinson's disease with dyskinesia, with fluctuations: Secondary | ICD-10-CM | POA: Diagnosis not present

## 2022-11-23 DIAGNOSIS — Z9181 History of falling: Secondary | ICD-10-CM

## 2022-11-23 DIAGNOSIS — R490 Dysphonia: Secondary | ICD-10-CM | POA: Diagnosis not present

## 2022-11-23 DIAGNOSIS — R262 Difficulty in walking, not elsewhere classified: Secondary | ICD-10-CM | POA: Diagnosis not present

## 2022-11-23 DIAGNOSIS — G20A1 Parkinson's disease without dyskinesia, without mention of fluctuations: Secondary | ICD-10-CM | POA: Diagnosis not present

## 2022-11-23 NOTE — Therapy (Signed)
OUTPATIENT PHYSICAL THERAPY TREATMENT   Patient Name: [name] MRN: [id number] DOB:1940/06/09, 82 y.o., female Today's Date: 11/23/2022  END OF SESSION:  PT End of Session - 11/23/22 1428     Visit Number 4    Number of Visits 12    Date for PT Re-Evaluation 12/16/22    Authorization Type medicare, BCBS Supplement    Progress Note Due on Visit 10    PT Start Time 1345    PT Stop Time 1425    PT Time Calculation (min) 40 min    Equipment Utilized During Treatment Gait belt    Activity Tolerance Patient tolerated treatment well;No increased pain    Behavior During Therapy WFL for tasks assessed/performed              Past Medical History:  Diagnosis Date   Allergy    SEASONAL   Anemia    Anemia-NOS / PMH, Dr Arbutus Ped   Arthritis    Breast cancer [facility])    stage I right breast   Cataract    BILATERAL   Decreased vision    R eye   Esophageal Stricture    Hyperlipidemia    Monoclonal gammopathy    MVP (mitral valve prolapse)    Osteoporosis    Parkinson disease (HCC)    Skin cancer    basal cell L neck   Vertical diplopia    Past Surgical History:  Procedure Laterality Date   APPENDECTOMY  2002   BONE BIOPSY  11/06/10   BREAST LUMPECTOMY  02/2010   right breast lumpectomy and sentinel node biopsy   CARDIAC CATHETERIZATION  2005   neg   CATARACT EXTRACTION, BILATERAL  2018   COLONOSCOPY     negative    HEMORRHOID SURGERY  1970's   SALPINGOOPHORECTOMY  2002   for benign growths   SHOULDER SURGERY Right    TOTAL ABDOMINAL HYSTERECTOMY W/ BILATERAL SALPINGOOPHORECTOMY  82 yrs old   for pain,prolapse ; G3 P2   UPPER GASTROINTESTINAL ENDOSCOPY     Patient Active Problem List   Diagnosis Date Noted   Black stools 02/27/2020   Healthcare maintenance 02/05/2019   Mitral valve prolapse 12/14/2018   Pain in right knee 11/28/2018   Livedo reticularis 09/28/2018   Abdominal pain 02/01/2018   Low back pain 12/29/2017   Dysuria 12/29/2017   Edema  11/13/2017   Right arm pain 02/03/2017   Recurrent falls 02/03/2017   Advance care planning 01/23/2017   Hematuria 07/20/2016   Headache 05/11/2015   Globus sensation 04/24/2015   Foot pain 02/25/2015   Shoulder pain 04/17/2014   Parkinson's disease (HCC) 06/05/2013   Medicare annual wellness visit, subsequent 05/02/2012   Neck pain 05/02/2012   Osteoporosis 03/08/2011   Breast cancer (HCC) 11/19/2010   GERD (gastroesophageal reflux disease) 08/01/2008   DYSPHAGIA UNSPECIFIED 08/01/2008   ORTHOSTATIC HYPOTENSION 06/28/2008   HLD (hyperlipidemia) 07/10/2007   MONOCLONAL GAMMOPATHY 07/10/2007   ANEMIA-NOS 07/10/2007   FATIGUE 07/10/2007    PCP: Crawford Givens  REFERRING PROVIDER: Vladimir Faster, DO  REFERRING DIAG: Parkinson   THERAPY DIAG:  Difficulty in walking, not elsewhere classified  Hypokinetic Parkinsonian dysphonia (HCC)  Muscle weakness (generalized)  History of falling  Rationale for Evaluation and Treatment: Rehabilitation  ONSET DATE: 01/26/2022  SUBJECTIVE:   SUBJECTIVE STATEMENT: Pt states that she has been trying to do her exercises.  PERTINENT HISTORY: Ms. Savary states that she is falling to much.  She has fallen at least 8 times  since April.  She has been using a cane for while.  She also has a walker.  PT states that she has had home health before but did not feel that this helped much. PMH: Parkinson's, OA .  PAIN:  Are you having pain? Back we are not focusing on this, limited activity to not aggravate pt back   PRECAUTIONS: Fall WEIGHT BEARING RESTRICTIONS: No  FALLS:  Has patient fallen in last 6 months? Yes. Number of falls 8  PLOF: Independent with basic ADLs  PATIENT GOALS: to stop falling as much   NEXT MD VISIT: not sure  OBJECTIVE:  Note: Objective measures were completed at Evaluation unless otherwise noted.   COGNITION: Overall cognitive status: Within functional limits for tasks assessed     SENSATION: WFL   deg  POSTURE: rounded shoulders, decreased lumbar lordosis, and decreased thoracic kyphosis  LOWER EXTREMITY MMT:  MMT Right eval Left eval  Hip flexion 5 5  Hip extension 3 3  Hip abduction 4+  3+  Knee flexion 3+ 3+  Knee extension 4+ 5  Ankle dorsiflexion 3+ 3+   FUNCTIONAL TESTS:  30 seconds chair stand test:  2 x in 30"  2 minute walk test: with cane 292 ft with knees bend and decreased ankle motion  Single leg stance:  RT: 4"; Lt 55"   TODAY'S TREATMENT:                                                                                                                              DATE:   11/13/22 Gait train with quad cane for proper technique.   Supine: PWR up x 10 x 2sets; prone PWR up x 2 sets ; standing PWR up x 2 sets  Big walk forward, backward and side stepping x 2  11/11/22: -PWR! Moves up x10, twist x10 bilat  -seated bouncy ball rebounding off wall (about 7' target) x15, then 15x seated on wobble disc (excellent explosive reaction timing catch catching)   -standing hips to wall, trunk extension pillow crush isometric 15x3secH   -AMB high intensity intervals, max speed 6x 76ft c 3lb AW bilat (~1.42m/s average for final 3)   -44ft retroAMB c 3lb AW bilat, NO DEVICE, mingaurd to total A for LOB; beter control when cued to maintain slight forward lean and decrease gait speed -40ft lateral stepping c 3lb AW bilat bilat no device (no frontal plane balance issues)   PATIENT EDUCATION:  Education details: HEP as below as well as PWR! in sitting position Person educated: Patient Education method: Explanation, Verbal cues, and Handouts Education comprehension: verbalized understanding and returned demonstration  HOME EXERCISE PROGRAM: Access Code: [email or web address] URL: [email or web address] Date: 11/04/2022 Prepared by: Virgina Organ  Exercises - Supine Bridge  - 2 x daily - 7 x weekly - 1 sets - 10 reps - 5 seconds hold - Supine Lower Trunk Rotation  -  2 x daily - 7 x weekly - 1  sets - 10 reps - 3 seconds  hold - Sit to Stand with Armchair  - 2 x daily - 7 x weekly - 1 sets - 10 reps - Prone Hip Extension  - 1 x daily - 7 x weekly - 2 sets - 10 reps - 3 seconds  hold  ASSESSMENT:  CLINICAL IMPRESSION: Continued to work on [employer] for improved mobility, strength and POWER.  Completed Power motions with music to improve cadence.  Pt will continue to benefit from skilled PT to improve her strength, balance and power to decrease her falls.    OBJECTIVE IMPAIRMENTS: Abnormal gait, decreased activity tolerance, decreased balance, decreased mobility, decreased strength, and increased fascial restrictions.   ACTIVITY LIMITATIONS: carrying, standing, and locomotion level  PARTICIPATION LIMITATIONS: shopping and community activity  REAB POTENTIAL: Good  CLINICAL DECISION MAKING: Evolving/moderate complexity  EVALUATION COMPLEXITY: Moderate   GOALS: Goals reviewed with patient? No  SHORT TERM GOALS: Target date: 11/25/22 PT to be I in HEP in order to improve strength and balance to have not fallen in the past two weeks  Baseline: Goal status:  on-going   2.  PT to be able to complete 5 sit to stand in a 30 second period.   Baseline:  Goal status:  on-going   3.  PT to be able to balance on her left leg for 10 seconds to reduce risk of falling  Baseline:  Goal status:  on-going    LONG TERM GOALS: Target date: 12/17/22  PT to be I in an advanced HEP in order to have not had a fall in the past five weeks.  Baseline:  Goal status: on-going   2.  PT LE strength to be increased by one grade to allow pt to come sit to stand from a couch without difficulty  Baseline:  Goal status:  on-going  3.  Pt to be able to single leg stance on her Lt leg for 20 seconds to decrease risk of falling  Baseline:  Goal status:  on-going    PLAN:  PT FREQUENCY: 2x/week  PT DURATION: 6 weeks  PLANNED INTERVENTIONS: 97146- PT Re-evaluation,  97110-Therapeutic exercises, 97530- Therapeutic activity, [id number]- Neuromuscular re-education, 97535- Self Care, and Patient/Family education  PLAN FOR NEXT SESSION: Attempt kneeling PWR moves.  Virgina Organ, PT CLT [phone]

## 2022-11-25 ENCOUNTER — Ambulatory Visit (HOSPITAL_COMMUNITY): Payer: Medicare Other

## 2022-11-25 ENCOUNTER — Encounter (HOSPITAL_COMMUNITY): Payer: Self-pay

## 2022-11-25 DIAGNOSIS — R490 Dysphonia: Secondary | ICD-10-CM | POA: Diagnosis not present

## 2022-11-25 DIAGNOSIS — Z9181 History of falling: Secondary | ICD-10-CM

## 2022-11-25 DIAGNOSIS — R262 Difficulty in walking, not elsewhere classified: Secondary | ICD-10-CM

## 2022-11-25 DIAGNOSIS — M6281 Muscle weakness (generalized): Secondary | ICD-10-CM

## 2022-11-25 DIAGNOSIS — G20A1 Parkinson's disease without dyskinesia, without mention of fluctuations: Secondary | ICD-10-CM | POA: Diagnosis not present

## 2022-11-25 DIAGNOSIS — G20B2 Parkinson's disease with dyskinesia, with fluctuations: Secondary | ICD-10-CM | POA: Diagnosis not present

## 2022-11-25 NOTE — Therapy (Signed)
OUTPATIENT PHYSICAL THERAPY TREATMENT   Patient Name: [name] MRN: [id number] DOB:1940/10/09, 82 y.o., female Today's Date: 11/25/2022  END OF SESSION:  PT End of Session - 11/25/22 1617     Visit Number 5    Number of Visits 12    Date for PT Re-Evaluation 12/16/22    Authorization Type medicare, BCBS Supplement    Progress Note Due on Visit 10    PT Start Time 1528    PT Stop Time 1608    PT Time Calculation (min) 40 min    Equipment Utilized During Treatment Gait belt    Activity Tolerance Patient tolerated treatment well;No increased pain    Behavior During Therapy WFL for tasks assessed/performed               Past Medical History:  Diagnosis Date   Allergy    SEASONAL   Anemia    Anemia-NOS / PMH, Dr Arbutus Ped   Arthritis    Breast cancer [facility])    stage I right breast   Cataract    BILATERAL   Decreased vision    R eye   Esophageal Stricture    Hyperlipidemia    Monoclonal gammopathy    MVP (mitral valve prolapse)    Osteoporosis    Parkinson disease (HCC)    Skin cancer    basal cell L neck   Vertical diplopia    Past Surgical History:  Procedure Laterality Date   APPENDECTOMY  2002   BONE BIOPSY  11/06/10   BREAST LUMPECTOMY  02/2010   right breast lumpectomy and sentinel node biopsy   CARDIAC CATHETERIZATION  2005   neg   CATARACT EXTRACTION, BILATERAL  2018   COLONOSCOPY     negative    HEMORRHOID SURGERY  1970's   SALPINGOOPHORECTOMY  2002   for benign growths   SHOULDER SURGERY Right    TOTAL ABDOMINAL HYSTERECTOMY W/ BILATERAL SALPINGOOPHORECTOMY  82 yrs old   for pain,prolapse ; G3 P2   UPPER GASTROINTESTINAL ENDOSCOPY     Patient Active Problem List   Diagnosis Date Noted   Black stools 02/27/2020   Healthcare maintenance 02/05/2019   Mitral valve prolapse 12/14/2018   Pain in right knee 11/28/2018   Livedo reticularis 09/28/2018   Abdominal pain 02/01/2018   Low back pain 12/29/2017   Dysuria 12/29/2017   Edema  11/13/2017   Right arm pain 02/03/2017   Recurrent falls 02/03/2017   Advance care planning 01/23/2017   Hematuria 07/20/2016   Headache 05/11/2015   Globus sensation 04/24/2015   Foot pain 02/25/2015   Shoulder pain 04/17/2014   Parkinson's disease (HCC) 06/05/2013   Medicare annual wellness visit, subsequent 05/02/2012   Neck pain 05/02/2012   Osteoporosis 03/08/2011   Breast cancer (HCC) 11/19/2010   GERD (gastroesophageal reflux disease) 08/01/2008   DYSPHAGIA UNSPECIFIED 08/01/2008   ORTHOSTATIC HYPOTENSION 06/28/2008   HLD (hyperlipidemia) 07/10/2007   MONOCLONAL GAMMOPATHY 07/10/2007   ANEMIA-NOS 07/10/2007   FATIGUE 07/10/2007    PCP: Crawford Givens  REFERRING PROVIDER: Vladimir Faster, DO  REFERRING DIAG: Parkinson   THERAPY DIAG:  Difficulty in walking, not elsewhere classified  Hypokinetic Parkinsonian dysphonia (HCC)  Muscle weakness (generalized)  History of falling  Rationale for Evaluation and Treatment: Rehabilitation  ONSET DATE: 01/26/2022  SUBJECTIVE:   SUBJECTIVE STATEMENT: Pt arrived ambulating with QC, no reports of recent fall since began therapy.  Exercises are going well at home.   PERTINENT HISTORY: Ms. Hawkey states that she is  falling to much.  She has fallen at least 8 times since April.  She has been using a cane for while.  She also has a walker.  PT states that she has had home health before but did not feel that this helped much. PMH: Parkinson's, OA .  PAIN:  Are you having pain? Back we are not focusing on this, limited activity to not aggravate pt back   PRECAUTIONS: Fall WEIGHT BEARING RESTRICTIONS: No  FALLS:  Has patient fallen in last 6 months? Yes. Number of falls 8  PLOF: Independent with basic ADLs  PATIENT GOALS: to stop falling as much   NEXT MD VISIT: not sure  OBJECTIVE:  Note: Objective measures were completed at Evaluation unless otherwise noted.   COGNITION: Overall cognitive status: Within functional  limits for tasks assessed     SENSATION: WFL  deg  POSTURE: rounded shoulders, decreased lumbar lordosis, and decreased thoracic kyphosis  LOWER EXTREMITY MMT:  MMT Right eval Left eval  Hip flexion 5 5  Hip extension 3 3  Hip abduction 4+  3+  Knee flexion 3+ 3+  Knee extension 4+ 5  Ankle dorsiflexion 3+ 3+   FUNCTIONAL TESTS:  30 seconds chair stand test:  2 x in 30"  2 minute walk test: with cane 292 ft with knees bend and decreased ankle motion  Single leg stance:  RT: 4"; Lt 55"   TODAY'S TREATMENT:                                                                                                                              DATE:  11/25/22: Quadruped: UE extension LE extensoin Thread needles Crawling 5RT front of mat   Standing: PWR Up! PWR! Rock PWR Twist!  Sidestep front of mat with RTB around thigh no HHA 5RT Diagonal forward and backward 5RT  11/13/22 Gait train with quad cane for proper technique.   Supine: PWR up x 10 x 2sets; prone PWR up x 2 sets ; standing PWR up x 2 sets  Big walk forward, backward and side stepping x 2  11/11/22: -PWR! Moves up x10, twist x10 bilat  -seated bouncy ball rebounding off wall (about 7' target) x15, then 15x seated on wobble disc (excellent explosive reaction timing catch catching)   -standing hips to wall, trunk extension pillow crush isometric 15x3secH   -AMB high intensity intervals, max speed 6x 14ft c 3lb AW bilat (~1.32m/s average for final 3)   -98ft retroAMB c 3lb AW bilat, NO DEVICE, mingaurd to total A for LOB; beter control when cued to maintain slight forward lean and decrease gait speed -47ft lateral stepping c 3lb AW bilat bilat no device (no frontal plane balance issues)   PATIENT EDUCATION:  Education details: HEP as below as well as PWR! in sitting position Person educated: Patient Education method: Explanation, Verbal cues, and Handouts Education comprehension: verbalized understanding and  returned demonstration  HOME EXERCISE PROGRAM: Access  Code: [email or web address] URL: [email or web address] Date: 11/04/2022 Prepared by: Virgina Organ  Exercises - Supine Bridge  - 2 x daily - 7 x weekly - 1 sets - 10 reps - 5 seconds hold - Supine Lower Trunk Rotation  - 2 x daily - 7 x weekly - 1 sets - 10 reps - 3 seconds  hold - Sit to Stand with Armchair  - 2 x daily - 7 x weekly - 1 sets - 10 reps - Prone Hip Extension  - 1 x daily - 7 x weekly - 2 sets - 10 reps - 3 seconds  hold  ASSESSMENT:  CLINICAL IMPRESSION:  Added PWR quadruped exercises for core and proximal strengthening that was tolerated well.  Pt able to complete all exercise no episodes of freezing.  Noted she is lacking TKE Bil knees.  Added LAQ for quad strengthening and educated pt on heel to toe mechanics.  Presents with good mechanics with QC sequence.    OBJECTIVE IMPAIRMENTS: Abnormal gait, decreased activity tolerance, decreased balance, decreased mobility, decreased strength, and increased fascial restrictions.   ACTIVITY LIMITATIONS: carrying, standing, and locomotion level  PARTICIPATION LIMITATIONS: shopping and community activity  REAB POTENTIAL: Good  CLINICAL DECISION MAKING: Evolving/moderate complexity  EVALUATION COMPLEXITY: Moderate   GOALS: Goals reviewed with patient? No  SHORT TERM GOALS: Target date: 11/25/22 PT to be I in HEP in order to improve strength and balance to have not fallen in the past two weeks  Baseline: Goal status:  on-going   2.  PT to be able to complete 5 sit to stand in a 30 second period.   Baseline:  Goal status:  on-going   3.  PT to be able to balance on her left leg for 10 seconds to reduce risk of falling  Baseline:  Goal status:  on-going    LONG TERM GOALS: Target date: 12/17/22  PT to be I in an advanced HEP in order to have not had a fall in the past five weeks.  Baseline:  Goal status: on-going   2.  PT LE strength to be increased by  one grade to allow pt to come sit to stand from a couch without difficulty  Baseline:  Goal status:  on-going  3.  Pt to be able to single leg stance on her Lt leg for 20 seconds to decrease risk of falling  Baseline:  Goal status:  on-going    PLAN:  PT FREQUENCY: 2x/week  PT DURATION: 6 weeks  PLANNED INTERVENTIONS: 97146- PT Re-evaluation, 97110-Therapeutic exercises, 97530- Therapeutic activity, [id number]- Neuromuscular re-education, 97535- Self Care, and Patient/Family education  PLAN FOR NEXT SESSION: Continue PWR! Activities, balance and gait training.    Becky Sax, LPTA/CLT; CBIS [phone]  Juel Burrow, PTA 11/25/2022, 4:18 PM

## 2022-11-26 DIAGNOSIS — Z23 Encounter for immunization: Secondary | ICD-10-CM | POA: Diagnosis not present

## 2022-11-28 NOTE — Progress Notes (Unsigned)
    Megan Jolly T. [name], MD, CAQ Sports Medicine [facility] at [facility] [address] [place], [address]  Phone: [phone]  FAX: [phone]  [name] - 82 y.o. female  MRN [id number]  Date of Birth: 03/17/1940  Date: 11/29/2022  PCP: Joaquim Nam, MD  Referral: Joaquim Nam, MD  No chief complaint on file.  Subjective:   [name] is a 82 y.o. very pleasant female patient with There is no height or weight on file to calculate BMI. who presents with the following:  Patient presents with ongoing leg and knee pain.  She did see my partner Dr. Para March roughly 1 year ago with some right-sided knee pain.    Review of Systems is noted in the HPI, as appropriate  Objective:   There were no vitals taken for this visit.  GEN: No acute distress; alert,appropriate. PULM: Breathing comfortably in no respiratory distress PSYCH: Normally interactive.   Laboratory and Imaging Data:  Assessment and Plan:   ***

## 2022-11-29 ENCOUNTER — Ambulatory Visit (INDEPENDENT_AMBULATORY_CARE_PROVIDER_SITE_OTHER): Payer: Medicare Other | Admitting: Family Medicine

## 2022-11-29 ENCOUNTER — Encounter: Payer: Self-pay | Admitting: Family Medicine

## 2022-11-29 ENCOUNTER — Other Ambulatory Visit: Payer: Self-pay | Admitting: Family Medicine

## 2022-11-29 ENCOUNTER — Ambulatory Visit (INDEPENDENT_AMBULATORY_CARE_PROVIDER_SITE_OTHER)
Admission: RE | Admit: 2022-11-29 | Discharge: 2022-11-29 | Disposition: A | Discharge status: Home / Self Care | Payer: Medicare Other | Source: Ambulatory Visit | Attending: Family Medicine | Admitting: Family Medicine

## 2022-11-29 VITALS — BP 160/90 | HR 80 | Temp 99.0°F | Ht 63.0 in | Wt 112.2 lb

## 2022-11-29 DIAGNOSIS — M25561 Pain in right knee: Secondary | ICD-10-CM

## 2022-11-29 DIAGNOSIS — G8929 Other chronic pain: Secondary | ICD-10-CM

## 2022-11-29 DIAGNOSIS — M1711 Unilateral primary osteoarthritis, right knee: Secondary | ICD-10-CM | POA: Diagnosis not present

## 2022-11-29 MED ORDER — CELECOXIB 200 MG PO CAPS: 200.0000 mg | ORAL_CAPSULE | Freq: Every day | ORAL | 0 refills | Status: DC

## 2022-11-29 MED ORDER — TRIAMCINOLONE ACETONIDE 40 MG/ML IJ SUSP
40.0000 mg | Freq: Once | INTRAMUSCULAR | Status: AC
  Administered 2022-11-29: 40 mg via INTRA_ARTICULAR

## 2022-11-30 ENCOUNTER — Ambulatory Visit (HOSPITAL_COMMUNITY): Payer: Medicare Other | Attending: Neurology

## 2022-11-30 ENCOUNTER — Encounter (HOSPITAL_COMMUNITY): Payer: Self-pay

## 2022-11-30 DIAGNOSIS — R471 Dysarthria and anarthria: Secondary | ICD-10-CM | POA: Insufficient documentation

## 2022-11-30 DIAGNOSIS — M6281 Muscle weakness (generalized): Secondary | ICD-10-CM | POA: Insufficient documentation

## 2022-11-30 DIAGNOSIS — R490 Dysphonia: Secondary | ICD-10-CM | POA: Diagnosis not present

## 2022-11-30 DIAGNOSIS — G20A1 Parkinson's disease without dyskinesia, without mention of fluctuations: Secondary | ICD-10-CM | POA: Insufficient documentation

## 2022-11-30 DIAGNOSIS — R262 Difficulty in walking, not elsewhere classified: Secondary | ICD-10-CM | POA: Insufficient documentation

## 2022-11-30 DIAGNOSIS — Z9181 History of falling: Secondary | ICD-10-CM | POA: Insufficient documentation

## 2022-11-30 NOTE — Therapy (Signed)
OUTPATIENT PHYSICAL THERAPY TREATMENT   Patient Name: [name] MRN: [id number] DOB:[birth year], 82 y.o., female Today's Date: 11/30/2022  END OF SESSION:  PT End of Session - 11/30/22 1031     Visit Number 6    Number of Visits 12    Date for PT Re-Evaluation 12/16/22    Authorization Type medicare, BCBS Supplement    Progress Note Due on Visit 10    PT Start Time [phone]    PT Stop Time 1027    PT Time Calculation (min) 40 min    Equipment Utilized During Treatment Gait belt    Activity Tolerance Patient tolerated treatment well;No increased pain    Behavior During Therapy WFL for tasks assessed/performed                Past Medical History:  Diagnosis Date   Allergy    SEASONAL   Anemia    Anemia-NOS / PMH, Dr Arbutus Ped   Arthritis    Breast cancer [facility])    stage I right breast   Cataract    BILATERAL   Decreased vision    R eye   Esophageal Stricture    Hyperlipidemia    Monoclonal gammopathy    MVP (mitral valve prolapse)    Osteoporosis    Parkinson disease (HCC)    Skin cancer    basal cell L neck   Vertical diplopia    Past Surgical History:  Procedure Laterality Date   APPENDECTOMY  2002   BONE BIOPSY  11/06/10   BREAST LUMPECTOMY  02/2010   right breast lumpectomy and sentinel node biopsy   CARDIAC CATHETERIZATION  2005   neg   CATARACT EXTRACTION, BILATERAL  2018   COLONOSCOPY     negative    HEMORRHOID SURGERY  1970's   SALPINGOOPHORECTOMY  2002   for benign growths   SHOULDER SURGERY Right    TOTAL ABDOMINAL HYSTERECTOMY W/ BILATERAL SALPINGOOPHORECTOMY  82 yrs old   for pain,prolapse ; G3 P2   UPPER GASTROINTESTINAL ENDOSCOPY     Patient Active Problem List   Diagnosis Date Noted   Black stools 02/27/2020   Healthcare maintenance 02/05/2019   Mitral valve prolapse 12/14/2018   Pain in right knee 11/28/2018   Livedo reticularis 09/28/2018   Abdominal pain 02/01/2018   Low back pain 12/29/2017   Edema 11/13/2017   Right  arm pain 02/03/2017   Recurrent falls 02/03/2017   Advance care planning 01/23/2017   Hematuria 07/20/2016   Headache 05/11/2015   Globus sensation 04/24/2015   Shoulder pain 04/17/2014   Parkinson's disease (HCC) 06/05/2013   Medicare annual wellness visit, subsequent 05/02/2012   Neck pain 05/02/2012   Osteoporosis 03/08/2011   Breast cancer (HCC) 11/19/2010   GERD (gastroesophageal reflux disease) 08/01/2008   DYSPHAGIA UNSPECIFIED 08/01/2008   ORTHOSTATIC HYPOTENSION 06/28/2008   HLD (hyperlipidemia) 07/10/2007   MONOCLONAL GAMMOPATHY 07/10/2007   ANEMIA-NOS 07/10/2007   FATIGUE 07/10/2007    PCP: Crawford Givens  REFERRING PROVIDER: Vladimir Faster, DO  REFERRING DIAG: Parkinson   THERAPY DIAG:  Difficulty in walking, not elsewhere classified  Hypokinetic Parkinsonian dysphonia (HCC)  Muscle weakness (generalized)  History of falling  Rationale for Evaluation and Treatment: Rehabilitation  ONSET DATE: 01/26/2022  SUBJECTIVE:   SUBJECTIVE STATEMENT: Received shot in Rt knee, no reports of pain currently.  PERTINENT HISTORY: Ms. Wagoner states that she is falling to much.  She has fallen at least 8 times since April.  She has been using a  cane for while.  She also has a walker.  PT states that she has had home health before but did not feel that this helped much. PMH: Parkinson's, OA .  PAIN:  Are you having pain? Back we are not focusing on this, limited activity to not aggravate pt back   PRECAUTIONS: Fall WEIGHT BEARING RESTRICTIONS: No  FALLS:  Has patient fallen in last 6 months? Yes. Number of falls 8  PLOF: Independent with basic ADLs  PATIENT GOALS: to stop falling as much   NEXT MD VISIT: not sure  OBJECTIVE:  Note: Objective measures were completed at Evaluation unless otherwise noted.   COGNITION: Overall cognitive status: Within functional limits for tasks assessed     SENSATION: WFL  deg  POSTURE: rounded shoulders, decreased lumbar  lordosis, and decreased thoracic kyphosis  LOWER EXTREMITY MMT:  MMT Right eval Left eval  Hip flexion 5 5  Hip extension 3 3  Hip abduction 4+  3+  Knee flexion 3+ 3+  Knee extension 4+ 5  Ankle dorsiflexion 3+ 3+   FUNCTIONAL TESTS:  30 seconds chair stand test:  2 x in 30"  2 minute walk test: with cane 292 ft with knees bend and decreased ankle motion  Single leg stance:  RT: 4"; Lt 55"   TODAY'S TREATMENT:                                                                                                                              DATE:  11/30/22: Quadruped: UE/ LE extension 6x 3-5" holds UE/LE abduction 3x 3" holds Needle threading 5x each with head movements following fingers  Sidelying:  Abduction each 10x   Standing:   DGI for speed, stop/go Squat to heel raise no HHA 3-5" holds PWR Twist with reach PWR reach with numbers math problems reaching for number and answer talking loud Diagonal forward/backward gait with clapping down long hallway Sidestep with head turns slowly to reduce dizziness, difficult to coordinate   11/25/22: Quadruped: UE extension LE extensoin Thread needles Crawling 5RT front of mat   Standing: PWR Up! PWR! Rock PWR Twist!  Sidestep front of mat with RTB around thigh no HHA 5RT Diagonal forward and backward 5RT  11/13/22 Gait train with quad cane for proper technique.   Supine: PWR up x 10 x 2sets; prone PWR up x 2 sets ; standing PWR up x 2 sets  Big walk forward, backward and side stepping x 2  11/11/22: -PWR! Moves up x10, twist x10 bilat  -seated bouncy ball rebounding off wall (about 7' target) x15, then 15x seated on wobble disc (excellent explosive reaction timing catch catching)   -standing hips to wall, trunk extension pillow crush isometric 15x3secH   -AMB high intensity intervals, max speed 6x 54ft c 3lb AW bilat (~1.16m/s average for final 3)   -4ft retroAMB c 3lb AW bilat, NO DEVICE, mingaurd to total A for  LOB; beter control when  cued to maintain slight forward lean and decrease gait speed -37ft lateral stepping c 3lb AW bilat bilat no device (no frontal plane balance issues)   PATIENT EDUCATION:  Education details: HEP as below as well as PWR! in sitting position Person educated: Patient Education method: Explanation, Verbal cues, and Handouts Education comprehension: verbalized understanding and returned demonstration  HOME EXERCISE PROGRAM: Access Code: [email or web address] URL: [email or web address] Date: 11/04/2022 Prepared by: Virgina Organ  Exercises - Supine Bridge  - 2 x daily - 7 x weekly - 1 sets - 10 reps - 5 seconds hold - Supine Lower Trunk Rotation  - 2 x daily - 7 x weekly - 1 sets - 10 reps - 3 seconds  hold - Sit to Stand with Armchair  - 2 x daily - 7 x weekly - 1 sets - 10 reps - Prone Hip Extension  - 1 x daily - 7 x weekly - 2 sets - 10 reps - 3 seconds  hold  11/30/22: - Sidelying Hip Abduction  - 2 x daily - 7 x weekly - 2 sets - 10 reps - 5" hold - Quadruped Pelvic Floor Contraction with Opposite Arm and Leg Lift  - 1 x daily - 7 x weekly - 3 sets - 10 reps  ASSESSMENT:  CLINICAL IMPRESSION:  Advanced quadruped and PWR! Activities that was tolerated well.  Did require some cueing for proper extremity extended and coordinate with gait and UE movements, min guard for safety.  Pt presents with improved gait mechanics at EOS (used [facility] this session).  Added quadruped and sidelying exercises to HEP.    OBJECTIVE IMPAIRMENTS: Abnormal gait, decreased activity tolerance, decreased balance, decreased mobility, decreased strength, and increased fascial restrictions.   ACTIVITY LIMITATIONS: carrying, standing, and locomotion level  PARTICIPATION LIMITATIONS: shopping and community activity  REAB POTENTIAL: Good  CLINICAL DECISION MAKING: Evolving/moderate complexity  EVALUATION COMPLEXITY: Moderate   GOALS: Goals reviewed with patient? No  SHORT TERM  GOALS: Target date: 11/25/22 PT to be I in HEP in order to improve strength and balance to have not fallen in the past two weeks  Baseline: Goal status:  on-going   2.  PT to be able to complete 5 sit to stand in a 30 second period.   Baseline:  Goal status:  on-going   3.  PT to be able to balance on her left leg for 10 seconds to reduce risk of falling  Baseline:  Goal status:  on-going    LONG TERM GOALS: Target date: 12/17/22  PT to be I in an advanced HEP in order to have not had a fall in the past five weeks.  Baseline:  Goal status: on-going   2.  PT LE strength to be increased by one grade to allow pt to come sit to stand from a couch without difficulty  Baseline:  Goal status:  on-going  3.  Pt to be able to single leg stance on her Lt leg for 20 seconds to decrease risk of falling  Baseline:  Goal status:  on-going    PLAN:  PT FREQUENCY: 2x/week  PT DURATION: 6 weeks  PLANNED INTERVENTIONS: 97146- PT Re-evaluation, 97110-Therapeutic exercises, 97530- Therapeutic activity, [id number]- Neuromuscular re-education, 97535- Self Care, and Patient/Family education  PLAN FOR NEXT SESSION: Continue PWR! Activities, balance and gait training.    Becky Sax, LPTA/CLT; CBIS [phone]  Juel Burrow, PTA 11/30/2022, 10:44 AM

## 2022-12-02 ENCOUNTER — Ambulatory Visit (HOSPITAL_COMMUNITY): Payer: Medicare Other | Admitting: Physical Therapy

## 2022-12-02 ENCOUNTER — Other Ambulatory Visit: Payer: Self-pay

## 2022-12-02 ENCOUNTER — Telehealth: Payer: Self-pay | Admitting: Pharmacy Technician

## 2022-12-02 ENCOUNTER — Telehealth: Payer: Self-pay | Admitting: Neurology

## 2022-12-02 DIAGNOSIS — Z9181 History of falling: Secondary | ICD-10-CM | POA: Diagnosis not present

## 2022-12-02 DIAGNOSIS — M6281 Muscle weakness (generalized): Secondary | ICD-10-CM

## 2022-12-02 DIAGNOSIS — R262 Difficulty in walking, not elsewhere classified: Secondary | ICD-10-CM

## 2022-12-02 DIAGNOSIS — G20B2 Parkinson's disease with dyskinesia, with fluctuations: Secondary | ICD-10-CM

## 2022-12-02 DIAGNOSIS — R471 Dysarthria and anarthria: Secondary | ICD-10-CM | POA: Diagnosis not present

## 2022-12-02 DIAGNOSIS — R490 Dysphonia: Secondary | ICD-10-CM | POA: Diagnosis not present

## 2022-12-02 DIAGNOSIS — G20A1 Parkinson's disease without dyskinesia, without mention of fluctuations: Secondary | ICD-10-CM | POA: Diagnosis not present

## 2022-12-02 MED ORDER — RYTARY 36.25-145 MG PO CPCR: ORAL_CAPSULE | ORAL | 0 refills | Status: DC

## 2022-12-02 NOTE — Telephone Encounter (Signed)
Pt called in stating she has decided she likes the Rytary. She thinks it works better. She would like a prescription for it sent to [facility]

## 2022-12-02 NOTE — Telephone Encounter (Signed)
Called pateint and he said the 145 mg worked for her I have sent in that RX for her

## 2022-12-02 NOTE — Therapy (Signed)
OUTPATIENT PHYSICAL THERAPY TREATMENT   Patient Name: [name] MRN: [id number] DOB:22-Apr-1940, 82 y.o., female Today's Date: 12/02/2022  END OF SESSION:  PT End of Session - 12/02/22 1446     Visit Number 7    Number of Visits 12    Date for PT Re-Evaluation 12/16/22    Authorization Type medicare, BCBS Supplement    Progress Note Due on Visit 10    PT Start Time 1345    PT Stop Time 1430    PT Time Calculation (min) 45 min    Equipment Utilized During Treatment Gait belt    Activity Tolerance Patient tolerated treatment well;No increased pain    Behavior During Therapy WFL for tasks assessed/performed                 Past Medical History:  Diagnosis Date   Allergy    SEASONAL   Anemia    Anemia-NOS / PMH, Dr Arbutus Ped   Arthritis    Breast cancer [facility])    stage I right breast   Cataract    BILATERAL   Decreased vision    R eye   Esophageal Stricture    Hyperlipidemia    Monoclonal gammopathy    MVP (mitral valve prolapse)    Osteoporosis    Parkinson disease (HCC)    Skin cancer    basal cell L neck   Vertical diplopia    Past Surgical History:  Procedure Laterality Date   APPENDECTOMY  2002   BONE BIOPSY  11/06/10   BREAST LUMPECTOMY  02/2010   right breast lumpectomy and sentinel node biopsy   CARDIAC CATHETERIZATION  2005   neg   CATARACT EXTRACTION, BILATERAL  2018   COLONOSCOPY     negative    HEMORRHOID SURGERY  1970's   SALPINGOOPHORECTOMY  2002   for benign growths   SHOULDER SURGERY Right    TOTAL ABDOMINAL HYSTERECTOMY W/ BILATERAL SALPINGOOPHORECTOMY  82 yrs old   for pain,prolapse ; G3 P2   UPPER GASTROINTESTINAL ENDOSCOPY     Patient Active Problem List   Diagnosis Date Noted   Black stools 02/27/2020   Healthcare maintenance 02/05/2019   Mitral valve prolapse 12/14/2018   Pain in right knee 11/28/2018   Livedo reticularis 09/28/2018   Abdominal pain 02/01/2018   Low back pain 12/29/2017   Edema 11/13/2017   Right  arm pain 02/03/2017   Recurrent falls 02/03/2017   Advance care planning 01/23/2017   Hematuria 07/20/2016   Headache 05/11/2015   Globus sensation 04/24/2015   Shoulder pain 04/17/2014   Parkinson's disease (HCC) 06/05/2013   Medicare annual wellness visit, subsequent 05/02/2012   Neck pain 05/02/2012   Osteoporosis 03/08/2011   Breast cancer (HCC) 11/19/2010   GERD (gastroesophageal reflux disease) 08/01/2008   DYSPHAGIA UNSPECIFIED 08/01/2008   ORTHOSTATIC HYPOTENSION 06/28/2008   HLD (hyperlipidemia) 07/10/2007   MONOCLONAL GAMMOPATHY 07/10/2007   ANEMIA-NOS 07/10/2007   FATIGUE 07/10/2007    PCP: Crawford Givens  REFERRING PROVIDER: Vladimir Faster, DO  REFERRING DIAG: Parkinson   THERAPY DIAG:  Difficulty in walking, not elsewhere classified  Muscle weakness (generalized)  History of falling  Rationale for Evaluation and Treatment: Rehabilitation  ONSET DATE: 01/26/2022  SUBJECTIVE:   SUBJECTIVE STATEMENT: Pt states that she has no pain.  She does not have a lot of room in her significant others house to do the exercises   PERTINENT HISTORY: Ms. Hochberg states that she is falling to much.  She has fallen  at least 8 times since April.  She has been using a cane for while.  She also has a walker.  PT states that she has had home health before but did not feel that this helped much. PMH: Parkinson's, OA .  PAIN:  Are you having pain? Back we are not focusing on this, limited activity to not aggravate pt back   PRECAUTIONS: Fall WEIGHT BEARING RESTRICTIONS: No  FALLS:  Has patient fallen in last 6 months? Yes. Number of falls 8  PLOF: Independent with basic ADLs  PATIENT GOALS: to stop falling as much   NEXT MD VISIT: not sure  OBJECTIVE:  Note: Objective measures were completed at Evaluation unless otherwise noted.   COGNITION: Overall cognitive status: Within functional limits for tasks assessed     SENSATION: WFL  deg  POSTURE: rounded shoulders,  decreased lumbar lordosis, and decreased thoracic kyphosis  LOWER EXTREMITY MMT:  MMT Right eval Left eval  Hip flexion 5 5  Hip extension 3 3  Hip abduction 4+  3+  Knee flexion 3+ 3+  Knee extension 4+ 5  Ankle dorsiflexion 3+ 3+   FUNCTIONAL TESTS:  30 seconds chair stand test:  2 x in 30"  2 minute walk test: with cane 292 ft with knees bend and decreased ankle motion  Single leg stance:  RT: 4"; Lt 55"   TODAY'S TREATMENT:                                                                                                                              DATE:  12/02/22 POWER: UP, ROCK, TWIST and step all x 10 in Supine, prone, all fours and standing  Big side step, forward and backward walking x 2 Rt each Slant board stretch x 1 minute x 2  Nustep x 10 minutes   PATIENT EDUCATION:  Education details: HEP as below as well as PWR! in sitting position Person educated: Patient Education method: Explanation, Verbal cues, and Handouts Education comprehension: verbalized understanding and returned demonstration  HOME EXERCISE PROGRAM: Access Code: [email or web address] URL: [email or web address] Date: 11/04/2022 Prepared by: Virgina Organ  Exercises - Supine Bridge  - 2 x daily - 7 x weekly - 1 sets - 10 reps - 5 seconds hold - Supine Lower Trunk Rotation  - 2 x daily - 7 x weekly - 1 sets - 10 reps - 3 seconds  hold - Sit to Stand with Armchair  - 2 x daily - 7 x weekly - 1 sets - 10 reps - Prone Hip Extension  - 1 x daily - 7 x weekly - 2 sets - 10 reps - 3 seconds  hold  11/30/22: - Sidelying Hip Abduction  - 2 x daily - 7 x weekly - 2 sets - 10 reps - 5" hold - Quadruped Pelvic Floor Contraction with Opposite Arm and Leg Lift  - 1 x daily - 7 x weekly - 3 sets - 10 reps  ASSESSMENT:  CLINICAL IMPRESSION:  Completed PWR in all positions as well as walking in Big steps.   Pt will abnormal gait and mobility and will benefit from Parkinson based PWR techniques.    OBJECTIVE IMPAIRMENTS: Abnormal gait, decreased activity tolerance, decreased balance, decreased mobility, decreased strength, and increased fascial restrictions.   ACTIVITY LIMITATIONS: carrying, standing, and locomotion level  PARTICIPATION LIMITATIONS: shopping and community activity  REAB POTENTIAL: Good  CLINICAL DECISION MAKING: Evolving/moderate complexity  EVALUATION COMPLEXITY: Moderate   GOALS: Goals reviewed with patient? No  SHORT TERM GOALS: Target date: 11/25/22 PT to be I in HEP in order to improve strength and balance to have not fallen in the past two weeks  Baseline: Goal status:  on-going   2.  PT to be able to complete 5 sit to stand in a 30 second period.   Baseline:  Goal status:  on-going   3.  PT to be able to balance on her left leg for 10 seconds to reduce risk of falling  Baseline:  Goal status:  on-going    LONG TERM GOALS: Target date: 12/17/22  PT to be I in an advanced HEP in order to have not had a fall in the past five weeks.  Baseline:  Goal status: on-going   2.  PT LE strength to be increased by one grade to allow pt to come sit to stand from a couch without difficulty  Baseline:  Goal status:  on-going  3.  Pt to be able to single leg stance on her Lt leg for 20 seconds to decrease risk of falling  Baseline:  Goal status:  on-going    PLAN:  PT FREQUENCY: 2x/week  PT DURATION: 6 weeks  PLANNED INTERVENTIONS: 97146- PT Re-evaluation, 97110-Therapeutic exercises, 97530- Therapeutic activity, [id number]- Neuromuscular re-education, 97535- Self Care, and Patient/Family education  PLAN FOR NEXT SESSION: Continue PWR! Activities, balance and gait training.   Virgina Organ, PT CLT [phone]  12/02/2022, 2:49 PM

## 2022-12-02 NOTE — Telephone Encounter (Signed)
Pharmacy Patient Advocate Encounter   Received notification from CoverMyMeds that prior authorization for RYTARY 36.25-145MG  is required/requested.   Insurance verification completed.   The patient is insured through [facility] .   Per test claim: PA required; PA submitted to above mentioned insurance via CoverMyMeds Key/confirmation #/EOC [id number] Status is pending

## 2022-12-06 ENCOUNTER — Encounter: Payer: Self-pay | Admitting: Internal Medicine

## 2022-12-06 ENCOUNTER — Other Ambulatory Visit (HOSPITAL_COMMUNITY): Payer: Self-pay

## 2022-12-06 NOTE — Telephone Encounter (Signed)
Pharmacy Patient Advocate Encounter  Received notification from [facility] that Prior Authorization for RYTARY 36.25-145MG   has been APPROVED from 12/02/2022 to 12/02/2003. Ran test claim, Copay is $695.31 due to a deductible. This test claim was processed through [facility]- copay amounts may vary at other pharmacies due to pharmacy/plan contracts, or as the patient moves through the different stages of their insurance plan.   PA #/Case ID/Reference #: [id number]

## 2022-12-07 ENCOUNTER — Encounter (HOSPITAL_COMMUNITY): Payer: Self-pay | Admitting: Speech Pathology

## 2022-12-07 ENCOUNTER — Ambulatory Visit (HOSPITAL_COMMUNITY): Payer: Medicare Other | Admitting: Physical Therapy

## 2022-12-07 ENCOUNTER — Ambulatory Visit (HOSPITAL_COMMUNITY): Payer: Medicare Other | Admitting: Speech Pathology

## 2022-12-07 DIAGNOSIS — G20A1 Parkinson's disease without dyskinesia, without mention of fluctuations: Secondary | ICD-10-CM

## 2022-12-07 DIAGNOSIS — M6281 Muscle weakness (generalized): Secondary | ICD-10-CM | POA: Diagnosis not present

## 2022-12-07 DIAGNOSIS — Z9181 History of falling: Secondary | ICD-10-CM | POA: Diagnosis not present

## 2022-12-07 DIAGNOSIS — R471 Dysarthria and anarthria: Secondary | ICD-10-CM

## 2022-12-07 DIAGNOSIS — R262 Difficulty in walking, not elsewhere classified: Secondary | ICD-10-CM

## 2022-12-07 DIAGNOSIS — R490 Dysphonia: Secondary | ICD-10-CM | POA: Diagnosis not present

## 2022-12-07 NOTE — Therapy (Signed)
OUTPATIENT SPEECH LANGUAGE PATHOLOGY PARKINSON'S TREATMENT   Patient Name: [name] MRN: [id number] DOB:[birth year], 82 y.o., female Today's Date: 12/07/2022  PCP: Joaquim Nam, MD REFERRING PROVIDER: Vladimir Faster, DO  END OF SESSION:  End of Session - 12/07/22 1453     Visit Number 2    Number of Visits 10    Date for SLP Re-Evaluation 12/30/22    Authorization Type Medicare Primary and BCBS secondary    SLP Start Time 1430    SLP Stop Time  1515    SLP Time Calculation (min) 45 min    Activity Tolerance Patient tolerated treatment well             Past Medical History:  Diagnosis Date   Allergy    SEASONAL   Anemia    Anemia-NOS / PMH, Dr Arbutus Ped   Arthritis    Breast cancer (HCC)    stage I right breast   Cataract    BILATERAL   Decreased vision    R eye   Esophageal Stricture    Hyperlipidemia    Monoclonal gammopathy    MVP (mitral valve prolapse)    Osteoporosis    Parkinson disease (HCC)    Skin cancer    basal cell L neck   Vertical diplopia    Past Surgical History:  Procedure Laterality Date   APPENDECTOMY  2002   BONE BIOPSY  11/06/10   BREAST LUMPECTOMY  02/2010   right breast lumpectomy and sentinel node biopsy   CARDIAC CATHETERIZATION  2005   neg   CATARACT EXTRACTION, BILATERAL  2018   COLONOSCOPY     negative    HEMORRHOID SURGERY  1970's   SALPINGOOPHORECTOMY  2002   for benign growths   SHOULDER SURGERY Right    TOTAL ABDOMINAL HYSTERECTOMY W/ BILATERAL SALPINGOOPHORECTOMY  82 yrs old   for pain,prolapse ; G3 P2   UPPER GASTROINTESTINAL ENDOSCOPY     Patient Active Problem List   Diagnosis Date Noted   Black stools 02/27/2020   Healthcare maintenance 02/05/2019   Mitral valve prolapse 12/14/2018   Pain in right knee 11/28/2018   Livedo reticularis 09/28/2018   Abdominal pain 02/01/2018   Low back pain 12/29/2017   Edema 11/13/2017   Right arm pain 02/03/2017   Recurrent falls 02/03/2017   Advance care  planning 01/23/2017   Hematuria 07/20/2016   Headache 05/11/2015   Globus sensation 04/24/2015   Shoulder pain 04/17/2014   Parkinson's disease (HCC) 06/05/2013   Medicare annual wellness visit, subsequent 05/02/2012   Neck pain 05/02/2012   Osteoporosis 03/08/2011   Breast cancer (HCC) 11/19/2010   GERD (gastroesophageal reflux disease) 08/01/2008   DYSPHAGIA UNSPECIFIED 08/01/2008   ORTHOSTATIC HYPOTENSION 06/28/2008   HLD (hyperlipidemia) 07/10/2007   MONOCLONAL GAMMOPATHY 07/10/2007   ANEMIA-NOS 07/10/2007   FATIGUE 07/10/2007    ONSET DATE: 06/05/2013  REFERRING DIAG: Parkinson's disease  THERAPY DIAG:  Dysarthria and anarthria  Hypokinetic Parkinsonian dysphonia (HCC)  Rationale for Evaluation and Treatment: Rehabilitation  SUBJECTIVE:   SUBJECTIVE STATEMENT: "My son told me he couldn't understand me so my daughter in law told me to speak with intent."  Pt accompanied by: self and family member  PERTINENT HISTORY: [name] is an 82 yo female who was referred for SLP evaluation and treatment for dysarthria in setting of Parkinson's Disease. Pt indicates she was diagnosed with PD in May of 2015 and did have some SLP therapy the following year in [place]. Pt recently had  Xeomin for her sialorrhea.She was referred for speech evaluation by Dr. Lurena Joiner Tat, DO.   PAIN:  Are you having pain? No  FALLS: Has patient fallen in last 6 months?  Yes  PATIENT GOALS: Increase speech intelligibility  OBJECTIVE VOICE ASSESSMENT: Sustained "ah" maximum phonation time: 11 seconds Sustained "ah" loudness average: 82 dB Oral reading (passage) loudness average: 70 dB Oral reading loudness range: 69-72 dB Conversational loudness average: 78 dB Conversational loudness range:  Voice quality: diplophonia and tremor Stimulability trials: Given SLP modeling and occasional min cues, loudness average increased to 92dB (range of 86 to 92) at (loud "ah", word, sentence,  paragraph, conversation) level.    TODAY'S TREATMENT:  Pt seen for first treatment session for hypokinetic dysarthria with use of SPEAK OUT! Therapy techniques. She completed the 6 exercises: m cycle, sustained /a/, glide, counting, oral reading, and cognitive/conversation with modeling and verbal cues from SLP. She maintained an average vocal intensity of ~82 dB for m-cycle and ~84 dB for sustained /a/ and counting. She had the most difficulty with the glide and benefited from modeling and visual cues. Pt required indirect cues for "intent" during oral reading task and mi/mod cues during spontaneous speech and in cognitive communication tasks (occasionally dropping off at the ends of sentences). Pt and family indicated they watched the Parkinson's Information video and found it informative. Continue with Lesson 3 next session and Pt to complete workbook 2x/daily at home.                                                                  DATE: 12/07/22  PATIENT EDUCATION: Education details: SPEAK OUT! Therapy workbook Lesson 2+ Person educated: Patient and Child(ren) Education method: Explanation Education comprehension: verbalized understanding  HOME EXERCISE PROGRAM: Pt will completed HEP as assigned to facilitate carryover of speech tasks in home and community environment.   GOALS: Goals reviewed with patient? Yes  SHORT TERM GOALS: Target date: 01/22/23  Pt will coordinate vocal and articulatory subsystems in hierarchical speech tasks by producing sounds with intention with min assistance.  Baseline: mi/mod Goal status: ONGOING  2.  Generalize intentional speech to cognitive-linguistic exercises and conversational speech with improved vocal quality, loudness, articulatory precision, and endurance while maintaining a minimum of 85 dB with min assistance. Baseline: ~78 dB Goal status: ONGOING  3.  Read phrases, sentences, and paragraphs with intention, yielding improved vocal quality,  loudness, articulatory precision, and endurance while maintaining a minimum of 85 dB with min assistance.  Baseline: ~69 dB Goal status: ONGOING  4.  Patient will maintain a maximum phonation duration of 10+ seconds or greater with minimal cues during sustained phonation over 10 trials. Baseline: Met over one trial, reduced with additional repetitions Goal status: ONGOING  LONG TERM GOALS: Target date: 02/26/2023  Pt will speak with intent during spontaneous 1:1 conversations with indirect cues. Baseline: mi/mod Goal status: ONGOING   ASSESSMENT:  CLINICAL IMPRESSION: (from initial evaluation 11/16/2022) Patient is an 82 y.o. female who was seen today for a speech evaluation. 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 She demonstrates adequate vocal intensity ~75% of the time, but has difficulty with pacing and breath support which is negatively impacted by dyskinesia. Pt participated in SLP therapy in the  past and is familiar with increasing vocal intensity to improve speech. Pt with reduced vocal intensity during oral reading and spontaneous conversation (~69 dB) and benefited from cues to use vocal intent and projection. Pt is a good candidate for SPEAK OUT! Therapy and is motivated to address her dysarthria.   OBJECTIVE IMPAIRMENTS: Objective impairments include dysarthria. These impairments are limiting patient from effectively communicating at home and in community.Factors affecting potential to achieve goals and functional outcome are medical prognosis.. Patient will benefit from skilled SLP services to address above impairments and improve overall function.  REHAB POTENTIAL: Good  PLAN:  SLP FREQUENCY: 2x/week  SLP DURATION: other: for 4 weeks and then one additional visit 4-6 weeks after the 8th treatment session  PLANNED INTERVENTIONS: 92507 Treatment of speech (30 or 45 min) , Re-evaluation, Cueing hierachy, Internal/external aids,  Functional tasks, and Multimodal communication approach   Thank you,  Havery Moros, CCC-SLP [phone]  Rhys Lichty, CCC-SLP 12/07/2022, 3:08 PM

## 2022-12-07 NOTE — Therapy (Signed)
OUTPATIENT PHYSICAL THERAPY TREATMENT   Patient Name: [name] MRN: [id number] DOB:13-Mar-1940, 82 y.o., female Today's Date: 12/08/2022  END OF SESSION:  PT End of Session - 12/07/22 1431     Visit Number 8    Number of Visits 12    Date for PT Re-Evaluation 12/16/22    Authorization Type medicare, BCBS Supplement    Progress Note Due on Visit 10    PT Start Time 1345    PT Stop Time 1430    PT Time Calculation (min) 45 min    Equipment Utilized During Treatment Gait belt    Activity Tolerance Patient tolerated treatment well;No increased pain    Behavior During Therapy WFL for tasks assessed/performed                  Past Medical History:  Diagnosis Date   Allergy    SEASONAL   Anemia    Anemia-NOS / PMH, Dr [name]   Arthritis    Breast cancer [facility])    stage I right breast   Cataract    BILATERAL   Decreased vision    R eye   Esophageal Stricture    Hyperlipidemia    Monoclonal gammopathy    MVP (mitral valve prolapse)    Osteoporosis    Parkinson disease (HCC)    Skin cancer    basal cell L neck   Vertical diplopia    Past Surgical History:  Procedure Laterality Date   APPENDECTOMY  2002   BONE BIOPSY  11/06/10   BREAST LUMPECTOMY  02/2010   right breast lumpectomy and sentinel node biopsy   CARDIAC CATHETERIZATION  2005   neg   CATARACT EXTRACTION, BILATERAL  2018   COLONOSCOPY     negative    HEMORRHOID SURGERY  1970's   SALPINGOOPHORECTOMY  2002   for benign growths   SHOULDER SURGERY Right    TOTAL ABDOMINAL HYSTERECTOMY W/ BILATERAL SALPINGOOPHORECTOMY  82 yrs old   for pain,prolapse ; G3 P2   UPPER GASTROINTESTINAL ENDOSCOPY     Patient Active Problem List   Diagnosis Date Noted   Black stools 02/27/2020   Healthcare maintenance 02/05/2019   Mitral valve prolapse 12/14/2018   Pain in right knee 11/28/2018   Livedo reticularis 09/28/2018   Abdominal pain 02/01/2018   Low back pain 12/29/2017   Edema 11/13/2017    Right arm pain 02/03/2017   Recurrent falls 02/03/2017   Advance care planning 01/23/2017   Hematuria 07/20/2016   Headache 05/11/2015   Globus sensation 04/24/2015   Shoulder pain 04/17/2014   Parkinson's disease (HCC) 06/05/2013   Medicare annual wellness visit, subsequent 05/02/2012   Neck pain 05/02/2012   Osteoporosis 03/08/2011   Breast cancer (HCC) 11/19/2010   GERD (gastroesophageal reflux disease) 08/01/2008   DYSPHAGIA UNSPECIFIED 08/01/2008   ORTHOSTATIC HYPOTENSION 06/28/2008   HLD (hyperlipidemia) 07/10/2007   MONOCLONAL GAMMOPATHY 07/10/2007   ANEMIA-NOS 07/10/2007   FATIGUE 07/10/2007    PCP: [name]  REFERRING PROVIDER: Vladimir Faster, DO  REFERRING DIAG: Parkinson   THERAPY DIAG:  Difficulty in walking, not elsewhere classified  Muscle weakness (generalized)  History of falling  Hypokinetic Parkinsonian dysphonia (HCC)  Rationale for Evaluation and Treatment: Rehabilitation  ONSET DATE: 01/26/2022  SUBJECTIVE:   SUBJECTIVE STATEMENT: Pt states that she has no pain.  She is trying to do the exercises at home.  PERTINENT HISTORY: Ms. [name] states that she is falling to much.  She has fallen at least 8  times since April.  She has been using a cane for while.  She also has a walker.  PT states that she has had home health before but did not feel that this helped much. PMH: Parkinson's, OA .  PAIN:  Are you having pain? Back we are not focusing on this, limited activity to not aggravate pt back   PRECAUTIONS: Fall WEIGHT BEARING RESTRICTIONS: No  FALLS:  Has patient fallen in last 6 months? Yes. Number of falls 8  PLOF: Independent with basic ADLs  PATIENT GOALS: to stop falling as much   NEXT MD VISIT: not sure  OBJECTIVE:  Note: Objective measures were completed at Evaluation unless otherwise noted.   COGNITION: Overall cognitive status: Within functional limits for tasks assessed     SENSATION: WFL  deg  POSTURE: rounded  shoulders, decreased lumbar lordosis, and decreased thoracic kyphosis  LOWER EXTREMITY MMT:  MMT Right eval Left eval  Hip flexion 5 5  Hip extension 3 3  Hip abduction 4+  3+  Knee flexion 3+ 3+  Knee extension 4+ 5  Ankle dorsiflexion 3+ 3+   FUNCTIONAL TESTS:  30 seconds chair stand test:  2 x in 30"  2 minute walk test: with cane 292 ft with knees bend and decreased ankle motion  Single leg stance:  RT: 4"; Lt 55"   TODAY'S TREATMENT:                                                                                                                              DATE:  12/07/22 Against the wall;  Rt arm flexion with LT arm pressing into wall into extension flowing to opposite x 10 Marching high x 10 Opposite arm/leg against the wall x 10   PWR UP, ROCK, TWIST and step Standing , supine, prone and all 4 all x 10  Sit to stand 10 in 30"  PATIENT EDUCATION:  Education details: HEP as below as well as PWR! in sitting position Person educated: Patient Education method: Explanation, Verbal cues, and Handouts Education comprehension: verbalized understanding and returned demonstration  HOME EXERCISE PROGRAM: Access Code: [email or web address] URL: [email or web address] Date: 11/04/2022 Prepared by: [name]  Exercises - Supine Bridge  - 2 x daily - 7 x weekly - 1 sets - 10 reps - 5 seconds hold - Supine Lower Trunk Rotation  - 2 x daily - 7 x weekly - 1 sets - 10 reps - 3 seconds  hold - Sit to Stand with Armchair  - 2 x daily - 7 x weekly - 1 sets - 10 reps - Prone Hip Extension  - 1 x daily - 7 x weekly - 2 sets - 10 reps - 3 seconds  hold  11/30/22: - Sidelying Hip Abduction  - 2 x daily - 7 x weekly - 2 sets - 10 reps - 5" hold - Quadruped Pelvic Floor Contraction with Opposite Arm and Leg Lift  - 1 x  daily - 7 x weekly - 3 sets - 10 reps  ASSESSMENT:  CLINICAL IMPRESSION:  Pt has not fallen since she started therapy but is now walking with a 4 wheeled  walker.  Pt continues to go to small movement but will correct when therapist verbally corrects her.     Pt with  abnormal gait and mobility due to  Parkinson and will continue to benefit from  PWR techniques.   OBJECTIVE IMPAIRMENTS: Abnormal gait, decreased activity tolerance, decreased balance, decreased mobility, decreased strength, and increased fascial restrictions.   ACTIVITY LIMITATIONS: carrying, standing, and locomotion level  PARTICIPATION LIMITATIONS: shopping and community activity  REAB POTENTIAL: Good  CLINICAL DECISION MAKING: Evolving/moderate complexity  EVALUATION COMPLEXITY: Moderate   GOALS: Goals reviewed with patient? No  SHORT TERM GOALS: Target date: 11/25/22 PT to be I in HEP in order to improve strength and balance to have not fallen in the past two weeks  Baseline: Goal status:  met    2.  PT to be able to complete 5 sit to stand in a 30 second period.   Baseline:  Goal status:  met   3.  PT to be able to balance on her left leg for 10 seconds to reduce risk of falling  Baseline:  Goal status:  on-going    LONG TERM GOALS: Target date: 12/17/22  PT to be I in an advanced HEP in order to have not had a fall in the past five weeks.  Baseline:  Goal status: on-going   2.  PT LE strength to be increased by one grade to allow pt to come sit to stand from a couch without difficulty  Baseline:  Goal status:  on-going  3.  Pt to be able to single leg stance on her Lt leg for 20 seconds to decrease risk of falling  Baseline:  Goal status:  on-going    PLAN:  PT FREQUENCY: 2x/week  PT DURATION: 6 weeks  PLANNED INTERVENTIONS: 97146- PT Re-evaluation, 97110-Therapeutic exercises, 97530- Therapeutic activity, [id number]- Neuromuscular re-education, 97535- Self Care, and Patient/Family education  PLAN FOR NEXT SESSION: Continue PWR! Activities, balance and gait training.   [name], PT CLT [phone]  12/08/2022, 9:52 AM

## 2022-12-09 ENCOUNTER — Encounter (HOSPITAL_COMMUNITY): Payer: Medicare Other | Admitting: Physical Therapy

## 2022-12-09 ENCOUNTER — Ambulatory Visit (HOSPITAL_COMMUNITY): Payer: Medicare Other | Admitting: Speech Pathology

## 2022-12-09 ENCOUNTER — Encounter (HOSPITAL_COMMUNITY): Payer: Self-pay | Admitting: Speech Pathology

## 2022-12-09 ENCOUNTER — Ambulatory Visit (HOSPITAL_COMMUNITY): Payer: Medicare Other

## 2022-12-09 DIAGNOSIS — R490 Dysphonia: Secondary | ICD-10-CM | POA: Diagnosis not present

## 2022-12-09 DIAGNOSIS — Z9181 History of falling: Secondary | ICD-10-CM | POA: Diagnosis not present

## 2022-12-09 DIAGNOSIS — R262 Difficulty in walking, not elsewhere classified: Secondary | ICD-10-CM

## 2022-12-09 DIAGNOSIS — G20A1 Parkinson's disease without dyskinesia, without mention of fluctuations: Secondary | ICD-10-CM | POA: Diagnosis not present

## 2022-12-09 DIAGNOSIS — M6281 Muscle weakness (generalized): Secondary | ICD-10-CM

## 2022-12-09 DIAGNOSIS — R471 Dysarthria and anarthria: Secondary | ICD-10-CM

## 2022-12-09 NOTE — Therapy (Signed)
OUTPATIENT PHYSICAL THERAPY TREATMENT   Patient Name: [name] MRN: [id number] DOB:30-May-1940, 82 y.o., female Today's Date: 12/09/2022  END OF SESSION:  PT End of Session - 12/09/22 1117     Visit Number 9    Number of Visits 12    Date for PT Re-Evaluation 12/16/22    Authorization Type medicare, BCBS Supplement    Progress Note Due on Visit 10    PT Start Time 1117   late check in   PT Stop Time 1145    PT Time Calculation (min) 28 min    Equipment Utilized During Treatment Gait belt    Activity Tolerance Patient tolerated treatment well;No increased pain    Behavior During Therapy WFL for tasks assessed/performed                  Past Medical History:  Diagnosis Date   Allergy    SEASONAL   Anemia    Anemia-NOS / PMH, Dr Arbutus Ped   Arthritis    Breast cancer [facility])    stage I right breast   Cataract    BILATERAL   Decreased vision    R eye   Esophageal Stricture    Hyperlipidemia    Monoclonal gammopathy    MVP (mitral valve prolapse)    Osteoporosis    Parkinson disease (HCC)    Skin cancer    basal cell L neck   Vertical diplopia    Past Surgical History:  Procedure Laterality Date   APPENDECTOMY  2002   BONE BIOPSY  11/06/10   BREAST LUMPECTOMY  02/2010   right breast lumpectomy and sentinel node biopsy   CARDIAC CATHETERIZATION  2005   neg   CATARACT EXTRACTION, BILATERAL  2018   COLONOSCOPY     negative    HEMORRHOID SURGERY  1970's   SALPINGOOPHORECTOMY  2002   for benign growths   SHOULDER SURGERY Right    TOTAL ABDOMINAL HYSTERECTOMY W/ BILATERAL SALPINGOOPHORECTOMY  82 yrs old   for pain,prolapse ; G3 P2   UPPER GASTROINTESTINAL ENDOSCOPY     Patient Active Problem List   Diagnosis Date Noted   Black stools 02/27/2020   Healthcare maintenance 02/05/2019   Mitral valve prolapse 12/14/2018   Pain in right knee 11/28/2018   Livedo reticularis 09/28/2018   Abdominal pain 02/01/2018   Low back pain 12/29/2017   Edema  11/13/2017   Right arm pain 02/03/2017   Recurrent falls 02/03/2017   Advance care planning 01/23/2017   Hematuria 07/20/2016   Headache 05/11/2015   Globus sensation 04/24/2015   Shoulder pain 04/17/2014   Parkinson's disease (HCC) 06/05/2013   Medicare annual wellness visit, subsequent 05/02/2012   Neck pain 05/02/2012   Osteoporosis 03/08/2011   Breast cancer (HCC) 11/19/2010   GERD (gastroesophageal reflux disease) 08/01/2008   DYSPHAGIA UNSPECIFIED 08/01/2008   ORTHOSTATIC HYPOTENSION 06/28/2008   HLD (hyperlipidemia) 07/10/2007   MONOCLONAL GAMMOPATHY 07/10/2007   ANEMIA-NOS 07/10/2007   FATIGUE 07/10/2007    PCP: Crawford Givens  REFERRING PROVIDER: Vladimir Faster, DO  REFERRING DIAG: Parkinson   THERAPY DIAG:  Dysarthria and anarthria  Hypokinetic Parkinsonian dysphonia (HCC)  Difficulty in walking, not elsewhere classified  Muscle weakness (generalized)  History of falling  Rationale for Evaluation and Treatment: Rehabilitation  ONSET DATE: 01/26/2022  SUBJECTIVE:   SUBJECTIVE STATEMENT: No pain, no new falls; cannot afford the medication Dr. Arbutus Leas prescribed so she has called and left the office a message.  PERTINENT HISTORY: [name] states  that she is falling to much.  She has fallen at least 8 times since April.  She has been using a cane for while.  She also has a walker.  PT states that she has had home health before but did not feel that this helped much. PMH: Parkinson's, OA .  PAIN:  Are you having pain? Back we are not focusing on this, limited activity to not aggravate pt back   PRECAUTIONS: Fall WEIGHT BEARING RESTRICTIONS: No  FALLS:  Has patient fallen in last 6 months? Yes. Number of falls 8  PLOF: Independent with basic ADLs  PATIENT GOALS: to stop falling as much   NEXT MD VISIT: not sure  OBJECTIVE:  Note: Objective measures were completed at Evaluation unless otherwise noted.   COGNITION: Overall cognitive status: Within  functional limits for tasks assessed     SENSATION: WFL  deg  POSTURE: rounded shoulders, decreased lumbar lordosis, and decreased thoracic kyphosis  LOWER EXTREMITY MMT:  MMT Right eval Left eval  Hip flexion 5 5  Hip extension 3 3  Hip abduction 4+  3+  Knee flexion 3+ 3+  Knee extension 4+ 5  Ankle dorsiflexion 3+ 3+   FUNCTIONAL TESTS:  30 seconds chair stand test:  2 x in 30"  2 minute walk test: with cane 292 ft with knees bend and decreased ankle motion  Single leg stance:  RT: 4"; Lt 55"   TODAY'S TREATMENT:                                                                                                                              DATE:  12/09/23 Standing: Heel raises on incline x 10 Toe raises on decline x 10 Sidestepping in // bars down and back x 3 Sit to stand 2 x 10 no UE assist Standing and rotate trunk with arms out hand clap x 10 each Sidestep and abduct arms to 90 x 10 each side Forward step and flex arms x 10 each SLS 2 each leg; 30" max each no UE assist Marching/ high knees 2 x 10 no UE assist  12/07/22 Against the wall;  Rt arm flexion with LT arm pressing into wall into extension flowing to opposite x 10 Marching high x 10 Opposite arm/leg against the wall x 10   PWR UP, ROCK, TWIST and step Standing , supine, prone and all 4 all x 10  Sit to stand 10 in 30"  PATIENT EDUCATION:  Education details: HEP as below as well as PWR! in sitting position Person educated: Patient Education method: Explanation, Verbal cues, and Handouts Education comprehension: verbalized understanding and returned demonstration  HOME EXERCISE PROGRAM: Access Code: [email or web address] URL: [email or web address] Date: 11/04/2022 Prepared by: Virgina Organ  Exercises - Supine Bridge  - 2 x daily - 7 x weekly - 1 sets - 10 reps - 5 seconds hold - Supine Lower Trunk Rotation  - 2 x daily - 7 x weekly -  1 sets - 10 reps - 3 seconds  hold - Sit to Stand with  Armchair  - 2 x daily - 7 x weekly - 1 sets - 10 reps - Prone Hip Extension  - 1 x daily - 7 x weekly - 2 sets - 10 reps - 3 seconds  hold  11/30/22: - Sidelying Hip Abduction  - 2 x daily - 7 x weekly - 2 sets - 10 reps - 5" hold - Quadruped Pelvic Floor Contraction with Opposite Arm and Leg Lift  - 1 x daily - 7 x weekly - 3 sets - 10 reps  ASSESSMENT:  CLINICAL IMPRESSION:  Today's session with focus on balance and coordinating movement.  She continues with abnormal movement especially with left upper extremity but improves with intention.  Good improvement noted with SLS balance today.    Pt with  abnormal gait and mobility due to  Parkinson and will continue to benefit from  PWR techniques.   OBJECTIVE IMPAIRMENTS: Abnormal gait, decreased activity tolerance, decreased balance, decreased mobility, decreased strength, and increased fascial restrictions.   ACTIVITY LIMITATIONS: carrying, standing, and locomotion level  PARTICIPATION LIMITATIONS: shopping and community activity  REAB POTENTIAL: Good  CLINICAL DECISION MAKING: Evolving/moderate complexity  EVALUATION COMPLEXITY: Moderate   GOALS: Goals reviewed with patient? No  SHORT TERM GOALS: Target date: 11/25/22 PT to be I in HEP in order to improve strength and balance to have not fallen in the past two weeks  Baseline: Goal status:  met    2.  PT to be able to complete 5 sit to stand in a 30 second period.   Baseline:  Goal status:  met   3.  PT to be able to balance on her left leg for 10 seconds to reduce risk of falling  Baseline:  Goal status:  on-going    LONG TERM GOALS: Target date: 12/17/22  PT to be I in an advanced HEP in order to have not had a fall in the past five weeks.  Baseline:  Goal status: on-going   2.  PT LE strength to be increased by one grade to allow pt to come sit to stand from a couch without difficulty  Baseline:  Goal status:  on-going  3.  Pt to be able to single leg stance on  her Lt leg for 20 seconds to decrease risk of falling  Baseline:  Goal status:  on-going    PLAN:  PT FREQUENCY: 2x/week  PT DURATION: 6 weeks  PLANNED INTERVENTIONS: 97146- PT Re-evaluation, 97110-Therapeutic exercises, 97530- Therapeutic activity, [id number]- Neuromuscular re-education, 97535- Self Care, and Patient/Family education  PLAN FOR NEXT SESSION: Continue PWR! Activities, balance and gait training.    11:48 AM, 12/09/22 Estephanie Hubbs Small Queena Monrreal MPT [facility] [place] [phone]

## 2022-12-09 NOTE — Therapy (Signed)
OUTPATIENT SPEECH LANGUAGE PATHOLOGY PARKINSON'S TREATMENT   Patient Name: [name] MRN: [id number] DOB:28-May-1940, 82 y.o., female Today's Date: 12/09/2022  PCP: Joaquim Nam, MD REFERRING PROVIDER: Vladimir Faster, DO  END OF SESSION:  End of Session - 12/09/22 1054     Visit Number 3    Number of Visits 10    Date for SLP Re-Evaluation 12/30/22    Authorization Type Medicare Primary and BCBS secondary    SLP Start Time 1015    SLP Stop Time  1100    SLP Time Calculation (min) 45 min    Activity Tolerance Patient tolerated treatment well             Past Medical History:  Diagnosis Date   Allergy    SEASONAL   Anemia    Anemia-NOS / PMH, Dr Arbutus Ped   Arthritis    Breast cancer (HCC)    stage I right breast   Cataract    BILATERAL   Decreased vision    R eye   Esophageal Stricture    Hyperlipidemia    Monoclonal gammopathy    MVP (mitral valve prolapse)    Osteoporosis    Parkinson disease (HCC)    Skin cancer    basal cell L neck   Vertical diplopia    Past Surgical History:  Procedure Laterality Date   APPENDECTOMY  2002   BONE BIOPSY  11/06/10   BREAST LUMPECTOMY  02/2010   right breast lumpectomy and sentinel node biopsy   CARDIAC CATHETERIZATION  2005   neg   CATARACT EXTRACTION, BILATERAL  2018   COLONOSCOPY     negative    HEMORRHOID SURGERY  1970's   SALPINGOOPHORECTOMY  2002   for benign growths   SHOULDER SURGERY Right    TOTAL ABDOMINAL HYSTERECTOMY W/ BILATERAL SALPINGOOPHORECTOMY  82 yrs old   for pain,prolapse ; G3 P2   UPPER GASTROINTESTINAL ENDOSCOPY     Patient Active Problem List   Diagnosis Date Noted   Black stools 02/27/2020   Healthcare maintenance 02/05/2019   Mitral valve prolapse 12/14/2018   Pain in right knee 11/28/2018   Livedo reticularis 09/28/2018   Abdominal pain 02/01/2018   Low back pain 12/29/2017   Edema 11/13/2017   Right arm pain 02/03/2017   Recurrent falls 02/03/2017   Advance care  planning 01/23/2017   Hematuria 07/20/2016   Headache 05/11/2015   Globus sensation 04/24/2015   Shoulder pain 04/17/2014   Parkinson's disease (HCC) 06/05/2013   Medicare annual wellness visit, subsequent 05/02/2012   Neck pain 05/02/2012   Osteoporosis 03/08/2011   Breast cancer (HCC) 11/19/2010   GERD (gastroesophageal reflux disease) 08/01/2008   DYSPHAGIA UNSPECIFIED 08/01/2008   ORTHOSTATIC HYPOTENSION 06/28/2008   HLD (hyperlipidemia) 07/10/2007   MONOCLONAL GAMMOPATHY 07/10/2007   ANEMIA-NOS 07/10/2007   FATIGUE 07/10/2007    ONSET DATE: 06/05/2013  REFERRING DIAG: Parkinson's disease  THERAPY DIAG:  Dysarthria and anarthria  Hypokinetic Parkinsonian dysphonia (HCC)  Rationale for Evaluation and Treatment: Rehabilitation  SUBJECTIVE:   SUBJECTIVE STATEMENT: "I think speaking with intent means I need to concentrate."  Pt accompanied by: self and family member  PERTINENT HISTORY: [name] is an 82 yo female who was referred for SLP evaluation and treatment for dysarthria in setting of Parkinson's Disease. Pt indicates she was diagnosed with PD in May of 2015 and did have some SLP therapy the following year in [place]. Pt recently had Xeomin for her sialorrhea.She was referred for speech evaluation  by Dr. Lurena Joiner Tat, DO.   PAIN:  Are you having pain? No  FALLS: Has patient fallen in last 6 months?  Yes  PATIENT GOALS: Increase speech intelligibility  OBJECTIVE VOICE ASSESSMENT: Sustained "ah" maximum phonation time: 11 seconds Sustained "ah" loudness average: 82 dB Oral reading (passage) loudness average: 70 dB Oral reading loudness range: 69-72 dB Conversational loudness average: 78 dB Conversational loudness range:  Voice quality: diplophonia and tremor Stimulability trials: Given SLP modeling and occasional min cues, loudness average increased to 92dB (range of 86 to 92) at (loud "ah", word, sentence, paragraph, conversation) level.     Previous Session: Pt seen for first treatment session for hypokinetic dysarthria with use of SPEAK OUT! Therapy techniques. She completed the 6 exercises: m cycle, sustained /a/, glide, counting, oral reading, and cognitive/conversation with modeling and verbal cues from SLP. She maintained an average vocal intensity of ~82 dB for m-cycle and ~84 dB for sustained /a/ and counting. She had the most difficulty with the glide and benefited from modeling and visual cues. Pt required indirect cues for "intent" during oral reading task and mi/mod cues during spontaneous speech and in cognitive communication tasks (occasionally dropping off at the ends of sentences). Pt and family indicated they watched the Parkinson's Information video and found it informative. Continue with Lesson 3 next session and Pt to complete workbook 2x/daily at home.       TODAY'S TREATMENT:  Pt was accompanied to therapy by her daughter-in-law. She brought her SPEAK OUT! Therapy workbook and reported that she completed lessons at home. She completed the 6 exercises: m cycle, sustained /a/, glide, counting, oral reading, and cognitive/conversation with modeling and verbal cues from SLP. She was encouraged to sip on water throughout the session. SHe completed all exercises with good "intent" and only required min cues for cognitive task and spontaneous conversation. SLP recorded Pt and she listened for feedback. SLP also further explained the difference in automatic and intentional speech and provided additional words for Pt to read and identify which rang true for her. She stated that she thinks she needs to "concentrate" and "say it like I mean it" more. She will be going to her great granddaughter's birthday party on Saturday and she was encouraged to "concentrate" and speak with intent during the party. Pt is making good progress toward goals. Continue with lesson 4+ next session.                                                            DATE: 12/09/22  PATIENT EDUCATION: Education details: SPEAK OUT! Therapy workbook Lesson 2+ Person educated: Patient and Child(ren) Education method: Explanation Education comprehension: verbalized understanding  HOME EXERCISE PROGRAM: Pt will completed HEP as assigned to facilitate carryover of speech tasks in home and community environment.   GOALS: Goals reviewed with patient? Yes  SHORT TERM GOALS: Target date: 01/22/23  Pt will coordinate vocal and articulatory subsystems in hierarchical speech tasks by producing sounds with intention with min assistance.  Baseline: mi/mod Goal status: ONGOING  2.  Generalize intentional speech to cognitive-linguistic exercises and conversational speech with improved vocal quality, loudness, articulatory precision, and endurance while maintaining a minimum of 85 dB with min assistance. Baseline: ~78 dB Goal status: ONGOING  3.  Read phrases, sentences, and paragraphs with  intention, yielding improved vocal quality, loudness, articulatory precision, and endurance while maintaining a minimum of 85 dB with min assistance.  Baseline: ~69 dB Goal status: ONGOING  4.  Patient will maintain a maximum phonation duration of 10+ seconds or greater with minimal cues during sustained phonation over 10 trials. Baseline: Met over one trial, reduced with additional repetitions Goal status: ONGOING  LONG TERM GOALS: Target date: 02/26/2023  Pt will speak with intent during spontaneous 1:1 conversations with indirect cues. Baseline: mi/mod Goal status: ONGOING   ASSESSMENT:  CLINICAL IMPRESSION: (from initial evaluation 11/16/2022) Patient is an 82 y.o. female who was seen today for a speech evaluation. 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 She demonstrates adequate vocal intensity ~75% of the time, but has difficulty with pacing and breath support which is negatively impacted by dyskinesia. Pt  participated in SLP therapy in the past and is familiar with increasing vocal intensity to improve speech. Pt with reduced vocal intensity during oral reading and spontaneous conversation (~69 dB) and benefited from cues to use vocal intent and projection. Pt is a good candidate for SPEAK OUT! Therapy and is motivated to address her dysarthria.   OBJECTIVE IMPAIRMENTS: Objective impairments include dysarthria. These impairments are limiting patient from effectively communicating at home and in community.Factors affecting potential to achieve goals and functional outcome are medical prognosis.. Patient will benefit from skilled SLP services to address above impairments and improve overall function.  REHAB POTENTIAL: Good  PLAN:  SLP FREQUENCY: 2x/week  SLP DURATION: other: for 4 weeks and then one additional visit 4-6 weeks after the 8th treatment session  PLANNED INTERVENTIONS: 92507 Treatment of speech (30 or 45 min) , Re-evaluation, Cueing hierachy, Internal/external aids, Functional tasks, and Multimodal communication approach   Thank you,  Havery Moros, CCC-SLP [phone]  Javen Hinderliter, CCC-SLP 12/09/2022, 2:04 PM

## 2022-12-10 ENCOUNTER — Telehealth: Payer: Self-pay | Admitting: Neurology

## 2022-12-10 NOTE — Telephone Encounter (Signed)
Caller stated she received samples last week and was told to get back with dr. Jethro Bolus stated she can not afford medication and she still has some of old medication left. Pt would like to know what to do moving forward about medication.

## 2022-12-13 ENCOUNTER — Other Ambulatory Visit (HOSPITAL_COMMUNITY): Payer: Self-pay

## 2022-12-13 NOTE — Telephone Encounter (Signed)
Called pateint and left message  

## 2022-12-14 ENCOUNTER — Encounter (HOSPITAL_COMMUNITY): Payer: Medicare Other

## 2022-12-14 ENCOUNTER — Ambulatory Visit (HOSPITAL_COMMUNITY): Payer: Medicare Other | Admitting: Speech Pathology

## 2022-12-14 ENCOUNTER — Ambulatory Visit (HOSPITAL_COMMUNITY): Payer: Medicare Other | Admitting: Physical Therapy

## 2022-12-14 DIAGNOSIS — M6281 Muscle weakness (generalized): Secondary | ICD-10-CM | POA: Diagnosis not present

## 2022-12-14 DIAGNOSIS — R262 Difficulty in walking, not elsewhere classified: Secondary | ICD-10-CM

## 2022-12-14 DIAGNOSIS — R490 Dysphonia: Secondary | ICD-10-CM | POA: Diagnosis not present

## 2022-12-14 DIAGNOSIS — G20A1 Parkinson's disease without dyskinesia, without mention of fluctuations: Secondary | ICD-10-CM | POA: Diagnosis not present

## 2022-12-14 DIAGNOSIS — Z9181 History of falling: Secondary | ICD-10-CM

## 2022-12-14 DIAGNOSIS — R471 Dysarthria and anarthria: Secondary | ICD-10-CM | POA: Diagnosis not present

## 2022-12-14 NOTE — Therapy (Signed)
OUTPATIENT PHYSICAL THERAPY TREATMENT Progress Note Reporting Period 11/04/22 to 12/14/22  See note below for Objective Data and Assessment of Progress/Goals.      Patient Name: Megan [name] MRN: [id number] DOB:1940/04/15, 82 y.o., female Today's Date: 12/14/2022  END OF SESSION:  PT End of Session - 12/14/22 1612     Visit Number 10    Number of Visits 12    Date for PT Re-Evaluation 12/16/22    Authorization Type medicare, BCBS Supplement    Progress Note Due on Visit 20    PT Start Time 1435    PT Stop Time 1510    PT Time Calculation (min) 35 min    Equipment Utilized During Treatment Gait belt    Activity Tolerance Patient tolerated treatment well;No increased pain    Behavior During Therapy WFL for tasks assessed/performed                   Past Medical History:  Diagnosis Date   Allergy    SEASONAL   Anemia    Anemia-NOS / PMH, Dr Megan [name]   Arthritis    Breast cancer [facility])    stage I right breast   Cataract    BILATERAL   Decreased vision    R eye   Esophageal Stricture    Hyperlipidemia    Monoclonal gammopathy    MVP (mitral valve prolapse)    Osteoporosis    Parkinson disease (HCC)    Skin cancer    basal cell L neck   Vertical diplopia    Past Surgical History:  Procedure Laterality Date   APPENDECTOMY  2002   BONE BIOPSY  11/06/10   BREAST LUMPECTOMY  02/2010   right breast lumpectomy and sentinel node biopsy   CARDIAC CATHETERIZATION  2005   neg   CATARACT EXTRACTION, BILATERAL  2018   COLONOSCOPY     negative    HEMORRHOID SURGERY  1970's   SALPINGOOPHORECTOMY  2002   for benign growths   SHOULDER SURGERY Right    TOTAL ABDOMINAL HYSTERECTOMY W/ BILATERAL SALPINGOOPHORECTOMY  82 yrs old   for pain,prolapse ; G3 P2   UPPER GASTROINTESTINAL ENDOSCOPY     Patient Active Problem List   Diagnosis Date Noted   Black stools 02/27/2020   Healthcare maintenance 02/05/2019   Mitral valve prolapse 12/14/2018   Pain in right  knee 11/28/2018   Livedo reticularis 09/28/2018   Abdominal pain 02/01/2018   Low back pain 12/29/2017   Edema 11/13/2017   Right arm pain 02/03/2017   Recurrent [name] 02/03/2017   Advance care planning 01/23/2017   Hematuria 07/20/2016   Headache 05/11/2015   Globus sensation 04/24/2015   Shoulder pain 04/17/2014   Parkinson's disease (HCC) 06/05/2013   Medicare annual wellness visit, subsequent 05/02/2012   Neck pain 05/02/2012   Osteoporosis 03/08/2011   Breast cancer (HCC) 11/19/2010   GERD (gastroesophageal reflux disease) 08/01/2008   DYSPHAGIA UNSPECIFIED 08/01/2008   ORTHOSTATIC HYPOTENSION 06/28/2008   HLD (hyperlipidemia) 07/10/2007   MONOCLONAL GAMMOPATHY 07/10/2007   ANEMIA-NOS 07/10/2007   FATIGUE 07/10/2007    PCP: Megan [name]  REFERRING PROVIDER: Vladimir Faster, DO  REFERRING DIAG: Parkinson   THERAPY DIAG:  Difficulty in walking, not elsewhere classified  Muscle weakness (generalized)  History of falling  Rationale for Evaluation and Treatment: Rehabilitation  ONSET DATE: 01/26/2022  SUBJECTIVE:   SUBJECTIVE STATEMENT: PT with visible increase in tremors today and using RW.  Pt reports the medicine she is taking is  not as good as what she was taking but cannot afford the other.  PERTINENT HISTORY: Ms. Megan [name] states that she is falling to much.  She has fallen at least 8 times since April.  She has been using a cane for while.  She also has a walker.  PT states that she has had home health before but did not feel that this helped much. PMH: Parkinson's, OA .  PAIN:  Are you having pain? Back we are not focusing on this, limited activity to not aggravate pt back   PRECAUTIONS: Fall WEIGHT BEARING RESTRICTIONS: No  [name]:  Has patient fallen in last 6 months? Yes. Number of [name] 8  PLOF: Independent with basic ADLs  PATIENT GOALS: to stop falling as much   NEXT MD VISIT: not sure  OBJECTIVE:  Note: Objective measures were completed at  Evaluation unless otherwise noted.   COGNITION: Overall cognitive status: Within functional limits for tasks assessed     SENSATION: WFL  deg  POSTURE: rounded shoulders, decreased lumbar lordosis, and decreased thoracic kyphosis  LOWER EXTREMITY MMT:  MMT Right eval Left eval  Hip flexion 5 5  Hip extension 3 3  Hip abduction 4+  3+  Knee flexion 3+ 3+  Knee extension 4+ 5  Ankle dorsiflexion 3+ 3+   FUNCTIONAL TESTS:  Evaluation: 30 seconds chair stand test:  2 x in 30" 2 minute walk test: with cane 292 ft with knees bend and decreased ankle motion  Single leg stance:  RT: 4"; Lt 55"   12/14/22 30 seconds chair stand test:  10X (was 2 x) in 30" 2 minute walk test:   16' with RW and CGA (pt ran into an object and had several LOB) at eval (was with cane 292 ft with knees bend and decreased ankle motion)  Single leg stance:  Rt 4" max (was RT: 4"; Lt 55")   TODAY'S TREATMENT:                                                                                                                              DATE:  12/14/22 Progress note began; did not complete all test measures Goals reviewed  12/09/23 Standing: Heel raises on incline x 10 Toe raises on decline x 10 Sidestepping in // bars down and back x 3 Sit to stand 2 x 10 no UE assist Standing and rotate trunk with arms out hand clap x 10 each Sidestep and abduct arms to 90 x 10 each side Forward step and flex arms x 10 each SLS 2 each leg; 30" max each no UE assist Marching/ high knees 2 x 10 no UE assist  12/07/22 Against the wall;  Rt arm flexion with LT arm pressing into wall into extension flowing to opposite x 10 Marching high x 10 Opposite arm/leg against the wall x 10   PWR UP, ROCK, TWIST and step Standing , supine, prone and all 4 all x  10  Sit to stand 10 in 30"  PATIENT EDUCATION:  Education details: HEP as below as well as PWR! in sitting position Person educated: Patient Education method:  Explanation, Verbal cues, and Handouts Education comprehension: verbalized understanding and returned demonstration  HOME EXERCISE PROGRAM: Access Code: [email or web address] URL: [email or web address] Date: 11/04/2022 Prepared by: Megan [name]  Exercises - Supine Bridge  - 2 x daily - 7 x weekly - 1 sets - 10 reps - 5 seconds hold - Supine Lower Trunk Rotation  - 2 x daily - 7 x weekly - 1 sets - 10 reps - 3 seconds  hold - Sit to Stand with Armchair  - 2 x daily - 7 x weekly - 1 sets - 10 reps - Prone Hip Extension  - 1 x daily - 7 x weekly - 2 sets - 10 reps - 3 seconds  hold  11/30/22: - Sidelying Hip Abduction  - 2 x daily - 7 x weekly - 2 sets - 10 reps - 5" hold - Quadruped Pelvic Floor Contraction with Opposite Arm and Leg Lift  - 1 x daily - 7 x weekly - 3 sets - 10 reps  ASSESSMENT:  CLINICAL IMPRESSION:  Began with testing of functional measures (see above).  Pt was able to improve her sit to stand abilitiy, however her 2 minute walk was decreased and with increase use of AD as compared to evaluation.  Her single leg stance time is unchanged.  Unable to complete any further testing today per patient request as she was too shaky and unable to control her movements.  Pt requested that speech be cancelled following PT today.  Speech therapist made aware.  Will attempt to complete and discuss continuation of therapy at next session. PT sill continue to benefit from skilled therapy to address deficits.     OBJECTIVE IMPAIRMENTS: Abnormal gait, decreased activity tolerance, decreased balance, decreased mobility, decreased strength, and increased fascial restrictions.   ACTIVITY LIMITATIONS: carrying, standing, and locomotion level  PARTICIPATION LIMITATIONS: shopping and community activity  REAB POTENTIAL: Good  CLINICAL DECISION MAKING: Evolving/moderate complexity  EVALUATION COMPLEXITY: Moderate   GOALS: Goals reviewed with patient? No  SHORT TERM GOALS: Target  date: 11/25/22 PT to be I in HEP in order to improve strength and balance to have not fallen in the past two weeks  Baseline: Goal status:  met    2.  PT to be able to complete 5 sit to stand in a 30 second period.   Baseline:  Goal status:  met   3.  PT to be able to balance on her left leg for 10 seconds to reduce risk of falling  Baseline:  Goal status:  on-going    LONG TERM GOALS: Target date: 12/17/22  PT to be I in an advanced HEP in order to have not had a fall in the past five weeks.  Baseline:  Goal status: on-going   2.  PT LE strength to be increased by one grade to allow pt to come sit to stand from a couch without difficulty  Baseline:  Goal status:  on-going   3.  Pt to be able to single leg stance on her Lt leg for 20 seconds to decrease risk of falling  Baseline:  Goal status:  on-going    PLAN:  PT FREQUENCY: 2x/week  PT DURATION: 6 weeks  PLANNED INTERVENTIONS: 97146- PT Re-evaluation, 97110-Therapeutic exercises, 97530- Therapeutic activity, [id number]- Neuromuscular re-education, 97535- Self Care, and Patient/Family education  PLAN  FOR NEXT SESSION: Complete progress note/test measures as indicated.   4:13 PM, 12/14/22  Lurena Nida, PTA/CLT [facility] Ph: [phone]

## 2022-12-15 NOTE — Telephone Encounter (Signed)
Called patient and left voicemail message.

## 2022-12-16 ENCOUNTER — Encounter (HOSPITAL_COMMUNITY): Payer: Self-pay | Admitting: Speech Pathology

## 2022-12-16 ENCOUNTER — Ambulatory Visit (HOSPITAL_COMMUNITY): Payer: Medicare Other | Admitting: Speech Pathology

## 2022-12-16 ENCOUNTER — Ambulatory Visit (HOSPITAL_COMMUNITY): Payer: Medicare Other | Admitting: Physical Therapy

## 2022-12-16 ENCOUNTER — Encounter (HOSPITAL_COMMUNITY): Payer: Medicare Other | Admitting: Physical Therapy

## 2022-12-16 DIAGNOSIS — R490 Dysphonia: Secondary | ICD-10-CM | POA: Diagnosis not present

## 2022-12-16 DIAGNOSIS — R471 Dysarthria and anarthria: Secondary | ICD-10-CM | POA: Diagnosis not present

## 2022-12-16 DIAGNOSIS — Z9181 History of falling: Secondary | ICD-10-CM | POA: Diagnosis not present

## 2022-12-16 DIAGNOSIS — R262 Difficulty in walking, not elsewhere classified: Secondary | ICD-10-CM

## 2022-12-16 DIAGNOSIS — G20A1 Parkinson's disease without dyskinesia, without mention of fluctuations: Secondary | ICD-10-CM | POA: Diagnosis not present

## 2022-12-16 DIAGNOSIS — M6281 Muscle weakness (generalized): Secondary | ICD-10-CM | POA: Diagnosis not present

## 2022-12-16 NOTE — Telephone Encounter (Signed)
Patient was at speech therapy and the therapist helped to facilitate a call with the patient in order to get more information. Patient felt her movements and overall felt better on Rytary. Patient is taking her CL 25/100 IR again and is having terrible dyskinesia to the point that the check in person at therapy thought she was having a attack or seizure like movements. She is very uncomfortable

## 2022-12-16 NOTE — Therapy (Signed)
OUTPATIENT PHYSICAL THERAPY TREATMENT     Patient Name: [name] MRN: [id number] DOB:Dec 31, 1940, 82 y.o., female Today's Date: 12/16/2022  END OF SESSION:  PT End of Session - 12/16/22 1154     Visit Number 11    Number of Visits 24    Date for PT Re-Evaluation 01/27/23    Authorization Type medicare, BCBS Supplement    Progress Note Due on Visit 20    PT Start Time 1101    PT Stop Time 1133    PT Time Calculation (min) 32 min    Activity Tolerance Patient tolerated treatment well;No increased pain    Behavior During Therapy WFL for tasks assessed/performed                    Past Medical History:  Diagnosis Date   Allergy    SEASONAL   Anemia    Anemia-NOS / PMH, Dr Arbutus Ped   Arthritis    Breast cancer [facility])    stage I right breast   Cataract    BILATERAL   Decreased vision    R eye   Esophageal Stricture    Hyperlipidemia    Monoclonal gammopathy    MVP (mitral valve prolapse)    Osteoporosis    Parkinson disease (HCC)    Skin cancer    basal cell L neck   Vertical diplopia    Past Surgical History:  Procedure Laterality Date   APPENDECTOMY  2002   BONE BIOPSY  11/06/10   BREAST LUMPECTOMY  02/2010   right breast lumpectomy and sentinel node biopsy   CARDIAC CATHETERIZATION  2005   neg   CATARACT EXTRACTION, BILATERAL  2018   COLONOSCOPY     negative    HEMORRHOID SURGERY  1970's   SALPINGOOPHORECTOMY  2002   for benign growths   SHOULDER SURGERY Right    TOTAL ABDOMINAL HYSTERECTOMY W/ BILATERAL SALPINGOOPHORECTOMY  82 yrs old   for pain,prolapse ; G3 P2   UPPER GASTROINTESTINAL ENDOSCOPY     Patient Active Problem List   Diagnosis Date Noted   Black stools 02/27/2020   Healthcare maintenance 02/05/2019   Mitral valve prolapse 12/14/2018   Pain in right knee 11/28/2018   Livedo reticularis 09/28/2018   Abdominal pain 02/01/2018   Low back pain 12/29/2017   Edema 11/13/2017   Right arm pain 02/03/2017   Recurrent falls  02/03/2017   Advance care planning 01/23/2017   Hematuria 07/20/2016   Headache 05/11/2015   Globus sensation 04/24/2015   Shoulder pain 04/17/2014   Parkinson's disease (HCC) 06/05/2013   Medicare annual wellness visit, subsequent 05/02/2012   Neck pain 05/02/2012   Osteoporosis 03/08/2011   Breast cancer (HCC) 11/19/2010   GERD (gastroesophageal reflux disease) 08/01/2008   DYSPHAGIA UNSPECIFIED 08/01/2008   ORTHOSTATIC HYPOTENSION 06/28/2008   HLD (hyperlipidemia) 07/10/2007   MONOCLONAL GAMMOPATHY 07/10/2007   ANEMIA-NOS 07/10/2007   FATIGUE 07/10/2007    PCP: Crawford Givens  REFERRING PROVIDER: Vladimir Faster, DO  REFERRING DIAG: Parkinson   THERAPY DIAG:  Dysarthria and anarthria  Difficulty in walking, not elsewhere classified  Muscle weakness (generalized)  History of falling  Hypokinetic Parkinsonian dysphonia (HCC)  Rationale for Evaluation and Treatment: Rehabilitation  ONSET DATE: 01/26/2022  SUBJECTIVE:   SUBJECTIVE STATEMENT: Pt reports that she is back on her old medication. She had been given  a trial of a new medication to control her tremors which she was on when she started therapy.  She is now  on her old medication as she was unable to afford the new medication that she was given.  Pt states her dyskinetic movement is similar as to prior to the trial medication.  PERTINENT HISTORY: Ms. Spadea states that she is falling to much.  She has fallen at least 8 times since April.  She has been using a cane for while.  She also has a walker.  PT states that she has had home health before but did not feel that this helped much. PMH: Parkinson's, OA .  PAIN:  Are you having pain? Back we are not focusing on this, limited activity to not aggravate pt back   PRECAUTIONS: Fall WEIGHT BEARING RESTRICTIONS: No  FALLS:  Has patient fallen in last 6 months? Yes. Number of falls 8  PLOF: Independent with basic ADLs  PATIENT GOALS: to stop falling as much    NEXT MD VISIT: not sure  OBJECTIVE:  Note: Objective measures were completed at Evaluation unless otherwise noted.   COGNITION: Overall cognitive status: Within functional limits for tasks assessed     SENSATION: WFL  deg  POSTURE: rounded shoulders, decreased lumbar lordosis, and decreased thoracic kyphosis  LOWER EXTREMITY MMT:  MMT Right eval Left eval  Hip flexion 5 5  Hip extension 3 3  Hip abduction 4+  3+  Knee flexion 3+ 3+  Knee extension 4+ 5  Ankle dorsiflexion 3+ 3+   FUNCTIONAL TESTS:  Evaluation: 30 seconds chair stand test:  2 x in 30" 2 minute walk test: with cane 292 ft with knees bend and decreased ankle motion  Single leg stance:  RT: 4"; Lt 55"   12/14/22 30 seconds chair stand test:  10X (was 2 x) in 30" 2 minute walk test:   74' with RW and CGA (pt ran into an object and had several LOB) at eval (was with cane 292 ft with knees bend and decreased ankle motion)  Single leg stance:  Rt 4" max (was RT: 4"; Lt 55")   TODAY'S TREATMENT:                                                                                                                              DATE:  12/16/22 Sitting PWR UP, rock, twist and turn x 10 2 sets Supine PWR UP, rock, twist and turn x 10 Prone PWR UP, rock, twist and turn x 5 Weights were placed on UE to assess is they assisted in decreasing dyskinetic movement of UE which they did not.    PATIENT EDUCATION:  Education details: HEP as below as well as PWR! in sitting position Person educated: Patient Education method: Explanation, Verbal cues, and Handouts Education comprehension: verbalized understanding and returned demonstration  HOME EXERCISE PROGRAM: Access Code: [email or web address] URL: [email or web address] Date: 11/04/2022 Prepared by: Virgina Organ  Exercises - Supine Bridge  - 2 x daily - 7 x weekly - 1 sets - 10 reps - 5 seconds hold -  Supine Lower Trunk Rotation  - 2 x daily - 7 x weekly - 1  sets - 10 reps - 3 seconds  hold - Sit to Stand with Armchair  - 2 x daily - 7 x weekly - 1 sets - 10 reps - Prone Hip Extension  - 1 x daily - 7 x weekly - 2 sets - 10 reps - 3 seconds  hold  11/30/22: - Sidelying Hip Abduction  - 2 x daily - 7 x weekly - 2 sets - 10 reps - 5" hold - Quadruped Pelvic Floor Contraction with Opposite Arm and Leg Lift  - 1 x daily - 7 x weekly - 3 sets - 10 reps  ASSESSMENT:  CLINICAL IMPRESSION:  PT has significant increased dyskinetic motion since stopping trial medication, especially with UE.  Pt had worked with Speech prior to PT who placed a call into pt MD to make MD aware of the vast difference in uncontrolled motion to see if MD can try an alternative medication.  Treatment limited by pt uncontrolled motion  at this time.  Overall pt has improved with PT as initially pt could only come sit to stand 2 x in 30 seconds and now she is completing 10 x.  Pt has increased in assistive device from a cane to a 4 wheeled walker but this was secondary to MD wishes for safety.   PT has a progressive dz and will continue to benefit from skilled therapy to address deficits.     OBJECTIVE IMPAIRMENTS: Abnormal gait, decreased activity tolerance, decreased balance, decreased mobility, decreased strength, and increased fascial restrictions.   ACTIVITY LIMITATIONS: carrying, standing, and locomotion level  PARTICIPATION LIMITATIONS: shopping and community activity  REAB POTENTIAL: Good  CLINICAL DECISION MAKING: Evolving/moderate complexity  EVALUATION COMPLEXITY: Moderate   GOALS: Goals reviewed with patient? No  SHORT TERM GOALS: Target date: 11/25/22 PT to be I in HEP in order to improve strength and balance to have not fallen in the past two weeks  Baseline: Goal status:  met    2.  PT to be able to complete 5 sit to stand in a 30 second period.   Baseline:  Goal status:  met   3.  PT to be able to balance on her left leg for 10 seconds to reduce risk  of falling  Baseline:  Goal status:  on-going    LONG TERM GOALS: Target date: 12/17/22  PT to be I in an advanced HEP in order to have not had a fall in the past five weeks.  Baseline:  Goal status: on-going   2.  PT LE strength to be increased by one grade to allow pt to come sit to stand from a couch without difficulty  Baseline:  Goal status:  on-going   3.  Pt to be able to single leg stance on her Lt leg for 20 seconds to decrease risk of falling  Baseline:  Goal status:  on-going    PLAN:  PT FREQUENCY: 2x/week  PT DURATION: 6 weeks additional 6 weeks   PLANNED INTERVENTIONS: 97146- PT Re-evaluation, 97110-Therapeutic exercises, 97530- Therapeutic activity, [id number]- Neuromuscular re-education, 97535- Self Care, and Patient/Family education  PLAN FOR NEXT SESSION: Complete progress note/test measures as indicated.   11:56 AM, 12/16/22 Virgina Organ, PT CLT [phone]

## 2022-12-16 NOTE — Therapy (Signed)
OUTPATIENT SPEECH LANGUAGE PATHOLOGY PARKINSON'S TREATMENT   Patient Name: [name] MRN: [id number] DOB:05-12-1940, 82 y.o., female Today's Date: 12/16/2022  PCP: Joaquim Nam, MD REFERRING PROVIDER: Vladimir Faster, DO  END OF SESSION:  End of Session - 12/16/22 1020     Visit Number 4    Number of Visits 10    Date for SLP Re-Evaluation 12/30/22    Authorization Type Medicare Primary and BCBS secondary    SLP Start Time 1015    SLP Stop Time  1100    SLP Time Calculation (min) 45 min    Activity Tolerance Patient tolerated treatment well             Past Medical History:  Diagnosis Date   Allergy    SEASONAL   Anemia    Anemia-NOS / PMH, Dr Arbutus Ped   Arthritis    Breast cancer (HCC)    stage I right breast   Cataract    BILATERAL   Decreased vision    R eye   Esophageal Stricture    Hyperlipidemia    Monoclonal gammopathy    MVP (mitral valve prolapse)    Osteoporosis    Parkinson disease (HCC)    Skin cancer    basal cell L neck   Vertical diplopia    Past Surgical History:  Procedure Laterality Date   APPENDECTOMY  2002   BONE BIOPSY  11/06/10   BREAST LUMPECTOMY  02/2010   right breast lumpectomy and sentinel node biopsy   CARDIAC CATHETERIZATION  2005   neg   CATARACT EXTRACTION, BILATERAL  2018   COLONOSCOPY     negative    HEMORRHOID SURGERY  1970's   SALPINGOOPHORECTOMY  2002   for benign growths   SHOULDER SURGERY Right    TOTAL ABDOMINAL HYSTERECTOMY W/ BILATERAL SALPINGOOPHORECTOMY  82 yrs old   for pain,prolapse ; G3 P2   UPPER GASTROINTESTINAL ENDOSCOPY     Patient Active Problem List   Diagnosis Date Noted   Black stools 02/27/2020   Healthcare maintenance 02/05/2019   Mitral valve prolapse 12/14/2018   Pain in right knee 11/28/2018   Livedo reticularis 09/28/2018   Abdominal pain 02/01/2018   Low back pain 12/29/2017   Edema 11/13/2017   Right arm pain 02/03/2017   Recurrent falls 02/03/2017   Advance care  planning 01/23/2017   Hematuria 07/20/2016   Headache 05/11/2015   Globus sensation 04/24/2015   Shoulder pain 04/17/2014   Parkinson's disease (HCC) 06/05/2013   Medicare annual wellness visit, subsequent 05/02/2012   Neck pain 05/02/2012   Osteoporosis 03/08/2011   Breast cancer (HCC) 11/19/2010   GERD (gastroesophageal reflux disease) 08/01/2008   DYSPHAGIA UNSPECIFIED 08/01/2008   ORTHOSTATIC HYPOTENSION 06/28/2008   HLD (hyperlipidemia) 07/10/2007   MONOCLONAL GAMMOPATHY 07/10/2007   ANEMIA-NOS 07/10/2007   FATIGUE 07/10/2007    ONSET DATE: 06/05/2013  REFERRING DIAG: Parkinson's disease  THERAPY DIAG:  Dysarthria and anarthria  Rationale for Evaluation and Treatment: Rehabilitation  SUBJECTIVE:   SUBJECTIVE STATEMENT: "I can't afford the medication, but it helped me (Rytary)."  Pt accompanied by: self and family member  PERTINENT HISTORY: [name] is an 82 yo female who was referred for SLP evaluation and treatment for dysarthria in setting of Parkinson's Disease. Pt indicates she was diagnosed with PD in May of 2015 and did have some SLP therapy the following year in [place]. Pt recently had Xeomin for her sialorrhea.She was referred for speech evaluation by Dr. Lurena Joiner Tat, DO.  PAIN:  Are you having pain? No  FALLS: Has patient fallen in last 6 months?  Yes  PATIENT GOALS: Increase speech intelligibility  OBJECTIVE VOICE ASSESSMENT: Sustained "ah" maximum phonation time: 11 seconds Sustained "ah" loudness average: 82 dB Oral reading (passage) loudness average: 70 dB Oral reading loudness range: 69-72 dB Conversational loudness average: 78 dB Conversational loudness range:  Voice quality: diplophonia and tremor Stimulability trials: Given SLP modeling and occasional min cues, loudness average increased to 92dB (range of 86 to 92) at (loud "ah", word, sentence, paragraph, conversation) level.    Previous Session: Pt was accompanied to therapy by  her daughter-in-law. She brought her SPEAK OUT! Therapy workbook and reported that she completed lessons at home. She completed the 6 exercises: m cycle, sustained /a/, glide, counting, oral reading, and cognitive/conversation with modeling and verbal cues from SLP. She was encouraged to sip on water throughout the session. She completed all exercises with good "intent" and only required min cues for cognitive task and spontaneous conversation. SLP recorded Pt and she listened for feedback. SLP also further explained the difference in automatic and intentional speech and provided additional words for Pt to read and identify which rang true for her. She stated that she thinks she needs to "concentrate" and "say it like I mean it" more. She will be going to her great granddaughter's birthday party on Saturday and she was encouraged to "concentrate" and speak with intent during the party. Pt is making good progress toward goals. Continue with lesson 4+ next       TODAY'S TREATMENT:  Pt accompanied by her daughter-in-law for therapy. She had to cancel her previously scheduled session this week due to severe dyskinesias. She tells me that her symptoms were improved on the Rytary, however she is unable to afford the co-pay and has gone back to her regular medication. Pt has been playing "phone tag" with her neurology office and we created a script to practice in session for her to then call her doctor. She was initially reminded to speak with intent and say it like you mean it. Key words were written down her for her to use in her phone call and she participated in verbal rehearsal with SLP. She completed the phone call with 100% intelligibility. Pt completed SPEAK OUT! Therapy lesson 4 with excellent performance as judged by speaking with intent for all domains. She only required rare min cue throughout the lesson. SLP accompanied Pt to the beginning of her PT session to relay that the neurology office might be  calling. Her volume decreased significantly when speaking with the PT, so Pt was reminded to speak with intent and she self corrected. SLP would like to observe Pt in her PT session to see how she speaks outside of the SLP treatment room. Continue plan of care.                                                         DATE: 12/16/22  PATIENT EDUCATION: Education details: SPEAK OUT! Therapy workbook Lesson 5+ Person educated: Patient and Child(ren) Education method: Explanation Education comprehension: verbalized understanding  HOME EXERCISE PROGRAM: Pt will completed HEP as assigned to facilitate carryover of speech tasks in home and community environment.   GOALS: Goals reviewed with patient? Yes  SHORT TERM GOALS: Target date:  01/22/23  Pt will coordinate vocal and articulatory subsystems in hierarchical speech tasks by producing sounds with intention with min assistance.  Baseline: mi/mod Goal status: ONGOING  2.  Generalize intentional speech to cognitive-linguistic exercises and conversational speech with improved vocal quality, loudness, articulatory precision, and endurance while maintaining a minimum of 85 dB with min assistance. Baseline: ~78 dB Goal status: ONGOING  3.  Read phrases, sentences, and paragraphs with intention, yielding improved vocal quality, loudness, articulatory precision, and endurance while maintaining a minimum of 85 dB with min assistance.  Baseline: ~69 dB Goal status: ONGOING  4.  Patient will maintain a maximum phonation duration of 10+ seconds or greater with minimal cues during sustained phonation over 10 trials. Baseline: Met over one trial, reduced with additional repetitions Goal status: ONGOING  LONG TERM GOALS: Target date: 02/26/2023  Pt will speak with intent during spontaneous 1:1 conversations with indirect cues. Baseline: mi/mod Goal status: ONGOING   ASSESSMENT:  CLINICAL IMPRESSION: (from initial evaluation 11/16/2022) Patient  is an 82 y.o. female who was seen today for a speech evaluation. 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 She demonstrates adequate vocal intensity ~75% of the time, but has difficulty with pacing and breath support which is negatively impacted by dyskinesia. Pt participated in SLP therapy in the past and is familiar with increasing vocal intensity to improve speech. Pt with reduced vocal intensity during oral reading and spontaneous conversation (~69 dB) and benefited from cues to use vocal intent and projection. Pt is a good candidate for SPEAK OUT! Therapy and is motivated to address her dysarthria.   OBJECTIVE IMPAIRMENTS: Objective impairments include dysarthria. These impairments are limiting patient from effectively communicating at home and in community.Factors affecting potential to achieve goals and functional outcome are medical prognosis.. Patient will benefit from skilled SLP services to address above impairments and improve overall function.  REHAB POTENTIAL: Good  PLAN:  SLP FREQUENCY: 2x/week  SLP DURATION: other: for 4 weeks and then one additional visit 4-6 weeks after the 8th treatment session  PLANNED INTERVENTIONS: 92507 Treatment of speech (30 or 45 min) , Re-evaluation, Cueing hierachy, Internal/external aids, Functional tasks, and Multimodal communication approach   Thank you,  Havery Moros, CCC-SLP [phone]  Dante Cooter, CCC-SLP 12/16/2022, 10:21 AM

## 2022-12-17 NOTE — Progress Notes (Unsigned)
Assessment/Plan:    1.  Parkinsons Disease  -Stop immediate release levodopa  -Trial carbidopa/levodopa 25/100 CR, 1.5 tablets at 7 AM/11 AM/3 PM/7 PM.  -continue amantadine 100 mg at 7am and 1 pm for significant dyskinesia.    -needs to use walker at all times.  Discussed with her that cane is not protective.  2.  Sialorrhea  -has not done xeomin since since February 12, 2022.  Not exactly sure what happened, but we will go ahead and get her rescheduled.  3.  RBD  -Continue clonazepam, 0.5 mg, half tablet at bedtime.  RX refilled  -Use bed rails.  4.  Constipation with what sounds like overflow incontinence  -saw GI NP - Treatment with benefiber recommended and helping  5.  Depression  -On venlafaxine.  Subjective:   [name] was seen today in follow up for Parkinsons disease.  My previous records were reviewed prior to todays visit as well as outside records available to me.   We changed her to Rytary.  That helped decrease dyskinesia with similar on time, but unfortunately was too expensive.  She switched back to immediate release and unfortunately ended up having more severe dyskinesias, so much so that she ended up canceling some of her therapies.  Current prescribed movement disorder medications: carbidopa/levodopa 25/100, 1 tablet at 7 AM/ 1/2 tablet at 9, 1/2 tablet at 11 AM/1/2 tablet at 1pm/1/2 tablet at 3 PM/1/2 tablet at 5pm/ 1/2 tablet at 7 PM  Amantadine, 100 mg at 7 AM/1 PM  Clonazepam 0.5 mg, half tablet at bed   PREVIOUS MEDICATIONS: has clonazepam but rarely takes; Rytary (helped to decrease dyskinesia and had same on time, but was too costly)  ALLERGIES:   Allergies  Allergen Reactions   Penicillins Shortness Of Breath and Itching    CURRENT MEDICATIONS:  Outpatient Encounter Medications as of 12/20/2022  Medication Sig   amantadine (SYMMETREL) 100 MG capsule TAKE ONE CAPSULE BY MOUTH TWICE DAILY AT 7am AND 1pm   b complex vitamins tablet  Take 1 tablet by mouth daily.   Calcium 1500 MG tablet Take 1,500 mg by mouth.   Carbidopa-Levodopa ER (RYTARY) 36.25-145 MG CPCR 1 capsule 4 times per day at 7am/11am/3pm/7pm   celecoxib (CELEBREX) 200 MG capsule Take 1 capsule (200 mg total) by mouth daily.   Cholecalciferol 5000 units TABS Take 1 tablet (5,000 Units total) by mouth daily.   clonazePAM (KLONOPIN) 0.5 MG tablet TAKE 1/2 TABLET BY MOUTH AT BEDTIME   Denosumab (PROLIA [place]) Inject into the skin every 6 (six) months.   Elastic Bandages & Supports (ABDOMINAL BINDER/ELASTIC SMALL) MISC 1 Device by Does not apply route daily.   Multiple Vitamin (MULTIVITAMIN) capsule 2 tabs po qd   Omega-3 Fatty Acids (OMEGA 3 PO) 1 tab po qd   omeprazole (PRILOSEC) 20 MG capsule TAKE (1) CAPSULE BY MOUTH ONCE DAILY.   OnabotulinumtoxinA (BOTOX IM) Inject into the muscle every 3 (three) months. Injection every 3 months   venlafaxine XR (EFFEXOR-XR) 37.5 MG 24 hr capsule Take 37.5 mg by mouth daily with breakfast.   venlafaxine XR (EFFEXOR-XR) 75 MG 24 hr capsule Take 75 mg by mouth daily.   vitamin B-12 (CYANOCOBALAMIN) 500 MCG tablet Take 2500 mcg daily.   Facility-Administered Encounter Medications as of 12/20/2022  Medication   incobotulinumtoxinA (XEOMIN) 100 units injection 100 Units   incobotulinumtoxinA (XEOMIN) 100 units injection 100 Units    Objective:   PHYSICAL EXAMINATION:    VITALS:  There were no vitals filed for this visit.   GEN:  The patient appears stated age and is in NAD. HEENT:  Normocephalic, atraumatic.  The mucous membranes are moist. The superficial temporal arteries are without ropiness or tenderness.  Neurological examination:  Orientation: The patient is alert and oriented x3. Cranial nerves: There is good facial symmetry with facial hypomimia. There is R esotropia but it fluctuates (denies diplopia).  The speech is fluent and hypophonic. Soft palate rises symmetrically and there is no tongue deviation.  Hearing is intact to conversational tone. Sensation: Sensation is intact to light touch throughout Motor: Strength is at least antigravity x4.  Movement examination: Tone: There is nl tone in the UE/LE Abnormal movements: she has LUE rest tremor, mild.  Rare RLE foot tremor  No dyskinesia Coordination:  There is mild to mod decremation with all RAMs bilaterally Gait and Station: The patient pushes off to arise.  She ambulates with her 4 pronged cane but she is a bit unsteady  Follow up Instructions      -I discussed the assessment and treatment plan with the patient. The patient was provided an opportunity to ask questions and all were answered. The patient agreed with the plan and demonstrated an understanding of the instructions.   The patient was advised to call back or seek an in-person evaluation if the symptoms worsen or if the condition fails to improve as anticipated.    Total time spent on today's visit was ***minutes, including both face-to-face time and nonface-to-face time.  Time included that spent on review of records (prior notes available to me/labs/imaging if pertinent), discussing treatment and goals, answering patient's questions and coordinating care.   Kerin Salen, DO   Cc:  Joaquim Nam, MD

## 2022-12-17 NOTE — Telephone Encounter (Signed)
Since it doesn't appear there are PAN funds and cannot afford rytary, can you just put her on for a quick visit on Monday to discuss.  Video or in person is okay

## 2022-12-20 ENCOUNTER — Telehealth (INDEPENDENT_AMBULATORY_CARE_PROVIDER_SITE_OTHER): Payer: Medicare Other | Admitting: Neurology

## 2022-12-20 DIAGNOSIS — G20B2 Parkinson's disease with dyskinesia, with fluctuations: Secondary | ICD-10-CM | POA: Diagnosis not present

## 2022-12-20 MED ORDER — CARBIDOPA-LEVODOPA ER 25-100 MG PO TBCR: EXTENDED_RELEASE_TABLET | ORAL | 5 refills | Status: DC

## 2022-12-21 ENCOUNTER — Encounter (HOSPITAL_COMMUNITY): Payer: Self-pay | Admitting: Speech Pathology

## 2022-12-21 ENCOUNTER — Ambulatory Visit (HOSPITAL_COMMUNITY): Payer: Medicare Other | Admitting: Speech Pathology

## 2022-12-21 ENCOUNTER — Ambulatory Visit (HOSPITAL_COMMUNITY): Payer: Medicare Other | Admitting: Physical Therapy

## 2022-12-21 DIAGNOSIS — M6281 Muscle weakness (generalized): Secondary | ICD-10-CM | POA: Diagnosis not present

## 2022-12-21 DIAGNOSIS — R262 Difficulty in walking, not elsewhere classified: Secondary | ICD-10-CM

## 2022-12-21 DIAGNOSIS — R471 Dysarthria and anarthria: Secondary | ICD-10-CM

## 2022-12-21 DIAGNOSIS — G20A1 Parkinson's disease without dyskinesia, without mention of fluctuations: Secondary | ICD-10-CM | POA: Diagnosis not present

## 2022-12-21 DIAGNOSIS — R490 Dysphonia: Secondary | ICD-10-CM | POA: Diagnosis not present

## 2022-12-21 DIAGNOSIS — Z9181 History of falling: Secondary | ICD-10-CM | POA: Diagnosis not present

## 2022-12-21 NOTE — Therapy (Signed)
OUTPATIENT SPEECH LANGUAGE PATHOLOGY PARKINSON'S TREATMENT   Patient Name: [name] MRN: [id number] DOB:07-09-1940, 82 y.o., female Today's Date: 12/21/2022  PCP: Joaquim Nam, MD REFERRING PROVIDER: Vladimir Faster, DO  END OF SESSION:  End of Session - 12/21/22 1429     Visit Number 5    Number of Visits 10    Date for SLP Re-Evaluation 12/30/22    Authorization Type Medicare Primary and BCBS secondary    SLP Start Time 1430    SLP Stop Time  1515    SLP Time Calculation (min) 45 min    Activity Tolerance Patient tolerated treatment well             Past Medical History:  Diagnosis Date   Allergy    SEASONAL   Anemia    Anemia-NOS / PMH, Dr Arbutus Ped   Arthritis    Breast cancer (HCC)    stage I right breast   Cataract    BILATERAL   Decreased vision    R eye   Esophageal Stricture    Hyperlipidemia    Monoclonal gammopathy    MVP (mitral valve prolapse)    Osteoporosis    Parkinson disease (HCC)    Skin cancer    basal cell L neck   Vertical diplopia    Past Surgical History:  Procedure Laterality Date   APPENDECTOMY  2002   BONE BIOPSY  11/06/10   BREAST LUMPECTOMY  02/2010   right breast lumpectomy and sentinel node biopsy   CARDIAC CATHETERIZATION  2005   neg   CATARACT EXTRACTION, BILATERAL  2018   COLONOSCOPY     negative    HEMORRHOID SURGERY  1970's   SALPINGOOPHORECTOMY  2002   for benign growths   SHOULDER SURGERY Right    TOTAL ABDOMINAL HYSTERECTOMY W/ BILATERAL SALPINGOOPHORECTOMY  82 yrs old   for pain,prolapse ; G3 P2   UPPER GASTROINTESTINAL ENDOSCOPY     Patient Active Problem List   Diagnosis Date Noted   Black stools 02/27/2020   Healthcare maintenance 02/05/2019   Mitral valve prolapse 12/14/2018   Pain in right knee 11/28/2018   Livedo reticularis 09/28/2018   Abdominal pain 02/01/2018   Low back pain 12/29/2017   Edema 11/13/2017   Right arm pain 02/03/2017   Recurrent falls 02/03/2017   Advance care  planning 01/23/2017   Hematuria 07/20/2016   Headache 05/11/2015   Globus sensation 04/24/2015   Shoulder pain 04/17/2014   Parkinson's disease (HCC) 06/05/2013   Medicare annual wellness visit, subsequent 05/02/2012   Neck pain 05/02/2012   Osteoporosis 03/08/2011   Breast cancer (HCC) 11/19/2010   GERD (gastroesophageal reflux disease) 08/01/2008   DYSPHAGIA UNSPECIFIED 08/01/2008   ORTHOSTATIC HYPOTENSION 06/28/2008   HLD (hyperlipidemia) 07/10/2007   MONOCLONAL GAMMOPATHY 07/10/2007   ANEMIA-NOS 07/10/2007   FATIGUE 07/10/2007    ONSET DATE: 06/05/2013  REFERRING DIAG: Parkinson's disease  THERAPY DIAG:  Dysarthria and anarthria  Rationale for Evaluation and Treatment: Rehabilitation  SUBJECTIVE:   SUBJECTIVE STATEMENT: "I had a virtual visit with Dr. Arbutus Leas yesterday."  Pt accompanied by: self and family member  PERTINENT HISTORY: [name] is an 82 yo female who was referred for SLP evaluation and treatment for dysarthria in setting of Parkinson's Disease. Pt indicates she was diagnosed with PD in May of 2015 and did have some SLP therapy the following year in [place]. Pt recently had Xeomin for her sialorrhea.She was referred for speech evaluation by Dr. Lurena Joiner Tat, DO.  PAIN:  Are you having pain? No  FALLS: Has patient fallen in last 6 months?  Yes  PATIENT GOALS: Increase speech intelligibility  OBJECTIVE VOICE ASSESSMENT: Sustained "ah" maximum phonation time: 11 seconds Sustained "ah" loudness average: 82 dB Oral reading (passage) loudness average: 70 dB Oral reading loudness range: 69-72 dB Conversational loudness average: 78 dB Conversational loudness range:  Voice quality: diplophonia and tremor Stimulability trials: Given SLP modeling and occasional min cues, loudness average increased to 92dB (range of 86 to 92) at (loud "ah", word, sentence, paragraph, conversation) level.    Previous Session:  Pt accompanied by her daughter-in-law for  therapy. She had to cancel her previously scheduled session this week due to severe dyskinesias. She tells me that her symptoms were improved on the [place], however she is unable to afford the co-pay and has gone back to her regular medication. Pt has been playing "phone tag" with her neurology office and we created a script to practice in session for her to then call her doctor. She was initially reminded to speak with intent and say it like you mean it. Key words were written down her for her to use in her phone call and she participated in verbal rehearsal with SLP. She completed the phone call with 100% intelligibility. Pt completed SPEAK OUT! Therapy lesson 4 with excellent performance as judged by speaking with intent for all domains. She only required rare min cue throughout the lesson. SLP accompanied Pt to the beginning of her PT session to relay that the neurology office might be calling. Her volume decreased significantly when speaking with the PT, so Pt was reminded to speak with intent and she self corrected. SLP would like to observe Pt in her PT session to see how she speaks outside of the SLP treatment room. Continue plan of care.     TODAY'S TREATMENT: Pt accompanied by her daughter-in-law to therapy. She indicates she was unable to practice her SPEAK OUT! Therapy exercises due to feeling stressed about her car not working properly. SLP emphasized the importance of incorporating her exercises twice daily to help battle effects of PD. In session, she completed lesson 5 with min to mi/mod cues from SLP. She required more assistance for accuracy of task completion, likely due to not having completed lessons since last Thursday. When she initially entered treatment room, she was speaking at soft volume and SLP provided cue for "intent" and she immediately improved vocal intensity. Pt stated that she met with Dr. Arbutus Leas for a virtual visit yesterday and she is changing her medications to an extended  release. SLP asked if she thought about speaking with intent for her appointment and she stated that she "didn't even think about it". Pt does an excellent job using "intent" once she is reminded. Continue plan of care.                                                          DATE: 12/21/22  PATIENT EDUCATION: Education details: SPEAK OUT! Therapy workbook Lesson 6+ Person educated: Patient and Child(ren) Education method: Explanation Education comprehension: verbalized understanding  HOME EXERCISE PROGRAM: Pt will completed HEP as assigned to facilitate carryover of speech tasks in home and community environment.   GOALS: Goals reviewed with patient? Yes  SHORT TERM GOALS: Target date: 01/22/23  Pt will coordinate vocal and articulatory subsystems in hierarchical speech tasks by producing sounds with intention with min assistance.  Baseline: mi/mod Goal status: ONGOING  2.  Generalize intentional speech to cognitive-linguistic exercises and conversational speech with improved vocal quality, loudness, articulatory precision, and endurance while maintaining a minimum of 85 dB with min assistance. Baseline: ~78 dB Goal status: ONGOING  3.  Read phrases, sentences, and paragraphs with intention, yielding improved vocal quality, loudness, articulatory precision, and endurance while maintaining a minimum of 85 dB with min assistance.  Baseline: ~69 dB Goal status: ONGOING  4.  Patient will maintain a maximum phonation duration of 10+ seconds or greater with minimal cues during sustained phonation over 10 trials. Baseline: Met over one trial, reduced with additional repetitions Goal status: ONGOING  LONG TERM GOALS: Target date: 02/26/2023  Pt will speak with intent during spontaneous 1:1 conversations with indirect cues. Baseline: mi/mod Goal status: ONGOING   ASSESSMENT:  CLINICAL IMPRESSION: (from initial evaluation 11/16/2022) Patient is an 82 y.o. female who was seen today  for a speech evaluation. 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 She demonstrates adequate vocal intensity ~75% of the time, but has difficulty with pacing and breath support which is negatively impacted by dyskinesia. Pt participated in SLP therapy in the past and is familiar with increasing vocal intensity to improve speech. Pt with reduced vocal intensity during oral reading and spontaneous conversation (~69 dB) and benefited from cues to use vocal intent and projection. Pt is a good candidate for SPEAK OUT! Therapy and is motivated to address her dysarthria.   OBJECTIVE IMPAIRMENTS: Objective impairments include dysarthria. These impairments are limiting patient from effectively communicating at home and in community.Factors affecting potential to achieve goals and functional outcome are medical prognosis.. Patient will benefit from skilled SLP services to address above impairments and improve overall function.  REHAB POTENTIAL: Good  PLAN:  SLP FREQUENCY: 2x/week  SLP DURATION: other: for 4 weeks and then one additional visit 4-6 weeks after the 8th treatment session  PLANNED INTERVENTIONS: 92507 Treatment of speech (30 or 45 min) , Re-evaluation, Cueing hierachy, Internal/external aids, Functional tasks, and Multimodal communication approach   Thank you,  Havery Moros, CCC-SLP [phone]  Averianna Brugger, CCC-SLP 12/21/2022, 2:29 PM

## 2022-12-21 NOTE — Therapy (Signed)
OUTPATIENT PHYSICAL THERAPY TREATMENT     Patient Name: [name] MRN: [id number] DOB:Nov 01, 1940, 82 y.o., female Today's Date: 12/21/2022  END OF SESSION:  PT End of Session - 12/21/22 1559     Visit Number 12    Number of Visits 24    Date for PT Re-Evaluation 01/27/23    Authorization Type medicare, BCBS Supplement    Progress Note Due on Visit 20    PT Start Time 1520    PT Stop Time 1600    PT Time Calculation (min) 40 min    Activity Tolerance Patient tolerated treatment well;No increased pain    Behavior During Therapy WFL for tasks assessed/performed                     Past Medical History:  Diagnosis Date   Allergy    SEASONAL   Anemia    Anemia-NOS / PMH, Dr Arbutus Ped   Arthritis    Breast cancer [facility])    stage I right breast   Cataract    BILATERAL   Decreased vision    R eye   Esophageal Stricture    Hyperlipidemia    Monoclonal gammopathy    MVP (mitral valve prolapse)    Osteoporosis    Parkinson disease (HCC)    Skin cancer    basal cell L neck   Vertical diplopia    Past Surgical History:  Procedure Laterality Date   APPENDECTOMY  2002   BONE BIOPSY  11/06/10   BREAST LUMPECTOMY  02/2010   right breast lumpectomy and sentinel node biopsy   CARDIAC CATHETERIZATION  2005   neg   CATARACT EXTRACTION, BILATERAL  2018   COLONOSCOPY     negative    HEMORRHOID SURGERY  1970's   SALPINGOOPHORECTOMY  2002   for benign growths   SHOULDER SURGERY Right    TOTAL ABDOMINAL HYSTERECTOMY W/ BILATERAL SALPINGOOPHORECTOMY  82 yrs old   for pain,prolapse ; G3 P2   UPPER GASTROINTESTINAL ENDOSCOPY     Patient Active Problem List   Diagnosis Date Noted   Black stools 02/27/2020   Healthcare maintenance 02/05/2019   Mitral valve prolapse 12/14/2018   Pain in right knee 11/28/2018   Livedo reticularis 09/28/2018   Abdominal pain 02/01/2018   Low back pain 12/29/2017   Edema 11/13/2017   Right arm pain 02/03/2017   Recurrent  falls 02/03/2017   Advance care planning 01/23/2017   Hematuria 07/20/2016   Headache 05/11/2015   Globus sensation 04/24/2015   Shoulder pain 04/17/2014   Parkinson's disease (HCC) 06/05/2013   Medicare annual wellness visit, subsequent 05/02/2012   Neck pain 05/02/2012   Osteoporosis 03/08/2011   Breast cancer (HCC) 11/19/2010   GERD (gastroesophageal reflux disease) 08/01/2008   DYSPHAGIA UNSPECIFIED 08/01/2008   ORTHOSTATIC HYPOTENSION 06/28/2008   HLD (hyperlipidemia) 07/10/2007   MONOCLONAL GAMMOPATHY 07/10/2007   ANEMIA-NOS 07/10/2007   FATIGUE 07/10/2007    PCP: Crawford Givens  REFERRING PROVIDER: Vladimir Faster, DO  REFERRING DIAG: Parkinson   THERAPY DIAG:  Difficulty in walking, not elsewhere classified  Muscle weakness (generalized)  History of falling  Rationale for Evaluation and Treatment: Rehabilitation  ONSET DATE: 01/26/2022  SUBJECTIVE:   SUBJECTIVE STATEMENT:  Pt states that her Rt knee is hurting when she has wt on it.  States pain goes as high as a 10/10  PERTINENT HISTORY:  Parkinson's.  PAIN:  Are you having pain? Back we are not focusing on this, limited activity  to not aggravate pt back   PRECAUTIONS: Fall WEIGHT BEARING RESTRICTIONS: No  FALLS:  Has patient fallen in last 6 months? Yes. Number of falls 8  PLOF: Independent with basic ADLs  PATIENT GOALS: to stop falling as much   NEXT MD VISIT: not sure  OBJECTIVE:  Note: Objective measures were completed at Evaluation unless otherwise noted.   COGNITION: Overall cognitive status: Within functional limits for tasks assessed     SENSATION: WFL  deg  POSTURE: rounded shoulders, decreased lumbar lordosis, and decreased thoracic kyphosis  LOWER EXTREMITY MMT:  MMT Right eval Left eval  Hip flexion 5 5  Hip extension 3 3  Hip abduction 4+  3+  Knee flexion 3+ 3+  Knee extension 4+ 5  Ankle dorsiflexion 3+ 3+   FUNCTIONAL TESTS:  Evaluation: 30 seconds chair stand  test:  2 x in 30" 2 minute walk test: with cane 292 ft with knees bend and decreased ankle motion  Single leg stance:  RT: 4"; Lt 55"   12/14/22 30 seconds chair stand test:  10X (was 2 x) in 30" 2 minute walk test:   20' with RW and CGA (pt ran into an object and had several LOB) at eval (was with cane 292 ft with knees bend and decreased ankle motion)  Single leg stance:  Rt 4" max (was RT: 4"; Lt 55")   TODAY'S TREATMENT:                                                                                                                              DATE:  12/21/22 Standing :  Big walk down hall forward and side ways  Sitting PWR UP, rock, twist and turn x 10 2 sets Supine PWR UP, rock, twist and turn x 10 Prone PWR UP, rock, twist and turn x 5 Standing PWR up, rock than complained of knee pain PWR! in sitting position Nustep level 3 x 5 minutes   Person educated: Patient Education method: Explanation, Verbal cues, and Handouts Education comprehension: verbalized understanding and returned demonstration  HOME EXERCISE PROGRAM: Access Code: [email or web address] URL: [email or web address] Date: 11/04/2022 Prepared by: Virgina Organ  Exercises - Supine Bridge  - 2 x daily - 7 x weekly - 1 sets - 10 reps - 5 seconds hold - Supine Lower Trunk Rotation  - 2 x daily - 7 x weekly - 1 sets - 10 reps - 3 seconds  hold - Sit to Stand with Armchair  - 2 x daily - 7 x weekly - 1 sets - 10 reps - Prone Hip Extension  - 1 x daily - 7 x weekly - 2 sets - 10 reps - 3 seconds  hold  11/30/22: - Sidelying Hip Abduction  - 2 x daily - 7 x weekly - 2 sets - 10 reps - 5" hold - Quadruped Pelvic Floor Contraction with Opposite Arm and Leg Lift  - 1 x daily - 7 x  weekly - 3 sets - 10 reps  ASSESSMENT:  CLINICAL IMPRESSION:  Pt medication has been changed and kinesia is much better.  Continued with POWER but limited in standing due to knee pain.  PT has a progressive dz and will continue to  benefit from skilled therapy to address deficits.     OBJECTIVE IMPAIRMENTS: Abnormal gait, decreased activity tolerance, decreased balance, decreased mobility, decreased strength, and increased fascial restrictions.   ACTIVITY LIMITATIONS: carrying, standing, and locomotion level  PARTICIPATION LIMITATIONS: shopping and community activity  REAB POTENTIAL: Good  CLINICAL DECISION MAKING: Evolving/moderate complexity  EVALUATION COMPLEXITY: Moderate   GOALS: Goals reviewed with patient? No  SHORT TERM GOALS: Target date: 11/25/22 PT to be I in HEP in order to improve strength and balance to have not fallen in the past two weeks  Baseline: Goal status:  met    2.  PT to be able to complete 5 sit to stand in a 30 second period.   Baseline:  Goal status:  met   3.  PT to be able to balance on her left leg for 10 seconds to reduce risk of falling  Baseline:  Goal status:  on-going    LONG TERM GOALS: Target date: 12/17/22  PT to be I in an advanced HEP in order to have not had a fall in the past five weeks.  Baseline:  Goal status: on-going   2.  PT LE strength to be increased by one grade to allow pt to come sit to stand from a couch without difficulty  Baseline:  Goal status:  on-going   3.  Pt to be able to single leg stance on her Lt leg for 20 seconds to decrease risk of falling  Baseline:  Goal status:  on-going    PLAN:  PT FREQUENCY: 2x/week  PT DURATION: 6 weeks additional 6 weeks   PLANNED INTERVENTIONS: 97146- PT Re-evaluation, 97110-Therapeutic exercises, 97530- Therapeutic activity, [id number]- Neuromuscular re-education, 97535- Self Care, and Patient/Family education  PLAN FOR NEXT SESSION: Complete progress note/test measures as indicated.  Virgina Organ, PT CLT [phone]

## 2022-12-28 ENCOUNTER — Encounter (HOSPITAL_COMMUNITY): Payer: Self-pay | Admitting: Speech Pathology

## 2022-12-28 ENCOUNTER — Ambulatory Visit (HOSPITAL_COMMUNITY): Payer: Medicare Other | Attending: Neurology | Admitting: Speech Pathology

## 2022-12-28 DIAGNOSIS — R471 Dysarthria and anarthria: Secondary | ICD-10-CM | POA: Diagnosis not present

## 2022-12-28 DIAGNOSIS — M6281 Muscle weakness (generalized): Secondary | ICD-10-CM | POA: Diagnosis not present

## 2022-12-28 DIAGNOSIS — R262 Difficulty in walking, not elsewhere classified: Secondary | ICD-10-CM | POA: Insufficient documentation

## 2022-12-28 DIAGNOSIS — Z9181 History of falling: Secondary | ICD-10-CM | POA: Insufficient documentation

## 2022-12-28 NOTE — Therapy (Signed)
OUTPATIENT SPEECH LANGUAGE PATHOLOGY [facility]   Patient Name: [name] MRN: [id number] DOB:[birth year], 82 y.o., female Today's Date: 12/28/2022  PCP: Joaquim Nam, MD REFERRING PROVIDER: Vladimir Faster, DO  END OF SESSION:  End of Session - 12/28/22 1457     Visit Number 6    Number of Visits 10    Date for SLP Re-Evaluation 12/30/22    Authorization Type Medicare Primary and BCBS secondary    SLP Start Time 1430    SLP Stop Time  1515    SLP Time Calculation (min) 45 min    Activity Tolerance Patient tolerated treatment well             Past Medical History:  Diagnosis Date   Allergy    SEASONAL   Anemia    Anemia-NOS / PMH, Dr Arbutus Ped   Arthritis    Breast cancer (HCC)    stage I right breast   Cataract    BILATERAL   Decreased vision    R eye   Esophageal Stricture    Hyperlipidemia    Monoclonal gammopathy    MVP (mitral valve prolapse)    Osteoporosis    Parkinson disease (HCC)    Skin cancer    basal cell L neck   Vertical diplopia    Past Surgical History:  Procedure Laterality Date   APPENDECTOMY  2002   BONE BIOPSY  11/06/10   BREAST LUMPECTOMY  02/2010   right breast lumpectomy and sentinel node biopsy   CARDIAC CATHETERIZATION  2005   neg   CATARACT EXTRACTION, BILATERAL  2018   COLONOSCOPY     negative    HEMORRHOID SURGERY  1970's   SALPINGOOPHORECTOMY  2002   for benign growths   SHOULDER SURGERY Right    TOTAL ABDOMINAL HYSTERECTOMY W/ BILATERAL SALPINGOOPHORECTOMY  82 yrs old   for pain,prolapse ; G3 P2   UPPER GASTROINTESTINAL ENDOSCOPY     Patient Active Problem List   Diagnosis Date Noted   Black stools 02/27/2020   Healthcare maintenance 02/05/2019   Mitral valve prolapse 12/14/2018   Pain in right knee 11/28/2018   Livedo reticularis 09/28/2018   Abdominal pain 02/01/2018   Low back pain 12/29/2017   Edema 11/13/2017   Right arm pain 02/03/2017   Recurrent falls 02/03/2017   Advance care  planning 01/23/2017   Hematuria 07/20/2016   Headache 05/11/2015   Globus sensation 04/24/2015   Shoulder pain 04/17/2014   Parkinson's disease (HCC) 06/05/2013   Medicare annual wellness visit, subsequent 05/02/2012   Neck pain 05/02/2012   Osteoporosis 03/08/2011   Breast cancer (HCC) 11/19/2010   GERD (gastroesophageal reflux disease) 08/01/2008   DYSPHAGIA UNSPECIFIED 08/01/2008   ORTHOSTATIC HYPOTENSION 06/28/2008   HLD (hyperlipidemia) 07/10/2007   MONOCLONAL GAMMOPATHY 07/10/2007   ANEMIA-NOS 07/10/2007   FATIGUE 07/10/2007    ONSET DATE: 06/05/2013  REFERRING DIAG: Parkinson's disease  THERAPY DIAG:  Dysarthria and anarthria  Rationale for Evaluation and Treatment: Rehabilitation  SUBJECTIVE:   SUBJECTIVE STATEMENT: "I do them about once a day."  Pt accompanied by: self and family member  PERTINENT HISTORY: [name] is an 82 yo female who was referred for SLP evaluation and treatment for dysarthria in setting of Parkinson's Disease. Pt indicates she was diagnosed with PD in May of 2015 and did have some SLP therapy the following year in [place]. Pt recently had Xeomin for her sialorrhea.She was referred for speech evaluation by Dr. Lurena Joiner Tat, DO.   PAIN:  Are you having pain? No  FALLS: Has patient fallen in last 6 months?  Yes  PATIENT GOALS: Increase speech intelligibility  OBJECTIVE VOICE ASSESSMENT: Sustained "ah" maximum phonation time: 11 seconds Sustained "ah" loudness average: 82 dB Oral reading (passage) loudness average: 70 dB Oral reading loudness range: 69-72 dB Conversational loudness average: 78 dB Conversational loudness range:  Voice quality: diplophonia and tremor Stimulability trials: Given SLP modeling and occasional min cues, loudness average increased to 92dB (range of 86 to 92) at (loud "ah", word, sentence, paragraph, conversation) level.    Previous Session:  Pt accompanied by her daughter-in-law to therapy. She  indicates she was unable to practice her SPEAK OUT! Therapy exercises due to feeling stressed about her car not working properly. SLP emphasized the importance of incorporating her exercises twice daily to help battle effects of PD. In session, she completed lesson 5 with min to mi/mod cues from SLP. She required more assistance for accuracy of task completion, likely due to not having completed lessons since last Thursday. When she initially entered treatment room, she was speaking at soft volume and SLP provided cue for "intent" and she immediately improved vocal intensity. Pt stated that she met with Dr. Arbutus Leas for a virtual visit yesterday and she is changing her medications to an extended release. SLP asked if she thought about speaking with intent for her appointment and she stated that she "didn't even think about it". Pt does an excellent job using "intent" once she is reminded. Continue plan of care.     TODAY'S TREATMENT: Pt accompanied to therapy by her daughter-in-law. She forgot to bring her water and her workbook. When asked how twice daily practice was going at home, she stated that she tried to "at least do once per day". She said that she is occasionally at her boyfriend's house and does not have her workbook. Last session, SLP created a cheat sheet with streamlined cues to complete her practice and was given two sheets to keep one at the other house. She did not take it to his house yet. SLP facilitated session by completion of Lesson 6. SLP timed the first 5 tasks (m-cycle, /a/ sustained, pitch glide, counting, and reading sentences) and it took only 13 minutes. Pt then stated that she thought she could make time to do that at home twice per day. She required min reminders for intent throughout targeted exercises and again before completing the cognitive task. She easily could spell words backwards aloud and appeared pleased that she could do this (and with great intent!). Pt with fairly  significant left arm movement throughout the session today (medications recently changed). Continue plan of care.                                                           DATE: 12/28/22  PATIENT EDUCATION: Education details: SPEAK OUT! Therapy workbook Lesson 6+ Person educated: Patient and Child(ren) Education method: Explanation Education comprehension: verbalized understanding  HOME EXERCISE PROGRAM: Pt will completed HEP as assigned to facilitate carryover of speech tasks in home and community environment.   GOALS: Goals reviewed with patient? Yes  SHORT TERM GOALS: Target date: 01/22/23  Pt will coordinate vocal and articulatory subsystems in hierarchical speech tasks by producing sounds with intention with min assistance.  Baseline: mi/mod Goal  status: ONGOING  2.  Generalize intentional speech to cognitive-linguistic exercises and conversational speech with improved vocal quality, loudness, articulatory precision, and endurance while maintaining a minimum of 85 dB with min assistance. Baseline: ~78 dB Goal status: ONGOING  3.  Read phrases, sentences, and paragraphs with intention, yielding improved vocal quality, loudness, articulatory precision, and endurance while maintaining a minimum of 85 dB with min assistance.  Baseline: ~69 dB Goal status: ONGOING  4.  Patient will maintain a maximum phonation duration of 10+ seconds or greater with minimal cues during sustained phonation over 10 trials. Baseline: Met over one trial, reduced with additional repetitions Goal status: ONGOING  LONG TERM GOALS: Target date: 02/26/2023  Pt will speak with intent during spontaneous 1:1 conversations with indirect cues. Baseline: mi/mod Goal status: ONGOING   ASSESSMENT:  CLINICAL IMPRESSION: (from initial evaluation 11/16/2022) Patient is an 82 y.o. female who was seen today for a speech evaluation. 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 She demonstrates adequate vocal intensity ~75% of the time, but has difficulty with pacing and breath support which is negatively impacted by dyskinesia. Pt participated in SLP therapy in the past and is familiar with increasing vocal intensity to improve speech. Pt with reduced vocal intensity during oral reading and spontaneous conversation (~69 dB) and benefited from cues to use vocal intent and projection. Pt is a good candidate for SPEAK OUT! Therapy and is motivated to address her dysarthria.   OBJECTIVE IMPAIRMENTS: Objective impairments include dysarthria. These impairments are limiting patient from effectively communicating at home and in community.Factors affecting potential to achieve goals and functional outcome are medical prognosis.. Patient will benefit from skilled SLP services to address above impairments and improve overall function.  REHAB POTENTIAL: Good  PLAN:  SLP FREQUENCY: 2x/week  SLP DURATION: other: for 4 weeks and then one additional visit 4-6 weeks after the 8th treatment session  PLANNED INTERVENTIONS: 92507 Treatment of speech (30 or 45 min) , Re-evaluation, Cueing hierachy, Internal/external aids, Functional tasks, and Multimodal communication approach   Thank you,  Havery Moros, CCC-SLP [phone]  Kaislyn Gulas, CCC-SLP 12/28/2022, 2:58 PM

## 2022-12-30 ENCOUNTER — Encounter (HOSPITAL_COMMUNITY): Payer: Self-pay | Admitting: Speech Pathology

## 2022-12-30 ENCOUNTER — Ambulatory Visit (HOSPITAL_COMMUNITY): Payer: Medicare Other | Admitting: Speech Pathology

## 2022-12-30 DIAGNOSIS — R471 Dysarthria and anarthria: Secondary | ICD-10-CM

## 2022-12-30 DIAGNOSIS — Z9181 History of falling: Secondary | ICD-10-CM | POA: Diagnosis not present

## 2022-12-30 DIAGNOSIS — M6281 Muscle weakness (generalized): Secondary | ICD-10-CM | POA: Diagnosis not present

## 2022-12-30 DIAGNOSIS — R262 Difficulty in walking, not elsewhere classified: Secondary | ICD-10-CM | POA: Diagnosis not present

## 2022-12-30 NOTE — Therapy (Signed)
OUTPATIENT SPEECH LANGUAGE PATHOLOGY [facility]   Patient Name: [name] MRN: [id number] DOB:March 16, 1940, 82 y.o., female Today's Date: 12/30/2022  PCP: Megan Nam, MD REFERRING PROVIDER: Vladimir Faster, DO  END OF SESSION:  End of Session - 12/30/22 1017     Visit Number 7    Number of Visits 13    Date for SLP Re-Evaluation 02/10/23    Authorization Type Medicare Primary and BCBS secondary    SLP Start Time [phone]    SLP Stop Time  1024    SLP Time Calculation (min) 42 min    Activity Tolerance Patient tolerated treatment well             Past Medical History:  Diagnosis Date   Allergy    SEASONAL   Anemia    Anemia-NOS / PMH, Dr [name]   Arthritis    Breast cancer (HCC)    stage I right breast   Cataract    BILATERAL   Decreased vision    R eye   Esophageal Stricture    Hyperlipidemia    Monoclonal gammopathy    MVP (mitral valve prolapse)    Osteoporosis    Parkinson disease (HCC)    Skin cancer    basal cell L neck   Vertical diplopia    Past Surgical History:  Procedure Laterality Date   APPENDECTOMY  2002   BONE BIOPSY  11/06/10   BREAST LUMPECTOMY  02/2010   right breast lumpectomy and sentinel node biopsy   CARDIAC CATHETERIZATION  2005   neg   CATARACT EXTRACTION, BILATERAL  2018   COLONOSCOPY     negative    HEMORRHOID SURGERY  1970's   SALPINGOOPHORECTOMY  2002   for benign growths   SHOULDER SURGERY Right    TOTAL ABDOMINAL HYSTERECTOMY W/ BILATERAL SALPINGOOPHORECTOMY  82 yrs old   for pain,prolapse ; G3 P2   UPPER GASTROINTESTINAL ENDOSCOPY     Patient Active Problem List   Diagnosis Date Noted   Black stools 02/27/2020   Healthcare maintenance 02/05/2019   Mitral valve prolapse 12/14/2018   Pain in right knee 11/28/2018   Livedo reticularis 09/28/2018   Abdominal pain 02/01/2018   Low back pain 12/29/2017   Edema 11/13/2017   Right arm pain 02/03/2017   Recurrent falls 02/03/2017   Advance care  planning 01/23/2017   Hematuria 07/20/2016   Headache 05/11/2015   Globus sensation 04/24/2015   Shoulder pain 04/17/2014   Parkinson's disease (HCC) 06/05/2013   Medicare annual wellness visit, subsequent 05/02/2012   Neck pain 05/02/2012   Osteoporosis 03/08/2011   Breast cancer (HCC) 11/19/2010   GERD (gastroesophageal reflux disease) 08/01/2008   DYSPHAGIA UNSPECIFIED 08/01/2008   ORTHOSTATIC HYPOTENSION 06/28/2008   HLD (hyperlipidemia) 07/10/2007   MONOCLONAL GAMMOPATHY 07/10/2007   ANEMIA-NOS 07/10/2007   FATIGUE 07/10/2007    ONSET DATE: 06/05/2013  REFERRING DIAG: Parkinson's disease  THERAPY DIAG:  Dysarthria and anarthria  Rationale for Evaluation and Treatment: Rehabilitation  SUBJECTIVE:   SUBJECTIVE STATEMENT: "I did my exercises."  Pt accompanied by: self and family member  PERTINENT HISTORY: [name] is an 82 yo female who was referred for SLP evaluation and treatment for dysarthria in setting of Parkinson's Disease. Pt indicates she was diagnosed with PD in May of 2015 and did have some SLP therapy the following year in [place]. Pt recently had Xeomin for her sialorrhea.She was referred for speech evaluation by Dr. Lurena Joiner Tat, DO.   PAIN:  Are you  having pain? No  FALLS: Has patient fallen in last 6 months?  Yes  PATIENT GOALS: Increase speech intelligibility  OBJECTIVE VOICE ASSESSMENT: Sustained "ah" maximum phonation time: 11 seconds Sustained "ah" loudness average: 82 dB Oral reading (passage) loudness average: 70 dB Oral reading loudness range: 69-72 dB Conversational loudness average: 78 dB Conversational loudness range:  Voice quality: diplophonia and tremor Stimulability trials: Given SLP modeling and occasional min cues, loudness average increased to 92dB (range of 86 to 92) at (loud "ah", word, sentence, paragraph, conversation) level.    Previous Session:  Pt accompanied to therapy by her daughter-in-law. She forgot to bring  her water and her workbook. When asked how twice daily practice was going at home, she stated that she tried to "at least do once per day". She said that she is occasionally at her boyfriend's house and does not have her workbook. Last session, SLP created a cheat sheet with streamlined cues to complete her practice and was given two sheets to keep one at the other house. She did not take it to his house yet. SLP facilitated session by completion of Lesson 6. SLP timed the first 5 tasks (m-cycle, /a/ sustained, pitch glide, counting, and reading sentences) and it took only 13 minutes. Pt then stated that she thought she could make time to do that at home twice per day. She required min reminders for intent throughout targeted exercises and again before completing the cognitive task. She easily could spell words backwards aloud and appeared pleased that she could do this (and with great intent!). Pt with fairly significant left arm movement throughout the session today (medications recently changed). Continue plan of care.      TODAY'S TREATMENT: Pt accompanied to therapy by her daughter-in-law. She forgot to bring her workbook because she said she thought she had PT today. She did endorse completing her SPEAK OUT! Therapy exercises twice at home. Pt noted to have less tremor and dyskinesia today. She needed more reminders for speaking with "intent" throughout the session. Lesson 7 completed today and Pt required mod assist for m-cycle and glides. Pt with improved "intent" when completing oral reading and cognitive communication tasks. SLP created index card for cues to use at home to complete exercises. SLP asked Pt to bring her cell phone next session so that we can record a sample of how to run through the first 4 exercises to use at home. Pt with inconsistent with practice at home and this impacts accuracy of completing exercises. SLP emphasized the importance of home exercise. Plan for 3 more treatment  sessions to address goals (she missed 2 visits) and d/c to group program.                                                          DATE: 12/30/22  PATIENT EDUCATION: Education details: SPEAK OUT! Therapy workbook Lesson 6+ Person educated: Patient and Child(ren) Education method: Explanation Education comprehension: verbalized understanding  HOME EXERCISE PROGRAM: Pt will completed HEP as assigned to facilitate carryover of speech tasks in home and community environment.   GOALS: Goals reviewed with patient? Yes  SHORT TERM GOALS: Target date: 01/22/23  Pt will coordinate vocal and articulatory subsystems in hierarchical speech tasks by producing sounds with intention with min assistance.  Baseline: mi/mod Goal status: ONGOING  2.  Generalize intentional speech to cognitive-linguistic exercises and conversational speech with improved vocal quality, loudness, articulatory precision, and endurance while maintaining a minimum of 85 dB with min assistance. Baseline: ~78 dB Goal status: ONGOING  3.  Read phrases, sentences, and paragraphs with intention, yielding improved vocal quality, loudness, articulatory precision, and endurance while maintaining a minimum of 85 dB with min assistance.  Baseline: ~69 dB Goal status: ONGOING  4.  Patient will maintain a maximum phonation duration of 10+ seconds or greater with minimal cues during sustained phonation over 10 trials. Baseline: Met over one trial, reduced with additional repetitions Goal status: ONGOING  LONG TERM GOALS: Target date: 02/26/2023  Pt will speak with intent during spontaneous 1:1 conversations with indirect cues. Baseline: mi/mod Goal status: ONGOING   ASSESSMENT:  CLINICAL IMPRESSION: (from initial evaluation 11/16/2022) Patient is an 82 y.o. female who was seen today for a speech evaluation. 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 She demonstrates adequate vocal  intensity ~75% of the time, but has difficulty with pacing and breath support which is negatively impacted by dyskinesia. Pt participated in SLP therapy in the past and is familiar with increasing vocal intensity to improve speech. Pt with reduced vocal intensity during oral reading and spontaneous conversation (~69 dB) and benefited from cues to use vocal intent and projection. Pt is a good candidate for SPEAK OUT! Therapy and is motivated to address her dysarthria.   OBJECTIVE IMPAIRMENTS: Objective impairments include dysarthria. These impairments are limiting patient from effectively communicating at home and in community.Factors affecting potential to achieve goals and functional outcome are medical prognosis.. Patient will benefit from skilled SLP services to address above impairments and improve overall function.  REHAB POTENTIAL: Good  PLAN:  SLP FREQUENCY: 2x/week  SLP DURATION: other: for 4 weeks and then one additional visit 4-6 weeks after the 9th treatment session  PLANNED INTERVENTIONS: 92507 Treatment of speech (30 or 45 min) , Re-evaluation, Cueing hierachy, Internal/external aids, Functional tasks, and Multimodal communication approach   Thank you,  Havery Moros, CCC-SLP [phone]  Delecia Vastine, CCC-SLP 12/30/2022, 12:37 PM

## 2023-01-06 ENCOUNTER — Ambulatory Visit (HOSPITAL_COMMUNITY): Payer: Medicare Other

## 2023-01-06 ENCOUNTER — Encounter (HOSPITAL_COMMUNITY): Payer: Self-pay

## 2023-01-06 DIAGNOSIS — M6281 Muscle weakness (generalized): Secondary | ICD-10-CM | POA: Diagnosis not present

## 2023-01-06 DIAGNOSIS — R262 Difficulty in walking, not elsewhere classified: Secondary | ICD-10-CM | POA: Diagnosis not present

## 2023-01-06 DIAGNOSIS — Z9181 History of falling: Secondary | ICD-10-CM | POA: Diagnosis not present

## 2023-01-06 DIAGNOSIS — R471 Dysarthria and anarthria: Secondary | ICD-10-CM | POA: Diagnosis not present

## 2023-01-06 NOTE — Therapy (Signed)
OUTPATIENT PHYSICAL THERAPY TREATMENT     Patient Name: [name] MRN: [id number] DOB:[birth year], 82 y.o., female Today's Date: 01/06/2023  END OF SESSION:  PT End of Session - 01/06/23 1000     Visit Number 13    Number of Visits 24    Date for PT Re-Evaluation 01/27/23    Authorization Type medicare, BCBS Supplement    Progress Note Due on Visit 20    PT Start Time 1005    PT Stop Time 1050    PT Time Calculation (min) 45 min    Equipment Utilized During Treatment Gait belt    Activity Tolerance Patient tolerated treatment well    Behavior During Therapy WFL for tasks assessed/performed                     Past Medical History:  Diagnosis Date   Allergy    SEASONAL   Anemia    Anemia-NOS / PMH, Dr Arbutus Ped   Arthritis    Breast cancer (HCC)    stage I right breast   Cataract    BILATERAL   Decreased vision    R eye   Esophageal Stricture    Hyperlipidemia    Monoclonal gammopathy    MVP (mitral valve prolapse)    Osteoporosis    Parkinson disease (HCC)    Skin cancer    basal cell L neck   Vertical diplopia    Past Surgical History:  Procedure Laterality Date   APPENDECTOMY  2002   BONE BIOPSY  11/06/10   BREAST LUMPECTOMY  02/2010   right breast lumpectomy and sentinel node biopsy   CARDIAC CATHETERIZATION  2005   neg   CATARACT EXTRACTION, BILATERAL  2018   COLONOSCOPY     negative    HEMORRHOID SURGERY  1970's   SALPINGOOPHORECTOMY  2002   for benign growths   SHOULDER SURGERY Right    TOTAL ABDOMINAL HYSTERECTOMY W/ BILATERAL SALPINGOOPHORECTOMY  82 yrs old   for pain,prolapse ; G3 P2   UPPER GASTROINTESTINAL ENDOSCOPY     Patient Active Problem List   Diagnosis Date Noted   Black stools 02/27/2020   Healthcare maintenance 02/05/2019   Mitral valve prolapse 12/14/2018   Pain in right knee 11/28/2018   Livedo reticularis 09/28/2018   Abdominal pain 02/01/2018   Low back pain 12/29/2017   Edema 11/13/2017   Right arm  pain 02/03/2017   Recurrent falls 02/03/2017   Advance care planning 01/23/2017   Hematuria 07/20/2016   Headache 05/11/2015   Globus sensation 04/24/2015   Shoulder pain 04/17/2014   Parkinson's disease (HCC) 06/05/2013   Medicare annual wellness visit, subsequent 05/02/2012   Neck pain 05/02/2012   Osteoporosis 03/08/2011   Breast cancer (HCC) 11/19/2010   GERD (gastroesophageal reflux disease) 08/01/2008   DYSPHAGIA UNSPECIFIED 08/01/2008   ORTHOSTATIC HYPOTENSION 06/28/2008   HLD (hyperlipidemia) 07/10/2007   MONOCLONAL GAMMOPATHY 07/10/2007   ANEMIA-NOS 07/10/2007   FATIGUE 07/10/2007    PCP: Crawford Givens  REFERRING PROVIDER: Vladimir Faster, DO  REFERRING DIAG: Parkinson   THERAPY DIAG:  Difficulty in walking, not elsewhere classified  Muscle weakness (generalized)  History of falling  Rationale for Evaluation and Treatment: Rehabilitation  ONSET DATE: 01/26/2022  SUBJECTIVE:   SUBJECTIVE STATEMENT:  Pt arrived with SPC.  Pt reports she feels the medication she is taking is worse.  Reports worse in the last 3 days.  Reports Rt knee pain that hurts bad with weight bearing.  PERTINENT  HISTORY:  Parkinson's.  PAIN:  Are you having pain? Back we are not focusing on this, limited activity to not aggravate pt back   PRECAUTIONS: Fall WEIGHT BEARING RESTRICTIONS: No  FALLS:  Has patient fallen in last 6 months? Yes. Number of falls 8  PLOF: Independent with basic ADLs  PATIENT GOALS: to stop falling as much   NEXT MD VISIT: not sure  OBJECTIVE:  Note: Objective measures were completed at Evaluation unless otherwise noted.   COGNITION: Overall cognitive status: Within functional limits for tasks assessed     SENSATION: WFL  deg  POSTURE: rounded shoulders, decreased lumbar lordosis, and decreased thoracic kyphosis  LOWER EXTREMITY MMT:  MMT Right eval Left eval  Hip flexion 5 5  Hip extension 3 3  Hip abduction 4+  3+  Knee flexion 3+ 3+   Knee extension 4+ 5  Ankle dorsiflexion 3+ 3+   FUNCTIONAL TESTS:  Evaluation: 30 seconds chair stand test:  2 x in 30" 2 minute walk test: with cane 292 ft with knees bend and decreased ankle motion  Single leg stance:  RT: 4"; Lt 55"   12/14/22 30 seconds chair stand test:  10X (was 2 x) in 30" 2 minute walk test:   49' with RW and CGA (pt ran into an object and had several LOB) at eval (was with cane 292 ft with knees bend and decreased ankle motion)  Single leg stance:  Rt 4" max (was RT: 4"; Lt 55")   TODAY'S TREATMENT:                                                                                                                              DATE:  01/06/23: Supine PWR UP, rock, twist and turn x 10 Prone PWR UP, rock, twist and turn x 5 Standing PWR up, rock, twist and turn 5x BIG steps down hallway with 2# BUE Called MD and set up apt for 01/13/23.  12/21/22 Standing :  Big walk down hall forward and side ways  Sitting PWR UP, rock, twist and turn x 10 2 sets Supine PWR UP, rock, twist and turn x 10 Prone PWR UP, rock, twist and turn x 5 Standing PWR up, rock than complained of knee pain PWR! in sitting position Nustep level 3 x 5 minutes   Person educated: Patient Education method: Explanation, Verbal cues, and Handouts Education comprehension: verbalized understanding and returned demonstration  HOME EXERCISE PROGRAM: Access Code: [email or web address] URL: [email or web address] Date: 11/04/2022 Prepared by: Virgina Organ  Exercises - Supine Bridge  - 2 x daily - 7 x weekly - 1 sets - 10 reps - 5 seconds hold - Supine Lower Trunk Rotation  - 2 x daily - 7 x weekly - 1 sets - 10 reps - 3 seconds  hold - Sit to Stand with Armchair  - 2 x daily - 7 x weekly - 1 sets - 10 reps - Prone Hip Extension  -  1 x daily - 7 x weekly - 2 sets - 10 reps - 3 seconds  hold  11/30/22: - Sidelying Hip Abduction  - 2 x daily - 7 x weekly - 2 sets - 10 reps - 5" hold -  Quadruped Pelvic Floor Contraction with Opposite Arm and Leg Lift  - 1 x daily - 7 x weekly - 3 sets - 10 reps  ASSESSMENT:  CLINICAL IMPRESSION:  Pt presents with constant uncontrolled movements especially with UE today following change of medication.  Pt and daughter-in-law reports it has gotten worse in the last 3 days.  Encouraged to contact MD, called made to MD and scheduled at during session today.  Continued with PWR activities with min A for safety, monitored knee pain during standing exercises.    OBJECTIVE IMPAIRMENTS: Abnormal gait, decreased activity tolerance, decreased balance, decreased mobility, decreased strength, and increased fascial restrictions.   ACTIVITY LIMITATIONS: carrying, standing, and locomotion level  PARTICIPATION LIMITATIONS: shopping and community activity  REAB POTENTIAL: Good  CLINICAL DECISION MAKING: Evolving/moderate complexity  EVALUATION COMPLEXITY: Moderate   GOALS: Goals reviewed with patient? No  SHORT TERM GOALS: Target date: 11/25/22 PT to be I in HEP in order to improve strength and balance to have not fallen in the past two weeks  Baseline: Goal status:  met    2.  PT to be able to complete 5 sit to stand in a 30 second period.   Baseline:  Goal status:  met   3.  PT to be able to balance on her left leg for 10 seconds to reduce risk of falling  Baseline:  Goal status:  on-going    LONG TERM GOALS: Target date: 12/17/22  PT to be I in an advanced HEP in order to have not had a fall in the past five weeks.  Baseline:  Goal status: on-going   2.  PT LE strength to be increased by one grade to allow pt to come sit to stand from a couch without difficulty  Baseline:  Goal status:  on-going   3.  Pt to be able to single leg stance on her Lt leg for 20 seconds to decrease risk of falling  Baseline:  Goal status:  on-going    PLAN:  PT FREQUENCY: 2x/week  PT DURATION: 6 weeks additional 6 weeks   PLANNED  INTERVENTIONS: 97146- PT Re-evaluation, 97110-Therapeutic exercises, 97530- Therapeutic activity, [id number]- Neuromuscular re-education, 97535- Self Care, and Patient/Family education  PLAN FOR NEXT SESSION: Balance training, PWR, functional strengthening.   Becky Sax, LPTA/CLT; CBIS [phone]  Juel Burrow, PTA 01/06/2023, 1:58 PM

## 2023-01-11 ENCOUNTER — Ambulatory Visit (HOSPITAL_COMMUNITY): Payer: Medicare Other | Admitting: Speech Pathology

## 2023-01-11 ENCOUNTER — Encounter (HOSPITAL_COMMUNITY): Payer: Self-pay | Admitting: Speech Pathology

## 2023-01-11 DIAGNOSIS — R471 Dysarthria and anarthria: Secondary | ICD-10-CM

## 2023-01-11 DIAGNOSIS — M6281 Muscle weakness (generalized): Secondary | ICD-10-CM | POA: Diagnosis not present

## 2023-01-11 DIAGNOSIS — R262 Difficulty in walking, not elsewhere classified: Secondary | ICD-10-CM | POA: Diagnosis not present

## 2023-01-11 DIAGNOSIS — Z9181 History of falling: Secondary | ICD-10-CM | POA: Diagnosis not present

## 2023-01-11 NOTE — Therapy (Signed)
OUTPATIENT SPEECH LANGUAGE PATHOLOGY PARKINSON'S TREATMENT   Patient Name: [name] MRN: [id number] DOB:1940-03-14, 82 y.o., female Today's Date: 01/11/2023  PCP: Joaquim Nam, MD REFERRING PROVIDER: Vladimir Faster, DO  END OF SESSION:  End of Session - 01/11/23 1621     Visit Number 8    Number of Visits 13    Date for SLP Re-Evaluation 02/10/23    Authorization Type Medicare Primary and BCBS secondary    SLP Start Time 1610    SLP Stop Time  1655    SLP Time Calculation (min) 45 min    Activity Tolerance Patient tolerated treatment well             Past Medical History:  Diagnosis Date   Allergy    SEASONAL   Anemia    Anemia-NOS / PMH, Dr Arbutus Ped   Arthritis    Breast cancer (HCC)    stage I right breast   Cataract    BILATERAL   Decreased vision    R eye   Esophageal Stricture    Hyperlipidemia    Monoclonal gammopathy    MVP (mitral valve prolapse)    Osteoporosis    Parkinson disease (HCC)    Skin cancer    basal cell L neck   Vertical diplopia    Past Surgical History:  Procedure Laterality Date   APPENDECTOMY  2002   BONE BIOPSY  11/06/10   BREAST LUMPECTOMY  02/2010   right breast lumpectomy and sentinel node biopsy   CARDIAC CATHETERIZATION  2005   neg   CATARACT EXTRACTION, BILATERAL  2018   COLONOSCOPY     negative    HEMORRHOID SURGERY  1970's   SALPINGOOPHORECTOMY  2002   for benign growths   SHOULDER SURGERY Right    TOTAL ABDOMINAL HYSTERECTOMY W/ BILATERAL SALPINGOOPHORECTOMY  82 yrs old   for pain,prolapse ; G3 P2   UPPER GASTROINTESTINAL ENDOSCOPY     Patient Active Problem List   Diagnosis Date Noted   Black stools 02/27/2020   Healthcare maintenance 02/05/2019   Mitral valve prolapse 12/14/2018   Pain in right knee 11/28/2018   Livedo reticularis 09/28/2018   Abdominal pain 02/01/2018   Low back pain 12/29/2017   Edema 11/13/2017   Right arm pain 02/03/2017   Recurrent falls 02/03/2017   Advance care  planning 01/23/2017   Hematuria 07/20/2016   Headache 05/11/2015   Globus sensation 04/24/2015   Shoulder pain 04/17/2014   Parkinson's disease (HCC) 06/05/2013   Medicare annual wellness visit, subsequent 05/02/2012   Neck pain 05/02/2012   Osteoporosis 03/08/2011   Breast cancer (HCC) 11/19/2010   GERD (gastroesophageal reflux disease) 08/01/2008   DYSPHAGIA UNSPECIFIED 08/01/2008   ORTHOSTATIC HYPOTENSION 06/28/2008   HLD (hyperlipidemia) 07/10/2007   MONOCLONAL GAMMOPATHY 07/10/2007   ANEMIA-NOS 07/10/2007   FATIGUE 07/10/2007    ONSET DATE: 06/05/2013  REFERRING DIAG: Parkinson's disease  THERAPY DIAG:  Dysarthria and anarthria  Rationale for Evaluation and Treatment: Rehabilitation  SUBJECTIVE:   SUBJECTIVE STATEMENT: "Tom's son said he can hear me when I talk now."  Pt accompanied by: self and family member  PERTINENT HISTORY: [name] is an 82 yo female who was referred for SLP evaluation and treatment for dysarthria in setting of Parkinson's Disease. Pt indicates she was diagnosed with PD in May of 2015 and did have some SLP therapy the following year in [place]. Pt recently had Xeomin for her sialorrhea.She was referred for speech evaluation by Dr. Lurena Joiner Tat,  DO.   PAIN:  Are you having pain? No  FALLS: Has patient fallen in last 6 months?  Yes  PATIENT GOALS: Increase speech intelligibility  OBJECTIVE VOICE ASSESSMENT: Sustained "ah" maximum phonation time: 11 seconds Sustained "ah" loudness average: 82 dB Oral reading (passage) loudness average: 70 dB Oral reading loudness range: 69-72 dB Conversational loudness average: 78 dB Conversational loudness range:  Voice quality: diplophonia and tremor Stimulability trials: Given SLP modeling and occasional min cues, loudness average increased to 92dB (range of 86 to 92) at (loud "ah", word, sentence, paragraph, conversation) level.    Previous Session: Pt accompanied to therapy by her  daughter-in-law. She forgot to bring her workbook because she said she thought she had PT today. She did endorse completing her SPEAK OUT! Therapy exercises twice at home. Pt noted to have less tremor and dyskinesia today. She needed more reminders for speaking with "intent" throughout the session. Lesson 7 completed today and Pt required mod assist for m-cycle and glides. Pt with improved "intent" when completing oral reading and cognitive communication tasks. SLP created index card for cues to use at home to complete exercises. SLP asked Pt to bring her cell phone next session so that we can record a sample of how to run through the first 4 exercises to use at home. Pt with inconsistent with practice at home and this impacts accuracy of completing exercises. SLP emphasized the importance of home exercise. Plan for 3 more treatment sessions to address goals (she missed 2 visits) and d/c to group program.       TODAY'S TREATMENT: Pt accompanied to therapy by her daughter-in-law. She completed lesson 12 in session with assist as needed from SLP. She brought her workbook and completed up to [place] 12 at home. She needs initial prompts/modeling for the first 4 exercises, but then completed with min cues. She rarely required cues during the oral reading task and cognitive/communication task. When she first came into the room, she struggled with mild vocal hoarseness and needed cues to sip water and then proceed with task and clarity improved. She indicated that she occasionally mixes up her medication schedule as she now takes them at: 7, 11, 3, and 7 instead of every 2 hours. Continue completion of SPEAKOUT! Therapy in sessions and at home.                                                          DATE: 01/11/23  PATIENT EDUCATION: Education details: SPEAK OUT! Therapy workbook Lesson 6+ Person educated: Patient and Child(ren) Education method: Explanation Education comprehension: verbalized  understanding  HOME EXERCISE PROGRAM: Pt will completed HEP as assigned to facilitate carryover of speech tasks in home and community environment.   GOALS: Goals reviewed with patient? Yes  SHORT TERM GOALS: Target date: 01/22/23  Pt will coordinate vocal and articulatory subsystems in hierarchical speech tasks by producing sounds with intention with min assistance.  Baseline: mi/mod Goal status: ONGOING  2.  Generalize intentional speech to cognitive-linguistic exercises and conversational speech with improved vocal quality, loudness, articulatory precision, and endurance while maintaining a minimum of 85 dB with min assistance. Baseline: ~78 dB Goal status: ONGOING  3.  Read phrases, sentences, and paragraphs with intention, yielding improved vocal quality, loudness, articulatory precision, and endurance while maintaining a minimum of  85 dB with min assistance.  Baseline: ~69 dB Goal status: ONGOING  4.  Patient will maintain a maximum phonation duration of 10+ seconds or greater with minimal cues during sustained phonation over 10 trials. Baseline: Met over one trial, reduced with additional repetitions Goal status: ONGOING  LONG TERM GOALS: Target date: 02/26/2023  Pt will speak with intent during spontaneous 1:1 conversations with indirect cues. Baseline: mi/mod Goal status: ONGOING   ASSESSMENT:  CLINICAL IMPRESSION: (from initial evaluation 11/16/2022) Patient is an 82 y.o. female who was seen today for a speech evaluation. 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 She demonstrates adequate vocal intensity ~75% of the time, but has difficulty with pacing and breath support which is negatively impacted by dyskinesia. Pt participated in SLP therapy in the past and is familiar with increasing vocal intensity to improve speech. Pt with reduced vocal intensity during oral reading and spontaneous conversation (~69 dB) and benefited from  cues to use vocal intent and projection. Pt is a good candidate for SPEAK OUT! Therapy and is motivated to address her dysarthria.   OBJECTIVE IMPAIRMENTS: Objective impairments include dysarthria. These impairments are limiting patient from effectively communicating at home and in community.Factors affecting potential to achieve goals and functional outcome are medical prognosis.. Patient will benefit from skilled SLP services to address above impairments and improve overall function.  REHAB POTENTIAL: Good  PLAN:  SLP FREQUENCY: 2x/week  SLP DURATION: other: for 4 weeks and then one additional visit 4-6 weeks after the 9th treatment session  PLANNED INTERVENTIONS: 92507 Treatment of speech (30 or 45 min) , Re-evaluation, Cueing hierachy, Internal/external aids, Functional tasks, and Multimodal communication approach   Thank you,  Havery Moros, CCC-SLP [phone]  Rheda Kassab, CCC-SLP 01/11/2023, 4:24 PM

## 2023-01-11 NOTE — Progress Notes (Unsigned)
Assessment/Plan:    1.  Parkinsons Disease with dyskinesia             -Doing well with less dyskinesia on carbidopa/levodopa 25/100 CR, 1.5 tablets at 7 AM/11 AM/3 PM/7 PM.  For now, we decided to hold off on bedtime levodopa, but we talked about it today.             -continue amantadine 100 mg at 7am and 3 pm for significant dyskinesia.  Dyskinesia, however, is improved markedly on CR levodopa.  It does not sound like she is having a lot of off time.             -needs to use walker at all times.  She is doing much better with this.   2.  Sialorrhea             -has not done xeomin since since February 12, 2022.  Not exactly sure what happened, but she is scheduled to restart Xeomin March 18, 2023    3.  RBD             -Continue clonazepam, 0.5 mg, half tablet at bedtime.               -Use bed rails.   4.  Constipation with what sounds like overflow incontinence             -saw GI NP - Treatment with benefiber recommended and helping   5.  Depression             -On venlafaxine.  6.  R knee pain after fall  -Call primary care to get her an appointment as soon as we could.  Her fall occurred getting out of the car.  Subjective:   [name] was seen today in follow up for Parkinsons disease.  My previous records were reviewed prior to todays visit as well as outside records available to me.  Pt with grandson who supplements hx.   I saw patient on video less than a month ago.  We had her on Rytary, which helped but unfortunately was too expensive.  She changed back to immediate release and had severe dyskinesia, so last visit on video we changed her to the older extended release.  She reports today that she is doing better but she did fall getting out of the car and she hurt her knee.    Current prescribed movement disorder medications: carbidopa/levodopa 25/100 CR, 1.5 tablets at 7 AM/11 AM/3 PM/7 PM Amantadine, 100 mg at 7 AM/3 PM  Clonazepam 0.5 mg, half tablet at  bed   PREVIOUS MEDICATIONS: has clonazepam but rarely takes; Rytary (helped to decrease dyskinesia and had same amount of on time but was too costly)  ALLERGIES:   Allergies  Allergen Reactions   Penicillins Shortness Of Breath and Itching    CURRENT MEDICATIONS:  Outpatient Encounter Medications as of 01/13/2023  Medication Sig   amantadine (SYMMETREL) 100 MG capsule TAKE ONE CAPSULE BY MOUTH TWICE DAILY AT 7am AND 1pm   b complex vitamins tablet Take 1 tablet by mouth daily.   Calcium 1500 MG tablet Take 1,500 mg by mouth.   Carbidopa-Levodopa ER (SINEMET CR) 25-100 MG tablet controlled release 1.5 tabs at 7am/11am/3pm/7pm   celecoxib (CELEBREX) 200 MG capsule Take 1 capsule (200 mg total) by mouth daily.   Cholecalciferol 5000 units TABS Take 1 tablet (5,000 Units total) by mouth daily.   clonazePAM (KLONOPIN) 0.5 MG tablet TAKE 1/2  TABLET BY MOUTH AT BEDTIME   Denosumab (PROLIA Davison) Inject into the skin every 6 (six) months.   Elastic Bandages & Supports (ABDOMINAL BINDER/ELASTIC SMALL) MISC 1 Device by Does not apply route daily.   Multiple Vitamin (MULTIVITAMIN) capsule 2 tabs po qd   Omega-3 Fatty Acids (OMEGA 3 PO) 1 tab po qd   omeprazole (PRILOSEC) 20 MG capsule TAKE (1) CAPSULE BY MOUTH ONCE DAILY.   OnabotulinumtoxinA (BOTOX IM) Inject into the muscle every 3 (three) months. Injection every 3 months   venlafaxine XR (EFFEXOR-XR) 37.5 MG 24 hr capsule Take 37.5 mg by mouth daily with breakfast.   venlafaxine XR (EFFEXOR-XR) 75 MG 24 hr capsule Take 75 mg by mouth daily.   vitamin B-12 (CYANOCOBALAMIN) 500 MCG tablet Take 2500 mcg daily.   Facility-Administered Encounter Medications as of 01/13/2023  Medication   incobotulinumtoxinA (XEOMIN) 100 units injection 100 Units   incobotulinumtoxinA (XEOMIN) 100 units injection 100 Units    Objective:   PHYSICAL EXAMINATION:    VITALS:   Vitals:   01/13/23 1452  BP: 134/84  Pulse: 96  SpO2: 96%  Weight: 113 lb (51.3  kg)  Height: 4\' 11"  (1.499 m)     GEN:  The patient appears stated age and is in NAD. HEENT:  Normocephalic, atraumatic.  The mucous membranes are moist. The superficial temporal arteries are without ropiness or tenderness. CV:  RRR Lungs:  CTAB  Neurological examination:  Orientation: The patient is alert and oriented x3. Cranial nerves: There is good facial symmetry with facial hypomimia. There is R esotropia but it fluctuates (denies diplopia).  The speech is fluent and hypophonic. Soft palate rises symmetrically and there is no tongue deviation. Hearing is intact to conversational tone. Sensation: Sensation is intact to light touch throughout Motor: Strength is at least antigravity x4.  Movement examination: Tone: There is nl tone in the UE/LE Abnormal movements: she has LUE rest tremor, mild.   Coordination:  There is mild to mod decremation with finger taps and hand opening and closing bilaterally Gait and Station: The patient pushes off to arise.  She ambulates with her walker and does well with this.    Total time spent on today's visit was 33 minutes, including both face-to-face time and nonface-to-face time.  Time included that spent on review of records (prior notes available to me/labs/imaging if pertinent), discussing treatment and goals, answering patient's questions and coordinating care.  Cc:  Joaquim Nam, MD

## 2023-01-13 ENCOUNTER — Ambulatory Visit: Payer: Medicare Other | Admitting: Neurology

## 2023-01-13 ENCOUNTER — Encounter: Payer: Self-pay | Admitting: Neurology

## 2023-01-13 VITALS — BP 134/84 | HR 96 | Ht 59.0 in | Wt 113.0 lb

## 2023-01-13 DIAGNOSIS — G20B2 Parkinson's disease with dyskinesia, with fluctuations: Secondary | ICD-10-CM

## 2023-01-13 DIAGNOSIS — M25561 Pain in right knee: Secondary | ICD-10-CM | POA: Diagnosis not present

## 2023-01-13 MED ORDER — AMANTADINE HCL 100 MG PO CAPS: ORAL_CAPSULE | ORAL | 1 refills | Status: DC

## 2023-01-14 ENCOUNTER — Ambulatory Visit (HOSPITAL_COMMUNITY): Payer: Medicare Other | Admitting: Physical Therapy

## 2023-01-14 DIAGNOSIS — R471 Dysarthria and anarthria: Secondary | ICD-10-CM | POA: Diagnosis not present

## 2023-01-14 DIAGNOSIS — Z9181 History of falling: Secondary | ICD-10-CM | POA: Diagnosis not present

## 2023-01-14 DIAGNOSIS — M6281 Muscle weakness (generalized): Secondary | ICD-10-CM

## 2023-01-14 DIAGNOSIS — R262 Difficulty in walking, not elsewhere classified: Secondary | ICD-10-CM | POA: Diagnosis not present

## 2023-01-14 NOTE — Therapy (Signed)
OUTPATIENT PHYSICAL THERAPY TREATMENT     Patient Name: [name] MRN: [id number] DOB:20-Mar-1940, 82 y.o., female Today's Date: 01/14/2023  END OF SESSION:  PT End of Session - 01/14/23 1103     Visit Number 14    Number of Visits 24    Date for PT Re-Evaluation 01/27/23    Authorization Type medicare, BCBS Supplement    Progress Note Due on Visit 20    PT Start Time 1015    PT Stop Time 1100    PT Time Calculation (min) 45 min    Equipment Utilized During Treatment Gait belt    Activity Tolerance Patient tolerated treatment well    Behavior During Therapy WFL for tasks assessed/performed                      Past Medical History:  Diagnosis Date   Allergy    SEASONAL   Anemia    Anemia-NOS / PMH, Dr Arbutus Ped   Arthritis    Breast cancer (HCC)    stage I right breast   Cataract    BILATERAL   Decreased vision    R eye   Esophageal Stricture    Hyperlipidemia    Monoclonal gammopathy    MVP (mitral valve prolapse)    Osteoporosis    Parkinson disease (HCC)    Skin cancer    basal cell L neck   Vertical diplopia    Past Surgical History:  Procedure Laterality Date   APPENDECTOMY  2002   BONE BIOPSY  11/06/10   BREAST LUMPECTOMY  02/2010   right breast lumpectomy and sentinel node biopsy   CARDIAC CATHETERIZATION  2005   neg   CATARACT EXTRACTION, BILATERAL  2018   COLONOSCOPY     negative    HEMORRHOID SURGERY  1970's   SALPINGOOPHORECTOMY  2002   for benign growths   SHOULDER SURGERY Right    TOTAL ABDOMINAL HYSTERECTOMY W/ BILATERAL SALPINGOOPHORECTOMY  82 yrs old   for pain,prolapse ; G3 P2   UPPER GASTROINTESTINAL ENDOSCOPY     Patient Active Problem List   Diagnosis Date Noted   Black stools 02/27/2020   Healthcare maintenance 02/05/2019   Mitral valve prolapse 12/14/2018   Pain in right knee 11/28/2018   Livedo reticularis 09/28/2018   Abdominal pain 02/01/2018   Low back pain 12/29/2017   Edema 11/13/2017   Right  arm pain 02/03/2017   Recurrent falls 02/03/2017   Advance care planning 01/23/2017   Hematuria 07/20/2016   Headache 05/11/2015   Globus sensation 04/24/2015   Shoulder pain 04/17/2014   Parkinson's disease (HCC) 06/05/2013   Medicare annual wellness visit, subsequent 05/02/2012   Neck pain 05/02/2012   Osteoporosis 03/08/2011   Breast cancer (HCC) 11/19/2010   GERD (gastroesophageal reflux disease) 08/01/2008   DYSPHAGIA UNSPECIFIED 08/01/2008   ORTHOSTATIC HYPOTENSION 06/28/2008   HLD (hyperlipidemia) 07/10/2007   MONOCLONAL GAMMOPATHY 07/10/2007   ANEMIA-NOS 07/10/2007   FATIGUE 07/10/2007    PCP: [name]  REFERRING PROVIDER: Vladimir Faster, DO  REFERRING DIAG: Parkinson   THERAPY DIAG:  Difficulty in walking, not elsewhere classified  Muscle weakness (generalized)  History of falling  Rationale for Evaluation and Treatment: Rehabilitation  ONSET DATE: 01/26/2022  SUBJECTIVE:   SUBJECTIVE STATEMENT: Pt states that she fell earlier this week.   PERTINENT HISTORY:  Parkinson's.  PAIN:  Are you having pain? Back we are not focusing on this, limited activity to not aggravate pt back  PRECAUTIONS: Fall WEIGHT BEARING RESTRICTIONS: No  FALLS:  Has patient fallen in last 6 months? Yes. Number of falls 8  PLOF: Independent with basic ADLs  PATIENT GOALS: to stop falling as much   NEXT MD VISIT: not sure  OBJECTIVE:  Note: Objective measures were completed at Evaluation unless otherwise noted.   COGNITION: Overall cognitive status: Within functional limits for tasks assessed     SENSATION: WFL  deg  POSTURE: rounded shoulders, decreased lumbar lordosis, and decreased thoracic kyphosis  LOWER EXTREMITY MMT:  MMT Right eval Left eval  Hip flexion 5 5  Hip extension 3 3  Hip abduction 4+  3+  Knee flexion 3+ 3+  Knee extension 4+ 5  Ankle dorsiflexion 3+ 3+   FUNCTIONAL TESTS:  Evaluation: 30 seconds chair stand test:  2 x in 30" 2  minute walk test: with cane 292 ft with knees bend and decreased ankle motion  Single leg stance:  RT: 4"; Lt 55"   12/14/22 30 seconds chair stand test:  10X (was 2 x) in 30" 2 minute walk test:   81' with RW and CGA (pt ran into an object and had several LOB) at eval (was with cane 292 ft with knees bend and decreased ankle motion)  Single leg stance:  Rt 4" max (was RT: 4"; Lt 55")   TODAY'S TREATMENT:                                                                                                                              DATE: 01/14/23 Nustep  x 7:00 Sitting PWR UP, rock, twist and turn x 10  Supine PWR UP, rock, twist and turn x 10 Prone PWR UP, rock, twist and turn x 10 All four PWR UProck, twist and turn x 10 Standing PWR up, rock, twist and turn 10x 01/06/23: Supine PWR UP, rock, twist and turn x 10 Prone PWR UP, rock, twist and turn x 5 Standing PWR up, rock, twist and turn 5x BIG steps down hallway with 2# BUE Called MD and set up apt for 01/13/23.  12/21/22 Standing :  Big walk down hall forward and side ways  Sitting PWR UP, rock, twist and turn x 10 2 sets Supine PWR UP, rock, twist and turn x 10 Prone PWR UP, rock, twist and turn x 5 Standing PWR up, rock than complained of knee pain PWR! in sitting position Nustep level 3 x 5 minutes   Person educated: Patient Education method: Explanation, Verbal cues, and Handouts Education comprehension: verbalized understanding and returned demonstration  HOME EXERCISE PROGRAM: Access Code: [email or web address] URL: [email or web address] Date: 11/04/2022 Prepared by: Virgina Organ  Exercises - Supine Bridge  - 2 x daily - 7 x weekly - 1 sets - 10 reps - 5 seconds hold - Supine Lower Trunk Rotation  - 2 x daily - 7 x weekly - 1 sets - 10 reps - 3 seconds  hold - Sit to  Stand with Armchair  - 2 x daily - 7 x weekly - 1 sets - 10 reps - Prone Hip Extension  - 1 x daily - 7 x weekly - 2 sets - 10 reps - 3 seconds   hold  11/30/22: - Sidelying Hip Abduction  - 2 x daily - 7 x weekly - 2 sets - 10 reps - 5" hold - Quadruped Pelvic Floor Contraction with Opposite Arm and Leg Lift  - 1 x daily - 7 x weekly - 3 sets - 10 reps  ASSESSMENT:  CLINICAL IMPRESSION:  Pt presents with  uncontrolled movement of  UE today following change of medication.  Pt and daughter-in-law reports it has gotten worse in the last 3 days.  Encouraged to contact MD, called made to MD and scheduled at during session today.  Continued with PWR activities to promote improved ambulation.  Pt needs  min A for safety during standing exercises.    OBJECTIVE IMPAIRMENTS: Abnormal gait, decreased activity tolerance, decreased balance, decreased mobility, decreased strength, and increased fascial restrictions.   ACTIVITY LIMITATIONS: carrying, standing, and locomotion level  PARTICIPATION LIMITATIONS: shopping and community activity  REAB POTENTIAL: Good  CLINICAL DECISION MAKING: Evolving/moderate complexity  EVALUATION COMPLEXITY: Moderate   GOALS: Goals reviewed with patient? No  SHORT TERM GOALS: Target date: 11/25/22 PT to be I in HEP in order to improve strength and balance to have not fallen in the past two weeks  Baseline: Goal status:  met    2.  PT to be able to complete 5 sit to stand in a 30 second period.   Baseline:  Goal status:  met   3.  PT to be able to balance on her left leg for 10 seconds to reduce risk of falling  Baseline:  Goal status:  on-going    LONG TERM GOALS: Target date: 12/17/22  PT to be I in an advanced HEP in order to have not had a fall in the past five weeks.  Baseline:  Goal status: on-going   2.  PT LE strength to be increased by one grade to allow pt to come sit to stand from a couch without difficulty  Baseline:  Goal status:  on-going   3.  Pt to be able to single leg stance on her Lt leg for 20 seconds to decrease risk of falling  Baseline:  Goal status:  on-going     PLAN:  PT FREQUENCY: 2x/week  PT DURATION: 6 weeks additional 6 weeks   PLANNED INTERVENTIONS: 97146- PT Re-evaluation, 97110-Therapeutic exercises, 97530- Therapeutic activity, [id number]- Neuromuscular re-education, 97535- Self Care, and Patient/Family education  PLAN FOR NEXT SESSION: Balance training, PWR, functional strengthening.  Virgina Organ, PT CLT [phone]  01/14/2023, 11:03 AM

## 2023-01-17 ENCOUNTER — Ambulatory Visit: Payer: Medicare Other | Admitting: Family Medicine

## 2023-01-20 ENCOUNTER — Encounter: Payer: Self-pay | Admitting: Family Medicine

## 2023-01-20 ENCOUNTER — Ambulatory Visit: Payer: Medicare Other | Admitting: Family Medicine

## 2023-01-20 ENCOUNTER — Other Ambulatory Visit: Payer: Self-pay | Admitting: Family Medicine

## 2023-01-20 ENCOUNTER — Ambulatory Visit (HOSPITAL_COMMUNITY): Payer: Medicare Other | Admitting: Speech Pathology

## 2023-01-20 VITALS — BP 150/88 | HR 92 | Temp 97.8°F | Ht 63.0 in | Wt 103.1 lb

## 2023-01-20 DIAGNOSIS — G8929 Other chronic pain: Secondary | ICD-10-CM | POA: Diagnosis not present

## 2023-01-20 DIAGNOSIS — R296 Repeated falls: Secondary | ICD-10-CM | POA: Diagnosis not present

## 2023-01-20 DIAGNOSIS — M1711 Unilateral primary osteoarthritis, right knee: Secondary | ICD-10-CM

## 2023-01-20 DIAGNOSIS — M25561 Pain in right knee: Secondary | ICD-10-CM | POA: Diagnosis not present

## 2023-01-20 DIAGNOSIS — G20A1 Parkinson's disease without dyskinesia, without mention of fluctuations: Secondary | ICD-10-CM | POA: Diagnosis not present

## 2023-01-20 MED ORDER — CELECOXIB 200 MG PO CAPS: 200.0000 mg | ORAL_CAPSULE | Freq: Every day | ORAL | 2 refills | Status: DC

## 2023-01-20 NOTE — Progress Notes (Signed)
Jaisen Wiltrout T. Rayfield Beem, MD, CAQ Sports Medicine [facility] at [facility] [address] [place], [address]  Phone: [phone]  FAX: [phone]  [name] - 82 y.o. female  MRN [id number]  Date of Birth: 16-Aug-1940  Date: 01/20/2023  PCP: Joaquim Nam, MD  Referral: Joaquim Nam, MD  Chief Complaint  Patient presents with   Fall    2 weeks ago   Knee Pain    Right   Subjective:   [name] is a 82 y.o. very pleasant female patient with Body mass index is 18.27 kg/m. who presents with the following:  New R knee pain after fall:  Parkinsons at baseline. She has extensive tremor visualized.  I saw her actually 2 months ago after a fall.  She has had another fall about 2 weeks ago.  She tells me that she is fallen at least 10 times in the last year.  No major fracture per her report.  She does have a minimal effusion and some swelling at the lateral aspect of the knee.  I did do an intra-articular knee injection which seemed to help a couple of months ago, however she has had again repeated trauma.  I gave her some Celebrex the last time she was in the office, right now she does not remember anything about that.  She has been doing physical therapy, did review their notes.  They have been working on balance, walking, and trying to do some basic strength.  Review of Systems is noted in the HPI, as appropriate  Objective:   BP (!) 150/88 (BP Location: Left Arm, Patient Position: Sitting, Cuff Size: Normal)   Pulse 92   Temp 97.8 F (36.6 C) (Temporal)   Ht 5\' 3"  (1.6 m)   Wt 103 lb 2 oz (46.8 kg)   SpO2 99%   BMI 18.27 kg/m   GEN: No acute distress; alert,appropriate. PULM: Breathing comfortably in no respiratory distress PSYCH: Normally interactive.   Right knee: She lacks 3 degrees of extension.  Flexion to 123.  Mild effusion Mild tenderness at the medial lateral joint lines Nontender at the Pes anserine  bursa Nontender at the patellar or quadriceps tendon Flexion pinch and McMurray's are nontender as well as bounce home testing  Laboratory and Imaging Data: DG Knee Complete 4 Views Right Result Date: 12/19/2022 CLINICAL DATA:  Recent fall, right knee pain EXAM: RIGHT KNEE - COMPLETE 4+ VIEW COMPARISON:  None Available. FINDINGS: No acute osseous finding, fracture, malalignment, or joint effusion. Mild tricompartmental osteoarthritis noted with joint space loss, sclerosis, bony spurring, and chondrocalcinosis. Nonspecific soft tissue calcifications of the anterior tibial region. Patella is located. IMPRESSION: Degenerative changes as above. No acute finding by plain radiography. Electronically Signed   By: Judie Petit.  Shick M.D.   On: 12/19/2022 09:28     Assessment and Plan:     ICD-10-CM   1. Chronic pain of right knee  M25.561    G89.29     2. Primary osteoarthritis of right knee  M17.11     3. Recurrent falls  R29.6     4. Parkinson's disease without dyskinesia or fluctuating manifestations (HCC)  G20.A1      Intermittent chronic knee pain in a patient who is fallen 10 times over the last year.  She will continue to have various musculoskeletal injuries and complaints indefinitely unless she can become stronger and have improved balance.  I think that this is really more of  a neurological issue from Parkinson's and physical debility and weakness.  Doubt current internal derangement.  I think her prognosis is fair to poor in terms of joint and knee issues.  I am going to send physical therapy a message about her.  Looks like they are already working on some strength and balance issues to decrease risk of falls.  Medication Management during today's office visit: Meds ordered this encounter  Medications   celecoxib (CELEBREX) 200 MG capsule    Sig: Take 1 capsule (200 mg total) by mouth daily.    Dispense:  30 capsule    Refill:  2   Medications Discontinued During This Encounter   Medication Reason   celecoxib (CELEBREX) 200 MG capsule Reorder    Orders placed today for conditions managed today: No orders of the defined types were placed in this encounter.   Disposition: No follow-ups on file.  Dragon [employer] One speech-to-text software was used for transcription in this dictation.  Possible transcriptional errors can occur using Animal nutritionist.   Signed,  Elpidio Galea. Kazumi Lachney, MD   Outpatient Encounter Medications as of 01/20/2023  Medication Sig   amantadine (SYMMETREL) 100 MG capsule 1 at 7am/3pm   b complex vitamins tablet Take 1 tablet by mouth daily.   Calcium 1500 MG tablet Take 1,500 mg by mouth.   Carbidopa-Levodopa ER (SINEMET CR) 25-100 MG tablet controlled release 1.5 tabs at 7am/11am/3pm/7pm   Cholecalciferol 5000 units TABS Take 1 tablet (5,000 Units total) by mouth daily.   clonazePAM (KLONOPIN) 0.5 MG tablet TAKE 1/2 TABLET BY MOUTH AT BEDTIME   Denosumab (PROLIA [place]) Inject into the skin every 6 (six) months.   Elastic Bandages & Supports (ABDOMINAL BINDER/ELASTIC SMALL) MISC 1 Device by Does not apply route daily.   Multiple Vitamin (MULTIVITAMIN) capsule 2 tabs po qd   Omega-3 Fatty Acids (OMEGA 3 PO) 1 tab po qd   omeprazole (PRILOSEC) 20 MG capsule TAKE (1) CAPSULE BY MOUTH ONCE DAILY.   OnabotulinumtoxinA (BOTOX IM) Inject into the muscle every 3 (three) months. Injection every 3 months   venlafaxine XR (EFFEXOR-XR) 37.5 MG 24 hr capsule Take 37.5 mg by mouth daily with breakfast.   venlafaxine XR (EFFEXOR-XR) 75 MG 24 hr capsule Take 75 mg by mouth daily.   vitamin B-12 (CYANOCOBALAMIN) 500 MCG tablet Take 2500 mcg daily.   [DISCONTINUED] celecoxib (CELEBREX) 200 MG capsule Take 1 capsule (200 mg total) by mouth daily.   celecoxib (CELEBREX) 200 MG capsule Take 1 capsule (200 mg total) by mouth daily.   Facility-Administered Encounter Medications as of 01/20/2023  Medication   incobotulinumtoxinA (XEOMIN) 100 units injection 100  Units   incobotulinumtoxinA (XEOMIN) 100 units injection 100 Units

## 2023-01-24 NOTE — Telephone Encounter (Signed)
Patient has not been seen in over a year. Ok to refill and make appointment. Also past script was for tid this one is for every day. Is that right?

## 2023-01-25 NOTE — Telephone Encounter (Signed)
Patient called in returning call she received. Relayed message below to patient and she stated that she is taking both does together. Scheduled for her annual physical in January.

## 2023-01-25 NOTE — Telephone Encounter (Signed)
 Thanks

## 2023-01-25 NOTE — Telephone Encounter (Signed)
Unable to reach patient. Left voicemail to return call to our office.   

## 2023-01-25 NOTE — Telephone Encounter (Signed)
Please verify that she is taking 37.5mg  and 75mg  dose together.    Rx sent.    Please schedule yearly visit.  Thanks.

## 2023-02-01 ENCOUNTER — Ambulatory Visit (HOSPITAL_COMMUNITY): Payer: Medicare Other | Admitting: Speech Pathology

## 2023-02-08 ENCOUNTER — Ambulatory Visit (HOSPITAL_COMMUNITY): Payer: Medicare Other | Admitting: Speech Pathology

## 2023-02-10 ENCOUNTER — Encounter: Payer: Self-pay | Admitting: Family Medicine

## 2023-02-10 ENCOUNTER — Ambulatory Visit (INDEPENDENT_AMBULATORY_CARE_PROVIDER_SITE_OTHER): Payer: Medicare Other | Admitting: Family Medicine

## 2023-02-10 VITALS — BP 114/78 | HR 89 | Temp 98.1°F | Ht 61.81 in | Wt 101.4 lb

## 2023-02-10 DIAGNOSIS — R109 Unspecified abdominal pain: Secondary | ICD-10-CM | POA: Diagnosis not present

## 2023-02-10 DIAGNOSIS — G20A1 Parkinson's disease without dyskinesia, without mention of fluctuations: Secondary | ICD-10-CM | POA: Diagnosis not present

## 2023-02-10 DIAGNOSIS — Z0000 Encounter for general adult medical examination without abnormal findings: Secondary | ICD-10-CM

## 2023-02-10 DIAGNOSIS — Z7189 Other specified counseling: Secondary | ICD-10-CM

## 2023-02-10 DIAGNOSIS — M542 Cervicalgia: Secondary | ICD-10-CM

## 2023-02-10 MED ORDER — METHOCARBAMOL 500 MG PO TABS: 500.0000 mg | ORAL_TABLET | Freq: Three times a day (TID) | ORAL | 2 refills | Status: DC | PRN

## 2023-02-10 NOTE — Progress Notes (Signed)
Flu 2024 Shingles up-to-date PNA up-to-date Tetanus discussed with patient, done 2021 per patient report. COVID-vaccine previously done Colon cancer screening deferred given pending bone marrow biopsy. Breast cancer screening 2024 Advance directive-son Lisbeth Ply is designated if patient were incapacitated.  Parkinson's. Fall cautions d/w pt.  Still with parkinsonian movement at rest, progressive.  Using cane vs walker.  Still seeing Dr. Arbutus Leas.  Progressive sx with motor changes.  Discussed neck pain and knee pain.  Prev injection in the knee helped but only for about 2 weeks.  She is having persistent neck pain, along the left superior trapezius, likely exacerbated by her movement disorder.  Fecal incontinence and abd discomfort d/w pt.  Longstanding.  She was asking about FODMAP diet, to see if that would help with bloating and discomfort.  Handout given and discussed.  Prolia per hematology.  I will defer.  She agrees.  Meds, vitals, and allergies reviewed.   ROS: Per HPI unless specifically indicated in ROS section   Persistent parkinsonian movement during the exam Not in respiratory distress. Neck is supple but left superior trapezius is sore, ttp.  Rrr Ctab Abd soft, not ttp Skin well-perfused. Extremities without edema.  31 minutes were devoted to patient care in this encounter (this includes time spent reviewing the patient's file/history, interviewing and examining the patient, counseling/reviewing plan with patient).

## 2023-02-10 NOTE — Patient Instructions (Signed)
You could try methocarbamol for neck pain.  I'll update Dr. Arbutus Leas.  Please look at the FODMAP diet to see if any foods affect bloating/etc.  Take care.  Glad to see you.

## 2023-02-13 ENCOUNTER — Telehealth: Payer: Self-pay | Admitting: Family Medicine

## 2023-02-13 NOTE — Assessment & Plan Note (Signed)
She is compliant with her medication but having increasing persistent symptoms and I will ask neurology if there are any other options available.  I greatly appreciate neurology input.

## 2023-02-13 NOTE — Assessment & Plan Note (Signed)
She could try methocarbamol for neck pain.  Sedation caution discussed with patient.  When I asked for input from neurology.

## 2023-02-13 NOTE — Assessment & Plan Note (Signed)
Advance directive-son Rolan Bucco is designated if patient were incapacitated.

## 2023-02-13 NOTE — Assessment & Plan Note (Signed)
She was asking about FODMAP diet, to see if that would help with bloating and discomfort.  Handout given and discussed.  I think it makes sense to try food elimination and see if that makes any difference.

## 2023-02-13 NOTE — Assessment & Plan Note (Signed)
Flu 2024 Shingles up-to-date PNA up-to-date Tetanus discussed with patient, done 2021 per patient report. COVID-vaccine previously done Colon cancer screening deferred given pending bone marrow biopsy. Breast cancer screening 2024 Advance directive-son Lisbeth Ply is designated if patient were incapacitated.

## 2023-02-13 NOTE — Telephone Encounter (Signed)
I saw this kind patient in clinic recently and her parkinsonian movement is progressively worse.  I did not know if there was any other options available to try to help with this.  I told her I would ask for your input.  The patient and I I greatly appreciate your help.

## 2023-02-14 ENCOUNTER — Other Ambulatory Visit: Payer: Self-pay | Admitting: Neurology

## 2023-02-14 ENCOUNTER — Other Ambulatory Visit: Payer: Self-pay | Admitting: Family Medicine

## 2023-02-14 DIAGNOSIS — F330 Major depressive disorder, recurrent, mild: Secondary | ICD-10-CM

## 2023-02-16 ENCOUNTER — Ambulatory Visit: Payer: Medicare Other | Admitting: Neurology

## 2023-02-16 NOTE — Progress Notes (Unsigned)
Assessment/Plan:    1.  Parkinsons Disease with dyskinesia  -Patient's dyskinesia can be quite significant, but unfortunately she could not afford the Rytary.  She had significantly less dyskinesia on Rytary.  Her primary care had not seen her in a while, so I think that is why he was surprised when he saw her and saw the amount of dyskinesia.  She and I discussed that Rytary may go generic in July, but it is unclear.  I will send referral to [facility] to see if they have any options for her in terms of cost of Rytary.  Otherwise, we will have to continue to leave her on CR levodopa, which she has done better on in terms of dyskinesia than she did on IR levodopa.  -dyskinesia is mod today and not bothersome to patient but it does become bothersome when more severe, per patient today  -I will check on patient assistance program for crexont, 280 mg bid-tid             -continue carbidopa/levodopa 25/100 CR, 1.5 tablets at 7 AM/11 AM/3 PM/7 PM.               -I am going to try to increase her amantadine to 100 mg 3 times per day.  She will let me know if she gets confusion or hallucinations with it.  Hopefully, this will help dyskinesia more without side effects.  -We discussed Vyalev, which is foscarbidopa/foslevodopa pump that is newly FDA approved.  We discussed that it is for motor fluctuations in adults with advanced Parkinson's disease.  We discussed that this likely will not be on Medicare formulary until the latter half of 2025.  We discussed risks and benefits of this drug.              -needs to use walker at all times.  She is doing much better with this.   2.  Sialorrhea             -back on xeomin injections   3.  RBD             -Continue clonazepam, 0.5 mg, half tablet at bedtime.               -Use bed rails.   4.  Constipation with what sounds like overflow incontinence             -saw GI NP - Treatment with benefiber recommended and helping   5.  Depression             -On  venlafaxine.  Subjective:   [name] was seen today in follow up for Parkinsons disease.  Daughter in law supplements hx.  My previous records were reviewed prior to todays visit as well as outside records available to me.    He recently saw her at my request, but he noted significant dyskinesia and asked her to follow back up.  I just saw her last month.  She had done quite well on Rytary, but unfortunately could not afford it.  We ended up moving her back to extended release levodopa, which she did better on, but not as well as Rytary.  Dyskinesia intermittently bothersome, if it involves neck.  Current prescribed movement disorder medications: carbidopa/levodopa 25/100 CR, 1.5 tablets at 7 AM/11 AM/3 PM/7 PM Amantadine, 100 mg at 7 AM/3 PM  Clonazepam 0.5 mg, half tablet at bed   PREVIOUS MEDICATIONS: has clonazepam but rarely takes; Rytary (helped to  decrease dyskinesia and had same amount of on time but was too costly)  ALLERGIES:   Allergies  Allergen Reactions   Penicillins Shortness Of Breath and Itching    CURRENT MEDICATIONS:  Outpatient Encounter Medications as of 02/17/2023  Medication Sig   amantadine (SYMMETREL) 100 MG capsule 1 at 7am/3pm   b complex vitamins tablet Take 1 tablet by mouth daily.   Calcium 1500 MG tablet Take 1,500 mg by mouth.   Carbidopa-Levodopa ER (SINEMET CR) 25-100 MG tablet controlled release 1.5 tabs at 7am/11am/3pm/7pm   celecoxib (CELEBREX) 200 MG capsule Take 1 capsule (200 mg total) by mouth daily.   Cholecalciferol 5000 units TABS Take 1 tablet (5,000 Units total) by mouth daily.   clonazePAM (KLONOPIN) 0.5 MG tablet TAKE 1/2 TABLET BY MOUTH AT BEDTIME   Denosumab (PROLIA [place]) Inject into the skin every 6 (six) months.   Elastic Bandages & Supports (ABDOMINAL BINDER/ELASTIC SMALL) MISC 1 Device by Does not apply route daily.   methocarbamol (ROBAXIN) 500 MG tablet Take 1 tablet (500 mg total) by mouth every 8 (eight) hours as needed  for muscle spasms (sedation caution.).   Multiple Vitamin (MULTIVITAMIN) capsule 2 tabs po qd   Omega-3 Fatty Acids (OMEGA 3 PO) 1 tab po qd   omeprazole (PRILOSEC) 20 MG capsule TAKE ONE CAPSULE BY MOUTH ONCE DAILY   OnabotulinumtoxinA (BOTOX IM) Inject into the muscle every 3 (three) months. Injection every 3 months   venlafaxine XR (EFFEXOR-XR) 37.5 MG 24 hr capsule TAKE ONE CAPSULE BY MOUTH EVERY MORNING with breakfast   venlafaxine XR (EFFEXOR-XR) 75 MG 24 hr capsule Take 75 mg by mouth daily.   vitamin B-12 (CYANOCOBALAMIN) 500 MCG tablet Take 2500 mcg daily.   Facility-Administered Encounter Medications as of 02/17/2023  Medication   incobotulinumtoxinA (XEOMIN) 100 units injection 100 Units   incobotulinumtoxinA (XEOMIN) 100 units injection 100 Units    Objective:   PHYSICAL EXAMINATION:    VITALS:   Vitals:   02/17/23 0758  BP: 124/70  Pulse: 71  SpO2: 96%  Weight: 102 lb 11.2 oz (46.6 kg)  Height: 5\' 3"  (1.6 m)      GEN:  The patient appears stated age and is in NAD. HEENT:  Normocephalic, atraumatic.  The mucous membranes are moist. The superficial temporal arteries are without ropiness or tenderness. CV:  RRR Lungs:  CTAB  Neurological examination:  Orientation: The patient is alert and oriented x3. Cranial nerves: There is good facial symmetry with facial hypomimia. There is R esotropia but it fluctuates (denies diplopia).  The speech is fluent and hypophonic. Soft palate rises symmetrically and there is no tongue deviation. Hearing is intact to conversational tone. Sensation: Sensation is intact to light touch throughout Motor: Strength is at least antigravity x4.  Movement examination: Tone: There is nl tone in the UE/LE Abnormal movements: mod dyskinesia today.  No tremor Coordination:  There is mild to mod decremation with finger taps and hand opening and closing bilaterally Gait and Station: The patient pushes off to arise.  She ambulates with her  walker and does well with this.    Total time spent on today's visit was 40 minutes, including both face-to-face time and nonface-to-face time.  Time included that spent on review of records (prior notes available to me/labs/imaging if pertinent), discussing treatment and goals, answering patient's questions and coordinating care.  Cc:  Joaquim Nam, MD

## 2023-02-17 ENCOUNTER — Other Ambulatory Visit: Payer: Self-pay

## 2023-02-17 ENCOUNTER — Ambulatory Visit (INDEPENDENT_AMBULATORY_CARE_PROVIDER_SITE_OTHER): Payer: Medicare Other | Admitting: Neurology

## 2023-02-17 ENCOUNTER — Telehealth: Payer: Self-pay

## 2023-02-17 ENCOUNTER — Ambulatory Visit (HOSPITAL_COMMUNITY): Payer: Medicare Other | Admitting: Speech Pathology

## 2023-02-17 ENCOUNTER — Encounter: Payer: Self-pay | Admitting: Neurology

## 2023-02-17 VITALS — BP 124/70 | HR 71 | Ht 63.0 in | Wt 102.7 lb

## 2023-02-17 DIAGNOSIS — K117 Disturbances of salivary secretion: Secondary | ICD-10-CM

## 2023-02-17 DIAGNOSIS — G20B2 Parkinson's disease with dyskinesia, with fluctuations: Secondary | ICD-10-CM

## 2023-02-17 MED ORDER — AMANTADINE HCL 100 MG PO CAPS: 100.0000 mg | ORAL_CAPSULE | Freq: Three times a day (TID) | ORAL | 1 refills | Status: DC

## 2023-02-17 NOTE — Patient Instructions (Signed)
Increase amantadine to 100 mg capsules at 7am/11am/4pm

## 2023-02-17 NOTE — Progress Notes (Signed)
Care Guide Pharmacy Note  02/17/2023 Name: [name] MRN: [id number] DOB: [birth year]  Referred By: Joaquim Nam, MD Reason for referral: Care Coordination (Outreach to schedule with pharm d )   [name] is a 83 y.o. year old female who is a primary care patient of Joaquim Nam, MD.  [name] was referred to the pharmacist for assistance related to:  parkinsons disease   Successful contact was made with the patient to discuss pharmacy services including being ready for the pharmacist to call at least 5 minutes before the scheduled appointment time and to have medication bottles and any blood pressure readings ready for review. The patient agreed to meet with the pharmacist via telephone visit on (date/time). 02/18/2023  [name] , RMA     [place]  [facility], [facility] Guide  Direct Dial: [phone]  Website: [email or web address]

## 2023-02-18 ENCOUNTER — Other Ambulatory Visit: Payer: Medicare Other | Admitting: Pharmacist

## 2023-02-18 ENCOUNTER — Encounter: Payer: Self-pay | Admitting: Pharmacist

## 2023-02-18 DIAGNOSIS — G20A1 Parkinson's disease without dyskinesia, without mention of fluctuations: Secondary | ICD-10-CM

## 2023-02-18 NOTE — Progress Notes (Signed)
Placed in [place] tray

## 2023-02-18 NOTE — Progress Notes (Addendum)
Manufacturer Assistance Program (MAP) Application   Manufacturer:  [employer]     (New enrollment) Medication(s): Rytary (carbidopa/levodopa)  Patient Portion of Application:  02/18/23:  Filled out with patient via online tool.  Income Documentation:  Pending - SS Letter  Provider Portion of Application:  FAXED TO NEUR[facility]    Application faxed to [facility] atn to Dr. Lurena Joiner Tat.  Faxed 01/27/23.  Application must be signed and submitted to program via fax from prescriber of Rytary.

## 2023-02-18 NOTE — Progress Notes (Signed)
I will work on the hard copy when I return to clinic. Thanks.

## 2023-02-18 NOTE — Patient Instructions (Signed)
Ms. [name],   It was a pleasure to speak with you today! As we discussed:?  We have initiated your application for the following medication assistance program:   [employer] Patient Assistance Medication: [employer] Patient Assistance Program. [id number], [place], [place] [address] Phone: [phone] M-F 8am-5pm CST    Fax: [phone]    Loree Fee, PharmD Clinical Pharmacist [facility] [phone]

## 2023-02-18 NOTE — Progress Notes (Signed)
   02/18/2023 Name: [name] MRN: [id number] DOB: [birth year]  Subjective  Chief Complaint  Patient presents with   Medication Access    Care Team: Primary Care Provider: Joaquim Nam, MD  Reason for visit: ?  [name] is a 83 y.o. female who presents today for a telephone visit with the pharmacist due to medication access concerns regarding their carbidopa/levodopa. ?   Medication Access: ?  Prescription drug coverage: Payor: MEDICARE / Plan: MEDICARE PART A AND B / Product Type: *No Product type* / .   Current Patient Assistance:  None  Patient lives in a household of 1 with an estimated combined monthly income of $1800  via Social Security Retirement.  Assessment and Plan:   1. Medication Access Initiate PAP application for Rytary via [employer] Patient Assistance Program  PAP details documented in separate note  Confirm w patient if able to determine savings/assets (in regard to Medicare LIS)  Future Appointments  Date Time Provider Department Center  03/18/2023 10:15 AM Tat, Octaviano Batty, DO [facility] None  05/03/2023  8:15 AM Tat, Octaviano Batty, DO [facility] None  05/05/2023  9:00 AM [facility] None  05/05/2023  9:45 AM [facility] FLUSH [facility] None  11/03/2023  9:00 AM [facility] None  11/03/2023  9:45 AM [facility] FLUSH [facility] None  11/10/2023  8:30 AM Heilingoetter, Cassandra L, PA-C [facility] None    Loree Fee, PharmD Clinical Pharmacist [facility] [phone]

## 2023-02-21 ENCOUNTER — Other Ambulatory Visit (HOSPITAL_COMMUNITY): Payer: Self-pay

## 2023-02-21 ENCOUNTER — Telehealth: Payer: Self-pay

## 2023-02-21 NOTE — Telephone Encounter (Signed)
Pharmacy Patient Advocate Encounter  Insurance verification completed.   The patient is insured through [facility]   Ran test claim for Carbidopa-Levodopa ER 25-100MG . Currently a quantity of 180 is a 30 day supply and the co-pay is $43.30  Ran test claim for Rytary. Currently a quantity of 180 is a 30 day supply and the co-pay is $648.62   This test claim was processed through [facility]- copay amounts may vary at other pharmacies due to pharmacy/plan contracts, or as the patient moves through the different stages of their insurance plan.

## 2023-02-21 NOTE — Telephone Encounter (Signed)
-----   Message from Loree Fee sent at 02/18/2023  3:29 PM EST ----- Hi! Can we run a 1 month test claim for Rytary specifically? They have a PAP program though it requires proof from the pharmacy of a Medicare D copay 200+ for a month of Rytary. Not sure if the test claim will work but I don't see why not  Thank you!  -Lillia Abed

## 2023-03-04 ENCOUNTER — Telehealth: Payer: Self-pay | Admitting: Neurology

## 2023-03-04 NOTE — Telephone Encounter (Signed)
 Patient needs to speak with someone about her medication. She said it was too high for her but she got a letter in mail and they told her to talk with Dr.tat

## 2023-03-07 NOTE — Telephone Encounter (Signed)
 Called pateint and she needs Dr. Winferd Hatter to sign her patient assistance form. Patients daughter is in the hospital in [place] and her nephew died yesterday so she is going to send her son with the paperwork

## 2023-03-08 ENCOUNTER — Telehealth: Payer: Self-pay | Admitting: Neurology

## 2023-03-08 NOTE — Telephone Encounter (Signed)
Pt. Son dropped off info fro pt in Tat's box

## 2023-03-12 ENCOUNTER — Other Ambulatory Visit (HOSPITAL_COMMUNITY): Payer: Self-pay

## 2023-03-12 ENCOUNTER — Telehealth: Payer: Self-pay | Admitting: Pharmacy Technician

## 2023-03-15 ENCOUNTER — Telehealth: Payer: Self-pay | Admitting: Neurology

## 2023-03-15 NOTE — Telephone Encounter (Signed)
Pt called and LM. She received some medication today and is unsure what to do with it. Does she take it? Wait? She would like a call back

## 2023-03-16 NOTE — Telephone Encounter (Signed)
They sent her the 145 mg? I am not sure why?

## 2023-03-16 NOTE — Telephone Encounter (Signed)
Patient was taking Carbidopa levodopa CR and she has received her Rytary. Does she need to do anything to make that change?

## 2023-03-16 NOTE — Telephone Encounter (Signed)
I looked at last note for Rytary and it does say  start rytary 145 mg 1 capsule 4 times per day at 7am/11am/3pm/7pm. If not helpful, I gave her samples of the 195 mg as well to increase to in a month. She will call me and let me know how she does  I do not see a note for increase unless I have missed the increase

## 2023-03-17 NOTE — Telephone Encounter (Signed)
Same instructions for 145 mg

## 2023-03-18 ENCOUNTER — Ambulatory Visit (INDEPENDENT_AMBULATORY_CARE_PROVIDER_SITE_OTHER): Payer: Medicare Other | Admitting: Neurology

## 2023-03-18 DIAGNOSIS — K117 Disturbances of salivary secretion: Secondary | ICD-10-CM | POA: Diagnosis not present

## 2023-03-18 MED ORDER — INCOBOTULINUMTOXINA 100 UNITS IM SOLR
100.0000 [IU] | INTRAMUSCULAR | Status: AC
  Administered 2023-03-18: 100 [IU] via INTRAMUSCULAR

## 2023-03-18 NOTE — Patient Instructions (Signed)
rytary 145 mg 1 capsule 4 times per day at 7am/11am/3pm/7pm

## 2023-03-18 NOTE — Procedures (Signed)
Botulinum Clinic   History:  Diagnosis: Sialorrhea associated with PD (icd10: K11.7) Results:  helps   Informed consent was obtained.  The patient was educated on the botulinum toxin the black blox warning and given a copy of the botox patient medication guide.  The patient understands that this warning states that there have been reported cases of the Botox extending beyond the injection site and creating adverse effects, similar to those of botulism. This included loss of strength, trouble walking, hoarseness, trouble saying words clearly, loss of bladder control, trouble breathing, trouble swallowing, diplopia, blurry vision and ptosis. Most of the distant spread of Botox was happening in patients, primarily children, who received medication for spasticity or for cervical dystonia. The patient expressed understanding and desire to proceed.   Injections  Location Left  Right Units Number of sites  Parotid (point 1 on picture) 30 30 60 1 per side  Submandibular (point 2) 20 20 40 1 per side  TOTAL UNITS:   100    Type of Toxin: Xeomin Discarded Units: 0  Needle drawback with each injection was free of blood. Pt tolerated procedure well without complications.   Reinjection is anticipated in 4 months.

## 2023-04-12 ENCOUNTER — Ambulatory Visit: Payer: Medicare Other | Admitting: Neurology

## 2023-04-20 ENCOUNTER — Emergency Department (HOSPITAL_BASED_OUTPATIENT_CLINIC_OR_DEPARTMENT_OTHER)
Admission: EM | Admit: 2023-04-20 | Discharge: 2023-04-21 | Disposition: A | Discharge status: Home / Self Care | Attending: Emergency Medicine | Admitting: Emergency Medicine

## 2023-04-20 ENCOUNTER — Emergency Department (HOSPITAL_BASED_OUTPATIENT_CLINIC_OR_DEPARTMENT_OTHER)

## 2023-04-20 ENCOUNTER — Emergency Department (HOSPITAL_BASED_OUTPATIENT_CLINIC_OR_DEPARTMENT_OTHER): Admitting: Radiology

## 2023-04-20 ENCOUNTER — Other Ambulatory Visit: Payer: Self-pay

## 2023-04-20 ENCOUNTER — Encounter (HOSPITAL_BASED_OUTPATIENT_CLINIC_OR_DEPARTMENT_OTHER): Payer: Self-pay

## 2023-04-20 DIAGNOSIS — M47816 Spondylosis without myelopathy or radiculopathy, lumbar region: Secondary | ICD-10-CM | POA: Diagnosis not present

## 2023-04-20 DIAGNOSIS — R251 Tremor, unspecified: Secondary | ICD-10-CM | POA: Insufficient documentation

## 2023-04-20 DIAGNOSIS — W19XXXA Unspecified fall, initial encounter: Secondary | ICD-10-CM | POA: Insufficient documentation

## 2023-04-20 DIAGNOSIS — S0990XA Unspecified injury of head, initial encounter: Secondary | ICD-10-CM | POA: Insufficient documentation

## 2023-04-20 DIAGNOSIS — G20C Parkinsonism, unspecified: Secondary | ICD-10-CM | POA: Insufficient documentation

## 2023-04-20 DIAGNOSIS — Z043 Encounter for examination and observation following other accident: Secondary | ICD-10-CM | POA: Diagnosis not present

## 2023-04-20 DIAGNOSIS — S199XXA Unspecified injury of neck, initial encounter: Secondary | ICD-10-CM | POA: Diagnosis not present

## 2023-04-20 DIAGNOSIS — I6782 Cerebral ischemia: Secondary | ICD-10-CM | POA: Diagnosis not present

## 2023-04-20 DIAGNOSIS — M4186 Other forms of scoliosis, lumbar region: Secondary | ICD-10-CM | POA: Diagnosis not present

## 2023-04-20 DIAGNOSIS — M5126 Other intervertebral disc displacement, lumbar region: Secondary | ICD-10-CM | POA: Diagnosis not present

## 2023-04-20 DIAGNOSIS — G319 Degenerative disease of nervous system, unspecified: Secondary | ICD-10-CM | POA: Diagnosis not present

## 2023-04-20 DIAGNOSIS — R9431 Abnormal electrocardiogram [ECG] [EKG]: Secondary | ICD-10-CM | POA: Diagnosis not present

## 2023-04-20 NOTE — ED Triage Notes (Addendum)
 Pt arrives POV with son. C/o headache and tailbone pain.   Son says he has not heard from mother all day so decided to go check on her.   He noticed she had fallen and thought it was morning time. Also reports a previous fall 3 days ago on Sunday.   He says she lives alone independently and usually GCS-15 but today somewhat confused.   Nih-0.   Declines anticoagulants.

## 2023-04-20 NOTE — ED Notes (Signed)
 Patient transported to CT

## 2023-04-20 NOTE — ED Provider Notes (Signed)
 [place] EMERGENCY DEPARTMENT AT [facility] Provider Note   CSN: [id number] Arrival date & time: 04/20/23  2225     History {Add pertinent medical, surgical, social history, OB history to HPI:1} Chief Complaint  Patient presents with   [facility] Judie Petit Pangallo is a 83 y.o. female.  Patient brought to the emergency department for evaluation after a fall.  He reports that she has a history of Parkinson's and does frequently fall.  She thinks she fell backwards, hit the back and side of her head.  She also reports that she hurt her "butt".  Patient was reportedly slightly disoriented when family checked on her, having thought that it was morning.       Home Medications Prior to Admission medications   Medication Sig Start Date End Date Taking? Authorizing Provider  amantadine (SYMMETREL) 100 MG capsule Take 1 capsule (100 mg total) by mouth 3 (three) times daily. 7am/11am/4pm 02/17/23   Tat, Octaviano Batty, DO  b complex vitamins tablet Take 1 tablet by mouth daily.    [provider]  Calcium 1500 MG tablet Take 1,500 mg by mouth.    [provider]  Carbidopa-Levodopa ER (SINEMET CR) 25-100 MG tablet controlled release 1.5 tabs at 7am/11am/3pm/7pm 12/20/22   Tat, Octaviano Batty, DO  celecoxib (CELEBREX) 200 MG capsule Take 1 capsule (200 mg total) by mouth daily. 01/20/23   Copland, Karleen Hampshire, MD  Cholecalciferol 5000 units TABS Take 1 tablet (5,000 Units total) by mouth daily. 01/23/17   Joaquim Nam, MD  clonazePAM (KLONOPIN) 0.5 MG tablet TAKE 1/2 TABLET BY MOUTH AT BEDTIME 02/14/23   Tat, Octaviano Batty, DO  Denosumab (PROLIA [place]) Inject into the skin every 6 (six) months.    [provider]  Elastic Bandages & Supports (ABDOMINAL BINDER/ELASTIC SMALL) MISC 1 Device by Does not apply route daily. 10/15/15   Tat, Octaviano Batty, DO  methocarbamol (ROBAXIN) 500 MG tablet Take 1 tablet (500 mg total) by mouth every 8 (eight) hours as needed for muscle spasms  (sedation caution.). 02/10/23   Joaquim Nam, MD  Multiple Vitamin (MULTIVITAMIN) capsule 2 tabs po qd    [provider]  Omega-3 Fatty Acids (OMEGA 3 PO) 1 tab po qd    [provider]  omeprazole (PRILOSEC) 20 MG capsule TAKE ONE CAPSULE BY MOUTH ONCE DAILY 02/14/23   Joaquim Nam, MD  OnabotulinumtoxinA (BOTOX IM) Inject into the muscle every 3 (three) months. Injection every 3 months    [provider]  venlafaxine XR (EFFEXOR-XR) 37.5 MG 24 hr capsule TAKE ONE CAPSULE BY MOUTH EVERY MORNING with breakfast 01/25/23   Joaquim Nam, MD  venlafaxine XR (EFFEXOR-XR) 75 MG 24 hr capsule Take 75 mg by mouth daily. 11/26/22   [provider]  vitamin B-12 (CYANOCOBALAMIN) 500 MCG tablet Take 2500 mcg daily.    [provider]      Allergies    Penicillins    Review of Systems   Review of Systems  Physical Exam Updated Vital Signs BP (!) 158/78   Pulse 81   Temp (!) 97.5 F (36.4 C) (Oral)   Resp 18   Ht 5\' 3"  (1.6 m)   Wt 47.6 kg   SpO2 100%   BMI 18.60 kg/m  Physical Exam Vitals and nursing note reviewed.  Constitutional:      General: She is not in acute distress.    Appearance: She is well-developed.  HENT:  Head: Normocephalic. Contusion present.     Mouth/Throat:     Mouth: Mucous membranes are moist.  Eyes:     General: Vision grossly intact. Gaze aligned appropriately.     Extraocular Movements: Extraocular movements intact.     Conjunctiva/sclera: Conjunctivae normal.  Cardiovascular:     Rate and Rhythm: Normal rate and regular rhythm.     Pulses: Normal pulses.     Heart sounds: Normal heart sounds, S1 normal and S2 normal. No murmur heard.    No friction rub. No gallop.  Pulmonary:     Effort: Pulmonary effort is normal. No respiratory distress.     Breath sounds: Normal breath sounds.  Abdominal:     General: Bowel sounds are normal.     Palpations: Abdomen is soft.     Tenderness: There is no  abdominal tenderness. There is no guarding or rebound.     Hernia: No hernia is present.  Musculoskeletal:        General: No swelling.     Cervical back: Full passive range of motion without pain, normal range of motion and neck supple. No spinous process tenderness or muscular tenderness. Normal range of motion.     Right lower leg: No edema.     Left lower leg: No edema.  Skin:    General: Skin is warm and dry.     Capillary Refill: Capillary refill takes less than 2 seconds.     Findings: No ecchymosis, erythema, rash or wound.  Neurological:     General: No focal deficit present.     Mental Status: She is alert and oriented to person, place, and time.     GCS: GCS eye subscore is 4. GCS verbal subscore is 5. GCS motor subscore is 6.     Cranial Nerves: Cranial nerves 2-12 are intact.     Sensory: Sensation is intact.     Motor: Tremor present.     Coordination: Coordination is intact.  Psychiatric:        Attention and Perception: Attention normal.        Mood and Affect: Mood normal.        Speech: Speech normal.        Behavior: Behavior normal.     ED Results / Procedures / Treatments   Labs (all labs ordered are listed, but only abnormal results are displayed) Labs Reviewed  CBC WITH DIFFERENTIAL/PLATELET  COMPREHENSIVE METABOLIC PANEL  URINALYSIS, ROUTINE W REFLEX MICROSCOPIC    EKG None  Radiology No results found.  Procedures Procedures  {Document cardiac monitor, telemetry assessment procedure when appropriate:1}  Medications Ordered in ED Medications - No data to display  ED Course/ Medical Decision Making/ A&P   {   Click here for ABCD2, HEART and other calculatorsREFRESH Note before signing :1}                              Medical Decision Making Amount and/or Complexity of Data Reviewed Labs: ordered. Radiology: ordered.   ***  {Document critical care time when appropriate:1} {Document review of labs and clinical decision tools ie heart  score, Chads2Vasc2 etc:1}  {Document your independent review of radiology images, and any outside records:1} {Document your discussion with family members, caretakers, and with consultants:1} {Document social determinants of health affecting pt's care:1} {Document your decision making why or why not admission, treatments were needed:1} Final Clinical Impression(s) / ED Diagnoses Final diagnoses:  None  Rx / DC Orders ED Discharge Orders     None

## 2023-04-21 ENCOUNTER — Other Ambulatory Visit: Payer: Self-pay | Admitting: Family Medicine

## 2023-04-21 DIAGNOSIS — S0990XA Unspecified injury of head, initial encounter: Secondary | ICD-10-CM | POA: Diagnosis not present

## 2023-04-21 LAB — CBC WITH DIFFERENTIAL/PLATELET
Abs Immature Granulocytes: 0 10*3/uL (ref 0.00–0.07)
Basophils Absolute: 0 10*3/uL (ref 0.0–0.1)
Basophils Relative: 1 %
Eosinophils Absolute: 0.1 10*3/uL (ref 0.0–0.5)
Eosinophils Relative: 2 %
HCT: 38 % (ref 36.0–46.0)
Hemoglobin: 12.5 g/dL (ref 12.0–15.0)
Immature Granulocytes: 0 %
Lymphocytes Relative: 24 %
Lymphs Abs: 1.4 10*3/uL (ref 0.7–4.0)
MCH: 33.6 pg (ref 26.0–34.0)
MCHC: 32.9 g/dL (ref 30.0–36.0)
MCV: 102.2 fL — ABNORMAL HIGH (ref 80.0–100.0)
Monocytes Absolute: 0.3 10*3/uL (ref 0.1–1.0)
Monocytes Relative: 5 %
Neutro Abs: 3.9 10*3/uL (ref 1.7–7.7)
Neutrophils Relative %: 68 %
Platelets: 206 10*3/uL (ref 150–400)
RBC: 3.72 MIL/uL — ABNORMAL LOW (ref 3.87–5.11)
RDW: 13.7 % (ref 11.5–15.5)
WBC: 5.7 10*3/uL (ref 4.0–10.5)
nRBC: 0 % (ref 0.0–0.2)

## 2023-04-21 LAB — COMPREHENSIVE METABOLIC PANEL WITH GFR
ALT: <5 U/L (ref 0–44)
AST: 20 U/L (ref 15–41)
Albumin: 3.9 g/dL (ref 3.5–5.0)
Alkaline Phosphatase: 63 U/L (ref 38–126)
Anion gap: 7 (ref 5–15)
BUN: 25 mg/dL — ABNORMAL HIGH (ref 8–23)
CO2: 28 mmol/L (ref 22–32)
Calcium: 8.6 mg/dL — ABNORMAL LOW (ref 8.9–10.3)
Chloride: 106 mmol/L (ref 98–111)
Creatinine, Ser: 0.59 mg/dL (ref 0.44–1.00)
GFR, Estimated: >60 mL/min (ref >60)
Glucose, Bld: 95 mg/dL (ref 70–99)
Potassium: 3.6 mmol/L (ref 3.5–5.1)
Sodium: 141 mmol/L (ref 135–145)
Total Bilirubin: 0.7 mg/dL (ref 0.0–1.2)
Total Protein: 6.5 g/dL (ref 6.5–8.1)

## 2023-04-21 LAB — URINALYSIS, ROUTINE W REFLEX MICROSCOPIC
Bilirubin Urine: NEGATIVE
Glucose, UA: NEGATIVE mg/dL
Hgb urine dipstick: NEGATIVE
Leukocytes,Ua: NEGATIVE
Nitrite: NEGATIVE
Specific Gravity, Urine: 1.027 (ref 1.005–1.030)
pH: 5.5 (ref 5.0–8.0)

## 2023-04-29 ENCOUNTER — Other Ambulatory Visit: Payer: Self-pay | Admitting: Family Medicine

## 2023-04-29 NOTE — Telephone Encounter (Signed)
 [name],   I saw this patient a few months ago for knee pain, and I gave her some Celebrex to take.  I would feel comfortable for her to take it long-term, but I know other people would disagree.  Her renal and liver function are normal.  I wanted to double-check with you to make sure you are comfortable with that.

## 2023-04-29 NOTE — Telephone Encounter (Signed)
 Last office visit 02/10/2023 for CPE with Dr. Para March.  Last refilled 01/20/2023 for #30 with 2 refills by Dr. Patsy Lager.  Next Appt:  No future appointments with PCP or Dr. Para March.

## 2023-04-30 NOTE — Progress Notes (Unsigned)
 Assessment/Plan:    1.  Parkinsons Disease with dyskinesia  -Currently on Rytary patient assistance.  She will continue Rytary 145 mg, 1 capsule at 7 AM/11 AM/3 PM/7 PM.  She used to be in 195 mg but seems to be doing well on 145 mg currently  -dyskinesia is markedly improved             -continue amantadine 100 mg tid  -We discussed Vyalev, which is foscarbidopa/foslevodopa pump that is newly FDA approved.  We discussed that it is for motor fluctuations in adults with advanced Parkinson's disease.  We discussed that this likely will not be on Medicare formulary until the latter half of 2025.  We discussed risks and benefits of this drug.              -needs to use walker at all times and we will check on smaller walker for her through a nonprofit   2.  Sialorrhea             -back on xeomin injections   3.  RBD             -Continue clonazepam, 0.5 mg, half tablet at bedtime.               -Use bed rails.   4.  Constipation with what sounds like overflow incontinence             -saw GI NP - Treatment with benefiber recommended and helping   5.  Depression             -On venlafaxine.  Subjective:   [name] was seen today in follow up for Parkinsons disease.  Daughter in law supplements hx.  My previous records were reviewed prior to todays visit as well as outside records available to me.   When I saw the patient last visit, we did try to get her put on either Rytary or [employer] patient assistance.  She was ultimately approved for Rytary patient assistance.  My intention was for her to get the Rytary 195 mg, but they ended up sending her Rytary 145 mg.  She is taking that at 7 AM/11 AM/3 PM/7 PM.  She is tolerating that well, without side effects.  She is having less dyskinesia.  She was in the emergency room March 26 after a fall; she tripped over a chainsaw on the kitchen floor (her boyfriend has had things stolen and therefore leaves his 3 chainsaws on the kitchen  floors).  Emergency room workup was negative, including CT of the brain.  She was released from the emergency room.  She also fell yesterday backing out of the closet.  She didn't have the walker then.    Current prescribed movement disorder medications: Rytary 145 mg at 7 AM/11 AM/3 PM/7 PM Amantadine, 100 mg tid Clonazepam 0.5 mg, half tablet at bed   PREVIOUS MEDICATIONS: has clonazepam but rarely takes; Rytary (helped to decrease dyskinesia and had same amount of on time but was too costly)  ALLERGIES:   Allergies  Allergen Reactions   Penicillins Shortness Of Breath and Itching    CURRENT MEDICATIONS:  Outpatient Encounter Medications as of 05/03/2023  Medication Sig   Calcium 1500 MG tablet Take 1,500 mg by mouth.   celecoxib (CELEBREX) 200 MG capsule Take 1 capsule (200 mg total) by mouth every morning.   clonazePAM (KLONOPIN) 0.5 MG tablet TAKE 1/2 TABLET BY MOUTH AT BEDTIME   Denosumab (PROLIA [place]) Inject  into the skin every 6 (six) months.   Elastic Bandages & Supports (ABDOMINAL BINDER/ELASTIC SMALL) MISC 1 Device by Does not apply route daily.   Multiple Vitamin (MULTIVITAMIN) capsule 2 tabs po qd   omeprazole (PRILOSEC) 20 MG capsule TAKE ONE CAPSULE BY MOUTH ONCE DAILY   OnabotulinumtoxinA (BOTOX IM) Inject into the muscle every 3 (three) months. Injection every 3 months   venlafaxine XR (EFFEXOR-XR) 37.5 MG 24 hr capsule TAKE ONE CAPSULE BY MOUTH EVERY MORNING with breakfast   venlafaxine XR (EFFEXOR-XR) 75 MG 24 hr capsule TAKE ONE CAPSULE BY MOUTH EVERY DAY   [DISCONTINUED] amantadine (SYMMETREL) 100 MG capsule Take 1 capsule (100 mg total) by mouth 3 (three) times daily. 7am/11am/4pm   amantadine (SYMMETREL) 100 MG capsule Take 1 capsule (100 mg total) by mouth 3 (three) times daily. 7am/11am/4pm   b complex vitamins tablet Take 1 tablet by mouth daily. (Patient not taking: Reported on 05/03/2023)   Carbidopa-Levodopa ER (SINEMET CR) 25-100 MG tablet controlled release 1.5  tabs at 7am/11am/3pm/7pm (Patient not taking: Reported on 05/03/2023)   Cholecalciferol 5000 units TABS Take 1 tablet (5,000 Units total) by mouth daily. (Patient not taking: Reported on 05/03/2023)   methocarbamol (ROBAXIN) 500 MG tablet Take 1 tablet (500 mg total) by mouth every 8 (eight) hours as needed for muscle spasms (sedation caution.). (Patient not taking: Reported on 05/03/2023)   Omega-3 Fatty Acids (OMEGA 3 PO) 1 tab po qd (Patient not taking: Reported on 05/03/2023)   vitamin B-12 (CYANOCOBALAMIN) 500 MCG tablet Take 2500 mcg daily. (Patient not taking: Reported on 05/03/2023)   [DISCONTINUED] celecoxib (CELEBREX) 200 MG capsule Take 1 capsule (200 mg total) by mouth daily.   Facility-Administered Encounter Medications as of 05/03/2023  Medication   incobotulinumtoxinA (XEOMIN) 100 units injection 100 Units   incobotulinumtoxinA (XEOMIN) 100 units injection 100 Units   incobotulinumtoxinA (XEOMIN) 100 units injection 100 Units    Objective:   PHYSICAL EXAMINATION:    VITALS:   Vitals:   05/03/23 0810  BP: (!) 148/100  Pulse: 79  SpO2: 99%  Weight: 102 lb (46.3 kg)  Height: 5\' 3"  (1.6 m)    GEN:  The patient appears stated age and is in NAD. HEENT:  Normocephalic, atraumatic.  The mucous membranes are moist. The superficial temporal arteries are without ropiness or tenderness. CV:  RRR Lungs:  CTAB  Neurological examination:  Orientation: The patient is alert and oriented x3. Cranial nerves: There is good facial symmetry with facial hypomimia. There is R esotropia but it fluctuates (denies diplopia).  The speech is fluent and hypophonic. Soft palate rises symmetrically and there is no tongue deviation. Hearing is intact to conversational tone. Sensation: Sensation is intact to light touch throughout Motor: Strength is at least antigravity x4.  Movement examination: Tone: There is nl tone in the UE/LE Abnormal movements: there is L>RUE rest tremor and R foot tremor, low  amplitude, intermittent Coordination:  There is mild decremation with hand opening and closing on the L Gait and Station: The patient pushes off to arise.  She ambulates with her walker and does well with this.    Total time spent on today's visit was 30 minutes, including both face-to-face time and nonface-to-face time.  Time included that spent on review of records (prior notes available to me/labs/imaging if pertinent), discussing treatment and goals, answering patient's questions and coordinating care.  Cc:  Joaquim Nam, MD

## 2023-05-01 MED ORDER — CELECOXIB 200 MG PO CAPS: 200.0000 mg | ORAL_CAPSULE | Freq: Every morning | ORAL | 2 refills | Status: DC

## 2023-05-01 NOTE — Addendum Note (Signed)
 Addended by: Joaquim Nam on: 05/01/2023 07:03 PM   Modules accepted: Orders

## 2023-05-01 NOTE — Telephone Encounter (Signed)
 Agreed and thank you.  I sent the rx.

## 2023-05-03 ENCOUNTER — Ambulatory Visit (INDEPENDENT_AMBULATORY_CARE_PROVIDER_SITE_OTHER): Payer: Medicare Other | Admitting: Neurology

## 2023-05-03 ENCOUNTER — Encounter: Payer: Self-pay | Admitting: Neurology

## 2023-05-03 VITALS — BP 148/100 | HR 79 | Ht 63.0 in | Wt 102.0 lb

## 2023-05-03 DIAGNOSIS — G20B2 Parkinson's disease with dyskinesia, with fluctuations: Secondary | ICD-10-CM | POA: Diagnosis not present

## 2023-05-03 MED ORDER — AMANTADINE HCL 100 MG PO CAPS: 100.0000 mg | ORAL_CAPSULE | Freq: Three times a day (TID) | ORAL | 1 refills | Status: DC

## 2023-05-03 NOTE — Patient Instructions (Signed)
We discussed a nonprofit medical company called [employer].  They have all kinds of medical equipment including wheelchairs, walkers, canes and incontinence materials, which is free of charge.  Their phone number is [phone].  Their email is [email or web address].  Feel free to reach out to them with questions.  I believe that they are only open for pick up on Tuesdays but you can contact them other days of the week.

## 2023-05-05 ENCOUNTER — Inpatient Hospital Stay: Payer: Medicare Other | Attending: Physician Assistant

## 2023-05-05 ENCOUNTER — Inpatient Hospital Stay: Payer: Medicare Other

## 2023-05-05 VITALS — BP 137/85 | HR 80 | Temp 97.8°F | Resp 16

## 2023-05-05 DIAGNOSIS — C50011 Malignant neoplasm of nipple and areola, right female breast: Secondary | ICD-10-CM | POA: Diagnosis not present

## 2023-05-05 DIAGNOSIS — Z79899 Other long term (current) drug therapy: Secondary | ICD-10-CM | POA: Diagnosis not present

## 2023-05-05 DIAGNOSIS — D472 Monoclonal gammopathy: Secondary | ICD-10-CM

## 2023-05-05 LAB — CBC WITH DIFFERENTIAL (CANCER CENTER ONLY)
Abs Immature Granulocytes: 0.03 10*3/uL (ref 0.00–0.07)
Basophils Absolute: 0.1 10*3/uL (ref 0.0–0.1)
Basophils Relative: 1 %
Eosinophils Absolute: 0.2 10*3/uL (ref 0.0–0.5)
Eosinophils Relative: 2 %
HCT: 41.5 % (ref 36.0–46.0)
Hemoglobin: 13.6 g/dL (ref 12.0–15.0)
Immature Granulocytes: 1 %
Lymphocytes Relative: 22 %
Lymphs Abs: 1.5 10*3/uL (ref 0.7–4.0)
MCH: 33.2 pg (ref 26.0–34.0)
MCHC: 32.8 g/dL (ref 30.0–36.0)
MCV: 101.2 fL — ABNORMAL HIGH (ref 80.0–100.0)
Monocytes Absolute: 0.3 10*3/uL (ref 0.1–1.0)
Monocytes Relative: 5 %
Neutro Abs: 4.6 10*3/uL (ref 1.7–7.7)
Neutrophils Relative %: 69 %
Platelet Count: 175 10*3/uL (ref 150–400)
RBC: 4.1 MIL/uL (ref 3.87–5.11)
RDW: 13.3 % (ref 11.5–15.5)
WBC Count: 6.6 10*3/uL (ref 4.0–10.5)
nRBC: 0 % (ref 0.0–0.2)

## 2023-05-05 LAB — CMP (CANCER CENTER ONLY)
ALT: <5 U/L (ref 0–44)
AST: 25 U/L (ref 15–41)
Albumin: 4.2 g/dL (ref 3.5–5.0)
Alkaline Phosphatase: 93 U/L (ref 38–126)
Anion gap: 6 (ref 5–15)
BUN: 17 mg/dL (ref 8–23)
CO2: 32 mmol/L (ref 22–32)
Calcium: 9.6 mg/dL (ref 8.9–10.3)
Chloride: 104 mmol/L (ref 98–111)
Creatinine: 0.78 mg/dL (ref 0.44–1.00)
GFR, Estimated: >60 mL/min (ref >60)
Glucose, Bld: 95 mg/dL (ref 70–99)
Potassium: 3.9 mmol/L (ref 3.5–5.1)
Sodium: 142 mmol/L (ref 135–145)
Total Bilirubin: 0.6 mg/dL (ref 0.0–1.2)
Total Protein: 7.6 g/dL (ref 6.5–8.1)

## 2023-05-05 LAB — LACTATE DEHYDROGENASE: LDH: 209 U/L — ABNORMAL HIGH (ref 98–192)

## 2023-05-05 MED ORDER — DENOSUMAB 60 MG/ML ~~LOC~~ SOSY
60.0000 mg | PREFILLED_SYRINGE | Freq: Once | SUBCUTANEOUS | Status: AC
  Administered 2023-05-05: 60 mg via SUBCUTANEOUS
  Filled 2023-05-05: qty 1

## 2023-05-06 LAB — BETA 2 MICROGLOBULIN, SERUM: Beta-2 Microglobulin: 1.8 mg/L (ref 0.6–2.4)

## 2023-05-06 LAB — IGG, IGA, IGM
IgA: 51 mg/dL — ABNORMAL LOW (ref 64–422)
IgG (Immunoglobin G), Serum: 1636 mg/dL — ABNORMAL HIGH (ref 586–1602)
IgM (Immunoglobulin M), Srm: 22 mg/dL — ABNORMAL LOW (ref 26–217)

## 2023-05-06 LAB — KAPPA/LAMBDA LIGHT CHAINS
Kappa free light chain: 125.3 mg/L — ABNORMAL HIGH (ref 3.3–19.4)
Kappa, lambda light chain ratio: 9.64 — ABNORMAL HIGH (ref 0.26–1.65)
Lambda free light chains: 13 mg/L (ref 5.7–26.3)

## 2023-05-10 NOTE — Telephone Encounter (Signed)
 ERROR

## 2023-05-11 ENCOUNTER — Telehealth: Payer: Self-pay | Admitting: Licensed Clinical Social Worker

## 2023-05-11 NOTE — Telephone Encounter (Signed)
 Called patient to notify about smaller walker. Unable to leave a VM box full.

## 2023-05-12 ENCOUNTER — Telehealth: Payer: Self-pay | Admitting: Licensed Clinical Social Worker

## 2023-05-12 NOTE — Telephone Encounter (Signed)
 I was finally able to reach her. She plans to pick up the walker.

## 2023-05-16 ENCOUNTER — Telehealth: Payer: Self-pay | Admitting: Neurology

## 2023-05-16 NOTE — Telephone Encounter (Signed)
 Pt called in over the weekend twice and left messages. Stated she was returning a call and requesting to speak with someone.

## 2023-05-16 NOTE — Telephone Encounter (Signed)
Called patient unable to leave message mail box is full.

## 2023-05-23 ENCOUNTER — Other Ambulatory Visit: Payer: Self-pay | Admitting: Family Medicine

## 2023-05-24 NOTE — Telephone Encounter (Signed)
 LOV: 02/10/23 NOV: NOTHING SCHEDULED LAST REFILL: methocarbamol  500 mg tablet 02/10/23 30 TABLETS 2 REFILLS

## 2023-06-01 DIAGNOSIS — Z1231 Encounter for screening mammogram for malignant neoplasm of breast: Secondary | ICD-10-CM | POA: Diagnosis not present

## 2023-06-01 LAB — HM MAMMOGRAPHY

## 2023-06-02 ENCOUNTER — Encounter: Payer: Self-pay | Admitting: Family Medicine

## 2023-06-04 ENCOUNTER — Emergency Department (HOSPITAL_BASED_OUTPATIENT_CLINIC_OR_DEPARTMENT_OTHER)

## 2023-06-04 ENCOUNTER — Emergency Department (HOSPITAL_BASED_OUTPATIENT_CLINIC_OR_DEPARTMENT_OTHER)
Admission: EM | Admit: 2023-06-04 | Discharge: 2023-06-04 | Disposition: A | Discharge status: Home / Self Care | Attending: Emergency Medicine | Admitting: Emergency Medicine

## 2023-06-04 ENCOUNTER — Emergency Department (HOSPITAL_BASED_OUTPATIENT_CLINIC_OR_DEPARTMENT_OTHER): Admitting: Radiology

## 2023-06-04 ENCOUNTER — Encounter (HOSPITAL_BASED_OUTPATIENT_CLINIC_OR_DEPARTMENT_OTHER): Payer: Self-pay | Admitting: *Deleted

## 2023-06-04 ENCOUNTER — Other Ambulatory Visit: Payer: Self-pay

## 2023-06-04 DIAGNOSIS — R519 Headache, unspecified: Secondary | ICD-10-CM | POA: Insufficient documentation

## 2023-06-04 DIAGNOSIS — R441 Visual hallucinations: Secondary | ICD-10-CM | POA: Diagnosis not present

## 2023-06-04 DIAGNOSIS — G20C Parkinsonism, unspecified: Secondary | ICD-10-CM | POA: Diagnosis not present

## 2023-06-04 DIAGNOSIS — R531 Weakness: Secondary | ICD-10-CM | POA: Diagnosis not present

## 2023-06-04 DIAGNOSIS — M542 Cervicalgia: Secondary | ICD-10-CM | POA: Insufficient documentation

## 2023-06-04 DIAGNOSIS — S0990XA Unspecified injury of head, initial encounter: Secondary | ICD-10-CM | POA: Diagnosis not present

## 2023-06-04 DIAGNOSIS — I6529 Occlusion and stenosis of unspecified carotid artery: Secondary | ICD-10-CM | POA: Diagnosis not present

## 2023-06-04 DIAGNOSIS — S199XXA Unspecified injury of neck, initial encounter: Secondary | ICD-10-CM | POA: Diagnosis not present

## 2023-06-04 DIAGNOSIS — R443 Hallucinations, unspecified: Secondary | ICD-10-CM

## 2023-06-04 DIAGNOSIS — R4182 Altered mental status, unspecified: Secondary | ICD-10-CM | POA: Diagnosis not present

## 2023-06-04 LAB — URINALYSIS, W/ REFLEX TO CULTURE (INFECTION SUSPECTED)
Bacteria, UA: NONE SEEN
Bilirubin Urine: NEGATIVE
Glucose, UA: NEGATIVE mg/dL
Hgb urine dipstick: NEGATIVE
Ketones, ur: NEGATIVE mg/dL
Leukocytes,Ua: NEGATIVE
Nitrite: NEGATIVE
Specific Gravity, Urine: 1.024 (ref 1.005–1.030)
pH: 5.5 (ref 5.0–8.0)

## 2023-06-04 LAB — COMPREHENSIVE METABOLIC PANEL WITH GFR
ALT: 21 U/L (ref 0–44)
AST: 29 U/L (ref 15–41)
Albumin: 4.3 g/dL (ref 3.5–5.0)
Alkaline Phosphatase: 92 U/L (ref 38–126)
Anion gap: 12 (ref 5–15)
BUN: 29 mg/dL — ABNORMAL HIGH (ref 8–23)
CO2: 26 mmol/L (ref 22–32)
Calcium: 9.8 mg/dL (ref 8.9–10.3)
Chloride: 103 mmol/L (ref 98–111)
Creatinine, Ser: 1.03 mg/dL — ABNORMAL HIGH (ref 0.44–1.00)
GFR, Estimated: 54 mL/min — ABNORMAL LOW (ref >60)
Glucose, Bld: 105 mg/dL — ABNORMAL HIGH (ref 70–99)
Potassium: 4 mmol/L (ref 3.5–5.1)
Sodium: 140 mmol/L (ref 135–145)
Total Bilirubin: 0.4 mg/dL (ref 0.0–1.2)
Total Protein: 7.5 g/dL (ref 6.5–8.1)

## 2023-06-04 LAB — CBC WITH DIFFERENTIAL/PLATELET
Abs Immature Granulocytes: 0 10*3/uL (ref 0.00–0.07)
Basophils Absolute: 0 10*3/uL (ref 0.0–0.1)
Basophils Relative: 1 %
Eosinophils Absolute: 0.2 10*3/uL (ref 0.0–0.5)
Eosinophils Relative: 4 %
HCT: 38 % (ref 36.0–46.0)
Hemoglobin: 12.6 g/dL (ref 12.0–15.0)
Immature Granulocytes: 0 %
Lymphocytes Relative: 28 %
Lymphs Abs: 1.6 10*3/uL (ref 0.7–4.0)
MCH: 33.6 pg (ref 26.0–34.0)
MCHC: 33.2 g/dL (ref 30.0–36.0)
MCV: 101.3 fL — ABNORMAL HIGH (ref 80.0–100.0)
Monocytes Absolute: 0.4 10*3/uL (ref 0.1–1.0)
Monocytes Relative: 7 %
Neutro Abs: 3.6 10*3/uL (ref 1.7–7.7)
Neutrophils Relative %: 60 %
Platelets: 212 10*3/uL (ref 150–400)
RBC: 3.75 MIL/uL — ABNORMAL LOW (ref 3.87–5.11)
RDW: 13.7 % (ref 11.5–15.5)
WBC: 5.8 10*3/uL (ref 4.0–10.5)
nRBC: 0 % (ref 0.0–0.2)

## 2023-06-04 MED ORDER — SODIUM CHLORIDE 0.9 % IV BOLUS
500.0000 mL | Freq: Once | INTRAVENOUS | Status: AC
  Administered 2023-06-04: 500 mL via INTRAVENOUS

## 2023-06-04 NOTE — ED Provider Notes (Signed)
 [place] [facility] AT [facility] Provider Note   CSN: [id number] Arrival date & time: 06/04/23  1722     History  Chief Complaint  Patient presents with   Hallucinations    [name] is a 83 y.o. female history of Parkinson's, here presenting with hallucinations.  Patient was recently started on Rytary  by neurology for Parkinson's.  Patient states that for the last week or 2 she noticed visual hallucinations.  She states that she sees shadows of people coming to her house.  She states that they do not talk to her.  She states that she is afraid of them.  She does fall frequently and last fall was about a week ago.  She states that she has some neck pain and headache after the fall.  Denies any fevers or chills.  The history is provided by the patient.       Home Medications Prior to Admission medications   Medication Sig Start Date End Date Taking? Authorizing Provider  amantadine  (SYMMETREL ) 100 MG capsule Take 1 capsule (100 mg total) by mouth 3 (three) times daily. 7am/11am/4pm 05/03/23   Tat, Von Grumbling, DO  b complex vitamins tablet Take 1 tablet by mouth daily. Patient not taking: Reported on 05/03/2023    [provider]  Calcium 1500 MG tablet Take 1,500 mg by mouth.    [provider]  Carbidopa -Levodopa  ER (SINEMET  CR) 25-100 MG tablet controlled release 1.5 tabs at 7am/11am/3pm/7pm Patient not taking: Reported on 05/03/2023 12/20/22   Tat, Von Grumbling, DO  celecoxib  (CELEBREX ) 200 MG capsule Take 1 capsule (200 mg total) by mouth every morning. 05/01/23   Donnie Galea, MD  Cholecalciferol  5000 units TABS Take 1 tablet (5,000 Units total) by mouth daily. Patient not taking: Reported on 05/03/2023 01/23/17   Donnie Galea, MD  clonazePAM  (KLONOPIN ) 0.5 MG tablet TAKE 1/2 TABLET BY MOUTH AT BEDTIME 02/14/23   Tat, Von Grumbling, DO  Denosumab  (PROLIA  [place]) Inject into the skin every 6 (six) months.    [provider]  Elastic  Bandages & Supports (ABDOMINAL BINDER/ELASTIC SMALL) MISC 1 Device by Does not apply route daily. 10/15/15   Tat, Von Grumbling, DO  methocarbamol  (ROBAXIN ) 500 MG tablet TAKE ONE TABLET BY MOUTH EVERY 8 HOURS AS NEEDED FOR MUSCLE SPASMS (SEDATION CAUTION) 05/25/23   Donnie Galea, MD  Multiple Vitamin (MULTIVITAMIN) capsule 2 tabs po qd    [provider]  Omega-3 Fatty Acids (OMEGA 3 PO) 1 tab po qd Patient not taking: Reported on 05/03/2023    [provider]  omeprazole  (PRILOSEC ) 20 MG capsule TAKE ONE CAPSULE BY MOUTH ONCE DAILY 02/14/23   Donnie Galea, MD  OnabotulinumtoxinA (BOTOX IM) Inject into the muscle every 3 (three) months. Injection every 3 months    [provider]  venlafaxine  XR (EFFEXOR -XR) 37.5 MG 24 hr capsule TAKE ONE CAPSULE BY MOUTH EVERY MORNING with breakfast 04/29/23   Donnie Galea, MD  venlafaxine  XR (EFFEXOR -XR) 75 MG 24 hr capsule TAKE ONE CAPSULE BY MOUTH EVERY DAY 04/21/23   Donnie Galea, MD  vitamin B-12 (CYANOCOBALAMIN ) 500 MCG tablet Take 2500 mcg daily. Patient not taking: Reported on 05/03/2023    [provider]      Allergies    Penicillins    Review of Systems   Review of Systems  Psychiatric/Behavioral:  Positive for hallucinations.   All other systems reviewed and are negative.   Physical Exam Updated Vital  Signs BP (!) 133/108 (BP Location: Left Arm)   Pulse 87   Temp 98 F (36.7 C) (Oral)   Resp 18   SpO2 100%  Physical Exam Vitals and nursing note reviewed.  Constitutional:      Appearance: Normal appearance.  HENT:     Head: Normocephalic.     Nose: Nose normal.     Mouth/Throat:     Mouth: Mucous membranes are moist.  Eyes:     Extraocular Movements: Extraocular movements intact.     Pupils: Pupils are equal, round, and reactive to light.  Neck:     Comments: Mild paracervical tenderness. Cardiovascular:     Rate and Rhythm: Normal rate and regular rhythm.     Pulses: Normal pulses.      Heart sounds: Normal heart sounds.  Pulmonary:     Effort: Pulmonary effort is normal.     Breath sounds: Normal breath sounds.  Abdominal:     General: Abdomen is flat.     Palpations: Abdomen is soft.  Musculoskeletal:        General: Normal range of motion.     Cervical back: Normal range of motion.  Skin:    General: Skin is warm.     Capillary Refill: Capillary refill takes less than 2 seconds.  Neurological:     General: No focal deficit present.     Mental Status: She is alert and oriented to person, place, and time.  Psychiatric:        Mood and Affect: Mood normal.        Behavior: Behavior normal.     Comments: No tangential thought and good judgment     ED Results / Procedures / Treatments   Labs (all labs ordered are listed, but only abnormal results are displayed) Labs Reviewed  CBC WITH DIFFERENTIAL/PLATELET - Abnormal; Notable for the following components:      Result Value   RBC 3.75 (*)    MCV 101.3 (*)    All other components within normal limits  COMPREHENSIVE METABOLIC PANEL WITH GFR  URINALYSIS, W/ REFLEX TO CULTURE (INFECTION SUSPECTED)    EKG None  Radiology No results found.  Procedures Procedures    Medications Ordered in ED Medications  sodium chloride  0.9 % bolus 500 mL (has no administration in time range)    ED Course/ Medical Decision Making/ A&P                                 Medical Decision Making [name] is a 83 y.o. female here presenting with visual hallucinations.  Likely medication side effect from Rytary .  Will get CT head to rule out a bleed.  Consider electrolyte abnormality or UTI or pneumonia.  Plan to get CBC and CMP and UA and chest x-ray and CT head.  8:49 PM Reviewed patient's labs and they were unremarkable.  UA is normal.  CT head and cervical spine showed no acute process.  Patient follows with Dr. Winferd Hatter from neurology and I recommend that she call neurology on Monday to discuss medication  adjustment.  Problems Addressed: Hallucination: acute illness or injury  Amount and/or Complexity of Data Reviewed Labs: ordered. Decision-making details documented in ED Course. Radiology: ordered.    Final Clinical Impression(s) / ED Diagnoses Final diagnoses:  None    Rx / DC Orders ED Discharge Orders     None  Dalene Duck, MD 06/04/23 [phone]

## 2023-06-04 NOTE — ED Notes (Signed)
Dr. Yao at bedside. 

## 2023-06-04 NOTE — Discharge Instructions (Addendum)
 You likely have medication side effect from Rytary .  I recommend you call your neurologist on Monday to discuss dose adjustment  Return to ER if you have worse hallucinations or hearing voices

## 2023-06-04 NOTE — ED Notes (Signed)
 RN reviewed discharge instructions with pt. Pt verbalized understanding and had no further questions. VSS upon discharge.

## 2023-06-04 NOTE — ED Triage Notes (Addendum)
 Patient to ED POV reporting hallucinations x 1 week. Patient has had this in the past with previous medications. Patient also reporting more frequent headaches.   Pt reporting she sees people that she is scared of because they "don't look right" "like dead people on TV" She denies that they talk to her.

## 2023-06-06 ENCOUNTER — Telehealth: Payer: Self-pay | Admitting: Neurology

## 2023-06-06 NOTE — Telephone Encounter (Signed)
 Called patients son and they have moved patient t ohis house and making sure she is taking her meds properly. They believe she hasn't been taking them right. She has had no hallucinations. They will monitor her and let us  know if the hallucinations come back. They have taken her to ED and had a Ctscan and a uti test completed

## 2023-06-06 NOTE — Telephone Encounter (Signed)
 Called son left VM

## 2023-06-06 NOTE — Telephone Encounter (Signed)
 Pt's son called in and left a message with the after hours service. Son states pt has had hallucinations for the last week. Son states pt states pt is seeing people who are not there. Today pt told son garage and tractor was moved by the people, but it was not moved.   Full report is in Dr. Jamel Mc box

## 2023-06-06 NOTE — Telephone Encounter (Signed)
 Patient son was returning a call to [facility]  please call back

## 2023-06-07 ENCOUNTER — Telehealth: Payer: Self-pay | Admitting: Neurology

## 2023-06-07 NOTE — Telephone Encounter (Signed)
 Pt. Husband says they will stop taking Rx amantadine  as hallucinations returned

## 2023-06-09 ENCOUNTER — Telehealth: Payer: Self-pay | Admitting: Neurology

## 2023-06-09 NOTE — Telephone Encounter (Signed)
 Pt. Husband said Pt. Has had changes and he doesn't know what to do anymore and needs advice

## 2023-06-09 NOTE — Telephone Encounter (Signed)
 Patients son calling. He has moved his mom into his home to help make sure she is taking medication correctly. He has noticed some cognitive decline for example she doesn't remember the shape or color of her medications that she has been on for two years. She also didn't remember talking on the phone to her friend that is in a facility now and thought he was there in the home with them. Patient having some mild hallucinations since stopping Amantadine 

## 2023-06-10 ENCOUNTER — Encounter: Payer: Self-pay | Admitting: Neurology

## 2023-06-10 ENCOUNTER — Ambulatory Visit (INDEPENDENT_AMBULATORY_CARE_PROVIDER_SITE_OTHER): Admitting: Neurology

## 2023-06-10 VITALS — BP 122/74 | HR 63 | Wt 96.9 lb

## 2023-06-10 DIAGNOSIS — G20A1 Parkinson's disease without dyskinesia, without mention of fluctuations: Secondary | ICD-10-CM

## 2023-06-10 DIAGNOSIS — F0280 Dementia in other diseases classified elsewhere without behavioral disturbance: Secondary | ICD-10-CM

## 2023-06-10 DIAGNOSIS — G20B2 Parkinson's disease with dyskinesia, with fluctuations: Secondary | ICD-10-CM | POA: Diagnosis not present

## 2023-06-10 DIAGNOSIS — E860 Dehydration: Secondary | ICD-10-CM | POA: Diagnosis not present

## 2023-06-10 MED ORDER — RYTARY 36.25-145 MG PO CPCR: ORAL_CAPSULE | ORAL | Status: DC

## 2023-06-10 MED ORDER — NUPLAZID 34 MG PO CAPS: 1.0000 | ORAL_CAPSULE | Freq: Every day | ORAL | Status: DC

## 2023-06-10 NOTE — Patient Instructions (Signed)
 Use your walker at all times Increase water to 50 oz per day Take nuplazid 34 mg daily Make an appointment with Dr. Vallarie Gauze We made a referral to [facility] (social work)  The physicians and staff at [facility] are committed to providing excellent care. You may receive a survey requesting feedback about your experience at our office. We strive to receive "very good" responses to the survey questions. If you feel that your experience would prevent you from giving the office a "very good " response, please contact our office to try to remedy the situation. We may be reached at [phone]. Thank you for taking the time out of your busy day to complete the survey.

## 2023-06-10 NOTE — Progress Notes (Signed)
 Assessment/Plan:    1.  Parkinsons Disease with dyskinesia and memory change  -Currently on Rytary  patient assistance.  She will continue Rytary  145 mg, 1 capsule at 7 AM/11 AM/3 PM/7 PM.  She used to be in 195 mg but seems to be doing well on 145 mg currently  - Amantadine  has been discontinued  - I suspect that part of the issue with increasing confusion (or perceived increase) is that patient's significant other is now in a facility and this generated increased stress along with fact that daughter has CA.  There has also, therefore, been increased family involvement, and they are now able to see what is going on in detail.  -referral to [facility] - family is available but has to work and they worry about medication and leaving alone in the day.  Discussed [facility] but family leaves for work earlier than the daycenter opens  -walker at all times.  We supplied her with a walker and we want her using it    2.  Sialorrhea             -back on xeomin  injections   3.  RBD             -Continue clonazepam , 0.5 mg, half tablet at bedtime.               -Use bed rails.   4.  Constipation with what sounds like overflow incontinence             -saw GI NP - Treatment with benefiber recommended and helping   5.  Depression             -On venlafaxine .  6.  Dehydration  -needs to increase fluids  -make f/u pcp  7.  Parkinsons hallucinations  -start nuplazid, 34 mg daily.  Discussed r/b/se including the black box warning.    Subjective:   [name] was seen today in follow up for Parkinsons disease.  Son accompanies pt and supplements hx.  Pt seen last month at our office for appt. they called 4 days ago with hallucinations and I told them to stop the amantadine .  They just stopped that wed AM (its curently Friday AM).  I told them that if hallucinations persist, they needed to follow-up with primary care to make sure she did not have a urinary tract infection, etc. They  report she is still confused.  She states today that she sees people in the home and it scares her.  Her son moved the patient into his home to make sure she was taking her medication correctly.  Her long-term boyfriend was moved into the facility, which has been a great stress.  She is also under stressed because her daughter has cancer.   She is out of the hospital now.  She has also had deaths in the family.  She has been having trouble finding the bathroom in the sons house (she's been there in the past but never stayed there before like she is now).  She was in the emergency room on May 10 because of the hallucinations (prior to the discontinuation of amantadine ).  Her baseline creatinine was elevated from 0.78-1.03.  her son states that they cannot be there 24 hours per day as they have to work, even if patient lives there.  When she went to the ER, they did do a brain scan but since that time, she did fall and she hit her head between  the toilet and bathtub without LOC.  Things haven't been worse mentally since that time (although clearly not at baseline).    Current prescribed movement disorder medications: Rytary  145 mg at 7 AM/11 AM/3 PM/7 PM Amantadine , 100 mg tid Clonazepam  0.5 mg, half tablet at bed   PREVIOUS MEDICATIONS: has clonazepam  but rarely takes; Rytary  (helped to decrease dyskinesia and had same amount of on time but was too costly)  ALLERGIES:   Allergies  Allergen Reactions   Penicillins Shortness Of Breath and Itching    CURRENT MEDICATIONS:  Outpatient Encounter Medications as of 06/10/2023  Medication Sig   Calcium 1500 MG tablet Take 1,500 mg by mouth.   Carbidopa -Levodopa  ER (RYTARY ) 36.25-145 MG CPCR 1 capsule at 7 AM/11 AM/3 PM/7 PM   celecoxib  (CELEBREX ) 200 MG capsule Take 1 capsule (200 mg total) by mouth every morning.   clonazePAM  (KLONOPIN ) 0.5 MG tablet TAKE 1/2 TABLET BY MOUTH AT BEDTIME   Denosumab  (PROLIA  [place]) Inject into the skin every 6 (six) months.    Elastic Bandages & Supports (ABDOMINAL BINDER/ELASTIC SMALL) MISC 1 Device by Does not apply route daily.   methocarbamol  (ROBAXIN ) 500 MG tablet TAKE ONE TABLET BY MOUTH EVERY 8 HOURS AS NEEDED FOR MUSCLE SPASMS (SEDATION CAUTION)   Multiple Vitamin (MULTIVITAMIN) capsule 2 tabs po qd   omeprazole  (PRILOSEC ) 20 MG capsule TAKE ONE CAPSULE BY MOUTH ONCE DAILY   OnabotulinumtoxinA (BOTOX IM) Inject into the muscle every 3 (three) months. Injection every 3 months   venlafaxine  XR (EFFEXOR -XR) 37.5 MG 24 hr capsule TAKE ONE CAPSULE BY MOUTH EVERY MORNING with breakfast   venlafaxine  XR (EFFEXOR -XR) 75 MG 24 hr capsule TAKE ONE CAPSULE BY MOUTH EVERY DAY   [DISCONTINUED] b complex vitamins tablet Take 1 tablet by mouth daily. (Patient not taking: Reported on 06/10/2023)   [DISCONTINUED] Carbidopa -Levodopa  ER (SINEMET  CR) 25-100 MG tablet controlled release 1.5 tabs at 7am/11am/3pm/7pm (Patient not taking: Reported on 06/10/2023)   [DISCONTINUED] Cholecalciferol  5000 units TABS Take 1 tablet (5,000 Units total) by mouth daily. (Patient not taking: Reported on 06/10/2023)   [DISCONTINUED] Omega-3 Fatty Acids (OMEGA 3 PO) 1 tab po qd (Patient not taking: Reported on 06/10/2023)   [DISCONTINUED] vitamin B-12 (CYANOCOBALAMIN ) 500 MCG tablet Take 2500 mcg daily. (Patient not taking: Reported on 06/10/2023)   Facility-Administered Encounter Medications as of 06/10/2023  Medication   incobotulinumtoxinA  (XEOMIN ) 100 units injection 100 Units   incobotulinumtoxinA  (XEOMIN ) 100 units injection 100 Units   incobotulinumtoxinA  (XEOMIN ) 100 units injection 100 Units    Objective:   PHYSICAL EXAMINATION:    VITALS:   Vitals:   06/10/23 0845  BP: 122/74  Pulse: 63  SpO2: 96%  Weight: 96 lb 14.4 oz (44 kg)     GEN:  The patient appears stated age and is in NAD. HEENT:  Normocephalic, atraumatic.  The mucous membranes are moist. The superficial temporal arteries are without ropiness or tenderness. CV:   RRR Lungs:  CTAB  Neurological examination:  Orientation: The patient is alert and oriented x3. Cranial nerves: There is good facial symmetry with facial hypomimia. There is R esotropia but it fluctuates (denies diplopia).  The speech is fluent and hypophonic. Soft palate rises symmetrically and there is no tongue deviation. Hearing is intact to conversational tone. Sensation: Sensation is intact to light touch throughout Motor: Strength is at least antigravity x4.  Movement examination: Tone: There is nl tone in the UE/LE Abnormal movements: there is no rest tremor.  There is mild LE  dyskinesia today Coordination:  There is mild decremation with hand opening and closing on the L Gait and Station: The patient pushes off to arise.  She ambulates with her cane and is a bit unsteady  Total time spent on today's visit was 63 minutes, including both face-to-face time and nonface-to-face time.  Time included that spent on review of records (prior notes available to me/labs/imaging if pertinent), discussing treatment and goals, answering patient's questions and coordinating care.  Cc:  Donnie Galea, MD

## 2023-06-13 ENCOUNTER — Observation Stay (HOSPITAL_BASED_OUTPATIENT_CLINIC_OR_DEPARTMENT_OTHER)
Admission: EM | Admit: 2023-06-13 | Discharge: 2023-06-23 | Disposition: A | Discharge status: Skilled Nursing Facility | Attending: Internal Medicine | Admitting: Internal Medicine

## 2023-06-13 ENCOUNTER — Emergency Department (HOSPITAL_BASED_OUTPATIENT_CLINIC_OR_DEPARTMENT_OTHER)

## 2023-06-13 ENCOUNTER — Other Ambulatory Visit: Payer: Self-pay

## 2023-06-13 ENCOUNTER — Encounter (HOSPITAL_BASED_OUTPATIENT_CLINIC_OR_DEPARTMENT_OTHER): Payer: Self-pay | Admitting: Emergency Medicine

## 2023-06-13 ENCOUNTER — Encounter: Payer: Self-pay | Admitting: Internal Medicine

## 2023-06-13 ENCOUNTER — Other Ambulatory Visit (HOSPITAL_BASED_OUTPATIENT_CLINIC_OR_DEPARTMENT_OTHER): Payer: Self-pay

## 2023-06-13 DIAGNOSIS — R946 Abnormal results of thyroid function studies: Secondary | ICD-10-CM | POA: Diagnosis not present

## 2023-06-13 DIAGNOSIS — F330 Major depressive disorder, recurrent, mild: Secondary | ICD-10-CM

## 2023-06-13 DIAGNOSIS — F32A Depression, unspecified: Secondary | ICD-10-CM | POA: Insufficient documentation

## 2023-06-13 DIAGNOSIS — K117 Disturbances of salivary secretion: Secondary | ICD-10-CM | POA: Insufficient documentation

## 2023-06-13 DIAGNOSIS — G9341 Metabolic encephalopathy: Principal | ICD-10-CM | POA: Insufficient documentation

## 2023-06-13 DIAGNOSIS — R636 Underweight: Secondary | ICD-10-CM | POA: Insufficient documentation

## 2023-06-13 DIAGNOSIS — R627 Adult failure to thrive: Secondary | ICD-10-CM | POA: Diagnosis not present

## 2023-06-13 DIAGNOSIS — Z85828 Personal history of other malignant neoplasm of skin: Secondary | ICD-10-CM | POA: Insufficient documentation

## 2023-06-13 DIAGNOSIS — Z853 Personal history of malignant neoplasm of breast: Secondary | ICD-10-CM | POA: Diagnosis not present

## 2023-06-13 DIAGNOSIS — G4752 REM sleep behavior disorder: Secondary | ICD-10-CM | POA: Insufficient documentation

## 2023-06-13 DIAGNOSIS — G20C Parkinsonism, unspecified: Secondary | ICD-10-CM | POA: Insufficient documentation

## 2023-06-13 DIAGNOSIS — E538 Deficiency of other specified B group vitamins: Secondary | ICD-10-CM | POA: Insufficient documentation

## 2023-06-13 DIAGNOSIS — R443 Hallucinations, unspecified: Secondary | ICD-10-CM | POA: Insufficient documentation

## 2023-06-13 DIAGNOSIS — R918 Other nonspecific abnormal finding of lung field: Secondary | ICD-10-CM | POA: Diagnosis not present

## 2023-06-13 DIAGNOSIS — D7589 Other specified diseases of blood and blood-forming organs: Secondary | ICD-10-CM

## 2023-06-13 DIAGNOSIS — G9340 Encephalopathy, unspecified: Secondary | ICD-10-CM | POA: Diagnosis not present

## 2023-06-13 DIAGNOSIS — G20A1 Parkinson's disease without dyskinesia, without mention of fluctuations: Secondary | ICD-10-CM | POA: Diagnosis not present

## 2023-06-13 DIAGNOSIS — R9431 Abnormal electrocardiogram [ECG] [EKG]: Secondary | ICD-10-CM | POA: Diagnosis not present

## 2023-06-13 DIAGNOSIS — R4182 Altered mental status, unspecified: Secondary | ICD-10-CM | POA: Diagnosis not present

## 2023-06-13 DIAGNOSIS — C50919 Malignant neoplasm of unspecified site of unspecified female breast: Secondary | ICD-10-CM | POA: Diagnosis present

## 2023-06-13 LAB — URINALYSIS, ROUTINE W REFLEX MICROSCOPIC
Bacteria, UA: NONE SEEN
Bilirubin Urine: NEGATIVE
Glucose, UA: NEGATIVE mg/dL
Hgb urine dipstick: NEGATIVE
Ketones, ur: NEGATIVE mg/dL
Leukocytes,Ua: NEGATIVE
Nitrite: NEGATIVE
Protein, ur: NEGATIVE mg/dL
Specific Gravity, Urine: 1.009 (ref 1.005–1.030)
pH: 7 (ref 5.0–8.0)

## 2023-06-13 LAB — BASIC METABOLIC PANEL WITH GFR
Anion gap: 10 (ref 5–15)
BUN: 16 mg/dL (ref 8–23)
CO2: 28 mmol/L (ref 22–32)
Calcium: 10 mg/dL (ref 8.9–10.3)
Chloride: 105 mmol/L (ref 98–111)
Creatinine, Ser: 0.78 mg/dL (ref 0.44–1.00)
GFR, Estimated: >60 mL/min (ref >60)
Glucose, Bld: 132 mg/dL — ABNORMAL HIGH (ref 70–99)
Potassium: 4.3 mmol/L (ref 3.5–5.1)
Sodium: 143 mmol/L (ref 135–145)

## 2023-06-13 LAB — CBC WITH DIFFERENTIAL/PLATELET
Abs Immature Granulocytes: 0.01 10*3/uL (ref 0.00–0.07)
Basophils Absolute: 0 10*3/uL (ref 0.0–0.1)
Basophils Relative: 1 %
Eosinophils Absolute: 0.2 10*3/uL (ref 0.0–0.5)
Eosinophils Relative: 6 %
HCT: 38.8 % (ref 36.0–46.0)
Hemoglobin: 12.6 g/dL (ref 12.0–15.0)
Immature Granulocytes: 0 %
Lymphocytes Relative: 33 %
Lymphs Abs: 1.3 10*3/uL (ref 0.7–4.0)
MCH: 33.3 pg (ref 26.0–34.0)
MCHC: 32.5 g/dL (ref 30.0–36.0)
MCV: 102.6 fL — ABNORMAL HIGH (ref 80.0–100.0)
Monocytes Absolute: 0.2 10*3/uL (ref 0.1–1.0)
Monocytes Relative: 5 %
Neutro Abs: 2.2 10*3/uL (ref 1.7–7.7)
Neutrophils Relative %: 55 %
Platelets: 229 10*3/uL (ref 150–400)
RBC: 3.78 MIL/uL — ABNORMAL LOW (ref 3.87–5.11)
RDW: 13.5 % (ref 11.5–15.5)
WBC: 4 10*3/uL (ref 4.0–10.5)
nRBC: 0 % (ref 0.0–0.2)

## 2023-06-13 LAB — CBC
HCT: 37.8 % (ref 36.0–46.0)
Hemoglobin: 12.4 g/dL (ref 12.0–15.0)
MCH: 33.7 pg (ref 26.0–34.0)
MCHC: 32.8 g/dL (ref 30.0–36.0)
MCV: 102.7 fL — ABNORMAL HIGH (ref 80.0–100.0)
Platelets: 200 10*3/uL (ref 150–400)
RBC: 3.68 MIL/uL — ABNORMAL LOW (ref 3.87–5.11)
RDW: 13.5 % (ref 11.5–15.5)
WBC: 5 10*3/uL (ref 4.0–10.5)
nRBC: 0 % (ref 0.0–0.2)

## 2023-06-13 LAB — CREATININE, SERUM
Creatinine, Ser: 0.54 mg/dL (ref 0.44–1.00)
GFR, Estimated: >60 mL/min (ref >60)

## 2023-06-13 LAB — AMMONIA: Ammonia: <13 umol/L (ref 9–35)

## 2023-06-13 LAB — TSH: TSH: 5.569 u[IU]/mL — ABNORMAL HIGH (ref 0.350–4.500)

## 2023-06-13 LAB — VITAMIN B12: Vitamin B-12: 392 pg/mL (ref 180–914)

## 2023-06-13 LAB — FOLATE: Folate: 13.6 ng/mL (ref >5.9)

## 2023-06-13 MED ORDER — ENOXAPARIN SODIUM 30 MG/0.3ML IJ SOSY
30.0000 mg | PREFILLED_SYRINGE | Freq: Every day | INTRAMUSCULAR | Status: DC
  Administered 2023-06-14 – 2023-06-23 (×10): 30 mg via SUBCUTANEOUS
  Filled 2023-06-13 (×10): qty 0.3

## 2023-06-13 MED ORDER — VENLAFAXINE HCL ER 75 MG PO CP24
112.5000 mg | ORAL_CAPSULE | Freq: Every day | ORAL | Status: DC
  Administered 2023-06-14 – 2023-06-23 (×10): 112.5 mg via ORAL
  Filled 2023-06-13 (×10): qty 1

## 2023-06-13 MED ORDER — PIMAVANSERIN TARTRATE 34 MG PO CAPS
34.0000 mg | ORAL_CAPSULE | Freq: Every day | ORAL | Status: DC
  Administered 2023-06-14 – 2023-06-19 (×7): 34 mg via ORAL
  Filled 2023-06-13 (×8): qty 1

## 2023-06-13 MED ORDER — VENLAFAXINE HCL ER 75 MG PO CP24: 75.0000 mg | ORAL_CAPSULE | Freq: Every day | ORAL | Status: DC

## 2023-06-13 MED ORDER — VENLAFAXINE HCL ER 37.5 MG PO CP24: 37.5000 mg | ORAL_CAPSULE | Freq: Every day | ORAL | Status: DC

## 2023-06-13 MED ORDER — PANTOPRAZOLE SODIUM 40 MG PO TBEC
40.0000 mg | DELAYED_RELEASE_TABLET | Freq: Every day | ORAL | Status: DC
  Administered 2023-06-14 – 2023-06-23 (×10): 40 mg via ORAL
  Filled 2023-06-13 (×10): qty 1

## 2023-06-13 MED ORDER — CLONAZEPAM 0.25 MG PO TBDP
0.2500 mg | ORAL_TABLET | Freq: Every day | ORAL | Status: DC
  Administered 2023-06-14 – 2023-06-22 (×10): 0.25 mg via ORAL
  Filled 2023-06-13 (×10): qty 1

## 2023-06-13 NOTE — ED Provider Notes (Signed)
 [place] EMERGENCY DEPARTMENT AT [facility] Provider Note   CSN: [id number] Arrival date & time: 06/13/23  0846     History  Chief Complaint  Patient presents with   Altered Mental Status    [name] is a 83 y.o. female.  83 year old female with past medical history of Parkinson's disease and depression presenting to the emergency department today with concern for worsening mental status.  The patient was seen here around a week ago and was evaluated.  Followed up with her neurologist and had some medications adjusted.  She has apparently been having worsening confusion and altered mental status despite this.  Her son who is here with her states that she was living alone until her ER visit a week ago.  Since then she has been having worsening confusion.  Her son states that she will have visual hallucinations and has been confused and that she was not sure which room she was supposed to sleep and despite being there for over a week.  She did have a fall on Tuesday when she went to use the bathroom and did hit her head.  She did not lose consciousness.   Altered Mental Status      Home Medications Prior to Admission medications   Medication Sig Start Date End Date Taking? Authorizing Provider  Carbidopa -Levodopa  ER (RYTARY ) 36.25-145 MG CPCR 1 capsule at 7 AM/11 AM/3 PM/7 PM 06/10/23  Yes Tat, Von Grumbling, DO  celecoxib  (CELEBREX ) 200 MG capsule Take 1 capsule (200 mg total) by mouth every morning. 05/01/23  Yes Donnie Galea, MD  clonazePAM  (KLONOPIN ) 0.5 MG tablet TAKE 1/2 TABLET BY MOUTH AT BEDTIME 02/14/23  Yes Tat, Von Grumbling, DO  omeprazole  (PRILOSEC ) 20 MG capsule TAKE ONE CAPSULE BY MOUTH ONCE DAILY 02/14/23  Yes Donnie Galea, MD  Pimavanserin  Tartrate (NUPLAZID ) 34 MG CAPS Take 1 capsule (34 mg total) by mouth daily. 06/10/23  Yes Tat, Von Grumbling, DO  venlafaxine  XR (EFFEXOR -XR) 37.5 MG 24 hr capsule TAKE ONE CAPSULE BY MOUTH EVERY MORNING with breakfast  04/29/23  Yes Donnie Galea, MD  venlafaxine  XR (EFFEXOR -XR) 75 MG 24 hr capsule TAKE ONE CAPSULE BY MOUTH EVERY DAY 04/21/23  Yes Donnie Galea, MD  Calcium 1500 MG tablet Take 1,500 mg by mouth.    [provider]  Denosumab  (PROLIA  [place]) Inject into the skin every 6 (six) months.    [provider]  Elastic Bandages & Supports (ABDOMINAL BINDER/ELASTIC SMALL) MISC 1 Device by Does not apply route daily. 10/15/15   Tat, Von Grumbling, DO  methocarbamol  (ROBAXIN ) 500 MG tablet TAKE ONE TABLET BY MOUTH EVERY 8 HOURS AS NEEDED FOR MUSCLE SPASMS (SEDATION CAUTION) 05/25/23   Donnie Galea, MD  Multiple Vitamin (MULTIVITAMIN) capsule 2 tabs po qd    [provider]  OnabotulinumtoxinA (BOTOX IM) Inject into the muscle every 3 (three) months. Injection every 3 months    [provider]      Allergies    Penicillins    Review of Systems   Review of Systems  Reason unable to perform ROS: Altered mental status.    Physical Exam Updated Vital Signs BP (!) 116/96 (BP Location: Left Arm)   Pulse 72   Temp 98.9 F (37.2 C)   Resp 18   SpO2 100%  Physical Exam Vitals and nursing note reviewed.   Gen: NAD Eyes: PERRL, EOMI HEENT: no oropharyngeal swelling Neck: trachea midline, no midline tenderness, no meningismus Resp:  clear to auscultation bilaterally Card: RRR, no murmurs, rubs, or gallops Abd: nontender, nondistended Extremities: no calf tenderness, no edema Vascular: 2+ radial pulses bilaterally, 2+ DP pulses bilaterally Alert and oriented to person and place and does know that is 2025 but unsure of the month, no obvious focal deficits on exam Skin: no rashes Psyc: acting appropriately   ED Results / Procedures / Treatments   Labs (all labs ordered are listed, but only abnormal results are displayed) Labs Reviewed  CBC WITH DIFFERENTIAL/PLATELET - Abnormal; Notable for the following components:      Result Value   RBC 3.78 (*)    MCV 102.6  (*)    All other components within normal limits  BASIC METABOLIC PANEL WITH GFR - Abnormal; Notable for the following components:   Glucose, Bld 132 (*)    All other components within normal limits  CBC - Abnormal; Notable for the following components:   RBC 3.68 (*)    MCV 102.7 (*)    All other components within normal limits  COMPREHENSIVE METABOLIC PANEL WITH GFR - Abnormal; Notable for the following components:   Albumin 3.4 (*)    All other components within normal limits  CBC - Abnormal; Notable for the following components:   MCV 102.2 (*)    All other components within normal limits  TSH - Abnormal; Notable for the following components:   TSH 5.569 (*)    All other components within normal limits  URINALYSIS, ROUTINE W REFLEX MICROSCOPIC  AMMONIA  CREATININE, SERUM  VITAMIN B12  FOLATE  RPR  MISC [facility] TEST (SEND OUT)    EKG EKG Interpretation Date/Time:  Monday Jun 13 2023 09:00:14 EDT Ventricular Rate:  79 PR Interval:  104 QRS Duration:  94 QT Interval:  388 QTC Calculation: 445 R Axis:   75  Text Interpretation: Sinus rhythm Short PR interval Borderline T wave abnormalities Baseline wander in lead(s) V3 Confirmed by Abner Hoffman [phone]) on 06/13/2023 9:06:25 AM  Radiology DG Chest Portable 1 View Result Date: 06/13/2023 CLINICAL DATA:  Altered mental status.  Increased confusion. EXAM: PORTABLE CHEST 1 VIEW COMPARISON:  06/04/2023. FINDINGS: Bilateral lungs appear hyperlucent with coarse bronchovascular markings, concerning for underlying COPD. There is a linear opacity overlying the lateral aspect of the left mid lower lung zones, likely external object/skin fold. Bilateral lungs otherwise appear clear. No dense consolidation or lung collapse. Bilateral costophrenic angles are clear. Stable cardio-mediastinal silhouette. No acute osseous abnormalities. Surgical staples noted overlying the right lower hemithorax. The soft tissues are within normal limits.  IMPRESSION: *No active disease. Probable COPD. Electronically Signed   By: Beula Brunswick M.D.   On: 06/13/2023 09:49   CT Head Wo Contrast Result Date: 06/13/2023 CLINICAL DATA:  83 year old female with altered mental status and increased confusion. EXAM: CT HEAD WITHOUT CONTRAST TECHNIQUE: Contiguous axial images were obtained from the base of the skull through the vertex without intravenous contrast. RADIATION DOSE REDUCTION: This exam was performed according to the departmental dose-optimization program which includes automated exposure control, adjustment of the mA and/or kV according to patient size and/or use of iterative reconstruction technique. COMPARISON:  Head CT 06/04/2023. FINDINGS: Brain: Cerebral volume is stable, normal for age. No midline shift, ventriculomegaly, mass effect, evidence of mass lesion, intracranial hemorrhage or evidence of cortically based acute infarction. Gray-white differentiation is stable and normal for age. Vascular: Calcified atherosclerosis at the skull base. No suspicious intracranial vascular hyperdensity. Skull: Stable and intact. Osteopenia. Scaphocephaly, normal variant. Sinuses/Orbits: Visualized  paranasal sinuses and mastoids are stable and well aerated. Other: No acute orbit or scalp soft tissue finding. IMPRESSION: Stable and normal for age noncontrast CT appearance of the brain. Electronically Signed   By: Marlise Simpers M.D.   On: 06/13/2023 09:43    Procedures Procedures    Medications Ordered in ED Medications  Pimavanserin  Tartrate CAPS 34 mg (34 mg Oral Given 06/14/23 0012)  pantoprazole  (PROTONIX ) EC tablet 40 mg (has no administration in time range)  enoxaparin  (LOVENOX ) injection 30 mg (has no administration in time range)  clonazePAM  (KLONOPIN ) disintegrating tablet 0.25 mg (0.25 mg Oral Given 06/14/23 0013)  venlafaxine  XR (EFFEXOR -XR) 24 hr capsule 112.5 mg (has no administration in time range)  Carbidopa -Levodopa  ER 36.25-145 MG CPCR 1 capsule  (has no administration in time range)  Carbidopa -Levodopa  ER (SINEMET  CR) 25-100 MG tablet controlled release 1 tablet (1 tablet Oral Given 06/14/23 0027)    ED Course/ Medical Decision Making/ A&P                                 Medical Decision Making 83 year old female with past medical history of Parkinson's and depression presenting to the emergency department today with worsening mental status.  I will further evaluate her here with basic labs to eval for electrolyte abnormalities close a repeat chest x-ray and urinalysis to eval for infectious etiologies.  Will also obtain an ammonia level.  I will obtain a CT scan of her head given her recent fall to eval for intracranial hemorrhage or injury.  This may be due to her underlying Parkinson's.  I will reevaluate for ultimate disposition.  Workup largely unrevealing.  Case discussed with Dr. Doretta Gant from neurology.  Recommends admission if the patient is unable to care for self or if unsafe at home.  A call was placed to hospitalist service for admission after discussion with the patient's family about options.  Amount and/or Complexity of Data Reviewed Labs: ordered. Radiology: ordered.  Risk Decision regarding hospitalization.           Final Clinical Impression(s) / ED Diagnoses Final diagnoses:  Acute encephalopathy    Rx / DC Orders ED Discharge Orders     None         Carin Charleston, MD 06/14/23 [phone]

## 2023-06-13 NOTE — H&P (Signed)
 History and Physical    [name] JYN:[id number] DOB: [birth year] DOA: 06/13/2023  Patient coming from: Home.  Chief Complaint: Hallucination and confusion.  HPI: [name] is a 83 y.o. female with history of Parkinson's disease has been having increasing hallucination over the last 2 weeks.  Patient's son provided the history.  Patient's son states that about a week ago patient had severe hallucination and was taken to the ER and at the ER CT head was unremarkable and most of the lab works were unremarkable and was advised to follow-up with neurologist.  Patient follows with her neurologist and patient's amantadine  was discontinued and started on Nuplazid .  So far patient has taken 2 dose of Nuplazid .  As per the son patient is becoming increasingly confused and not able to recognize patient's son.  Over the last one week patient has been staying at her son's house.  Has not had any nausea vomiting diarrhea chest pain shortness of breath fever chills productive cough.  Patient's son is not sure if she has been taking her medicines as advised previously but while in his house he has she has been taking it regularly.  Patient's significant other has been recently placed in an assisted living facility.  ED Course: In the ER patient was afebrile.  Labs show macrocytosis.  Ammonia levels were negative.  UA unremarkable chest x-ray did not show any acute changes CT head was unremarkable.  Review of Systems: As per HPI, rest all negative.   Past Medical History:  Diagnosis Date   Allergy    SEASONAL   Anemia    Anemia-NOS / PMH, Dr Marguerita Shih   Arthritis    Breast cancer [facility])    stage I right breast   Cataract    BILATERAL   Decreased vision    R eye   Esophageal Stricture    Hyperlipidemia    Monoclonal gammopathy    MVP (mitral valve prolapse)    Osteoporosis    Parkinson disease (HCC)    Skin cancer    basal cell L neck   Vertical diplopia     Past Surgical History:   Procedure Laterality Date   APPENDECTOMY  2002   BONE BIOPSY  11/06/10   BREAST LUMPECTOMY  02/2010   right breast lumpectomy and sentinel node biopsy   CARDIAC CATHETERIZATION  2005   neg   CATARACT EXTRACTION, BILATERAL  2018   COLONOSCOPY     negative    HEMORRHOID SURGERY  1970's   SALPINGOOPHORECTOMY  2002   for benign growths   SHOULDER SURGERY Right    TOTAL ABDOMINAL HYSTERECTOMY W/ BILATERAL SALPINGOOPHORECTOMY  83 yrs old   for pain,prolapse ; G3 P2   UPPER GASTROINTESTINAL ENDOSCOPY       reports that she has never smoked. She has never used smokeless tobacco. She reports that she does not currently use alcohol after a past usage of about 1.0 standard drink of alcohol per week. She reports that she does not use drugs.  Allergies  Allergen Reactions   Penicillins Shortness Of Breath and Itching    Family History  Problem Relation Age of Onset   Diabetes Mother    Heart disease Mother    Diabetes Brother    Heart disease Brother    Hepatitis Father    Diabetes Brother    Diabetes Brother    Breast cancer Neg Hx    Colon cancer Neg Hx     Prior to Admission medications  Medication Sig Start Date End Date Taking? Authorizing Provider  Carbidopa -Levodopa  ER (RYTARY ) 36.25-145 MG CPCR 1 capsule at 7 AM/11 AM/3 PM/7 PM 06/10/23  Yes Tat, Von Grumbling, DO  celecoxib  (CELEBREX ) 200 MG capsule Take 1 capsule (200 mg total) by mouth every morning. 05/01/23  Yes Donnie Galea, MD  clonazePAM  (KLONOPIN ) 0.5 MG tablet TAKE 1/2 TABLET BY MOUTH AT BEDTIME 02/14/23  Yes Tat, Von Grumbling, DO  omeprazole  (PRILOSEC ) 20 MG capsule TAKE ONE CAPSULE BY MOUTH ONCE DAILY 02/14/23  Yes Donnie Galea, MD  Pimavanserin  Tartrate (NUPLAZID ) 34 MG CAPS Take 1 capsule (34 mg total) by mouth daily. 06/10/23  Yes Tat, Von Grumbling, DO  venlafaxine  XR (EFFEXOR -XR) 37.5 MG 24 hr capsule TAKE ONE CAPSULE BY MOUTH EVERY MORNING with breakfast 04/29/23  Yes Donnie Galea, MD  venlafaxine  XR  (EFFEXOR -XR) 75 MG 24 hr capsule TAKE ONE CAPSULE BY MOUTH EVERY DAY 04/21/23  Yes Donnie Galea, MD  Calcium 1500 MG tablet Take 1,500 mg by mouth.    [provider]  Denosumab  (PROLIA  [place]) Inject into the skin every 6 (six) months.    [provider]  Elastic Bandages & Supports (ABDOMINAL BINDER/ELASTIC SMALL) MISC 1 Device by Does not apply route daily. 10/15/15   Tat, Von Grumbling, DO  methocarbamol  (ROBAXIN ) 500 MG tablet TAKE ONE TABLET BY MOUTH EVERY 8 HOURS AS NEEDED FOR MUSCLE SPASMS (SEDATION CAUTION) 05/25/23   Donnie Galea, MD  Multiple Vitamin (MULTIVITAMIN) capsule 2 tabs po qd    [provider]  OnabotulinumtoxinA (BOTOX IM) Inject into the muscle every 3 (three) months. Injection every 3 months    [provider]    Physical Exam: Constitutional: Moderately built and nourished. Vitals:   06/13/23 1237 06/13/23 1647 06/13/23 1750 06/13/23 1849  BP: (!) 158/90  139/70 (!) 148/82  Pulse: 76  76 83  Resp: 19  20   Temp: (!) 97.5 F (36.4 C) 97.6 F (36.4 C)    TempSrc: Oral     SpO2: 100%  100% 100%   Eyes: Anicteric no pallor. ENMT: No discharge from the ears/nose or mouth. Neck: No mass felt.  No neck rigidity. Respiratory: No rhonchi or crepitations. Cardiovascular: S1-S2 heard. Abdomen: Soft nontender bowel sound present. Musculoskeletal: No edema. Skin: No rash. Neurologic: Alert awake oriented time place and person.  Moves all extremities.  Tremors. Psychiatric: Oriented to time place and person.   Labs on Admission: I have personally reviewed following labs and imaging studies  CBC: Recent Labs  Lab 06/13/23 0909 06/13/23 2220  WBC 4.0 5.0  NEUTROABS 2.2  --   HGB 12.6 12.4  HCT 38.8 37.8  MCV 102.6* 102.7*  PLT 229 200   Basic Metabolic Panel: Recent Labs  Lab 06/13/23 0909 06/13/23 2220  NA 143  --   K 4.3  --   CL 105  --   CO2 28  --   GLUCOSE 132*  --   BUN 16  --   CREATININE 0.78 0.54  CALCIUM  10.0  --    GFR: Estimated Creatinine Clearance: 38.3 mL/min (by C-G formula based on SCr of 0.54 mg/dL). Liver Function Tests: No results for input(s): "AST", "ALT", "ALKPHOS", "BILITOT", "PROT", "ALBUMIN" in the last 168 hours. No results for input(s): "LIPASE", "AMYLASE" in the last 168 hours. Recent Labs  Lab 06/13/23 0909  AMMONIA <13   Coagulation Profile: No results for input(s): "INR", "PROTIME" in the last 168 hours. Cardiac Enzymes:  No results for input(s): "CKTOTAL", "CKMB", "CKMBINDEX", "TROPONINI" in the last 168 hours. BNP (last 3 results) No results for input(s): "PROBNP" in the last 8760 hours. HbA1C: No results for input(s): "HGBA1C" in the last 72 hours. CBG: No results for input(s): "GLUCAP" in the last 168 hours. Lipid Profile: No results for input(s): "CHOL", "HDL", "LDLCALC", "TRIG", "CHOLHDL", "LDLDIRECT" in the last 72 hours. Thyroid  Function Tests: No results for input(s): "TSH", "T4TOTAL", "FREET4", "T3FREE", "THYROIDAB" in the last 72 hours. Anemia Panel: No results for input(s): "VITAMINB12", "FOLATE", "FERRITIN", "TIBC", "IRON", "RETICCTPCT" in the last 72 hours. Urine analysis:    Component Value Date/Time   COLORURINE YELLOW 06/13/2023 1231   APPEARANCEUR CLEAR 06/13/2023 1231   LABSPEC 1.009 06/13/2023 1231   PHURINE 7.0 06/13/2023 1231   GLUCOSEU NEGATIVE 06/13/2023 1231   HGBUR NEGATIVE 06/13/2023 1231   HGBUR moderate 01/10/2007 1547   BILIRUBINUR NEGATIVE 06/13/2023 1231   BILIRUBINUR Neg 01/12/2018 1228   KETONESUR NEGATIVE 06/13/2023 1231   PROTEINUR NEGATIVE 06/13/2023 1231   UROBILINOGEN 0.2 01/12/2018 1228   UROBILINOGEN negative 01/10/2007 1547   NITRITE NEGATIVE 06/13/2023 1231   LEUKOCYTESUR NEGATIVE 06/13/2023 1231   Sepsis Labs: @LABRCNTIP (procalcitonin:4,lacticidven:4) )No results found for this or any previous visit (from the past 240 hours).   Radiological Exams on Admission: DG Chest Portable 1 View Result Date:  06/13/2023 CLINICAL DATA:  Altered mental status.  Increased confusion. EXAM: PORTABLE CHEST 1 VIEW COMPARISON:  06/04/2023. FINDINGS: Bilateral lungs appear hyperlucent with coarse bronchovascular markings, concerning for underlying COPD. There is a linear opacity overlying the lateral aspect of the left mid lower lung zones, likely external object/skin fold. Bilateral lungs otherwise appear clear. No dense consolidation or lung collapse. Bilateral costophrenic angles are clear. Stable cardio-mediastinal silhouette. No acute osseous abnormalities. Surgical staples noted overlying the right lower hemithorax. The soft tissues are within normal limits. IMPRESSION: *No active disease. Probable COPD. Electronically Signed   By: Beula Brunswick M.D.   On: 06/13/2023 09:49   CT Head Wo Contrast Result Date: 06/13/2023 CLINICAL DATA:  83 year old female with altered mental status and increased confusion. EXAM: CT HEAD WITHOUT CONTRAST TECHNIQUE: Contiguous axial images were obtained from the base of the skull through the vertex without intravenous contrast. RADIATION DOSE REDUCTION: This exam was performed according to the departmental dose-optimization program which includes automated exposure control, adjustment of the mA and/or kV according to patient size and/or use of iterative reconstruction technique. COMPARISON:  Head CT 06/04/2023. FINDINGS: Brain: Cerebral volume is stable, normal for age. No midline shift, ventriculomegaly, mass effect, evidence of mass lesion, intracranial hemorrhage or evidence of cortically based acute infarction. Gray-white differentiation is stable and normal for age. Vascular: Calcified atherosclerosis at the skull base. No suspicious intracranial vascular hyperdensity. Skull: Stable and intact. Osteopenia. Scaphocephaly, normal variant. Sinuses/Orbits: Visualized paranasal sinuses and mastoids are stable and well aerated. Other: No acute orbit or scalp soft tissue finding. IMPRESSION:  Stable and normal for age noncontrast CT appearance of the brain. Electronically Signed   By: Marlise Simpers M.D.   On: 06/13/2023 09:43    EKG: Independently reviewed.  Normal sinus rhythm.  Assessment/Plan Principal Problem:   Acute encephalopathy Active Problems:   Breast cancer (HCC)   Parkinson's disease (HCC)   Hallucinations   RBD (REM behavioral disorder)   Depression   Macrocytosis    Acute encephalopathy/hallucination -    patient was recently started on Nuplazid  for hallucination in the setting of Parkinson's disease.  Will consult neurology. Parkinson's disease  on Rytary  145 mg.  Discussed with neurology about dosing. RBD on Klonopin . Depression on Effexor . Sialorrhea on Xeomin  injections. Macrocytosis will check B12 and folate levels.  Since patient has ongoing hallucination will need close monitoring further workup and more than 2 midnight stay.   DVT prophylaxis: Lovenox . Code Status: DNR confirmed with patient's son. Family Communication: Patient's son Mr. [name]. Disposition Plan: Medical floor. Consults called: Will consult neurology. Admission status: Observation.

## 2023-06-13 NOTE — Progress Notes (Signed)
 Patient arrived via [facility]. Alert and oriented x 3-4. VSS. Tele applied.

## 2023-06-13 NOTE — ED Triage Notes (Signed)
 Son states increased confusion and AMS x 2 week. States was seen here on 5/10 for same.  Son reports fall on Tuesday night. Unsure if hit head.

## 2023-06-13 NOTE — ED Notes (Signed)
 [name] with cl called for transport

## 2023-06-13 NOTE — ED Notes (Signed)
 Fall risk bracelet/sinage and warm blanket provided.

## 2023-06-13 NOTE — Plan of Care (Signed)
 Transfer from [facility]  Ms. [name] is a 83 y/o female with pmh Parkinson disease who presents for altered mental status with increasing confusion over the last 2 weeks and previous fall.  Recently started on carbidopa -levodopa  and being followed by neurology.  At baseline and lives alone, but currently appears unable to care for herself.  Labs are relatively unremarkable including ammonia level.  CT imaging of the head noted no acute abnormality and stable from previous checked on 5/10.  Chest x-ray showed no acute abnormality. Accepted as observation to a medical telemetry bed.

## 2023-06-14 ENCOUNTER — Inpatient Hospital Stay: Admitting: Family Medicine

## 2023-06-14 DIAGNOSIS — G20A1 Parkinson's disease without dyskinesia, without mention of fluctuations: Secondary | ICD-10-CM | POA: Diagnosis not present

## 2023-06-14 DIAGNOSIS — G9340 Encephalopathy, unspecified: Secondary | ICD-10-CM | POA: Diagnosis not present

## 2023-06-14 DIAGNOSIS — R443 Hallucinations, unspecified: Secondary | ICD-10-CM | POA: Diagnosis not present

## 2023-06-14 DIAGNOSIS — G9341 Metabolic encephalopathy: Secondary | ICD-10-CM | POA: Diagnosis not present

## 2023-06-14 LAB — CBC
HCT: 41.2 % (ref 36.0–46.0)
Hemoglobin: 13.5 g/dL (ref 12.0–15.0)
MCH: 33.5 pg (ref 26.0–34.0)
MCHC: 32.8 g/dL (ref 30.0–36.0)
MCV: 102.2 fL — ABNORMAL HIGH (ref 80.0–100.0)
Platelets: 221 10*3/uL (ref 150–400)
RBC: 4.03 MIL/uL (ref 3.87–5.11)
RDW: 13.5 % (ref 11.5–15.5)
WBC: 5.9 10*3/uL (ref 4.0–10.5)
nRBC: 0 % (ref 0.0–0.2)

## 2023-06-14 LAB — RPR: RPR Ser Ql: NONREACTIVE

## 2023-06-14 LAB — COMPREHENSIVE METABOLIC PANEL WITH GFR
ALT: <5 U/L (ref 0–44)
AST: 24 U/L (ref 15–41)
Albumin: 3.4 g/dL — ABNORMAL LOW (ref 3.5–5.0)
Alkaline Phosphatase: 56 U/L (ref 38–126)
Anion gap: 10 (ref 5–15)
BUN: 10 mg/dL (ref 8–23)
CO2: 24 mmol/L (ref 22–32)
Calcium: 9.1 mg/dL (ref 8.9–10.3)
Chloride: 106 mmol/L (ref 98–111)
Creatinine, Ser: 0.64 mg/dL (ref 0.44–1.00)
GFR, Estimated: >60 mL/min (ref >60)
Glucose, Bld: 96 mg/dL (ref 70–99)
Potassium: 4.1 mmol/L (ref 3.5–5.1)
Sodium: 140 mmol/L (ref 135–145)
Total Bilirubin: 0.5 mg/dL (ref 0.0–1.2)
Total Protein: 6.5 g/dL (ref 6.5–8.1)

## 2023-06-14 LAB — T4, FREE: Free T4: 0.83 ng/dL (ref 0.61–1.12)

## 2023-06-14 MED ORDER — POLYETHYLENE GLYCOL 3350 17 G PO PACK: 17.0000 g | PACK | Freq: Two times a day (BID) | ORAL | Status: DC

## 2023-06-14 MED ORDER — POLYETHYLENE GLYCOL 3350 17 G PO PACK
17.0000 g | PACK | Freq: Two times a day (BID) | ORAL | Status: AC
Start: 2023-06-14 — End: 2023-06-16
  Administered 2023-06-14 – 2023-06-15 (×2): 17 g via ORAL
  Filled 2023-06-14 (×3): qty 1

## 2023-06-14 MED ORDER — CARBIDOPA-LEVODOPA ER 25-100 MG PO TBCR
1.0000 | EXTENDED_RELEASE_TABLET | Freq: Once | ORAL | Status: AC
  Administered 2023-06-14: 1 via ORAL
  Filled 2023-06-14: qty 1

## 2023-06-14 MED ORDER — VITAMIN B-12 1000 MCG PO TABS
1000.0000 ug | ORAL_TABLET | Freq: Every day | ORAL | Status: DC
  Administered 2023-06-14 – 2023-06-23 (×10): 1000 ug via ORAL
  Filled 2023-06-14 (×10): qty 1

## 2023-06-14 MED ORDER — ASPIRIN 81 MG PO TBEC
81.0000 mg | DELAYED_RELEASE_TABLET | Freq: Every day | ORAL | Status: DC
  Administered 2023-06-14 – 2023-06-23 (×10): 81 mg via ORAL
  Filled 2023-06-14 (×10): qty 1

## 2023-06-14 MED ORDER — PHENOL 1.4 % MT LIQD
1.0000 | OROMUCOSAL | Status: DC | PRN
  Administered 2023-06-14 – 2023-06-15 (×2): 1 via OROMUCOSAL
  Filled 2023-06-14: qty 177

## 2023-06-14 MED ORDER — CARBIDOPA-LEVODOPA ER 36.25-145 MG PO CPCR
1.0000 | ORAL_CAPSULE | Freq: Four times a day (QID) | ORAL | Status: DC
  Administered 2023-06-14 – 2023-06-23 (×37): 1 via ORAL
  Filled 2023-06-14 (×9): qty 1
  Filled 2023-06-14: qty 100
  Filled 2023-06-14 (×33): qty 1

## 2023-06-14 NOTE — TOC Initial Note (Addendum)
 Transition of Care [facility]) - Initial/Assessment Note    Patient Details  Name: [name] MRN: [id number] Date of Birth: 06-08-1940  Transition of Care [facility]) CM/SW Contact:    Arron Big, LCSWA Phone Number: 06/14/2023, 11:23 AM  Clinical Narrative:   Per patients son, Rosalva Comber, patient is from home alone. Patient's family can assist with transportation at discharge if needed. Patient has a PCP and [employer]. Patient has no prior hx of HH or SNF in [place] - Rosalva Comber stated patient has had HHPT and RN before but could not remember the name of the agency. Awaiting PT/OT recs for disposition.   4:08 PM Patients son in room, CSW spoke with him about PT recs for SNF STR. He is agreeable to that plan at this time. CSW will work on SNF workup.  4:19 PM PASRR level 2 at this time. Will need to updload cosigned FL2, 30 day note, and additional clinicals.  TOC will continue to follow.    Expected Discharge Plan:  (TBD) Barriers to Discharge: Continued Medical Work up   Patient Goals and CMS Choice Patient states their goals for this hospitalization and ongoing recovery are:: To return home          Expected Discharge Plan and Services       Living arrangements for the past 2 months: Single Family Home                                      Prior Living Arrangements/Services Living arrangements for the past 2 months: Single Family Home Lives with:: Self Patient language and need for interpreter reviewed:: No Do you feel safe going back to the place where you live?: Yes      Need for Family Participation in Patient Care: Yes (Comment) Care giver support system in place?: Yes (comment) Current home services: DME (Walker, bedside rails, shower chair, cane) Criminal Activity/Legal Involvement Pertinent to Current Situation/Hospitalization: No - Comment as needed  Activities of Daily Living   ADL Screening (condition at time of admission) Independently  performs ADLs?: Yes (appropriate for developmental age) Is the patient deaf or have difficulty hearing?: No Does the patient have difficulty seeing, even when wearing glasses/contacts?: No Does the patient have difficulty concentrating, remembering, or making decisions?: No  Permission Sought/Granted Permission sought to share information with : Family Supports Permission granted to share information with : No (Family contact in patients chart)  Share Information with NAME: Rosalva Comber     Permission granted to share info w Relationship: Son  Permission granted to share info w Contact Information: [phone]  Emotional Assessment Appearance:: Appears stated age Attitude/Demeanor/Rapport: Unable to Assess Affect (typically observed): Stable Orientation: : Oriented to Self, Oriented to Place, Oriented to  Time, Oriented to Situation Alcohol / Substance Use: Not Applicable Psych Involvement: No (comment)  Admission diagnosis:  Acute encephalopathy [G93.40] Hallucinations [R44.3] Patient Active Problem List   Diagnosis Date Noted   Acute encephalopathy 06/13/2023   Hallucinations 06/13/2023   RBD (REM behavioral disorder) 06/13/2023   Depression 06/13/2023   Macrocytosis 06/13/2023   Black stools 02/27/2020   Healthcare maintenance 02/05/2019   Mitral valve prolapse 12/14/2018   Pain in right knee 11/28/2018   Livedo reticularis 09/28/2018   Abdominal pain 02/01/2018   Low back pain 12/29/2017   Edema 11/13/2017   Right arm pain 02/03/2017   Recurrent falls 02/03/2017   Advance care  planning 01/23/2017   Hematuria 07/20/2016   Headache 05/11/2015   Globus sensation 04/24/2015   Shoulder pain 04/17/2014   Parkinson's disease (HCC) 06/05/2013   Medicare annual wellness visit, subsequent 05/02/2012   Neck pain 05/02/2012   Osteoporosis 03/08/2011   Breast cancer (HCC) 11/19/2010   GERD (gastroesophageal reflux disease) 08/01/2008   DYSPHAGIA UNSPECIFIED 08/01/2008    ORTHOSTATIC HYPOTENSION 06/28/2008   HLD (hyperlipidemia) 07/10/2007   MONOCLONAL GAMMOPATHY 07/10/2007   ANEMIA-NOS 07/10/2007   FATIGUE 07/10/2007   PCP:  Donnie Galea, MD Pharmacy:   [facility] - [place], [place] - [address] [place] [address] Phone: [phone] Fax: [phone]  [facility] [phone] [place], [place] - [address] AT [facility] Sofie Dunning [address] [place] [address] Phone: [phone] Fax: [phone]  [facility] [address] [place] [address] Phone: [phone] Fax: [phone]     Social Drivers of Health (SDOH) Social History: SDOH Screenings   Food Insecurity: No Food Insecurity (06/14/2023)  Housing: Low Risk  (06/14/2023)  Transportation Needs: No Transportation Needs (06/14/2023)  Utilities: Not At Risk (06/14/2023)  Alcohol Screen: Low Risk  (11/22/2022)  Depression (PHQ2-9): Medium Risk (11/29/2022)  Financial Resource Strain: Low Risk  (11/22/2022)  Physical Activity: Inactive (11/22/2022)  Social Connections: Socially Isolated (06/14/2023)  Stress: No Stress Concern Present (11/22/2022)  Tobacco Use: Low Risk  (06/13/2023)  Health Literacy: Adequate Health Literacy (11/22/2022)   SDOH Interventions:     Readmission Risk Interventions     No data to display

## 2023-06-14 NOTE — Evaluation (Signed)
 Occupational Therapy Evaluation Patient Details Name: [name] MRN: [id number] DOB: Jun 01, 1940 Today's Date: 06/14/2023   History of Present Illness   Pt is 83 yo female who presents on 06/13/23 with increased hallucinations over 2 weeks per son. PMH: Parkinsons, breast cancer, HLD, vertical diplopia, arthritis, monoclonal gammopathy     Clinical Impressions Pt lives with son, reports ind with ADLs/mobility up until 1 week ago, and has needed progressive assist. Pt's son/DIL are typically at work 12+ hours per day. Pt currently needing CGA- min A for ADLs, CGA for transfers with RW. Pt with posterior lean and needs cues to "back up/reach back" for chair/BSC as pt sitting very close to the edge. Pt presenting with impairments listed below, will follow acutely. Patient will benefit from continued inpatient follow up therapy, <3 hours/day, unless 24/7 support can be arranged at home.      If plan is discharge home, recommend the following:   A little help with walking and/or transfers;A lot of help with bathing/dressing/bathroom;Assistance with cooking/housework;Direct supervision/assist for medications management;Direct supervision/assist for financial management;Assist for transportation;Help with stairs or ramp for entrance;Supervision due to cognitive status     Functional Status Assessment   Patient has had a recent decline in their functional status and demonstrates the ability to make significant improvements in function in a reasonable and predictable amount of time.     Equipment Recommendations   Other (comment) (defer)     Recommendations for Other Services   PT consult     Precautions/Restrictions   Precautions Precautions: Fall Recall of Precautions/Restrictions: Impaired Restrictions Weight Bearing Restrictions Per Provider Order: No     Mobility Bed Mobility               General bed mobility comments: in chair upon arrival and  departure    Transfers Overall transfer level: Needs assistance Equipment used: Rolling walker (2 wheels) Transfers: Sit to/from Stand Sit to Stand: Contact guard assist           General transfer comment: post lean      Balance Overall balance assessment: Needs assistance, History of Falls Sitting-balance support: No upper extremity supported, Feet supported Sitting balance-Leahy Scale: Good   Postural control: Posterior lean Standing balance support: Bilateral upper extremity supported, During functional activity Standing balance-Leahy Scale: Poor Standing balance comment: posterior bias , esp when not holding RW. Needs external assist in addition to RW, not safe to be up alone                           ADL either performed or assessed with clinical judgement   ADL Overall ADL's : Needs assistance/impaired Eating/Feeding: Set up;Sitting   Grooming: Wash/dry face;Set up;Standing   Upper Body Bathing: Minimal assistance;Sitting;Standing   Lower Body Bathing: Moderate assistance   Upper Body Dressing : Minimal assistance   Lower Body Dressing: Moderate assistance   Toilet Transfer: Contact guard assist;Ambulation;Regular Toilet;Rolling walker (2 wheels)   Toileting- Clothing Manipulation and Hygiene: Contact guard assist       Functional mobility during ADLs: Contact guard assist;Rolling walker (2 wheels)       Vision   Vision Assessment?: No apparent visual deficits Additional Comments: tracking WFL, R eye with medial strabismus, pt reports hallucinations     Perception Perception: Not tested       Praxis Praxis: Not tested       Pertinent Vitals/Pain Pain Assessment Pain Assessment: No/denies pain  Extremity/Trunk Assessment Upper Extremity Assessment Upper Extremity Assessment: Generalized weakness   Lower Extremity Assessment Lower Extremity Assessment: Defer to PT evaluation   Cervical / Trunk Assessment Cervical / Trunk  Assessment: Kyphotic   Communication Communication Communication: No apparent difficulties   Cognition Arousal: Alert Behavior During Therapy: Flat affect Cognition: Cognition impaired   Orientation impairments: Situation                           Following commands: Intact       Cueing  General Comments   Cueing Techniques: Verbal cues  VSS   Exercises     Shoulder Instructions      Home Living Family/patient expects to be discharged to:: Private residence Living Arrangements: Children Available Help at Discharge: Family;Available PRN/intermittently Type of Home: House Home Access: Stairs to enter [employer] of Steps: 5 Entrance Stairs-Rails: Right;Left;Can reach both Home Layout: One level     Bathroom Shower/Tub: Chief Strategy Officer: Standard Bathroom Accessibility: Yes   Home Equipment: Agricultural consultant (2 wheels);Cane - single point;BSC/3in1;Grab bars - toilet;Grab bars - tub/shower (bed rails)   Additional Comments: family is working 5am-7pm during the day, spouse just moved to ALF, shower chair can be brought to son's home      Prior Functioning/Environment               Mobility Comments: needs reminding to use RW. Was independent until a few weeks ago when cognition declined so son brought her from her home alone to his home ADLs Comments: ind prior to this past week, assist for getting in/out of shower, does light meal prep, family has assisted with medications this past week    OT Problem List: Decreased strength;Decreased range of motion;Decreased activity tolerance;Impaired balance (sitting and/or standing);Decreased cognition;Decreased safety awareness;Cardiopulmonary status limiting activity   OT Treatment/Interventions: Self-care/ADL training;Therapeutic exercise;Energy conservation;DME and/or AE instruction;Therapeutic activities;Patient/family education;Balance training;Visual/perceptual  remediation/compensation;Cognitive remediation/compensation      OT Goals(Current goals can be found in the care plan section)   Acute Rehab OT Goals Patient Stated Goal: none stated OT Goal Formulation: With patient Time For Goal Achievement: 06/28/23 Potential to Achieve Goals: Good ADL Goals Pt Will Perform Upper Body Dressing: with modified independence;sitting Pt Will Perform Lower Body Dressing: with modified independence;sitting/lateral leans;sit to/from stand Pt Will Transfer to Toilet: with modified independence;ambulating;regular height toilet Pt Will Perform Tub/Shower Transfer: Tub transfer;Shower transfer;with modified independence;ambulating Additional ADL Goal #1: pt will perform 3 step task with min cues in prep for ADLs   OT Frequency:  Min 2X/week    Co-evaluation              AM-PAC OT "6 Clicks" Daily Activity     Outcome Measure Help from another person eating meals?: A Little Help from another person taking care of personal grooming?: A Little Help from another person toileting, which includes using toliet, bedpan, or urinal?: A Little Help from another person bathing (including washing, rinsing, drying)?: A Lot Help from another person to put on and taking off regular upper body clothing?: A Little Help from another person to put on and taking off regular lower body clothing?: A Lot 6 Click Score: 16   End of Session Equipment Utilized During Treatment: Gait belt;Rolling walker (2 wheels) Nurse Communication: Mobility status  Activity Tolerance: Patient tolerated treatment well Patient left: in chair;with call bell/phone within reach;with chair alarm set;with family/visitor present  OT Visit Diagnosis: Muscle weakness (generalized) (  M62.81);Other symptoms and signs involving cognitive function;History of falling (Z91.81);Other abnormalities of gait and mobility (R26.89);Unsteadiness on feet (R26.81)                Time: 4034-7425 OT Time  Calculation (min): 34 min Charges:  OT General Charges $OT Visit: 1 Visit OT Evaluation $OT Eval Moderate Complexity: 1 Mod OT Treatments $Self Care/Home Management : 8-22 mins  Gerell Fortson K, OTD, OTR/L SecureChat Preferred Acute Rehab [phone]   Antionette Kirks 06/14/2023, 4:39 PM

## 2023-06-14 NOTE — [place] FL2 (Signed)
   MEDICAID FL2 LEVEL OF CARE FORM     IDENTIFICATION  Patient Name: [name] Birthdate: 1940-09-02 Sex: female Admission Date (Current Location): 06/13/2023  [facility] and [place] Number:  Producer, television/film/video and Address:  [facility], [address] [place], [place] [address]      Provider Number: [id number]  Attending Physician Name and Address:  Oral Billings, MD  Relative Name and Phone Number:       Current Level of Care: Hospital Recommended Level of Care: Skilled Nursing Facility Prior Approval Number:    Date Approved/Denied:   PASRR Number: Pending  Discharge Plan: SNF    Current Diagnoses: Patient Active Problem List   Diagnosis Date Noted   Acute encephalopathy 06/13/2023   Hallucinations 06/13/2023   RBD (REM behavioral disorder) 06/13/2023   Depression 06/13/2023   Macrocytosis 06/13/2023   Black stools 02/27/2020   Healthcare maintenance 02/05/2019   Mitral valve prolapse 12/14/2018   Pain in right knee 11/28/2018   Livedo reticularis 09/28/2018   Abdominal pain 02/01/2018   Low back pain 12/29/2017   Edema 11/13/2017   Right arm pain 02/03/2017   Recurrent falls 02/03/2017   Advance care planning 01/23/2017   Hematuria 07/20/2016   Headache 05/11/2015   Globus sensation 04/24/2015   Shoulder pain 04/17/2014   Parkinson's disease (HCC) 06/05/2013   Medicare annual wellness visit, subsequent 05/02/2012   Neck pain 05/02/2012   Osteoporosis 03/08/2011   Breast cancer (HCC) 11/19/2010   GERD (gastroesophageal reflux disease) 08/01/2008   DYSPHAGIA UNSPECIFIED 08/01/2008   ORTHOSTATIC HYPOTENSION 06/28/2008   HLD (hyperlipidemia) 07/10/2007   MONOCLONAL GAMMOPATHY 07/10/2007   ANEMIA-NOS 07/10/2007   FATIGUE 07/10/2007    Orientation RESPIRATION BLADDER Height & Weight     Self, Time, Situation, Place  Normal Continent Weight:   Height:     BEHAVIORAL SYMPTOMS/MOOD NEUROLOGICAL BOWEL NUTRITION  STATUS      Continent Diet (See dc summary)  AMBULATORY STATUS COMMUNICATION OF NEEDS Skin   Extensive Assist Verbally Normal                       Personal Care Assistance Level of Assistance  Bathing, Feeding, Dressing Bathing Assistance:  (See dc summary) Feeding assistance:  (See dc summary) Dressing Assistance:  (See dc summary)     Functional Limitations Info  Sight, Hearing, Speech Sight Info: Impaired (Blind/vision impaired) Hearing Info: Adequate Speech Info: Adequate    SPECIAL CARE FACTORS FREQUENCY  PT (By licensed PT), OT (By licensed OT)     PT Frequency: 5x week OT Frequency: 5x week            Contractures Contractures Info: Not present    Additional Factors Info  Code Status, Allergies, Psychotropic Code Status Info: DNR Limitede Allergies Info: Penicillins Psychotropic Info: KLONOPIN          Current Medications (06/14/2023):  This is the current hospital active medication list Current Facility-Administered Medications  Medication Dose Route Frequency Provider Last Rate Last Admin   aspirin EC tablet 81 mg  81 mg Oral Daily Oral Billings, MD   81 mg at 06/14/23 1146   Carbidopa -Levodopa  ER 36.25-145 MG CPCR 1 capsule  1 capsule Oral QID Angelene Kelly, MD   1 capsule at 06/14/23 1511   clonazePAM  (KLONOPIN ) disintegrating tablet 0.25 mg  0.25 mg Oral QHS Angelene Kelly, MD   0.25 mg at 06/14/23 0013   cyanocobalamin  (VITAMIN B12)  tablet 1,000 mcg  1,000 mcg Oral Daily Bhagat, Srishti L, MD   1,000 mcg at 06/14/23 0900   enoxaparin  (LOVENOX ) injection 30 mg  30 mg Subcutaneous Daily Kakrakandy, Arshad N, MD   30 mg at 06/14/23 0900   pantoprazole  (PROTONIX ) EC tablet 40 mg  40 mg Oral Daily Angelene Kelly, MD   40 mg at 06/14/23 0900   phenol (CHLORASEPTIC) mouth spray 1 spray  1 spray Mouth/Throat PRN Oral Billings, MD   1 spray at 06/14/23 1146   Pimavanserin  Tartrate CAPS 34 mg  34 mg Oral QHS Angelene Kelly, MD    34 mg at 06/14/23 0012   venlafaxine  XR (EFFEXOR -XR) 24 hr capsule 112.5 mg  112.5 mg Oral Q breakfast Kakrakandy, Arshad N, MD   112.5 mg at 06/14/23 0981     Discharge Medications: Please see discharge summary for a list of discharge medications.  Relevant Imaging Results:  Relevant Lab Results:   Additional Information SSN [id number], LCSWA

## 2023-06-14 NOTE — Evaluation (Addendum)
 Physical Therapy Evaluation Patient Details Name: [name] MRN: [id number] DOB: 12/09/1940 Today's Date: 06/14/2023  History of Present Illness  Pt is 83 yo female who presents on 06/13/23 with increased hallucinations over 2 weeks per son. PMH: Parkinsons, breast cancer, HLD, vertical diplopia, arthritis, monoclonal gammopathy  Clinical Impression  Pt admitted with above diagnosis. Pt has been living home alone until several weeks ago when cognition began to decline. Daughter in law present on eval and reports pt's boyfriend recently went into ALF and pt's daughter recently diagnosed with stage 4 colon cancer. Pt's son has taken pt to his house due to multiple falls so she has had much change. Family would like to avoid a facility at this point to minimize further changes. Daughter in law is willing to take some time off work while they work on finding a day program for pt. Pt with posterior bias when up and loses balance bkwds frequently. In addition, she needs reminding to use her RW. Recommend HHPT at d/c and 24/7 supervision. Pt mobilizing at min A level at this point.  Pt currently with functional limitations due to the deficits listed below (see PT Problem List). Pt will benefit from acute skilled PT to increase their independence and safety with mobility to allow discharge.    ADDENDUM: Per pt's son family cannot be with pt when she goes home to give 24/7 supervision. Recommend continued inpatient follow up therapy, <3 hours/day          If plan is discharge home, recommend the following: A little help with walking and/or transfers;A little help with bathing/dressing/bathroom;Assistance with cooking/housework;Direct supervision/assist for medications management;Direct supervision/assist for financial management;Assist for transportation;Help with stairs or ramp for entrance;Supervision due to cognitive status   Can travel by private vehicle        Equipment Recommendations None  recommended by PT  Recommendations for Other Services  OT consult    Functional Status Assessment Patient has had a recent decline in their functional status and demonstrates the ability to make significant improvements in function in a reasonable and predictable amount of time.     Precautions / Restrictions Precautions Precautions: Fall Recall of Precautions/Restrictions: Impaired Precaution/Restrictions Comments: is able to verbalize that she has fallen a lot. Dil reports that she frequently falls bkwds like a tree Restrictions Weight Bearing Restrictions Per Provider Order: No      Mobility  Bed Mobility Overal bed mobility: Needs Assistance Bed Mobility: Supine to Sit     Supine to sit: Supervision     General bed mobility comments: pt able to come to EOB without physical assist    Transfers Overall transfer level: Needs assistance Equipment used: Rolling walker (2 wheels) Transfers: Sit to/from Stand Sit to Stand: Contact guard assist           General transfer comment: from bed and toilet. Posterior bias noted, esp with stand from toilet    Ambulation/Gait Ambulation/Gait assistance: Min assist Gait Distance (Feet): 25 Feet Assistive device: Rolling walker (2 wheels) Gait Pattern/deviations: Step-through pattern, Shuffle, Trunk flexed Gait velocity: decreased Gait velocity interpretation: <1.8 ft/sec, indicate of risk for recurrent falls   General Gait Details: pt with shuffling gait, when hands are on RW pt keeps wt fwd but when she is transitioning, eg in bathroom, loses balance bkwds. Needs min A to correct  Stairs            Wheelchair Mobility     Tilt Bed    Modified Rankin (Stroke  Patients Only)       Balance Overall balance assessment: Needs assistance, History of Falls Sitting-balance support: No upper extremity supported, Feet supported Sitting balance-Leahy Scale: Good Sitting balance - Comments: maintain balance sitting EOB,  able to perform perineal care sitting on toilet without LOB Postural control: Posterior lean Standing balance support: Bilateral upper extremity supported, During functional activity Standing balance-Leahy Scale: Poor Standing balance comment: posterior bias , esp when not holding RW. Needs external assist in addition to RW, not safe to be up alone                             Pertinent Vitals/Pain Pain Assessment Pain Assessment: No/denies pain    Home Living Family/patient expects to be discharged to:: Private residence Living Arrangements: Children Available Help at Discharge: Family;Available PRN/intermittently Type of Home: House Home Access: Stairs to enter Entrance Stairs-Rails: Right;Left;Can reach both Entrance Stairs-Number of Steps: 5   Home Layout: One level Home Equipment: Agricultural consultant (2 wheels);Cane - single point;BSC/3in1;Tub bench;Grab bars - toilet;Grab bars - tub/shower (bed rail) Additional Comments: daughter in law is willing to take off work until they can get her in a day program but daughter in Social worker and son do work. Daughter going through cancer treatment right now for stage 4 colon so not likely that pt can go home with her    Prior Function Prior Level of Function : History of Falls (last six months);Patient poor historian/Family not available;Needs assist  Cognitive Assist : Mobility (cognitive);ADLs (cognitive) Mobility (Cognitive): Intermittent cues ADLs (Cognitive): Intermittent cues       Mobility Comments: needs reminding to use RW. Was independent until a few weeks ago when cognition declined so son brought her from her home alone to his home       Extremity/Trunk Assessment   Upper Extremity Assessment Upper Extremity Assessment: Generalized weakness    Lower Extremity Assessment Lower Extremity Assessment: Generalized weakness    Cervical / Trunk Assessment Cervical / Trunk Assessment: Kyphotic  Communication    Communication Communication: No apparent difficulties    Cognition Arousal: Alert Behavior During Therapy: Flat affect   PT - Cognitive impairments: History of cognitive impairments                       PT - Cognition Comments: no hallucintations on eval, knows she is in the hospital. States she has fallen 5000 times but then smiles and states it's been "a lot". Following commands: Intact       Cueing Cueing Techniques: Verbal cues     General Comments General comments (skin integrity, edema, etc.): Discussed d/c options with daughter in law and family really prefers taking pt home if possible because she has had so many recent changes and they are afraid a facility will make things even worse    Exercises     Assessment/Plan    PT Assessment Patient needs continued PT services  PT Problem List Decreased strength;Decreased activity tolerance;Decreased balance;Decreased mobility;Decreased cognition;Decreased safety awareness;Decreased knowledge of precautions       PT Treatment Interventions DME instruction;Gait training;Stair training;Functional mobility training;Therapeutic activities;Therapeutic exercise;Balance training;Neuromuscular re-education;Cognitive remediation;Patient/family education    PT Goals (Current goals can be found in the Care Plan section)  Acute Rehab PT Goals Patient Stated Goal: return home with son PT Goal Formulation: With patient/family Time For Goal Achievement: 06/28/23 Potential to Achieve Goals: Good    Frequency Min 2X/week  Co-evaluation               AM-PAC PT "6 Clicks" Mobility  Outcome Measure Help needed turning from your back to your side while in a flat bed without using bedrails?: A Little Help needed moving from lying on your back to sitting on the side of a flat bed without using bedrails?: A Little Help needed moving to and from a bed to a chair (including a wheelchair)?: A Little Help needed standing up  from a chair using your arms (e.g., wheelchair or bedside chair)?: A Little Help needed to walk in hospital room?: A Lot Help needed climbing 3-5 steps with a railing? : A Lot 6 Click Score: 16    End of Session Equipment Utilized During Treatment: Gait belt Activity Tolerance: Patient tolerated treatment well Patient left: with call bell/phone within reach;with chair alarm set;with family/visitor present;in chair Nurse Communication: Mobility status PT Visit Diagnosis: Unsteadiness on feet (R26.81);Repeated falls (R29.6);Difficulty in walking, not elsewhere classified (R26.2)    Time: 1610-9604 PT Time Calculation (min) (ACUTE ONLY): 34 min   Charges:   PT Evaluation $PT Eval Moderate Complexity: 1 Mod PT Treatments $Gait Training: 8-22 mins PT General Charges $$ ACUTE PT VISIT: 1 Visit         Amey Ka, PT  Acute Rehab Services Secure chat preferred Office [phone]   Deloris Fetters Edelyn Heidel 06/14/2023, 11:51 AM

## 2023-06-14 NOTE — Plan of Care (Signed)

## 2023-06-14 NOTE — Progress Notes (Signed)
 Please be advised that the above-named patient will require a short-term nursing home stay-anticipated 30 days or less for rehabilitation and strengthening. The plan is for return home.

## 2023-06-14 NOTE — Consult Note (Addendum)
 NEUROLOGY CONSULT NOTE   Date of service: Jun 14, 2023 Patient Name: [name] MRN:  [id number] DOB:  1941/01/13 Chief Complaint: "Hallucinations" Requesting Provider: Oral Billings, MD  History of Present Illness  [name] is a 83 y.o. female with hx of Parkinson's disease with hallucination, arthritis, hyperlipidemia, osteoporosis, depression. She follows with Dr. Winferd Hatter at [facility] for her Parkinson's Disease. She was last seen by outpatient Neuro on 06/10/2023.  Family contacted [place] neurology on 5/12 reporting hallucinations and her amantadine  100mg  TID was stopped 5/14.  Current medication regimen: Rytary  145 mg, 1 capsule at 7 AM/11 AM/3 PM/7 PM, clonazepam  0.5 mg at bedtime, and she was Nuplazid  34 mg daily started on 5/16.  She came into the ER on 5/19 due to her hallucinations.  In speaking with the patient and her daughter-in-law who is at the bedside today she states she is not currently having any hallucinations and she was able to sleep last night, however she is reporting difficulty swallowing.  Her daughter-in-law states that today she seems a little bit more tired and " out of it" however the patient herself is oriented x 4 and able to identify her daughter-in-law in the room.  Her daughter-in-law states that she has noticed some progressive decline over the last couple of months that she was initially attributing to the Parkinson's diagnosis, however since Mother's Day has been a more rapid decline with increased confusion.  She has attributed this to increased stressors and depression in relation to the patient's long-term boyfriend moving to an assisted living facility due to his dementia and the patient's daughter's cancer diagnosis. [name] has had less falls over the last couple of weeks due to consistently using her walker, however she has had increased confusion in regards to remembering who people are, recognizing people, and hallucinations including  seeing people that are not there and most recently on 5/18 her daughter-in-law states that she was seeing bugs on the ground.  Patient states her hallucinations are typically worse at night and do improve if the lights are left on, however family notices them during the day as well.    ROS  Comprehensive ROS performed and pertinent positives documented in HPI   Past History   Past Medical History:  Diagnosis Date   Allergy    SEASONAL   Anemia    Anemia-NOS / PMH, Dr Marguerita Shih   Arthritis    Breast cancer [facility])    stage I right breast   Cataract    BILATERAL   Decreased vision    R eye   Esophageal Stricture    Hyperlipidemia    Monoclonal gammopathy    MVP (mitral valve prolapse)    Osteoporosis    Parkinson disease (HCC)    Skin cancer    basal cell L neck   Vertical diplopia     Past Surgical History:  Procedure Laterality Date   APPENDECTOMY  2002   BONE BIOPSY  11/06/10   BREAST LUMPECTOMY  02/2010   right breast lumpectomy and sentinel node biopsy   CARDIAC CATHETERIZATION  2005   neg   CATARACT EXTRACTION, BILATERAL  2018   COLONOSCOPY     negative    HEMORRHOID SURGERY  1970's   SALPINGOOPHORECTOMY  2002   for benign growths   SHOULDER SURGERY Right    TOTAL ABDOMINAL HYSTERECTOMY W/ BILATERAL SALPINGOOPHORECTOMY  83 yrs old   for pain,prolapse ; G3 P2   UPPER GASTROINTESTINAL ENDOSCOPY  Family History: Family History  Problem Relation Age of Onset   Diabetes Mother    Heart disease Mother    Diabetes Brother    Heart disease Brother    Hepatitis Father    Diabetes Brother    Diabetes Brother    Breast cancer Neg Hx    Colon cancer Neg Hx     Social History  reports that she has never smoked. She has never used smokeless tobacco. She reports that she does not currently use alcohol after a past usage of about 1.0 standard drink of alcohol per week. She reports that she does not use drugs.  Allergies  Allergen Reactions   Penicillins  Shortness Of Breath and Itching    Medications   Current Facility-Administered Medications:    Carbidopa -Levodopa  ER 36.25-145 MG CPCR 1 capsule, 1 capsule, Oral, QID, Kakrakandy, Arshad N, MD   clonazePAM  (KLONOPIN ) disintegrating tablet 0.25 mg, 0.25 mg, Oral, QHS, Angelene Kelly, MD, 0.25 mg at 06/14/23 0013   enoxaparin  (LOVENOX ) injection 30 mg, 30 mg, Subcutaneous, Daily, Angelene Kelly, MD   pantoprazole  (PROTONIX ) EC tablet 40 mg, 40 mg, Oral, Daily, Angelene Kelly, MD   Pimavanserin  Tartrate CAPS 34 mg, 34 mg, Oral, QHS, Angelene Kelly, MD, 34 mg at 06/14/23 0012   venlafaxine  XR (EFFEXOR -XR) 24 hr capsule 112.5 mg, 112.5 mg, Oral, Q breakfast, Angelene Kelly, MD  Vitals   Vitals:   06/13/23 1750 06/13/23 1849 06/14/23 0513 06/14/23 0721  BP: 139/70 (!) 148/82 (!) 116/96 132/74  Pulse: 76 83 72 93  Resp: 20  18   Temp:   98.9 F (37.2 C)   TempSrc:      SpO2: 100% 100% 100% 99%    There is no height or weight on file to calculate BMI.  Physical Exam   Constitutional: Thin, elderly, Caucasian female Psych: Affect flat Eyes: No scleral injection.  HENT: No OP obstruction.  Head: Normocephalic.  Respiratory: Effort normal, non-labored breathing.  GI: Soft.  No distension. There is no tenderness.    Neurologic Examination    Physical Exam  Constitutional: Appears well-developed and well-nourished.   Cardiovascular: Normal rate and regular rhythm.  Respiratory: Effort normal, non-labored breathing  Neuro: Mental Status: Patient is awake, drowsy, oriented to person, place, month, year.  Able to give a brief history. Able to tell me that she is in the hospital because she was seeing things. No signs of aphasia or neglect Cranial Nerves: II: Visual Fields are full. Pupils are equal, round, and reactive to light.   III,IV, VI: No diplopia, slight R esotropia - family states this fluctuates at baseline   V: Facial sensation is  symmetric to temperature VII: Facial movement is symmetric resting and smiling; masked facies VIII: Hearing is intact to voice X: Speech is hypophonic  XI: Shoulder shrug is symmetric. XII: Tongue protrudes midline without atrophy or fasciculations.  Motor: Tone is normal. Bulk is normal. 5/5 strength was present in all four extremities. No drift Sensory: Sensation is symmetric to light touch and temperature in the arms and legs. No extinction to DSS present.  Cerebellar: FNF intact, but slow. No ataxia. Pill rolling tremor more prominent in left hand; also noted in her right foot at times. No severe dyskinseses at time of attending eval at 11:20 AM Gait:  With walker, steady, slightly slow with stopped posture, mildly shuffling; at baseline per family      Labs/Imaging/Neurodiagnostic studies   CBC:  Recent Labs  Lab 06/13/23 0909 06/13/23 2220 06/14/23 0526  WBC 4.0 5.0 5.9  NEUTROABS 2.2  --   --   HGB 12.6 12.4 13.5  HCT 38.8 37.8 41.2  MCV 102.6* 102.7* 102.2*  PLT 229 200 221   Basic Metabolic Panel:  Lab Results  Component Value Date   NA 140 06/14/2023   K 4.1 06/14/2023   CO2 24 06/14/2023   GLUCOSE 96 06/14/2023   BUN 10 06/14/2023   CREATININE 0.64 06/14/2023   CALCIUM 9.1 06/14/2023   GFRNONAA >60 06/14/2023   GFRAA >60 10/11/2019   Lipid Panel:  Lab Results  Component Value Date   LDLCALC 78 01/29/2019   HgbA1c:  Lab Results  Component Value Date   HGBA1C 5.8 01/29/2019   Lab Results  Component Value Date   TSH 5.569 (H) 06/13/2023   VITAMINB12 392 06/13/2023    Urinalysis: Negative for infection CXR: Negative for active disease  CT Head without contrast(Personally reviewed): Stable and normal for age noncontrast CT appearance of the brain.   ASSESSMENT   Joslynne LAURIE PENADO is a 83 y.o. female with a past medical history of Parkinson disease who currently in the hospital for increasing hallucinations.  She is currently following with  [place] neurology (Dr. Winferd Hatter).  She did have medications adjusted at her visit on 5/16.  She was started on Nuplazid  on 5/16.  Prior to that her amantadine  was stopped 5/14 due to increasing hallucinations. She is also currently on Rytary  145 mg 4 times per day and clonazepam  0.5 mg HS.  So far her workup has been negative for any infective source that could be contributing to her altered mental status.  TSH is slightly elevated, free T4 is pending.  On exam today Ms. Eulas Hick is drowsy, but oriented x 4.  She did eat some of her breakfast this morning.  She denies hallucinations at this time or overnight.  She says she was able to sleep well.  Impression: - Parkinson's with expected gradual progression with some hallucinations at times  RECOMMENDATIONS  - Free T4 added on, treatment per primary - B12 1000 mcg daily for goal level > 500 - Nuplazid  34 mg daily started on 5/16; takes 6 weeks to see full effect, continue - Continue medication regimen: Rytary  145 mg, 1 capsule at 7 AM/11 AM/3 PM/7 PM, stay off amantadine  at this time - Continue clonazepam  0.5 mg at bedtime - Miralax scheduled for 3 doses, to promote BM, further titration of bowel regimen per primary team to achieve at least 1 BM q48 hours - May need higher level of care if family is unable to provide full time supervision - Continue outpatient follow-up with Dr. Winferd Hatter  - Appreciate medical workup per primary team. Inpatient neurology will sign off at this time.  ______________________________________________________________________    Arletha Bend, NP [facility]  Attending Neurologist's note:  I personally saw this patient, gathering history, performing a full neurologic examination, reviewing relevant labs, personally reviewing relevant imaging including head CT, and formulated the assessment and plan, adding the note above for completeness and clarity to accurately reflect my thoughts  Baldwin Levee  MD-PhD [facility] [phone]  >60 min in care of patient, majority at bedside

## 2023-06-14 NOTE — Care Management Obs Status (Signed)
 MEDICARE OBSERVATION STATUS NOTIFICATION   Patient Details  Name: [name] MRN: [id number] Date of Birth: [birth year]   Medicare Observation Status Notification Given:  Yes Moon/Obw letter given to daughter in law [place] Leap 06/14/2023, 10:20 AM

## 2023-06-14 NOTE — TOC Progression Note (Signed)
 Transition of Care [facility]) - Progression Note    Patient Details  Name: [name] MRN: [id number] Date of Birth: 11-Feb-1940  [facility]) CM/SW Contact  Tom-Johnson, Edmund Rick Daphne, RN Phone Number: 06/14/2023, 4:02 PM  Clinical Narrative:     CM spoke with patient, her son Rosalva Comber and daughter at bedside about post hospital transition at discharge. Julius informed CM that patient will be moving in with him and his wife at discharge. States they work all day starting from 5 am till most times 7 pm. States patient will not be safe alone.  OT at bedside during this conversation. Disposition options, Home Care brochures including [facility] given to Julius and patient. CM awaiting PT/OT recommendations.   Patient not Medically ready for discharge.  CM will continue to follow as patient progresses with care towards discharge.           Expected Discharge Plan:  (TBD) Barriers to Discharge: Continued Medical Work up  Expected Discharge Plan and Services       Living arrangements for the past 2 months: Single Family Home                                       Social Determinants of Health (SDOH) Interventions SDOH Screenings   Food Insecurity: No Food Insecurity (06/14/2023)  Housing: Low Risk  (06/14/2023)  Transportation Needs: No Transportation Needs (06/14/2023)  Utilities: Not At Risk (06/14/2023)  Alcohol Screen: Low Risk  (11/22/2022)  Depression (PHQ2-9): Medium Risk (11/29/2022)  Financial Resource Strain: Low Risk  (11/22/2022)  Physical Activity: Inactive (11/22/2022)  Social Connections: Socially Isolated (06/14/2023)  Stress: No Stress Concern Present (11/22/2022)  Tobacco Use: Low Risk  (06/13/2023)  Health Literacy: Adequate Health Literacy (11/22/2022)    Readmission Risk Interventions     No data to display

## 2023-06-14 NOTE — Hospital Course (Signed)
 83 y.o. female with history of Parkinson's disease has been having increasing hallucination over the last 2 weeks.  Patient's son provided the history.  Patient's son states that about a week ago patient had severe hallucination and was taken to the ER and at the ER CT head was unremarkable and most of the lab works were unremarkable and was advised to follow-up with neurologist.  Patient follows with her neurologist and patient's amantadine  was discontinued and started on Nuplazid .  So far patient has taken 2 dose of Nuplazid .  As per the son patient is becoming increasingly confused and not able to recognize patient's son.  Over the last one week patient has been staying at her son's house.  Has not had any nausea vomiting diarrhea chest pain shortness of breath fever chills productive cough.  Patient's son is not sure if she has been taking her medicines as advised previously but while in his house he has she has been taking it regularly.   Patient's significant other has been recently placed in an assisted living facility.

## 2023-06-14 NOTE — Progress Notes (Signed)
  Progress Note   Patient: [name] VWU:[id number] DOB: 27-Nov-1940 DOA: 06/13/2023     0 DOS: the patient was seen and examined on 06/14/2023   Brief hospital course:  83 y.o. female with history of Parkinson's disease has been having increasing hallucination over the last 2 weeks.  Patient's son provided the history.  Patient's son states that about a week ago patient had severe hallucination and was taken to the ER and at the ER CT head was unremarkable and most of the lab works were unremarkable and was advised to follow-up with neurologist.  Patient follows with her neurologist and patient's amantadine  was discontinued and started on Nuplazid .  So far patient has taken 2 dose of Nuplazid .  As per the son patient is becoming increasingly confused and not able to recognize patient's son.  Over the last one week patient has been staying at her son's house.  Has not had any nausea vomiting diarrhea chest pain shortness of breath fever chills productive cough.  Patient's son is not sure if she has been taking her medicines as advised previously but while in his house he has she has been taking it regularly.   Patient's significant other has been recently placed in an assisted living facility.  Assessment and Plan:  Acute encephalopathy/hallucination  patient was recently started on Nuplazid  for hallucination in the setting of Parkinson's disease.  Neurology consulted. Recs for clonazepam  0.5mg  at bedtime, cont rytary . Continue Nuplazid  that was started on 5/16 and may take 6 weeks to see full effect Parkinson's disease  Cont meds per above PT/OT consulted recs for SNF noted. TOC following RBD Continued on Klonopin . Depression on Effexor . Sialorrhea on Xeomin  injections. Macrocytosis  B12 and folate levels within normal limits     Subjective: Without comlaints today  Physical Exam: Vitals:   06/13/23 1750 06/13/23 1849 06/14/23 0513 06/14/23 0721  BP: 139/70 (!) 148/82 (!) 116/96  132/74  Pulse: 76 83 72 93  Resp: 20  18   Temp:   98.9 F (37.2 C)   TempSrc:      SpO2: 100% 100% 100% 99%   General exam: Awake, laying in bed, in nad Respiratory system: Normal respiratory effort, no wheezing Cardiovascular system: regular rate, s1, s2 Gastrointestinal system: Soft, nondistended, positive BS Central nervous system: CN2-12 grossly intact, strength intact Extremities: Perfused, no clubbing Skin: Normal skin turgor, no notable skin lesions seen Psychiatry: Mood normal // no visual hallucinations   Data Reviewed:  Labs reviewed: Na 140, K 4.1, Cr 0.64, WBC 5.9, hgb 13.5, Plts 221   Family Communication: Pt in room, family at bedside  Disposition: Status is: Observation The patient remains OBS appropriate and will d/c before 2 midnights.  Planned Discharge Destination: Skilled nursing facility    Author: Cherylle Corwin, MD 06/14/2023 3:49 PM  For on call review [email or web address].

## 2023-06-15 DIAGNOSIS — G9340 Encephalopathy, unspecified: Secondary | ICD-10-CM | POA: Diagnosis not present

## 2023-06-15 DIAGNOSIS — G9341 Metabolic encephalopathy: Secondary | ICD-10-CM | POA: Diagnosis not present

## 2023-06-15 LAB — CBC
HCT: 37.8 % (ref 36.0–46.0)
Hemoglobin: 12.2 g/dL (ref 12.0–15.0)
MCH: 33.2 pg (ref 26.0–34.0)
MCHC: 32.3 g/dL (ref 30.0–36.0)
MCV: 102.7 fL — ABNORMAL HIGH (ref 80.0–100.0)
Platelets: 193 10*3/uL (ref 150–400)
RBC: 3.68 MIL/uL — ABNORMAL LOW (ref 3.87–5.11)
RDW: 13.7 % (ref 11.5–15.5)
WBC: 5.5 10*3/uL (ref 4.0–10.5)
nRBC: 0 % (ref 0.0–0.2)

## 2023-06-15 LAB — COMPREHENSIVE METABOLIC PANEL WITH GFR
ALT: 5 U/L (ref 0–44)
AST: 21 U/L (ref 15–41)
Albumin: 3 g/dL — ABNORMAL LOW (ref 3.5–5.0)
Alkaline Phosphatase: 57 U/L (ref 38–126)
Anion gap: 8 (ref 5–15)
BUN: 11 mg/dL (ref 8–23)
CO2: 26 mmol/L (ref 22–32)
Calcium: 8.6 mg/dL — ABNORMAL LOW (ref 8.9–10.3)
Chloride: 106 mmol/L (ref 98–111)
Creatinine, Ser: 0.61 mg/dL (ref 0.44–1.00)
GFR, Estimated: >60 mL/min (ref >60)
Glucose, Bld: 94 mg/dL (ref 70–99)
Potassium: 3.8 mmol/L (ref 3.5–5.1)
Sodium: 140 mmol/L (ref 135–145)
Total Bilirubin: 0.7 mg/dL (ref 0.0–1.2)
Total Protein: 5.8 g/dL — ABNORMAL LOW (ref 6.5–8.1)

## 2023-06-15 LAB — MISC LABCORP TEST (SEND OUT): Labcorp test code: 83935

## 2023-06-15 NOTE — Progress Notes (Signed)
 Physical Therapy Treatment Patient Details Name: [name] MRN: [id number] DOB: 10/29/1940 Today's Date: 06/15/2023   History of Present Illness Pt is 83 yo female who presents on 06/13/23 with increased hallucinations over 2 weeks per son. PMH: Parkinsons, breast cancer, HLD, vertical diplopia, arthritis, monoclonal gammopathy    PT Comments  Pt seen for PT tx with pt agreeable, daughter present in room. Pt pleasant throughout session, flat affect, follows simple cuing. Pt is able to ambulate with RW & CGA but decreased awareness as pt bumping into objects, veering into walls but then able to correct without assistance. Pt ambulates in room without AD with min assist with more shuffled gait pattern. Pt would benefit from ongoing PT services to progress balance, gait with LRAD, & improve safety awareness & overall awareness with mobility.    If plan is discharge home, recommend the following: A little help with walking and/or transfers;A little help with bathing/dressing/bathroom;Assistance with cooking/housework;Direct supervision/assist for medications management;Direct supervision/assist for financial management;Assist for transportation;Help with stairs or ramp for entrance;Supervision due to cognitive status   Can travel by private vehicle     Yes  Equipment Recommendations  None recommended by PT    Recommendations for Other Services       Precautions / Restrictions Precautions Precautions: Fall Restrictions Weight Bearing Restrictions Per Provider Order: No     Mobility  Bed Mobility               General bed mobility comments: not tested, pt received & left sitting in recliner    Transfers Overall transfer level: Needs assistance Equipment used: None, Rolling walker (2 wheels) Transfers: Sit to/from Stand Sit to Stand: Min assist           General transfer comment: sit<>stand with RW & min assist 2/2 posterior lean, poor awareness of hand placement  during stand>sit requiring cuing for eccentric lowering with poor return demo, sit<>stand without BUE support with CGA but pt leaning posteriorly on chair with BLE    Ambulation/Gait Ambulation/Gait assistance: Min assist, Contact guard assist Gait Distance (Feet): 200 Feet (+ 15 ft + 25 ft) Assistive device: Rolling walker (2 wheels) Gait Pattern/deviations: Decreased step length - right, Decreased step length - left, Decreased stride length, Narrow base of support Gait velocity: decreased     General Gait Details: Pt initially ambulates in hallway with RW & CGA with decreased awareness as pt bumping into objects, veering into wall with RW, but is able to correct without assistance. Pt then ambulates 15 ft + 25 ft in room & bathroom without AD with min assist with shuffling gait pattern.   Stairs             Wheelchair Mobility     Tilt Bed    Modified Rankin (Stroke Patients Only)       Balance Overall balance assessment: Needs assistance, History of Falls Sitting-balance support: No upper extremity supported, Feet supported Sitting balance-Leahy Scale: Good Sitting balance - Comments: supervision sitting on toilet, performing peri hygiene   Standing balance support: No upper extremity supported, During functional activity Standing balance-Leahy Scale: Poor                              Communication Communication Communication: No apparent difficulties  Cognition Arousal: Alert Behavior During Therapy: Flat affect   PT - Cognitive impairments: History of cognitive impairments  Following commands: Impaired Following commands impaired: Follows one step commands with increased time    Cueing Cueing Techniques: Verbal cues  Exercises Other Exercises Other Exercises: Pt performed 5x sit<>stand from recliner without BUE support with CGA with focus on BLE strengthening & endurance training; pt leaning somewhat  posteriorly on recliner with BLE.    General Comments General comments (skin integrity, edema, etc.): Pt with continent void & BM, performing peri hygiene.      Pertinent Vitals/Pain Pain Assessment Pain Assessment: No/denies pain    Home Living                          Prior Function            PT Goals (current goals can now be found in the care plan section) Acute Rehab PT Goals Patient Stated Goal: return home with son PT Goal Formulation: With patient/family Time For Goal Achievement: 06/28/23 Potential to Achieve Goals: Fair Progress towards PT goals: Progressing toward goals;Goals updated    Frequency    Min 2X/week      PT Plan      Co-evaluation              AM-PAC PT "6 Clicks" Mobility   Outcome Measure  Help needed turning from your back to your side while in a flat bed without using bedrails?: None Help needed moving from lying on your back to sitting on the side of a flat bed without using bedrails?: A Little Help needed moving to and from a bed to a chair (including a wheelchair)?: A Little Help needed standing up from a chair using your arms (e.g., wheelchair or bedside chair)?: A Little Help needed to walk in hospital room?: A Little Help needed climbing 3-5 steps with a railing? : A Little 6 Click Score: 19    End of Session   Activity Tolerance: Patient tolerated treatment well Patient left: with chair alarm set;in chair;with call bell/phone within reach;with family/visitor present   PT Visit Diagnosis: Unsteadiness on feet (R26.81);Repeated falls (R29.6);Difficulty in walking, not elsewhere classified (R26.2)     Time: 4034-7425 PT Time Calculation (min) (ACUTE ONLY): 14 min  Charges:    $Therapeutic Activity: 8-22 mins PT General Charges $$ ACUTE PT VISIT: 1 Visit                     Emaline Handsome, PT, DPT 06/15/23, 9:39 AM   Venetta Gill 06/15/2023, 9:38 AM

## 2023-06-15 NOTE — TOC Progression Note (Addendum)
 Transition of Care [facility]) - Progression Note    Patient Details  Name: [name] MRN: [id number] Date of Birth: 1940/07/23  Transition of [facility]) CM/SW Contact  [name], LCSWA Phone Number: 06/15/2023, 1:58 PM  Clinical Narrative:     CSW reached out to [facility] to see if patient eligable to use [facility] waiver. [facility] informed CSW patient is eligable to use the waiver.Patient agreeable for CSW to fax out for SNF placement. CSW faxed patient out to eligable facilities for possible SNF placement.  Once bed offers are in and patient confirms facility choice, CSW will need to let [facility] know facility choice.Patient passr is pending. CSW submitted requested clinicals to [facility] to review. CSW updated patient and patients son [name].CSW will continue to follow and assist with patients dc planning needs.  Expected Discharge Plan:  (TBD) Barriers to Discharge: Continued Medical Work up  Expected Discharge Plan and Services       Living arrangements for the past 2 months: Single Family Home                                       Social Determinants of Health (SDOH) Interventions SDOH Screenings   Food Insecurity: No Food Insecurity (06/14/2023)  Housing: Low Risk  (06/14/2023)  Transportation Needs: No Transportation Needs (06/14/2023)  Utilities: Not At Risk (06/14/2023)  Alcohol Screen: Low Risk  (11/22/2022)  Depression (PHQ2-9): Medium Risk (11/29/2022)  Financial Resource Strain: Low Risk  (11/22/2022)  Physical Activity: Inactive (11/22/2022)  Social Connections: Socially Isolated (06/14/2023)  Stress: No Stress Concern Present (11/22/2022)  Tobacco Use: Low Risk  (06/13/2023)  Health Literacy: Adequate Health Literacy (11/22/2022)    Readmission Risk Interventions     No data to display

## 2023-06-15 NOTE — Progress Notes (Signed)
 TRH ROUNDING NOTE [name] ZOX:[id number]  DOB: 1940-04-05  DOA: 06/13/2023  PCP: Donnie Galea, MD  06/15/2023,6:32 AM  LOS: 0 days    Code Status: DNR   from: Home current Dispo: Unclear   83 year old white female-breast cancer in 2007 with monoclonal gammopathy--- had lumpectomy and adjuvant radiotherapy in 2012 Parkinson's disease with dyskinesia on Rytary  and amantadine  Depression  5/19 brought to emergency room for increased confusion and altered mental status--- she had been seen in the ED confusion meds were adjusted by her neurologist-noted recent fall 140 potassium 4.1/creatinine 10/0.6 LFTs normal WBC 5.0 hemoglobin 12.4 platelet 200 TSH 5.5 FT4 0.8, UA negative, RPR negative CT head stable noncontrast CT brain COPD no active disease?  COPD  Plan  Metabolic encephalopathy secondary to underlying parkinsonism Has hallucinations at times but seems to be stabilized currently and is continuing carbidopa  ER 1 capsule 4 times daily in addition to pimavanserin  34 at bedtime For confusion may be able to use Klonopin  0.25 at bedtime  Depression Continue Effexor  112.5 daily  Prior breast cancer 2007 Status postlumpectomy and adjuvant radiotherapy Outpatient follow-up with Dr. Liam Redhead  Abnormal TSH--subclinical hypothyroidism? Recheck TSH in about 3 weeks at baseline is not on any replacement therapy  DVT prophylaxis: Lovenox   Status is: Observation The patient will require care spanning > 2 midnights and should be moved to inpatient because:   Await skilled      Subjective: Pleasant coherent can tell me that she is in the hospital knows the year and the president She was little confused overnight according to her son who is at the bedside  Objective + exam Vitals:   06/14/23 0721 06/14/23 1713 06/14/23 2034 06/15/23 0427  BP: 132/74 132/80 132/68 133/69  Pulse: 93 86 83 78  Resp:  18 20 20   Temp:   97.8 F (36.6 C) 97.8 F (36.6 C)  TempSrc:   Oral  Oral  SpO2: 99% 100% 100% 99%  Weight:    44 kg   Filed Weights   06/15/23 0427  Weight: 44 kg    Examination: Mildly tremulous no icterus no pallor CTAB no added sound no wheeze no rales no rhonchi ROM is intact moving limbs equally x 4 Abdominal soft no rebound no guarding ROM is intact finger-nose-finger intact trace lower extremity edema, power grossly 5/5 Cranial nerves deferred S1-S2?  Murmur LLSE  Data Reviewed: reviewed   CBC    Component Value Date/Time   WBC 5.5 06/15/2023 0452   RBC 3.68 (L) 06/15/2023 0452   HGB 12.2 06/15/2023 0452   HGB 13.6 05/05/2023 0854   HGB 12.3 10/13/2016 1141   HCT 37.8 06/15/2023 0452   HCT 36.7 10/13/2016 1141   PLT 193 06/15/2023 0452   PLT 175 05/05/2023 0854   PLT 226 10/13/2016 1141   MCV 102.7 (H) 06/15/2023 0452   MCV 98.7 10/13/2016 1141   MCH 33.2 06/15/2023 0452   MCHC 32.3 06/15/2023 0452   RDW 13.7 06/15/2023 0452   RDW 14.5 10/13/2016 1141   LYMPHSABS 1.3 06/13/2023 0909   LYMPHSABS 2.1 10/13/2016 1141   MONOABS 0.2 06/13/2023 0909   MONOABS 0.3 10/13/2016 1141   EOSABS 0.2 06/13/2023 0909   EOSABS 0.5 10/13/2016 1141   BASOSABS 0.0 06/13/2023 0909   BASOSABS 0.0 10/13/2016 1141      Latest Ref Rng & Units 06/15/2023    4:52 AM 06/14/2023    5:26 AM 06/13/2023   10:20 PM  CMP  Glucose 70 -  99 mg/dL 94  96    BUN 8 - 23 mg/dL 11  10    Creatinine 1.61 - 1.00 mg/dL 0.96  0.45  4.09   Sodium 135 - 145 mmol/L 140  140    Potassium 3.5 - 5.1 mmol/L 3.8  4.1    Chloride 98 - 111 mmol/L 106  106    CO2 22 - 32 mmol/L 26  24    Calcium 8.9 - 10.3 mg/dL 8.6  9.1    Total Protein 6.5 - 8.1 g/dL 5.8  6.5    Total Bilirubin 0.0 - 1.2 mg/dL 0.7  0.5    Alkaline Phos 38 - 126 U/L 57  56    AST 15 - 41 U/L 21  24    ALT 0 - 44 U/L 5  <5      Scheduled Meds:  aspirin EC  81 mg Oral Daily   Carbidopa -Levodopa  ER  1 capsule Oral QID   clonazepam   0.25 mg Oral QHS   vitamin B-12  1,000 mcg Oral Daily   enoxaparin   (LOVENOX ) injection  30 mg Subcutaneous Daily   pantoprazole   40 mg Oral Daily   Pimavanserin  Tartrate  34 mg Oral QHS   polyethylene glycol  17 g Oral BID   venlafaxine  XR  112.5 mg Oral Q breakfast   Continuous Infusions:  Time 27  Megan Margot Oriordan, MD  [facility]

## 2023-06-16 DIAGNOSIS — G9340 Encephalopathy, unspecified: Secondary | ICD-10-CM | POA: Diagnosis not present

## 2023-06-16 DIAGNOSIS — G9341 Metabolic encephalopathy: Secondary | ICD-10-CM | POA: Diagnosis not present

## 2023-06-16 NOTE — Progress Notes (Signed)
 Occupational Therapy Treatment Patient Details Name: [name] MRN: [id number] DOB: 06-Dec-1940 Today's Date: 06/16/2023   History of present illness Pt is 83 yo female who presents on 06/13/23 with increased hallucinations over 2 weeks per son. PMH: Parkinsons, breast cancer, HLD, vertical diplopia, arthritis, monoclonal gammopathy   OT comments  Patient making good gains with OT treatment. Patient able to stand from recliner with CGA and cues for hand placement and was able to stand for gown change, due to dirty from lunch, with min to CGA due to posterior leaning. Patient able to stand at sink for hand and face hygiene with CGA and no UE support. Patient performed mobility and functional transfers with cues to attend to left and to avoid running into items on left. Patient claims she veered to left due to RW but son states she was doing the same at home. Patient will benefit from continued inpatient follow up therapy, <3 hours/day. Acute OT to continue to follow to address established goals to facilitate DC to next venue of care.        If plan is discharge home, recommend the following:  A little help with walking and/or transfers;A lot of help with bathing/dressing/bathroom;Assistance with cooking/housework;Direct supervision/assist for medications management;Direct supervision/assist for financial management;Assist for transportation;Help with stairs or ramp for entrance;Supervision due to cognitive status   Equipment Recommendations  Other (comment) (defer)    Recommendations for Other Services      Precautions / Restrictions Precautions Precautions: Fall Recall of Precautions/Restrictions: Impaired Restrictions Weight Bearing Restrictions Per Provider Order: No       Mobility Bed Mobility Overal bed mobility: Needs Assistance             General bed mobility comments: OOB in recliner    Transfers Overall transfer level: Needs assistance Equipment used: Rolling  walker (2 wheels) Transfers: Sit to/from Stand Sit to Stand: Contact guard assist           General transfer comment: CGA to stand from recliner with cues for hand placement     Balance Overall balance assessment: Needs assistance, History of Falls Sitting-balance support: No upper extremity supported, Feet supported Sitting balance-Leahy Scale: Good Sitting balance - Comments: seated on recliner without back support Postural control: Posterior lean Standing balance support: Bilateral upper extremity supported, No upper extremity supported, During functional activity Standing balance-Leahy Scale: Poor Standing balance comment: able to stand at sink for hand and face hygiene without UE support                           ADL either performed or assessed with clinical judgement   ADL Overall ADL's : Needs assistance/impaired     Grooming: Wash/dry hands;Wash/dry face;Contact guard assist;Standing           Upper Body Dressing : Minimal assistance;Standing Upper Body Dressing Details (indicate cue type and reason): patient stood to change gown with min assist and CGA for balance     Toilet Transfer: Contact guard assist;Ambulation;Rolling walker (2 wheels) Toilet Transfer Details (indicate cue type and reason): simulated         Functional mobility during ADLs: Contact guard assist;Rolling walker (2 wheels) General ADL Comments: CGA for functional transfers and mobility due to posterior leaning    Extremity/Trunk Assessment              Vision       Perception     Praxis     Communication  Communication Communication: No apparent difficulties   Cognition Arousal: Alert Behavior During Therapy: Flat affect Cognition: Cognition impaired   Orientation impairments: Situation Awareness: Intellectual awareness impaired Memory impairment (select all impairments): Short-term memory Attention impairment (select first level of impairment): Focused  attention Executive functioning impairment (select all impairments): Initiation, Sequencing OT - Cognition Comments: unaware she runs into items on left, states the walker is causing her to go to left                 Following commands: Impaired Following commands impaired: Follows one step commands with increased time      Cueing   Cueing Techniques: Verbal cues  Exercises      Shoulder Instructions       General Comments Patient's daughter, son and daughter in law present during session    Pertinent Vitals/ Pain       Pain Assessment Pain Assessment: No/denies pain  Home Living                                          Prior Functioning/Environment              Frequency  Min 2X/week        Progress Toward Goals  OT Goals(current goals can now be found in the care plan section)  Progress towards OT goals: Progressing toward goals  Acute Rehab OT Goals Patient Stated Goal: none stated OT Goal Formulation: With patient Time For Goal Achievement: 06/28/23 Potential to Achieve Goals: Good ADL Goals Pt Will Perform Upper Body Dressing: with modified independence;sitting Pt Will Perform Lower Body Dressing: with modified independence;sitting/lateral leans;sit to/from stand Pt Will Transfer to Toilet: with modified independence;ambulating;regular height toilet Pt Will Perform Tub/Shower Transfer: Tub transfer;Shower transfer;with modified independence;ambulating Additional ADL Goal #1: pt will perform 3 step task with min cues in prep for ADLs  Plan      Co-evaluation                 AM-PAC OT "6 Clicks" Daily Activity     Outcome Measure   Help from another person eating meals?: A Little Help from another person taking care of personal grooming?: A Little Help from another person toileting, which includes using toliet, bedpan, or urinal?: A Little Help from another person bathing (including washing, rinsing, drying)?: A  Lot Help from another person to put on and taking off regular upper body clothing?: A Little Help from another person to put on and taking off regular lower body clothing?: A Lot 6 Click Score: 16    End of Session Equipment Utilized During Treatment: Gait belt;Rolling walker (2 wheels)  OT Visit Diagnosis: Muscle weakness (generalized) (M62.81);Other symptoms and signs involving cognitive function;History of falling (Z91.81);Other abnormalities of gait and mobility (R26.89);Unsteadiness on feet (R26.81)   Activity Tolerance Patient tolerated treatment well   Patient Left in chair;with call bell/phone within reach;with chair alarm set;with family/visitor present   Nurse Communication Mobility status        Time: 1430-1453 OT Time Calculation (min): 23 min  Charges: OT General Charges $OT Visit: 1 Visit OT Treatments $Self Care/Home Management : 8-22 mins $Therapeutic Activity: 8-22 mins  Anitra Barn, OTA Acute Rehabilitation Services  Office [phone]   Jovita Nipper 06/16/2023, 3:12 PM

## 2023-06-16 NOTE — Plan of Care (Signed)

## 2023-06-16 NOTE — TOC Progression Note (Addendum)
 Transition of Care [facility]) - Progression Note    Patient Details  Name: [name] MRN: [id number] Date of Birth: 1940/11/05  [facility]) CM/SW Contact  Katrinka Parr, [place] Phone Number: 06/16/2023, 9:23 AM  Clinical Narrative:     Pt has no bed offers this morning. CSW contacted [facility] waiver facilities requesting review of referrals.   PASRR still pending.  1030: CSW met with pt and pt's son bedside. Explained [facility] 3-day waiver and limited SNF options. CSW provided son with 3-day waiver info sheet with SNF list. Explained no offers currently. CSW will continue to seek SNF bed.   1400: CSW met with pt and family bedside. Provided SNF bed offers and medicare star ratings. Family would like to move forward with [facility] for rehab. Pt can admit once PASRR is received. PASRR still pending at this time.   Expected Discharge Plan:  (TBD) Barriers to Discharge: Continued Medical Work up  Expected Discharge Plan and Services       Living arrangements for the past 2 months: Single Family Home                                       Social Determinants of Health (SDOH) Interventions SDOH Screenings   Food Insecurity: No Food Insecurity (06/14/2023)  Housing: Low Risk  (06/14/2023)  Transportation Needs: No Transportation Needs (06/14/2023)  Utilities: Not At Risk (06/14/2023)  Alcohol Screen: Low Risk  (11/22/2022)  Depression (PHQ2-9): Medium Risk (11/29/2022)  Financial Resource Strain: Low Risk  (11/22/2022)  Physical Activity: Inactive (11/22/2022)  Social Connections: Socially Isolated (06/14/2023)  Stress: No Stress Concern Present (11/22/2022)  Tobacco Use: Low Risk  (06/13/2023)  Health Literacy: Adequate Health Literacy (11/22/2022)    Readmission Risk Interventions     No data to display

## 2023-06-16 NOTE — Progress Notes (Signed)
 TRH ROUNDING NOTE [name] ZOX:[id number]  DOB: 11/16/1940  DOA: 06/13/2023  PCP: Donnie Galea, MD  06/16/2023,1:39 PM  LOS: 0 days    Code Status: DNR   from: Home current Dispo: Unclear   83 year old white female-breast cancer in 2007 with monoclonal gammopathy--- had lumpectomy and adjuvant radiotherapy in 2012 Parkinson's disease with dyskinesia on Rytary  and amantadine  Depression  5/19 brought to emergency room for increased confusion and altered mental status--- she had been seen in the ED confusion meds were adjusted by her neurologist-noted recent fall 140 potassium 4.1/creatinine 10/0.6 LFTs normal WBC 5.0 hemoglobin 12.4 platelet 200 TSH 5.5 FT4 0.8, UA negative, RPR negative CT head stable noncontrast CT brain COPD no active disease?  COPD  Plan  Metabolic encephalopathy secondary to underlying parkinsonism Has hallucinations at times --none today and seems close to mental baseline continuing carbidopa  ER 1 capsule 4 times daily in addition to pimavanserin  34 at bedtime For confusion may be able to use Klonopin  0.25 at bedtime--wouldn't d/c on it possible  Depression Continue Effexor  112.5 daily  Prior breast cancer 2007 Status postlumpectomy and adjuvant radiotherapy Outpatient follow-up with Dr. Liam Redhead  Abnormal TSH--subclinical hypothyroidism? Recheck TSH in about 3 weeks at baseline is not on any replacement therapy  D/w family a tthe bedside on 5/21 and 5/22  DVT prophylaxis: Lovenox   Status is: Observation The patient will require care spanning > 2 midnights and should be moved to inpatient because:   Await skilled      Subjective:  Less confused no hallucination No fever no chills No n/v  Objective + exam Vitals:   06/15/23 1636 06/15/23 2025 06/16/23 0559 06/16/23 0833  BP: (!) 159/85 126/61 139/60 125/74  Pulse: 76 85 70 95  Resp: 19 18 18 19   Temp:  98.3 F (36.8 C) 98.7 F (37.1 C)   TempSrc:  Oral    SpO2: 99% 98% 100% 98%   Weight:       Filed Weights   06/15/23 0427  Weight: 44 kg    Examination:  Mildly tremulous no icterus no pallor CTAB no added sound no wheeze no rales no rhonchi ROM is intact moving limbs equally x 4 Abdominal soft no rebound no guarding Power grossly intact.  Moving limbs x 4 equally--slight tremor  Data Reviewed: reviewed   CBC    Component Value Date/Time   WBC 5.5 06/15/2023 0452   RBC 3.68 (L) 06/15/2023 0452   HGB 12.2 06/15/2023 0452   HGB 13.6 05/05/2023 0854   HGB 12.3 10/13/2016 1141   HCT 37.8 06/15/2023 0452   HCT 36.7 10/13/2016 1141   PLT 193 06/15/2023 0452   PLT 175 05/05/2023 0854   PLT 226 10/13/2016 1141   MCV 102.7 (H) 06/15/2023 0452   MCV 98.7 10/13/2016 1141   MCH 33.2 06/15/2023 0452   MCHC 32.3 06/15/2023 0452   RDW 13.7 06/15/2023 0452   RDW 14.5 10/13/2016 1141   LYMPHSABS 1.3 06/13/2023 0909   LYMPHSABS 2.1 10/13/2016 1141   MONOABS 0.2 06/13/2023 0909   MONOABS 0.3 10/13/2016 1141   EOSABS 0.2 06/13/2023 0909   EOSABS 0.5 10/13/2016 1141   BASOSABS 0.0 06/13/2023 0909   BASOSABS 0.0 10/13/2016 1141      Latest Ref Rng & Units 06/15/2023    4:52 AM 06/14/2023    5:26 AM 06/13/2023   10:20 PM  CMP  Glucose 70 - 99 mg/dL 94  96    BUN 8 - 23 mg/dL 11  10    Creatinine 0.44 - 1.00 mg/dL 9.56  2.13  0.86   Sodium 135 - 145 mmol/L 140  140    Potassium 3.5 - 5.1 mmol/L 3.8  4.1    Chloride 98 - 111 mmol/L 106  106    CO2 22 - 32 mmol/L 26  24    Calcium 8.9 - 10.3 mg/dL 8.6  9.1    Total Protein 6.5 - 8.1 g/dL 5.8  6.5    Total Bilirubin 0.0 - 1.2 mg/dL 0.7  0.5    Alkaline Phos 38 - 126 U/L 57  56    AST 15 - 41 U/L 21  24    ALT 0 - 44 U/L 5  <5      Scheduled Meds:  aspirin EC  81 mg Oral Daily   Carbidopa -Levodopa  ER  1 capsule Oral QID   clonazepam   0.25 mg Oral QHS   vitamin B-12  1,000 mcg Oral Daily   enoxaparin  (LOVENOX ) injection  30 mg Subcutaneous Daily   pantoprazole   40 mg Oral Daily   Pimavanserin   Tartrate  34 mg Oral QHS   venlafaxine  XR  112.5 mg Oral Q breakfast   Continuous Infusions:  Time 17  Jai-Gurmukh Estell Dillinger, MD  [facility]

## 2023-06-16 NOTE — Progress Notes (Signed)
 Mobility Specialist Progress Note:   06/16/23 0900  Mobility  Activity Ambulated with assistance to bathroom;Ambulated with assistance in hallway  Level of Assistance Minimal assist, patient does 75% or more  Assistive Device Front wheel walker  Distance Ambulated (ft) 200 ft (20+180)  Activity Response Tolerated well  Mobility Referral Yes  Mobility visit 1 Mobility  Mobility Specialist Start Time (ACUTE ONLY) [id number]  Mobility Specialist Stop Time (ACUTE ONLY) 0831  Mobility Specialist Time Calculation (min) (ACUTE ONLY) 12 min   Pt received in bed and agreeable. Ambulated to BR before entering hallway. Void successful. Asymptomatic w/ no complaints. Requiring minA and directional cues for walker management. Pt left in chair w/ NT and family present.  D'Vante Nolon Baxter Mobility Specialist Please contact via Special educational needs teacher or Rehab office at [phone]

## 2023-06-17 ENCOUNTER — Ambulatory Visit: Payer: Medicare Other | Admitting: Neurology

## 2023-06-17 DIAGNOSIS — G9340 Encephalopathy, unspecified: Secondary | ICD-10-CM | POA: Diagnosis not present

## 2023-06-17 DIAGNOSIS — G9341 Metabolic encephalopathy: Secondary | ICD-10-CM | POA: Diagnosis not present

## 2023-06-17 NOTE — Plan of Care (Signed)

## 2023-06-17 NOTE — Progress Notes (Signed)
 TRH ROUNDING NOTE DAKIA SCHIFANO ZOX:[id number]  DOB: 1940/07/04  DOA: 06/13/2023  PCP: Donnie Galea, MD  06/17/2023,10:07 AM  LOS: 0 days    Code Status: DNR   from: Home current Dispo: Unclear   83 year old white female-breast cancer in 2007 with monoclonal gammopathy--- had lumpectomy and adjuvant radiotherapy in 2012 Parkinson's disease with dyskinesia on Rytary  and amantadine  Depression  5/19 brought to emergency room for increased confusion and altered mental status--- she had been seen in the ED confusion meds were adjusted by her neurologist-noted recent fall 140 potassium 4.1/creatinine 10/0.6 LFTs normal WBC 5.0 hemoglobin 12.4 platelet 200 TSH 5.5 FT4 0.8, UA negative, RPR negative CT head stable noncontrast CT brain COPD no active disease?  COPD  Plan  Metabolic encephalopathy secondary to underlying parkinsonism Has hallucinations at times --intermittent only continuing carbidopa  ER 1 capsule 4 times daily in addition to pimavanserin  34 at bedtime For confusion may be able to use Klonopin  0.25 at bedtime--wouldn't d/c on it possible  Depression Continue Effexor  112.5 daily and outpatient follow-up with primary  Prior breast cancer 2007 Status postlumpectomy and adjuvant radiotherapy Outpatient follow-up with Dr. Liam Redhead  Abnormal TSH--subclinical hypothyroidism? Recheck TSH in about 3 weeks at baseline is not on any replacement therapy  D/w family a tthe bedside on 5/21 and 5/22  DVT prophylaxis: Lovenox   Status is: Observation The patient will require care spanning > 2 midnights and should be moved to inpatient because:   Await skilled and awaiting authorization for the same      Subjective:  More writhing like movements and discomfort Confusion remains about the same to slightly improved No fever no chills  Objective + exam Vitals:   06/16/23 1555 06/16/23 2032 06/17/23 0546 06/17/23 0717  BP: (!) 120/53 (!) 115/58 112/60 114/60  Pulse: 96 97  78 85  Resp: 19 18 18    Temp:  98.3 F (36.8 C) 98 F (36.7 C) 97.6 F (36.4 C)  TempSrc:  Oral  Oral  SpO2: 98% 96% 99% 99%  Weight:      Height:       Filed Weights   06/15/23 0427  Weight: 44 kg    Examination:  NCAT writhing like movements are worse today S1-S2 no murmur Chest is clear no wheeze Abdo menance soft No lower extremity edema Moving 4 limbs equally  Data Reviewed: reviewed   CBC    Component Value Date/Time   WBC 5.5 06/15/2023 0452   RBC 3.68 (L) 06/15/2023 0452   HGB 12.2 06/15/2023 0452   HGB 13.6 05/05/2023 0854   HGB 12.3 10/13/2016 1141   HCT 37.8 06/15/2023 0452   HCT 36.7 10/13/2016 1141   PLT 193 06/15/2023 0452   PLT 175 05/05/2023 0854   PLT 226 10/13/2016 1141   MCV 102.7 (H) 06/15/2023 0452   MCV 98.7 10/13/2016 1141   MCH 33.2 06/15/2023 0452   MCHC 32.3 06/15/2023 0452   RDW 13.7 06/15/2023 0452   RDW 14.5 10/13/2016 1141   LYMPHSABS 1.3 06/13/2023 0909   LYMPHSABS 2.1 10/13/2016 1141   MONOABS 0.2 06/13/2023 0909   MONOABS 0.3 10/13/2016 1141   EOSABS 0.2 06/13/2023 0909   EOSABS 0.5 10/13/2016 1141   BASOSABS 0.0 06/13/2023 0909   BASOSABS 0.0 10/13/2016 1141      Latest Ref Rng & Units 06/15/2023    4:52 AM 06/14/2023    5:26 AM 06/13/2023   10:20 PM  CMP  Glucose 70 - 99 mg/dL 94  96  BUN 8 - 23 mg/dL 11  10    Creatinine 5.36 - 1.00 mg/dL 6.44  0.34  7.42   Sodium 135 - 145 mmol/L 140  140    Potassium 3.5 - 5.1 mmol/L 3.8  4.1    Chloride 98 - 111 mmol/L 106  106    CO2 22 - 32 mmol/L 26  24    Calcium 8.9 - 10.3 mg/dL 8.6  9.1    Total Protein 6.5 - 8.1 g/dL 5.8  6.5    Total Bilirubin 0.0 - 1.2 mg/dL 0.7  0.5    Alkaline Phos 38 - 126 U/L 57  56    AST 15 - 41 U/L 21  24    ALT 0 - 44 U/L 5  <5      Scheduled Meds:  aspirin EC  81 mg Oral Daily   Carbidopa -Levodopa  ER  1 capsule Oral QID   clonazepam   0.25 mg Oral QHS   vitamin B-12  1,000 mcg Oral Daily   enoxaparin  (LOVENOX ) injection  30 mg  Subcutaneous Daily   pantoprazole   40 mg Oral Daily   Pimavanserin  Tartrate  34 mg Oral QHS   venlafaxine  XR  112.5 mg Oral Q breakfast   Continuous Infusions:  Time 17  Jai-Gurmukh Vann Okerlund, MD  [facility]

## 2023-06-17 NOTE — TOC Progression Note (Addendum)
 Transition of Care [facility]) - Progression Note    Patient Details  Name: [name] MRN: [id number] Date of Birth: 01/15/1941  Transition of Care [facility]) CM/SW Contact  Katrinka Parr, [place] Phone Number: 06/17/2023, 10:24 AM  Clinical Narrative:     PASRR went to Qualified Mental Health Professional evaluation. A [facility] will have to come evaluate pt in person. CSW called [facility] to check on status of eval; informed that [facility] is the agency that completes evals for them, [phone]. CSW called [facility] and is informed that they received the file on Wednesday. Informed that they have 5 business days to come and evaluate pt. CSW is unable to get any additional information. Due to holiday, [facility] has through Wednesday 5/28 to come and evaluate patient in person. [facility] cannot admit pt over the weekend. TOC will continue to follow.    1420: CSW called pt's son and updated him regarding PASRR status and timeline.   Expected Discharge Plan: Skilled Nursing Facility Barriers to Discharge: Other (must enter comment) (PASRR pending)  Expected Discharge Plan and Services       Living arrangements for the past 2 months: Single Family Home                                       Social Determinants of Health (SDOH) Interventions SDOH Screenings   Food Insecurity: No Food Insecurity (06/14/2023)  Housing: Low Risk  (06/14/2023)  Transportation Needs: No Transportation Needs (06/14/2023)  Utilities: Not At Risk (06/14/2023)  Alcohol Screen: Low Risk  (11/22/2022)  Depression (PHQ2-9): Medium Risk (11/29/2022)  Financial Resource Strain: Low Risk  (11/22/2022)  Physical Activity: Inactive (11/22/2022)  Social Connections: Socially Isolated (06/14/2023)  Stress: No Stress Concern Present (11/22/2022)  Tobacco Use: Low Risk  (06/13/2023)  Health Literacy: Adequate Health Literacy (11/22/2022)    Readmission Risk Interventions     No data to display

## 2023-06-17 NOTE — Progress Notes (Signed)
 Mobility Specialist Progress Note:    06/17/23 0900  Mobility  Activity Ambulated with assistance in hallway  Level of Assistance Contact guard assist, steadying assist  Assistive Device Front wheel walker  Distance Ambulated (ft) 200 ft  Activity Response Tolerated well  Mobility Referral Yes  Mobility visit 1 Mobility  Mobility Specialist Start Time (ACUTE ONLY) 0807  Mobility Specialist Stop Time (ACUTE ONLY) 0817  Mobility Specialist Time Calculation (min) (ACUTE ONLY) 10 min   Received pt in bed having no complaints and agreeable to mobility. Pt was asymptomatic throughout ambulation and returned to room w/o fault. Left in chair w/ call bell in reach and all needs met. Chair alarm on.   D'Vante Nolon Baxter Mobility Specialist Please contact via Special educational needs teacher or Rehab office at [phone]

## 2023-06-17 NOTE — Progress Notes (Signed)
 Physical Therapy Treatment Patient Details Name: [name] MRN: [id number] DOB: 1940/11/14 Today's Date: 06/17/2023   History of Present Illness Pt is 83 yo female who presents on 06/13/23 with increased hallucinations over 2 weeks per son. PMH: Parkinsons, breast cancer, HLD, vertical diplopia, arthritis, monoclonal gammopathy    PT Comments  Pt received in chair, agreeable to therapy session and with good participation and tolerance for transfer, gait and stair negotiation. Pt performed Dynamic Gait Index with score of 13/24. Scores of 19 or less are predictive of falls in older community living adults. Pt with noted decreased L awareness, per her son she has a "lazy eye" which may impact vision/depth perception. Pt needing up to minA for gait with and without RW due to decreased L awareness and tendency to veer to L side of hallway slowly. Son reports she uses RW "some of the time" but when not supervised, she tends not to use it, although MD recommends she use it for all mobility. SpO2 inconsistent reading, pt without c/o dyspnea but while standing pt with flexed trunk posture and SpO2 reading 86-88% with static standing, RN notified. SpO2 reading improved to 88-92% on RA when pt cued to take seated break. Patient will benefit from continued inpatient follow up therapy, <3 hours/day, she only reports mild fatigue and able to perform multiple bouts of gait/stair training as well as reciprocal STS with only short recovery breaks, indicating good potential for rehab with improving activity tolerance.   If plan is discharge home, recommend the following: A little help with walking and/or transfers;A little help with bathing/dressing/bathroom;Assistance with cooking/housework;Direct supervision/assist for medications management;Direct supervision/assist for financial management;Assist for transportation;Help with stairs or ramp for entrance;Supervision due to cognitive status   Can travel by  private vehicle     Yes  Equipment Recommendations  None recommended by PT    Recommendations for Other Services       Precautions / Restrictions Precautions Precautions: Fall Recall of Precautions/Restrictions: Impaired Precaution/Restrictions Comments: is able to verbalize that she has fallen a lot. Dil reports that she frequently falls bkwds like a tree Restrictions Weight Bearing Restrictions Per Provider Order: No     Mobility  Bed Mobility Overal bed mobility: Needs Assistance             General bed mobility comments: OOB in recliner    Transfers Overall transfer level: Needs assistance Equipment used: Rolling walker (2 wheels), None Transfers: Sit to/from Stand Sit to Stand: Min assist, Contact guard assist           General transfer comment: CGA standing when using arms, when pt cued to stand from chair x10 with arms crossed at chest and not to lean legs against chair for stability, pt needing up to minA due to posterior instability.  Decreased eccentric control to sit when arms crossed at her chest    Ambulation/Gait Ambulation/Gait assistance: Min assist, Contact guard assist Gait Distance (Feet): 75 Feet (x5 with breaks, some seated some standing) Assistive device: Rolling walker (2 wheels), None Gait Pattern/deviations: Decreased step length - right, Decreased step length - left, Decreased stride length, Narrow base of support, Drifts right/left Gait velocity: decreased     General Gait Details: fair cadence with RW and CGA, but pt tending to veer to her L side and when turning to her R, pt picking RW up off the floor, with L hand lifting L side of RW more in a tilt toward the R off the floor. Pt and  son instructed to have her push RW "through the floor" while turning to avoid this tendency for pt safety and constant visual scanning to L/R to avoid bumping objects on her L. See DGI for further info on balance. last 55ft was with no AD and intermittent  HHA.   Stairs Stairs: Yes Stairs assistance: Min assist, Contact guard assist Stair Management: One rail Right, Alternating pattern, Backwards, Forwards Number of Stairs: 3 (x2 with standing break) General stair comments: ascending with R rail and descending backward, pt cued to perform only 3 steps per DGI but pt agreeable to perform x2 trials for BLE strengthening   Wheelchair Mobility     Tilt Bed    Modified Rankin (Stroke Patients Only)       Balance Overall balance assessment: Needs assistance, History of Falls Sitting-balance support: No upper extremity supported, Feet supported Sitting balance-Leahy Scale: Good Sitting balance - Comments: seated on recliner without back support Postural control: Posterior lean Standing balance support: Bilateral upper extremity supported, No upper extremity supported, During functional activity Standing balance-Leahy Scale: Poor Standing balance comment: CGA to minA when unsupported                 Standardized Balance Assessment Standardized Balance Assessment : Dynamic Gait Index   Dynamic Gait Index Level Surface: Moderate Impairment Change in Gait Speed: Mild Impairment Gait with Horizontal Head Turns: Mild Impairment Gait with Vertical Head Turns: Mild Impairment Gait and Pivot Turn: Mild Impairment Step Over Obstacle: Moderate Impairment Step Around Obstacles: Moderate Impairment Steps: Mild Impairment Total Score: 13      Communication Communication Communication: No apparent difficulties  Cognition Arousal: Alert Behavior During Therapy: Flat affect   PT - Cognitive impairments: Safety/Judgement                       PT - Cognition Comments: Pt appears A&O, calm and cooperative, pt son present and encouraging. Pt encouraged to work on scanning to L and R sides constantly while mobilizing due to decreased L sided visual/spatial awareness. Following commands: Impaired Following commands impaired:  Follows one step commands with increased time    Cueing Cueing Techniques: Verbal cues, Gestural cues  Exercises Other Exercises Other Exercises: STS x 5 reps with BUE crossed at chest, initially with BLE supported posterior by chair frame. Pt instructed to scoot forward and attempt not to use chair frame for BLE support and performed x5 reps more reciprocal with arms crossed at chest and increased to minA support needed for stability and decreased eccentric control to sit.    General Comments General comments (skin integrity, edema, etc.): Pt's son Rosalva Comber present; pt reports only mild fatigue, <5/10 modified RPE at end of session; difficulty wtih signal during standing/exercise pulse ox monitoring, reading 86-88% on RA standing, when seated SpO2 reading 88-92%, tried both hands to compare, (RN notified, pt also has nail polish on fingers)      Pertinent Vitals/Pain Pain Assessment Pain Assessment: No/denies pain    Home Living                          Prior Function            PT Goals (current goals can now be found in the care plan section) Acute Rehab PT Goals Patient Stated Goal: return home with son PT Goal Formulation: With patient/family Time For Goal Achievement: 06/28/23 Progress towards PT goals: Progressing toward goals  Frequency    Min 2X/week      PT Plan      Co-evaluation              AM-PAC PT "6 Clicks" Mobility   Outcome Measure  Help needed turning from your back to your side while in a flat bed without using bedrails?: None Help needed moving from lying on your back to sitting on the side of a flat bed without using bedrails?: A Little Help needed moving to and from a bed to a chair (including a wheelchair)?: A Little Help needed standing up from a chair using your arms (e.g., wheelchair or bedside chair)?: A Little Help needed to walk in hospital room?: A Little Help needed climbing 3-5 steps with a railing? : A Little 6 Click  Score: 19    End of Session Equipment Utilized During Treatment: Gait belt Activity Tolerance: Patient tolerated treatment well Patient left: in chair;with call bell/phone within reach;with chair alarm set;with family/visitor present (son in room) Nurse Communication: Mobility status;Other (comment) (SpO2 poor signal at times reading low) PT Visit Diagnosis: Unsteadiness on feet (R26.81);Repeated falls (R29.6);Difficulty in walking, not elsewhere classified (R26.2)     Time: 6213-0865 PT Time Calculation (min) (ACUTE ONLY): 23 min  Charges:    $Gait Training: 8-22 mins $Therapeutic Exercise: 8-22 mins PT General Charges $$ ACUTE PT VISIT: 1 Visit                     Esmerelda Finnigan P., PTA Acute Rehabilitation Services Secure Chat Preferred 9a-5:30pm Office: [phone]    Mariel Shope [facility] 06/17/2023, 11:31 AM

## 2023-06-18 DIAGNOSIS — G9341 Metabolic encephalopathy: Secondary | ICD-10-CM | POA: Diagnosis not present

## 2023-06-18 DIAGNOSIS — G9340 Encephalopathy, unspecified: Secondary | ICD-10-CM | POA: Diagnosis not present

## 2023-06-18 NOTE — Plan of Care (Signed)

## 2023-06-18 NOTE — Progress Notes (Signed)
 [facility] ROUNDING NOTE ERNIE SAGRERO [id number]:[id number]  DOB: 03-29-1940  DOA: 06/13/2023  PCP: Donnie Galea, MD  06/18/2023,1:51 PM  LOS: 0 days    Code Status: DNR   from: Home current Dispo: Unclear   83 year old white female-breast cancer in 2007 with monoclonal gammopathy--- had lumpectomy and adjuvant radiotherapy in 2012 Parkinson's disease with dyskinesia on Rytary  and amantadine  Depression  5/19 brought to emergency room for increased confusion and altered mental status--- she had been seen in the ED confusion meds were adjusted by her neurologist-noted recent fall 140 potassium 4.1/creatinine 10/0.6 LFTs normal WBC 5.0 hemoglobin 12.4 platelet 200 TSH 5.5 FT4 0.8, UA negative, RPR negative CT head stable noncontrast CT brain COPD no active disease?  COPD  Plan  Metabolic encephalopathy secondary to underlying parkinsonism Has hallucinations at times --intermittent only continue carbidopa  ER 1 capsule 4 times daily in addition to pimavanserin  34 at bedtime use Klonopin  0.25 at bedtime--wouldn't d/c on it possible  Depression Continue Effexor  112.5 daily and outpatient follow-up with primary  Prior breast cancer 2007 Status postlumpectomy and adjuvant radiotherapy Outpatient follow-up with Dr. Liam Redhead  Abnormal TSH--subclinical hypothyroidism? Recheck TSH in about 3 weeks at baseline is not on any replacement therapy  D/w family a tthe bedside on 5/21 and 5/22  DVT prophylaxis: Lovenox   Status is: Observation The patient will require care spanning > 2 midnights and should be moved to inpatient because:   Await skilled and awaiting authorization for the same   Subjective:  No specific changes other than the writhing motions and tremors are significantly increased She is visiting with her sister today  Objective + exam Vitals:   06/17/23 2003 06/18/23 0436 06/18/23 0857 06/18/23 1002  BP: (!) 105/53 133/71 (!) 110/46 118/69  Pulse: 83 84 87 69  Resp: 20 (!) 21 20  18   Temp: 98.1 F (36.7 C) 98.2 F (36.8 C) (!) 97.4 F (36.3 C) 98.3 F (36.8 C)  TempSrc: Oral Oral Oral Oral  SpO2: 98% 100% 97% 100%  Weight:      Height:       Filed Weights   06/15/23 0427  Weight: 44 kg    Examination:  Coherent awake alert no distress EOMI NCAT no focal deficit but does have increase in her tremors and writhing motion Chest clear Heart S1-S2 no murmur Abdominal soft no rebound No lower extremity edema  Data Reviewed: reviewed   CBC    Component Value Date/Time   WBC 5.5 06/15/2023 0452   RBC 3.68 (L) 06/15/2023 0452   HGB 12.2 06/15/2023 0452   HGB 13.6 05/05/2023 0854   HGB 12.3 10/13/2016 1141   HCT 37.8 06/15/2023 0452   HCT 36.7 10/13/2016 1141   PLT 193 06/15/2023 0452   PLT 175 05/05/2023 0854   PLT 226 10/13/2016 1141   MCV 102.7 (H) 06/15/2023 0452   MCV 98.7 10/13/2016 1141   MCH 33.2 06/15/2023 0452   MCHC 32.3 06/15/2023 0452   RDW 13.7 06/15/2023 0452   RDW 14.5 10/13/2016 1141   LYMPHSABS 1.3 06/13/2023 0909   LYMPHSABS 2.1 10/13/2016 1141   MONOABS 0.2 06/13/2023 0909   MONOABS 0.3 10/13/2016 1141   EOSABS 0.2 06/13/2023 0909   EOSABS 0.5 10/13/2016 1141   BASOSABS 0.0 06/13/2023 0909   BASOSABS 0.0 10/13/2016 1141      Latest Ref Rng & Units 06/15/2023    4:52 AM 06/14/2023    5:26 AM 06/13/2023   10:20 PM  CMP  Glucose 70 -  99 mg/dL 94  96    BUN 8 - 23 mg/dL 11  10    Creatinine 5.62 - 1.00 mg/dL 1.30  8.65  7.84   Sodium 135 - 145 mmol/L 140  140    Potassium 3.5 - 5.1 mmol/L 3.8  4.1    Chloride 98 - 111 mmol/L 106  106    CO2 22 - 32 mmol/L 26  24    Calcium 8.9 - 10.3 mg/dL 8.6  9.1    Total Protein 6.5 - 8.1 g/dL 5.8  6.5    Total Bilirubin 0.0 - 1.2 mg/dL 0.7  0.5    Alkaline Phos 38 - 126 U/L 57  56    AST 15 - 41 U/L 21  24    ALT 0 - 44 U/L 5  <5      Scheduled Meds:  aspirin EC  81 mg Oral Daily   Carbidopa -Levodopa  ER  1 capsule Oral QID   clonazepam   0.25 mg Oral QHS   vitamin B-12  1,000  mcg Oral Daily   enoxaparin  (LOVENOX ) injection  30 mg Subcutaneous Daily   pantoprazole   40 mg Oral Daily   Pimavanserin  Tartrate  34 mg Oral QHS   venlafaxine  XR  112.5 mg Oral Q breakfast   Continuous Infusions:  Time 17  Jai-Gurmukh Lillyen Schow, MD  [facility]

## 2023-06-18 NOTE — Progress Notes (Signed)
 Mobility Specialist: Progress Note   06/18/23 1103  Mobility  Activity Ambulated with assistance in hallway  Level of Assistance Contact guard assist, steadying assist  Assistive Device Front wheel walker  Distance Ambulated (ft) 400 ft  Activity Response Tolerated well  Mobility Referral Yes  Mobility visit 1 Mobility  Mobility Specialist Start Time (ACUTE ONLY) 0910  Mobility Specialist Stop Time (ACUTE ONLY) [id number]  Mobility Specialist Time Calculation (min) (ACUTE ONLY) 11 min    Received pt in bed having no complaints and agreeable to mobility. Pt was asymptomatic throughout ambulation and returned to room w/o fault. Left in chair w/ call bell in reach and all needs met.   Deloria Fetch Mobility Specialist Please contact via SecureChat or Rehab office at [phone]

## 2023-06-18 NOTE — Plan of Care (Signed)
  Problem: Education: Goal: Knowledge of General Education information will improve Description: Including pain rating scale, medication(s)/side effects and non-pharmacologic comfort measures 06/18/2023 0106 by Armin Landing, RN Outcome: Completed/Met 06/18/2023 0105 by Armin Landing, RN Outcome: Progressing

## 2023-06-19 DIAGNOSIS — G9340 Encephalopathy, unspecified: Secondary | ICD-10-CM | POA: Diagnosis not present

## 2023-06-19 DIAGNOSIS — G9341 Metabolic encephalopathy: Secondary | ICD-10-CM | POA: Diagnosis not present

## 2023-06-19 LAB — GLUCOSE, CAPILLARY
Glucose-Capillary: 269 mg/dL — ABNORMAL HIGH (ref 70–99)
Glucose-Capillary: 210 mg/dL — ABNORMAL HIGH (ref 70–99)
Glucose-Capillary: 96 mg/dL (ref 70–99)

## 2023-06-19 NOTE — Progress Notes (Signed)
 Mobility Specialist: Progress Note   06/19/23 1520  Mobility  Activity Transferred to/from [facility]  Level of Assistance Minimal assist, patient does 75% or more  Assistive Device Front wheel walker  Activity Response Tolerated well  Mobility Referral Yes  Mobility visit 1 Mobility  Mobility Specialist Start Time (ACUTE ONLY) 1457  Mobility Specialist Stop Time (ACUTE ONLY) 1504  Mobility Specialist Time Calculation (min) (ACUTE ONLY) 7 min    Pt received on BSC. MinA for stand pivot to chair. Required minA for pericare from MS. Left in chair with all needs met, call bell in reach. Sister and visitor present.    Deloria Fetch Mobility Specialist Please contact via SecureChat or Rehab office at [phone]

## 2023-06-19 NOTE — Progress Notes (Addendum)
 Mobility Specialist: Progress Note   06/19/23 1514  Mobility  Activity Ambulated with assistance in hallway  Level of Assistance Contact guard assist, steadying assist  Assistive Device Front wheel walker  Distance Ambulated (ft) 400 ft  Activity Response Tolerated well  Mobility Referral Yes  Mobility visit 1 Mobility  Mobility Specialist Start Time (ACUTE ONLY) 1004  Mobility Specialist Stop Time (ACUTE ONLY) 1014  Mobility Specialist Time Calculation (min) (ACUTE ONLY) 10 min    Pt received in bed, agreeable to mobility session. MinG mostly, but required minA for steadying and RW steering d/t a heavy L drift. Returned to room withotu fault. Left in chair with all needs met, call bell in reach. Chair alarm on.   Deloria Fetch Mobility Specialist Please contact via SecureChat or Rehab office at [phone]

## 2023-06-19 NOTE — Progress Notes (Signed)
 [facility] ROUNDING NOTE [name] [id number]  DOB: 04/20/1940  DOA: 06/13/2023  PCP: Donnie Galea, MD  06/19/2023,10:08 AM  LOS: 0 days    Code Status: DNR   from: Home current Dispo: Unclear   83 year old white female-breast cancer in 2007 with monoclonal gammopathy--- had lumpectomy and adjuvant radiotherapy in 2012 Parkinson's disease with dyskinesia on Rytary  and amantadine  Depression  5/19 brought to emergency room for increased confusion and altered mental status--- she had been seen in the ED confusion meds were adjusted by her neurologist-noted recent fall 140 potassium 4.1/creatinine 10/0.6 LFTs normal WBC 5.0 hemoglobin 12.4 platelet 200 TSH 5.5 FT4 0.8, UA negative, RPR negative CT head stable noncontrast CT brain COPD no active disease?  COPD  Plan  Metabolic encephalopathy secondary to underlying parkinsonism This is intermittent but is stable and has not had further issues continue carbidopa  ER 1 capsule 4 times daily in addition to pimavanserin  34 at bedtime use Klonopin  0.25 at bedtime--wouldn't d/c on it possible  Depression Continue Effexor  112.5 daily outpatient titration  Prior breast cancer 2007 Status postlumpectomy and adjuvant radiotherapy Outpatient follow-up with Dr. Liam Redhead  Abnormal TSH--subclinical hypothyroidism? Recheck TSH in about 3 weeks at baseline is not on any replacement therapy  D/w family a tthe bedside on 5/21 and 5/22  DVT prophylaxis: Lovenox   Status is: Observation The patient will require care spanning > 2 midnights and should be moved to inpatient because:   Await skilled and awaiting authorization for the same   Subjective:  Seen this morning no distress having less writhing movements but looks good overall No other complaints  Objective + exam Vitals:   06/18/23 1714 06/18/23 2104 06/19/23 0442 06/19/23 0928  BP: 131/66 126/70 (!) 125/56 (!) 112/56  Pulse: 91 85 76 84  Resp: 16 18 16 17   Temp: 98.6 F (37 C)  97.6 F (36.4 C) 98.1 F (36.7 C) 98.2 F (36.8 C)  TempSrc: Oral Oral Oral Oral  SpO2: 100% 100% 100% 94%  Weight:      Height:       Filed Weights   06/15/23 0427  Weight: 44 kg    Examination:  Coherent awake chest is clear no wheeze rales rhonchi Abdominal soft S1-S2 no murmur  Motor is about the same she has less writhing movements today  Data Reviewed: reviewed   CBC    Component Value Date/Time   WBC 5.5 06/15/2023 0452   RBC 3.68 (L) 06/15/2023 0452   HGB 12.2 06/15/2023 0452   HGB 13.6 05/05/2023 0854   HGB 12.3 10/13/2016 1141   HCT 37.8 06/15/2023 0452   HCT 36.7 10/13/2016 1141   PLT 193 06/15/2023 0452   PLT 175 05/05/2023 0854   PLT 226 10/13/2016 1141   MCV 102.7 (H) 06/15/2023 0452   MCV 98.7 10/13/2016 1141   MCH 33.2 06/15/2023 0452   MCHC 32.3 06/15/2023 0452   RDW 13.7 06/15/2023 0452   RDW 14.5 10/13/2016 1141   LYMPHSABS 1.3 06/13/2023 0909   LYMPHSABS 2.1 10/13/2016 1141   MONOABS 0.2 06/13/2023 0909   MONOABS 0.3 10/13/2016 1141   EOSABS 0.2 06/13/2023 0909   EOSABS 0.5 10/13/2016 1141   BASOSABS 0.0 06/13/2023 0909   BASOSABS 0.0 10/13/2016 1141      Latest Ref Rng & Units 06/15/2023    4:52 AM 06/14/2023    5:26 AM 06/13/2023   10:20 PM  CMP  Glucose 70 - 99 mg/dL 94  96    BUN 8 -  23 mg/dL 11  10    Creatinine 4.09 - 1.00 mg/dL 8.11  9.14  7.82   Sodium 135 - 145 mmol/L 140  140    Potassium 3.5 - 5.1 mmol/L 3.8  4.1    Chloride 98 - 111 mmol/L 106  106    CO2 22 - 32 mmol/L 26  24    Calcium 8.9 - 10.3 mg/dL 8.6  9.1    Total Protein 6.5 - 8.1 g/dL 5.8  6.5    Total Bilirubin 0.0 - 1.2 mg/dL 0.7  0.5    Alkaline Phos 38 - 126 U/L 57  56    AST 15 - 41 U/L 21  24    ALT 0 - 44 U/L 5  <5      Scheduled Meds:  aspirin EC  81 mg Oral Daily   Carbidopa -Levodopa  ER  1 capsule Oral QID   clonazepam   0.25 mg Oral QHS   vitamin B-12  1,000 mcg Oral Daily   enoxaparin  (LOVENOX ) injection  30 mg Subcutaneous Daily    pantoprazole   40 mg Oral Daily   Pimavanserin  Tartrate  34 mg Oral QHS   venlafaxine  XR  112.5 mg Oral Q breakfast   Continuous Infusions:  Time 17  Jai-Gurmukh Jonquil Stubbe, MD  [facility]

## 2023-06-19 NOTE — Progress Notes (Signed)
 Mobility Specialist: Progress Note   06/19/23 1517  Mobility  Activity Transferred to/from [facility]  Level of Assistance Minimal assist, patient does 75% or more  Assistive Device Front wheel walker  Activity Response Tolerated well  Mobility Referral Yes  Mobility visit 1 Mobility  Mobility Specialist Start Time (ACUTE ONLY) 1446  Mobility Specialist Stop Time (ACUTE ONLY) 1450  Mobility Specialist Time Calculation (min) (ACUTE ONLY) 4 min    Pt requesting to use BSC. MinA for stand pivot transfer. Left on [facility] with all needs met, call bell in reach. Sister present.   Deloria Fetch Mobility Specialist Please contact via SecureChat or Rehab office at [phone]

## 2023-06-19 NOTE — Progress Notes (Signed)
 Blood sugars of 210 and 269 both taken this am are incorrect and were documented on the wrong pt.

## 2023-06-19 NOTE — Plan of Care (Signed)

## 2023-06-20 DIAGNOSIS — G9341 Metabolic encephalopathy: Secondary | ICD-10-CM | POA: Diagnosis not present

## 2023-06-20 DIAGNOSIS — G9340 Encephalopathy, unspecified: Secondary | ICD-10-CM | POA: Diagnosis not present

## 2023-06-20 LAB — CBC WITH DIFFERENTIAL/PLATELET
Abs Immature Granulocytes: 0.01 10*3/uL (ref 0.00–0.07)
Basophils Absolute: 0 10*3/uL (ref 0.0–0.1)
Basophils Relative: 1 %
Eosinophils Absolute: 0.3 10*3/uL (ref 0.0–0.5)
Eosinophils Relative: 8 %
HCT: 36.9 % (ref 36.0–46.0)
Hemoglobin: 11.8 g/dL — ABNORMAL LOW (ref 12.0–15.0)
Immature Granulocytes: 0 %
Lymphocytes Relative: 40 %
Lymphs Abs: 1.7 10*3/uL (ref 0.7–4.0)
MCH: 33.1 pg (ref 26.0–34.0)
MCHC: 32 g/dL (ref 30.0–36.0)
MCV: 103.7 fL — ABNORMAL HIGH (ref 80.0–100.0)
Monocytes Absolute: 0.3 10*3/uL (ref 0.1–1.0)
Monocytes Relative: 8 %
Neutro Abs: 1.8 10*3/uL (ref 1.7–7.7)
Neutrophils Relative %: 43 %
Platelets: 193 10*3/uL (ref 150–400)
RBC: 3.56 MIL/uL — ABNORMAL LOW (ref 3.87–5.11)
RDW: 13.3 % (ref 11.5–15.5)
WBC: 4.2 10*3/uL (ref 4.0–10.5)
nRBC: 0 % (ref 0.0–0.2)

## 2023-06-20 LAB — BASIC METABOLIC PANEL WITH GFR
Anion gap: 9 (ref 5–15)
BUN: 17 mg/dL (ref 8–23)
CO2: 26 mmol/L (ref 22–32)
Calcium: 8.5 mg/dL — ABNORMAL LOW (ref 8.9–10.3)
Chloride: 106 mmol/L (ref 98–111)
Creatinine, Ser: 0.62 mg/dL (ref 0.44–1.00)
GFR, Estimated: >60 mL/min (ref >60)
Glucose, Bld: 95 mg/dL (ref 70–99)
Potassium: 4 mmol/L (ref 3.5–5.1)
Sodium: 141 mmol/L (ref 135–145)

## 2023-06-20 MED ORDER — ORAL CARE MOUTH RINSE: 15.0000 mL | OROMUCOSAL | Status: DC | PRN

## 2023-06-20 NOTE — Progress Notes (Signed)
 [facility] ROUNDING NOTE KEYLIN FERRYMAN [id number]  DOB: [birth year]  DOA: 06/13/2023  PCP: Donnie Galea, MD  06/20/2023,10:53 AM  LOS: 0 days    Code Status: DNR   from: Home current Dispo: Unclear   83 year old white female-breast cancer in 2007 with monoclonal gammopathy--- had lumpectomy and adjuvant radiotherapy in 2012 Parkinson's disease with dyskinesia on Rytary  and amantadine  Depression  5/19 brought to emergency room for increased confusion and altered mental status--- she had been seen in the ED confusion meds were adjusted by her neurologist-noted recent fall 140 potassium 4.1/creatinine 10/0.6 LFTs normal WBC 5.0 hemoglobin 12.4 platelet 200 TSH 5.5 FT4 0.8, UA negative, RPR negative CT head stable noncontrast CT brain COPD no active disease?  COPD  Plan  Metabolic encephalopathy secondary to underlying parkinsonism This is intermittent but patient has overall not had significant issues during hospital stay continue carbidopa  ER 1 capsule 4 times daily in addition to pimavanserin  34 at bedtime use Klonopin  0.25 at bedtime--use only periodically if really needed for significant hallucinations  Depression Continue Effexor  112.5 daily outpatient titration  Prior breast cancer 2007 Status postlumpectomy and adjuvant radiotherapy Outpatient follow-up with Dr. Liam Redhead  Abnormal TSH--subclinical hypothyroidism? Recheck TSH in about 2 weeks at baseline is not on any replacement therapy  D/w family a tthe bedside on 5/21 and 5/22  DVT prophylaxis: Lovenox   Status is: Observation The patient will require care spanning > 2 midnights and should be moved to inpatient because:   Await skilled and awaiting authorization for the same   Subjective:  No distress sitting out of bed seems comfortable in chair no chest pain  Objective + exam Vitals:   06/19/23 1730 06/19/23 2158 06/20/23 0613 06/20/23 0934  BP: 109/63 (!) 118/54 (!) 116/59 (!) 100/52  Pulse: 86 79 69 82   Resp:  16 18 18   Temp:  97.8 F (36.6 C) (!) 97.4 F (36.3 C) 98.1 F (36.7 C)  TempSrc:  Oral  Oral  SpO2: 100% 97% 97% 99%  Weight:      Height:       Filed Weights   06/15/23 0427  Weight: 44 kg    Examination:  Less writhing motion no distress no fever no chills no nausea no vomiting S1-S2 no murmur no rub no gallop Chest is clear no added sound no rales no rhonchi ROM is intact Power is 5/5 Abdominal is soft no rebound  Data Reviewed: reviewed   CBC    Component Value Date/Time   WBC 4.2 06/20/2023 0517   RBC 3.56 (L) 06/20/2023 0517   HGB 11.8 (L) 06/20/2023 0517   HGB 13.6 05/05/2023 0854   HGB 12.3 10/13/2016 1141   HCT 36.9 06/20/2023 0517   HCT 36.7 10/13/2016 1141   PLT 193 06/20/2023 0517   PLT 175 05/05/2023 0854   PLT 226 10/13/2016 1141   MCV 103.7 (H) 06/20/2023 0517   MCV 98.7 10/13/2016 1141   MCH 33.1 06/20/2023 0517   MCHC 32.0 06/20/2023 0517   RDW 13.3 06/20/2023 0517   RDW 14.5 10/13/2016 1141   LYMPHSABS 1.7 06/20/2023 0517   LYMPHSABS 2.1 10/13/2016 1141   MONOABS 0.3 06/20/2023 0517   MONOABS 0.3 10/13/2016 1141   EOSABS 0.3 06/20/2023 0517   EOSABS 0.5 10/13/2016 1141   BASOSABS 0.0 06/20/2023 0517   BASOSABS 0.0 10/13/2016 1141      Latest Ref Rng & Units 06/20/2023    5:17 AM 06/15/2023    4:52 AM 06/14/2023  5:26 AM  CMP  Glucose 70 - 99 mg/dL 95  94  96   BUN 8 - 23 mg/dL 17  11  10    Creatinine 0.44 - 1.00 mg/dL 4.09  8.11  9.14   Sodium 135 - 145 mmol/L 141  140  140   Potassium 3.5 - 5.1 mmol/L 4.0  3.8  4.1   Chloride 98 - 111 mmol/L 106  106  106   CO2 22 - 32 mmol/L 26  26  24    Calcium 8.9 - 10.3 mg/dL 8.5  8.6  9.1   Total Protein 6.5 - 8.1 g/dL  5.8  6.5   Total Bilirubin 0.0 - 1.2 mg/dL  0.7  0.5   Alkaline Phos 38 - 126 U/L  57  56   AST 15 - 41 U/L  21  24   ALT 0 - 44 U/L  5  <5     Scheduled Meds:  aspirin EC  81 mg Oral Daily   Carbidopa -Levodopa  ER  1 capsule Oral QID   clonazepam   0.25 mg  Oral QHS   vitamin B-12  1,000 mcg Oral Daily   enoxaparin  (LOVENOX ) injection  30 mg Subcutaneous Daily   pantoprazole   40 mg Oral Daily   Pimavanserin  Tartrate  34 mg Oral QHS   venlafaxine  XR  112.5 mg Oral Q breakfast   Continuous Infusions:  Time 17  Jai-Gurmukh Jiyan Walkowski, MD  [facility]

## 2023-06-20 NOTE — Progress Notes (Signed)
 Mobility Specialist Progress Note:    06/20/23 0800  Mobility  Activity Ambulated with assistance in hallway  Level of Assistance Contact guard assist, steadying assist  Assistive Device Front wheel walker  Distance Ambulated (ft) 400 ft  Activity Response Tolerated well  Mobility Referral Yes  Mobility visit 1 Mobility  Mobility Specialist Start Time (ACUTE ONLY) [id number]  Mobility Specialist Stop Time (ACUTE ONLY) 0840  Mobility Specialist Time Calculation (min) (ACUTE ONLY) 19 min   Pt received in bed and agreeable. Voided in BR before hallway ambulation. Asymptomatic w/ no complaints throughout. Pt left in chair with call bell and all needs met. Chair alarm on.  D'Vante Nolon Baxter Mobility Specialist Please contact via Special educational needs teacher or Rehab office at [phone]

## 2023-06-20 NOTE — Progress Notes (Signed)
 Physical Therapy Treatment Patient Details Name: [name] MRN: [id number] DOB: [birth year] Today's Date: 06/20/2023   History of Present Illness Pt is 83 yo female who presents on 06/13/23 with increased hallucinations over 2 weeks per son. PMH: Parkinsons, breast cancer, HLD, vertical diplopia, arthritis, monoclonal gammopathy    PT Comments  Pt received in recliner, sister present and son present end of session. Pt finishing up lunch independently. Pt able to verbalize that she needs to use RW for safety, however, family states that at home she often forgets. Needed cues to remember proper hand placement. Pt ambulated 450' with RW and CGA, continues to hit obstacles on L side, cues needed to scan environment. Pt ascended and descended 6 stairs with rail for strengthening. Exertion brought on tremor that continued until pt sat down and LE's elevated. VSS. Patient will benefit from continued inpatient follow up therapy, <3 hours/day. PT will continue to follow.     If plan is discharge home, recommend the following: A little help with walking and/or transfers;A little help with bathing/dressing/bathroom;Assistance with cooking/housework;Direct supervision/assist for medications management;Direct supervision/assist for financial management;Assist for transportation;Help with stairs or ramp for entrance;Supervision due to cognitive status   Can travel by private vehicle     Yes  Equipment Recommendations  None recommended by PT    Recommendations for Other Services       Precautions / Restrictions Precautions Precautions: Fall Recall of Precautions/Restrictions: Impaired Precaution/Restrictions Comments: pt states that she falls a lot but still does not always remember to keep RW with her Restrictions Weight Bearing Restrictions Per Provider Order: No     Mobility  Bed Mobility               General bed mobility comments: OOB in recliner    Transfers Overall transfer  level: Needs assistance Equipment used: Rolling walker (2 wheels) Transfers: Sit to/from Stand Sit to Stand: Contact guard assist           General transfer comment: CGA from recliner, mutliple times for strength. Needed reminder to properly place L hand on RW, decreased attention to that side    Ambulation/Gait Ambulation/Gait assistance: Min assist, Contact guard assist Gait Distance (Feet): 450 Feet Assistive device: Rolling walker (2 wheels) Gait Pattern/deviations: Decreased step length - right, Decreased step length - left, Decreased stride length, Narrow base of support, Drifts right/left Gait velocity: decreased Gait velocity interpretation: 1.31 - 2.62 ft/sec, indicative of limited community ambulator   General Gait Details: continues to bump objects on L side, needs vc's to correct. Increased pace with distance   Stairs Stairs: Yes Stairs assistance: Contact guard assist Stair Management: One rail Right, Alternating pattern, Forwards, Step to pattern Number of Stairs: 6 General stair comments: 6 steps with alternating pattern for ascension, step to with LLE leading for descent due to R knee OA. No LOB with use of rail. After exertion pt began having tremor L hand, RLE. Continued to shake until pt sat and LE's elevated.   Wheelchair Mobility     Tilt Bed    Modified Rankin (Stroke Patients Only)       Balance Overall balance assessment: Needs assistance, History of Falls Sitting-balance support: No upper extremity supported, Feet supported Sitting balance-Leahy Scale: Good Sitting balance - Comments: seated on recliner without back support Postural control: Posterior lean Standing balance support: Bilateral upper extremity supported, No upper extremity supported, During functional activity Standing balance-Leahy Scale: Poor Standing balance comment: CGA to minA when unsupported  Communication  Communication Communication: No apparent difficulties  Cognition Arousal: Alert Behavior During Therapy: Flat affect   PT - Cognitive impairments: Safety/Judgement                       PT - Cognition Comments: STM deficits. Needs cues to remember to scan environment during mobility Following commands: Impaired Following commands impaired: Follows one step commands with increased time    Cueing Cueing Techniques: Verbal cues, Gestural cues  Exercises General Exercises - Lower Extremity Long Arc Quad: AROM, Both, 10 reps, Seated, Other (comment), Limitations (with resistance) Long Arc Quad Limitations: RLE stronger than L Heel Slides: AROM, Both, 10 reps, Seated, Limitations, Other (comment) (with resistance) Heel Slides Limitations: RLE stronger than L    General Comments General comments (skin integrity, edema, etc.): VSS on RA. Son reports that tremors last significant portion of each day      Pertinent Vitals/Pain Pain Assessment Pain Assessment: Faces Faces Pain Scale: Hurts little more Pain Location: R knee (OA) Pain Descriptors / Indicators: Aching Pain Intervention(s): Limited activity within patient's tolerance, Monitored during session    Home Living                          Prior Function            PT Goals (current goals can now be found in the care plan section) Acute Rehab PT Goals Patient Stated Goal: go home PT Goal Formulation: With patient/family Time For Goal Achievement: 06/28/23 Potential to Achieve Goals: Fair Progress towards PT goals: Progressing toward goals    Frequency    Min 2X/week      PT Plan      Co-evaluation              AM-PAC PT "6 Clicks" Mobility   Outcome Measure  Help needed turning from your back to your side while in a flat bed without using bedrails?: None Help needed moving from lying on your back to sitting on the side of a flat bed without using bedrails?: A Little Help needed moving  to and from a bed to a chair (including a wheelchair)?: A Little Help needed standing up from a chair using your arms (e.g., wheelchair or bedside chair)?: A Little Help needed to walk in hospital room?: A Little Help needed climbing 3-5 steps with a railing? : A Little 6 Click Score: 19    End of Session Equipment Utilized During Treatment: Gait belt Activity Tolerance: Patient tolerated treatment well Patient left: in chair;with call bell/phone within reach;with chair alarm set;with family/visitor present Nurse Communication: Mobility status PT Visit Diagnosis: Unsteadiness on feet (R26.81);Repeated falls (R29.6);Difficulty in walking, not elsewhere classified (R26.2)     Time: 6962-9528 PT Time Calculation (min) (ACUTE ONLY): 23 min  Charges:    $Gait Training: 23-37 mins PT General Charges $$ ACUTE PT VISIT: 1 Visit                     Amey Ka, PT  Acute Rehab Services Secure chat preferred Office [phone]    Deloris Fetters Read Bonelli 06/20/2023, 2:12 PM

## 2023-06-20 NOTE — Plan of Care (Signed)
   Problem: Clinical Measurements: Goal: Ability to maintain clinical measurements within normal limits will improve Outcome: Completed/Met Goal: Will remain free from infection Outcome: Completed/Met Goal: Diagnostic test results will improve Outcome: Completed/Met Goal: Respiratory complications will improve Outcome: Completed/Met Goal: Cardiovascular complication will be avoided Outcome: Completed/Met

## 2023-06-20 NOTE — Plan of Care (Signed)

## 2023-06-21 ENCOUNTER — Other Ambulatory Visit (HOSPITAL_COMMUNITY): Payer: Self-pay

## 2023-06-21 DIAGNOSIS — G9340 Encephalopathy, unspecified: Secondary | ICD-10-CM | POA: Diagnosis not present

## 2023-06-21 DIAGNOSIS — G9341 Metabolic encephalopathy: Secondary | ICD-10-CM | POA: Diagnosis not present

## 2023-06-21 LAB — CBC WITH DIFFERENTIAL/PLATELET
Abs Immature Granulocytes: 0.04 10*3/uL (ref 0.00–0.07)
Basophils Absolute: 0.1 10*3/uL (ref 0.0–0.1)
Basophils Relative: 1 %
Eosinophils Absolute: 0.3 10*3/uL (ref 0.0–0.5)
Eosinophils Relative: 7 %
HCT: 35.9 % — ABNORMAL LOW (ref 36.0–46.0)
Hemoglobin: 11.6 g/dL — ABNORMAL LOW (ref 12.0–15.0)
Immature Granulocytes: 1 %
Lymphocytes Relative: 41 %
Lymphs Abs: 1.8 10*3/uL (ref 0.7–4.0)
MCH: 32.9 pg (ref 26.0–34.0)
MCHC: 32.3 g/dL (ref 30.0–36.0)
MCV: 101.7 fL — ABNORMAL HIGH (ref 80.0–100.0)
Monocytes Absolute: 0.4 10*3/uL (ref 0.1–1.0)
Monocytes Relative: 9 %
Neutro Abs: 1.8 10*3/uL (ref 1.7–7.7)
Neutrophils Relative %: 41 %
Platelets: 198 10*3/uL (ref 150–400)
RBC: 3.53 MIL/uL — ABNORMAL LOW (ref 3.87–5.11)
RDW: 13.4 % (ref 11.5–15.5)
WBC: 4.3 10*3/uL (ref 4.0–10.5)
nRBC: 0 % (ref 0.0–0.2)

## 2023-06-21 LAB — BASIC METABOLIC PANEL WITH GFR
Anion gap: 6 (ref 5–15)
BUN: 19 mg/dL (ref 8–23)
CO2: 29 mmol/L (ref 22–32)
Calcium: 8.8 mg/dL — ABNORMAL LOW (ref 8.9–10.3)
Chloride: 105 mmol/L (ref 98–111)
Creatinine, Ser: 0.63 mg/dL (ref 0.44–1.00)
GFR, Estimated: >60 mL/min (ref >60)
Glucose, Bld: 101 mg/dL — ABNORMAL HIGH (ref 70–99)
Potassium: 4.2 mmol/L (ref 3.5–5.1)
Sodium: 140 mmol/L (ref 135–145)

## 2023-06-21 MED ORDER — CLONAZEPAM 0.5 MG PO TABS: 0.2500 mg | ORAL_TABLET | Freq: Every day | ORAL | 1 refills | Status: DC

## 2023-06-21 MED ORDER — VENLAFAXINE HCL ER 37.5 MG PO CP24: 112.5000 mg | ORAL_CAPSULE | Freq: Every day | ORAL | 2 refills | Status: DC

## 2023-06-21 MED ORDER — ASPIRIN 81 MG PO TBEC: 81.0000 mg | DELAYED_RELEASE_TABLET | Freq: Every day | ORAL | Status: AC

## 2023-06-21 NOTE — Progress Notes (Signed)
 Mobility Specialist Progress Note:    06/21/23 1100  Mobility  Activity Ambulated with assistance in hallway  Level of Assistance Contact guard assist, steadying assist  Assistive Device Front wheel walker  Distance Ambulated (ft) 400 ft  Activity Response Tolerated well  Mobility Referral Yes  Mobility visit 1 Mobility  Mobility Specialist Start Time (ACUTE ONLY) 0818  Mobility Specialist Stop Time (ACUTE ONLY) 0831  Mobility Specialist Time Calculation (min) (ACUTE ONLY) 13 min   Received pt in bed having no complaints and agreeable to mobility. Pt was asymptomatic throughout ambulation and returned to room w/o fault. Left in chair w/ call bell in reach and all needs met.   D'Vante Nolon Baxter Mobility Specialist Please contact via Special educational needs teacher or Rehab office at [phone]

## 2023-06-21 NOTE — Progress Notes (Signed)
 Occupational Therapy Treatment Patient Details Name: [name] MRN: [id number] DOB: 1940/02/27 Today's Date: 06/21/2023   History of present illness Pt is 83 yo female who presents on 06/13/23 with increased hallucinations over 2 weeks per son. PMH: Parkinsons, breast cancer, HLD, vertical diplopia, arthritis, monoclonal gammopathy   OT comments  Pt seen for OT tx session focusing on ADL and functional mobility performance. Pt requires CGA overall for functional transfers + mobility using RW due to posterior lean, is able to complete t/f bathroom for toileting needs, MIN A for clothing management + hygiene after void. UB/LB bathing completed recliner level using sit<>stand to pull underwear over hips. Mild tremor noted RUE/LE, decreased L sided attention requiring min vcs. Pt making progress towards goals, discharge recommendation appropriate. Patient will benefit from continued inpatient follow up therapy, <3 hours/day.       If plan is discharge home, recommend the following:  A little help with walking and/or transfers;A lot of help with bathing/dressing/bathroom;Assistance with cooking/housework;Direct supervision/assist for medications management;Direct supervision/assist for financial management;Assist for transportation;Help with stairs or ramp for entrance;Supervision due to cognitive status   Equipment Recommendations  Other (comment)       Precautions / Restrictions Precautions Precautions: Fall Recall of Precautions/Restrictions: Impaired Precaution/Restrictions Comments: pt states that she falls a lot but still does not always remember to keep RW with her Restrictions Weight Bearing Restrictions Per Provider Order: No       Mobility Bed Mobility               General bed mobility comments: NT, in recliner pre and post session    Transfers Overall transfer level: Needs assistance Equipment used: Rolling walker (2 wheels) Transfers: Sit to/from Stand Sit to  Stand: Contact guard assist           General transfer comment: requires cues for hand placment and anterior weight shift due to posterior bias requiring occassional min A to correct     Balance Overall balance assessment: Needs assistance, History of Falls Sitting-balance support: No upper extremity supported, Feet supported Sitting balance-Leahy Scale: Good   Postural control: Posterior lean Standing balance support: Bilateral upper extremity supported, No upper extremity supported, During functional activity Standing balance-Leahy Scale: Poor Standing balance comment: CGA to minA when unsupported, poor awareness of deficit and ability to self-correct LOB                           ADL either performed or assessed with clinical judgement   ADL Overall ADL's : Needs assistance/impaired     Grooming: Applying deodorant;Sitting;Independent   Upper Body Bathing: Sitting;Set up Upper Body Bathing Details (indicate cue type and reason): min vcs for L sided awareness Lower Body Bathing: Minimal assistance;Sit to/from stand Lower Body Bathing Details (indicate cue type and reason): minA for standing balance, and cues for unilateral hand support on walker. cues for task segmentation and thoroughness Upper Body Dressing : Sitting;Minimal assistance   Lower Body Dressing: Minimal assistance Lower Body Dressing Details (indicate cue type and reason): doffing underwear, cues for task segmentaion, minA to coordinate pulling over feet due to mild RUE tremors Toilet Transfer: Contact guard assist;Ambulation;Rolling walker (2 wheels) Toilet Transfer Details (indicate cue type and reason): t/f bathroom, cues for AD proximity Toileting- Clothing Manipulation and Hygiene: Contact guard assist Toileting - Clothing Manipulation Details (indicate cue type and reason): cues for functional transfer - hand placement, anterior weight shift     Functional mobility  during ADLs: Contact guard  assist;Rolling walker (2 wheels) General ADL Comments: CGA for functional transfers and mobility due to posterior leaning. ADL session completed t/f bathroom and seated UB/LB bathing recliner level     Communication Communication Communication: No apparent difficulties   Cognition Arousal: Alert Behavior During Therapy: Flat affect Cognition: Cognition impaired   Orientation impairments: Situation Awareness: Intellectual awareness impaired Memory impairment (select all impairments): Short-term memory Attention impairment (select first level of impairment): Focused attention Executive functioning impairment (select all impairments): Initiation, Sequencing OT - Cognition Comments: poor ability to correct balance during functional transfers despite frequent step by step cuing, decreased L sided awareness potentially due to vision                 Following commands: Impaired Following commands impaired: Follows one step commands with increased time      Cueing   Cueing Techniques: Verbal cues, Gestural cues             Pertinent Vitals/ Pain       Pain Assessment Pain Assessment: 0-10 Pain Score: 8  Pain Location: R knee (OA) Pain Descriptors / Indicators: Aching Pain Intervention(s): Limited activity within patient's tolerance, Monitored during session, Repositioned, Patient requesting pain meds-RN notified         Frequency  Min 2X/week        Progress Toward Goals  OT Goals(current goals can now be found in the care plan section)  Progress towards OT goals: Progressing toward goals  Acute Rehab OT Goals OT Goal Formulation: With patient Time For Goal Achievement: 06/28/23 Potential to Achieve Goals: Good ADL Goals Pt Will Perform Upper Body Dressing: with modified independence;sitting Pt Will Perform Lower Body Dressing: with modified independence;sitting/lateral leans;sit to/from stand Pt Will Transfer to Toilet: with modified  independence;ambulating;regular height toilet Pt Will Perform Tub/Shower Transfer: Tub transfer;Shower transfer;with modified independence;ambulating Additional ADL Goal #1: pt will perform 3 step task with min cues in prep for ADLs  Plan         AM-PAC OT "6 Clicks" Daily Activity     Outcome Measure   Help from another person eating meals?: A Little Help from another person taking care of personal grooming?: A Little Help from another person toileting, which includes using toliet, bedpan, or urinal?: A Little Help from another person bathing (including washing, rinsing, drying)?: A Lot Help from another person to put on and taking off regular upper body clothing?: A Little Help from another person to put on and taking off regular lower body clothing?: A Lot 6 Click Score: 16    End of Session Equipment Utilized During Treatment: Gait belt;Rolling walker (2 wheels)  OT Visit Diagnosis: Muscle weakness (generalized) (M62.81);Other symptoms and signs involving cognitive function;History of falling (Z91.81);Other abnormalities of gait and mobility (R26.89);Unsteadiness on feet (R26.81)   Activity Tolerance Patient tolerated treatment well   Patient Left in chair;with call bell/phone within reach;with chair alarm set;with family/visitor present   Nurse Communication Mobility status;Patient requests pain meds        Time: 0936-1000 OT Time Calculation (min): 24 min  Charges: OT General Charges $OT Visit: 1 Visit OT Treatments $Self Care/Home Management : 23-37 mins  Hattie Aguinaldo L. [facility] [place], OTR/L  06/21/23, 10:20 AM

## 2023-06-21 NOTE — TOC Progression Note (Signed)
 Transition of Care [facility]) - Progression Note    Patient Details  Name: [name] MRN: [id number] Date of Birth: 15-May-1940  Transition of [facility] CM/SW Contact  Jannice Mends, LCSW Phone Number: 06/21/2023, 9:40 AM  Clinical Narrative:    Pasrr for SNF still pending in person review and remains the barrier for discharge.    Expected Discharge Plan: Skilled Nursing Facility Barriers to Discharge: Other (must enter comment) (PASRR pending)  Expected Discharge Plan and Services       Living arrangements for the past 2 months: Single Family Home                                       Social Determinants of Health (SDOH) Interventions SDOH Screenings   Food Insecurity: No Food Insecurity (06/14/2023)  Housing: Low Risk  (06/14/2023)  Transportation Needs: No Transportation Needs (06/14/2023)  Utilities: Not At Risk (06/14/2023)  Alcohol Screen: Low Risk  (11/22/2022)  Depression (PHQ2-9): Medium Risk (11/29/2022)  Financial Resource Strain: Low Risk  (11/22/2022)  Physical Activity: Inactive (11/22/2022)  Social Connections: Socially Isolated (06/14/2023)  Stress: No Stress Concern Present (11/22/2022)  Tobacco Use: Low Risk  (06/13/2023)  Health Literacy: Adequate Health Literacy (11/22/2022)    Readmission Risk Interventions     No data to display

## 2023-06-21 NOTE — Discharge Summary (Signed)
 Physician Discharge Summary  [name]:[id number] DOB: 01/13/1941 DOA: 06/13/2023  PCP: Donnie Galea, MD  Admit date: 06/13/2023 Discharge date: 06/21/2023  Time spent: 24 minutes  Recommendations for Outpatient Follow-up:  Get CBC Chem-7 in about 1 week Needs probable follow-up Dr. Winferd Hatter for Parkinson's with moderate to severe dyskinesia  Discharge Diagnoses:  MAIN problem for hospitalization   Metabolic encephalopathy secondary to issues along with dyskinesia from Parkinson's  Please see below for itemized issues addressed in HOpsital- refer to other progress notes for clarity if needed  Discharge Condition: Improved  Diet recommendation: Regular  Filed Weights   06/15/23 0427  Weight: 44 kg    History of present illness:  83 year old white female-breast cancer in 2007 with monoclonal gammopathy--- had lumpectomy and adjuvant radiotherapy in 2012 Parkinson's disease with dyskinesia on Rytary  and amantadine  Depression   5/19 brought to emergency room for increased confusion and altered mental status--- she had been seen in the ED confusion meds were adjusted by her neurologist-noted recent fall 140 potassium 4.1/creatinine 10/0.6 LFTs normal WBC 5.0 hemoglobin 12.4 platelet 200 TSH 5.5 FT4 0.8, UA negative, RPR negative CT head stable noncontrast CT brain COPD no active disease?  COPD   Plan   Metabolic encephalopathy secondary to underlying parkinsonism This is intermittent but patient has overall not had significant issues during hospital stay continue carbidopa  ER 1 capsule 4 times daily in addition to pimavanserin  34 at bedtime use Klonopin  0.25 at bedtime--use only periodically if really needed for significant hallucinations-she is overall quite stable   Depression Continue Effexor  112.5 daily outpatient titration upward or addition of meds as needed   Prior breast cancer 2007 Status postlumpectomy and adjuvant radiotherapy Outpatient follow-up  with Dr. Liam Redhead   Abnormal TSH--subclinical hypothyroidism? Recheck TSH in about 2 weeks at baseline is not on any replacement therapy   Discharge Exam: Vitals:   06/21/23 0516 06/21/23 0912  BP: (!) 110/58 111/60  Pulse: 71 77  Resp: 16 17  Temp: 98 F (36.7 C) 98.1 F (36.7 C)  SpO2: 98% 98%    Subj on day of d/c   She is unchanged overall has no distress  General Exam on discharge  EOMI NCAT no focal deficit no icterus some extra writhing movements at times Chest is clear no wheeze rales rhonchi Abdominal soft no rebound no guarding ROM is intact Power 5/5  Discharge Instructions   Discharge Instructions     Diet - low sodium heart healthy   Complete by: As directed    Increase activity slowly   Complete by: As directed       Allergies as of 06/21/2023       Reactions   Penicillins Shortness Of Breath, Itching        Medication List     STOP taking these medications    celecoxib  200 MG capsule Commonly known as: CELEBREX        TAKE these medications    aspirin  EC 81 MG tablet Take 1 tablet (81 mg total) by mouth daily. Swallow whole. Start taking on: Jun 22, 2023   clonazePAM  0.5 MG tablet Commonly known as: KLONOPIN  Take 0.5 tablets (0.25 mg total) by mouth at bedtime.   methocarbamol  500 MG tablet Commonly known as: ROBAXIN  TAKE ONE TABLET BY MOUTH EVERY 8 HOURS AS NEEDED FOR MUSCLE SPASMS (SEDATION CAUTION)   multivitamin tablet Take 1 tablet by mouth daily.   Nuplazid  34 MG Caps Generic drug: Pimavanserin  Tartrate Take 1 capsule (34 mg  total) by mouth daily.   omeprazole  20 MG capsule Commonly known as: PRILOSEC  TAKE ONE CAPSULE BY MOUTH ONCE DAILY   PROLIA  [place] Inject into the skin every 6 (six) months.   Rytary  36.25-145 MG Cpcr Generic drug: Carbidopa -Levodopa  ER 1 capsule at 7 AM/11 AM/3 PM/7 PM   venlafaxine  XR 37.5 MG 24 hr capsule Commonly known as: EFFEXOR -XR Take 3 capsules (112.5 mg total) by mouth daily  with breakfast. What changed:  See the new instructions. Another medication with the same name was removed. Continue taking this medication, and follow the directions you see here.       Allergies  Allergen Reactions   Penicillins Shortness Of Breath and Itching      The results of significant diagnostics from this hospitalization (including imaging, microbiology, ancillary and laboratory) are listed below for reference.    Significant Diagnostic Studies: DG Chest Portable 1 View Result Date: 06/13/2023 CLINICAL DATA:  Altered mental status.  Increased confusion. EXAM: PORTABLE CHEST 1 VIEW COMPARISON:  06/04/2023. FINDINGS: Bilateral lungs appear hyperlucent with coarse bronchovascular markings, concerning for underlying COPD. There is a linear opacity overlying the lateral aspect of the left mid lower lung zones, likely external object/skin fold. Bilateral lungs otherwise appear clear. No dense consolidation or lung collapse. Bilateral costophrenic angles are clear. Stable cardio-mediastinal silhouette. No acute osseous abnormalities. Surgical staples noted overlying the right lower hemithorax. The soft tissues are within normal limits. IMPRESSION: *No active disease. Probable COPD. Electronically Signed   By: Beula Brunswick M.D.   On: 06/13/2023 09:49   CT Head Wo Contrast Result Date: 06/13/2023 CLINICAL DATA:  83 year old female with altered mental status and increased confusion. EXAM: CT HEAD WITHOUT CONTRAST TECHNIQUE: Contiguous axial images were obtained from the base of the skull through the vertex without intravenous contrast. RADIATION DOSE REDUCTION: This exam was performed according to the departmental dose-optimization program which includes automated exposure control, adjustment of the mA and/or kV according to patient size and/or use of iterative reconstruction technique. COMPARISON:  Head CT 06/04/2023. FINDINGS: Brain: Cerebral volume is stable, normal for age. No midline  shift, ventriculomegaly, mass effect, evidence of mass lesion, intracranial hemorrhage or evidence of cortically based acute infarction. Gray-white differentiation is stable and normal for age. Vascular: Calcified atherosclerosis at the skull base. No suspicious intracranial vascular hyperdensity. Skull: Stable and intact. Osteopenia. Scaphocephaly, normal variant. Sinuses/Orbits: Visualized paranasal sinuses and mastoids are stable and well aerated. Other: No acute orbit or scalp soft tissue finding. IMPRESSION: Stable and normal for age noncontrast CT appearance of the brain. Electronically Signed   By: Marlise Simpers M.D.   On: 06/13/2023 09:43   DG Chest 2 View Result Date: 06/04/2023 CLINICAL DATA:  Weakness and altered mental status. EXAM: CHEST - 2 VIEW COMPARISON:  December 22, 2021 FINDINGS: The heart size and mediastinal contours are within normal limits. There is no evidence of an acute infiltrate, pleural effusion or pneumothorax. Radiopaque surgical clips are seen within the soft tissues of the anterior chest wall on the right. Chronic and degenerative changes are seen involving the right shoulder and proximal to mid left clavicle. No acute osseous abnormalities are identified. IMPRESSION: Chronic and postoperative changes without evidence of acute cardiopulmonary disease. Electronically Signed   By: Virgle Grime M.D.   On: 06/04/2023 21:09   CT Head Wo Contrast Result Date: 06/04/2023 CLINICAL DATA:  Head trauma, hallucinations for 1 week, more frequent headaches. EXAM: CT HEAD WITHOUT CONTRAST CT CERVICAL SPINE WITHOUT CONTRAST TECHNIQUE: Multidetector CT  imaging of the head and cervical spine was performed following the standard protocol without intravenous contrast. Multiplanar CT image reconstructions of the cervical spine were also generated. RADIATION DOSE REDUCTION: This exam was performed according to the departmental dose-optimization program which includes automated exposure control,  adjustment of the mA and/or kV according to patient size and/or use of iterative reconstruction technique. COMPARISON:  04/20/2023. FINDINGS: CT HEAD FINDINGS Brain: No acute intracranial hemorrhage. No CT evidence of acute infarct. Nonspecific hypoattenuation in the periventricular and subcortical white matter favored to reflect chronic microvascular ischemic changes. No edema, mass effect, or midline shift. The basilar cisterns are patent. Ventricles: The ventricles are normal. Vascular: Atherosclerotic calcifications of the carotid siphons and intracranial vertebral arteries. No hyperdense vessel. Skull: No acute or aggressive finding. Orbits: Orbits are symmetric. Sinuses: The visualized paranasal sinuses are clear. Other: Mastoid air cells are clear. CT CERVICAL SPINE FINDINGS Alignment: Cervical lordosis is maintained. Trace anterolisthesis of C2 on C3. Trace retrolisthesis of C3 on C4, C4 on C5. Additional trace anterolisthesis C6 on C7 and C7 on T1. No facet subluxation or dislocation. Skull base and vertebrae: No acute fracture. No primary bone lesion or focal pathologic process. Anterior endplate osteophytes at multiple levels most pronounced at C3-4 and C4-5. Soft tissues and spinal canal: No prevertebral fluid or swelling. No visible canal hematoma. Disc levels: Intervertebral disc space narrowing at multiple levels greatest at C3-4. Disc osteophyte complexes at multiple levels in the cervical spine. There is mild-to-moderate spinal canal stenosis at C4-5. Facet arthrosis and uncovertebral hypertrophy at multiple levels. Foraminal narrowing most pronounced on the right at C4-5. Upper chest: Negative. Other: None. IMPRESSION: No CT evidence of acute intracranial abnormality. No acute fracture of the cervical spine. Similar listhesis at multiple levels likely related to chronic facet degenerative changes. Electronically Signed   By: Denny Flack M.D.   On: 06/04/2023 20:07   CT Cervical Spine Wo  Contrast Result Date: 06/04/2023 CLINICAL DATA:  Head trauma, hallucinations for 1 week, more frequent headaches. EXAM: CT HEAD WITHOUT CONTRAST CT CERVICAL SPINE WITHOUT CONTRAST TECHNIQUE: Multidetector CT imaging of the head and cervical spine was performed following the standard protocol without intravenous contrast. Multiplanar CT image reconstructions of the cervical spine were also generated. RADIATION DOSE REDUCTION: This exam was performed according to the departmental dose-optimization program which includes automated exposure control, adjustment of the mA and/or kV according to patient size and/or use of iterative reconstruction technique. COMPARISON:  04/20/2023. FINDINGS: CT HEAD FINDINGS Brain: No acute intracranial hemorrhage. No CT evidence of acute infarct. Nonspecific hypoattenuation in the periventricular and subcortical white matter favored to reflect chronic microvascular ischemic changes. No edema, mass effect, or midline shift. The basilar cisterns are patent. Ventricles: The ventricles are normal. Vascular: Atherosclerotic calcifications of the carotid siphons and intracranial vertebral arteries. No hyperdense vessel. Skull: No acute or aggressive finding. Orbits: Orbits are symmetric. Sinuses: The visualized paranasal sinuses are clear. Other: Mastoid air cells are clear. CT CERVICAL SPINE FINDINGS Alignment: Cervical lordosis is maintained. Trace anterolisthesis of C2 on C3. Trace retrolisthesis of C3 on C4, C4 on C5. Additional trace anterolisthesis C6 on C7 and C7 on T1. No facet subluxation or dislocation. Skull base and vertebrae: No acute fracture. No primary bone lesion or focal pathologic process. Anterior endplate osteophytes at multiple levels most pronounced at C3-4 and C4-5. Soft tissues and spinal canal: No prevertebral fluid or swelling. No visible canal hematoma. Disc levels: Intervertebral disc space narrowing at multiple levels greatest at C3-4.  Disc osteophyte complexes at  multiple levels in the cervical spine. There is mild-to-moderate spinal canal stenosis at C4-5. Facet arthrosis and uncovertebral hypertrophy at multiple levels. Foraminal narrowing most pronounced on the right at C4-5. Upper chest: Negative. Other: None. IMPRESSION: No CT evidence of acute intracranial abnormality. No acute fracture of the cervical spine. Similar listhesis at multiple levels likely related to chronic facet degenerative changes. Electronically Signed   By: Denny Flack M.D.   On: 06/04/2023 20:07    Microbiology: No results found for this or any previous visit (from the past 240 hours).   Labs: Basic Metabolic Panel: Recent Labs  Lab 06/15/23 0452 06/20/23 0517 06/21/23 0440  NA 140 141 140  K 3.8 4.0 4.2  CL 106 106 105  CO2 26 26 29   GLUCOSE 94 95 101*  BUN 11 17 19   CREATININE 0.61 0.62 0.63  CALCIUM 8.6* 8.5* 8.8*   Liver Function Tests: Recent Labs  Lab 06/15/23 0452  AST 21  ALT 5  ALKPHOS 57  BILITOT 0.7  PROT 5.8*  ALBUMIN 3.0*   No results for input(s): "LIPASE", "AMYLASE" in the last 168 hours. No results for input(s): "AMMONIA" in the last 168 hours. CBC: Recent Labs  Lab 06/15/23 0452 06/20/23 0517 06/21/23 0440  WBC 5.5 4.2 4.3  NEUTROABS  --  1.8 1.8  HGB 12.2 11.8* 11.6*  HCT 37.8 36.9 35.9*  MCV 102.7* 103.7* 101.7*  PLT 193 193 198   Cardiac Enzymes: No results for input(s): "CKTOTAL", "CKMB", "CKMBINDEX", "TROPONINI" in the last 168 hours. BNP: BNP (last 3 results) No results for input(s): "BNP" in the last 8760 hours.  ProBNP (last 3 results) No results for input(s): "PROBNP" in the last 8760 hours.  CBG: Recent Labs  Lab 06/19/23 0803 06/19/23 1127 06/19/23 1202  GLUCAP 269* 210* 96    Signed:  Verlie Glisson MD   [facility] 06/21/2023, 10:06 AM

## 2023-06-22 ENCOUNTER — Telehealth: Payer: Self-pay | Admitting: Neurology

## 2023-06-22 DIAGNOSIS — G9341 Metabolic encephalopathy: Secondary | ICD-10-CM | POA: Diagnosis not present

## 2023-06-22 DIAGNOSIS — G9340 Encephalopathy, unspecified: Secondary | ICD-10-CM | POA: Diagnosis not present

## 2023-06-22 LAB — BASIC METABOLIC PANEL WITH GFR
Anion gap: 5 (ref 5–15)
BUN: 18 mg/dL (ref 8–23)
CO2: 28 mmol/L (ref 22–32)
Calcium: 8.4 mg/dL — ABNORMAL LOW (ref 8.9–10.3)
Chloride: 105 mmol/L (ref 98–111)
Creatinine, Ser: 0.71 mg/dL (ref 0.44–1.00)
GFR, Estimated: >60 mL/min (ref >60)
Glucose, Bld: 91 mg/dL (ref 70–99)
Potassium: 4.4 mmol/L (ref 3.5–5.1)
Sodium: 138 mmol/L (ref 135–145)

## 2023-06-22 LAB — CBC WITH DIFFERENTIAL/PLATELET
Abs Immature Granulocytes: 0.01 10*3/uL (ref 0.00–0.07)
Basophils Absolute: 0 10*3/uL (ref 0.0–0.1)
Basophils Relative: 1 %
Eosinophils Absolute: 0.3 10*3/uL (ref 0.0–0.5)
Eosinophils Relative: 6 %
HCT: 36.1 % (ref 36.0–46.0)
Hemoglobin: 11.9 g/dL — ABNORMAL LOW (ref 12.0–15.0)
Immature Granulocytes: 0 %
Lymphocytes Relative: 39 %
Lymphs Abs: 1.7 10*3/uL (ref 0.7–4.0)
MCH: 33.8 pg (ref 26.0–34.0)
MCHC: 33 g/dL (ref 30.0–36.0)
MCV: 102.6 fL — ABNORMAL HIGH (ref 80.0–100.0)
Monocytes Absolute: 0.3 10*3/uL (ref 0.1–1.0)
Monocytes Relative: 8 %
Neutro Abs: 2 10*3/uL (ref 1.7–7.7)
Neutrophils Relative %: 46 %
Platelets: 201 10*3/uL (ref 150–400)
RBC: 3.52 MIL/uL — ABNORMAL LOW (ref 3.87–5.11)
RDW: 13.4 % (ref 11.5–15.5)
WBC: 4.3 10*3/uL (ref 4.0–10.5)
nRBC: 0 % (ref 0.0–0.2)

## 2023-06-22 MED ORDER — B COMPLEX VITAMINS PO CAPS: 1.0000 | ORAL_CAPSULE | Freq: Every day | ORAL | 0 refills | Status: AC

## 2023-06-22 MED ORDER — CYANOCOBALAMIN 1000 MCG PO TABS
1000.0000 ug | ORAL_TABLET | Freq: Every day | ORAL | 0 refills | Status: DC
Start: 2023-06-22 — End: 2023-07-24

## 2023-06-22 NOTE — Progress Notes (Signed)
 Physical Therapy Treatment Patient Details Name: [name] MRN: [id number] DOB: Apr 12, 1940 Today's Date: 06/22/2023   History of Present Illness Pt is 83 yo female who presents on 06/13/23 with increased hallucinations over 2 weeks per son. PMH: Parkinsons, breast cancer, HLD, vertical diplopia, arthritis, monoclonal gammopathy    PT Comments  Focused session on lower extremity strengthening, endurance training, and transfer training. She has a tendency to lean posteriorly while rising to stand from sit, resulting in her need for minA during transfers. This places her at risk for falls as well. She needed multi-modal cues and repeated instructions to flex at her hips and bring her "nose over her toes" while rocking her trunk to gain momentum to transfer to stand with less of a posterior lean, ranging from needing CGA-minA depending on how well she was able to shift her weight anteriorly. Will continue to follow acutely.     If plan is discharge home, recommend the following: A little help with walking and/or transfers;A little help with bathing/dressing/bathroom;Assistance with cooking/housework;Direct supervision/assist for medications management;Direct supervision/assist for financial management;Assist for transportation;Help with stairs or ramp for entrance;Supervision due to cognitive status   Can travel by private vehicle     Yes  Equipment Recommendations  None recommended by PT    Recommendations for Other Services       Precautions / Restrictions Precautions Precautions: Fall Recall of Precautions/Restrictions: Impaired Precaution/Restrictions Comments: pt states that she falls a lot but still does not always remember to keep RW with her Restrictions Weight Bearing Restrictions Per Provider Order: No     Mobility  Bed Mobility               General bed mobility comments: OOB in recliner upon arrival and at end of session    Transfers Overall transfer level:  Needs assistance Equipment used: Rolling walker (2 wheels) Transfers: Sit to/from Stand Sit to Stand: Min assist, Contact guard assist           General transfer comment: Pt demonstrates a tendency to lean posteriorly upon transferring to stand, often due to not flexing at her trunk and shifting her weight anteriorly adequately enough. Visual, tactile, and verbal cues provided to bring "nose over toes", gain momentum through rocking trunk, and proper hand placement with sit <> stand transfers. Pt completed 2 bouts of x5 reps each bout with CGA-minA.    Ambulation/Gait Ambulation/Gait assistance: Contact guard assist Gait Distance (Feet): 430 Feet Assistive device: Rolling walker (2 wheels) Gait Pattern/deviations: Decreased step length - right, Decreased step length - left, Decreased stride length, Narrow base of support Gait velocity: decreased Gait velocity interpretation: 1.31 - 2.62 ft/sec, indicative of limited community ambulator   General Gait Details: Pt did better not bumping into obstacles today, cuing her to play "eye spy" to look for objects of various colors in the halls to improve visual scanning while ambulating. No LOB, CGA for safety   Stairs             Wheelchair Mobility     Tilt Bed    Modified Rankin (Stroke Patients Only)       Balance Overall balance assessment: Needs assistance, History of Falls Sitting-balance support: No upper extremity supported, Feet supported Sitting balance-Leahy Scale: Good Sitting balance - Comments: seated on recliner without back support Postural control: Posterior lean Standing balance support: Bilateral upper extremity supported, During functional activity Standing balance-Leahy Scale: Poor Standing balance comment: Reliant on RW, posterior lean and minA needed initially  upon standing.                            Communication Communication Communication: No apparent difficulties  Cognition Arousal:  Alert Behavior During Therapy: Flat affect   PT - Cognitive impairments: Safety/Judgement                       PT - Cognition Comments: STM deficits. Needs cues to remember to scan environment during mobility and to flex at trunk to shift weight anteriorly with transfers Following commands: Impaired Following commands impaired: Follows one step commands with increased time    Cueing Cueing Techniques: Verbal cues, Gestural cues, Tactile cues, Visual cues  Exercises General Exercises - Lower Extremity Hip ABduction/ADduction: AROM, Strengthening, Both, 10 reps, Standing (with hands on wall rail for support) Hip Flexion/Marching: AROM, Strengthening, Both, 10 reps, Standing (with hands on wall rail for support) Other Exercises Other Exercises: 2x5 sit <> stand reps with CGA-minA Other Exercises: AROM knee flexion in standing x10 bil with UE support on wall rail    General Comments General comments (skin integrity, edema, etc.): discussed d/c support needs with pt and family and family still wanting SNF      Pertinent Vitals/Pain Pain Assessment Pain Assessment: Faces Faces Pain Scale: Hurts a little bit Pain Location: R knee (OA) Pain Descriptors / Indicators: Sore Pain Intervention(s): Limited activity within patient's tolerance, Monitored during session, Repositioned    Home Living                          Prior Function            PT Goals (current goals can now be found in the care plan section) Acute Rehab PT Goals Patient Stated Goal: go be with her daughter PT Goal Formulation: With patient/family Time For Goal Achievement: 06/28/23 Potential to Achieve Goals: Fair Progress towards PT goals: Progressing toward goals    Frequency    Min 2X/week      PT Plan      Co-evaluation              AM-PAC PT "6 Clicks" Mobility   Outcome Measure  Help needed turning from your back to your side while in a flat bed without using  bedrails?: None Help needed moving from lying on your back to sitting on the side of a flat bed without using bedrails?: A Little Help needed moving to and from a bed to a chair (including a wheelchair)?: A Little Help needed standing up from a chair using your arms (e.g., wheelchair or bedside chair)?: A Little Help needed to walk in hospital room?: A Little Help needed climbing 3-5 steps with a railing? : A Little 6 Click Score: 19    End of Session Equipment Utilized During Treatment: Gait belt Activity Tolerance: Patient tolerated treatment well Patient left: in chair;with call bell/phone within reach;with chair alarm set;with family/visitor present   PT Visit Diagnosis: Unsteadiness on feet (R26.81);Repeated falls (R29.6);Difficulty in walking, not elsewhere classified (R26.2);Other abnormalities of gait and mobility (R26.89);History of falling (Z91.81)     Time: 1405-1430 PT Time Calculation (min) (ACUTE ONLY): 25 min  Charges:    $Therapeutic Exercise: 8-22 mins $Therapeutic Activity: 8-22 mins PT General Charges $$ ACUTE PT VISIT: 1 Visit  [name], PT, DPT Acute Rehabilitation Services  Office: [phone]   [name] 06/22/2023, 5:07 PM

## 2023-06-22 NOTE — Telephone Encounter (Signed)
 Called [facility] and said that the meds will be delivered today

## 2023-06-22 NOTE — Discharge Summary (Signed)
 Physician Discharge Summary   Patient: [name] MRN: [id number] DOB: [birth year]  Admit date:     06/13/2023  Discharge date: 06/22/23  Discharge Physician: Charlean Congress  PCP: Donnie Galea, MD  Recommendations at discharge: Follow-up with PCP in 1 week. Follow-up with neurology as scheduled in June 2025.   Follow-up Information     Donnie Galea, MD. Schedule an appointment as soon as possible for a visit in 1 week(s).   Specialty: Family Medicine Contact information: [address] [place] [address] [phone]         Tat, Von Grumbling, DO. Go to.   Specialty: Neurology Why: the Appointment As Scheduled Contact information: [address] [place]  [address] [place] [address] [phone]                Discharge Diagnoses: Principal Problem:   Acute encephalopathy Active Problems:   Breast cancer (HCC)   Parkinson's disease (HCC)   Hallucinations   RBD (REM behavioral disorder)   Depression   Macrocytosis  Hospital Course:  83 y.o. female with history of Parkinson's disease has been having increasing hallucination over the last 2 weeks.  Patient's son provided the history.  Patient's son states that about a week ago patient had severe hallucination and was taken to the ER and at the ER CT head was unremarkable and most of the lab works were unremarkable and was advised to follow-up with neurologist.  Patient follows with her neurologist and patient's amantadine  was discontinued and started on Nuplazid .  So far patient has taken 2 dose of Nuplazid .  As per the son patient is becoming increasingly confused and not able to recognize patient's son.  Over the last one week patient has been staying at her son's house.  Has not had any nausea vomiting diarrhea chest pain shortness of breath fever chills productive cough.  Patient's son is not sure if she has been taking her medicines as advised previously but while in his house he has she has  been taking it regularly.   Patient's significant other has been recently placed in an assisted living facility.  She has had increased confusion in regards to remembering who people are, recognizing people, and hallucinations including seeing people that are not there and most recently on 5/18 her daughter-in-law states that she was seeing bugs on the ground. Patient states her hallucinations are typically worse at night and do improve if the lights are left on, however family notices them during the day as well.   Assessment and plan. Acute metabolic encephalopathy Relative B12 deficiency. Hallucination  history of parkinsonism. Patient was recently started on Nuplazid  for hallucination in the setting of Parkinson's disease. Neurology consulted. Appreciate recommendation. Continue Nuplazid  that was started on 5/16, it may take 6 weeks to see full effect Continue Rytary .  7 AM 11 AM 3 PM and 7 PM. Stay off amantadine . Continue clonazepam . B12 level 392.  Relatively low.  Goal more than 500.  Continue supplementation.  Recheck in 1 month.  REM behavioral disorder-RBD Continued on Klonopin .  Depression No SI. On Effexor .  Sialorrhea On Xeomin  injections.  Underweight. Failure to thrive. Body mass index is 17.18 kg/m.  Placing the patient at high risk for poor outcome. Would recommend nutritional supplementation.  History of breast cancer. Remote history. Outpatient follow-up as needed  Abnormal TSH. TSH 5.56 , Free T4 0.8.  Recommend recheck outpatient.  Consultants:  Neurology   Procedures performed:  none  DISCHARGE  MEDICATION: Allergies as of 06/22/2023       Reactions   Penicillins Shortness Of Breath, Itching        Medication List     STOP taking these medications    celecoxib  200 MG capsule Commonly known as: CELEBREX        TAKE these medications    aspirin EC 81 MG tablet Take 1 tablet (81 mg total) by mouth daily. Swallow whole.   b  complex vitamins capsule Take 1 capsule by mouth daily.   clonazePAM  0.5 MG tablet Commonly known as: KLONOPIN  Take 0.5 tablets (0.25 mg total) by mouth at bedtime.   cyanocobalamin  1000 MCG tablet Take 1 tablet (1,000 mcg total) by mouth daily.   methocarbamol  500 MG tablet Commonly known as: ROBAXIN  TAKE ONE TABLET BY MOUTH EVERY 8 HOURS AS NEEDED FOR MUSCLE SPASMS (SEDATION CAUTION)   multivitamin tablet Take 1 tablet by mouth daily.   Nuplazid  34 MG Caps Generic drug: Pimavanserin  Tartrate Take 1 capsule (34 mg total) by mouth daily.   omeprazole  20 MG capsule Commonly known as: PRILOSEC  TAKE ONE CAPSULE BY MOUTH ONCE DAILY   PROLIA  [place] Inject into the skin every 6 (six) months.   Rytary  36.25-145 MG Cpcr Generic drug: Carbidopa -Levodopa  ER 1 capsule at 7 AM/11 AM/3 PM/7 PM   venlafaxine  XR 37.5 MG 24 hr capsule Commonly known as: EFFEXOR -XR Take 3 capsules (112.5 mg total) by mouth daily with breakfast. What changed:  See the new instructions. Another medication with the same name was removed. Continue taking this medication, and follow the directions you see here.       Disposition: SNF Diet recommendation: Regular diet  Discharge Exam: Vitals:   06/21/23 1728 06/21/23 2023 06/22/23 0508 06/22/23 0747  BP: 110/60 111/62 122/60 137/65  Pulse: 87 90 71 74  Resp: 19 18 18    Temp: 98.8 F (37.1 C) 98.1 F (36.7 C) 98.3 F (36.8 C) (!) 97.3 F (36.3 C)  TempSrc: Oral  Oral Oral  SpO2: 99% 97% 100% 99%  Weight:      Height:       General: Appear in no distress; no visible Abnormal Neck Mass Or lumps, Conjunctiva normal Cardiovascular: S1 and S2 Present, no Murmur, Respiratory: good respiratory effort, Bilateral Air entry present and CTA, no Crackles, no wheezes Abdomen: Bowel Sound present, Non tender  Extremities: no Pedal edema Neurology: alert and oriented to time, place, and person parkinsonian tremors seen.   Filed Weights   06/15/23 0427   Weight: 44 kg   Condition at discharge: stable  The results of significant diagnostics from this hospitalization (including imaging, microbiology, ancillary and laboratory) are listed below for reference.   Imaging Studies: DG Chest Portable 1 View Result Date: 06/13/2023 CLINICAL DATA:  Altered mental status.  Increased confusion. EXAM: PORTABLE CHEST 1 VIEW COMPARISON:  06/04/2023. FINDINGS: Bilateral lungs appear hyperlucent with coarse bronchovascular markings, concerning for underlying COPD. There is a linear opacity overlying the lateral aspect of the left mid lower lung zones, likely external object/skin fold. Bilateral lungs otherwise appear clear. No dense consolidation or lung collapse. Bilateral costophrenic angles are clear. Stable cardio-mediastinal silhouette. No acute osseous abnormalities. Surgical staples noted overlying the right lower hemithorax. The soft tissues are within normal limits. IMPRESSION: *No active disease. Probable COPD. Electronically Signed   By: Beula Brunswick M.D.   On: 06/13/2023 09:49   CT Head Wo Contrast Result Date: 06/13/2023 CLINICAL DATA:  83 year old female with altered mental status and increased  confusion. EXAM: CT HEAD WITHOUT CONTRAST TECHNIQUE: Contiguous axial images were obtained from the base of the skull through the vertex without intravenous contrast. RADIATION DOSE REDUCTION: This exam was performed according to the departmental dose-optimization program which includes automated exposure control, adjustment of the mA and/or kV according to patient size and/or use of iterative reconstruction technique. COMPARISON:  Head CT 06/04/2023. FINDINGS: Brain: Cerebral volume is stable, normal for age. No midline shift, ventriculomegaly, mass effect, evidence of mass lesion, intracranial hemorrhage or evidence of cortically based acute infarction. Gray-white differentiation is stable and normal for age. Vascular: Calcified atherosclerosis at the skull base.  No suspicious intracranial vascular hyperdensity. Skull: Stable and intact. Osteopenia. Scaphocephaly, normal variant. Sinuses/Orbits: Visualized paranasal sinuses and mastoids are stable and well aerated. Other: No acute orbit or scalp soft tissue finding. IMPRESSION: Stable and normal for age noncontrast CT appearance of the brain. Electronically Signed   By: Marlise Simpers M.D.   On: 06/13/2023 09:43   DG Chest 2 View Result Date: 06/04/2023 CLINICAL DATA:  Weakness and altered mental status. EXAM: CHEST - 2 VIEW COMPARISON:  December 22, 2021 FINDINGS: The heart size and mediastinal contours are within normal limits. There is no evidence of an acute infiltrate, pleural effusion or pneumothorax. Radiopaque surgical clips are seen within the soft tissues of the anterior chest wall on the right. Chronic and degenerative changes are seen involving the right shoulder and proximal to mid left clavicle. No acute osseous abnormalities are identified. IMPRESSION: Chronic and postoperative changes without evidence of acute cardiopulmonary disease. Electronically Signed   By: Virgle Grime M.D.   On: 06/04/2023 21:09   CT Head Wo Contrast Result Date: 06/04/2023 CLINICAL DATA:  Head trauma, hallucinations for 1 week, more frequent headaches. EXAM: CT HEAD WITHOUT CONTRAST CT CERVICAL SPINE WITHOUT CONTRAST TECHNIQUE: Multidetector CT imaging of the head and cervical spine was performed following the standard protocol without intravenous contrast. Multiplanar CT image reconstructions of the cervical spine were also generated. RADIATION DOSE REDUCTION: This exam was performed according to the departmental dose-optimization program which includes automated exposure control, adjustment of the mA and/or kV according to patient size and/or use of iterative reconstruction technique. COMPARISON:  04/20/2023. FINDINGS: CT HEAD FINDINGS Brain: No acute intracranial hemorrhage. No CT evidence of acute infarct. Nonspecific  hypoattenuation in the periventricular and subcortical white matter favored to reflect chronic microvascular ischemic changes. No edema, mass effect, or midline shift. The basilar cisterns are patent. Ventricles: The ventricles are normal. Vascular: Atherosclerotic calcifications of the carotid siphons and intracranial vertebral arteries. No hyperdense vessel. Skull: No acute or aggressive finding. Orbits: Orbits are symmetric. Sinuses: The visualized paranasal sinuses are clear. Other: Mastoid air cells are clear. CT CERVICAL SPINE FINDINGS Alignment: Cervical lordosis is maintained. Trace anterolisthesis of C2 on C3. Trace retrolisthesis of C3 on C4, C4 on C5. Additional trace anterolisthesis C6 on C7 and C7 on T1. No facet subluxation or dislocation. Skull base and vertebrae: No acute fracture. No primary bone lesion or focal pathologic process. Anterior endplate osteophytes at multiple levels most pronounced at C3-4 and C4-5. Soft tissues and spinal canal: No prevertebral fluid or swelling. No visible canal hematoma. Disc levels: Intervertebral disc space narrowing at multiple levels greatest at C3-4. Disc osteophyte complexes at multiple levels in the cervical spine. There is mild-to-moderate spinal canal stenosis at C4-5. Facet arthrosis and uncovertebral hypertrophy at multiple levels. Foraminal narrowing most pronounced on the right at C4-5. Upper chest: Negative. Other: None. IMPRESSION: No CT evidence  of acute intracranial abnormality. No acute fracture of the cervical spine. Similar listhesis at multiple levels likely related to chronic facet degenerative changes. Electronically Signed   By: Denny Flack M.D.   On: 06/04/2023 20:07   CT Cervical Spine Wo Contrast Result Date: 06/04/2023 CLINICAL DATA:  Head trauma, hallucinations for 1 week, more frequent headaches. EXAM: CT HEAD WITHOUT CONTRAST CT CERVICAL SPINE WITHOUT CONTRAST TECHNIQUE: Multidetector CT imaging of the head and cervical spine was  performed following the standard protocol without intravenous contrast. Multiplanar CT image reconstructions of the cervical spine were also generated. RADIATION DOSE REDUCTION: This exam was performed according to the departmental dose-optimization program which includes automated exposure control, adjustment of the mA and/or kV according to patient size and/or use of iterative reconstruction technique. COMPARISON:  04/20/2023. FINDINGS: CT HEAD FINDINGS Brain: No acute intracranial hemorrhage. No CT evidence of acute infarct. Nonspecific hypoattenuation in the periventricular and subcortical white matter favored to reflect chronic microvascular ischemic changes. No edema, mass effect, or midline shift. The basilar cisterns are patent. Ventricles: The ventricles are normal. Vascular: Atherosclerotic calcifications of the carotid siphons and intracranial vertebral arteries. No hyperdense vessel. Skull: No acute or aggressive finding. Orbits: Orbits are symmetric. Sinuses: The visualized paranasal sinuses are clear. Other: Mastoid air cells are clear. CT CERVICAL SPINE FINDINGS Alignment: Cervical lordosis is maintained. Trace anterolisthesis of C2 on C3. Trace retrolisthesis of C3 on C4, C4 on C5. Additional trace anterolisthesis C6 on C7 and C7 on T1. No facet subluxation or dislocation. Skull base and vertebrae: No acute fracture. No primary bone lesion or focal pathologic process. Anterior endplate osteophytes at multiple levels most pronounced at C3-4 and C4-5. Soft tissues and spinal canal: No prevertebral fluid or swelling. No visible canal hematoma. Disc levels: Intervertebral disc space narrowing at multiple levels greatest at C3-4. Disc osteophyte complexes at multiple levels in the cervical spine. There is mild-to-moderate spinal canal stenosis at C4-5. Facet arthrosis and uncovertebral hypertrophy at multiple levels. Foraminal narrowing most pronounced on the right at C4-5. Upper chest: Negative. Other:  None. IMPRESSION: No CT evidence of acute intracranial abnormality. No acute fracture of the cervical spine. Similar listhesis at multiple levels likely related to chronic facet degenerative changes. Electronically Signed   By: Denny Flack M.D.   On: 06/04/2023 20:07    Microbiology: Results for orders placed or performed in visit on 01/30/18  Urine Culture     Status: None   Collection Time: 01/30/18  1:11 PM   Specimen: Urine  Result Value Ref Range Status   MICRO NUMBER: [id number]  Final   SPECIMEN QUALITY: Adequate  Final   Sample Source URINE  Final   STATUS: FINAL  Final   Result: No Growth  Final   *Note: Due to a large number of results and/or encounters for the requested time period, some results have not been displayed. A complete set of results can be found in Results Review.   Labs: CBC: Recent Labs  Lab 06/20/23 0517 06/21/23 0440 06/22/23 0503  WBC 4.2 4.3 4.3  NEUTROABS 1.8 1.8 2.0  HGB 11.8* 11.6* 11.9*  HCT 36.9 35.9* 36.1  MCV 103.7* 101.7* 102.6*  PLT 193 198 201   Basic Metabolic Panel: Recent Labs  Lab 06/20/23 0517 06/21/23 0440 06/22/23 0503  NA 141 140 138  K 4.0 4.2 4.4  CL 106 105 105  CO2 26 29 28   GLUCOSE 95 101* 91  BUN 17 19 18   CREATININE 0.62 0.63 0.71  CALCIUM 8.5* 8.8* 8.4*   Liver Function Tests: No results for input(s): "AST", "ALT", "ALKPHOS", "BILITOT", "PROT", "ALBUMIN" in the last 168 hours. CBG: Recent Labs  Lab 06/19/23 0803 06/19/23 1127 06/19/23 1202  GLUCAP 269* 210* 96    Discharge time spent: greater than 30 minutes.  Author: Charlean Congress, MD  [facility]

## 2023-06-22 NOTE — TOC Progression Note (Signed)
 Transition of Care [facility]) - Progression Note    Patient Details  Name: [name] MRN: [id number] Date of Birth: December 06, 1940  [facility] CM/SW Contact  Valery Gaucher, [place] Phone Number: 06/22/2023, 11:40 AM  Clinical Narrative:     PASRR still pending- per previous CSW note on 5/23- [facility] will come to evaluate the patient in person, which today is the final day to complete evaluation. Once evaluation is completed, TOC will still have to wait for PASRR approval in the system. Once PASRR is approved patient is expected to discharge to [facility].  [facility] confirmed they still have availability at this time. TOC will continue to follow and assist with discharge planning.   Liddie Reel, MSW, LCSW Clinical Social Worker     Expected Discharge Plan: Skilled Nursing Facility Barriers to Discharge: Other (must enter comment) (PASRR pending)  Expected Discharge Plan and Services       Living arrangements for the past 2 months: Single Family Home Expected Discharge Date: 06/21/23                                     Social Determinants of Health (SDOH) Interventions SDOH Screenings   Food Insecurity: No Food Insecurity (06/14/2023)  Housing: Low Risk  (06/14/2023)  Transportation Needs: No Transportation Needs (06/14/2023)  Utilities: Not At Risk (06/14/2023)  Alcohol Screen: Low Risk  (11/22/2022)  Depression (PHQ2-9): Medium Risk (11/29/2022)  Financial Resource Strain: Low Risk  (11/22/2022)  Physical Activity: Inactive (11/22/2022)  Social Connections: Socially Isolated (06/14/2023)  Stress: No Stress Concern Present (11/22/2022)  Tobacco Use: Low Risk  (06/13/2023)  Health Literacy: Adequate Health Literacy (11/22/2022)    Readmission Risk Interventions     No data to display

## 2023-06-22 NOTE — Plan of Care (Signed)
  Problem: Activity: Goal: Risk for activity intolerance will decrease Outcome: Completed/Met

## 2023-06-22 NOTE — TOC Progression Note (Signed)
 Transition of Care [facility]) - Progression Note    Patient Details  Name: [name] MRN: [id number] Date of Birth: 1940-07-16  Transition of Care [facility]) CM/SW Contact  Valery Gaucher, [place] Phone Number: 06/22/2023, 1:59 PM  Clinical Narrative:     CSW met with the patient and her family ( son, DIL,sister)- family confirmed [facility] was there and completed evaluation for PASRR. CSW explained, now we will be waiting  for approval, which could take several more days. Family prefers to wait on PASRR approval. Family remains agreeable to d/c to [facility].   Liddie Reel, MSW, LCSW Clinical Social Worker    Expected Discharge Plan: Skilled Nursing Facility Barriers to Discharge: Other (must enter comment) (PASRR pending)  Expected Discharge Plan and Services       Living arrangements for the past 2 months: Single Family Home Expected Discharge Date: 06/21/23                                     Social Determinants of Health (SDOH) Interventions SDOH Screenings   Food Insecurity: No Food Insecurity (06/14/2023)  Housing: Low Risk  (06/14/2023)  Transportation Needs: No Transportation Needs (06/14/2023)  Utilities: Not At Risk (06/14/2023)  Alcohol Screen: Low Risk  (11/22/2022)  Depression (PHQ2-9): Medium Risk (11/29/2022)  Financial Resource Strain: Low Risk  (11/22/2022)  Physical Activity: Inactive (11/22/2022)  Social Connections: Socially Isolated (06/14/2023)  Stress: No Stress Concern Present (11/22/2022)  Tobacco Use: Low Risk  (06/13/2023)  Health Literacy: Adequate Health Literacy (11/22/2022)    Readmission Risk Interventions     No data to display

## 2023-06-22 NOTE — Plan of Care (Signed)
   Problem: Health Behavior/Discharge Planning: Goal: Ability to manage health-related needs will improve Outcome: Completed/Met

## 2023-06-22 NOTE — Progress Notes (Signed)
 Mobility Specialist Progress Note:    06/22/23 0900  Mobility  Activity Ambulated with assistance in hallway  Level of Assistance Contact guard assist, steadying assist  Assistive Device Front wheel walker  Distance Ambulated (ft) 400 ft  Activity Response Tolerated well  Mobility Referral Yes  Mobility visit 1 Mobility  Mobility Specialist Start Time (ACUTE ONLY) [id number]  Mobility Specialist Stop Time (ACUTE ONLY) 0817  Mobility Specialist Time Calculation (min) (ACUTE ONLY) 9 min   Received pt in bed having no complaints and agreeable to mobility. Pt was asymptomatic throughout ambulation and returned to room w/o fault. Left in chair w/ call bell in reach and all needs met. Family present.   D'Vante Nolon Baxter Mobility Specialist Please contact via Special educational needs teacher or Rehab office at [phone]

## 2023-06-22 NOTE — Telephone Encounter (Signed)
 Left a message with the after hour service on 06-22-23   Caller states that his mother was in recently and was started on a medication that they were given  a two week supply for. They had a rep from the drug company call them to help with a program for the medication. He was told that they would send another two week supply out to the home and would also get a email. He has not had any of this has happen. She has been without the mediation since this past Friday. He did get a email yesterday that the pharmacy was working on it.   She is at [facility] currently being monitored he would a call back to help coordinate this program and medication. He does not want her to be without her medication

## 2023-06-23 ENCOUNTER — Telehealth: Payer: Self-pay | Admitting: Neurology

## 2023-06-23 DIAGNOSIS — Z681 Body mass index (BMI) 19 or less, adult: Secondary | ICD-10-CM | POA: Diagnosis not present

## 2023-06-23 DIAGNOSIS — Z853 Personal history of malignant neoplasm of breast: Secondary | ICD-10-CM | POA: Diagnosis not present

## 2023-06-23 DIAGNOSIS — G9341 Metabolic encephalopathy: Secondary | ICD-10-CM | POA: Diagnosis not present

## 2023-06-23 DIAGNOSIS — F419 Anxiety disorder, unspecified: Secondary | ICD-10-CM | POA: Diagnosis not present

## 2023-06-23 DIAGNOSIS — R627 Adult failure to thrive: Secondary | ICD-10-CM | POA: Diagnosis not present

## 2023-06-23 DIAGNOSIS — R443 Hallucinations, unspecified: Secondary | ICD-10-CM | POA: Diagnosis not present

## 2023-06-23 DIAGNOSIS — G20C Parkinsonism, unspecified: Secondary | ICD-10-CM | POA: Diagnosis not present

## 2023-06-23 DIAGNOSIS — E538 Deficiency of other specified B group vitamins: Secondary | ICD-10-CM | POA: Diagnosis not present

## 2023-06-23 DIAGNOSIS — D7589 Other specified diseases of blood and blood-forming organs: Secondary | ICD-10-CM | POA: Diagnosis not present

## 2023-06-23 DIAGNOSIS — F32A Depression, unspecified: Secondary | ICD-10-CM | POA: Diagnosis not present

## 2023-06-23 DIAGNOSIS — K117 Disturbances of salivary secretion: Secondary | ICD-10-CM | POA: Diagnosis not present

## 2023-06-23 DIAGNOSIS — G4752 REM sleep behavior disorder: Secondary | ICD-10-CM | POA: Diagnosis not present

## 2023-06-23 DIAGNOSIS — K219 Gastro-esophageal reflux disease without esophagitis: Secondary | ICD-10-CM | POA: Diagnosis not present

## 2023-06-23 DIAGNOSIS — G20A1 Parkinson's disease without dyskinesia, without mention of fluctuations: Secondary | ICD-10-CM | POA: Diagnosis not present

## 2023-06-23 DIAGNOSIS — E038 Other specified hypothyroidism: Secondary | ICD-10-CM | POA: Diagnosis not present

## 2023-06-23 DIAGNOSIS — G20B1 Parkinson's disease with dyskinesia, without mention of fluctuations: Secondary | ICD-10-CM | POA: Diagnosis not present

## 2023-06-23 DIAGNOSIS — G9340 Encephalopathy, unspecified: Secondary | ICD-10-CM | POA: Diagnosis not present

## 2023-06-23 MED ORDER — ENSURE PLUS HIGH PROTEIN PO LIQD: 237.0000 mL | Freq: Two times a day (BID) | ORAL | Status: AC

## 2023-06-23 MED ORDER — ENSURE PLUS HIGH PROTEIN PO LIQD
237.0000 mL | Freq: Two times a day (BID) | ORAL | Status: DC
  Administered 2023-06-23: 237 mL via ORAL

## 2023-06-23 NOTE — Telephone Encounter (Signed)
 1. Which medications need refilled? (List name and dosage, if known) carbidopa -levodopa  - has a few days left.  2. Which pharmacy/location is medication to be sent to? (include street and city if local pharmacy) Son was not sure which pharmacy she got it from. He said it was some kind of a program she got it from last time.

## 2023-06-23 NOTE — TOC Transition Note (Signed)
 Transition of Care [facility]) - Discharge Note   Patient Details  Name: [name] MRN: [id number] Date of Birth: 04/22/1940  [facility]) CM/SW Contact:  Tandy Fam, LCSW Phone Number: 06/23/2023, 1:55 PM   Clinical Narrative:   Patient has had a PASRR number assigned. CSW updated [facility], confirmed that they are able to admit patient. CSW sent [facility] waiver eligibility to [facility], confirmed room is available. CSW met with patient and DIL at bedside who initially requested PTAR, but then CSW received a call that the family would like to transport instead. CSW updated RN. No further TOC needs at this time.  Nurse to call report to [phone], Room 609.    Final next level of care: Skilled Nursing Facility Barriers to Discharge: Barriers Resolved   Patient Goals and CMS Choice Patient states their goals for this hospitalization and ongoing recovery are:: To return home          Discharge Placement              Patient chooses bed at: [facility] Patient to be transferred to facility by: Family Name of family member notified: Daughter in law Patient and family notified of of transfer: 06/23/23  Discharge Plan and Services Additional resources added to the After Visit Summary for                                       Social Drivers of Health (SDOH) Interventions SDOH Screenings   Food Insecurity: No Food Insecurity (06/14/2023)  Housing: Low Risk  (06/14/2023)  Transportation Needs: No Transportation Needs (06/14/2023)  Utilities: Not At Risk (06/14/2023)  Alcohol Screen: Low Risk  (11/22/2022)  Depression (PHQ2-9): Medium Risk (11/29/2022)  Financial Resource Strain: Low Risk  (11/22/2022)  Physical Activity: Inactive (11/22/2022)  Social Connections: Socially Isolated (06/14/2023)  Stress: No Stress Concern Present (11/22/2022)  Tobacco Use: Low Risk  (06/13/2023)  Health Literacy: Adequate Health Literacy  (11/22/2022)     Readmission Risk Interventions     No data to display

## 2023-06-23 NOTE — Discharge Summary (Addendum)
 Physician Discharge Summary   Patient: [name] MRN: [id number] DOB: 1941-01-13  Admit date:     06/13/2023  Discharge date: 06/23/23  Discharge Physician: Charlean Congress  PCP: Donnie Galea, MD  Recommendations at discharge: Follow-up with PCP in 1 week. Follow-up with neurology as scheduled in June 2025.   Contact information for follow-up providers     Donnie Galea, MD. Schedule an appointment as soon as possible for a visit in 1 week(s).   Specialty: Family Medicine Contact information: [address] [place] [address] [phone]         Tat, Von Grumbling, DO. Go to.   Specialty: Neurology Why: the Appointment As Scheduled Contact information: [address] [place]  [address] [place] [address] [phone]              Contact information for after-discharge care     Destination     [facility] Preferred SNF .   Service: Skilled Nursing Contact information: [address] [place]  [address] [phone]                    Discharge Diagnoses: Principal Problem:   Acute encephalopathy Active Problems:   Breast cancer (HCC)   Parkinson's disease (HCC)   Hallucinations   RBD (REM behavioral disorder)   Depression   Macrocytosis  Hospital Course:  83 y.o. female with history of Parkinson's disease has been having increasing hallucination over the last 2 weeks.  Patient's son provided the history.  Patient's son states that about a week ago patient had severe hallucination and was taken to the ER and at the ER CT head was unremarkable and most of the lab works were unremarkable and was advised to follow-up with neurologist.  Patient follows with her neurologist and patient's amantadine  was discontinued and started on Nuplazid .  So far patient has taken 2 dose of Nuplazid .  As per the son patient is becoming increasingly  confused and not able to recognize patient's son.  Over the last one week patient has been staying at her son's house.  Has not had any nausea vomiting diarrhea chest pain shortness of breath fever chills productive cough.  Patient's son is not sure if she has been taking her medicines as advised previously but while in his house he has she has been taking it regularly.   Patient's significant other has been recently placed in an assisted living facility.  She has had increased confusion in regards to remembering who people are, recognizing people, and hallucinations including seeing people that are not there and most recently on 5/18 her daughter-in-law states that she was seeing bugs on the ground. Patient states her hallucinations are typically worse at night and do improve if the lights are left on, however family notices them during the day as well.   Assessment and plan. Acute metabolic encephalopathy Relative B12 deficiency. Hallucination  history of parkinsonism. Patient was recently started on Nuplazid  for hallucination in the setting of Parkinson's disease. Neurology consulted. Appreciate recommendation. Continue Nuplazid  that was started on 5/16, it may take 6 weeks to see full effect Continue Rytary .  7 AM 11 AM 3 PM and 7 PM. Stay off amantadine . Continue clonazepam . B12 level 392.  Relatively low.  Goal more than 500.  Continue supplementation.  Recheck in 1 month.  REM behavioral disorder-RBD Continued on Klonopin .  Depression No SI. On Effexor .  Sialorrhea  On Xeomin  injections.  Underweight. Failure to thrive. Body mass index is 17.18 kg/m.  Placing the patient at high risk for poor outcome. Would recommend nutritional supplementation.  History of breast cancer. Remote history. Outpatient follow-up as needed  Abnormal TSH. TSH 5.56 , Free T4 0.8.  Recommend recheck outpatient.  Consultants:  Neurology   Procedures performed:  none  DISCHARGE  MEDICATION: Allergies as of 06/23/2023       Reactions   Penicillins Shortness Of Breath, Itching        Medication List     STOP taking these medications    celecoxib  200 MG capsule Commonly known as: CELEBREX        TAKE these medications    aspirin EC 81 MG tablet Take 1 tablet (81 mg total) by mouth daily. Swallow whole.   b complex vitamins capsule Take 1 capsule by mouth daily.   clonazePAM  0.5 MG tablet Commonly known as: KLONOPIN  Take 0.5 tablets (0.25 mg total) by mouth at bedtime.   cyanocobalamin  1000 MCG tablet Take 1 tablet (1,000 mcg total) by mouth daily.   feeding supplement Liqd Take 237 mLs by mouth 2 (two) times daily between meals.   methocarbamol  500 MG tablet Commonly known as: ROBAXIN  TAKE ONE TABLET BY MOUTH EVERY 8 HOURS AS NEEDED FOR MUSCLE SPASMS (SEDATION CAUTION)   multivitamin tablet Take 1 tablet by mouth daily.   Nuplazid  34 MG Caps Generic drug: Pimavanserin  Tartrate Take 1 capsule (34 mg total) by mouth daily.   omeprazole  20 MG capsule Commonly known as: PRILOSEC  TAKE ONE CAPSULE BY MOUTH ONCE DAILY   PROLIA  [place] Inject into the skin every 6 (six) months.   Rytary  36.25-145 MG Cpcr Generic drug: Carbidopa -Levodopa  ER 1 capsule at 7 AM/11 AM/3 PM/7 PM   venlafaxine  XR 37.5 MG 24 hr capsule Commonly known as: EFFEXOR -XR Take 3 capsules (112.5 mg total) by mouth daily with breakfast. What changed:  See the new instructions. Another medication with the same name was removed. Continue taking this medication, and follow the directions you see here.       Disposition: SNF Diet recommendation: Regular diet  Discharge Exam: Vitals:   06/22/23 0747 06/22/23 2121 06/23/23 0443 06/23/23 0726  BP: 137/65 (!) 138/99 (!) 144/71 130/65  Pulse: 74 88 68 78  Resp:  18 18   Temp: (!) 97.3 F (36.3 C) 98 F (36.7 C) 98.7 F (37.1 C) 97.7 F (36.5 C)  TempSrc: Oral Oral Oral Oral  SpO2: 99% 95% 99% 99%  Weight:       Height:       General: Appear in no distress; no visible Abnormal Neck Mass Or lumps, Conjunctiva normal Neurology: alert and oriented to time, place, and person parkinsonian tremors seen.   Filed Weights   06/15/23 0427  Weight: 44 kg   Condition at discharge: stable  The results of significant diagnostics from this hospitalization (including imaging, microbiology, ancillary and laboratory) are listed below for reference.   Imaging Studies: DG Chest Portable 1 View Result Date: 06/13/2023 CLINICAL DATA:  Altered mental status.  Increased confusion. EXAM: PORTABLE CHEST 1 VIEW COMPARISON:  06/04/2023. FINDINGS: Bilateral lungs appear hyperlucent with coarse bronchovascular markings, concerning for underlying COPD. There is a linear opacity overlying the lateral aspect of the left mid lower lung zones, likely external object/skin fold. Bilateral lungs otherwise appear clear. No dense consolidation or lung collapse. Bilateral costophrenic angles are clear. Stable cardio-mediastinal silhouette. No acute osseous abnormalities. Surgical staples noted overlying the  right lower hemithorax. The soft tissues are within normal limits. IMPRESSION: *No active disease. Probable COPD. Electronically Signed   By: Beula Brunswick M.D.   On: 06/13/2023 09:49   CT Head Wo Contrast Result Date: 06/13/2023 CLINICAL DATA:  83 year old female with altered mental status and increased confusion. EXAM: CT HEAD WITHOUT CONTRAST TECHNIQUE: Contiguous axial images were obtained from the base of the skull through the vertex without intravenous contrast. RADIATION DOSE REDUCTION: This exam was performed according to the departmental dose-optimization program which includes automated exposure control, adjustment of the mA and/or kV according to patient size and/or use of iterative reconstruction technique. COMPARISON:  Head CT 06/04/2023. FINDINGS: Brain: Cerebral volume is stable, normal for age. No midline shift,  ventriculomegaly, mass effect, evidence of mass lesion, intracranial hemorrhage or evidence of cortically based acute infarction. Gray-white differentiation is stable and normal for age. Vascular: Calcified atherosclerosis at the skull base. No suspicious intracranial vascular hyperdensity. Skull: Stable and intact. Osteopenia. Scaphocephaly, normal variant. Sinuses/Orbits: Visualized paranasal sinuses and mastoids are stable and well aerated. Other: No acute orbit or scalp soft tissue finding. IMPRESSION: Stable and normal for age noncontrast CT appearance of the brain. Electronically Signed   By: Marlise Simpers M.D.   On: 06/13/2023 09:43   DG Chest 2 View Result Date: 06/04/2023 CLINICAL DATA:  Weakness and altered mental status. EXAM: CHEST - 2 VIEW COMPARISON:  December 22, 2021 FINDINGS: The heart size and mediastinal contours are within normal limits. There is no evidence of an acute infiltrate, pleural effusion or pneumothorax. Radiopaque surgical clips are seen within the soft tissues of the anterior chest wall on the right. Chronic and degenerative changes are seen involving the right shoulder and proximal to mid left clavicle. No acute osseous abnormalities are identified. IMPRESSION: Chronic and postoperative changes without evidence of acute cardiopulmonary disease. Electronically Signed   By: Virgle Grime M.D.   On: 06/04/2023 21:09   CT Head Wo Contrast Result Date: 06/04/2023 CLINICAL DATA:  Head trauma, hallucinations for 1 week, more frequent headaches. EXAM: CT HEAD WITHOUT CONTRAST CT CERVICAL SPINE WITHOUT CONTRAST TECHNIQUE: Multidetector CT imaging of the head and cervical spine was performed following the standard protocol without intravenous contrast. Multiplanar CT image reconstructions of the cervical spine were also generated. RADIATION DOSE REDUCTION: This exam was performed according to the departmental dose-optimization program which includes automated exposure control, adjustment  of the mA and/or kV according to patient size and/or use of iterative reconstruction technique. COMPARISON:  04/20/2023. FINDINGS: CT HEAD FINDINGS Brain: No acute intracranial hemorrhage. No CT evidence of acute infarct. Nonspecific hypoattenuation in the periventricular and subcortical white matter favored to reflect chronic microvascular ischemic changes. No edema, mass effect, or midline shift. The basilar cisterns are patent. Ventricles: The ventricles are normal. Vascular: Atherosclerotic calcifications of the carotid siphons and intracranial vertebral arteries. No hyperdense vessel. Skull: No acute or aggressive finding. Orbits: Orbits are symmetric. Sinuses: The visualized paranasal sinuses are clear. Other: Mastoid air cells are clear. CT CERVICAL SPINE FINDINGS Alignment: Cervical lordosis is maintained. Trace anterolisthesis of C2 on C3. Trace retrolisthesis of C3 on C4, C4 on C5. Additional trace anterolisthesis C6 on C7 and C7 on T1. No facet subluxation or dislocation. Skull base and vertebrae: No acute fracture. No primary bone lesion or focal pathologic process. Anterior endplate osteophytes at multiple levels most pronounced at C3-4 and C4-5. Soft tissues and spinal canal: No prevertebral fluid or swelling. No visible canal hematoma. Disc levels: Intervertebral disc space narrowing  at multiple levels greatest at C3-4. Disc osteophyte complexes at multiple levels in the cervical spine. There is mild-to-moderate spinal canal stenosis at C4-5. Facet arthrosis and uncovertebral hypertrophy at multiple levels. Foraminal narrowing most pronounced on the right at C4-5. Upper chest: Negative. Other: None. IMPRESSION: No CT evidence of acute intracranial abnormality. No acute fracture of the cervical spine. Similar listhesis at multiple levels likely related to chronic facet degenerative changes. Electronically Signed   By: Denny Flack M.D.   On: 06/04/2023 20:07   CT Cervical Spine Wo Contrast Result  Date: 06/04/2023 CLINICAL DATA:  Head trauma, hallucinations for 1 week, more frequent headaches. EXAM: CT HEAD WITHOUT CONTRAST CT CERVICAL SPINE WITHOUT CONTRAST TECHNIQUE: Multidetector CT imaging of the head and cervical spine was performed following the standard protocol without intravenous contrast. Multiplanar CT image reconstructions of the cervical spine were also generated. RADIATION DOSE REDUCTION: This exam was performed according to the departmental dose-optimization program which includes automated exposure control, adjustment of the mA and/or kV according to patient size and/or use of iterative reconstruction technique. COMPARISON:  04/20/2023. FINDINGS: CT HEAD FINDINGS Brain: No acute intracranial hemorrhage. No CT evidence of acute infarct. Nonspecific hypoattenuation in the periventricular and subcortical white matter favored to reflect chronic microvascular ischemic changes. No edema, mass effect, or midline shift. The basilar cisterns are patent. Ventricles: The ventricles are normal. Vascular: Atherosclerotic calcifications of the carotid siphons and intracranial vertebral arteries. No hyperdense vessel. Skull: No acute or aggressive finding. Orbits: Orbits are symmetric. Sinuses: The visualized paranasal sinuses are clear. Other: Mastoid air cells are clear. CT CERVICAL SPINE FINDINGS Alignment: Cervical lordosis is maintained. Trace anterolisthesis of C2 on C3. Trace retrolisthesis of C3 on C4, C4 on C5. Additional trace anterolisthesis C6 on C7 and C7 on T1. No facet subluxation or dislocation. Skull base and vertebrae: No acute fracture. No primary bone lesion or focal pathologic process. Anterior endplate osteophytes at multiple levels most pronounced at C3-4 and C4-5. Soft tissues and spinal canal: No prevertebral fluid or swelling. No visible canal hematoma. Disc levels: Intervertebral disc space narrowing at multiple levels greatest at C3-4. Disc osteophyte complexes at multiple levels  in the cervical spine. There is mild-to-moderate spinal canal stenosis at C4-5. Facet arthrosis and uncovertebral hypertrophy at multiple levels. Foraminal narrowing most pronounced on the right at C4-5. Upper chest: Negative. Other: None. IMPRESSION: No CT evidence of acute intracranial abnormality. No acute fracture of the cervical spine. Similar listhesis at multiple levels likely related to chronic facet degenerative changes. Electronically Signed   By: Denny Flack M.D.   On: 06/04/2023 20:07    Microbiology: Results for orders placed or performed in visit on 01/30/18  Urine Culture     Status: None   Collection Time: 01/30/18  1:11 PM   Specimen: Urine  Result Value Ref Range Status   MICRO NUMBER: [id number]  Final   SPECIMEN QUALITY: Adequate  Final   Sample Source URINE  Final   STATUS: FINAL  Final   Result: No Growth  Final   *Note: Due to a large number of results and/or encounters for the requested time period, some results have not been displayed. A complete set of results can be found in Results Review.   Labs: CBC: Recent Labs  Lab 06/20/23 0517 06/21/23 0440 06/22/23 0503  WBC 4.2 4.3 4.3  NEUTROABS 1.8 1.8 2.0  HGB 11.8* 11.6* 11.9*  HCT 36.9 35.9* 36.1  MCV 103.7* 101.7* 102.6*  PLT 193 198 201  Basic Metabolic Panel: Recent Labs  Lab 06/20/23 0517 06/21/23 0440 06/22/23 0503  NA 141 140 138  K 4.0 4.2 4.4  CL 106 105 105  CO2 26 29 28   GLUCOSE 95 101* 91  BUN 17 19 18   CREATININE 0.62 0.63 0.71  CALCIUM 8.5* 8.8* 8.4*   Liver Function Tests: No results for input(s): "AST", "ALT", "ALKPHOS", "BILITOT", "PROT", "ALBUMIN" in the last 168 hours. CBG: Recent Labs  Lab 06/19/23 0803 06/19/23 1127 06/19/23 1202  GLUCAP 269* 210* 96    Discharge time spent: greater than 30 minutes.  Author: Charlean Congress, MD  [facility]

## 2023-06-24 ENCOUNTER — Other Ambulatory Visit: Payer: Self-pay

## 2023-06-24 NOTE — Telephone Encounter (Signed)
 Pt's son called back in. He called the number he was given for [facility], but they did not have any record for the patient

## 2023-06-27 ENCOUNTER — Encounter: Payer: Self-pay | Admitting: Internal Medicine

## 2023-06-27 ENCOUNTER — Telehealth: Payer: Self-pay | Admitting: Neurology

## 2023-06-27 ENCOUNTER — Telehealth: Payer: Self-pay

## 2023-06-27 ENCOUNTER — Other Ambulatory Visit (HOSPITAL_COMMUNITY): Payer: Self-pay

## 2023-06-27 NOTE — Telephone Encounter (Signed)
 Pharmacy Patient Advocate Encounter   Received notification from Pt Calls Messages that prior authorization for Nuplazid  34MG  capsules is required/requested.   Insurance verification completed.   The patient is insured through [facility] .   Per test claim: PA required; PA submitted to above mentioned insurance via CoverMyMeds Key/confirmation #/EOC [id number] Status is pending

## 2023-06-27 NOTE — Telephone Encounter (Signed)
 Patient son called back and states that the medication is on the way he just wanted you to know

## 2023-06-27 NOTE — Telephone Encounter (Signed)
 Nuplazib Aproved 06/27/23-06/26/24

## 2023-06-28 ENCOUNTER — Telehealth: Payer: Self-pay

## 2023-06-28 NOTE — Progress Notes (Signed)
 Care Guide Pharmacy Note  06/28/2023 Name: [name] MRN: [id number] DOB: Jun 28, 1940  Referred By: Donnie Galea, MD Reason for referral: Complex Care Management (Outreach to schedule with Pharm d and LCSW)   [name] is a 83 y.o. year old female who is a primary care patient of Donnie Galea, MD.  [name] was referred to the pharmacist for assistance related to: HLD  Successful contact was made with the patient to discuss pharmacy services including being ready for the pharmacist to call at least 5 minutes before the scheduled appointment time and to have medication bottles and any blood pressure readings ready for review. The patient agreed to meet with the pharmacist via telephone visit on (date/time).07/06/2023   Lenton Rail , RMA     [place]  [facility] Guide  Direct Dial: [phone]  Website: [email or web address]

## 2023-06-28 NOTE — Progress Notes (Signed)
 Complex Care Management Note  Care Guide Note 06/28/2023 Name: [name] MRN: [id number] DOB: Jun 10, 1940  [name] is a 83 y.o. year old female who sees Donnie Galea, MD for primary care. I reached out to [facility] by phone today to offer complex care management services.  [name] was given information about Complex Care Management services today including:   The Complex Care Management services include support from the care team which includes your Nurse Care Manager, Clinical Social Worker, or Pharmacist.  The Complex Care Management team is here to help remove barriers to the health concerns and goals most important to you. Complex Care Management services are voluntary, and the patient may decline or stop services at any time by request to their care team member.   Complex Care Management Consent Status: Patient agreed to services and verbal consent obtained.   Follow up plan:  Telephone appointment with complex care management team member scheduled for:  06/29/2023  Encounter Outcome:  Patient Scheduled  Lenton Rail , RMA     [place]  [facility], [name] Guide  Direct Dial: [phone]  Website: [email or web address]

## 2023-06-29 ENCOUNTER — Other Ambulatory Visit: Payer: Self-pay | Admitting: Licensed Clinical Social Worker

## 2023-06-29 DIAGNOSIS — E538 Deficiency of other specified B group vitamins: Secondary | ICD-10-CM | POA: Diagnosis not present

## 2023-06-29 DIAGNOSIS — F32A Depression, unspecified: Secondary | ICD-10-CM | POA: Diagnosis not present

## 2023-06-29 DIAGNOSIS — Z853 Personal history of malignant neoplasm of breast: Secondary | ICD-10-CM | POA: Diagnosis not present

## 2023-06-29 DIAGNOSIS — G4752 REM sleep behavior disorder: Secondary | ICD-10-CM | POA: Diagnosis not present

## 2023-06-29 DIAGNOSIS — G20B1 Parkinson's disease with dyskinesia, without mention of fluctuations: Secondary | ICD-10-CM | POA: Diagnosis not present

## 2023-06-29 DIAGNOSIS — K117 Disturbances of salivary secretion: Secondary | ICD-10-CM | POA: Diagnosis not present

## 2023-06-29 DIAGNOSIS — E038 Other specified hypothyroidism: Secondary | ICD-10-CM | POA: Diagnosis not present

## 2023-06-29 DIAGNOSIS — G9341 Metabolic encephalopathy: Secondary | ICD-10-CM | POA: Diagnosis not present

## 2023-06-29 DIAGNOSIS — R627 Adult failure to thrive: Secondary | ICD-10-CM | POA: Diagnosis not present

## 2023-06-29 NOTE — Patient Instructions (Signed)
 Visit Information  Thank you for taking time to visit with me today. Please don't hesitate to contact me if I can be of assistance to you before our next scheduled appointment.  Your next care management appointment is by telephone on 07/13/2023 at 9am  Telephone follow up appointment date/time:  07/13/23 9am  Please call the care guide team at [phone] if you need to cancel, schedule, or reschedule an appointment.   Please call 911 if you are experiencing a Mental Health or Behavioral Health Crisis or need someone to talk to.  Fletcher Humble MSW, LCSW Licensed Clinical Social Worker  [facility], Population Health Direct Dial: [phone]  Fax: [phone]

## 2023-06-29 NOTE — Patient Outreach (Signed)
 Complex Care Management   Visit Note  06/29/2023  Name:  [name] MRN: [id number] DOB: 1940-04-01  Situation: Referral received for Complex Care Management related to caregiver options and support I obtained verbal consent from Caregiver.  Visit completed with [name] pt son   on the phone. Pt's son reports [name] is currently in short term rehab and is exploring community options and support to assist once pt is discharged.   Background:   Past Medical History:  Diagnosis Date   Allergy    SEASONAL   Anemia    Anemia-NOS / PMH, Dr Marguerita Shih   Arthritis    Breast cancer (HCC)    stage I right breast   Cataract    BILATERAL   Decreased vision    R eye   Esophageal Stricture    Hyperlipidemia    Monoclonal gammopathy    MVP (mitral valve prolapse)    Osteoporosis    Parkinson disease (HCC)    Skin cancer    basal cell L neck   Vertical diplopia     Assessment: Patient Reported Symptoms:  Cognitive Cognitive Status: Unable to Assess      Neurological Neurological Review of Symptoms: No symptoms reported Neurological Conditions: Parkinson's disease  HEENT HEENT Symptoms Reported: No symptoms reported      Cardiovascular Cardiovascular Symptoms Reported: No symptoms reported    Respiratory Respiratory Symptoms Reported: No symptoms reported    Endocrine Patient reports the following symptoms related to hypoglycemia or hyperglycemia : No symptoms reported Is patient diabetic?: No    Gastrointestinal Gastrointestinal Symptoms Reported: No symptoms reported      Genitourinary Genitourinary Symptoms Reported: No symptoms reported    Integumentary Integumentary Symptoms Reported: No symptoms reported    Musculoskeletal Musculoskelatal Symptoms Reviewed: No symptoms reported   Falls in the past year?: Yes Number of falls in past year: 1 or less Was there an injury with Fall?: No Fall Risk Category Calculator: 1 Patient Fall Risk Level: Low Fall Risk     Psychosocial Psychosocial Symptoms Reported: Not assessed     Quality of Family Relationships: helpful, involved, supportive Do you feel physically threatened by others?: No      11/29/2022    9:25 AM  Depression screen PHQ 2/9  Decreased Interest 0  Down, Depressed, Hopeless 0  PHQ - 2 Score 0  Altered sleeping 3  Tired, decreased energy 3  Change in appetite 0  Feeling bad or failure about yourself  0  Trouble concentrating 0  Moving slowly or fidgety/restless 2  Suicidal thoughts 0  PHQ-9 Score 8  Difficult doing work/chores Somewhat difficult    There were no vitals filed for this visit.  Medications Reviewed Today     Reviewed by Fletcher Humble, LCSW (Social Worker) on 06/29/23 at 1048  Med List Status: <None>   Medication Order Taking? Sig Documenting Provider Last Dose Status Informant  aspirin  EC 81 MG tablet [id number]  Take 1 tablet (81 mg total) by mouth daily. Swallow whole. Samtani, Jai-Gurmukh, MD  Active   b complex vitamins capsule [id number]  Take 1 capsule by mouth daily. Kraig Peru, MD  Active   Carbidopa -Levodopa  ER (RYTARY ) 36.25-145 [facility] CPCR [id number] No 1 capsule at 7 AM/11 AM/3 PM/7 PM Tat, Von Grumbling, DO 06/13/2023 Morning Active Pharmacy Records, Child           Med Note (COFFELL, Stan Eans   Tue Jun 14, 2023 11:46 AM) No fill history found for this  medication. Pt's son states he brought this medication and Nuplazid  to be dispensed from home supply. Same medications are now on pt's MAR - able to confirm pt is taking both.  clonazePAM  (KLONOPIN ) 0.5 MG tablet [id number]  Take 0.5 tablets (0.25 mg total) by mouth at bedtime. Samtani, Jai-Gurmukh, MD  Active   cyanocobalamin  1000 MCG tablet [id number]  Take 1 tablet (1,000 mcg total) by mouth daily. Kraig Peru, MD  Active   Denosumab  (PROLIA  [place]) [id number] No Inject into the skin every 6 (six) months. [provider] Unknown Active Pharmacy Records, Child  feeding supplement (ENSURE PLUS HIGH  PROTEIN) LIQD [id number]  Take 237 mLs by mouth 2 (two) times daily between meals. Kraig Peru, MD  Active   incobotulinumtoxinA  (XEOMIN ) 100 units injection 100 Units [id number]   Tat, Von Grumbling, DO  Active   incobotulinumtoxinA  (XEOMIN ) 100 units injection 100 Units [id number]   Tat, Von Grumbling, DO  Active   incobotulinumtoxinA  (XEOMIN ) 100 units injection 100 Units [id number]   Tat, Von Grumbling, DO  Active   methocarbamol  (ROBAXIN ) 500 MG tablet [id number] No TAKE ONE TABLET BY MOUTH EVERY 8 HOURS AS NEEDED FOR MUSCLE SPASMS (SEDATION CAUTION) Donnie Galea, MD Past Month Active Pharmacy Records, Child  Multiple Vitamin (MULTIVITAMIN) tablet [id number] No Take 1 tablet by mouth daily. [provider] Unknown Active Pharmacy Records, Child           Med Note (COFFELL, Stan Eans   Tue Jun 14, 2023 11:42 AM) Pt's son found a multivitamin in pt's car but states he does not give this to the pt but she may take on her own.  omeprazole  (PRILOSEC ) 20 MG capsule [id number] No TAKE ONE CAPSULE BY MOUTH ONCE DAILY Donnie Galea, MD 06/13/2023 Morning Active Pharmacy Records, Child  Pimavanserin  Tartrate (NUPLAZID ) 34 MG CAPS [id number] No Take 1 capsule (34 mg total) by mouth daily. Shirline Dover, DO 06/13/2023 Morning Active Pharmacy Records, Child           Med Note (COFFELL, Stan Eans   Tue Jun 14, 2023 11:47 AM) No fill history found for this medication. Pt's son states he brought this medication and Rytary  to be dispensed from home supply. Same medications are now on pt's MAR - able to confirm pt is taking both.  venlafaxine  XR (EFFEXOR -XR) 37.5 MG 24 hr capsule [id number]  Take 3 capsules (112.5 mg total) by mouth daily with breakfast. Samtani, Jai-Gurmukh, MD  Active             Recommendation:   LCSW A Leita Lindbloom will provide contact information and rates for local pcs agencies. Pt son will contact for scheduling and further assistance. Pt son will contact [facility] [employer] for  assistance with wheelchair ramp   Follow Up Plan:   Telephone follow up appointment date/time:  07/13/2023  Fletcher Humble MSW, LCSW Licensed Clinical Social Worker  [facility], Population Health Direct Dial: [phone]  Fax: [phone]

## 2023-07-01 ENCOUNTER — Other Ambulatory Visit (HOSPITAL_COMMUNITY): Payer: Self-pay

## 2023-07-01 ENCOUNTER — Other Ambulatory Visit: Payer: Self-pay | Admitting: *Deleted

## 2023-07-01 DIAGNOSIS — R627 Adult failure to thrive: Secondary | ICD-10-CM | POA: Diagnosis not present

## 2023-07-01 DIAGNOSIS — K117 Disturbances of salivary secretion: Secondary | ICD-10-CM | POA: Diagnosis not present

## 2023-07-01 DIAGNOSIS — G20B1 Parkinson's disease with dyskinesia, without mention of fluctuations: Secondary | ICD-10-CM | POA: Diagnosis not present

## 2023-07-01 DIAGNOSIS — E538 Deficiency of other specified B group vitamins: Secondary | ICD-10-CM | POA: Diagnosis not present

## 2023-07-01 DIAGNOSIS — G4752 REM sleep behavior disorder: Secondary | ICD-10-CM | POA: Diagnosis not present

## 2023-07-01 DIAGNOSIS — G9341 Metabolic encephalopathy: Secondary | ICD-10-CM | POA: Diagnosis not present

## 2023-07-01 DIAGNOSIS — F32A Depression, unspecified: Secondary | ICD-10-CM | POA: Diagnosis not present

## 2023-07-01 DIAGNOSIS — E038 Other specified hypothyroidism: Secondary | ICD-10-CM | POA: Diagnosis not present

## 2023-07-01 NOTE — Patient Outreach (Signed)
 [name] admitted to [facility] under [facility] waiver. She is currently active with [facility] CCM team.   Secure communication sent to Salita, SNF social worker to inquire about transition plans.   Will continue to follow.   Nolberto Batty, MSN, RN, BSN [place]  [facility], Healthy Communities RN Post- Acute Care Manager Direct Dial: [phone]

## 2023-07-01 NOTE — Telephone Encounter (Signed)
 Pharmacy Patient Advocate Encounter  Received notification from [facility] that Prior Authorization for NUPLAZID  34MG  has been APPROVED from 6.2.25 to 6.2.26. Ran test claim, Copay is [id number]. This test claim was processed through [facility] [address]- copay amounts may vary at other pharmacies due to pharmacy/plan contracts, or as the patient moves through the different stages of their insurance plan.  THIS PATIENT IS LIKELY TO BENEFIT FROM THE MEDICARE PRESCRIPTION PAYMENT PLAN PROGRAM.

## 2023-07-04 ENCOUNTER — Telehealth: Payer: Self-pay | Admitting: Neurology

## 2023-07-04 DIAGNOSIS — K219 Gastro-esophageal reflux disease without esophagitis: Secondary | ICD-10-CM | POA: Diagnosis not present

## 2023-07-04 DIAGNOSIS — R627 Adult failure to thrive: Secondary | ICD-10-CM | POA: Diagnosis not present

## 2023-07-04 DIAGNOSIS — E038 Other specified hypothyroidism: Secondary | ICD-10-CM | POA: Diagnosis not present

## 2023-07-04 DIAGNOSIS — F32A Depression, unspecified: Secondary | ICD-10-CM | POA: Diagnosis not present

## 2023-07-04 DIAGNOSIS — K117 Disturbances of salivary secretion: Secondary | ICD-10-CM | POA: Diagnosis not present

## 2023-07-04 DIAGNOSIS — G20B1 Parkinson's disease with dyskinesia, without mention of fluctuations: Secondary | ICD-10-CM | POA: Diagnosis not present

## 2023-07-04 DIAGNOSIS — G4752 REM sleep behavior disorder: Secondary | ICD-10-CM | POA: Diagnosis not present

## 2023-07-04 DIAGNOSIS — E538 Deficiency of other specified B group vitamins: Secondary | ICD-10-CM | POA: Diagnosis not present

## 2023-07-04 NOTE — Telephone Encounter (Signed)
 Pt son called informed Let us  know if it becomes problematic.  If we back down on the rytary , she may not move as well. Pt son verbalized understanding and will keep us  posted

## 2023-07-04 NOTE — Telephone Encounter (Signed)
 Spoke with pt son he is not sure if this is new or not she  was living by her self until the past few moths when she went to the facility. While at the facility she has had the heighten sex urge, he said they have noticed little this adding up until she expressed her self and shocked her son and daughter in law. Her increase urge is not causing in problems at the facility. She will be discharged home with family on Wednesday

## 2023-07-04 NOTE — Telephone Encounter (Signed)
 Pt son called they have checked her for a UTI she doesn't have one, she has been having mood swing and an increasing urge for sex. Her son said she does not care who she expresses that urge too and he has no idea where this is coming from ,

## 2023-07-04 NOTE — Telephone Encounter (Signed)
 Pt's son called in this morning and left a message with our after hours service. The son believes the pt is having side effects from her Parkinson's medications. She is confused a lot and talking about sex and provocative behavior. This is out of character for her. She is currently not at home and in a facility for rehab services.

## 2023-07-05 ENCOUNTER — Other Ambulatory Visit: Payer: Self-pay | Admitting: *Deleted

## 2023-07-05 ENCOUNTER — Telehealth: Payer: Self-pay

## 2023-07-05 DIAGNOSIS — K117 Disturbances of salivary secretion: Secondary | ICD-10-CM | POA: Diagnosis not present

## 2023-07-05 DIAGNOSIS — G4752 REM sleep behavior disorder: Secondary | ICD-10-CM | POA: Diagnosis not present

## 2023-07-05 DIAGNOSIS — R627 Adult failure to thrive: Secondary | ICD-10-CM | POA: Diagnosis not present

## 2023-07-05 DIAGNOSIS — E038 Other specified hypothyroidism: Secondary | ICD-10-CM | POA: Diagnosis not present

## 2023-07-05 DIAGNOSIS — G9341 Metabolic encephalopathy: Secondary | ICD-10-CM | POA: Diagnosis not present

## 2023-07-05 DIAGNOSIS — E538 Deficiency of other specified B group vitamins: Secondary | ICD-10-CM | POA: Diagnosis not present

## 2023-07-05 DIAGNOSIS — G20B1 Parkinson's disease with dyskinesia, without mention of fluctuations: Secondary | ICD-10-CM | POA: Diagnosis not present

## 2023-07-05 NOTE — Telephone Encounter (Signed)
 I looked at the discharge list and the current list in the EMR.  This looks accurate.  If she has been seen at an outside clinic and they didn't send the records, I wouldn't know of any potential changes there.  Please send the current list.  Thanks.

## 2023-07-05 NOTE — Telephone Encounter (Signed)
 Can you review the med list to make sure no changes and I will print and mail.

## 2023-07-05 NOTE — Telephone Encounter (Signed)
 Copied from CRM [id number]. Topic: General - Other >> Jul 05, 2023  9:55 AM Jenna Moan wrote: Reason for CRM: Tre from [employer] would like a updated med list, Son came to pharmacy stating some medications had changed fax over [phone]

## 2023-07-05 NOTE — Patient Outreach (Signed)
 Post- Acute Care Manager follow up. Mrs. Pitner resides in [facility]. [facility] waiver was utilized for admission.   Telephone call received from [place] social worker. Judith Novak states she has been informed by therapy, Mrs. Bow wants to discharge tomorrow 07/06/23. Senita reports she is going to speak with Mrs. Wieman and son today and will Chief Financial Officer on transition plans/date.   Will continue to follow. Will update [facility] CCM team as appropriate.   Nolberto Batty, MSN, RN, BSN [place]  [facility], Healthy Communities RN Post- Acute Care Manager Direct Dial: [phone]

## 2023-07-05 NOTE — Telephone Encounter (Signed)
Document has been faxed. 

## 2023-07-06 ENCOUNTER — Encounter: Payer: Self-pay | Admitting: Family Medicine

## 2023-07-06 ENCOUNTER — Other Ambulatory Visit: Admitting: Pharmacist

## 2023-07-06 DIAGNOSIS — G9341 Metabolic encephalopathy: Secondary | ICD-10-CM | POA: Diagnosis not present

## 2023-07-06 DIAGNOSIS — Z853 Personal history of malignant neoplasm of breast: Secondary | ICD-10-CM | POA: Diagnosis not present

## 2023-07-06 DIAGNOSIS — E038 Other specified hypothyroidism: Secondary | ICD-10-CM | POA: Diagnosis not present

## 2023-07-06 DIAGNOSIS — R627 Adult failure to thrive: Secondary | ICD-10-CM | POA: Diagnosis not present

## 2023-07-06 DIAGNOSIS — F32A Depression, unspecified: Secondary | ICD-10-CM | POA: Diagnosis not present

## 2023-07-06 DIAGNOSIS — G4752 REM sleep behavior disorder: Secondary | ICD-10-CM | POA: Diagnosis not present

## 2023-07-06 DIAGNOSIS — G20A1 Parkinson's disease without dyskinesia, without mention of fluctuations: Secondary | ICD-10-CM

## 2023-07-06 DIAGNOSIS — K219 Gastro-esophageal reflux disease without esophagitis: Secondary | ICD-10-CM | POA: Diagnosis not present

## 2023-07-06 DIAGNOSIS — K117 Disturbances of salivary secretion: Secondary | ICD-10-CM | POA: Diagnosis not present

## 2023-07-06 DIAGNOSIS — E538 Deficiency of other specified B group vitamins: Secondary | ICD-10-CM | POA: Diagnosis not present

## 2023-07-06 DIAGNOSIS — G20B1 Parkinson's disease with dyskinesia, without mention of fluctuations: Secondary | ICD-10-CM | POA: Diagnosis not present

## 2023-07-06 NOTE — Progress Notes (Signed)
 07/06/2023 Name: [name] MRN: [id number] DOB: [birth year]  Subjective  Chief Complaint  Patient presents with   Medication Reconcilliation    Care Team: Primary Care Provider: Donnie Galea, MD  Reason for visit: [name] is a 83 y.o. female who's Son/caregiver presents today for a phone visit with the pharmacist for a medication review/reconciliation visit.   Medication Access/Adherence: ?  Prescription drug coverage: Payor: MEDICARE / Plan: MEDICARE PART A AND B / Product Type: *No Product type* / .   - Reports that all medications are affordable: Receives Rytary  via patient assistance program Is on the wait list for Nuplazid  assistance (they were told approval usually takes ~2 weeks). Has been using samples in the meantime. Has a supply at home currently.  - Patient manages their own medications/refills: No . Patient's son manages medications   HPI: Patient currently has [name]'s medication list that was provided at discharge from the SNF earlier today.   Son has no concerns today, other than confusion with discharge list vs Mychart list. Would like to ensure correct medications are given to her now that she is home.   Son also reports that patient is Lactose intolerant - she was prescribed a milk based ensure protein drink and he feels this has caused her GI upset when she does take it.    Outpatient Encounter Medications as of 07/06/2023  Medication Sig Note   aspirin  EC 81 MG tablet Take 1 tablet (81 mg total) by mouth daily. Swallow whole.    b complex vitamins capsule Take 1 capsule by mouth daily.    Carbidopa -Levodopa  ER (RYTARY ) 36.25-145 MG CPCR 1 capsule at 7 AM/11 AM/3 PM/7 PM    clonazePAM  (KLONOPIN ) 0.5 MG tablet Take 0.5 tablets (0.25 mg total) by mouth at bedtime.    cyanocobalamin  1000 MCG tablet Take 1 tablet (1,000 mcg total) by mouth daily.    Denosumab  (PROLIA  [place]) Inject into the skin every 6 (six) months.    feeding supplement  (ENSURE PLUS HIGH PROTEIN) LIQD Take 237 mLs by mouth 2 (two) times daily between meals.    methocarbamol  (ROBAXIN ) 500 MG tablet TAKE ONE TABLET BY MOUTH EVERY 8 HOURS AS NEEDED FOR MUSCLE SPASMS (SEDATION CAUTION)    Multiple Vitamin (MULTIVITAMIN) tablet Take 1 tablet by mouth daily.    omeprazole  (PRILOSEC ) 20 MG capsule TAKE ONE CAPSULE BY MOUTH ONCE DAILY    Pimavanserin  Tartrate (NUPLAZID ) 34 MG CAPS Take 1 capsule (34 mg total) by mouth daily.    venlafaxine  XR (EFFEXOR -XR) 37.5 MG 24 hr capsule Take 3 capsules (112.5 mg total) by mouth daily with breakfast.     Assessment and Plan:   Medication Access SNF discharge MAR was reviewed with patient/son. Printed SNF discharge list is consistent with Epic list. All medications, doses and administration instructions are consistent.  Current medication list was faxed to pharmacy yesterday by CMA per request of patient/son. Son verbalizes understanding.  All medications are affordable. States they have already been assisted with Nuplazid  patient assistance. Approval is pending. Advised he reach out to prescriber if getting low on pills and not yet approved at that time.   Son will reach out if any questions/concerns arise, or if issues at the pharmacy with refills prior to Hospital follow up visit.   Future Appointments  Date Time Provider Department Center  07/13/2023  9:00 AM Fletcher Humble, LCSW [facility]POP[facility] None  07/22/2023 10:15 AM Tat, Von Grumbling, DO LBN-LB[facility] None  07/22/2023  2:00  PM Donnie Galea, MD [facility] P[facility]  10/04/2023  8:15 AM Tat, Von Grumbling, DO LBN-[facility] None  11/03/2023  9:00 AM [facility]ON[facility] LAB [facility]MED[facility] None  11/03/2023  9:45 AM [facility] MED[facility]C FLUSH [facility]MED[facility] None  11/10/2023  8:30 AM Heilingoetter, Cassandra L, PA-C [facility]MEDON[facility] None   Daron Ellen, PharmD Clinical Pharmacist [facility] [phone]

## 2023-07-07 ENCOUNTER — Other Ambulatory Visit: Payer: Self-pay | Admitting: Pharmacist

## 2023-07-07 ENCOUNTER — Telehealth: Payer: Self-pay | Admitting: Family Medicine

## 2023-07-07 ENCOUNTER — Telehealth: Payer: Self-pay

## 2023-07-07 DIAGNOSIS — F324 Major depressive disorder, single episode, in partial remission: Secondary | ICD-10-CM | POA: Diagnosis not present

## 2023-07-07 DIAGNOSIS — M810 Age-related osteoporosis without current pathological fracture: Secondary | ICD-10-CM | POA: Diagnosis not present

## 2023-07-07 DIAGNOSIS — E785 Hyperlipidemia, unspecified: Secondary | ICD-10-CM | POA: Diagnosis not present

## 2023-07-07 DIAGNOSIS — Z853 Personal history of malignant neoplasm of breast: Secondary | ICD-10-CM | POA: Diagnosis not present

## 2023-07-07 DIAGNOSIS — R636 Underweight: Secondary | ICD-10-CM | POA: Diagnosis not present

## 2023-07-07 DIAGNOSIS — Z7982 Long term (current) use of aspirin: Secondary | ICD-10-CM | POA: Diagnosis not present

## 2023-07-07 DIAGNOSIS — R627 Adult failure to thrive: Secondary | ICD-10-CM | POA: Diagnosis not present

## 2023-07-07 DIAGNOSIS — F02A2 Dementia in other diseases classified elsewhere, mild, with psychotic disturbance: Secondary | ICD-10-CM | POA: Diagnosis not present

## 2023-07-07 DIAGNOSIS — F02A18 Dementia in other diseases classified elsewhere, mild, with other behavioral disturbance: Secondary | ICD-10-CM | POA: Diagnosis not present

## 2023-07-07 DIAGNOSIS — D519 Vitamin B12 deficiency anemia, unspecified: Secondary | ICD-10-CM | POA: Diagnosis not present

## 2023-07-07 DIAGNOSIS — G4752 REM sleep behavior disorder: Secondary | ICD-10-CM | POA: Diagnosis not present

## 2023-07-07 DIAGNOSIS — E038 Other specified hypothyroidism: Secondary | ICD-10-CM | POA: Diagnosis not present

## 2023-07-07 DIAGNOSIS — F02A3 Dementia in other diseases classified elsewhere, mild, with mood disturbance: Secondary | ICD-10-CM | POA: Diagnosis not present

## 2023-07-07 DIAGNOSIS — D7589 Other specified diseases of blood and blood-forming organs: Secondary | ICD-10-CM | POA: Diagnosis not present

## 2023-07-07 DIAGNOSIS — G9341 Metabolic encephalopathy: Secondary | ICD-10-CM | POA: Diagnosis not present

## 2023-07-07 DIAGNOSIS — K117 Disturbances of salivary secretion: Secondary | ICD-10-CM | POA: Diagnosis not present

## 2023-07-07 DIAGNOSIS — K219 Gastro-esophageal reflux disease without esophagitis: Secondary | ICD-10-CM | POA: Diagnosis not present

## 2023-07-07 DIAGNOSIS — G20B1 Parkinson's disease with dyskinesia, without mention of fluctuations: Secondary | ICD-10-CM | POA: Diagnosis not present

## 2023-07-07 NOTE — Telephone Encounter (Signed)
 Copied from CRM [id number]. Topic: General - Other >> Jul 07, 2023 11:04 AM Earnestine Goes B wrote: Reason for CRM: pt called to speak with the pharmacist,  requesting a call back at [phone]

## 2023-07-07 NOTE — Progress Notes (Signed)
 Brief Telephone Documentation Reason for Call: Patient left message regarding question for pharmacist  Summary of Call: Called and spoke with patient's son.   He reports that patient has been established and well-controled on venlafaxine  for years (on 37 mg tablet + 112.5 mg tablet for total dose of 149.5 mg daily).   During recent SNF stay, to simplify her regimen, they had her taking 75 mg tablets x3 per day. Son reports that her insurance is not covering this, though pharmacist states they do cover dosing at it was prescribed previously.   Will forward to PCP. Reasonable to resume previous dose.   Follow Up: Patient given direct line for further questions/concerns.  Daron Ellen, PharmD Clinical Pharmacist [facility] [phone]

## 2023-07-07 NOTE — Transitions of Care (Post Inpatient/ED Visit) (Signed)
   07/07/2023  Name: [name] MRN: [id number] DOB: 1940-03-18  Today's TOC FU Call Status: Today's TOC FU Call Status:: Unsuccessful Call (1st Attempt) Unsuccessful Call (1st Attempt) Date: 07/07/23  Attempted to reach the patient regarding the most recent Inpatient/ED visit.  Follow Up Plan: Additional outreach attempts will be made to reach the patient to complete the Transitions of Care (Post Inpatient/ED visit) call.   Signature Darrall Ellison, LPN [facility] Nurse Health Advisor Direct Dial [phone]

## 2023-07-08 NOTE — Transitions of Care (Post Inpatient/ED Visit) (Signed)
 07/08/2023  Name: [name] MRN: [id number] DOB: February 01, 1940  Today's TOC FU Call Status: Today's TOC FU Call Status:: Successful TOC FU Call Completed Unsuccessful Call (1st Attempt) Date: 07/07/23 [facility] FU Call Complete Date: 07/08/23 Patient's Name and Date of Birth confirmed.  Transition Care Management Follow-up Telephone Call Date of Discharge: 07/06/23 Discharge Facility: Other [facility]) Name of Other (Non[facility]) Discharge Facility: [facility] Type of Discharge: Inpatient Admission Primary Inpatient Discharge Diagnosis:: failure to thrive How have you been since you were released from the hospital?: Better Any questions or concerns?: No  Items Reviewed: Did you receive and understand the discharge instructions provided?: Yes Medications obtained,verified, and reconciled?: Yes (Medications Reviewed) Any new allergies since your discharge?: No Dietary orders reviewed?: Yes Do you have support at home?: Yes People in Home [RPT]: child(ren), adult  Medications Reviewed Today: Medications Reviewed Today     Reviewed by Darrall Ellison, LPN (Licensed Practical Nurse) on 07/08/23 at 1229  Med List Status: <None>   Medication Order Taking? Sig Documenting Provider Last Dose Status Informant  aspirin  EC 81 MG tablet [id number] Yes Take 1 tablet (81 mg total) by mouth daily. Swallow whole. Samtani, Jai-Gurmukh, MD  Active   b complex vitamins capsule [id number] Yes Take 1 capsule by mouth daily. Kraig Peru, MD  Active   Carbidopa -Levodopa  ER (RYTARY ) 36.25-145 MG CPCR [id number] Yes 1 capsule at 7 AM/11 AM/3 PM/7 PM Tat, Von Grumbling, DO  Active Pharmacy Records, Child           Med Note Talbot Factor, [place] R   Wed Jul 06, 2023  2:33 PM)    clonazePAM  (KLONOPIN ) 0.5 MG tablet [id number] Yes Take 0.5 tablets (0.25 mg total) by mouth at bedtime. Samtani, Jai-Gurmukh, MD  Active   cyanocobalamin  1000 MCG tablet [id number] Yes Take 1 tablet (1,000 mcg total) by mouth daily.  Kraig Peru, MD  Active   Denosumab  (PROLIA  [place]) [id number] Yes Inject into the skin every 6 (six) months. [provider]  Active Pharmacy Records, Child  feeding supplement (ENSURE PLUS HIGH PROTEIN) LIQD [id number] Yes Take 237 mLs by mouth 2 (two) times daily between meals. Kraig Peru, MD  Active   incobotulinumtoxinA  (XEOMIN ) 100 units injection 100 Units [id number]   Tat, Von Grumbling, DO  Active   incobotulinumtoxinA  (XEOMIN ) 100 units injection 100 Units [id number]   Tat, Von Grumbling, DO  Active   incobotulinumtoxinA  (XEOMIN ) 100 units injection 100 Units [id number]   Tat, Von Grumbling, DO  Active   methocarbamol  (ROBAXIN ) 500 MG tablet [id number] Yes TAKE ONE TABLET BY MOUTH EVERY 8 HOURS AS NEEDED FOR MUSCLE SPASMS (SEDATION CAUTION) Donnie Galea, MD  Active Pharmacy Records, Child  Multiple Vitamin (MULTIVITAMIN) tablet [id number] Yes Take 1 tablet by mouth daily. [provider]  Active Pharmacy Records, Child           Med Note (COFFELL, Stan Eans   Tue Jun 14, 2023 11:42 AM) Pt's son found a multivitamin in pt's car but states he does not give this to the pt but she may take on her own.  omeprazole  (PRILOSEC ) 20 MG capsule [id number] Yes TAKE ONE CAPSULE BY MOUTH ONCE DAILY Donnie Galea, MD  Active Pharmacy Records, Child  Pimavanserin  Tartrate (NUPLAZID ) 34 MG CAPS [id number] Yes Take 1 capsule (34 mg total) by mouth daily. Tat, Von Grumbling, DO  Active Pharmacy Records, Child           Med Note [place], [place] R  Wed Jul 06, 2023  2:34 PM)    venlafaxine  XR (EFFEXOR -XR) 37.5 MG 24 hr capsule [id number] Yes Take 3 capsules (112.5 mg total) by mouth daily with breakfast. Samtani, Jai-Gurmukh, MD  Active             Home Care and Equipment/Supplies:    Functional Questionnaire: Do you need assistance with bathing/showering or dressing?: No Do you need assistance with meal preparation?: No Do you need assistance with eating?: No Do you have difficulty maintaining  continence: No Do you need assistance with getting out of bed/getting out of a chair/moving?: No Do you have difficulty managing or taking your medications?: No  Follow up appointments reviewed: PCP Follow-up appointment confirmed?: Yes Date of PCP follow-up appointment?: 07/22/23 Follow-up Provider: Vallarie Gauze (staff schedule this appt, patient didn't want to change) Specialist Hospital Follow-up appointment confirmed?: NA Do you need transportation to your follow-up appointment?: No Do you understand care options if your condition(s) worsen?: Yes-patient verbalized understanding    SIGNATURE Darrall Ellison, LPN [facility] Nurse Health Advisor Direct Dial [phone]

## 2023-07-11 ENCOUNTER — Other Ambulatory Visit: Payer: Self-pay | Admitting: *Deleted

## 2023-07-11 NOTE — Telephone Encounter (Signed)
 FYI   Returned call to patient son who is on the [place]. Recently on Mothers Day he had to take her to the Emergency Room she was very confused and hallucinating. She has since continued to get worse that she can not remember some things. Not even who he was. So on the 18th they took her back to the ED and they admitted her for about 10 days. She eventually went to rehab and was released to the care of his sister. She has become argumentative but showing some signs of improvement. She is at the point where she really can not do alot for herself. She will not use her walker but still falls at time. Since March 26 she has been to the hospital at least 3 times. He states that everytime they have had to taken her to the ED something traumatic has happened.  Him and his sister believe that sometimes she shows good judgement and often times not. He just wanted you to be aware prior to her appointment because he did not want to discuss this in front of her to upset her more. He is not asking for anything to be done. He just wanted you to be well aware of all things.

## 2023-07-11 NOTE — Patient Outreach (Unsigned)
 Post- Acute Care Manager follow up. Verified in [facility] [name] discharged from [facility] on 07/06/23. [facility] home health arranged.   [name] is already active with [facility] CCM team.   Nolberto Batty, MSN, RN, BSN [place]  [facility], Healthy Communities RN Post- Acute Care Manager Direct Dial: [phone]

## 2023-07-11 NOTE — Telephone Encounter (Unsigned)
 Copied from CRM [id number]. Topic: General - Other >> Jul 11, 2023 11:04 AM Jenice Mitts wrote: Reason for CRM: Patients son Rosalva Comber is calling because he would like to go over some concerns about his mom before her appointment on 6/27. He doesn't want to discuss it in front of her  He can be reached at [phone]

## 2023-07-12 DIAGNOSIS — F02A2 Dementia in other diseases classified elsewhere, mild, with psychotic disturbance: Secondary | ICD-10-CM | POA: Diagnosis not present

## 2023-07-12 DIAGNOSIS — F02A18 Dementia in other diseases classified elsewhere, mild, with other behavioral disturbance: Secondary | ICD-10-CM | POA: Diagnosis not present

## 2023-07-12 DIAGNOSIS — G20B1 Parkinson's disease with dyskinesia, without mention of fluctuations: Secondary | ICD-10-CM | POA: Diagnosis not present

## 2023-07-12 DIAGNOSIS — G9341 Metabolic encephalopathy: Secondary | ICD-10-CM | POA: Diagnosis not present

## 2023-07-12 DIAGNOSIS — F324 Major depressive disorder, single episode, in partial remission: Secondary | ICD-10-CM | POA: Diagnosis not present

## 2023-07-12 DIAGNOSIS — F02A3 Dementia in other diseases classified elsewhere, mild, with mood disturbance: Secondary | ICD-10-CM | POA: Diagnosis not present

## 2023-07-12 NOTE — Telephone Encounter (Signed)
 Noted. Thanks.

## 2023-07-13 ENCOUNTER — Other Ambulatory Visit: Payer: Self-pay | Admitting: Licensed Clinical Social Worker

## 2023-07-13 NOTE — Patient Outreach (Signed)
 Complex Care Management   Visit Note  07/13/2023  Name:  [name] MRN: [id number] DOB: 1940/05/22  Situation: Referral received for Complex Care Management related to caregiver support I obtained verbal consent from Caregiver.  Visit completed with [name]  on the phone [name] reports pt is discharged from snf and currently residing with his sister in [place] . According to [name], pt daughter is retired and has resources to care for patient.  Background:   Past Medical History:  Diagnosis Date   Allergy    SEASONAL   Anemia    Anemia-NOS / PMH, [name]   Arthritis    Breast cancer (HCC)    stage I right breast   Cataract    BILATERAL   Decreased vision    R eye   Esophageal Stricture    Hyperlipidemia    Monoclonal gammopathy    MVP (mitral valve prolapse)    Osteoporosis    Parkinson disease (HCC)    Skin cancer    basal cell L neck   Vertical diplopia     Assessment: Patient Reported Symptoms:  Cognitive        Neurological      HEENT        Cardiovascular      Respiratory      Endocrine      Gastrointestinal        Genitourinary      Integumentary      Musculoskeletal          Psychosocial       Quality of Family Relationships: involved, supportive, helpful Do you feel physically threatened by others?: No      11/29/2022    9:25 AM  Depression screen PHQ 2/9  Decreased Interest 0  Down, Depressed, Hopeless 0  PHQ - 2 Score 0  Altered sleeping 3  Tired, decreased energy 3  Change in appetite 0  Feeling bad or failure about yourself  0  Trouble concentrating 0  Moving slowly or fidgety/restless 2  Suicidal thoughts 0  PHQ-9 Score 8  Difficult doing work/chores Somewhat difficult    There were no vitals filed for this visit.  Medications Reviewed Today   Medications were not reviewed in this encounter     Recommendation:   Continue Current Plan of Care  Follow Up Plan:   Patient has met all  care management goals. Care Management case will be closed. Patient has been provided contact information should new needs arise.   [name] MSW, LCSW Licensed Clinical Social Worker  [facility], Population Health Direct Dial: [phone]  Fax: [phone]

## 2023-07-13 NOTE — Patient Instructions (Signed)

## 2023-07-18 DIAGNOSIS — F02A18 Dementia in other diseases classified elsewhere, mild, with other behavioral disturbance: Secondary | ICD-10-CM | POA: Diagnosis not present

## 2023-07-18 DIAGNOSIS — F02A2 Dementia in other diseases classified elsewhere, mild, with psychotic disturbance: Secondary | ICD-10-CM | POA: Diagnosis not present

## 2023-07-18 DIAGNOSIS — G9341 Metabolic encephalopathy: Secondary | ICD-10-CM | POA: Diagnosis not present

## 2023-07-18 DIAGNOSIS — F324 Major depressive disorder, single episode, in partial remission: Secondary | ICD-10-CM | POA: Diagnosis not present

## 2023-07-18 DIAGNOSIS — F02A3 Dementia in other diseases classified elsewhere, mild, with mood disturbance: Secondary | ICD-10-CM | POA: Diagnosis not present

## 2023-07-18 DIAGNOSIS — G20B1 Parkinson's disease with dyskinesia, without mention of fluctuations: Secondary | ICD-10-CM | POA: Diagnosis not present

## 2023-07-19 ENCOUNTER — Other Ambulatory Visit: Payer: Self-pay

## 2023-07-19 ENCOUNTER — Telehealth: Payer: Self-pay | Admitting: Neurology

## 2023-07-19 DIAGNOSIS — R443 Hallucinations, unspecified: Secondary | ICD-10-CM

## 2023-07-19 MED ORDER — NUPLAZID 34 MG PO CAPS: 1.0000 | ORAL_CAPSULE | Freq: Every day | ORAL | 1 refills | Status: DC

## 2023-07-19 NOTE — Telephone Encounter (Signed)
 On Dr. Martie desk for signature

## 2023-07-19 NOTE — Telephone Encounter (Signed)
 Caller needs hard copy of Rx Nuplazid  sent to fax [phone]

## 2023-07-21 ENCOUNTER — Telehealth: Payer: Self-pay | Admitting: Neurology

## 2023-07-21 ENCOUNTER — Other Ambulatory Visit: Payer: Self-pay | Admitting: Family Medicine

## 2023-07-21 DIAGNOSIS — F02A18 Dementia in other diseases classified elsewhere, mild, with other behavioral disturbance: Secondary | ICD-10-CM | POA: Diagnosis not present

## 2023-07-21 DIAGNOSIS — F324 Major depressive disorder, single episode, in partial remission: Secondary | ICD-10-CM | POA: Diagnosis not present

## 2023-07-21 DIAGNOSIS — G9341 Metabolic encephalopathy: Secondary | ICD-10-CM | POA: Diagnosis not present

## 2023-07-21 DIAGNOSIS — F02A2 Dementia in other diseases classified elsewhere, mild, with psychotic disturbance: Secondary | ICD-10-CM | POA: Diagnosis not present

## 2023-07-21 DIAGNOSIS — F02A3 Dementia in other diseases classified elsewhere, mild, with mood disturbance: Secondary | ICD-10-CM | POA: Diagnosis not present

## 2023-07-21 DIAGNOSIS — G20B1 Parkinson's disease with dyskinesia, without mention of fluctuations: Secondary | ICD-10-CM | POA: Diagnosis not present

## 2023-07-21 NOTE — Telephone Encounter (Signed)
 Pt's son called stating his mother is out of a medication tat prescribed. She took her last one today. Needs a refill until they can get it approved through insurance or get help for it Unsure what it is called

## 2023-07-21 NOTE — Telephone Encounter (Signed)
 Called [employer] and they are trying to get meds Expedited overnight but it has to go through management first. Called patients son and let him know to answer phone and that they are trying ot get meds to them overnight

## 2023-07-22 ENCOUNTER — Ambulatory Visit (INDEPENDENT_AMBULATORY_CARE_PROVIDER_SITE_OTHER): Admitting: Family Medicine

## 2023-07-22 ENCOUNTER — Encounter: Payer: Self-pay | Admitting: Family Medicine

## 2023-07-22 ENCOUNTER — Ambulatory Visit (INDEPENDENT_AMBULATORY_CARE_PROVIDER_SITE_OTHER): Payer: Medicare Other | Admitting: Neurology

## 2023-07-22 VITALS — BP 116/60 | HR 92 | Temp 99.5°F | Resp 20 | Ht 63.0 in | Wt 104.4 lb

## 2023-07-22 DIAGNOSIS — G20A1 Parkinson's disease without dyskinesia, without mention of fluctuations: Secondary | ICD-10-CM

## 2023-07-22 DIAGNOSIS — D649 Anemia, unspecified: Secondary | ICD-10-CM | POA: Diagnosis not present

## 2023-07-22 DIAGNOSIS — M542 Cervicalgia: Secondary | ICD-10-CM

## 2023-07-22 DIAGNOSIS — K117 Disturbances of salivary secretion: Secondary | ICD-10-CM | POA: Diagnosis not present

## 2023-07-22 MED ORDER — VENLAFAXINE HCL ER 37.5 MG PO CP24: 37.5000 mg | ORAL_CAPSULE | Freq: Every day | ORAL | Status: DC

## 2023-07-22 MED ORDER — CELECOXIB 200 MG PO CAPS: 200.0000 mg | ORAL_CAPSULE | Freq: Every morning | ORAL | 3 refills | Status: AC

## 2023-07-22 MED ORDER — VENLAFAXINE HCL ER 75 MG PO CP24: 75.0000 mg | ORAL_CAPSULE | Freq: Every day | ORAL | 3 refills | Status: AC

## 2023-07-22 MED ORDER — INCOBOTULINUMTOXINA 100 UNITS IM SOLR
100.0000 [IU] | INTRAMUSCULAR | Status: AC
  Administered 2023-07-22: 100 [IU] via INTRAMUSCULAR

## 2023-07-22 NOTE — Patient Instructions (Signed)
 Methocarbamol  for neck muscle spasms.  Restart celebrex  for joint pain.  Effexor  75 + 37.5mg  Go to the lab on the way out.   If you have mychart we'll likely use that to update you.    Take care.  Glad to see you.

## 2023-07-22 NOTE — Progress Notes (Unsigned)
 She had botox tx this AM.    Neck pain, at the B occiput.    Knee pain.  Off celebrex  and more knee pain in the meantime.   Anemia with B12 pending.    No numbness or tingling.    Taking pill with applesauce.    Falls d/w pt.  Clemens about 1 week ago. That was at [facility].  Back home with family now.    Had to change effexor  to 75 + 37.5mg  due to coverage.  Couldn't get 37.5mg  x3 filled.   Daughter with colon cancer, d/w pt.  She had surgery and is going through chemotherapy.    R lateral knee pain.

## 2023-07-22 NOTE — Procedures (Signed)
 Botulinum Clinic   History:  Diagnosis: Sialorrhea associated with PD (icd10: K11.7) Results:  helps   Informed consent was obtained.  The patient was educated on the botulinum toxin the black blox warning and given a copy of the botox patient medication guide.  The patient understands that this warning states that there have been reported cases of the Botox extending beyond the injection site and creating adverse effects, similar to those of botulism. This included loss of strength, trouble walking, hoarseness, trouble saying words clearly, loss of bladder control, trouble breathing, trouble swallowing, diplopia, blurry vision and ptosis. Most of the distant spread of Botox was happening in patients, primarily children, who received medication for spasticity or for cervical dystonia. The patient expressed understanding and desire to proceed.   Injections  Location Left  Right Units Number of sites  Parotid (point 1 on picture) 30 30 60 1 per side  Submandibular (point 2) 20 20 40 1 per side  TOTAL UNITS:   100    Type of Toxin: Xeomin Discarded Units: 0  Needle drawback with each injection was free of blood. Pt tolerated procedure well without complications.   Reinjection is anticipated in 4 months.

## 2023-07-23 LAB — CBC WITH DIFFERENTIAL/PLATELET
Absolute Lymphocytes: 1994 {cells}/uL (ref 850–3900)
Absolute Monocytes: 400 {cells}/uL (ref 200–950)
Basophils Absolute: 41 {cells}/uL (ref 0–200)
Basophils Relative: 0.6 %
Eosinophils Absolute: 131 {cells}/uL (ref 15–500)
Eosinophils Relative: 1.9 %
HCT: 36.3 % (ref 35.0–45.0)
Hemoglobin: 12.1 g/dL (ref 11.7–15.5)
MCH: 33.2 pg — ABNORMAL HIGH (ref 27.0–33.0)
MCHC: 33.3 g/dL (ref 32.0–36.0)
MCV: 99.5 fL (ref 80.0–100.0)
MPV: 9.7 fL (ref 7.5–12.5)
Monocytes Relative: 5.8 %
Neutro Abs: 4333 {cells}/uL (ref 1500–7800)
Neutrophils Relative %: 62.8 %
Platelets: 265 10*3/uL (ref 140–400)
RBC: 3.65 10*6/uL — ABNORMAL LOW (ref 3.80–5.10)
RDW: 12.4 % (ref 11.0–15.0)
Total Lymphocyte: 28.9 %
WBC: 6.9 10*3/uL (ref 3.8–10.8)

## 2023-07-23 LAB — BASIC METABOLIC PANEL WITH GFR
BUN: 22 mg/dL (ref 7–25)
CO2: 25 mmol/L (ref 20–32)
Calcium: 9.2 mg/dL (ref 8.6–10.4)
Chloride: 103 mmol/L (ref 98–110)
Creat: 0.71 mg/dL (ref 0.60–0.95)
Glucose, Bld: 142 mg/dL — ABNORMAL HIGH (ref 65–99)
Potassium: 4.3 mmol/L (ref 3.5–5.3)
Sodium: 140 mmol/L (ref 135–146)
eGFR: 85 mL/min/{1.73_m2} (ref >=60)

## 2023-07-23 LAB — VITAMIN B12: Vitamin B-12: >2000 pg/mL — ABNORMAL HIGH (ref 200–1100)

## 2023-07-24 ENCOUNTER — Ambulatory Visit: Payer: Self-pay | Admitting: Family Medicine

## 2023-07-24 ENCOUNTER — Other Ambulatory Visit: Payer: Self-pay | Admitting: Family Medicine

## 2023-07-24 MED ORDER — VITAMIN B-12 250 MCG PO TABS: 250.0000 ug | ORAL_TABLET | Freq: Every day | ORAL | Status: AC

## 2023-07-24 NOTE — Assessment & Plan Note (Signed)
 Likely from muscle spasms. Discussed options.  Can restart methocarbamol  for neck muscle spasms.  Restart celebrex  for joint pain.  Hopefully that will help her neck and her knee pain.

## 2023-07-24 NOTE — Assessment & Plan Note (Signed)
 Anemia with B12 pending.  See notes on labs. No numbness or tingling.  See notes on labs.

## 2023-07-24 NOTE — Assessment & Plan Note (Signed)
 Had neurology follow-up, had Botox today.  Her mentation appears to be improving.  Discussed risk for hallucinations in the setting of acute illness/hospitalization.  She has been taken Nuplazid  in the meantime, would continue.  Would continue Effexor  with 37.5+75 mg/day since she could not get 3 of the 37.5 mg/day.

## 2023-07-25 DIAGNOSIS — F324 Major depressive disorder, single episode, in partial remission: Secondary | ICD-10-CM | POA: Diagnosis not present

## 2023-07-25 DIAGNOSIS — G20B1 Parkinson's disease with dyskinesia, without mention of fluctuations: Secondary | ICD-10-CM | POA: Diagnosis not present

## 2023-07-25 DIAGNOSIS — F02A3 Dementia in other diseases classified elsewhere, mild, with mood disturbance: Secondary | ICD-10-CM | POA: Diagnosis not present

## 2023-07-25 DIAGNOSIS — F02A18 Dementia in other diseases classified elsewhere, mild, with other behavioral disturbance: Secondary | ICD-10-CM | POA: Diagnosis not present

## 2023-07-25 DIAGNOSIS — G9341 Metabolic encephalopathy: Secondary | ICD-10-CM | POA: Diagnosis not present

## 2023-07-25 DIAGNOSIS — F02A2 Dementia in other diseases classified elsewhere, mild, with psychotic disturbance: Secondary | ICD-10-CM | POA: Diagnosis not present

## 2023-07-27 ENCOUNTER — Other Ambulatory Visit: Payer: Self-pay | Admitting: Family Medicine

## 2023-07-28 DIAGNOSIS — G9341 Metabolic encephalopathy: Secondary | ICD-10-CM | POA: Diagnosis not present

## 2023-07-28 DIAGNOSIS — F02A2 Dementia in other diseases classified elsewhere, mild, with psychotic disturbance: Secondary | ICD-10-CM | POA: Diagnosis not present

## 2023-07-28 DIAGNOSIS — G20B1 Parkinson's disease with dyskinesia, without mention of fluctuations: Secondary | ICD-10-CM | POA: Diagnosis not present

## 2023-07-28 DIAGNOSIS — F02A18 Dementia in other diseases classified elsewhere, mild, with other behavioral disturbance: Secondary | ICD-10-CM | POA: Diagnosis not present

## 2023-07-28 DIAGNOSIS — F324 Major depressive disorder, single episode, in partial remission: Secondary | ICD-10-CM | POA: Diagnosis not present

## 2023-07-28 DIAGNOSIS — F02A3 Dementia in other diseases classified elsewhere, mild, with mood disturbance: Secondary | ICD-10-CM | POA: Diagnosis not present

## 2023-08-03 DIAGNOSIS — G9341 Metabolic encephalopathy: Secondary | ICD-10-CM | POA: Diagnosis not present

## 2023-08-03 DIAGNOSIS — F02A18 Dementia in other diseases classified elsewhere, mild, with other behavioral disturbance: Secondary | ICD-10-CM | POA: Diagnosis not present

## 2023-08-03 DIAGNOSIS — F02A3 Dementia in other diseases classified elsewhere, mild, with mood disturbance: Secondary | ICD-10-CM | POA: Diagnosis not present

## 2023-08-03 DIAGNOSIS — F02A2 Dementia in other diseases classified elsewhere, mild, with psychotic disturbance: Secondary | ICD-10-CM | POA: Diagnosis not present

## 2023-08-03 DIAGNOSIS — G20B1 Parkinson's disease with dyskinesia, without mention of fluctuations: Secondary | ICD-10-CM | POA: Diagnosis not present

## 2023-08-03 DIAGNOSIS — F324 Major depressive disorder, single episode, in partial remission: Secondary | ICD-10-CM | POA: Diagnosis not present

## 2023-08-04 DIAGNOSIS — F02A3 Dementia in other diseases classified elsewhere, mild, with mood disturbance: Secondary | ICD-10-CM | POA: Diagnosis not present

## 2023-08-04 DIAGNOSIS — G9341 Metabolic encephalopathy: Secondary | ICD-10-CM | POA: Diagnosis not present

## 2023-08-04 DIAGNOSIS — F324 Major depressive disorder, single episode, in partial remission: Secondary | ICD-10-CM | POA: Diagnosis not present

## 2023-08-04 DIAGNOSIS — F02A2 Dementia in other diseases classified elsewhere, mild, with psychotic disturbance: Secondary | ICD-10-CM | POA: Diagnosis not present

## 2023-08-04 DIAGNOSIS — G20B1 Parkinson's disease with dyskinesia, without mention of fluctuations: Secondary | ICD-10-CM | POA: Diagnosis not present

## 2023-08-04 DIAGNOSIS — F02A18 Dementia in other diseases classified elsewhere, mild, with other behavioral disturbance: Secondary | ICD-10-CM | POA: Diagnosis not present

## 2023-08-06 DIAGNOSIS — F324 Major depressive disorder, single episode, in partial remission: Secondary | ICD-10-CM | POA: Diagnosis not present

## 2023-08-06 DIAGNOSIS — K117 Disturbances of salivary secretion: Secondary | ICD-10-CM | POA: Diagnosis not present

## 2023-08-06 DIAGNOSIS — G9341 Metabolic encephalopathy: Secondary | ICD-10-CM | POA: Diagnosis not present

## 2023-08-06 DIAGNOSIS — E038 Other specified hypothyroidism: Secondary | ICD-10-CM | POA: Diagnosis not present

## 2023-08-06 DIAGNOSIS — K219 Gastro-esophageal reflux disease without esophagitis: Secondary | ICD-10-CM | POA: Diagnosis not present

## 2023-08-06 DIAGNOSIS — F02A2 Dementia in other diseases classified elsewhere, mild, with psychotic disturbance: Secondary | ICD-10-CM | POA: Diagnosis not present

## 2023-08-06 DIAGNOSIS — M810 Age-related osteoporosis without current pathological fracture: Secondary | ICD-10-CM | POA: Diagnosis not present

## 2023-08-06 DIAGNOSIS — Z7982 Long term (current) use of aspirin: Secondary | ICD-10-CM | POA: Diagnosis not present

## 2023-08-06 DIAGNOSIS — F02A3 Dementia in other diseases classified elsewhere, mild, with mood disturbance: Secondary | ICD-10-CM | POA: Diagnosis not present

## 2023-08-06 DIAGNOSIS — D519 Vitamin B12 deficiency anemia, unspecified: Secondary | ICD-10-CM | POA: Diagnosis not present

## 2023-08-06 DIAGNOSIS — F02A18 Dementia in other diseases classified elsewhere, mild, with other behavioral disturbance: Secondary | ICD-10-CM | POA: Diagnosis not present

## 2023-08-06 DIAGNOSIS — R627 Adult failure to thrive: Secondary | ICD-10-CM | POA: Diagnosis not present

## 2023-08-06 DIAGNOSIS — G20B1 Parkinson's disease with dyskinesia, without mention of fluctuations: Secondary | ICD-10-CM | POA: Diagnosis not present

## 2023-08-06 DIAGNOSIS — Z853 Personal history of malignant neoplasm of breast: Secondary | ICD-10-CM | POA: Diagnosis not present

## 2023-08-06 DIAGNOSIS — D7589 Other specified diseases of blood and blood-forming organs: Secondary | ICD-10-CM | POA: Diagnosis not present

## 2023-08-06 DIAGNOSIS — G4752 REM sleep behavior disorder: Secondary | ICD-10-CM | POA: Diagnosis not present

## 2023-08-06 DIAGNOSIS — E785 Hyperlipidemia, unspecified: Secondary | ICD-10-CM | POA: Diagnosis not present

## 2023-08-06 DIAGNOSIS — R636 Underweight: Secondary | ICD-10-CM | POA: Diagnosis not present

## 2023-08-11 DIAGNOSIS — F02A3 Dementia in other diseases classified elsewhere, mild, with mood disturbance: Secondary | ICD-10-CM | POA: Diagnosis not present

## 2023-08-11 DIAGNOSIS — G9341 Metabolic encephalopathy: Secondary | ICD-10-CM | POA: Diagnosis not present

## 2023-08-11 DIAGNOSIS — K117 Disturbances of salivary secretion: Secondary | ICD-10-CM | POA: Diagnosis not present

## 2023-08-11 DIAGNOSIS — F324 Major depressive disorder, single episode, in partial remission: Secondary | ICD-10-CM | POA: Diagnosis not present

## 2023-08-11 DIAGNOSIS — D519 Vitamin B12 deficiency anemia, unspecified: Secondary | ICD-10-CM | POA: Diagnosis not present

## 2023-08-11 DIAGNOSIS — G4752 REM sleep behavior disorder: Secondary | ICD-10-CM | POA: Diagnosis not present

## 2023-08-11 DIAGNOSIS — M810 Age-related osteoporosis without current pathological fracture: Secondary | ICD-10-CM | POA: Diagnosis not present

## 2023-08-11 DIAGNOSIS — E038 Other specified hypothyroidism: Secondary | ICD-10-CM | POA: Diagnosis not present

## 2023-08-11 DIAGNOSIS — D7589 Other specified diseases of blood and blood-forming organs: Secondary | ICD-10-CM | POA: Diagnosis not present

## 2023-08-11 DIAGNOSIS — F02A18 Dementia in other diseases classified elsewhere, mild, with other behavioral disturbance: Secondary | ICD-10-CM | POA: Diagnosis not present

## 2023-08-11 DIAGNOSIS — G20B1 Parkinson's disease with dyskinesia, without mention of fluctuations: Secondary | ICD-10-CM | POA: Diagnosis not present

## 2023-08-11 DIAGNOSIS — F02A2 Dementia in other diseases classified elsewhere, mild, with psychotic disturbance: Secondary | ICD-10-CM | POA: Diagnosis not present

## 2023-08-16 ENCOUNTER — Other Ambulatory Visit: Payer: Self-pay | Admitting: Neurology

## 2023-08-16 DIAGNOSIS — F330 Major depressive disorder, recurrent, mild: Secondary | ICD-10-CM

## 2023-08-16 NOTE — Telephone Encounter (Signed)
 Dr. Evonnie it shows in last visit that you have sent Clonazepam  for this patient but it appears in the pharmacy report it was prescribed by another doctor

## 2023-09-14 ENCOUNTER — Other Ambulatory Visit: Payer: Self-pay | Admitting: Family Medicine

## 2023-09-14 NOTE — Telephone Encounter (Signed)
 Too soon. Per chart, rx sent 07/22/23, #90/3 refills to [facility].   Request denied.

## 2023-09-16 ENCOUNTER — Telehealth: Payer: Self-pay | Admitting: Neurology

## 2023-09-16 NOTE — Telephone Encounter (Signed)
 Jeoffrey MANO with [facility] pharmacy called and LM with AN. She is requesting a refill on pt's Rytary  36.25-145mg   Call back number is [phone] ext [phone]

## 2023-09-16 NOTE — Telephone Encounter (Signed)
 Called and faxed refill for this patient

## 2023-09-30 NOTE — Progress Notes (Signed)
 Assessment/Plan:    1.  Parkinsons Disease with dyskinesia and memory change  -Currently on Rytary  patient assistance.  She will continue Rytary  145 mg, 1 capsule at 7 AM/11 AM/3 PM/7 PM.  She used to be in 195 mg but seems to be doing well on 145 mg currently  - Amantadine  has been discontinued   2.  Sialorrhea             -back on xeomin  injections and she reports good efficacy and is happy with that   3.  RBD             -Continue clonazepam , 0.5 mg, half tablet at bedtime.               -Use bed rails.   4.  Constipation with what sounds like overflow incontinence             -saw GI NP - Treatment with benefiber recommended and helping   5.  Depression             -On venlafaxine .  6.  Parkinsons hallucinations  -markedly improved with nuplazid , 34 mg daily.  Discussed r/b/se including the black box warning.    Subjective:   [name] was seen today in follow up for Parkinsons disease.  No one accompanies the patient today.  Last visit, we also initiated Nuplazid .  About 3 days after that visit she ended up in the emergency room for confusion and hallucinations (which was the reason she was put on Nuplazid  a few days prior).  Neurology did see the patient while she was admitted and noted on admission consultation that the patient was alert and oriented x 4.  Her workup was ultimately negative and patient was discharged to SNF for rehab.  She reports that she has some hallucinations but not like before.  She was having hallucinations of dead people and they were all over my house but now she will have an occasional hallucination but the people are not dead.  She actually had a hallucination of her daughter - she didn't look right but she wasn't scary.    Current prescribed movement disorder medications: Rytary  145 mg at 7 AM/11 AM/3 PM/7 PM Nuplazid , 34 mg daily (started last visit) Amantadine , 100 mg tid Clonazepam  0.5 mg, half tablet at bed   PREVIOUS  MEDICATIONS: has clonazepam  but rarely takes; Rytary  (helped to decrease dyskinesia and had same amount of on time but was too costly)  ALLERGIES:   Allergies  Allergen Reactions   Penicillins Shortness Of Breath and Itching   Lactose Intolerance (Gi)     Lactose intolerant    CURRENT MEDICATIONS:  Outpatient Encounter Medications as of 10/04/2023  Medication Sig   aspirin  EC 81 MG tablet Take 1 tablet (81 mg total) by mouth daily. Swallow whole.   b complex vitamins capsule Take 1 capsule by mouth daily.   Carbidopa -Levodopa  ER (RYTARY ) 36.25-145 MG CPCR 1 capsule at 7 AM/11 AM/3 PM/7 PM   celecoxib  (CELEBREX ) 200 MG capsule Take 1 capsule (200 mg total) by mouth every morning.   clonazePAM  (KLONOPIN ) 0.5 MG tablet TAKE 1/2 TABLET BY MOUTH AT BEDTIME   cyanocobalamin  (CYANOCOBALAMIN ) 250 MCG tablet Take 1 tablet (250 mcg total) by mouth daily.   Denosumab  (PROLIA  [place]) Inject into the skin every 6 (six) months.   feeding supplement (ENSURE PLUS HIGH PROTEIN) LIQD Take 237 mLs by mouth 2 (two) times daily between meals.   methocarbamol  (ROBAXIN ) 500  MG tablet TAKE ONE TABLET BY MOUTH EVERY 8 HOURS AS NEEDED FOR MUSCLE SPASMS (SEDATION CAUTION)   Multiple Vitamin (MULTIVITAMIN) tablet Take 1 tablet by mouth daily.   omeprazole  (PRILOSEC ) 20 MG capsule TAKE ONE CAPSULE BY MOUTH ONCE DAILY   Pimavanserin  Tartrate (NUPLAZID ) 34 MG CAPS Take 1 capsule (34 mg total) by mouth daily.   venlafaxine  XR (EFFEXOR -XR) 37.5 MG 24 hr capsule TAKE ONE CAPSULE BY MOUTH EVERY MORNING with breakfast   venlafaxine  XR (EFFEXOR -XR) 75 MG 24 hr capsule Take 1 capsule (75 mg total) by mouth daily. Take with 1 of the 37.5mg  capsules.   Facility-Administered Encounter Medications as of 10/04/2023  Medication   incobotulinumtoxinA  (XEOMIN ) 100 units injection 100 Units   incobotulinumtoxinA  (XEOMIN ) 100 units injection 100 Units   incobotulinumtoxinA  (XEOMIN ) 100 units injection 100 Units   incobotulinumtoxinA   (XEOMIN ) 100 units injection 100 Units    Objective:   PHYSICAL EXAMINATION:    VITALS:   Vitals:   10/04/23 0754  BP: (!) 160/72  Pulse: 89  SpO2: 99%  Weight: 114 lb (51.7 kg)  Height: 5' 3 (1.6 m)      GEN:  The patient appears stated age and is in NAD. HEENT:  Normocephalic, atraumatic.  The mucous membranes are moist. The superficial temporal arteries are without ropiness or tenderness. CV:  RRR Lungs:  CTAB  Neurological examination:  Orientation: The patient is alert and oriented x3. Cranial nerves: There is good facial symmetry with facial hypomimia. There is R esotropia but it fluctuates (denies diplopia).  The speech is fluent and hypophonic. Soft palate rises symmetrically and there is no tongue deviation. Hearing is intact to conversational tone. Sensation: Sensation is intact to light touch throughout Motor: Strength is at least antigravity x4.  Movement examination: Tone: There is nl tone in the UE/LE Abnormal movements: there is LUE rest tremor.  There is no dyskinesia Coordination:  There is decremation with hand opening and closing on the L and toe taps on the L Gait and Station: The patient pushes off to arise.  She ambulates well with her walker  Total time spent on today's visit was 30 minutes, including both face-to-face time and nonface-to-face time.  Time included that spent on review of records (prior notes available to me/labs/imaging if pertinent), discussing treatment and goals, answering patient's questions and coordinating care.  Cc:  Cleatus Arlyss RAMAN, MD

## 2023-10-02 NOTE — Progress Notes (Unsigned)
 Megan Vanecek T. Delitha Elms, MD, CAQ Sports Medicine [facility] [address] [place], [address]  Phone: [phone]  FAX: [phone]  SEJLA [name] - 83 y.o. female  MRN [id number]  Date of Birth: [birth year]  Date: 10/03/2023  PCP: Cleatus Arlyss RAMAN, MD  Referral: Cleatus Arlyss RAMAN, MD  No chief complaint on file.  Subjective:   [name] is a 83 y.o. very pleasant female patient with There is no height or weight on file to calculate BMI. who presents with the following:  Discussed the use of AI scribe software for clinical note transcription with the patient, who gave verbal consent to proceed.  Patient presents with ongoing knee pain.  I saw her in December 2024 with some right knee pain and arthritis.  When I saw her last, she reported 10 falls in the prior 12 months. History of Present Illness     Review of Systems is noted in the HPI, as appropriate  Objective:   There were no vitals taken for this visit.  GEN: No acute distress; alert,appropriate. PULM: Breathing comfortably in no respiratory distress PSYCH: Normally interactive.   Physical Exam   Laboratory and Imaging Data:  Assessment and Plan:   No diagnosis found. Assessment & Plan   Medication Management during today's office visit: No orders of the defined types were placed in this encounter.  There are no discontinued medications.  Orders placed today for conditions managed today: No orders of the defined types were placed in this encounter.   Disposition: No follow-ups on file.  [employer] One speech-to-text software was used for transcription in this dictation.  Possible transcriptional errors can occur using Animal nutritionist.   Signed,  [name]. Sol Englert, MD   Outpatient Encounter Medications as of 10/03/2023  Medication Sig   aspirin  EC 81 MG tablet Take 1 tablet (81 mg total) by mouth daily. Swallow whole.   b complex vitamins  capsule Take 1 capsule by mouth daily.   Carbidopa -Levodopa  ER (RYTARY ) 36.25-145 MG CPCR 1 capsule at 7 AM/11 AM/3 PM/7 PM   celecoxib  (CELEBREX ) 200 MG capsule Take 1 capsule (200 mg total) by mouth every morning.   clonazePAM  (KLONOPIN ) 0.5 MG tablet TAKE 1/2 TABLET BY MOUTH AT BEDTIME   cyanocobalamin  (CYANOCOBALAMIN ) 250 MCG tablet Take 1 tablet (250 mcg total) by mouth daily.   Denosumab  (PROLIA  [place]) Inject into the skin every 6 (six) months.   feeding supplement (ENSURE PLUS HIGH PROTEIN) LIQD Take 237 mLs by mouth 2 (two) times daily between meals.   methocarbamol  (ROBAXIN ) 500 MG tablet TAKE ONE TABLET BY MOUTH EVERY 8 HOURS AS NEEDED FOR MUSCLE SPASMS (SEDATION CAUTION)   Multiple Vitamin (MULTIVITAMIN) tablet Take 1 tablet by mouth daily.   omeprazole  (PRILOSEC ) 20 MG capsule TAKE ONE CAPSULE BY MOUTH ONCE DAILY   Pimavanserin  Tartrate (NUPLAZID ) 34 MG CAPS Take 1 capsule (34 mg total) by mouth daily.   venlafaxine  XR (EFFEXOR -XR) 37.5 MG 24 hr capsule TAKE ONE CAPSULE BY MOUTH EVERY MORNING with breakfast   venlafaxine  XR (EFFEXOR -XR) 75 MG 24 hr capsule Take 1 capsule (75 mg total) by mouth daily. Take with 1 of the 37.5mg  capsules.   Facility-Administered Encounter Medications as of 10/03/2023  Medication   incobotulinumtoxinA  (XEOMIN ) 100 units injection 100 Units   incobotulinumtoxinA  (XEOMIN ) 100 units injection 100 Units   incobotulinumtoxinA  (XEOMIN ) 100 units injection 100 Units   incobotulinumtoxinA  (XEOMIN ) 100 units injection 100 Units

## 2023-10-03 ENCOUNTER — Ambulatory Visit: Admitting: Family Medicine

## 2023-10-03 ENCOUNTER — Encounter: Payer: Self-pay | Admitting: Family Medicine

## 2023-10-03 VITALS — BP 134/82 | HR 94 | Temp 97.6°F | Ht 63.0 in | Wt 113.0 lb

## 2023-10-03 DIAGNOSIS — G8929 Other chronic pain: Secondary | ICD-10-CM | POA: Diagnosis not present

## 2023-10-03 DIAGNOSIS — M25561 Pain in right knee: Secondary | ICD-10-CM | POA: Diagnosis not present

## 2023-10-03 DIAGNOSIS — G20A1 Parkinson's disease without dyskinesia, without mention of fluctuations: Secondary | ICD-10-CM | POA: Diagnosis not present

## 2023-10-03 DIAGNOSIS — M1711 Unilateral primary osteoarthritis, right knee: Secondary | ICD-10-CM | POA: Diagnosis not present

## 2023-10-03 DIAGNOSIS — R296 Repeated falls: Secondary | ICD-10-CM

## 2023-10-03 MED ORDER — TRIAMCINOLONE ACETONIDE 40 MG/ML IJ SUSP
40.0000 mg | Freq: Once | INTRAMUSCULAR | Status: AC
  Administered 2023-10-03: 40 mg via INTRAMUSCULAR

## 2023-10-04 ENCOUNTER — Encounter: Payer: Self-pay | Admitting: Neurology

## 2023-10-04 ENCOUNTER — Ambulatory Visit (INDEPENDENT_AMBULATORY_CARE_PROVIDER_SITE_OTHER): Admitting: Neurology

## 2023-10-04 VITALS — BP 160/72 | HR 89 | Ht 63.0 in | Wt 114.0 lb

## 2023-10-04 DIAGNOSIS — G20B2 Parkinson's disease with dyskinesia, with fluctuations: Secondary | ICD-10-CM | POA: Diagnosis not present

## 2023-10-04 DIAGNOSIS — G20A1 Parkinson's disease without dyskinesia, without mention of fluctuations: Secondary | ICD-10-CM

## 2023-10-04 DIAGNOSIS — F0282 Dementia in other diseases classified elsewhere, unspecified severity, with psychotic disturbance: Secondary | ICD-10-CM

## 2023-10-04 NOTE — Patient Instructions (Signed)
 Register Today!  We are planning a Parkinsons Disease educational symposium at [facility], [address] [place], [place], [place] [address] on September 19.  We will have a movement disorder physician expert from [employer] coming to speak and a caregiver speaker.  We will have a panel of experts that will show you who you may need on your team of people on your journey with Parkinsons.  I hope to see you there!  Use this QR code to register by scanning it with the camera app on your phone:      Need more help with registration?  Contact [email or web address]

## 2023-10-12 ENCOUNTER — Ambulatory Visit: Payer: Self-pay | Admitting: *Deleted

## 2023-10-12 NOTE — Telephone Encounter (Signed)
  Patient reports knee injection only provide relief or decrease pain for 3 days and then pain and swelling return. Please advise if patient needs to return to Dr. Ubaldo LIPS Only or Action Required?: FYI only for provider.  Patient was last seen in primary care on 10/03/2023 by Watt Mirza, MD.  Called Nurse Triage reporting Knee Pain.  Symptoms began several days ago.  Interventions attempted: Rest, hydration, or home remedies.  Symptoms are: gradually worsening.  Triage Disposition: See PCP When Office is Open (Within 3 Days)  Patient/caregiver understands and will follow disposition?: Yes           Copied from CRM [id number]. Topic: Clinical - Red Word Triage >> Oct 12, 2023 10:56 AM [place] A wrote: [facility] that prompted transfer to Nurse Triage: Patient is having pain in right knee-has has injection but only last for three days Reason for Disposition  [1] MODERATE pain (e.g., interferes with normal activities, limping) AND [2] present > 3 days  Answer Assessment - Initial Assessment Questions Appt schedule tomorrow. Patient reports injection for knee pain only lasting 3 days. Worsening pain with walking. Uses walker. Right knee swelling noted. Recommended cool pack for swelling if needed.        1. LOCATION and RADIATION: Where is the pain located?      Right knee 2. QUALITY: What does the pain feel like?  (e.g., sharp, dull, aching, burning)     It just hurts  3. SEVERITY: How bad is the pain? What does it keep you from doing?   (Scale 1-10; or mild, moderate, severe)     Severe when putting weight on leg 4. ONSET: When did the pain start? Does it come and go, or is it there all the time?     Standing to walk 5. RECURRENT: Have you had this pain before? If Yes, ask: When, and what happened then?     Yes  6. SETTING: Has there been any recent work, exercise or other activity that involved that part of the body?      na 7.  AGGRAVATING FACTORS: What makes the knee pain worse? (e.g., walking, climbing stairs, running)     Walking  8. ASSOCIATED SYMPTOMS: Is there any swelling or redness of the knee?     Swelling noted  9. OTHER SYMPTOMS: Do you have any other symptoms? (e.g., calf pain, chest pain, difficulty breathing, fever)     Right knee pain and swelling , injection last week and only lasting 3 days for pain relief 10. PREGNANCY: Is there any chance you are pregnant? When was your last menstrual period?       na  Protocols used: Knee Pain-A-AH

## 2023-10-13 ENCOUNTER — Encounter: Payer: Self-pay | Admitting: Family Medicine

## 2023-10-13 ENCOUNTER — Ambulatory Visit (INDEPENDENT_AMBULATORY_CARE_PROVIDER_SITE_OTHER): Admitting: Family Medicine

## 2023-10-13 VITALS — BP 128/78 | HR 81 | Temp 98.0°F | Ht 63.0 in | Wt 112.6 lb

## 2023-10-13 DIAGNOSIS — G8929 Other chronic pain: Secondary | ICD-10-CM

## 2023-10-13 DIAGNOSIS — M25569 Pain in unspecified knee: Secondary | ICD-10-CM

## 2023-10-13 DIAGNOSIS — F32A Depression, unspecified: Secondary | ICD-10-CM

## 2023-10-13 DIAGNOSIS — M25561 Pain in right knee: Secondary | ICD-10-CM | POA: Diagnosis not present

## 2023-10-13 MED ORDER — PREDNISONE 20 MG PO TABS: ORAL_TABLET | ORAL | 0 refills | Status: DC

## 2023-10-13 NOTE — Patient Instructions (Signed)
 Hold celebrex  while on prednisone .  Take prednisone  with food.  I'll check with Dr. Watt.  Take care.  Glad to see you.

## 2023-10-13 NOTE — Telephone Encounter (Signed)
Patient will be seen in office today.  

## 2023-10-13 NOTE — Progress Notes (Signed)
 She got a letter about coverage for venlafaxine .  Had to change effexor  to 75 + 37.5mg  due to coverage.  Couldn't get 37.5mg  x3 filled.  Discussed.   I will ask for staff help re: PA process.   R knee pain, no pain sitting but pain with walking.  Using walker at baseline.  R>L knee puffiness.  Pain with R knee extension with weight bearing but not with extension while sitting.  Prev injection only helped for a few days.    Meds, vitals, and allergies reviewed.   ROS: Per HPI unless specifically indicated in ROS section   Nad Ncat Neck supple, no LA Rrr Ctab R knee pain with pain on ROM and joint line ttp.  Walking with limp from pain.

## 2023-10-16 ENCOUNTER — Telehealth: Payer: Self-pay | Admitting: Family Medicine

## 2023-10-16 NOTE — Telephone Encounter (Signed)
 Please check with patient.  If prednisone  is not helping then there may be nonsteroid injections that Dr. Watt could give to the patient.  If needed please get scheduled with him.  If she is going to do that, please send advance notice to Dr. Watt in case he needs to make arrangements prior to the appointment.  Thanks.

## 2023-10-16 NOTE — Assessment & Plan Note (Signed)
 She got a letter about coverage for venlafaxine .  Had to change effexor  to 75 + 37.5mg  due to coverage.  Couldn't get 37.5mg  x3 filled.  Discussed.   I will ask for staff help re: PA process.

## 2023-10-16 NOTE — Assessment & Plan Note (Signed)
 Discussed options.  Hold celebrex  while on prednisone .  Take prednisone  with food.  Steroid cautions discussed. See following phone note.

## 2023-10-17 NOTE — Telephone Encounter (Signed)
 Reached out to patient and she states that although her knee still hurts it feels a little better on the prednisone . When I asked her about the injection she said if you want her to wait and see how the prednisone  does she can or she can come in and see Dr. Watt. She is going to trust your judgement

## 2023-10-17 NOTE — Telephone Encounter (Signed)
 Please update patient.  I think it makes sense to contact Dr. Watt about setting up a visit re: non-steroid injections for knee pain.  I routed this to him for input.  Please see about scheduling with him when possible.    Greatly appreciate help from Dr. Watt.

## 2023-10-18 NOTE — Telephone Encounter (Signed)
 Can you check with the patient and see if she would like for us  to run her viscosupplementation benefits?

## 2023-10-18 NOTE — Telephone Encounter (Signed)
 Left voicemail for patient to return call to office.

## 2023-10-19 NOTE — Telephone Encounter (Signed)
 Not FDA approved to my knowledge.  A few physicians to stem cell injections or plasma injections, but there is limited data that they work, not FDA approved, and insurance will never pay for them.

## 2023-10-19 NOTE — Telephone Encounter (Signed)
 Submission for Orthovisc benefit coverage has been made for gel injection of the right knee.  Will notify the provider when coverage has been verified.

## 2023-10-19 NOTE — Telephone Encounter (Signed)
 Please run visco supplementation benefits.

## 2023-10-19 NOTE — Telephone Encounter (Signed)
 Is there any other non-steroidal injections available other than the viscosupplementatation?

## 2023-10-20 ENCOUNTER — Other Ambulatory Visit (HOSPITAL_COMMUNITY): Payer: Self-pay

## 2023-10-20 ENCOUNTER — Telehealth: Payer: Self-pay

## 2023-10-20 ENCOUNTER — Telehealth: Payer: Self-pay | Admitting: Family Medicine

## 2023-10-20 ENCOUNTER — Encounter: Payer: Self-pay | Admitting: Internal Medicine

## 2023-10-20 NOTE — Telephone Encounter (Signed)
 Error

## 2023-10-20 NOTE — Telephone Encounter (Signed)
 Per test claim medication does not need a prior auth. It's stating refill too soon until 10/26/2023

## 2023-10-20 NOTE — Telephone Encounter (Signed)
 Dr. Cleatus is requesting a prior authorization for venlafaxine  37.5mg . Thank you

## 2023-10-20 NOTE — Telephone Encounter (Signed)
 Patient notified

## 2023-10-21 NOTE — Telephone Encounter (Signed)
 Benefit coverage received: -Deductible applies, Deductible has been met, the patient is responsible for a coinsurance. -PA is not required  Secondary insurance: -The secondary plan follows Medicare guidelines. It will pick up remaining eligible expenses at 100%. It does cover the Medicare Part B deductible.   Benefit coverage forms placed in providers box for review.

## 2023-10-23 NOTE — Telephone Encounter (Signed)
 [name], it looks like she has coverage for Orthovisc.  We would need to schedule her on a Monday or Wednesday when I can see her 3 weeks in succession on that same day of the week.

## 2023-10-24 NOTE — Telephone Encounter (Signed)
 Left message for Ms. Frazee to return call to office to discuss knee injections with Dr. Watt.

## 2023-10-24 NOTE — Telephone Encounter (Signed)
 Spoke with [name] and scheduled her with Dr. Watt on 10/6, 10/13 and 10/20 for Orthovisc Injections.

## 2023-10-30 NOTE — Progress Notes (Unsigned)
 Aarion Metzgar T. Tiphany Fayson, MD, CAQ Sports Medicine [facility] at [facility] [address] [place], [address]  Phone: [phone]  FAX: [phone]  [name] - 83 y.o. female  MRN [id number]  Date of Birth: [birth year]  Date: 10/31/2023  PCP: Cleatus Arlyss RAMAN, MD  Referral: Cleatus Arlyss RAMAN, MD  No chief complaint on file.     ICD-10-CM   1. Primary osteoarthritis of right knee  M17.11      She had only a fair response to intra-articular steroid injection.  We are going to follow-up hyaluronic acid injections today.  Aspiration/Injection Procedure Note [name] 02/27/1940 Date of procedure: 10/31/2023  Procedure: Large Joint Aspiration / Injection of Knee for Viscosupplementation Medication: Monovisc Indications: Pain  Procedure Details Patient verbally consented to procedure. Risks, benefits, and alternatives explained. Sterilely prepped with Chloraprep. Ethyl cholride used for anesthesia, then 7 cc of Lidocaine  1% used for anesthesia in the anteromedial position. Reprepped with Chloraprep.  Anteromedial approach used to inject joint without difficulty, injected with Orthovisc, 2 mL. (30 mg dosage)  No complications with procedure and tolerated well.  Assessment & Plan   Medication Management during today's office visit: No orders of the defined types were placed in this encounter.  There are no discontinued medications.  Orders placed today for conditions managed today: No orders of the defined types were placed in this encounter.   Disposition: No follow-ups on file.  Dragon Medical One speech-to-text software was used for transcription in this dictation.  Possible transcriptional errors can occur using Animal nutritionist.   Signed,  [name]. Rhealyn Cullen, MD   Outpatient Encounter Medications as of 10/31/2023  Medication Sig   aspirin  EC 81 MG tablet Take 1 tablet (81 mg total) by mouth daily. Swallow whole.   b complex  vitamins capsule Take 1 capsule by mouth daily.   Carbidopa -Levodopa  ER (RYTARY ) 36.25-145 MG CPCR 1 capsule at 7 AM/11 AM/3 PM/7 PM   celecoxib  (CELEBREX ) 200 MG capsule Take 1 capsule (200 mg total) by mouth every morning.   clonazePAM  (KLONOPIN ) 0.5 MG tablet TAKE 1/2 TABLET BY MOUTH AT BEDTIME   cyanocobalamin  (CYANOCOBALAMIN ) 250 MCG tablet Take 1 tablet (250 mcg total) by mouth daily.   Denosumab  (PROLIA  [place]) Inject into the skin every 6 (six) months.   feeding supplement (ENSURE PLUS HIGH PROTEIN) LIQD Take 237 mLs by mouth 2 (two) times daily between meals.   methocarbamol  (ROBAXIN ) 500 MG tablet TAKE ONE TABLET BY MOUTH EVERY 8 HOURS AS NEEDED FOR MUSCLE SPASMS (SEDATION CAUTION)   Multiple Vitamin (MULTIVITAMIN) tablet Take 1 tablet by mouth daily.   omeprazole  (PRILOSEC ) 20 MG capsule TAKE ONE CAPSULE BY MOUTH ONCE DAILY   Pimavanserin  Tartrate (NUPLAZID ) 34 MG CAPS Take 1 capsule (34 mg total) by mouth daily.   predniSONE  (DELTASONE ) 20 MG tablet Take 2 a day for 5 days, then 1 a day for 5 days, with food. Don't take with aleve/ibuprofen /celebrex    venlafaxine  XR (EFFEXOR -XR) 37.5 MG 24 hr capsule TAKE ONE CAPSULE BY MOUTH EVERY MORNING with breakfast   venlafaxine  XR (EFFEXOR -XR) 75 MG 24 hr capsule Take 1 capsule (75 mg total) by mouth daily. Take with 1 of the 37.5mg  capsules.   Facility-Administered Encounter Medications as of 10/31/2023  Medication   incobotulinumtoxinA  (XEOMIN ) 100 units injection 100 Units   incobotulinumtoxinA  (XEOMIN ) 100 units injection 100 Units   incobotulinumtoxinA  (XEOMIN ) 100 units injection 100 Units   incobotulinumtoxinA  (XEOMIN ) 100 units injection  100 Units

## 2023-10-31 ENCOUNTER — Other Ambulatory Visit: Payer: Self-pay | Admitting: Neurology

## 2023-10-31 ENCOUNTER — Encounter: Payer: Self-pay | Admitting: Family Medicine

## 2023-10-31 ENCOUNTER — Ambulatory Visit: Admitting: Family Medicine

## 2023-10-31 VITALS — BP 150/78 | HR 79 | Temp 98.8°F | Ht 63.0 in | Wt 117.0 lb

## 2023-10-31 DIAGNOSIS — R443 Hallucinations, unspecified: Secondary | ICD-10-CM

## 2023-10-31 DIAGNOSIS — M1711 Unilateral primary osteoarthritis, right knee: Secondary | ICD-10-CM | POA: Diagnosis not present

## 2023-10-31 MED ORDER — HYALURONAN 30 MG/2ML IX SOSY
30.0000 mg | PREFILLED_SYRINGE | Freq: Once | INTRA_ARTICULAR | Status: AC
  Administered 2023-10-31: 30 mg via INTRA_ARTICULAR

## 2023-11-03 ENCOUNTER — Inpatient Hospital Stay: Payer: Medicare Other | Attending: Physician Assistant

## 2023-11-03 ENCOUNTER — Inpatient Hospital Stay: Payer: Medicare Other

## 2023-11-03 VITALS — BP 146/79 | HR 79 | Temp 97.7°F | Resp 17

## 2023-11-03 DIAGNOSIS — C50011 Malignant neoplasm of nipple and areola, right female breast: Secondary | ICD-10-CM | POA: Diagnosis not present

## 2023-11-03 DIAGNOSIS — Z880 Allergy status to penicillin: Secondary | ICD-10-CM | POA: Insufficient documentation

## 2023-11-03 DIAGNOSIS — M25569 Pain in unspecified knee: Secondary | ICD-10-CM | POA: Diagnosis not present

## 2023-11-03 DIAGNOSIS — Z79810 Long term (current) use of selective estrogen receptor modulators (SERMs): Secondary | ICD-10-CM | POA: Insufficient documentation

## 2023-11-03 DIAGNOSIS — Z923 Personal history of irradiation: Secondary | ICD-10-CM | POA: Insufficient documentation

## 2023-11-03 DIAGNOSIS — Z9049 Acquired absence of other specified parts of digestive tract: Secondary | ICD-10-CM | POA: Insufficient documentation

## 2023-11-03 DIAGNOSIS — Z90710 Acquired absence of both cervix and uterus: Secondary | ICD-10-CM | POA: Insufficient documentation

## 2023-11-03 DIAGNOSIS — Z85828 Personal history of other malignant neoplasm of skin: Secondary | ICD-10-CM | POA: Diagnosis not present

## 2023-11-03 DIAGNOSIS — D472 Monoclonal gammopathy: Secondary | ICD-10-CM | POA: Diagnosis not present

## 2023-11-03 DIAGNOSIS — M810 Age-related osteoporosis without current pathological fracture: Secondary | ICD-10-CM | POA: Insufficient documentation

## 2023-11-03 DIAGNOSIS — M1990 Unspecified osteoarthritis, unspecified site: Secondary | ICD-10-CM | POA: Insufficient documentation

## 2023-11-03 DIAGNOSIS — Z9841 Cataract extraction status, right eye: Secondary | ICD-10-CM | POA: Diagnosis not present

## 2023-11-03 DIAGNOSIS — R5383 Other fatigue: Secondary | ICD-10-CM | POA: Insufficient documentation

## 2023-11-03 DIAGNOSIS — Z79899 Other long term (current) drug therapy: Secondary | ICD-10-CM | POA: Diagnosis not present

## 2023-11-03 DIAGNOSIS — Z8719 Personal history of other diseases of the digestive system: Secondary | ICD-10-CM | POA: Insufficient documentation

## 2023-11-03 DIAGNOSIS — E785 Hyperlipidemia, unspecified: Secondary | ICD-10-CM | POA: Diagnosis not present

## 2023-11-03 DIAGNOSIS — Z9842 Cataract extraction status, left eye: Secondary | ICD-10-CM | POA: Insufficient documentation

## 2023-11-03 DIAGNOSIS — Z7982 Long term (current) use of aspirin: Secondary | ICD-10-CM | POA: Diagnosis not present

## 2023-11-03 DIAGNOSIS — G20A1 Parkinson's disease without dyskinesia, without mention of fluctuations: Secondary | ICD-10-CM | POA: Insufficient documentation

## 2023-11-03 DIAGNOSIS — Z90722 Acquired absence of ovaries, bilateral: Secondary | ICD-10-CM | POA: Diagnosis not present

## 2023-11-03 DIAGNOSIS — R0609 Other forms of dyspnea: Secondary | ICD-10-CM | POA: Diagnosis not present

## 2023-11-03 DIAGNOSIS — R0602 Shortness of breath: Secondary | ICD-10-CM | POA: Insufficient documentation

## 2023-11-03 LAB — CBC WITH DIFFERENTIAL (CANCER CENTER ONLY)
Abs Immature Granulocytes: 0.01 K/uL (ref 0.00–0.07)
Basophils Absolute: 0 K/uL (ref 0.0–0.1)
Basophils Relative: 1 %
Eosinophils Absolute: 0.1 K/uL (ref 0.0–0.5)
Eosinophils Relative: 2 %
HCT: 37.9 % (ref 36.0–46.0)
Hemoglobin: 12.4 g/dL (ref 12.0–15.0)
Immature Granulocytes: 0 %
Lymphocytes Relative: 35 %
Lymphs Abs: 1.9 K/uL (ref 0.7–4.0)
MCH: 32.4 pg (ref 26.0–34.0)
MCHC: 32.7 g/dL (ref 30.0–36.0)
MCV: 99 fL (ref 80.0–100.0)
Monocytes Absolute: 0.3 K/uL (ref 0.1–1.0)
Monocytes Relative: 5 %
Neutro Abs: 3.2 K/uL (ref 1.7–7.7)
Neutrophils Relative %: 57 %
Platelet Count: 237 K/uL (ref 150–400)
RBC: 3.83 MIL/uL — ABNORMAL LOW (ref 3.87–5.11)
RDW: 14 % (ref 11.5–15.5)
WBC Count: 5.5 K/uL (ref 4.0–10.5)
nRBC: 0 % (ref 0.0–0.2)

## 2023-11-03 LAB — CMP (CANCER CENTER ONLY)
ALT: <10 U/L (ref 0–44)
AST: 27 U/L (ref 15–41)
Albumin: 3.8 g/dL (ref 3.5–5.0)
Alkaline Phosphatase: 63 U/L (ref 38–126)
Anion gap: 4 — ABNORMAL LOW (ref 5–15)
BUN: 24 mg/dL — ABNORMAL HIGH (ref 8–23)
CO2: 33 mmol/L — ABNORMAL HIGH (ref 22–32)
Calcium: 9.5 mg/dL (ref 8.9–10.3)
Chloride: 104 mmol/L (ref 98–111)
Creatinine: 0.71 mg/dL (ref 0.44–1.00)
GFR, Estimated: >60 mL/min (ref >60)
Glucose, Bld: 100 mg/dL — ABNORMAL HIGH (ref 70–99)
Potassium: 4.1 mmol/L (ref 3.5–5.1)
Sodium: 141 mmol/L (ref 135–145)
Total Bilirubin: 0.4 mg/dL (ref 0.0–1.2)
Total Protein: 7.5 g/dL (ref 6.5–8.1)

## 2023-11-03 LAB — LACTATE DEHYDROGENASE: LDH: 175 U/L (ref 98–192)

## 2023-11-03 MED ORDER — DENOSUMAB 60 MG/ML ~~LOC~~ SOSY
60.0000 mg | PREFILLED_SYRINGE | Freq: Once | SUBCUTANEOUS | Status: AC
  Administered 2023-11-03: 60 mg via SUBCUTANEOUS
  Filled 2023-11-03: qty 1

## 2023-11-04 ENCOUNTER — Encounter: Payer: Self-pay | Admitting: Internal Medicine

## 2023-11-04 LAB — KAPPA/LAMBDA LIGHT CHAINS
Kappa free light chain: 137.3 mg/L — ABNORMAL HIGH (ref 3.3–19.4)
Kappa, lambda light chain ratio: 9.81 — ABNORMAL HIGH (ref 0.26–1.65)
Lambda free light chains: 14 mg/L (ref 5.7–26.3)

## 2023-11-04 LAB — BETA 2 MICROGLOBULIN, SERUM: Beta-2 Microglobulin: 1.8 mg/L (ref 0.6–2.4)

## 2023-11-05 LAB — IGG, IGA, IGM
IgA: 58 mg/dL — ABNORMAL LOW (ref 64–422)
IgG (Immunoglobin G), Serum: 1921 mg/dL — ABNORMAL HIGH (ref 586–1602)
IgM (Immunoglobulin M), Srm: 33 mg/dL (ref 26–217)

## 2023-11-05 NOTE — Progress Notes (Unsigned)
     Norval Slaven T. Kimia Finan, MD, CAQ Sports Medicine [facility] at [facility] [address] [place], [address]  Phone: [phone]  FAX: [phone]  [name] - 83 y.o. female  MRN [id number]  Date of Birth: 1940-10-24  Date: 11/07/2023  PCP: Cleatus Arlyss RAMAN, MD  Referral: Cleatus Arlyss RAMAN, MD  No chief complaint on file.   The patient presents for Orthovisc No. 2 injection  No diagnosis found.  Aspiration/Injection Procedure Note [name] [birth year] Date of procedure: 11/07/2023  Procedure: Large Joint Aspiration / Injection of Knee for Viscosupplementation, R Medication: Monovisc Indications: Pain  Procedure Details Patient verbally consented to procedure. Risks, benefits, and alternatives explained. Sterilely prepped with Chloraprep. Ethyl cholride used for anesthesia, then 7 cc of Lidocaine  1% used for anesthesia in the anteromedial position. Reprepped with Chloraprep.  Anteromedial approach used to inject joint without difficulty, injected with Orthovisc, 2 mL. (30 mg dosage)  No complications with procedure and tolerated well.   Medication Management during today's office visit: No orders of the defined types were placed in this encounter.  There are no discontinued medications.  Orders placed today for conditions managed today: No orders of the defined types were placed in this encounter.   Disposition: No follow-ups on file.  Dragon Medical One speech-to-text software was used for transcription in this dictation.  Possible transcriptional errors can occur using Animal nutritionist.   Signed,  [name]. Megan Nierenberg, MD   Outpatient Encounter Medications as of 11/07/2023  Medication Sig   aspirin  EC 81 MG tablet Take 1 tablet (81 mg total) by mouth daily. Swallow whole.   b complex vitamins capsule Take 1 capsule by mouth daily.   Carbidopa -Levodopa  ER (RYTARY ) 36.25-145 MG CPCR 1 capsule at 7 AM/11 AM/3 PM/7 PM    celecoxib  (CELEBREX ) 200 MG capsule Take 1 capsule (200 mg total) by mouth every morning.   clonazePAM  (KLONOPIN ) 0.5 MG tablet TAKE 1/2 TABLET BY MOUTH AT BEDTIME   cyanocobalamin  (CYANOCOBALAMIN ) 250 MCG tablet Take 1 tablet (250 mcg total) by mouth daily.   Denosumab  (PROLIA  [place]) Inject into the skin every 6 (six) months.   feeding supplement (ENSURE PLUS HIGH PROTEIN) LIQD Take 237 mLs by mouth 2 (two) times daily between meals.   methocarbamol  (ROBAXIN ) 500 MG tablet TAKE ONE TABLET BY MOUTH EVERY 8 HOURS AS NEEDED FOR MUSCLE SPASMS (SEDATION CAUTION)   Multiple Vitamin (MULTIVITAMIN) tablet Take 1 tablet by mouth daily.   NUPLAZID  34 MG CAPS TAKE 34 MG (1 CAPSULE) ONCE DAILY   omeprazole  (PRILOSEC ) 20 MG capsule TAKE ONE CAPSULE BY MOUTH ONCE DAILY   venlafaxine  XR (EFFEXOR -XR) 37.5 MG 24 hr capsule TAKE ONE CAPSULE BY MOUTH EVERY MORNING with breakfast   venlafaxine  XR (EFFEXOR -XR) 75 MG 24 hr capsule Take 1 capsule (75 mg total) by mouth daily. Take with 1 of the 37.5mg  capsules.   Facility-Administered Encounter Medications as of 11/07/2023  Medication   incobotulinumtoxinA  (XEOMIN ) 100 units injection 100 Units   incobotulinumtoxinA  (XEOMIN ) 100 units injection 100 Units   incobotulinumtoxinA  (XEOMIN ) 100 units injection 100 Units   incobotulinumtoxinA  (XEOMIN ) 100 units injection 100 Units

## 2023-11-07 ENCOUNTER — Ambulatory Visit: Admitting: Family Medicine

## 2023-11-07 ENCOUNTER — Other Ambulatory Visit: Payer: Self-pay | Admitting: Family Medicine

## 2023-11-07 ENCOUNTER — Encounter: Payer: Self-pay | Admitting: Family Medicine

## 2023-11-07 VITALS — BP 130/76 | HR 84 | Temp 97.3°F | Ht 63.0 in | Wt 118.5 lb

## 2023-11-07 DIAGNOSIS — M1711 Unilateral primary osteoarthritis, right knee: Secondary | ICD-10-CM | POA: Diagnosis not present

## 2023-11-07 MED ORDER — HYALURONAN 30 MG/2ML IX SOSY
30.0000 mg | PREFILLED_SYRINGE | Freq: Once | INTRA_ARTICULAR | Status: AC
  Administered 2023-11-07: 30 mg via INTRA_ARTICULAR

## 2023-11-08 NOTE — Progress Notes (Unsigned)
 [facility] OFFICE PROGRESS NOTE  Megan Arlyss RAMAN, MD [address] [place] [address]  DIAGNOSIS: 1) Node-negative breast cancer status post completion of radiation 05/06/2010 currently on tamoxifen  as well as prolia .  2) Monoclonal gammopathy of unknown significance on observation since 2007   PRIOR THERAPY: 1) status post right lumpectomy on 03/05/2010 and it showed invasive ductal carcinoma 0.9 CM, Positive for ER/PR and negative for HER-2, with ductal carcinoma in situ present and negative sentinel lymph node biopsies.  2) status post adjuvant radiotherapy completed in April of 2012  3) tamoxifen  20 mg by mouth daily started in 2012. [name]  CURRENT THERAPY:  1) Prolia  subcutaneous injection every 6 months, last given on 11/03/23  INTERVAL HISTORY: [name] 83 y.o. female returns  to the clinic today for an annual follow-up visit.  The patient was last seen by Megan. Sherrod and myself 1 year ago.  The patient is followed for MGUS.  She also has a history of breast cancer in 2012. She is not longer on tamoxifen .   She is feeling fairly well today without any concerning complaints except for knee pain. She received a steroid injection recently and she is expected to have a gel injection next week. She receives prolia  injections every 6 months, the most recent was last week.   Regarding her health over the past year, she has been struggling with arthritis. She was also hospitalized in May 2025 for encephalopathy. She also has Parkinson's.   In general, she has low energy. No fevers, chills, night sweats, or unintentional weight loss. No abnormal bleeding or bruising, such as epistaxis, gum bleeding, or blood in the stool or urine.  She experiences shortness of breath upon exertion, which is a chronic symptom. No unusual cough, hemoptysis, nausea, vomiting, or signs of infection such as skin infections or dysuria.  She recently had a repeat myeloma panel performed.  She  is here today for evaluation and to review her results.   MEDICAL HISTORY: Past Medical History:  Diagnosis Date   Allergy    SEASONAL   Anemia    Anemia-NOS / PMH, [name]   Arthritis    Breast cancer (HCC)    stage I right breast   Cataract    BILATERAL   Decreased vision    R eye   Esophageal Stricture    Hyperlipidemia    Monoclonal gammopathy    MVP (mitral valve prolapse)    Osteoporosis    Parkinson disease (HCC)    Skin cancer    basal cell L neck   Vertical diplopia     ALLERGIES:  is allergic to penicillins and lactose intolerance (gi).  MEDICATIONS:  Current Outpatient Medications  Medication Sig Dispense Refill   aspirin  EC 81 MG tablet Take 1 tablet (81 mg total) by mouth daily. Swallow whole.     b complex vitamins capsule Take 1 capsule by mouth daily. 30 capsule 0   Carbidopa -Levodopa  ER (RYTARY ) 36.25-145 MG CPCR 1 capsule at 7 AM/11 AM/3 PM/7 PM     celecoxib  (CELEBREX ) 200 MG capsule Take 1 capsule (200 mg total) by mouth every morning. 90 capsule 3   clonazePAM  (KLONOPIN ) 0.5 MG tablet TAKE 1/2 TABLET BY MOUTH AT BEDTIME 45 tablet 1   cyanocobalamin  (CYANOCOBALAMIN ) 250 MCG tablet Take 1 tablet (250 mcg total) by mouth daily.     Denosumab  (PROLIA  [name]) Inject into the skin every 6 (six) months.     feeding supplement (  ENSURE PLUS HIGH PROTEIN) LIQD Take 237 mLs by mouth 2 (two) times daily between meals.     methocarbamol  (ROBAXIN ) 500 MG tablet TAKE ONE TABLET BY MOUTH EVERY 8 HOURS AS NEEDED FOR MUSCLE SPASMS (SEDATION CAUTION) 30 tablet 2   Multiple Vitamin (MULTIVITAMIN) tablet Take 1 tablet by mouth daily.     NUPLAZID  34 MG CAPS TAKE 34 MG (1 CAPSULE) ONCE DAILY 90 capsule 0   omeprazole  (PRILOSEC ) 20 MG capsule TAKE ONE CAPSULE BY MOUTH ONCE DAILY 90 capsule 2   venlafaxine  XR (EFFEXOR -XR) 37.5 MG 24 hr capsule TAKE ONE CAPSULE BY MOUTH EVERY MORNING with breakfast 30 capsule 2   venlafaxine  XR (EFFEXOR -XR) 75 MG 24 hr capsule Take 1 capsule  (75 mg total) by mouth daily. Take with 1 of the 37.5mg  capsules. 90 capsule 3   Current Facility-Administered Medications  Medication Dose Route Frequency Provider Last Rate Last Admin   incobotulinumtoxinA  (XEOMIN ) 100 units injection 100 Units  100 Units Intramuscular Q90 days [name]   100 Units at 02/12/22 9170   incobotulinumtoxinA  (XEOMIN ) 100 units injection 100 Units  100 Units Intramuscular Q90 days [name]   100 Units at 11/12/22 1503   incobotulinumtoxinA  (XEOMIN ) 100 units injection 100 Units  100 Units Intramuscular Q90 days [name]   100 Units at 03/18/23 1015   incobotulinumtoxinA  (XEOMIN ) 100 units injection 100 Units  100 Units Intramuscular Q90 days [name]   100 Units at 07/22/23 1007    SURGICAL HISTORY:  Past Surgical History:  Procedure Laterality Date   APPENDECTOMY  2002   BONE BIOPSY  11/06/10   BREAST LUMPECTOMY  02/2010   right breast lumpectomy and sentinel node biopsy   CARDIAC CATHETERIZATION  2005   neg   CATARACT EXTRACTION, BILATERAL  2018   COLONOSCOPY     negative    HEMORRHOID SURGERY  1970's   SALPINGOOPHORECTOMY  2002   for benign growths   SHOULDER SURGERY Right    TOTAL ABDOMINAL HYSTERECTOMY W/ BILATERAL SALPINGOOPHORECTOMY  83 yrs old   for pain,prolapse ; G3 P2   UPPER GASTROINTESTINAL ENDOSCOPY      REVIEW OF SYSTEMS:   Constitutional: Positive for fatigue. Negative for appetite change, chills, fever and unexpected weight change.  HENT: Negative for mouth sores, nosebleeds, sore throat and trouble swallowing.   Eyes: Negative for eye problems and icterus.  Respiratory: Stable dyspnea on exertion. Negative for cough, hemoptysis,  and wheezing.   Cardiovascular: Negative for chest pain and leg swelling.  Gastrointestinal: Negative for abdominal pain, constipation, diarrhea, nausea and vomiting.  Genitourinary: Negative for bladder incontinence, difficulty urinating, dysuria, frequency and  hematuria.   Musculoskeletal: Positive for knee pain. Negative for back pain, gait problem, neck pain and neck stiffness.  Skin: Negative for itching and rash.  Neurological: Positive for tremor. Negative for dizziness, extremity weakness, gait problem, headaches, light-headedness and seizures.  Hematological: Negative for adenopathy. Does not bruise/bleed easily.  Psychiatric/Behavioral: Negative for confusion, depression and sleep disturbance. The patient is not nervous/anxious.       PHYSICAL EXAMINATION:  Blood pressure (!) 161/80, pulse 81, temperature (!) 97.2 F (36.2 C), temperature source Temporal, resp. rate 16, height 5' 3 (1.6 m), weight 118 lb (53.5 kg), SpO2 99%.  ECOG PERFORMANCE STATUS: 2  Physical Exam  Constitutional: Oriented to person, place, and time and thin appearing female, and in no distress.  HENT:  Head: Normocephalic and atraumatic.  Mouth/Throat: Oropharynx  is clear and moist. No oropharyngeal exudate.  Eyes: Conjunctivae are normal. Right eye exhibits no discharge. Left eye exhibits no discharge. No scleral icterus.  Neck: Normal range of motion. Neck supple.  Cardiovascular: Normal rate, regular rhythm, normal heart sounds and intact distal pulses.   Pulmonary/Chest: Effort normal and breath sounds normal. No respiratory distress. No wheezes. No rales.  Abdominal: Soft. Bowel sounds are normal. Exhibits no distension and no mass. There is no tenderness.  Musculoskeletal: Normal range of motion. Exhibits no edema.  Lymphadenopathy:    No cervical adenopathy.  Neurological: Positive for tremor. Alert and oriented to person, place, and time. Positive for muscle wasting. Examined in the chair.  Skin: Skin is warm and dry. No rash noted. Not diaphoretic. No erythema. No pallor.  Psychiatric: Mood, memory and judgment normal.  Vitals reviewed.    LABORATORY DATA: Lab Results  Component Value Date   WBC 5.5 11/03/2023   HGB 12.4 11/03/2023   HCT 37.9  11/03/2023   MCV 99.0 11/03/2023   PLT 237 11/03/2023      Chemistry      Component Value Date/Time   NA 141 11/03/2023 0904   NA 141 10/13/2016 1141   K 4.1 11/03/2023 0904   K 3.6 10/13/2016 1141   CL 104 11/03/2023 0904   CL 107 03/21/2012 1501   CO2 33 (H) 11/03/2023 0904   CO2 27 10/13/2016 1141   BUN 24 (H) 11/03/2023 0904   BUN 20.0 10/13/2016 1141   CREATININE 0.71 11/03/2023 0904   CREATININE 0.71 07/22/2023 1447   CREATININE 0.8 10/13/2016 1141      Component Value Date/Time   CALCIUM 9.5 11/03/2023 0904   CALCIUM 9.5 10/13/2016 1141   ALKPHOS 63 11/03/2023 0904   ALKPHOS 50 10/13/2016 1141   AST 27 11/03/2023 0904   AST 25 10/13/2016 1141   ALT <10 11/03/2023 0904   ALT 17 10/13/2016 1141   BILITOT 0.4 11/03/2023 0904   BILITOT 0.27 10/13/2016 1141       RADIOGRAPHIC STUDIES:  No results found.   ASSESSMENT/PLAN:  This is a very pleasant 83 year old Caucasian female with a history of breast adenocarcinoma and MGUS.  She previously had a bone marrow biopsy and aspirate that showed kappa restricted plasmacytosis averaging 10 to 15%.  The findings were characteristic of smoldering multiple myeloma but the patient is asymptomatic with no endorgan damage.   She had a repeat myeloma panel performed.  She was seen by Megan. Sherrod today.  She has some increase in her IgG and kappa free light chain. Her labs look similar to October 2023.   Megan. Sherrod recommends she continue on observation.    Megan. Sherrod recommends that she continue on observation with repeat labs every 6 months and prolia . We will see her in 1 year about 1 week after her repeat labs and prolia  at that time.    We discontinued the tamoxifen  last year.   The patient was advised to call immediately if she has any concerning symptoms in the interval. The patient voices understanding of current disease status and treatment options and is in agreement with the current care plan. All questions were  answered. The patient knows to call the clinic with any problems, questions or concerns. We can certainly see the patient much sooner if necessary   Orders Placed This Encounter  Procedures   CBC with Differential (Cancer Center Only)    Standing Status:   Future    Expected Date:  10/31/2024    Expiration Date:   01/29/2025   CMP (Cancer Center only)    Standing Status:   Future    Expected Date:   10/31/2024    Expiration Date:   01/29/2025   Lactate dehydrogenase (LDH)    Standing Status:   Future    Expected Date:   10/31/2024    Expiration Date:   01/29/2025   Multiple Myeloma Panel (SPEP&IFE w/QIG)    Standing Status:   Future    Expected Date:   10/31/2024    Expiration Date:   01/29/2025   Kappa/lambda light chains    Standing Status:   Future    Expected Date:   10/31/2024    Expiration Date:   01/29/2025   Beta 2 microglobulin, serum    Standing Status:   Future    Expected Date:   10/31/2024    Expiration Date:   01/29/2025      Megan Megan L Diamonte Stavely, [name] 11/10/23  ADDENDUM: Hematology/Oncology Attending: I had a face-to-face encounter with the patient today.  I reviewed her records, lab and recommended her care plan.  This is a very pleasant 83 years old white female with history of monoclonal gammopathy of undetermined significance that was diagnosed in 2007 and has been in observation since that time.  She also has a history of breast cancer status post right lumpectomy followed by adjuvant radiotherapy followed by tamoxifen  for 10 years.  The patient is here today for evaluation with repeat myeloma panel. Her myeloma panel showed stable disease except for increase in the free kappa light chain.  The patient has a lot of other medical comorbidities and she does not have any endorgan failure.  I [name] not think she is a good candidate for any aggressive treatment.  We will continue to monitor her by observation and repeat myeloma panel in 1 year. She was advised to call  immediately if she has any other concerning symptoms in the interval. Disclaimer: This note was dictated with voice recognition software. Similar sounding words can inadvertently be transcribed and may be missed upon review. [name] Megan Sherrod, MD

## 2023-11-10 ENCOUNTER — Inpatient Hospital Stay: Payer: Medicare Other | Admitting: Physician Assistant

## 2023-11-10 VITALS — BP 161/80 | HR 81 | Temp 97.2°F | Resp 16 | Ht 63.0 in | Wt 118.0 lb

## 2023-11-10 DIAGNOSIS — M810 Age-related osteoporosis without current pathological fracture: Secondary | ICD-10-CM | POA: Diagnosis not present

## 2023-11-10 DIAGNOSIS — C50011 Malignant neoplasm of nipple and areola, right female breast: Secondary | ICD-10-CM | POA: Diagnosis not present

## 2023-11-10 DIAGNOSIS — M1990 Unspecified osteoarthritis, unspecified site: Secondary | ICD-10-CM | POA: Diagnosis not present

## 2023-11-10 DIAGNOSIS — D472 Monoclonal gammopathy: Secondary | ICD-10-CM | POA: Diagnosis not present

## 2023-11-10 DIAGNOSIS — M25569 Pain in unspecified knee: Secondary | ICD-10-CM | POA: Diagnosis not present

## 2023-11-10 DIAGNOSIS — G20A1 Parkinson's disease without dyskinesia, without mention of fluctuations: Secondary | ICD-10-CM | POA: Diagnosis not present

## 2023-11-13 NOTE — Progress Notes (Unsigned)
     Reiley Bertagnolli T. Adeoluwa Silvers, MD, CAQ Sports Medicine [facility] at [facility] [address] [place], [address]  Phone: [phone]  FAX: [phone]  [name] - 83 y.o. female  MRN [id number]  Date of Birth: 12-27-1940  Date: 11/14/2023  PCP: Cleatus Arlyss RAMAN, MD  Referral: Cleatus Arlyss RAMAN, MD  No chief complaint on file.   Patient is here for Orthovisc No. 3    ICD-10-CM   1. Primary osteoarthritis of right knee  M17.11      Aspiration/Injection Procedure Note [name] Nov 10, 1940 Date of procedure: 11/14/2023  Procedure: Large Joint Aspiration / Injection of Knee for Viscosupplementation, R Medication: Monovisc Indications: Pain  Procedure Details Patient verbally consented to procedure. Risks, benefits, and alternatives explained. Sterilely prepped with Chloraprep. Ethyl cholride used for anesthesia, then 7 cc of Lidocaine  1% used for anesthesia in the anteromedial position. Reprepped with Chloraprep.  Anteromedial approach used to inject joint without difficulty, injected with Orthovisc, 2 mL. (30 mg dosage)  No complications with procedure and tolerated well.   Medication Management during today's office visit: No orders of the defined types were placed in this encounter.  There are no discontinued medications.  Orders placed today for conditions managed today: No orders of the defined types were placed in this encounter.   Disposition: No follow-ups on file.  Dragon [employer] One speech-to-text software was used for transcription in this dictation.  Possible transcriptional errors can occur using Animal nutritionist.   Signed,  [name]. [name], MD   Outpatient Encounter Medications as of 11/14/2023  Medication Sig   aspirin  EC 81 MG tablet Take 1 tablet (81 mg total) by mouth daily. Swallow whole.   b complex vitamins capsule Take 1 capsule by mouth daily.   Carbidopa -Levodopa  ER (RYTARY ) 36.25-145 MG CPCR 1 capsule at  7 AM/11 AM/3 PM/7 PM   celecoxib  (CELEBREX ) 200 MG capsule Take 1 capsule (200 mg total) by mouth every morning.   clonazePAM  (KLONOPIN ) 0.5 MG tablet TAKE 1/2 TABLET BY MOUTH AT BEDTIME   cyanocobalamin  (CYANOCOBALAMIN ) 250 MCG tablet Take 1 tablet (250 mcg total) by mouth daily.   Denosumab  (PROLIA  [place]) Inject into the skin every 6 (six) months.   feeding supplement (ENSURE PLUS HIGH PROTEIN) LIQD Take 237 mLs by mouth 2 (two) times daily between meals.   methocarbamol  (ROBAXIN ) 500 MG tablet TAKE ONE TABLET BY MOUTH EVERY 8 HOURS AS NEEDED FOR MUSCLE SPASMS (SEDATION CAUTION)   Multiple Vitamin (MULTIVITAMIN) tablet Take 1 tablet by mouth daily.   NUPLAZID  34 MG CAPS TAKE 34 MG (1 CAPSULE) ONCE DAILY   omeprazole  (PRILOSEC ) 20 MG capsule TAKE ONE CAPSULE BY MOUTH ONCE DAILY   venlafaxine  XR (EFFEXOR -XR) 37.5 MG 24 hr capsule TAKE ONE CAPSULE BY MOUTH EVERY MORNING with breakfast   venlafaxine  XR (EFFEXOR -XR) 75 MG 24 hr capsule Take 1 capsule (75 mg total) by mouth daily. Take with 1 of the 37.5mg  capsules.   Facility-Administered Encounter Medications as of 11/14/2023  Medication   incobotulinumtoxinA  (XEOMIN ) 100 units injection 100 Units   incobotulinumtoxinA  (XEOMIN ) 100 units injection 100 Units   incobotulinumtoxinA  (XEOMIN ) 100 units injection 100 Units   incobotulinumtoxinA  (XEOMIN ) 100 units injection 100 Units

## 2023-11-14 ENCOUNTER — Encounter: Payer: Self-pay | Admitting: Family Medicine

## 2023-11-14 ENCOUNTER — Ambulatory Visit: Admitting: Family Medicine

## 2023-11-14 VITALS — BP 156/90 | HR 82 | Temp 98.2°F | Ht 63.0 in | Wt 120.0 lb

## 2023-11-14 DIAGNOSIS — M25561 Pain in right knee: Secondary | ICD-10-CM | POA: Diagnosis not present

## 2023-11-14 DIAGNOSIS — G8929 Other chronic pain: Secondary | ICD-10-CM

## 2023-11-14 DIAGNOSIS — Z23 Encounter for immunization: Secondary | ICD-10-CM

## 2023-11-14 DIAGNOSIS — M1711 Unilateral primary osteoarthritis, right knee: Secondary | ICD-10-CM

## 2023-11-14 MED ORDER — HYALURONAN 30 MG/2ML IX SOSY
30.0000 mg | PREFILLED_SYRINGE | Freq: Once | INTRA_ARTICULAR | Status: AC
  Administered 2023-11-14: 30 mg via INTRA_ARTICULAR

## 2023-11-14 NOTE — Patient Instructions (Signed)
 OK to take Celebrex  for pain and arthritis

## 2023-11-22 ENCOUNTER — Telehealth: Payer: Self-pay | Admitting: Pharmacy Technician

## 2023-11-22 ENCOUNTER — Telehealth: Payer: Self-pay | Admitting: Neurology

## 2023-11-22 NOTE — Telephone Encounter (Signed)
 Left  message for the after hour service on 11-22-23   Caller states her name is [name] calling from [employer] the patient rytarty  caps-ules have been approved for coverage effect date - 11-22-23  to  11-21-24

## 2023-11-22 NOTE — Telephone Encounter (Signed)
 Pharmacy Patient Advocate Encounter   Received notification from CoverMyMeds that prior authorization for RYTARY  36.25MG  is required/requested.   Insurance verification completed.   The patient is insured through [facility].   Per test claim: PA required; PA submitted to above mentioned insurance via Latent Key/confirmation #/[id number] [facility] Status is pending

## 2023-11-23 NOTE — Telephone Encounter (Signed)
 I left message on patient's voicemail Rx has been approved.

## 2023-11-24 ENCOUNTER — Other Ambulatory Visit: Payer: Self-pay | Admitting: Neurology

## 2023-11-24 DIAGNOSIS — G20B2 Parkinson's disease with dyskinesia, with fluctuations: Secondary | ICD-10-CM

## 2023-11-25 ENCOUNTER — Other Ambulatory Visit: Payer: Self-pay

## 2023-11-25 ENCOUNTER — Encounter: Payer: Self-pay | Admitting: Internal Medicine

## 2023-11-25 ENCOUNTER — Ambulatory Visit (INDEPENDENT_AMBULATORY_CARE_PROVIDER_SITE_OTHER): Admitting: Neurology

## 2023-11-25 ENCOUNTER — Other Ambulatory Visit (HOSPITAL_COMMUNITY): Payer: Self-pay

## 2023-11-25 DIAGNOSIS — Z23 Encounter for immunization: Secondary | ICD-10-CM | POA: Diagnosis not present

## 2023-11-25 DIAGNOSIS — K117 Disturbances of salivary secretion: Secondary | ICD-10-CM | POA: Diagnosis not present

## 2023-11-25 MED ORDER — COVID-19 MRNA VAC-TRIS(PFIZER) 30 MCG/0.3ML IM SUSY
0.3000 mL | PREFILLED_SYRINGE | Freq: Once | INTRAMUSCULAR | 0 refills | Status: AC
  Filled 2023-11-25: qty 0.3, 1d supply, fill #0

## 2023-11-25 MED ORDER — INCOBOTULINUMTOXINA 100 UNITS IM SOLR
100.0000 [IU] | INTRAMUSCULAR | Status: AC
  Administered 2023-11-25: 100 [IU] via INTRAMUSCULAR

## 2023-11-25 NOTE — Telephone Encounter (Signed)
 Pharmacy Patient Advocate Encounter  Received notification from [facility] that Prior Authorization for RYTARY  36.25MG  has been APPROVED from 10.28.25 to 10.28.26. Ran test claim, Copay is $0. This test claim was processed through [facility]- copay amounts may vary at other pharmacies due to pharmacy/plan contracts, or as the patient moves through the different stages of their insurance plan.   PA #/Case ID/Reference #: [id number]

## 2023-11-25 NOTE — Procedures (Signed)
 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  The patient was educated on the botulinum toxin the black blox warning and given a copy of the botox patient medication guide.  The patient understands that this warning states that there have been reported cases of the Botox extending beyond the injection site and creating adverse effects, similar to those of botulism. This included loss of strength, trouble walking, hoarseness, trouble saying words clearly, loss of bladder control, trouble breathing, trouble swallowing, diplopia, blurry vision and ptosis. Most of the distant spread of Botox was happening in patients, primarily children, who received medication for spasticity or for cervical dystonia. The patient expressed understanding and desire to proceed.   Injections  Location Left  Right Units Number of sites  Parotid (point 1 on picture) 30 30 60 1 per side  Submandibular (point 2) 20 20 40 1 per side  TOTAL UNITS:   100    Type of Toxin: Xeomin  Discarded Units: 0  Needle drawback with each injection was free of blood. Pt tolerated procedure well without complications.   Reinjection is anticipated in 4 months.

## 2023-12-01 ENCOUNTER — Ambulatory Visit
Admission: RE | Admit: 2023-12-01 | Discharge: 2023-12-01 | Disposition: A | Discharge status: Home / Self Care | Source: Ambulatory Visit | Attending: Family Medicine | Admitting: Family Medicine

## 2023-12-01 DIAGNOSIS — M1711 Unilateral primary osteoarthritis, right knee: Secondary | ICD-10-CM | POA: Diagnosis not present

## 2023-12-01 DIAGNOSIS — G8929 Other chronic pain: Secondary | ICD-10-CM

## 2023-12-01 DIAGNOSIS — M23221 Derangement of posterior horn of medial meniscus due to old tear or injury, right knee: Secondary | ICD-10-CM | POA: Diagnosis not present

## 2023-12-01 DIAGNOSIS — M25561 Pain in right knee: Secondary | ICD-10-CM | POA: Diagnosis not present

## 2023-12-05 ENCOUNTER — Ambulatory Visit: Payer: Self-pay | Admitting: Family Medicine

## 2023-12-05 ENCOUNTER — Telehealth: Payer: Self-pay | Admitting: Family Medicine

## 2023-12-05 DIAGNOSIS — M1711 Unilateral primary osteoarthritis, right knee: Secondary | ICD-10-CM

## 2023-12-05 NOTE — Telephone Encounter (Signed)
 Copied from CRM [id number]. Topic: Clinical - Lab/Test Results >> Dec 05, 2023 10:31 AM Amy B wrote: Reason for CRM: Patient requests a call back to discuss the results of her MRI.  [phone]

## 2023-12-06 NOTE — Telephone Encounter (Signed)
 See other phone note.  This was addressed by Dr. Watt and I appreciate his help.

## 2023-12-13 ENCOUNTER — Telehealth: Payer: Self-pay | Admitting: Neurology

## 2023-12-13 NOTE — Telephone Encounter (Signed)
 Pt's son Missy called in this morning and he stated that the pharmacy stated that Pt has reached the dollar amount for the co payment assistance. Missy stated that Pt needs to be re enrolled in the co payment assistance. Thanks

## 2023-12-13 NOTE — Telephone Encounter (Signed)
 Pt son called they need help with the nuplazid 

## 2023-12-14 NOTE — Telephone Encounter (Signed)
Paperwork started

## 2023-12-16 NOTE — Telephone Encounter (Signed)
 Florence Beer and he is looking into this case for me

## 2024-01-02 ENCOUNTER — Encounter: Payer: Self-pay | Admitting: Orthopaedic Surgery

## 2024-01-02 ENCOUNTER — Ambulatory Visit: Admitting: Orthopaedic Surgery

## 2024-01-02 ENCOUNTER — Other Ambulatory Visit: Payer: Self-pay

## 2024-01-02 DIAGNOSIS — M1711 Unilateral primary osteoarthritis, right knee: Secondary | ICD-10-CM | POA: Diagnosis not present

## 2024-01-02 DIAGNOSIS — M25561 Pain in right knee: Secondary | ICD-10-CM | POA: Diagnosis not present

## 2024-01-02 DIAGNOSIS — G8929 Other chronic pain: Secondary | ICD-10-CM

## 2024-01-02 NOTE — Progress Notes (Signed)
 The patient is a very pleasant 83 year old female who comes in with her son today to evaluate and treat known severe arthritis of her right knee.  There is a MRI of her right knee on the canopy system and we did get new x-rays today.  She does ambulate with a rolling walker.  She does have Parkinson disease as well as multiple myeloma that is stable.  She is followed by her primary care physician Dr. Cleatus as well as Dr. Evonnie from neurology and Dr. Gatha from hematology oncology.  She just had a steroid injection in the right knee back in October and that did not provide much relief at all.  I did review all of her past medical history and medications within epic.  At this point her right knee pain is daily and it is definitely affecting her mobility, her quality of life and her actives of daily living.  She does mobilize using a walker and is very slow to mobilize.  The pain is quite severe with the right knee.  On examination of her right knee is very stiff.  She has a flexion contracture of the right knee and pain throughout the arc of motion knee with a moderate effusion.  It is globally tender.  Plain films of the right knee today show bone-on-bone arthritis involving all 3 compartments with complete loss of the lateral joint space and patellofemoral joint with osteophytes in all 3 compartments.  There is also significant medial narrowing.  The MRI of the right knee shows significant subchondral edema in the weightbearing surface of the lateral femoral condyle, lateral tibial plateau and areas of full-thickness cartilage loss of the weightbearing surface.  We had a long and thorough discussion about knee replacement surgery.  I showed her and her son a knee replacement model and described what the surgery involves including the discussion of the risk and benefits of surgery and what to expect from an intraoperative and postoperative standpoint.  She said that she would like to reach out to her other  physicians to get their opinion of whether or not she should proceed with the surgery and understand that as well.  She does have our surgery scheduler's card and would be fine with scheduling her for a right total knee arthroplasty if she decides to have this done.  All questions and concerns were addressed and answered.

## 2024-01-12 ENCOUNTER — Telehealth: Payer: Self-pay

## 2024-01-12 NOTE — Telephone Encounter (Signed)
 Spoke with patient regarding Dr. Blas recommendations. Per Dr. Sherrod, if the orthopedic surgeon determines that the patient can tolerate the surgery and that it is indicated, he has no objections to proceeding. Patient verbalized understanding.

## 2024-01-12 NOTE — Telephone Encounter (Signed)
 Spoke with patient this morning in regards to Dr. Jeannett recommendation.  Patient states Is my bones able to take a knee replacement.  Advised patient that I would relay information to Dr. Sherrod and call back with his recommendations.  She voiced understanding.

## 2024-01-31 ENCOUNTER — Other Ambulatory Visit: Payer: Self-pay | Admitting: Neurology

## 2024-01-31 DIAGNOSIS — R443 Hallucinations, unspecified: Secondary | ICD-10-CM

## 2024-02-03 ENCOUNTER — Telehealth: Payer: Self-pay | Admitting: Orthopaedic Surgery

## 2024-02-03 ENCOUNTER — Telehealth: Payer: Self-pay

## 2024-02-03 NOTE — Telephone Encounter (Signed)
 Patient left voice mail inquiring about scheduling surgery.  I called patient back and left voice mail for patient to return my call.

## 2024-02-03 NOTE — Telephone Encounter (Signed)
 Pt is calling in stating that she would like to know if after April 17, 2024 would be to late for her to have her knee surgery.  Her son is stating that it would better for him due to his job. She stated that she is in a lot of pain and want to move forward with the surgery, but not really sure if March would be too late. She stated that she could not remember what Dr. Vernetta said about 6mos.  Pt would like to have a call back. She is aware of the 24-48 hours call return protocol.

## 2024-02-10 ENCOUNTER — Other Ambulatory Visit: Payer: Self-pay | Admitting: Family Medicine

## 2024-02-10 ENCOUNTER — Other Ambulatory Visit: Payer: Self-pay | Admitting: Neurology

## 2024-02-10 DIAGNOSIS — F330 Major depressive disorder, recurrent, mild: Secondary | ICD-10-CM

## 2024-02-14 ENCOUNTER — Other Ambulatory Visit: Payer: Self-pay

## 2024-02-14 DIAGNOSIS — K117 Disturbances of salivary secretion: Secondary | ICD-10-CM

## 2024-02-14 MED ORDER — MYOBLOC 5000 UNIT/ML IM SOLN
INTRAMUSCULAR | 3 refills | Status: AC
  Filled 2024-02-14: qty 1, fill #0

## 2024-03-30 ENCOUNTER — Ambulatory Visit: Admitting: Neurology

## 2024-04-03 ENCOUNTER — Ambulatory Visit: Admitting: Neurology

## 2024-05-10 ENCOUNTER — Inpatient Hospital Stay

## 2024-05-10 ENCOUNTER — Inpatient Hospital Stay: Attending: Physician Assistant

## 2024-11-08 ENCOUNTER — Inpatient Hospital Stay: Attending: Physician Assistant

## 2024-11-15 ENCOUNTER — Inpatient Hospital Stay

## 2024-11-15 ENCOUNTER — Inpatient Hospital Stay: Admitting: Internal Medicine
